# Patient Record
Sex: Male | Born: 1955
Health system: Southern US, Community
[De-identification: ages and names within clinical notes are randomized; demographics above are authoritative.]

## PROBLEM LIST (undated history)

## (undated) DIAGNOSIS — M1990 Unspecified osteoarthritis, unspecified site: Secondary | ICD-10-CM

## (undated) DIAGNOSIS — A4901 Methicillin susceptible Staphylococcus aureus infection, unspecified site: Secondary | ICD-10-CM

## (undated) DIAGNOSIS — M4800 Spinal stenosis, site unspecified: Secondary | ICD-10-CM

## (undated) DIAGNOSIS — I499 Cardiac arrhythmia, unspecified: Secondary | ICD-10-CM

## (undated) DIAGNOSIS — G35D Multiple sclerosis, unspecified: Secondary | ICD-10-CM

## (undated) DIAGNOSIS — E0590 Thyrotoxicosis, unspecified without thyrotoxic crisis or storm: Secondary | ICD-10-CM

## (undated) DIAGNOSIS — L0291 Cutaneous abscess, unspecified: Secondary | ICD-10-CM

## (undated) DIAGNOSIS — M5126 Other intervertebral disc displacement, lumbar region: Secondary | ICD-10-CM

## (undated) DIAGNOSIS — N529 Male erectile dysfunction, unspecified: Secondary | ICD-10-CM

## (undated) DIAGNOSIS — G35 Multiple sclerosis: Secondary | ICD-10-CM

## (undated) HISTORY — DX: Male erectile dysfunction, unspecified: N52.9

## (undated) HISTORY — DX: Multiple sclerosis, unspecified: G35.D

## (undated) HISTORY — PX: TONSILLECTOMY: SUR1361

## (undated) HISTORY — DX: Cutaneous abscess, unspecified: L02.91

## (undated) HISTORY — DX: Multiple sclerosis: G35

## (undated) HISTORY — DX: Methicillin susceptible Staphylococcus aureus infection, unspecified site: A49.01

---
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
## 2001-02-06 ENCOUNTER — Encounter: Payer: Self-pay | Admitting: Emergency Medicine

## 2001-02-06 ENCOUNTER — Emergency Department (HOSPITAL_COMMUNITY): Admission: EM | Admit: 2001-02-06 | Discharge: 2001-02-06 | Payer: Self-pay | Admitting: Emergency Medicine

## 2001-08-07 ENCOUNTER — Encounter: Payer: Self-pay | Admitting: Family Medicine

## 2001-08-07 ENCOUNTER — Encounter: Admission: RE | Admit: 2001-08-07 | Discharge: 2001-08-07 | Payer: Self-pay | Admitting: Family Medicine

## 2010-07-14 ENCOUNTER — Emergency Department (HOSPITAL_COMMUNITY)
Admission: EM | Admit: 2010-07-14 | Discharge: 2010-07-14 | Payer: Self-pay | Source: Home / Self Care | Admitting: Emergency Medicine

## 2011-05-16 ENCOUNTER — Encounter (INDEPENDENT_AMBULATORY_CARE_PROVIDER_SITE_OTHER): Payer: Self-pay | Admitting: General Surgery

## 2011-05-22 ENCOUNTER — Ambulatory Visit (INDEPENDENT_AMBULATORY_CARE_PROVIDER_SITE_OTHER): Payer: BC Managed Care – PPO | Admitting: General Surgery

## 2011-05-22 ENCOUNTER — Encounter (INDEPENDENT_AMBULATORY_CARE_PROVIDER_SITE_OTHER): Payer: Self-pay | Admitting: General Surgery

## 2011-05-22 VITALS — BP 148/90 | HR 92 | Temp 98.2°F | Resp 18 | Ht 74.0 in | Wt 246.6 lb

## 2011-05-22 DIAGNOSIS — L02219 Cutaneous abscess of trunk, unspecified: Secondary | ICD-10-CM

## 2011-05-22 DIAGNOSIS — L02214 Cutaneous abscess of groin: Secondary | ICD-10-CM

## 2011-05-22 NOTE — Patient Instructions (Signed)
May shower. Clean area with soap (Dove) and water, then pat dry, then perform dressing care  Keep skin separated with corner of gauze & then cover with gauze. Changed at least daily.   Call for Temp>101.5, worsening redness or pain

## 2011-05-22 NOTE — Progress Notes (Signed)
Chief complaint: I'm here to have my abscess checked  History of present illness: 55 year old Caucasian male with multiple sclerosis comes in to have his right groin abscess rechecked. The patient states that he developed swelling, redness, and pain in his right groin last Monday evening. He went to his primary care physician's office last Tuesday and was told he had an ingrown hair which caused an abscess. He underwent incision and drainage in the office that day. He was placed on 2 weeks of doxycycline.  He states that it is still draining, mainly bloody fluid. He denies any fevers or chills in the past few days. He states that the pain in the area has improved. He states that the redness has gone down. Past Medical History  Diagnosis Date  . Multiple sclerosis   . Low testosterone   . Abscess     groin  . Erectile dysfunction   . Staph aureus infection    Past Surgical History  Procedure Date  . Finger surgery 1964  . Mass excision 1960  . Tonsillectomy    Allergies  Allergen Reactions  . Ancef (Cefazolin Sodium)    Current outpatient prescriptions:baclofen (LIORESAL) 10 MG tablet, Take 10 mg by mouth 3 (three) times daily.  , Disp: , Rfl: ;  doxycycline (VIBRA-TABS) 100 MG tablet, BID times 48H., Disp: , Rfl: ;  glatiramer (COPAXONE) 20 MG/ML injection, Inject 20 mg into the skin daily.  , Disp: , Rfl: ;  naproxen sodium (ANAPROX) 220 MG tablet, Take 440 mg by mouth 2 (two) times daily with a meal.  , Disp: , Rfl:  NUVIGIL 150 MG tablet, daily., Disp: , Rfl:   History  Substance Use Topics  . Smoking status: Never Smoker   . Smokeless tobacco: Not on file  . Alcohol Use: Yes   Family History  Problem Relation Age of Onset  . Diabetes Father    ROS: 8 point ROS performed and All systems negative except for what is mentioned in HPI  PE: BP 148/90  Pulse 92  Temp(Src) 98.2 F (36.8 C) (Temporal)  Resp 18  Ht 6\' 2"  (1.88 m)  Wt 246 lb 9.6 oz (111.857 kg)  BMI 31.66  kg/m2  Gen.-well-developed, well-nourished obese Caucasian male in no apparent distress Pulmonary-lungs are clear Cardiac-regular, rate and rhythm Abdomen-soft, nontender, nondistended Skin-right groin reveals a 1-1/2 cm incision. I cannot express any drainage. There is a fair amount of induration extending laterally approximately 5 cm x 3 cm from the incision. There is some trace cellulitis. There is no fluctuance. I probed the cavity with a Q-tip. It tracked for about 4 cm laterally. There does not appear to be any undrained fluid collection.  Assessment and plan: 55 year old Caucasian male with a history of multiple sclerosis status post incision and drainage of a right groin abscess.  It appears adequately drain for now. However there is still a fair amount of induration. I told the patient to finish his antibiotics which is about one week to go.  We discussed wound care instructions. I told the patient to wick the wound with a corner of a gauze to keep the skin separated.  He was given call for instructions.  I'll see him in 2 weeks.  Mary Sella. Andrey Campanile, MD, FACS General, Bariatric, & Minimally Invasive Surgery [facility], [place]

## 2011-06-08 ENCOUNTER — Ambulatory Visit (INDEPENDENT_AMBULATORY_CARE_PROVIDER_SITE_OTHER): Payer: BC Managed Care – PPO | Admitting: General Surgery

## 2011-06-08 ENCOUNTER — Encounter (INDEPENDENT_AMBULATORY_CARE_PROVIDER_SITE_OTHER): Payer: Self-pay | Admitting: General Surgery

## 2011-06-08 VITALS — BP 128/90 | HR 64 | Temp 97.4°F | Resp 20 | Ht 74.0 in | Wt 242.5 lb

## 2011-06-08 DIAGNOSIS — Z5189 Encounter for other specified aftercare: Secondary | ICD-10-CM

## 2011-06-08 DIAGNOSIS — G35D Multiple sclerosis, unspecified: Secondary | ICD-10-CM

## 2011-06-08 DIAGNOSIS — G35 Multiple sclerosis: Secondary | ICD-10-CM | POA: Insufficient documentation

## 2011-06-08 NOTE — Progress Notes (Signed)
Chief complaint: I'm here to have my abscess re-checked  History of present illness: 55 year old Caucasian male with multiple sclerosis comes in to have his right groin abscess rechecked. I last saw him on Nov 29. He has finished his antibiotics. He denies any drainage or redness or fevers or chills.  It is a little tender with certain movments. .   Past Medical History  Diagnosis Date  . Multiple sclerosis   . Low testosterone   . Abscess     groin  . Erectile dysfunction   . Staph aureus infection    Past Surgical History  Procedure Date  . Finger surgery 1964  . Mass excision 1960  . Tonsillectomy    Allergies  Allergen Reactions  . Ancef (Cefazolin Sodium)    Current outpatient prescriptions:baclofen (LIORESAL) 10 MG tablet, Take 10 mg by mouth 3 (three) times daily.  , Disp: , Rfl: ;  glatiramer (COPAXONE) 20 MG/ML injection, Inject 20 mg into the skin daily.  , Disp: , Rfl: ;  naproxen sodium (ANAPROX) 220 MG tablet, Take 440 mg by mouth 2 (two) times daily with a meal.  , Disp: , Rfl: ;  NUVIGIL 150 MG tablet, daily., Disp: , Rfl:   History  Substance Use Topics  . Smoking status: Never Smoker   . Smokeless tobacco: Never Used  . Alcohol Use: Yes   Family History  Problem Relation Age of Onset  . Diabetes Father    ROS: 8 point ROS performed and All systems negative except for what is mentioned in HPI  PE: BP 128/90  Pulse 64  Temp(Src) 97.4 F (36.3 C) (Temporal)  Resp 20  Ht 6\' 2"  (1.88 m)  Wt 242 lb 8 oz (109.997 kg)  BMI 31.14 kg/m2  Gen.-well-developed, well-nourished obese Caucasian male in no apparent distress Pulmonary-lungs are clear Cardiac-regular, rate and rhythm Abdomen-soft, nontender, nondistended Skin-right groin reveals a  Healed 1-1/2 cm incision. I cannot express any drainage. There is a little amount of induration extending laterally approximately 1cm from the incision. no cellulitis. There is no fluctuance.  Assessment and  plan: 55 year old Caucasian male with a history of multiple sclerosis status post incision and drainage of a right groin abscess.  It appears to have resolved.   F/u PRN  Mary Sella. Andrey Campanile, MD, FACS General, Bariatric, & Minimally Invasive Surgery [facility], [place]

## 2015-09-16 DIAGNOSIS — R208 Other disturbances of skin sensation: Secondary | ICD-10-CM | POA: Insufficient documentation

## 2015-09-16 DIAGNOSIS — R2681 Unsteadiness on feet: Secondary | ICD-10-CM | POA: Insufficient documentation

## 2015-09-16 DIAGNOSIS — R252 Cramp and spasm: Secondary | ICD-10-CM | POA: Insufficient documentation

## 2015-10-06 DIAGNOSIS — M50222 Other cervical disc displacement at C5-C6 level: Secondary | ICD-10-CM | POA: Insufficient documentation

## 2015-10-25 DIAGNOSIS — M545 Low back pain, unspecified: Secondary | ICD-10-CM | POA: Insufficient documentation

## 2015-11-09 DIAGNOSIS — M5127 Other intervertebral disc displacement, lumbosacral region: Secondary | ICD-10-CM | POA: Insufficient documentation

## 2015-12-07 ENCOUNTER — Telehealth: Payer: Self-pay | Admitting: Gastroenterology

## 2015-12-07 NOTE — Telephone Encounter (Signed)
Please call patient for colonoscopy screening. He is being referred by Dr [name] at [facility]. They are in the process of faxing over patients records. Their phone number, if needed, is [phone]

## 2015-12-09 NOTE — Telephone Encounter (Signed)
LVM for pt to return my call.

## 2015-12-16 NOTE — Telephone Encounter (Signed)
Left vm again for pt to return my call. Mailed letter.  

## 2016-05-22 ENCOUNTER — Telehealth: Payer: Self-pay | Admitting: Gastroenterology

## 2016-05-22 NOTE — Telephone Encounter (Signed)
[phone] colonoscopy

## 2016-06-04 NOTE — Telephone Encounter (Signed)
Tried contacting pt to schedule colonoscopy on 06/01/16. No vm available to leave message.

## 2016-06-22 ENCOUNTER — Other Ambulatory Visit: Payer: Self-pay

## 2016-06-22 ENCOUNTER — Telehealth: Payer: Self-pay

## 2016-06-22 NOTE — Telephone Encounter (Signed)
Gastroenterology Pre-Procedure Review  Request Date:  Requesting Physician: Dr.   PATIENT REVIEW QUESTIONS: The patient responded to the following health history questions as indicated:    1. Are you having any GI issues? no 2. Do you have a personal history of Polyps? no 3. Do you have a family history of Colon Cancer or Polyps? no 4. Diabetes Mellitus? no 5. Joint replacements in the past 12 months?no 6. Major health problems in the past 3 months?no 7. Any artificial heart valves, MVP, or defibrillator?no    MEDICATIONS & ALLERGIES:    Patient reports the following regarding taking any anticoagulation/antiplatelet therapy:   Plavix, Coumadin, Eliquis, Xarelto, Lovenox, Pradaxa, Brilinta, or Effient? no Aspirin? no  Patient confirms/reports the following medications:  Current Outpatient Prescriptions  Medication Sig Dispense Refill  . baclofen (LIORESAL) 10 MG tablet Take 10 mg by mouth 3 (three) times daily.      Marland Kitchen glatiramer (COPAXONE) 20 MG/ML injection Inject 20 mg into the skin daily.      . naproxen sodium (ANAPROX) 220 MG tablet Take 440 mg by mouth 2 (two) times daily with a meal.      . NUVIGIL 150 MG tablet daily.     No current facility-administered medications for this visit.     Patient confirms/reports the following allergies:  Allergies  Allergen Reactions  . Ancef [Cefazolin Sodium]     No orders of the defined types were placed in this encounter.   AUTHORIZATION INFORMATION Primary Insurance: 1D#: Group #:  Secondary Insurance: 1D#: Group #:  SCHEDULE INFORMATION: Date: 07/04/16 Time: Location: [place]

## 2016-06-22 NOTE — Telephone Encounter (Signed)
Pt scheduled for a screening colonoscopy at [facility] on 07/04/16 with Wohl. Please precert.

## 2016-06-26 ENCOUNTER — Telehealth: Payer: Self-pay

## 2016-06-26 NOTE — Telephone Encounter (Signed)
Your Notification/Prior Authorization was received on 06/26/2016 and will be processed but a Notification/Prior Authorization Number could not be assigned at this time. Please do not resubmit this notification/prior authorization. Please print this page for your records.

## 2016-06-27 ENCOUNTER — Encounter: Payer: Self-pay | Admitting: *Deleted

## 2016-07-02 ENCOUNTER — Telehealth: Payer: Self-pay | Admitting: Gastroenterology

## 2016-07-02 NOTE — Telephone Encounter (Signed)
Has colonoscopy Wed but hasn't receive any instruction or RX. Please call patient

## 2016-07-02 NOTE — Telephone Encounter (Signed)
The notification/prior authorization case information was transmitted on 07/02/2016 at 10:30 AM CST. The notification/prior authorization reference number is [id number]. Please print this page for your records.  Your Notification/Prior Authorization submission has been Approved and no further action is required for this request. Please note that it may take a few days for the procedure coverage status to be updated and viewable via the Notification/Prior Authorization Status transaction on this website.

## 2016-07-03 NOTE — Discharge Instructions (Signed)

## 2016-07-04 ENCOUNTER — Ambulatory Visit
Admission: RE | Admit: 2016-07-04 | Discharge: 2016-07-04 | Disposition: A | Discharge status: Home / Self Care | Payer: Medicare Other | Source: Ambulatory Visit | Attending: Gastroenterology | Admitting: Gastroenterology

## 2016-07-04 ENCOUNTER — Encounter
Admission: RE | Disposition: A | Discharge status: Home / Self Care | Payer: Self-pay | Source: Ambulatory Visit | Attending: Gastroenterology

## 2016-07-04 ENCOUNTER — Ambulatory Visit: Payer: Medicare Other | Admitting: Anesthesiology

## 2016-07-04 DIAGNOSIS — Z1211 Encounter for screening for malignant neoplasm of colon: Secondary | ICD-10-CM | POA: Diagnosis not present

## 2016-07-04 DIAGNOSIS — D122 Benign neoplasm of ascending colon: Secondary | ICD-10-CM

## 2016-07-04 DIAGNOSIS — K635 Polyp of colon: Secondary | ICD-10-CM

## 2016-07-04 DIAGNOSIS — Z79899 Other long term (current) drug therapy: Secondary | ICD-10-CM | POA: Diagnosis not present

## 2016-07-04 DIAGNOSIS — D125 Benign neoplasm of sigmoid colon: Secondary | ICD-10-CM | POA: Insufficient documentation

## 2016-07-04 DIAGNOSIS — Z87891 Personal history of nicotine dependence: Secondary | ICD-10-CM | POA: Insufficient documentation

## 2016-07-04 DIAGNOSIS — G35 Multiple sclerosis: Secondary | ICD-10-CM | POA: Insufficient documentation

## 2016-07-04 DIAGNOSIS — K640 First degree hemorrhoids: Secondary | ICD-10-CM | POA: Diagnosis not present

## 2016-07-04 DIAGNOSIS — M1990 Unspecified osteoarthritis, unspecified site: Secondary | ICD-10-CM | POA: Insufficient documentation

## 2016-07-04 DIAGNOSIS — K621 Rectal polyp: Secondary | ICD-10-CM

## 2016-07-04 HISTORY — DX: Unspecified osteoarthritis, unspecified site: M19.90

## 2016-07-04 HISTORY — PX: POLYPECTOMY: SHX5525

## 2016-07-04 HISTORY — DX: Other intervertebral disc displacement, lumbar region: M51.26

## 2016-07-04 HISTORY — PX: COLONOSCOPY WITH PROPOFOL: SHX5780

## 2016-07-04 SURGERY — COLONOSCOPY WITH PROPOFOL
Anesthesia: Monitor Anesthesia Care | Wound class: Contaminated

## 2016-07-04 MED ORDER — SIMETHICONE 40 MG/0.6ML PO SUSP
ORAL | Status: DC | PRN
  Administered 2016-07-04: 08:00:00

## 2016-07-04 MED ORDER — LACTATED RINGERS IV SOLN
INTRAVENOUS | Status: DC
  Administered 2016-07-04: 08:00:00 via INTRAVENOUS

## 2016-07-04 MED ORDER — LIDOCAINE HCL (CARDIAC) 20 MG/ML IV SOLN
INTRAVENOUS | Status: DC | PRN
  Administered 2016-07-04: 50 mg via INTRAVENOUS

## 2016-07-04 MED ORDER — PROPOFOL 10 MG/ML IV BOLUS
INTRAVENOUS | Status: DC | PRN
  Administered 2016-07-04 (×5): 50 mg via INTRAVENOUS
  Administered 2016-07-04: 100 mg via INTRAVENOUS
  Administered 2016-07-04 (×2): 50 mg via INTRAVENOUS

## 2016-07-04 SURGICAL SUPPLY — 23 items
CANISTER SUCT 1200ML W/VALVE (MISCELLANEOUS) ×3 IMPLANT
CLIP HMST 235XBRD CATH ROT (MISCELLANEOUS) IMPLANT
CLIP RESOLUTION 360 11X235 (MISCELLANEOUS)
FCP ESCP3.2XJMB 240X2.8X (MISCELLANEOUS)
FORCEPS BIOP RAD 4 LRG CAP 4 (CUTTING FORCEPS) IMPLANT
FORCEPS BIOP RJ4 240 W/NDL (MISCELLANEOUS)
FORCEPS ESCP3.2XJMB 240X2.8X (MISCELLANEOUS) IMPLANT
GOWN CVR UNV OPN BCK APRN NK (MISCELLANEOUS) ×4 IMPLANT
GOWN ISOL THUMB LOOP REG UNIV (MISCELLANEOUS) ×2
INJECTOR VARIJECT VIN23 (MISCELLANEOUS) IMPLANT
KIT DEFENDO VALVE AND CONN (KITS) IMPLANT
KIT ENDO PROCEDURE OLY (KITS) ×3 IMPLANT
MARKER SPOT ENDO TATTOO 5ML (MISCELLANEOUS) IMPLANT
PAD GROUND ADULT SPLIT (MISCELLANEOUS) IMPLANT
PROBE APC STR FIRE (PROBE) IMPLANT
RETRIEVER NET ROTH 2.5X230 LF (MISCELLANEOUS) IMPLANT
SNARE SHORT THROW 13M SML OVAL (MISCELLANEOUS) ×3 IMPLANT
SNARE SHORT THROW 30M LRG OVAL (MISCELLANEOUS) IMPLANT
SNARE SNG USE RND 15MM (INSTRUMENTS) IMPLANT
SPOT EX ENDOSCOPIC TATTOO (MISCELLANEOUS)
TRAP ETRAP POLY (MISCELLANEOUS) ×3 IMPLANT
VARIJECT INJECTOR VIN23 (MISCELLANEOUS)
WATER STERILE IRR 250ML POUR (IV SOLUTION) ×3 IMPLANT

## 2016-07-04 NOTE — Op Note (Signed)
[facility] Gastroenterology Patient Name: Dustin Gray Procedure Date: 07/04/2016 7:46 AM MRN: [id number] Account #: [id number] Date of Birth: 07-Apr-1956 Admit Type: Outpatient Age: 61 Room: [facility] OR ROOM 01 Gender: Male Note Status: Finalized Procedure:            Colonoscopy Indications:          Screening for colorectal malignant neoplasm Providers:            [name] MD, MD Referring MD:         [name], MD (Referring MD) Medicines:            Propofol per Anesthesia Complications:        No immediate complications. Procedure:            Pre-Anesthesia Assessment:                       - Prior to the procedure, a History and Physical was                        performed, and patient medications and allergies were                        reviewed. The patient's tolerance of previous                        anesthesia was also reviewed. The risks and benefits of                        the procedure and the sedation options and risks were                        discussed with the patient. All questions were                        answered, and informed consent was obtained. Prior                        Anticoagulants: The patient has taken no previous                        anticoagulant or antiplatelet agents. ASA Grade                        Assessment: II - A patient with mild systemic disease.                        After reviewing the risks and benefits, the patient was                        deemed in satisfactory condition to undergo the                        procedure.                       After obtaining informed consent, the colonoscope was                        passed under direct vision. Throughout the procedure,  the patient's blood pressure, pulse, and oxygen                        saturations were monitored continuously. The was                        introduced through the anus and advanced to the the                cecum, identified by appendiceal orifice and ileocecal                        valve. The colonoscopy was performed without                        difficulty. The patient tolerated the procedure well.                        The quality of the bowel preparation was excellent. Findings:      The perianal and digital rectal examinations were normal.      Four sessile polyps were found in the sigmoid colon. The polyps were 4       to 7 mm in size. These polyps were removed with a cold snare. Resection       and retrieval were complete.      A 4 mm polyp was found in the ascending colon. The polyp was sessile.       The polyp was removed with a cold snare. Resection and retrieval were       complete.      A 8 mm polyp was found in the rectum. The polyp was pedunculated. The       polyp was removed with a cold snare. Resection and retrieval were       complete.      Non-bleeding internal hemorrhoids were found during retroflexion. The       hemorrhoids were Grade I (internal hemorrhoids that do not prolapse). Impression:           - Four 4 to 7 mm polyps in the sigmoid colon, removed                        with a cold snare. Resected and retrieved.                       - One 4 mm polyp in the ascending colon, removed with a                        cold snare. Resected and retrieved.                       - One 8 mm polyp in the rectum, removed with a cold                        snare. Resected and retrieved.                       - Non-bleeding internal hemorrhoids. Recommendation:       - Discharge patient to home.                       - Resume previous diet.                       -  Continue present medications.                       - Await pathology results.                       - Repeat colonoscopy in 5 years if polyp adenoma and 10                        years if hyperplastic Procedure Code(s):    --- Professional ---                       [phone], Colonoscopy, flexible;  with removal of tumor(s),                        polyp(s), or other lesion(s) by snare technique Diagnosis Code(s):    --- Professional ---                       Z12.11, Encounter for screening for malignant neoplasm                        of colon                       D12.5, Benign neoplasm of sigmoid colon                       D12.2, Benign neoplasm of ascending colon                       K62.1, Rectal polyp CPT copyright 2016 American Medical Association. All rights reserved. The codes documented in this report are preliminary and upon coder review may  be revised to meet current compliance requirements. [name] MD, MD 07/04/2016 8:23:30 AM This report has been signed electronically. Number of Addenda: 0 Note Initiated On: 07/04/2016 7:46 AM Scope Withdrawal Time: 0 hours 10 minutes 15 seconds  Total Procedure Duration: 0 hours 16 minutes 29 seconds       [facility]

## 2016-07-04 NOTE — Transfer of Care (Signed)
Immediate Anesthesia Transfer of Care Note  Patient: Dustin Gray  Procedure(s) Performed: Procedure(s): COLONOSCOPY WITH PROPOFOL (N/A) POLYPECTOMY  Patient Location: PACU  Anesthesia Type: MAC  Level of Consciousness: awake, alert  and patient cooperative  Airway and Oxygen Therapy: Patient Spontanous Breathing and Patient connected to supplemental oxygen  Post-op Assessment: Post-op Vital signs reviewed, Patient's Cardiovascular Status Stable, Respiratory Function Stable, Patent Airway and No signs of Nausea or vomiting  Post-op Vital Signs: Reviewed and stable  Complications: No apparent anesthesia complications

## 2016-07-04 NOTE — Anesthesia Postprocedure Evaluation (Signed)
Anesthesia Post Note  Patient: Dustin Gray  Procedure(s) Performed: Procedure(s) (LRB): COLONOSCOPY WITH PROPOFOL (N/A) POLYPECTOMY  Patient location during evaluation: PACU Anesthesia Type: MAC Level of consciousness: awake and alert Pain management: pain level controlled Vital Signs Assessment: post-procedure vital signs reviewed and stable Respiratory status: spontaneous breathing, nonlabored ventilation, respiratory function stable and patient connected to nasal cannula oxygen Cardiovascular status: stable and blood pressure returned to baseline Anesthetic complications: no    Marshell Levan

## 2016-07-04 NOTE — Anesthesia Preprocedure Evaluation (Signed)
Anesthesia Evaluation  Patient identified by MRN, date of birth, ID band Patient awake    Airway Mallampati: II  TM Distance: >3 FB Neck ROM: Full    Dental   Pulmonary former smoker,    Pulmonary exam normal        Cardiovascular Normal cardiovascular exam     Neuro/Psych Well controlled MS    GI/Hepatic   Endo/Other    Renal/GU      Musculoskeletal   Abdominal   Peds  Hematology   Anesthesia Other Findings   Reproductive/Obstetrics                             Anesthesia Physical Anesthesia Plan  ASA: II  Anesthesia Plan: MAC   Post-op Pain Management:    Induction: Intravenous  Airway Management Planned:   Additional Equipment:   Intra-op Plan:   Post-operative Plan:   Informed Consent: I have reviewed the patients History and Physical, chart, labs and discussed the procedure including the risks, benefits and alternatives for the proposed anesthesia with the patient or authorized representative who has indicated his/her understanding and acceptance.     Plan Discussed with: CRNA  Anesthesia Plan Comments:         Anesthesia Quick Evaluation

## 2016-07-04 NOTE — H&P (Signed)
  [name], MD [address] [place], [place] [address] Phone: [phone] Fax : [phone]  Primary Care Physician:  Simona Huh, MD Primary Gastroenterologist:  Dr. Allen Norris  Pre-Procedure History & Physical: HPI:  Dustin Gray is a 61 y.o. male is here for a screening colonoscopy.   Past Medical History:  Diagnosis Date  . Abscess    groin  . Arthritis    lower spine  . Erectile dysfunction   . Low testosterone   . Lumbar herniated disc    L5  . Multiple sclerosis ([place])   . Staph aureus infection     Past Surgical History:  Procedure Laterality Date  . [place]  . MASS EXCISION  1960  . TONSILLECTOMY      Prior to Admission medications   Medication Sig Start Date End Date Taking? Authorizing Provider  baclofen (LIORESAL) 10 MG tablet Take 10 mg by mouth 3 (three) times daily.     Yes Historical Provider, MD  Dimethyl Fumarate (TECFIDERA) 240 MG CPDR Take by mouth 2 (two) times daily.   Yes Historical Provider, MD  gabapentin (NEURONTIN) 300 MG capsule Take 300 mg by mouth 3 (three) times daily. 300 mg Am and midday.  600 mg PM   Yes Historical Provider, MD  Multiple Vitamin (MULTIVITAMIN) tablet Take 1 tablet by mouth daily.   Yes Historical Provider, MD  naproxen sodium (ANAPROX) 220 MG tablet Take 440 mg by mouth 2 (two) times daily with a meal.     Yes Historical Provider, MD    Allergies as of 06/22/2016 - Review Complete 06/08/2011  Allergen Reaction Noted  . Ancef [cefazolin sodium]  05/16/2011    Family History  Problem Relation Age of Onset  . Diabetes Father     Social History   Social History  . Marital status: Legally Separated    Spouse name: N/A  . Number of children: N/A  . Years of education: N/A   Occupational History  . Not on file.   Social History Main Topics  . Smoking status: Former Research scientist (life sciences)  . Smokeless tobacco: Never Used     Comment: smoked as teenager  . Alcohol use Yes     Comment: 2  drinks/month  . Drug use: No  . Sexual activity: Not on file   Other Topics Concern  . Not on file   Social History Narrative  . No narrative on file    Review of Systems: See HPI, otherwise negative ROS  Physical Exam: BP (!) 154/96   Pulse (!) 111   Temp 98 F (36.7 C) (Tympanic)   Resp 18   Ht 6\' 2"  (1.88 m)   Wt 279 lb (126.6 kg)   SpO2 97%   BMI 35.82 kg/m  General:   Alert,  pleasant and cooperative in NAD Head:  Normocephalic and atraumatic. Neck:  Supple; no masses or thyromegaly. Lungs:  Clear throughout to auscultation.    Heart:  Regular rate and rhythm. Abdomen:  Soft, nontender and nondistended. Normal bowel sounds, without guarding, and without rebound.   Neurologic:  Alert and  oriented x4;  grossly normal neurologically.  Impression/Plan: Dustin Gray is now here to undergo a screening colonoscopy.  Risks, benefits, and alternatives regarding colonoscopy have been reviewed with the patient.  Questions have been answered.  All parties agreeable.

## 2016-07-04 NOTE — Anesthesia Procedure Notes (Signed)
Procedure Name: MAC Performed by: Yaden Seith Pre-anesthesia Checklist: Patient identified, Emergency Drugs available, Suction available, Timeout performed and Patient being monitored Patient Re-evaluated:Patient Re-evaluated prior to inductionOxygen Delivery Method: Nasal cannula Placement Confirmation: positive ETCO2     

## 2016-07-05 ENCOUNTER — Encounter: Payer: Self-pay | Admitting: Gastroenterology

## 2016-07-06 ENCOUNTER — Encounter: Payer: Self-pay | Admitting: Gastroenterology

## 2016-07-08 ENCOUNTER — Encounter: Payer: Self-pay | Admitting: Gastroenterology

## 2016-09-12 ENCOUNTER — Other Ambulatory Visit: Payer: Self-pay

## 2016-12-05 DIAGNOSIS — Z79899 Other long term (current) drug therapy: Secondary | ICD-10-CM | POA: Insufficient documentation

## 2017-12-12 DIAGNOSIS — H938X1 Other specified disorders of right ear: Secondary | ICD-10-CM | POA: Insufficient documentation

## 2017-12-12 DIAGNOSIS — H93291 Other abnormal auditory perceptions, right ear: Secondary | ICD-10-CM | POA: Insufficient documentation

## 2017-12-27 ENCOUNTER — Other Ambulatory Visit: Payer: Self-pay | Admitting: Student

## 2017-12-27 DIAGNOSIS — G8929 Other chronic pain: Secondary | ICD-10-CM

## 2017-12-27 DIAGNOSIS — M5442 Lumbago with sciatica, left side: Principal | ICD-10-CM

## 2018-01-09 ENCOUNTER — Other Ambulatory Visit: Payer: Self-pay | Admitting: Student

## 2018-01-09 ENCOUNTER — Ambulatory Visit
Admission: RE | Admit: 2018-01-09 | Discharge: 2018-01-09 | Disposition: A | Discharge status: Home / Self Care | Payer: Medicare Other | Source: Ambulatory Visit | Attending: Student | Admitting: Student

## 2018-01-09 DIAGNOSIS — M5136 Other intervertebral disc degeneration, lumbar region: Secondary | ICD-10-CM | POA: Diagnosis not present

## 2018-01-09 DIAGNOSIS — M5440 Lumbago with sciatica, unspecified side: Secondary | ICD-10-CM

## 2018-01-09 DIAGNOSIS — M4807 Spinal stenosis, lumbosacral region: Secondary | ICD-10-CM | POA: Insufficient documentation

## 2018-01-09 DIAGNOSIS — M48061 Spinal stenosis, lumbar region without neurogenic claudication: Secondary | ICD-10-CM | POA: Diagnosis not present

## 2018-01-09 DIAGNOSIS — M5126 Other intervertebral disc displacement, lumbar region: Secondary | ICD-10-CM | POA: Diagnosis not present

## 2018-01-09 DIAGNOSIS — M5442 Lumbago with sciatica, left side: Secondary | ICD-10-CM | POA: Insufficient documentation

## 2018-01-09 DIAGNOSIS — G8929 Other chronic pain: Secondary | ICD-10-CM | POA: Insufficient documentation

## 2018-01-09 DIAGNOSIS — Z8669 Personal history of other diseases of the nervous system and sense organs: Principal | ICD-10-CM

## 2018-01-09 DIAGNOSIS — G35 Multiple sclerosis: Secondary | ICD-10-CM

## 2018-01-09 DIAGNOSIS — G959 Disease of spinal cord, unspecified: Secondary | ICD-10-CM

## 2018-01-23 ENCOUNTER — Ambulatory Visit
Admission: RE | Admit: 2018-01-23 | Discharge: 2018-01-23 | Disposition: A | Discharge status: Home / Self Care | Payer: Medicare Other | Source: Ambulatory Visit | Attending: Student | Admitting: Student

## 2018-01-23 DIAGNOSIS — G959 Disease of spinal cord, unspecified: Secondary | ICD-10-CM | POA: Insufficient documentation

## 2018-01-23 DIAGNOSIS — M4802 Spinal stenosis, cervical region: Secondary | ICD-10-CM | POA: Insufficient documentation

## 2018-01-23 DIAGNOSIS — G8929 Other chronic pain: Secondary | ICD-10-CM | POA: Diagnosis present

## 2018-01-23 DIAGNOSIS — M545 Low back pain: Secondary | ICD-10-CM | POA: Diagnosis present

## 2018-01-23 DIAGNOSIS — Z8669 Personal history of other diseases of the nervous system and sense organs: Secondary | ICD-10-CM | POA: Insufficient documentation

## 2018-01-23 DIAGNOSIS — G35 Multiple sclerosis: Secondary | ICD-10-CM

## 2018-01-23 DIAGNOSIS — M5440 Lumbago with sciatica, unspecified side: Secondary | ICD-10-CM

## 2018-01-23 MED ORDER — GADOBENATE DIMEGLUMINE 529 MG/ML IV SOLN
20.0000 mL | Freq: Once | INTRAVENOUS | Status: AC | PRN
  Administered 2018-01-23: 10 mL via INTRAVENOUS

## 2018-07-11 ENCOUNTER — Other Ambulatory Visit: Payer: Self-pay | Admitting: Neurology

## 2018-07-11 DIAGNOSIS — G35 Multiple sclerosis: Secondary | ICD-10-CM

## 2018-07-25 ENCOUNTER — Ambulatory Visit
Admission: RE | Admit: 2018-07-25 | Discharge: 2018-07-25 | Disposition: A | Discharge status: Home / Self Care | Payer: Medicare Other | Source: Ambulatory Visit | Attending: Neurology | Admitting: Neurology

## 2018-07-25 DIAGNOSIS — G35 Multiple sclerosis: Secondary | ICD-10-CM | POA: Diagnosis present

## 2018-12-25 DIAGNOSIS — Z6836 Body mass index (BMI) 36.0-36.9, adult: Secondary | ICD-10-CM | POA: Insufficient documentation

## 2018-12-25 DIAGNOSIS — M1712 Unilateral primary osteoarthritis, left knee: Secondary | ICD-10-CM | POA: Insufficient documentation

## 2018-12-25 DIAGNOSIS — M48061 Spinal stenosis, lumbar region without neurogenic claudication: Secondary | ICD-10-CM | POA: Insufficient documentation

## 2019-07-27 IMAGING — MR MR HEAD W/O CM
11 series · 45 of 48 positions shown · non-contrast
Comparison: 08/20/2017

CLINICAL DATA: Followup multiple sclerosis

EXAM:
MRI HEAD WITHOUT CONTRAST
TECHNIQUE: Multiplanar, multiecho pulse sequences of the brain and surrounding
structures were obtained without intravenous contrast.

[Series 3: DWI · axial · 3.0mm · 1.20mm/px · z∈[-70,+102]mm · 4 of 59 slices shown (1 of 4)]
[im 1/59]
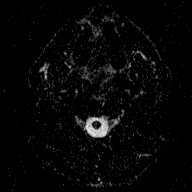
[im 20/59]
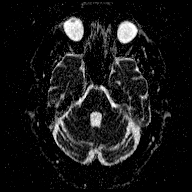
[im 39/59]
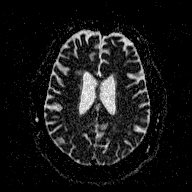
[im 59/59]
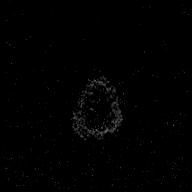

[Series 5: DWI · coronal · 3.0mm · 1.15mm/px · 4 of 53 slices shown (2 of 4)]
[im 1/53]
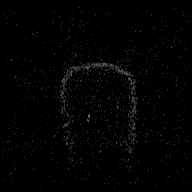
[im 18/53]
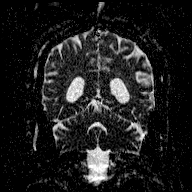
[im 35/53]
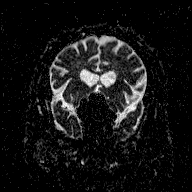
[im 53/53]
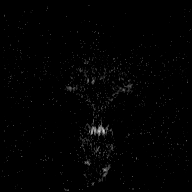

[Series 6: T1 · sagittal · 5.0mm · 0.45mm/px · 2 of 25 slices shown (1 of 2)]
[im 1/25]
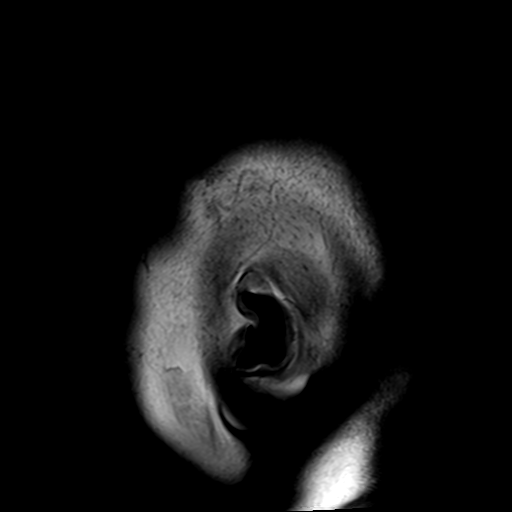
[im 25/25]
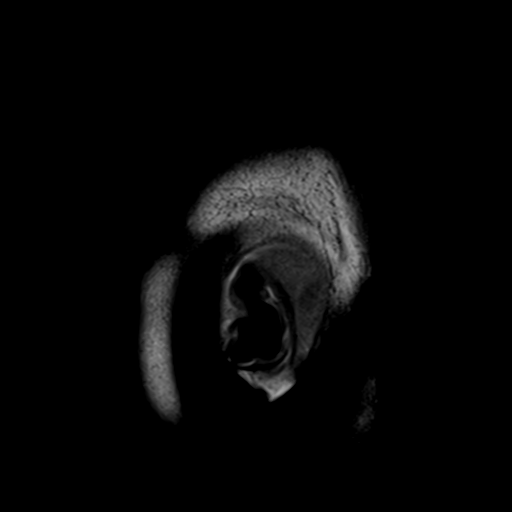

[Series 7: T2 · axial · 5.0mm · 0.72mm/px · z∈[-71,+108]mm · 2 of 27 slices shown (1 of 3)]
[im 1/27]
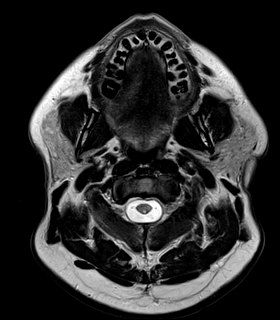
[im 27/27]
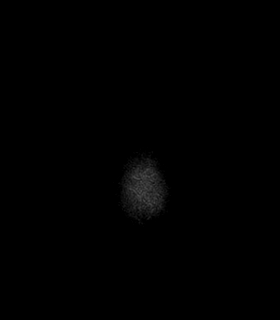

[Series 8: FLAIR · axial · 3.0mm · 0.45mm/px · z∈[-67,+104]mm · 5 of 59 slices shown (1 of 2)]
[im 1/59]
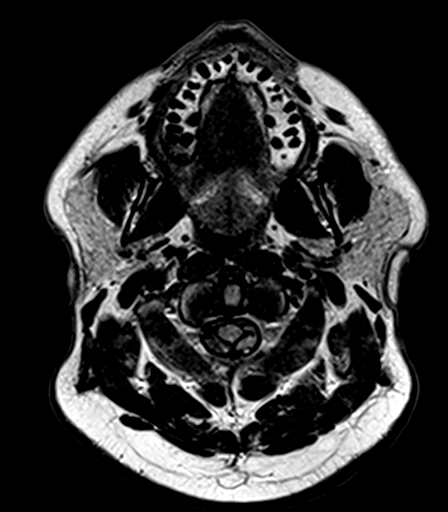
[im 15/59]
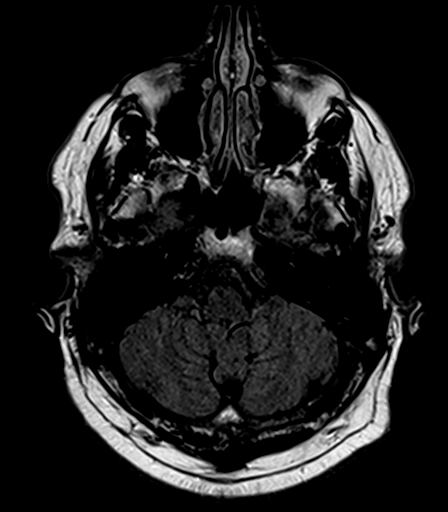
[im 30/59]
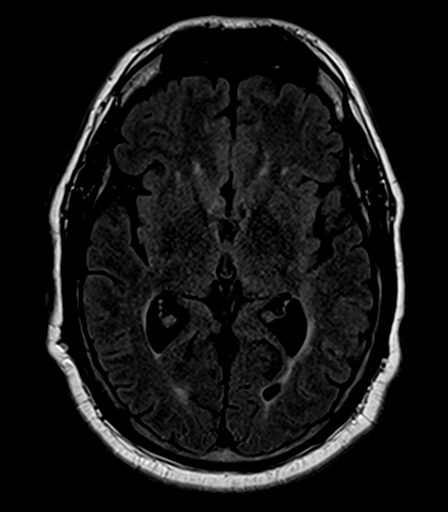
[im 44/59]
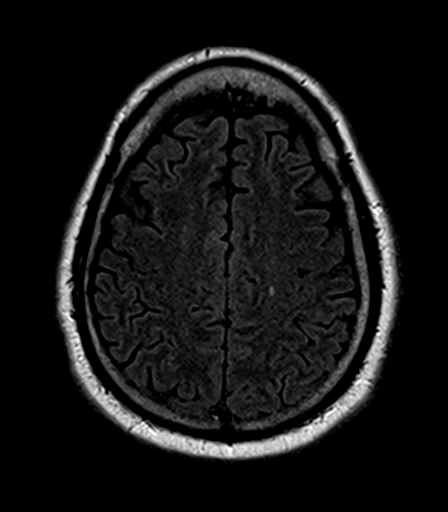
[im 59/59]
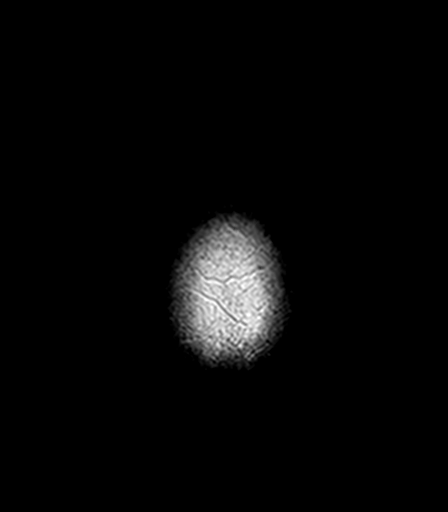

[Series 9: T2 · axial · 5.0mm · 0.72mm/px · z∈[-71,+108]mm · 2 of 27 slices shown (2 of 3)]
[im 1/27]
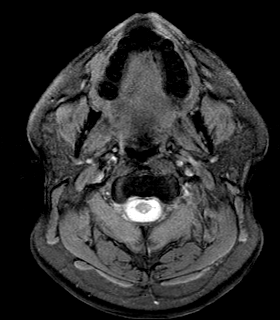
[im 27/27]
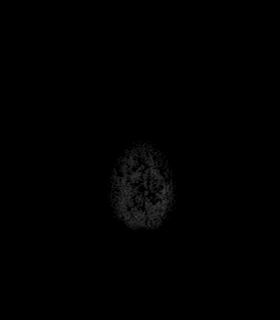

[Series 10: T1 · axial · 1.0mm · 1.00mm/px · z∈[-66,+107]mm · 11 of 176 slices shown (2 of 2)]
[im 1/176]
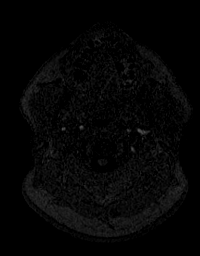
[im 14/176]
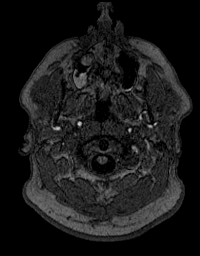
[im 27/176]
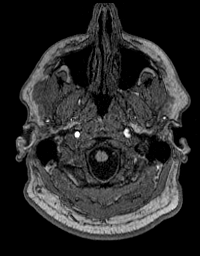
[im 41/176]
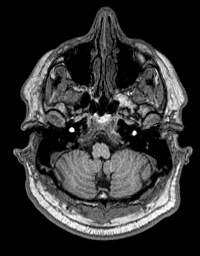
[im 54/176]
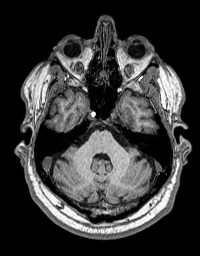
[im 68/176]
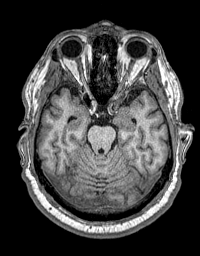
[im 81/176]
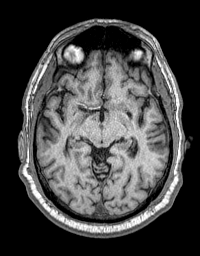
[im 95/176]
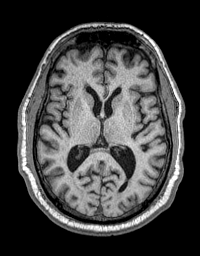
[im 122/176]
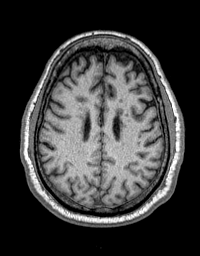
[im 149/176]
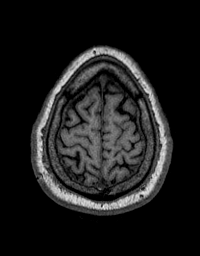
[im 176/176]
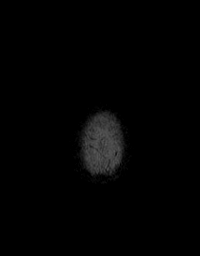

[Series 11: T2 · coronal · 5.0mm · 0.43mm/px · 3 of 33 slices shown (3 of 3)]
[im 1/33]
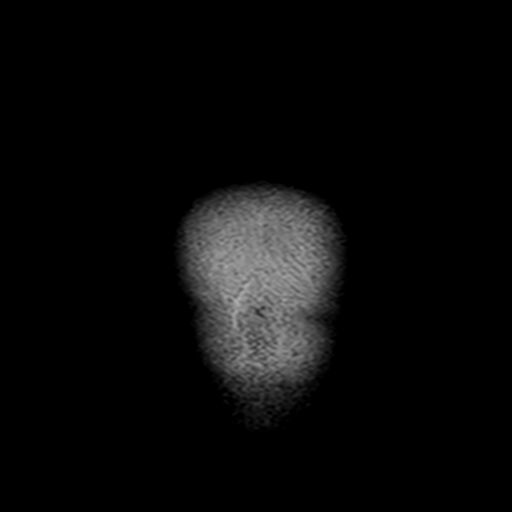
[im 17/33]
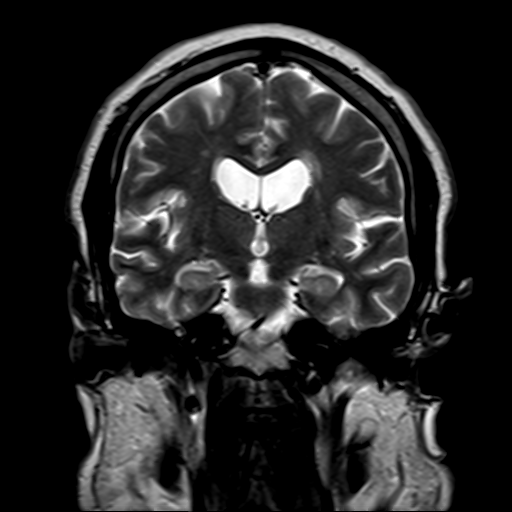
[im 33/33]
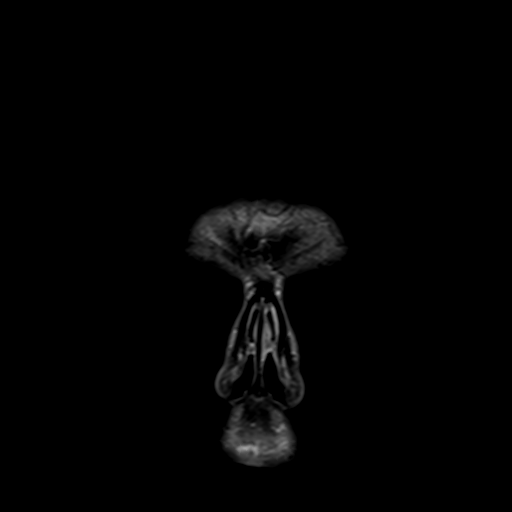

[Series 12: FLAIR · sagittal · 4.0mm · 0.45mm/px · 3 of 32 slices shown (2 of 2)]
[im 1/32]
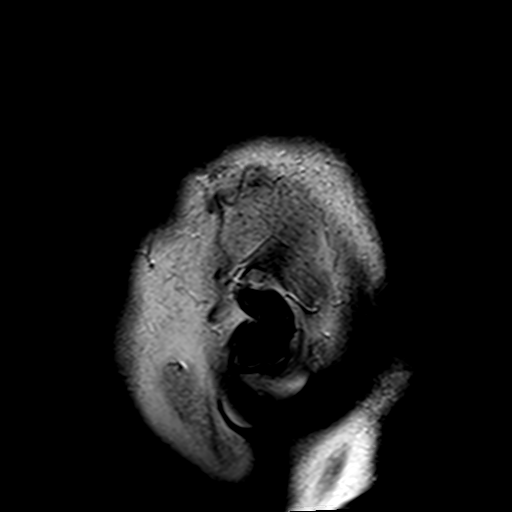
[im 16/32]
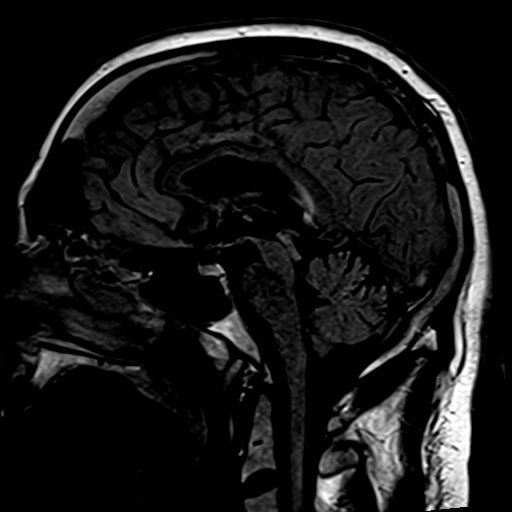
[im 32/32]
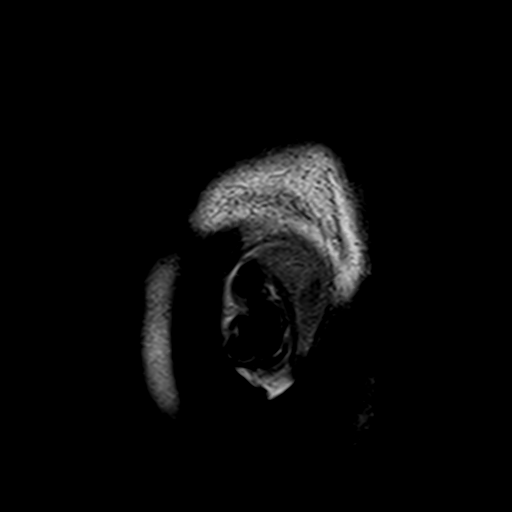

[Series 100: DWI · axial · 3.0mm · 1.20mm/px · z∈[-70,+102]mm · 5 of 59 slices shown (3 of 4)]
[im 1/59]
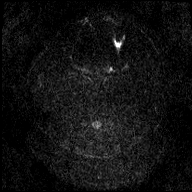
[im 15/59]
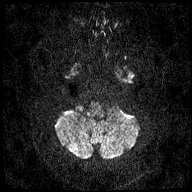
[im 30/59]
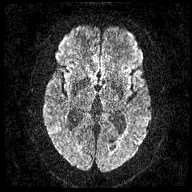
[im 44/59]
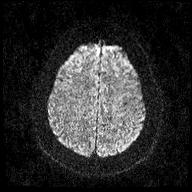
[im 59/59]
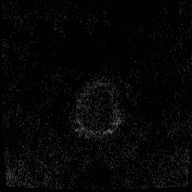

[Series 101: DWI · coronal · 3.0mm · 1.15mm/px · 4 of 53 slices shown (4 of 4)]
[im 1/53]
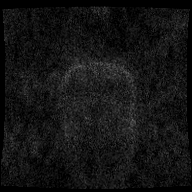
[im 18/53]
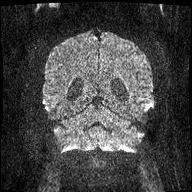
[im 35/53]
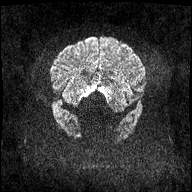
[im 53/53]
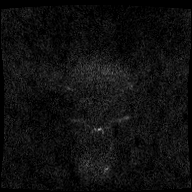

[45 of 48 positions shown; findings below may reference images not displayed]

FINDINGS: Brain: Diffusion imaging does not show any restricted diffusion. No
lesion is seen affecting the brainstem or cerebellum. Cerebral
hemispheres show numerous foci of abnormal T2 and FLAIR signal
within the deep and subcortical white matter of both hemispheres,
the vast majority of the lesions are unchanged since the previous
exam. There is a single new focus of abnormal T2 and FLAIR
subcortical white matter signal in the right posterior frontal lobe,
FLAIR axial image 36 and sagittal image 9, measuring maximal
dimension 8 mm. No evidence of mass, hemorrhage, hydrocephalus or
extra-axial collection.

Vascular: Major vessels at the base of the brain show flow.

Skull and upper cervical spine: Negative

Sinuses/Orbits: Clear/normal

Other: None
IMPRESSION: The only change since the study July 2017 is a single new
focus of T2 and FLAIR signal within the subcortical white matter of
the right posterior frontal lobe as above. Other multifocal
bilateral white matter foci consistent with MS involvement have not
changed.

## 2019-09-30 ENCOUNTER — Inpatient Hospital Stay
Admission: EM | Admit: 2019-09-30 | Discharge: 2019-10-03 | DRG: 308 | Disposition: A | Discharge status: Home / Self Care | Payer: Medicare PPO | Attending: Internal Medicine | Admitting: Internal Medicine

## 2019-09-30 ENCOUNTER — Other Ambulatory Visit: Payer: Self-pay

## 2019-09-30 ENCOUNTER — Emergency Department: Payer: Medicare PPO

## 2019-09-30 ENCOUNTER — Observation Stay: Payer: Medicare PPO

## 2019-09-30 ENCOUNTER — Encounter: Payer: Self-pay | Admitting: Emergency Medicine

## 2019-09-30 DIAGNOSIS — M5127 Other intervertebral disc displacement, lumbosacral region: Secondary | ICD-10-CM | POA: Diagnosis present

## 2019-09-30 DIAGNOSIS — R140 Abdominal distension (gaseous): Secondary | ICD-10-CM

## 2019-09-30 DIAGNOSIS — L03116 Cellulitis of left lower limb: Secondary | ICD-10-CM | POA: Diagnosis not present

## 2019-09-30 DIAGNOSIS — Z79899 Other long term (current) drug therapy: Secondary | ICD-10-CM

## 2019-09-30 DIAGNOSIS — Z20822 Contact with and (suspected) exposure to covid-19: Secondary | ICD-10-CM | POA: Diagnosis present

## 2019-09-30 DIAGNOSIS — Z87891 Personal history of nicotine dependence: Secondary | ICD-10-CM

## 2019-09-30 DIAGNOSIS — Z833 Family history of diabetes mellitus: Secondary | ICD-10-CM

## 2019-09-30 DIAGNOSIS — I4819 Other persistent atrial fibrillation: Secondary | ICD-10-CM | POA: Diagnosis not present

## 2019-09-30 DIAGNOSIS — I5031 Acute diastolic (congestive) heart failure: Secondary | ICD-10-CM | POA: Diagnosis present

## 2019-09-30 DIAGNOSIS — I272 Pulmonary hypertension, unspecified: Secondary | ICD-10-CM | POA: Diagnosis present

## 2019-09-30 DIAGNOSIS — G35 Multiple sclerosis: Secondary | ICD-10-CM | POA: Diagnosis present

## 2019-09-30 DIAGNOSIS — I4891 Unspecified atrial fibrillation: Secondary | ICD-10-CM | POA: Diagnosis not present

## 2019-09-30 DIAGNOSIS — E0500 Thyrotoxicosis with diffuse goiter without thyrotoxic crisis or storm: Secondary | ICD-10-CM

## 2019-09-30 DIAGNOSIS — E0590 Thyrotoxicosis, unspecified without thyrotoxic crisis or storm: Secondary | ICD-10-CM

## 2019-09-30 DIAGNOSIS — R600 Localized edema: Secondary | ICD-10-CM

## 2019-09-30 DIAGNOSIS — Z881 Allergy status to other antibiotic agents status: Secondary | ICD-10-CM

## 2019-09-30 LAB — CBC
HCT: 43.9 % (ref 39.0–52.0)
HCT: 44.5 % (ref 39.0–52.0)
Hemoglobin: 14.3 g/dL (ref 13.0–17.0)
Hemoglobin: 14.5 g/dL (ref 13.0–17.0)
MCH: 29.5 pg (ref 26.0–34.0)
MCH: 29.3 pg (ref 26.0–34.0)
MCHC: 32.6 g/dL (ref 30.0–36.0)
MCHC: 32.6 g/dL (ref 30.0–36.0)
MCV: 90.5 fL (ref 80.0–100.0)
MCV: 89.9 fL (ref 80.0–100.0)
Platelets: 213 K/uL (ref 150–400)
Platelets: 194 10*3/uL (ref 150–400)
RBC: 4.85 MIL/uL (ref 4.22–5.81)
RBC: 4.95 MIL/uL (ref 4.22–5.81)
RDW: 12.1 % (ref 11.5–15.5)
RDW: 12.2 % (ref 11.5–15.5)
WBC: 5.6 K/uL (ref 4.0–10.5)
WBC: 5.3 10*3/uL (ref 4.0–10.5)
nRBC: 0 % (ref 0.0–0.2)
nRBC: 0 % (ref 0.0–0.2)

## 2019-09-30 LAB — CREATININE, SERUM
Creatinine, Ser: 0.84 mg/dL (ref 0.61–1.24)
GFR calc Af Amer: >60 mL/min (ref >60)
GFR calc non Af Amer: >60 mL/min (ref >60)

## 2019-09-30 LAB — BASIC METABOLIC PANEL WITH GFR
Anion gap: 6 (ref 5–15)
BUN: 8 mg/dL (ref 8–23)
CO2: 26 mmol/L (ref 22–32)
Calcium: 9.3 mg/dL (ref 8.9–10.3)
Chloride: 109 mmol/L (ref 98–111)
Creatinine, Ser: 0.93 mg/dL (ref 0.61–1.24)
GFR calc Af Amer: >60 mL/min
GFR calc non Af Amer: >60 mL/min
Glucose, Bld: 128 mg/dL — ABNORMAL HIGH (ref 70–99)
Potassium: 4.5 mmol/L (ref 3.5–5.1)
Sodium: 141 mmol/L (ref 135–145)

## 2019-09-30 LAB — BRAIN NATRIURETIC PEPTIDE: B Natriuretic Peptide: 200 pg/mL — ABNORMAL HIGH (ref 0.0–100.0)

## 2019-09-30 LAB — URINALYSIS, ROUTINE W REFLEX MICROSCOPIC
Bilirubin Urine: NEGATIVE
Glucose, UA: NEGATIVE mg/dL
Hgb urine dipstick: NEGATIVE
Ketones, ur: 5 mg/dL — AB
Leukocytes,Ua: NEGATIVE
Nitrite: NEGATIVE
Protein, ur: NEGATIVE mg/dL
Specific Gravity, Urine: 1.018 (ref 1.005–1.030)
pH: 6 (ref 5.0–8.0)

## 2019-09-30 LAB — HIV ANTIBODY (ROUTINE TESTING W REFLEX): HIV Screen 4th Generation wRfx: NONREACTIVE

## 2019-09-30 LAB — TSH: TSH: <0.01 u[IU]/mL — ABNORMAL LOW (ref 0.350–4.500)

## 2019-09-30 LAB — TROPONIN I (HIGH SENSITIVITY)
Troponin I (High Sensitivity): 7 ng/L
Troponin I (High Sensitivity): 8 ng/L

## 2019-09-30 MED ORDER — SODIUM CHLORIDE 0.9% FLUSH
3.0000 mL | Freq: Two times a day (BID) | INTRAVENOUS | Status: DC
  Administered 2019-10-01 – 2019-10-03 (×5): 3 mL via INTRAVENOUS

## 2019-09-30 MED ORDER — BACLOFEN 10 MG PO TABS
10.0000 mg | ORAL_TABLET | Freq: Three times a day (TID) | ORAL | Status: DC
  Administered 2019-09-30 – 2019-10-03 (×8): 10 mg via ORAL
  Filled 2019-09-30 (×11): qty 1

## 2019-09-30 MED ORDER — ONDANSETRON HCL 4 MG/2ML IJ SOLN
4.0000 mg | Freq: Four times a day (QID) | INTRAMUSCULAR | Status: DC | PRN
  Administered 2019-10-03: 4 mg via INTRAVENOUS
  Filled 2019-09-30: qty 2

## 2019-09-30 MED ORDER — ACETAMINOPHEN 325 MG PO TABS: 650.0000 mg | ORAL_TABLET | Freq: Four times a day (QID) | ORAL | Status: DC | PRN

## 2019-09-30 MED ORDER — DILTIAZEM HCL 25 MG/5ML IV SOLN
10.0000 mg | Freq: Once | INTRAVENOUS | Status: AC
  Administered 2019-09-30: 10 mg via INTRAVENOUS
  Filled 2019-09-30: qty 5

## 2019-09-30 MED ORDER — CLINDAMYCIN HCL 150 MG PO CAPS
300.0000 mg | ORAL_CAPSULE | Freq: Four times a day (QID) | ORAL | Status: DC
  Administered 2019-09-30 – 2019-10-03 (×12): 300 mg via ORAL
  Filled 2019-09-30 (×14): qty 2

## 2019-09-30 MED ORDER — POLYETHYLENE GLYCOL 3350 17 G PO PACK: 17.0000 g | PACK | Freq: Every day | ORAL | Status: DC | PRN

## 2019-09-30 MED ORDER — DILTIAZEM HCL 60 MG PO TABS
30.0000 mg | ORAL_TABLET | Freq: Once | ORAL | Status: AC
  Administered 2019-09-30: 30 mg via ORAL
  Filled 2019-09-30: qty 1

## 2019-09-30 MED ORDER — GABAPENTIN 300 MG PO CAPS
600.0000 mg | ORAL_CAPSULE | Freq: Every day | ORAL | Status: DC
  Administered 2019-09-30 – 2019-10-02 (×3): 600 mg via ORAL
  Filled 2019-09-30 (×3): qty 2

## 2019-09-30 MED ORDER — DIMETHYL FUMARATE 240 MG PO CPDR
DELAYED_RELEASE_CAPSULE | Freq: Two times a day (BID) | ORAL | Status: DC
  Administered 2019-09-30: 240 mg via ORAL

## 2019-09-30 MED ORDER — FUROSEMIDE 10 MG/ML IJ SOLN
40.0000 mg | Freq: Two times a day (BID) | INTRAMUSCULAR | Status: DC
  Administered 2019-10-01: 40 mg via INTRAVENOUS
  Filled 2019-09-30: qty 4

## 2019-09-30 MED ORDER — ONDANSETRON HCL 4 MG PO TABS: 4.0000 mg | ORAL_TABLET | Freq: Four times a day (QID) | ORAL | Status: DC | PRN

## 2019-09-30 MED ORDER — ACETAMINOPHEN 650 MG RE SUPP: 650.0000 mg | Freq: Four times a day (QID) | RECTAL | Status: DC | PRN

## 2019-09-30 MED ORDER — GABAPENTIN 300 MG PO CAPS: 300.0000 mg | ORAL_CAPSULE | Freq: Three times a day (TID) | ORAL | Status: DC

## 2019-09-30 MED ORDER — DILTIAZEM HCL 25 MG/5ML IV SOLN
5.0000 mg | Freq: Once | INTRAVENOUS | Status: AC
  Administered 2019-09-30: 5 mg via INTRAVENOUS
  Filled 2019-09-30: qty 5

## 2019-09-30 MED ORDER — FUROSEMIDE 10 MG/ML IJ SOLN: 40.0000 mg | Freq: Every day | INTRAMUSCULAR | Status: DC

## 2019-09-30 MED ORDER — DILTIAZEM HCL-DEXTROSE 125-5 MG/125ML-% IV SOLN (PREMIX)
5.0000 mg/h | INTRAVENOUS | Status: DC
  Administered 2019-09-30: 5 mg/h via INTRAVENOUS
  Administered 2019-10-01: 15 mg/h via INTRAVENOUS
  Filled 2019-09-30 (×2): qty 125

## 2019-09-30 MED ORDER — GABAPENTIN 300 MG PO CAPS
300.0000 mg | ORAL_CAPSULE | Freq: Every morning | ORAL | Status: DC
  Administered 2019-10-01 – 2019-10-03 (×3): 300 mg via ORAL
  Filled 2019-09-30 (×3): qty 1

## 2019-09-30 MED ORDER — ENOXAPARIN SODIUM 40 MG/0.4ML ~~LOC~~ SOLN
40.0000 mg | SUBCUTANEOUS | Status: DC
  Administered 2019-10-01 (×2): 40 mg via SUBCUTANEOUS
  Filled 2019-09-30 (×2): qty 0.4

## 2019-09-30 NOTE — Consult Note (Signed)
Cardiology Consultation:   Patient ID: MCGREGOR MOA MRN: [id number]; DOB: 28-Apr-1956  Admit date: 09/30/2019 Date of Consult: 09/30/2019  Primary Care Provider: [name], MD Primary Cardiologist:[facility]- [place] rounding Primary Electrophysiologist:  None    Patient Profile:   Dustin Gray is a 64 y.o. male with a hx of multiple sclerosis, spinal stenosis, lymphedema who is being seen today for the evaluation of atrial fibrillation at the request of Dr. Jacqualine Code.  History of Present Illness:   Dustin Gray is a 64 year old male with history of multiple sclerosis, spinal stenosis, lymphedema who presents to the hospital due to shortness of breath.  Patient has noticed shortness of breath over the past 2 weeks.  Symptoms have roughly stayed the same.  He denies chest pain.  He went to a walk-in clinic due to worsening lower extremity edema and shortness of breath.  His heart rate was noted to be elevated and patient brought to the emergency room.  He denies any history of heart disease.  Endorses palpitations which started yesterday.  In the ED, EKG showed atrial fibrillation with rapid ventricular response heart rate 178 bpm.  Troponins were within normal limits.  Patient was given IV diltiazem, and started on diltiazem drip.   Past Medical History:  Diagnosis Date  . Abscess    groin  . Arthritis    lower spine  . Erectile dysfunction   . Low testosterone   . Lumbar herniated disc    L5  . Multiple sclerosis ([name]   . Staph aureus infection     Past Surgical History:  Procedure Laterality Date  . COLONOSCOPY WITH PROPOFOL N/A 07/04/2016   Procedure: COLONOSCOPY WITH PROPOFOL;  Surgeon: [name], MD;  Location: [place];  Service: Endoscopy;  Laterality: N/A;  . [place]  . MASS EXCISION  1960  . POLYPECTOMY  07/04/2016   Procedure: POLYPECTOMY;  Surgeon: [name], MD;  Location: [place];  Service: Endoscopy;;  . TONSILLECTOMY        Home Medications:  Prior to Admission medications   Medication Sig Start Date End Date Taking? Authorizing Provider  baclofen (LIORESAL) 10 MG tablet Take 10 mg by mouth 3 (three) times daily.      [provider]  Dimethyl Fumarate (TECFIDERA) 240 MG CPDR Take by mouth 2 (two) times daily.    [provider]  gabapentin (NEURONTIN) 300 MG capsule Take 300 mg by mouth 3 (three) times daily. 300 mg Am and midday.  600 mg PM    [provider]  Multiple Vitamin (MULTIVITAMIN) tablet Take 1 tablet by mouth daily.    [provider]  naproxen sodium (ANAPROX) 220 MG tablet Take 440 mg by mouth 2 (two) times daily with a meal.      [provider]    Inpatient Medications: Scheduled Meds: . baclofen  10 mg Oral TID  . clindamycin  300 mg Oral Q6H  . Dimethyl Fumarate   Oral BID  . enoxaparin (LOVENOX) injection  40 mg Subcutaneous Q24H  . furosemide  40 mg Intravenous BID  . furosemide  40 mg Intravenous Daily  . gabapentin  300 mg Oral TID  . sodium chloride flush  3 mL Intravenous Q12H   Continuous Infusions: . diltiazem (CARDIZEM) infusion     PRN Meds: acetaminophen **OR** acetaminophen, ondansetron **OR** ondansetron (ZOFRAN) IV, polyethylene glycol  Allergies:    Allergies  Allergen Reactions  . Cephalexin Hives  . Ancef [Cefazolin Sodium]  Rash  . Cefadroxil Rash  . Cefazolin Rash    Social History:   Social History   Socioeconomic History  . Marital status: Legally Separated    Spouse name: Not on file  . Number of children: Not on file  . Years of education: Not on file  . Highest education level: Not on file  Occupational History  . Not on file  Tobacco Use  . Smoking status: Former Research scientist (life sciences)  . Smokeless tobacco: Never Used  . Tobacco comment: smoked as teenager  Substance and Sexual Activity  . Alcohol use: Yes    Comment: 2 drinks/month  . Drug use: No  . Sexual activity: Not on file  Other Topics Concern   . Not on file  Social History Narrative  . Not on file   Social Determinants of Health   Financial Resource Strain:   . Difficulty of Paying Living Expenses:   Food Insecurity:   . Worried About Charity fundraiser in the Last Year:   . Arboriculturist in the Last Year:   Transportation Needs:   . Film/video editor (Medical):   Marland Kitchen Lack of Transportation (Non-Medical):   Physical Activity:   . Days of Exercise per Week:   . Minutes of Exercise per Session:   Stress:   . Feeling of Stress :   Social Connections:   . Frequency of Communication with Friends and Family:   . Frequency of Social Gatherings with Friends and Family:   . Attends Religious Services:   . Active Member of Clubs or Organizations:   . Attends Archivist Meetings:   Marland Kitchen Marital Status:   Intimate Partner Violence:   . Fear of Current or Ex-Partner:   . Emotionally Abused:   Marland Kitchen Physically Abused:   . Sexually Abused:     Family History:    Family History  Problem Relation Age of Onset  . Diabetes Father      ROS:  Please see the history of present illness.   All other ROS reviewed and negative.     Physical Exam/Data:   Vitals:   09/30/19 1508 09/30/19 1530 09/30/19 1600 09/30/19 1630  BP:  120/89 126/68 (!) 147/77  Pulse:  (!) 188  (!) 151  Resp:  (!) 23 19 (!) 22  Temp: 98.1 F (36.7 C)     TempSrc: Oral     SpO2:  95% 95% 96%  Weight:      Height:       No intake or output data in the 24 hours ending 09/30/19 1752 Last 3 Weights 09/30/2019 07/04/2016 06/27/2016  Weight (lbs) 270 lb 279 lb 285 lb  Weight (kg) 122.471 kg 126.554 kg 129.275 kg     Body mass index is 34.67 kg/m.  General:  Well nourished, well developed, in no acute distress HEENT: normal Lymph: no adenopathy Neck: no JVD Endocrine:  No thryomegaly Vascular: No carotid bruits; FA pulses 2+ bilaterally without bruits  Cardiac: Irregular irregular, tachycardic no murmur  Lungs:  clear to auscultation  bilaterally, no wheezing, rhonchi or rales  Abd: soft, nontender, no hepatomegaly  Ext: 2+ edema Musculoskeletal:  No deformities, BUE and BLE strength normal and equal Skin: warm and dry  Neuro:  CNs 2-12 intact, no focal abnormalities noted Psych:  Normal affect   EKG:  The EKG was personally reviewed and demonstrates: Atrial fibrillation rapid ventricular response Telemetry:  Telemetry was personally reviewed and demonstrates: Atrial fibrillation, rapid ventricular response  Relevant CV Studies: Echocardiogram ordered  Laboratory Data:  High Sensitivity Troponin:   Recent Labs  Lab 09/30/19 1504  TROPONINIHS 7     Chemistry Recent Labs  Lab 09/30/19 1504  NA 141  K 4.5  CL 109  CO2 26  GLUCOSE 128*  BUN 8  CREATININE 0.93  CALCIUM 9.3  GFRNONAA >60  GFRAA >60  ANIONGAP 6    No results for input(s): PROT, ALBUMIN, AST, ALT, ALKPHOS, BILITOT in the last 168 hours. Hematology Recent Labs  Lab 09/30/19 1504  WBC 5.6  RBC 4.85  HGB 14.3  HCT 43.9  MCV 90.5  MCH 29.5  MCHC 32.6  RDW 12.1  PLT 213   BNP Recent Labs  Lab 09/30/19 1504  BNP 200.0*    DDimer No results for input(s): DDIMER in the last 168 hours.   Radiology/Studies:  DG Chest Portable 1 View  Result Date: 09/30/2019 CLINICAL DATA:  Shortness of breath EXAM: PORTABLE CHEST 1 VIEW COMPARISON:  09/30/2019 FINDINGS: Minimal left base atelectasis. Right lung clear. Heart is normal size. No effusions or acute bony abnormality. IMPRESSION: Left base atelectasis.  No active disease. Electronically Signed   By: Rolm Baptise M.D.   On: 09/30/2019 15:54   {   Assessment and Plan:   1.  New onset atrial fibrillation with rapid ventricular response -CHA2DS2-VASc of 0 -Get echocardiogram -Agree with diltiazem drip for now -Plan to transition to oral diltiazem if echocardiogram shows normal ejection fraction -No indication for anticoagulation at this point since patient's CHA2DS2-VASc score is  0 -If echo is normal and heart rate controlled, may consider TEE guided cardioversion if patient still in A. fib and symptomatic with shortness of breath.  At that point patient will need anticoagulation.  2.  Lower extremity edema -Echocardiogram as above -IV Lasix for diuresing  3.  History of multiple sclerosis -Continue PTA medications    Signed, [name], MD  09/30/2019 5:52 PM

## 2019-09-30 NOTE — ED Triage Notes (Addendum)
Pt here for [facility].  Started a couple weeks ago. Also has BLE swelling worse than normal.  New onset afib RVR in triage noted; pt denies hx of same.  Not on blood thinners. Is having chest pain

## 2019-09-30 NOTE — ED Provider Notes (Signed)
[facility] Emergency Department Provider Note  ____________________________________________   First MD Initiated Contact with Patient 09/30/19 1512     (approximate)  I have reviewed the triage vital signs and the nursing notes.  HISTORY  Chief Complaint Shortness of Breath and Leg Swelling  HPI Dustin Gray is a 64 y.o. male here for evaluation of shortness of breath and weakness  Patient reports for about 2 weeks now his nose has been a little short of breath he has been feeling tired more fatigued than normal.  He went to urgent care to get evaluated for this today and they noticed his heart rate was quite high.  He is no swelling in both legs.  Slight cough for a couple weeks.  No Covid exposure.  His first vaccination couple weeks ago  No chest pain.  Some shortness of breath with walking and sometimes in the evening.  Denies history of heart problems or A. fib   Past Medical History:  Diagnosis Date  . Abscess    groin  . Arthritis    lower spine  . Erectile dysfunction   . Low testosterone   . Lumbar herniated disc    L5  . Multiple sclerosis ([place])   . Staph aureus infection     Patient Active Problem List   Diagnosis Date Noted  . Special screening for malignant neoplasms, colon   . Polyp of sigmoid colon   . Benign neoplasm of ascending colon   . Rectal polyp   . Herniated nucleus pulposus, L5-S1 11/09/2015  . Low back pain 10/25/2015  . Herniated nucleus pulposus, C5-6 right 10/06/2015  . Dysesthesia 09/16/2015  . Spasticity 09/16/2015  . Unsteady gait 09/16/2015  . Multiple sclerosis ([place]) 06/08/2011    Past Surgical History:  Procedure Laterality Date  . COLONOSCOPY WITH PROPOFOL N/A 07/04/2016   Procedure: COLONOSCOPY WITH PROPOFOL;  Surgeon: [name], MD;  Location: [place];  Service: Endoscopy;  Laterality: N/A;  . [place]  . MASS EXCISION  1960  . POLYPECTOMY  07/04/2016   Procedure:  POLYPECTOMY;  Surgeon: [name], MD;  Location: [place];  Service: Endoscopy;;  . TONSILLECTOMY      Prior to Admission medications   Medication Sig Start Date End Date Taking? Authorizing Provider  baclofen (LIORESAL) 10 MG tablet Take 10 mg by mouth 3 (three) times daily.      [provider]  Dimethyl Fumarate (TECFIDERA) 240 MG CPDR Take by mouth 2 (two) times daily.    [provider]  gabapentin (NEURONTIN) 300 MG capsule Take 300 mg by mouth 3 (three) times daily. 300 mg Am and midday.  600 mg PM    [provider]  Multiple Vitamin (MULTIVITAMIN) tablet Take 1 tablet by mouth daily.    [provider]  naproxen sodium (ANAPROX) 220 MG tablet Take 440 mg by mouth 2 (two) times daily with a meal.      [provider]    Allergies Cephalexin, Ancef [cefazolin sodium], Cefadroxil, and Cefazolin  Family History  Problem Relation Age of Onset  . Diabetes Father     Social History Social History   Tobacco Use  . Smoking status: Former Research scientist (life sciences)  . Smokeless tobacco: Never Used  . Tobacco comment: smoked as teenager  Substance Use Topics  . Alcohol use: Yes    Comment: 2 drinks/month  . Drug use: No    Review of Systems Constitutional: No fever/chills Eyes:  No visual changes. ENT: No sore throat. Cardiovascular: Denies chest pain. Respiratory: See HPI Gastrointestinal: No abdominal pain.   Genitourinary: Negative for dysuria. Musculoskeletal: Negative for back pain.  Swelling both legs. Skin: Negative for rash. Neurological: Negative for headaches, areas of focal weakness or numbness.    ____________________________________________   PHYSICAL EXAM:  VITAL SIGNS: ED Triage Vitals  Enc Vitals Group     BP 09/30/19 1457 131/79     Pulse Rate 09/30/19 1456 (!) 190     Resp 09/30/19 1457 (!) 26     Temp 09/30/19 1508 98.1 F (36.7 C)     Temp Source 09/30/19 1508 Oral     SpO2 09/30/19 1457 95 %      Weight 09/30/19 1452 270 lb (122.5 kg)     Height 09/30/19 1452 6\' 2"  (1.88 m)     Head Circumference --      Peak Flow --      Pain Score 09/30/19 1452 3     Pain Loc --      Pain Edu? --      Excl. in [place]? --     Constitutional: Alert and oriented. Well appearing and in no acute distress. Eyes: Conjunctivae are normal. Head: Atraumatic. Nose: No congestion/rhinnorhea. Mouth/Throat: Mucous membranes are moist. Neck: No stridor.  Cardiovascular: Tachycardic and irregular.  Grossly normal heart sounds.  Good peripheral circulation. Respiratory: Normal respiratory effort.  No retractions. Lungs CTAB. Gastrointestinal: Soft and nontender. No distention. Musculoskeletal: No lower extremity tenderness 2+ pitting edema lower extremities bilateral. Neurologic:  Normal speech and language. No gross focal neurologic deficits are appreciated.  Skin:  Skin is warm, dry and intact. No rash noted. Psychiatric: Mood and affect are normal. Speech and behavior are normal.  ____________________________________________   LABS (all labs ordered are listed, but only abnormal results are displayed)  Labs Reviewed  BASIC METABOLIC PANEL - Abnormal; Notable for the following components:      Result Value   Glucose, Bld 128 (*)    All other components within normal limits  BRAIN NATRIURETIC PEPTIDE - Abnormal; Notable for the following components:   B Natriuretic Peptide 200.0 (*)    All other components within normal limits  SARS CORONAVIRUS 2 (TAT 6-24 HRS)  CBC  TROPONIN I (HIGH SENSITIVITY)   ____________________________________________  EKG  Reviewed inter by me at 1500 Heart rate 180 QRS 60 QTc 400 Atrial fibrillation with rapid ventricular response ____________________________________________  RADIOLOGY  DG Chest Portable 1 View  Result Date: 09/30/2019 CLINICAL DATA:  Shortness of breath EXAM: PORTABLE CHEST 1 VIEW COMPARISON:  09/30/2019 FINDINGS: Minimal left base atelectasis.  Right lung clear. Heart is normal size. No effusions or acute bony abnormality. IMPRESSION: Left base atelectasis.  No active disease. Electronically Signed   By: Rolm Baptise M.D.   On: 09/30/2019 15:54    Imaging reviewed, no active disease.  Atelectasis left base ____________________________________________   PROCEDURES  Procedure(s) performed: 3-lead  .1-3 Lead EKG Interpretation Performed by: Delman Kitten, MD Authorized by: Delman Kitten, MD     Interpretation: abnormal     ECG rate:  140   ECG rate assessment: tachycardic     Rhythm: atrial fibrillation     Ectopy: none     Conduction: abnormal      Critical Care performed: Yes, see critical care note(s)  CRITICAL CARE Performed by: Delman Kitten   Total critical care time: 25 minutes  Critical care time was exclusive of separately billable procedures and  treating other patients.  Critical care was necessary to treat or prevent imminent or life-threatening deterioration.  Critical care was time spent personally by me on the following activities: development of treatment plan with patient and/or surrogate as well as nursing, discussions with consultants, evaluation of patient's response to treatment, examination of patient, obtaining history from patient or surrogate, ordering and performing treatments and interventions, ordering and review of laboratory studies, ordering and review of radiographic studies, pulse oximetry and re-evaluation of patient's condition.  Patient presents with extreme elevation of heart rate, new onset A. fib.  Required IV antiarrhythmic.  Also shows evidence of possible volume overload or failure.  ____________________________________________   INITIAL IMPRESSION / ASSESSMENT AND PLAN / ED COURSE  Pertinent labs & imaging results that were available during my care of the patient were reviewed by me and considered in my medical decision making (see chart for details).   Patient presents for new  onset A. fib.  Reports lower extremity edema bilateral as well.  No chest pain.  Recently in normal health.  No acute distress except for he is feeling fatigued noticing some shortness of breath over the last 2 weeks  No clear inciting factor.  Does not appear to have any acute risk factors for DVT or PE.  No known history of coronary disease.    ----------------------------------------- 4:01 PM on 09/30/2019 ----------------------------------------- Consult affirmed by Dr. Mylo Red  Patient's heart rate has improved about 130.  Blood pressure has improved to about [id number] systolic.  Will give oral diltiazem at this time.  Continue to await further work-up occluding lab testing.  Patient in no acute distress.     Clinical Course as of Sep 30 1643  Wed Sep 30, 2019  1641 Patient resting comfortably, his heart rate currently variable from about 120s to 140s still A. fib RVR.  Receiving oral diltiazem dose now.  He reports he is quite comfortably does not appear in any distress.  Heart rate is improving from presentation, continue to follow closely.  Admitting for further work-up care and medication management, cardiology consulting   [MQ]    Clinical Course User Index [MQ] Delman Kitten, MD   ----------------------------------------- 4:45 PM on 09/30/2019 -----------------------------------------  Patient resting, understanding agreeable with plan.  Have paged cardiology to obtain additional treatment recommendations, consider if they would like to trial diuretics.  I think an echo would also be very helpful in further management as we move forward as well as discussions regarding anticoagulation etc., hospitalist will see and evaluate ____________________________________________   FINAL CLINICAL IMPRESSION(S) / ED DIAGNOSES  Final diagnoses:  Atrial fibrillation with rapid ventricular response ([place])        Note:  This document was prepared using Dragon voice recognition software and  may include unintentional dictation errors       Delman Kitten, MD 09/30/19 1645

## 2019-09-30 NOTE — H&P (Addendum)
History and Physical    Dustin Gray [id number] DOB: [birth year] DOA: 09/30/2019  PCP: [name], MD   Patient coming from: Home  I have personally briefly reviewed patient's old medical records in [place]  Chief Complaint: Shortness of breath and lower extremity edema.  HPI: Dustin Gray is a 64 y.o. male with medical history significant of multiple sclerosis, and back pain sent to ED from urgent care where he presented with complaints of 2 weeks of worsening dyspnea, generalized malaise and lower extremity edema.  Found to have new onset A. fib with RVR.  No chest pain.  No orthopnea or PND.  Patient denies any prior cardiac history.  No recent upper respiratory symptoms.  Patient with some seasonal allergies and postnasal drip due to that which is chronic.  No cough or congestion.  No fever or chills.  No nausea or vomiting.  No recent change in his weight or bowel habits.  Patient with increased urinary frequency, stating that he is trying to keep himself more hydrated due to some constipation.  Constipation resolved with increase intake of water.  Denies any dysuria or hematuria.  No recent exacerbation of his MS symptoms.  Follow-up with neurology. Denies any sick contacts or Covid exposure.  Received his first dose of vaccine couple of weeks ago.  Worsening lower extremity edema, left worse than right.  Noticed a small cut on the back of lower leg.  Could not remember any injury.  Left with more edema and erythema.  Denies any pain but complaining of calf tightness.  No recent travel.  ED Course: Patient was hemodynamically stable.  EKG with new onset A. fib with RVR and heart rate in 190s.  He was given diltiazem bolus with good response.  Labs were unremarkable.  COVID-19 test pending.  X-ray with left base atelectasis.  Admitted for new onset A. fib with RVR.  Review of Systems: As per HPI otherwise 10 point review of systems negative.   Past Medical History:    Diagnosis Date  . Abscess    groin  . Arthritis    lower spine  . Erectile dysfunction   . Low testosterone   . Lumbar herniated disc    L5  . Multiple sclerosis ([place])   . Staph aureus infection     Past Surgical History:  Procedure Laterality Date  . COLONOSCOPY WITH PROPOFOL N/A 07/04/2016   Procedure: COLONOSCOPY WITH PROPOFOL;  Surgeon: [name], MD;  Location: [facility];  Service: Endoscopy;  Laterality: N/A;  . [place]  . MASS EXCISION  1960  . POLYPECTOMY  07/04/2016   Procedure: POLYPECTOMY;  Surgeon: [name], MD;  Location: [facility];  Service: Endoscopy;;  . TONSILLECTOMY       reports that he has quit smoking. He has never used smokeless tobacco. He reports current alcohol use. He reports that he does not use drugs.  Allergies  Allergen Reactions  . Cephalexin Hives  . Ancef [Cefazolin Sodium] Rash  . Cefadroxil Rash  . Cefazolin Rash    Family History  Problem Relation Age of Onset  . Diabetes Father     Prior to Admission medications   Medication Sig Start Date End Date Taking? Authorizing Provider  baclofen (LIORESAL) 10 MG tablet Take 10 mg by mouth 3 (three) times daily.      [provider]  Dimethyl Fumarate (TECFIDERA) 240 MG CPDR Take by mouth 2 (two) times daily.  [provider]  gabapentin (NEURONTIN) 300 MG capsule Take 300 mg by mouth 3 (three) times daily. 300 mg Am and midday.  600 mg PM    [provider]  Multiple Vitamin (MULTIVITAMIN) tablet Take 1 tablet by mouth daily.    [provider]  naproxen sodium (ANAPROX) 220 MG tablet Take 440 mg by mouth 2 (two) times daily with a meal.      [provider]    Physical Exam: Vitals:   09/30/19 1508 09/30/19 1530 09/30/19 1600 09/30/19 1630  BP:  120/89 126/68 (!) 147/77  Pulse:  (!) 188  (!) 151  Resp:  (!) 23 19 (!) 22  Temp: 98.1 F (36.7 C)     TempSrc: Oral     SpO2:  95% 95% 96%  Weight:       Height:        General: Vital signs reviewed.  Patient is well-developed and well-nourished, in no acute distress and cooperative with exam.  Head: Normocephalic and atraumatic. Eyes: EOMI, conjunctivae normal, no scleral icterus.  ENMT: Mucous membranes are moist. Neck: Supple, trachea midline, normal ROM, no JVD, masses, thyromegaly, or carotid bruit present.  Cardiovascular: Irregularly irregular with tachycardia, no murmurs, gallops, or rubs. Pulmonary/Chest: Few basal crackles. Abdominal: Soft, non-tender, non-distended, BS +, no masses, organomegaly, or guarding present.  Musculoskeletal: No joint deformities, erythema, or stiffness, ROM full and nontender. Extremities: 2+ lower extremity edema left worse than right with mild hyperthermia and erythema with some blistering and a small laceration on distal posterior leg with some serous drainage,  pulses symmetric and intact bilaterally. No cyanosis or clubbing. Neurological: A&O x3, Strength is normal and symmetric bilaterally, cranial nerve II-XII are grossly intact, no focal motor deficit, sensory intact to light touch bilaterally.  Psychiatric: Normal mood and affect. speech and behavior is normal. Cognition and memory are normal.   Labs on Admission: I have personally reviewed following labs and imaging studies  CBC: Recent Labs  Lab 09/30/19 1504  WBC 5.6  HGB 14.3  HCT 43.9  MCV 90.5  PLT [id number]   Basic Metabolic Panel: Recent Labs  Lab 09/30/19 1504  NA 141  K 4.5  CL 109  CO2 26  GLUCOSE 128*  BUN 8  CREATININE 0.93  CALCIUM 9.3   GFR: Estimated Creatinine Clearance: 113 mL/min (by C-G formula based on SCr of 0.93 mg/dL). Liver Function Tests: No results for input(s): AST, ALT, ALKPHOS, BILITOT, PROT, ALBUMIN in the last 168 hours. No results for input(s): LIPASE, AMYLASE in the last 168 hours. No results for input(s): AMMONIA in the last 168 hours. Coagulation Profile: No results for input(s): INR, PROTIME  in the last 168 hours. Cardiac Enzymes: No results for input(s): CKTOTAL, CKMB, CKMBINDEX, TROPONINI in the last 168 hours. BNP (last 3 results) No results for input(s): PROBNP in the last 8760 hours. HbA1C: No results for input(s): HGBA1C in the last 72 hours. CBG: No results for input(s): GLUCAP in the last 168 hours. Lipid Profile: No results for input(s): CHOL, HDL, LDLCALC, TRIG, CHOLHDL, LDLDIRECT in the last 72 hours. Thyroid Function Tests: No results for input(s): TSH, T4TOTAL, FREET4, T3FREE, THYROIDAB in the last 72 hours. Anemia Panel: No results for input(s): VITAMINB12, FOLATE, FERRITIN, TIBC, IRON, RETICCTPCT in the last 72 hours. Urine analysis: No results found for: COLORURINE, APPEARANCEUR, [place], [place], GLUCOSEU, HGBUR, BILIRUBINUR, KETONESUR, PROTEINUR, UROBILINOGEN, NITRITE, LEUKOCYTESUR  Radiological Exams on Admission: DG Chest Portable 1 View  Result Date: 09/30/2019 CLINICAL DATA:  Shortness of breath EXAM: PORTABLE CHEST 1 VIEW COMPARISON:  09/30/2019 FINDINGS: Minimal left base atelectasis. Right lung clear. Heart is normal size. No effusions or acute bony abnormality. IMPRESSION: Left base atelectasis.  No active disease. Electronically Signed   By: Rolm Baptise M.D.   On: 09/30/2019 15:54    EKG: Independently reviewed.  A. fib with RVR  Assessment/Plan Active Problems:   Atrial fibrillation with RVR (HCC)   New onset A. fib with RVR.  No significant inciting event.  No prior cardiac history.  No chest pain.  Troponin remain negative. -Start him on Cardizem infusion as heart rate was mostly in 180s when seen. -Cardiology was consulted from ED-appreciate their recommendations. -Check TSH  Dyspnea with lower extremity edema.  Appears volume up.  No prior history of cardiomyopathy.  Chest x-ray without any pulmonary edema.  Saturating well on room air.  BNP elevated at 200.  New onset heart failure?? -Cardiology is on board. -Obtain  echocardiogram. -Start him on Lasix 40 mg daily/ -Monitor intake and output. -Daily weight.  Left lower extremity cellulitis.  Hemodynamically stable, afebrile and no leukocytosis.  Calf appears very tight.  Left lower extremity with more edema associated with some erythema and hyperthermia and a small laceration. -Start him on clindamycin p.o. per mild cellulitis protocol.  Patient has cephalosporin allergies. -Obtain lower extremity venous Doppler to rule out DVT.  History of MS. Stable, no acute exacerbation.  Follow-up with neurology. -Continue home dose of Tecfidera, baclofen and Neurontin.   DVT prophylaxis: Lovenox Code Status: Full code confirmed with patient Family Communication: No family at bedside.  Discussed plan with patient. Disposition Plan: Pending improvement and work-up. Consults called: Cardiology Admission status: Observation   Lorella Nimrod MD [facility]  If 7PM-7AM, please contact night-coverage [email or web address]  09/30/2019, 5:47 PM   This record has been created using Systems analyst. Errors have been sought and corrected,but may not always be located. Such creation errors do not reflect on the standard of care.

## 2019-10-01 ENCOUNTER — Inpatient Hospital Stay: Payer: Medicare PPO

## 2019-10-01 ENCOUNTER — Observation Stay (HOSPITAL_COMMUNITY)
Admit: 2019-10-01 | Discharge: 2019-10-01 | Disposition: A | Discharge status: Home / Self Care | Payer: Medicare PPO | Attending: Cardiovascular Disease | Admitting: Cardiovascular Disease

## 2019-10-01 DIAGNOSIS — R0602 Shortness of breath: Secondary | ICD-10-CM

## 2019-10-01 DIAGNOSIS — Z87891 Personal history of nicotine dependence: Secondary | ICD-10-CM | POA: Diagnosis not present

## 2019-10-01 DIAGNOSIS — M5127 Other intervertebral disc displacement, lumbosacral region: Secondary | ICD-10-CM | POA: Diagnosis present

## 2019-10-01 DIAGNOSIS — R600 Localized edema: Secondary | ICD-10-CM | POA: Diagnosis not present

## 2019-10-01 DIAGNOSIS — Z881 Allergy status to other antibiotic agents status: Secondary | ICD-10-CM | POA: Diagnosis not present

## 2019-10-01 DIAGNOSIS — I4891 Unspecified atrial fibrillation: Secondary | ICD-10-CM | POA: Diagnosis not present

## 2019-10-01 DIAGNOSIS — Z79899 Other long term (current) drug therapy: Secondary | ICD-10-CM | POA: Diagnosis not present

## 2019-10-01 DIAGNOSIS — I5031 Acute diastolic (congestive) heart failure: Secondary | ICD-10-CM | POA: Diagnosis present

## 2019-10-01 DIAGNOSIS — E0500 Thyrotoxicosis with diffuse goiter without thyrotoxic crisis or storm: Secondary | ICD-10-CM | POA: Diagnosis present

## 2019-10-01 DIAGNOSIS — J811 Chronic pulmonary edema: Secondary | ICD-10-CM | POA: Diagnosis not present

## 2019-10-01 DIAGNOSIS — G35 Multiple sclerosis: Secondary | ICD-10-CM | POA: Diagnosis present

## 2019-10-01 DIAGNOSIS — I272 Pulmonary hypertension, unspecified: Secondary | ICD-10-CM | POA: Diagnosis present

## 2019-10-01 DIAGNOSIS — L03116 Cellulitis of left lower limb: Secondary | ICD-10-CM | POA: Diagnosis present

## 2019-10-01 DIAGNOSIS — E0590 Thyrotoxicosis, unspecified without thyrotoxic crisis or storm: Secondary | ICD-10-CM | POA: Insufficient documentation

## 2019-10-01 DIAGNOSIS — I4819 Other persistent atrial fibrillation: Secondary | ICD-10-CM | POA: Diagnosis present

## 2019-10-01 DIAGNOSIS — Z20822 Contact with and (suspected) exposure to covid-19: Secondary | ICD-10-CM | POA: Diagnosis present

## 2019-10-01 DIAGNOSIS — R140 Abdominal distension (gaseous): Secondary | ICD-10-CM | POA: Diagnosis not present

## 2019-10-01 DIAGNOSIS — Z833 Family history of diabetes mellitus: Secondary | ICD-10-CM | POA: Diagnosis not present

## 2019-10-01 LAB — HEPATIC FUNCTION PANEL
ALT: 31 U/L (ref 0–44)
AST: 24 U/L (ref 15–41)
Albumin: 3.7 g/dL (ref 3.5–5.0)
Alkaline Phosphatase: 51 U/L (ref 38–126)
Bilirubin, Direct: 0.2 mg/dL (ref 0.0–0.2)
Indirect Bilirubin: 1 mg/dL — ABNORMAL HIGH (ref 0.3–0.9)
Total Bilirubin: 1.2 mg/dL (ref 0.3–1.2)
Total Protein: 6 g/dL — ABNORMAL LOW (ref 6.5–8.1)

## 2019-10-01 LAB — BASIC METABOLIC PANEL
Anion gap: 9 (ref 5–15)
BUN: 9 mg/dL (ref 8–23)
CO2: 27 mmol/L (ref 22–32)
Calcium: 9 mg/dL (ref 8.9–10.3)
Chloride: 106 mmol/L (ref 98–111)
Creatinine, Ser: 0.84 mg/dL (ref 0.61–1.24)
GFR calc Af Amer: >60 mL/min (ref >60)
GFR calc non Af Amer: >60 mL/min (ref >60)
Glucose, Bld: 126 mg/dL — ABNORMAL HIGH (ref 70–99)
Potassium: 3.7 mmol/L (ref 3.5–5.1)
Sodium: 142 mmol/L (ref 135–145)

## 2019-10-01 LAB — MAGNESIUM: Magnesium: 2 mg/dL (ref 1.7–2.4)

## 2019-10-01 LAB — LIPID PANEL
Cholesterol: 108 mg/dL (ref 0–200)
HDL: 35 mg/dL — ABNORMAL LOW (ref >40)
LDL Cholesterol: 57 mg/dL (ref 0–99)
Total CHOL/HDL Ratio: 3.1 RATIO
Triglycerides: 81 mg/dL (ref <150)
VLDL: 16 mg/dL (ref 0–40)

## 2019-10-01 LAB — CBC
HCT: 42 % (ref 39.0–52.0)
Hemoglobin: 13.9 g/dL (ref 13.0–17.0)
MCH: 29.6 pg (ref 26.0–34.0)
MCHC: 33.1 g/dL (ref 30.0–36.0)
MCV: 89.6 fL (ref 80.0–100.0)
Platelets: 212 10*3/uL (ref 150–400)
RBC: 4.69 MIL/uL (ref 4.22–5.81)
RDW: 12.4 % (ref 11.5–15.5)
WBC: 5.6 10*3/uL (ref 4.0–10.5)
nRBC: 0 % (ref 0.0–0.2)

## 2019-10-01 LAB — T4, FREE: Free T4: 3.29 ng/dL — ABNORMAL HIGH (ref 0.61–1.12)

## 2019-10-01 LAB — ECHOCARDIOGRAM COMPLETE
Height: 74 in
Weight: 4320 oz

## 2019-10-01 LAB — SARS CORONAVIRUS 2 (TAT 6-24 HRS): SARS Coronavirus 2: NEGATIVE

## 2019-10-01 MED ORDER — POTASSIUM CHLORIDE CRYS ER 20 MEQ PO TBCR
40.0000 meq | EXTENDED_RELEASE_TABLET | Freq: Two times a day (BID) | ORAL | Status: DC
  Administered 2019-10-01 – 2019-10-03 (×5): 40 meq via ORAL
  Filled 2019-10-01 (×5): qty 2

## 2019-10-01 MED ORDER — DILTIAZEM HCL 25 MG/5ML IV SOLN: INTRAVENOUS | Status: DC

## 2019-10-01 MED ORDER — DILTIAZEM HCL 25 MG/5ML IV SOLN: 20.0000 mg | Freq: Once | INTRAVENOUS | Status: DC

## 2019-10-01 MED ORDER — PROPRANOLOL HCL 20 MG PO TABS
20.0000 mg | ORAL_TABLET | Freq: Four times a day (QID) | ORAL | Status: DC
  Administered 2019-10-01 – 2019-10-02 (×4): 20 mg via ORAL
  Filled 2019-10-01 (×4): qty 1

## 2019-10-01 MED ORDER — DILTIAZEM HCL-DEXTROSE 125-5 MG/125ML-% IV SOLN (PREMIX): 5.0000 mg/h | INTRAVENOUS | Status: DC

## 2019-10-01 MED ORDER — METHIMAZOLE 10 MG PO TABS
20.0000 mg | ORAL_TABLET | Freq: Every day | ORAL | Status: DC
  Administered 2019-10-01 – 2019-10-02 (×2): 20 mg via ORAL
  Filled 2019-10-01 (×2): qty 2

## 2019-10-01 MED ORDER — DILTIAZEM HCL-DEXTROSE 125-5 MG/125ML-% IV SOLN (PREMIX)
5.0000 mg/h | INTRAVENOUS | Status: DC
  Administered 2019-10-01 – 2019-10-02 (×2): 20 mg/h via INTRAVENOUS
  Filled 2019-10-01 (×3): qty 125

## 2019-10-01 MED ORDER — DILTIAZEM HCL 25 MG/5ML IV SOLN
INTRAVENOUS | Status: DC
  Filled 2019-10-01 (×5): qty 125

## 2019-10-01 MED ORDER — FUROSEMIDE 10 MG/ML IJ SOLN
40.0000 mg | Freq: Two times a day (BID) | INTRAMUSCULAR | Status: DC
  Administered 2019-10-01: 40 mg via INTRAVENOUS
  Filled 2019-10-01: qty 4

## 2019-10-01 MED ORDER — DILTIAZEM HCL 25 MG/5ML IV SOLN
20.0000 mg | Freq: Once | INTRAVENOUS | Status: AC
  Administered 2019-10-01: 20 mg via INTRAVENOUS
  Filled 2019-10-01: qty 5

## 2019-10-01 MED ORDER — FUROSEMIDE 10 MG/ML IJ SOLN
40.0000 mg | Freq: Three times a day (TID) | INTRAMUSCULAR | Status: DC
  Administered 2019-10-01 – 2019-10-03 (×6): 40 mg via INTRAVENOUS
  Filled 2019-10-01 (×6): qty 4

## 2019-10-01 NOTE — Progress Notes (Signed)
*  PRELIMINARY RESULTS* Echocardiogram 2D Echocardiogram has been performed.  Dustin Gray 10/01/2019, 12:15 PM

## 2019-10-01 NOTE — Progress Notes (Signed)
Progress Note  Patient Name: Dustin Gray Date of Encounter: 10/01/2019  Primary Cardiologist: new to [facility] - consult by [place]  Subjective   He remains in Afib with RVR with ventricular rates in the 120s to 140s bpm. Waiting for new diltiazem bag from pharmacy. Longstanding history of lower extremity swelling and erythema that has been worse for the past couple of weeks. He also notes increase in abdominal distension. Documented UOP of 1.3 L for the admission to date on IV Lasix 40 mg bid. HS-Tn negative.   Inpatient Medications    Scheduled Meds: . baclofen  10 mg Oral TID  . clindamycin  300 mg Oral Q6H  . enoxaparin (LOVENOX) injection  40 mg Subcutaneous Q24H  . furosemide  40 mg Intravenous BID  . gabapentin  300 mg Oral q morning - 10a   And  . gabapentin  600 mg Oral QHS  . sodium chloride flush  3 mL Intravenous Q12H   Continuous Infusions: . diltiazem (CARDIZEM) infusion 17.5 mg/hr (10/01/19 1020)   PRN Meds: acetaminophen **OR** acetaminophen, ondansetron **OR** ondansetron (ZOFRAN) IV, polyethylene glycol   Vital Signs    Vitals:   10/01/19 0830 10/01/19 0900 10/01/19 0930 10/01/19 0945  BP: (!) 142/65 123/74 (!) 147/64   Pulse: (!) 134 (!) 141  (!) 121  Resp: 18   20  Temp:      TempSrc:      SpO2:      Weight:      Height:        Intake/Output Summary (Last 24 hours) at 10/01/2019 1038 Last data filed at 10/01/2019 1020 Gross per 24 hour  Intake 209.16 ml  Output 1100 ml  Net -890.84 ml   Filed Weights   09/30/19 1452  Weight: 122.5 kg    Telemetry    Afib with RVR with ventricular rates in the 120s to 140s bpm - Personally Reviewed  ECG    No new tracings - Personally Reviewed  Physical Exam   GEN: No acute distress.   Neck: No JVD. Cardiac: Tachycardic and irregularly irregular, no murmurs, rubs, or gallops.  Respiratory: Clear to auscultation bilaterally.  GI: Soft, nontender, non-distended.   MS: 3+ bilateral lower  extremity edema with associated erythema; No deformity. Neuro:  Alert and oriented x 3; Nonfocal.  Psych: Normal affect.  Labs    Chemistry Recent Labs  Lab 09/30/19 1504 09/30/19 1822 10/01/19 0358  NA 141  --  142  K 4.5  --  3.7  CL 109  --  106  CO2 26  --  27  GLUCOSE 128*  --  126*  BUN 8  --  9  CREATININE 0.93 0.84 0.84  CALCIUM 9.3  --  9.0  GFRNONAA >60 >60 >60  GFRAA >60 >60 >60  ANIONGAP 6  --  9     Hematology Recent Labs  Lab 09/30/19 1504 09/30/19 1822 10/01/19 0358  WBC 5.6 5.3 5.6  RBC 4.85 4.95 4.69  HGB 14.3 14.5 13.9  HCT 43.9 44.5 42.0  MCV 90.5 89.9 89.6  MCH 29.5 29.3 29.6  MCHC 32.6 32.6 33.1  RDW 12.1 12.2 12.4  PLT 213 194 212    Cardiac EnzymesNo results for input(s): TROPONINI in the last 168 hours. No results for input(s): TROPIPOC in the last 168 hours.   BNP Recent Labs  Lab 09/30/19 1504  BNP 200.0*     DDimer No results for input(s): DDIMER in the last 168  hours.   Radiology    US Venous Img Lower Bilateral (DVT)  Result Date: 10/01/2019 IMPRESSION: No evidence of deep venous thrombosis in either lower extremity. Electronically Signed   By: Constance Holster M.D.   On: 10/01/2019 00:30   DG Chest Portable 1 View  Result Date: 09/30/2019 IMPRESSION: Left base atelectasis.  No active disease. Electronically Signed   By: Rolm Baptise M.D.   On: 09/30/2019 15:54    Cardiac Studies   2D echo pending  Patient Profile     64 y.o. male with history of multiple sclerosis, lymphedema, and spinal stenosis who we are seeing for Afib with RVR in the setting of newly diagnosed hyperthyroidism.  Assessment & Plan    1. Afib with RVR: -His ventricular rates remain tachycardic in the 120s to 140s bpm -Continue diltiazem gtt (currently waiting for new bag) -This is overall a difficult situation as his ventricular rates will be difficult to control until his hyperthyroidism is treated -TSH < 0.010 with free T4 3.29 -Will need  adequate heart rate control prior to discharge  -CHADS2VASc 0 -Will likely need to start anticoagulation in preparation for rhythm control down the road following adequate treatment of his hyperthyroidism  -No plans for DCCV at this time, as he will not hold sinus rhythm until his thyroid is treated  -Add propranolol 20 mg q hours -Echo pending  2. Hyperthyroidism: -Driving his Afib with RVR, as this is treated his rates will improve  -Not currently on therapy as thyroid scan is pending -Endocrinology has been consulted per IM -Management per primary   3. Lower extremity swelling: -Longstanding issue, has been evaluated by vascular surgery at [facility] with recommendation for compression stockings -He is volume up and likely in the setting of his Afib with RVR -Worse over the past couple of weeks -Likely exacerbated by the above -Echo pending -Titrate IV Lasix to 40 mg q 8 hours with KCl repletion   For questions or updates, please contact [place] Please consult [email or web address] for contact info under Cardiology/STEMI.    Signed, Christell Faith, PA-C [place] Pager: [phone] 10/01/2019, 10:38 AM

## 2019-10-01 NOTE — ED Notes (Signed)
Ultrasound at bedside at this time.

## 2019-10-01 NOTE — Progress Notes (Signed)
PROGRESS NOTE    Dustin Gray  [id number] DOB: 31-Mar-1956 DOA: 09/30/2019 PCP: [name], MD   Brief Narrative:  Dustin Gray is a 64 y.o. male with medical history significant of multiple sclerosis, and back pain sent to ED from urgent care where he presented with complaints of 2 weeks of worsening dyspnea, generalized malaise and lower extremity edema.  Found to have new onset A. fib with RVR.  No chest pain.  No orthopnea or PND.  Patient denies any prior cardiac history.  No recent upper respiratory symptoms.  COVID-19 negative.  Given diltiazem bolus and started on infusion.  Found to have undetectable TSH.  Subjective: No new complaints today.  Continue to feel some palpitations and heart rate remained up.  We discussed about hyperthyroidism and further work-up and management.  Assessment & Plan:   Active Problems:   Atrial fibrillation with rapid ventricular response (HCC)   Atrial fibrillation with RVR (HCC)  New onset A. fib with RVR.   Most likely secondary to hyperthyroidism.  Heart rate is difficult to control, he was requiring boluses of diltiazem overnight. CHA2DS2-VASc score of 0.  No need for anticoagulation at this time. Cardiology is on board-appreciate their recommendations. -Continue with diltiazem drip at a higher infusion rate. -Add propranolol. -Echocardiogram done-pending results. -Continue to monitor.  Hyperthyroidism.  Has a new diagnosis of hyperthyroidism with TSH below 0.010 and free T4 of 3.29.  Most likely the cause of his A. fib with RVR and cardiac symptoms. -Check total and free T3, -Check thyroid antibodies. -Thyroid ultrasound. -Talked with Dr. Gabriel Carina at [facility]-she advised not to wait for radioactive iodine uptake scan and start him on methimazole 20 mg daily and he will follow up with her as an outpatient within few days after discharge.  Dyspnea with lower extremity edema.  Appears volume up.  No prior history of  cardiomyopathy.  Chest x-ray without any pulmonary edema.  Saturating well on room air.  BNP elevated at 200.  New onset heart failure?? -Cardiology is on board. -Obtain echocardiogram-ending results. -Increase Lasix to 40 mg every 8 hourly. -Monitor intake and output. -Daily weight.  Left lower extremity cellulitis.  Hemodynamically stable, afebrile and no leukocytosis.  Calf appears very tight.  Left lower extremity with more edema associated with some erythema and hyperthermia and a small laceration. -Continue clindamycin p.o. per mild cellulitis protocol.  Patient has cephalosporin allergies. - lower extremity venous Doppler was negative for DVT.  History of MS. Stable, no acute exacerbation.  Follow-up with neurology. -Continue home dose of Tecfidera, baclofen and Neurontin.  Objective: Vitals:   10/01/19 1228 10/01/19 1300 10/01/19 1331 10/01/19 1349  BP: 129/71 123/77 126/76 126/76  Pulse: (!) 115 (!) 120  (!) 123  Resp: 18 18 19    Temp:      TempSrc:      SpO2: 92%     Weight:      Height:        Intake/Output Summary (Last 24 hours) at 10/01/2019 1430 Last data filed at 10/01/2019 1030 Gross per 24 hour  Intake 569.16 ml  Output 1900 ml  Net -1330.84 ml   Filed Weights   09/30/19 1452  Weight: 122.5 kg    Examination:  General exam: Appears calm and comfortable.  Mild diffuse thyroid enlargement. Respiratory system: Clear to auscultation. Respiratory effort normal. Cardiovascular system: Irregularly irregular with tachycardia,. No JVD, murmurs, rubs, gallops or clicks. Gastrointestinal system: Soft, nontender, nondistended, bowel sounds positive. Central nervous  system: Alert and oriented. No focal neurological deficits.Symmetric 5 x 5 power. Extremities: 2+ LE edema, left worse than right with some erythema and hyperthermia, no cyanosis, pulses intact and symmetrical. Skin: No rashes, lesions or ulcers Psychiatry: Judgement and insight appear normal. Mood &  affect appropriate.    DVT prophylaxis: Lovenox Code Status: Full Family Communication: Updated the patient. Disposition Plan: Pending improvement and heart rate control.  Patient with new diagnosis of hyperthyroidism, starting methimazole today, pending rest of the labs.  Consultants:   Cardiology  Curbside endocrinology  Procedures:  Antimicrobials:   Data Reviewed: I have personally reviewed following labs and imaging studies  CBC: Recent Labs  Lab 09/30/19 1504 09/30/19 1822 10/01/19 0358  WBC 5.6 5.3 5.6  HGB 14.3 14.5 13.9  HCT 43.9 44.5 42.0  MCV 90.5 89.9 89.6  PLT 213 194 [id number]   Basic Metabolic Panel: Recent Labs  Lab 09/30/19 1504 09/30/19 1822 10/01/19 0358  NA 141  --  142  K 4.5  --  3.7  CL 109  --  106  CO2 26  --  27  GLUCOSE 128*  --  126*  BUN 8  --  9  CREATININE 0.93 0.84 0.84  CALCIUM 9.3  --  9.0  MG  --   --  2.0   GFR: Estimated Creatinine Clearance: 125.2 mL/min (by C-G formula based on SCr of 0.84 mg/dL). Liver Function Tests: No results for input(s): AST, ALT, ALKPHOS, BILITOT, PROT, ALBUMIN in the last 168 hours. No results for input(s): LIPASE, AMYLASE in the last 168 hours. No results for input(s): AMMONIA in the last 168 hours. Coagulation Profile: No results for input(s): INR, PROTIME in the last 168 hours. Cardiac Enzymes: No results for input(s): CKTOTAL, CKMB, CKMBINDEX, TROPONINI in the last 168 hours. BNP (last 3 results) No results for input(s): PROBNP in the last 8760 hours. HbA1C: No results for input(s): HGBA1C in the last 72 hours. CBG: No results for input(s): GLUCAP in the last 168 hours. Lipid Profile: Recent Labs    10/01/19 0358  CHOL 108  HDL 35*  LDLCALC 57  TRIG 81  CHOLHDL 3.1   Thyroid Function Tests: Recent Labs    09/30/19 1822 10/01/19 0927  TSH <0.010*  --   FREET4  --  3.29*   Anemia Panel: No results for input(s): VITAMINB12, FOLATE, FERRITIN, TIBC, IRON, RETICCTPCT in the last  72 hours. Sepsis Labs: No results for input(s): PROCALCITON, LATICACIDVEN in the last 168 hours.  Recent Results (from the past 240 hour(s))  SARS CORONAVIRUS 2 (TAT 6-24 HRS) Nasopharyngeal Nasopharyngeal Swab     Status: None   Collection Time: 09/30/19  4:46 PM   Specimen: Nasopharyngeal Swab  Result Value Ref Range Status   SARS Coronavirus 2 NEGATIVE NEGATIVE Final    Comment: (NOTE) SARS-CoV-2 target nucleic acids are NOT DETECTED. The SARS-CoV-2 RNA is generally detectable in upper and lower respiratory specimens during the acute phase of infection. Negative results do not preclude SARS-CoV-2 infection, do not rule out co-infections with other pathogens, and should not be used as the sole basis for treatment or other patient management decisions. Negative results must be combined with clinical observations, patient history, and epidemiological information. The expected result is Negative. Fact Sheet for Patients: SugarRoll.be Fact Sheet for Healthcare Providers: https://www.woods-mathews.com/ This test is not yet approved or cleared by the [place] FDA and  has been authorized for detection and/or diagnosis of SARS-CoV-2 by FDA under an Emergency Use Authorization (  EUA). This EUA will remain  in effect (meaning this test can be used) for the duration of the COVID-19 declaration under Section 56 4(b)(1) of the Act, 21 U.S.C. section 360bbb-3(b)(1), unless the authorization is terminated or revoked sooner. Performed at [facility], [place] [address], [place], [place] [address]      Radiology Studies: US Venous Img Lower Bilateral (DVT)  Result Date: 10/01/2019 CLINICAL DATA:  Lower extremity edema EXAM: BILATERAL LOWER EXTREMITY VENOUS DOPPLER ULTRASOUND TECHNIQUE: Gray-scale sonography with graded compression, as well as color Doppler and duplex ultrasound were performed to evaluate the lower extremity deep venous systems  from the level of the common femoral vein and including the common femoral, femoral, profunda femoral, popliteal and calf veins including the posterior tibial, peroneal and gastrocnemius veins when visible. The superficial great saphenous vein was also interrogated. Spectral Doppler was utilized to evaluate flow at rest and with distal augmentation maneuvers in the common femoral, femoral and popliteal veins. COMPARISON:  None. FINDINGS: RIGHT LOWER EXTREMITY Common Femoral Vein: No evidence of thrombus. Normal compressibility, respiratory phasicity and response to augmentation. Saphenofemoral Junction: No evidence of thrombus. Normal compressibility and flow on color Doppler imaging. Profunda Femoral Vein: No evidence of thrombus. Normal compressibility and flow on color Doppler imaging. Femoral Vein: No evidence of thrombus. Normal compressibility, respiratory phasicity and response to augmentation. Popliteal Vein: No evidence of thrombus. Normal compressibility, respiratory phasicity and response to augmentation. Calf Veins: No evidence of thrombus. Normal compressibility and flow on color Doppler imaging. Superficial Great Saphenous Vein: No evidence of thrombus. Normal compressibility. Other Findings:  None. LEFT LOWER EXTREMITY Common Femoral Vein: No evidence of thrombus. Normal compressibility, respiratory phasicity and response to augmentation. Saphenofemoral Junction: No evidence of thrombus. Normal compressibility and flow on color Doppler imaging. Profunda Femoral Vein: No evidence of thrombus. Normal compressibility and flow on color Doppler imaging. Femoral Vein: No evidence of thrombus. Normal compressibility, respiratory phasicity and response to augmentation. Popliteal Vein: No evidence of thrombus. Normal compressibility, respiratory phasicity and response to augmentation. Calf Veins: No evidence of thrombus. Normal compressibility and flow on color Doppler imaging. Superficial Great Saphenous  Vein: No evidence of thrombus. Normal compressibility. Other Findings:  None. IMPRESSION: No evidence of deep venous thrombosis in either lower extremity. Electronically Signed   By: Constance Holster M.D.   On: 10/01/2019 00:30   DG Chest Portable 1 View  Result Date: 09/30/2019 CLINICAL DATA:  Shortness of breath EXAM: PORTABLE CHEST 1 VIEW COMPARISON:  09/30/2019 FINDINGS: Minimal left base atelectasis. Right lung clear. Heart is normal size. No effusions or acute bony abnormality. IMPRESSION: Left base atelectasis.  No active disease. Electronically Signed   By: Rolm Baptise M.D.   On: 09/30/2019 15:54    Scheduled Meds: . baclofen  10 mg Oral TID  . clindamycin  300 mg Oral Q6H  . enoxaparin (LOVENOX) injection  40 mg Subcutaneous Q24H  . furosemide  40 mg Intravenous Q8H  . gabapentin  300 mg Oral q morning - 10a   And  . gabapentin  600 mg Oral QHS  . potassium chloride  40 mEq Oral BID  . propranolol  20 mg Oral Q6H  . sodium chloride flush  3 mL Intravenous Q12H   Continuous Infusions: . dextrose 5 % 125 mL with diltiazem (CARDIZEM) 125 mg infusion 17.5 mL/hr at 10/01/19 1314     LOS: 0 days   Time spent: 45 minutes.  Lorella Nimrod, MD [facility]  If 7PM-7AM, please  contact night-coverage [email or web address]  10/01/2019, 2:30 PM   This record has been created using Systems analyst. Errors have been sought and corrected,but may not always be located. Such creation errors do not reflect on the standard of care.

## 2019-10-01 NOTE — Progress Notes (Signed)
OVERNIGHT Patient rebolus with cardizem for a fib with RVR rate 150's, also rescheduled diuresis therapy to start now.

## 2019-10-01 NOTE — ED Notes (Signed)
Dr Reesa Chew contacted this RN and asked RN to increase dose of Cardizem. RN notified MD that pt was receiving 15mg /hr of the Cardizem and this is currently the maximum dose ordered. RN asked MD to place new or modify current orders for RN to increase dose. MD verbalized that she would place new orders for the patient at this time.

## 2019-10-02 DIAGNOSIS — I5031 Acute diastolic (congestive) heart failure: Secondary | ICD-10-CM

## 2019-10-02 DIAGNOSIS — J811 Chronic pulmonary edema: Secondary | ICD-10-CM

## 2019-10-02 LAB — THYROID ANTIBODIES
Thyroglobulin Antibody: 2.8 IU/mL — ABNORMAL HIGH (ref 0.0–0.9)
Thyroperoxidase Ab SerPl-aCnc: 342 IU/mL — ABNORMAL HIGH (ref 0–34)

## 2019-10-02 LAB — HEMOGLOBIN A1C
Hgb A1c MFr Bld: 5.3 % (ref 4.8–5.6)
Mean Plasma Glucose: 105 mg/dL

## 2019-10-02 LAB — CBC
HCT: 42.1 % (ref 39.0–52.0)
Hemoglobin: 14 g/dL (ref 13.0–17.0)
MCH: 29.4 pg (ref 26.0–34.0)
MCHC: 33.3 g/dL (ref 30.0–36.0)
MCV: 88.3 fL (ref 80.0–100.0)
Platelets: 241 10*3/uL (ref 150–400)
RBC: 4.77 MIL/uL (ref 4.22–5.81)
RDW: 12.5 % (ref 11.5–15.5)
WBC: 10.3 10*3/uL (ref 4.0–10.5)
nRBC: 0 % (ref 0.0–0.2)

## 2019-10-02 LAB — COMPREHENSIVE METABOLIC PANEL
ALT: 26 U/L (ref 0–44)
AST: 20 U/L (ref 15–41)
Albumin: 3.8 g/dL (ref 3.5–5.0)
Alkaline Phosphatase: 55 U/L (ref 38–126)
Anion gap: 9 (ref 5–15)
BUN: 13 mg/dL (ref 8–23)
CO2: 26 mmol/L (ref 22–32)
Calcium: 9.3 mg/dL (ref 8.9–10.3)
Chloride: 100 mmol/L (ref 98–111)
Creatinine, Ser: 0.84 mg/dL (ref 0.61–1.24)
GFR calc Af Amer: >60 mL/min (ref >60)
GFR calc non Af Amer: >60 mL/min (ref >60)
Glucose, Bld: 127 mg/dL — ABNORMAL HIGH (ref 70–99)
Potassium: 4.3 mmol/L (ref 3.5–5.1)
Sodium: 135 mmol/L (ref 135–145)
Total Bilirubin: 1.7 mg/dL — ABNORMAL HIGH (ref 0.3–1.2)
Total Protein: 6.4 g/dL — ABNORMAL LOW (ref 6.5–8.1)

## 2019-10-02 LAB — T4: T4, Total: 13.9 ug/dL — ABNORMAL HIGH (ref 4.5–12.0)

## 2019-10-02 LAB — MRSA PCR SCREENING: MRSA by PCR: NEGATIVE

## 2019-10-02 LAB — GLUCOSE, CAPILLARY: Glucose-Capillary: 147 mg/dL — ABNORMAL HIGH (ref 70–99)

## 2019-10-02 LAB — T3, FREE: T3, Free: 13.4 pg/mL — ABNORMAL HIGH (ref 2.0–4.4)

## 2019-10-02 LAB — T3: T3, Total: 327 ng/dL — ABNORMAL HIGH (ref 71–180)

## 2019-10-02 LAB — THYROID STIMULATING IMMUNOGLOBULIN: Thyroid Stimulating Immunoglob: 5.22 IU/L — ABNORMAL HIGH (ref 0.00–0.55)

## 2019-10-02 MED ORDER — DILTIAZEM HCL ER COATED BEADS 180 MG PO CP24
360.0000 mg | ORAL_CAPSULE | Freq: Every day | ORAL | Status: DC
  Administered 2019-10-02 – 2019-10-03 (×2): 360 mg via ORAL
  Filled 2019-10-02 (×3): qty 2

## 2019-10-02 MED ORDER — PROPRANOLOL HCL ER 80 MG PO CP24
80.0000 mg | ORAL_CAPSULE | Freq: Two times a day (BID) | ORAL | Status: DC
  Administered 2019-10-02 – 2019-10-03 (×3): 80 mg via ORAL
  Filled 2019-10-02 (×5): qty 1

## 2019-10-02 MED ORDER — CHLORHEXIDINE GLUCONATE CLOTH 2 % EX PADS
6.0000 | MEDICATED_PAD | Freq: Every day | CUTANEOUS | Status: DC
  Administered 2019-10-02: 6 via TOPICAL

## 2019-10-02 MED ORDER — APIXABAN 5 MG PO TABS
5.0000 mg | ORAL_TABLET | Freq: Two times a day (BID) | ORAL | Status: DC
  Administered 2019-10-02 – 2019-10-03 (×3): 5 mg via ORAL
  Filled 2019-10-02 (×3): qty 1

## 2019-10-02 MED ORDER — METHIMAZOLE 10 MG PO TABS
30.0000 mg | ORAL_TABLET | Freq: Every day | ORAL | Status: DC
  Administered 2019-10-03: 30 mg via ORAL
  Filled 2019-10-02: qty 3

## 2019-10-02 MED ORDER — PROPRANOLOL HCL 20 MG PO TABS: 80.0000 mg | ORAL_TABLET | Freq: Two times a day (BID) | ORAL | Status: DC

## 2019-10-02 NOTE — Progress Notes (Signed)
Progress Note  Patient Name: Dustin Gray Date of Encounter: 10/02/2019  Primary Cardiologist: new to [facility] - consult by [facility]  Subjective   He remains in A. fib with RVR with improved ventricular rates into the low 100s bpm predominantly.  Currently on Cardizem drip and short acting propranolol.  Documented urine output 1.9 L for the admission.  Potassium remains at goal.  Renal function normal.  Started on methimazole.  Inpatient Medications    Scheduled Meds: . baclofen  10 mg Oral TID  . clindamycin  300 mg Oral Q6H  . enoxaparin (LOVENOX) injection  40 mg Subcutaneous Q24H  . furosemide  40 mg Intravenous Q8H  . gabapentin  300 mg Oral q morning - 10a   And  . gabapentin  600 mg Oral QHS  . methimazole  20 mg Oral Daily  . potassium chloride  40 mEq Oral BID  . propranolol  20 mg Oral Q6H  . sodium chloride flush  3 mL Intravenous Q12H   Continuous Infusions: . diltiazem (CARDIZEM) infusion 20 mg/hr (10/02/19 0752)   PRN Meds: acetaminophen **OR** acetaminophen, ondansetron **OR** ondansetron (ZOFRAN) IV, polyethylene glycol   Vital Signs    Vitals:   10/02/19 0900 10/02/19 0930 10/02/19 1000 10/02/19 1030  BP: 122/62 120/69 129/67 130/69  Pulse:      Resp: (!) 21 20 (!) 22 (!) 25  Temp:      TempSrc:      SpO2:      Weight:      Height:        Intake/Output Summary (Last 24 hours) at 10/02/2019 1129 Last data filed at 10/02/2019 1032 Gross per 24 hour  Intake 125 ml  Output 700 ml  Net -575 ml   Filed Weights   09/30/19 1452 10/02/19 0749  Weight: 122.5 kg 123.9 kg    Telemetry    A. fib with RVR with ventricular rates in the low 100s to 1 teens bpm with rare rates into the 120s bpm- Personally Reviewed  ECG    No new tracings- Personally Reviewed  Physical Exam   GEN: No acute distress.   Neck: No JVD. Cardiac:  Tachycardic, irregularly irregular, no murmurs, rubs, or gallops.  Respiratory: Clear to auscultation bilaterally.  GI:  Soft, nontender, non-distended.   MS:  Improved and softer lower extremity pitting edema; No deformity. Neuro:  Alert and oriented x 3; Nonfocal.  Psych: Normal affect.  Labs    Chemistry Recent Labs  Lab 09/30/19 1504 09/30/19 1504 09/30/19 1822 10/01/19 0358 10/01/19 0927 10/02/19 0614  NA 141  --   --  142  --  135  K 4.5  --   --  3.7  --  4.3  CL 109  --   --  106  --  100  CO2 26  --   --  27  --  26  GLUCOSE 128*  --   --  126*  --  127*  BUN 8  --   --  9  --  13  CREATININE 0.93   < > 0.84 0.84  --  0.84  CALCIUM 9.3  --   --  9.0  --  9.3  PROT  --   --   --   --  6.0* 6.4*  ALBUMIN  --   --   --   --  3.7 3.8  AST  --   --   --   --  24  20  ALT  --   --   --   --  31 26  ALKPHOS  --   --   --   --  51 55  BILITOT  --   --   --   --  1.2 1.7*  GFRNONAA >60   < > >60 >60  --  >60  GFRAA >60   < > >60 >60  --  >60  ANIONGAP 6  --   --  9  --  9   < > = values in this interval not displayed.     Hematology Recent Labs  Lab 09/30/19 1822 10/01/19 0358 10/02/19 0614  WBC 5.3 5.6 10.3  RBC 4.95 4.69 4.77  HGB 14.5 13.9 14.0  HCT 44.5 42.0 42.1  MCV 89.9 89.6 88.3  MCH 29.3 29.6 29.4  MCHC 32.6 33.1 33.3  RDW 12.2 12.4 12.5  PLT 194 212 241    Cardiac EnzymesNo results for input(s): TROPONINI in the last 168 hours. No results for input(s): TROPIPOC in the last 168 hours.   BNP Recent Labs  Lab 09/30/19 1504  BNP 200.0*     DDimer No results for input(s): DDIMER in the last 168 hours.   Radiology    US Venous Img Lower Bilateral (DVT)  Result Date: 10/01/2019 IMPRESSION: No evidence of deep venous thrombosis in either lower extremity. Electronically Signed   By: Constance Holster M.D.   On: 10/01/2019 00:30   DG Chest Portable 1 View  Result Date: 09/30/2019 IMPRESSION: Left base atelectasis.  No active disease. Electronically Signed   By: Rolm Baptise M.D.   On: 09/30/2019 15:54   US THYROID  Result Date: 10/01/2019 CLINICAL DATA:   Hyperthyroidism EXAM: THYROID ULTRASOUND TECHNIQUE: Ultrasound examination of the thyroid gland and adjacent soft tissues was performed. COMPARISON:  None. FINDINGS: Parenchymal Echotexture: Moderately heterogenous Isthmus: 2 mm Right lobe: 5.4 x 1.6 x 2.1 cm Left lobe: 5.6 x 1.8 x 1.8 cm _________________________________________________________ Estimated total number of nodules >/= 1 cm: 3 Number of spongiform nodules >/=  2 cm not described below (TR1): 0 Number of mixed cystic and solid nodules >/= 1.5 cm not described below ([place]): 0 _________________________________________________________ Nodule # 1: Location: Right; Inferior Maximum size: 1.0 cm; Other 2 dimensions: 0.9 x 0 point cm Composition: solid/almost completely solid (2) Echogenicity: hyperechoic (1) Shape: not taller-than-wide (0) Margins: ill-defined (0) Echogenic foci: macrocalcifications (1) ACR TI-RADS total points: 4. ACR TI-RADS risk category: TR4 (4-6 points). ACR TI-RADS recommendations: *Given size (>/= 1 - 1.4 cm) and appearance, a follow-up ultrasound in 1 year should be considered based on TI-RADS criteria. _________________________________________________________ Nodule # 2: Location: Left; Mid Maximum size: 1.0 cm; Other 2 dimensions: 0.6 x 0.5 cm Composition: solid/almost completely solid (2) Echogenicity: hypoechoic (2) Shape: not taller-than-wide (0) Margins: ill-defined (0) Echogenic foci: none (0) ACR TI-RADS total points: 4. ACR TI-RADS risk category: TR4 (4-6 points). ACR TI-RADS recommendations: *Given size (>/= 1 - 1.4 cm) and appearance, a follow-up ultrasound in 1 year should be considered based on TI-RADS criteria. _________________________________________________________ Nodule # 3: Location: Left; Inferior Maximum size: 1.2 cm; Other 2 dimensions: 1.1 x 0.8 cm Composition: solid/almost completely solid (2) Echogenicity: isoechoic (1) Shape: not taller-than-wide (0) Margins: smooth (0) Echogenic foci: none (0) ACR TI-RADS  total points: 3. ACR TI-RADS risk category: TR3 (3 points). ACR TI-RADS recommendations: Given size (<1.4 cm) and appearance, this nodule does NOT meet TI-RADS criteria for biopsy or dedicated follow-up. _________________________________________________________  Moderate thyroid heterogeneity without hypervascularity. No regional adenopathy. IMPRESSION: Bilateral TR 4 nodules (nodules 1 and 2) meet criteria follow-up in 1 year. 1.2 cm left inferior TR 3 nodule does not meet criteria for biopsy or follow-up. Nonspecific thyroid heterogeneity The above is in keeping with the ACR TI-RADS recommendations - J Am Coll Radiol 2017;14:587-595. Electronically Signed   By: Jerilynn Mages.  Shick M.D.   On: 10/01/2019 16:23    Cardiac Studies   2D echo 10/01/2019: 1. Left ventricular ejection fraction, by estimation, is 55 to 60%. The  left ventricle has normal function. The left ventricle has no regional  wall motion abnormalities. Left ventricular diastolic parameters are  indeterminate.  2. Right ventricular systolic function is normal. The right ventricular  size is normal. There is mildly elevated pulmonary artery systolic  pressure. The estimated right ventricular systolic pressure is [id number] mmHg.  3. Rhythm is atrial fibrillation, rate >120 bpm   Patient Profile     64 y.o. male with history of multiple sclerosis, lymphedema, and spinal stenosis who we are seeing for Afib with RVR in the setting of newly diagnosed hyperthyroidism.  Assessment & Plan    1. Afib with RVR: -His ventricular rates remain tachycardic though overall are improving with rates predominantly in the low 100s bpm -Transition from Cardizem drip at 20 mg/h to long-acting Cardizem CD 360 mg daily -Consolidate short acting propranolol to long-acting 80 mg twice daily -This is overall a difficult situation as his ventricular rates will be difficult to control until his hyperthyroidism is treated -TSH < 0.010 with free T4 3.29 -Will need  adequate heart rate control prior to discharge  -CHADS2VASc 0 -Continue Eliquis 5 mg twice daily though he will not require long-term anticoagulation -No plans for DCCV at this time, as he will not hold sinus rhythm until his thyroid is treated  -Echo as above  2. Hyperthyroidism: -Driving his Afib with RVR, as this is treated his rates will improve  -Started on methimazole -Thyroid ultrasound as above -Radioactive uptake scan pending -Endocrinology has been consulted per IM -Management per primary   3. Lower extremity swelling: -Longstanding issue, has been evaluated by vascular surgery at [facility] with recommendation for compression stockings -He is volume up and likely in the setting of his Afib with RVR -Worse over the past couple of weeks -Likely exacerbated by the above -Will need to monitor lower extremity swelling in the setting of calcium channel blocker usage -Echo as above -Continue IV Lasix to 40 mg q 8 hours with KCl repletion   For questions or updates, please contact [place] Please consult [email or web address] for contact info under Cardiology/STEMI.    Signed, Christell Faith, PA-C [facility] Pager: [phone] 10/02/2019, 11:29 AM

## 2019-10-02 NOTE — Progress Notes (Signed)
Slept soundly since admission. VSS. Daughter in room.

## 2019-10-02 NOTE — ED Notes (Signed)
ED TO INPATIENT HANDOFF REPORT  ED Nurse Name and Phone #: [phone]  S Name/Age/Gender Dustin Gray 64 y.o. male Room/Bed: ED04A/ED04A  Code Status   Code Status: Full Code  Home/SNF/Other Home Patient oriented to: self, place, time and situation Is this baseline? Yes   Triage Complete: Triage complete  Chief Complaint Atrial fibrillation with RVR ([place]) [I48.91]  Triage Note Pt here for [facility].  Started a couple weeks ago. Also has BLE swelling worse than normal.  New onset afib RVR in triage noted; pt denies hx of same.  Not on blood thinners. Is having chest pain    Allergies Allergies  Allergen Reactions  . Cephalexin Hives  . Ancef [Cefazolin Sodium] Rash  . Cefadroxil Rash  . Cefazolin Rash    Level of Care/Admitting Diagnosis ED Disposition    ED Disposition Condition [facility] Area: [place] [[id number]]  Level of Care: Stepdown [14]  Covid Evaluation: Confirmed COVID Negative  Diagnosis: Atrial fibrillation with RVR [facility]) [id number]  Admitting Physician: Lorella Nimrod [id number]  Attending Physician: Lorella Nimrod [id number]  Estimated length of stay: past midnight tomorrow  Certification:: I certify this patient will need inpatient services for at least 2 midnights       B Medical/Surgery History Past Medical History:  Diagnosis Date  . Abscess    groin  . Arthritis    lower spine  . Erectile dysfunction   . Low testosterone   . Lumbar herniated disc    L5  . Multiple sclerosis ([place])   . Staph aureus infection    Past Surgical History:  Procedure Laterality Date  . COLONOSCOPY WITH PROPOFOL N/A 07/04/2016   Procedure: COLONOSCOPY WITH PROPOFOL;  Surgeon: [name], MD;  Location: [place];  Service: Endoscopy;  Laterality: N/A;  . [place]  . MASS EXCISION  1960  . POLYPECTOMY  07/04/2016   Procedure: POLYPECTOMY;  Surgeon: [name], MD;  Location: [place];   Service: Endoscopy;;  . TONSILLECTOMY       A IV Location/Drains/Wounds Patient Lines/Drains/Airways Status   Active Line/Drains/Airways    Name:   Placement date:   Placement time:   Site:   Days:   Peripheral IV 09/30/19 Left Forearm   09/30/19    1504    Forearm   2   Peripheral IV 09/30/19 Left Wrist   09/30/19    1824    Wrist   2   External Urinary Catheter   10/01/19    0111    --   1   Incision (Closed) 07/04/16 Rectum Other (Comment)   07/04/16    0808     1185          Intake/Output Last 24 hours  Intake/Output Summary (Last 24 hours) at 10/02/2019 1307 Last data filed at 10/02/2019 1032 Gross per 24 hour  Intake 125 ml  Output 700 ml  Net -575 ml    Labs/Imaging Results for orders placed or performed during the hospital encounter of 09/30/19 (from the past 48 hour(s))  Basic metabolic panel     Status: Abnormal   Collection Time: 09/30/19  3:04 PM  Result Value Ref Range   Sodium 141 135 - 145 mmol/L   Potassium 4.5 3.5 - 5.1 mmol/L   Chloride 109 98 - 111 mmol/L   CO2 26 22 - 32 mmol/L   Glucose, Bld 128 (H) 70 - 99 mg/dL    Comment:  Glucose reference range applies only to samples taken after fasting for at least 8 hours.   BUN 8 8 - 23 mg/dL   Creatinine, Ser 0.93 0.61 - 1.24 mg/dL   Calcium 9.3 8.9 - 10.3 mg/dL   GFR calc non Af Amer >60 >60 mL/min   GFR calc Af Amer >60 >60 mL/min   Anion gap 6 5 - 15    Comment: Performed at [facility], [address] [place], [place] [address]  CBC     Status: None   Collection Time: 09/30/19  3:04 PM  Result Value Ref Range   WBC 5.6 4.0 - 10.5 K/uL   RBC 4.85 4.22 - 5.81 MIL/uL   Hemoglobin 14.3 13.0 - 17.0 g/dL   HCT 43.9 39.0 - 52.0 %   MCV 90.5 80.0 - 100.0 fL   MCH 29.5 26.0 - 34.0 pg   MCHC 32.6 30.0 - 36.0 g/dL   RDW 12.1 11.5 - 15.5 %   Platelets 213 150 - 400 K/uL   nRBC 0.0 0.0 - 0.2 %    Comment: Performed at [facility], [address] [place], [place] [address]  Brain  natriuretic peptide     Status: Abnormal   Collection Time: 09/30/19  3:04 PM  Result Value Ref Range   B Natriuretic Peptide 200.0 (H) 0.0 - 100.0 pg/mL    Comment: Performed at [facility], [place], [place] [address]  Troponin I (High Sensitivity)     Status: None   Collection Time: 09/30/19  3:04 PM  Result Value Ref Range   Troponin I (High Sensitivity) 7 <18 ng/L    Comment: (NOTE) Elevated high sensitivity troponin I (hsTnI) values and significant  changes across serial measurements may suggest ACS but many other  chronic and acute conditions are known to elevate hsTnI results.  Refer to the "Links" section for chest pain algorithms and additional  guidance. Performed at [facility], [place], [place] [address]   SARS CORONAVIRUS 2 (TAT 6-24 HRS) Nasopharyngeal Nasopharyngeal Swab     Status: None   Collection Time: 09/30/19  4:46 PM   Specimen: Nasopharyngeal Swab  Result Value Ref Range   SARS Coronavirus 2 NEGATIVE NEGATIVE    Comment: (NOTE) SARS-CoV-2 target nucleic acids are NOT DETECTED. The SARS-CoV-2 RNA is generally detectable in upper and lower respiratory specimens during the acute phase of infection. Negative results do not preclude SARS-CoV-2 infection, do not rule out co-infections with other pathogens, and should not be used as the sole basis for treatment or other patient management decisions. Negative results must be combined with clinical observations, patient history, and epidemiological information. The expected result is Negative. Fact Sheet for Patients: SugarRoll.be Fact Sheet for Healthcare Providers: https://www.woods-mathews.com/ This test is not yet approved or cleared by the [place] FDA and  has been authorized for detection and/or diagnosis of SARS-CoV-2 by FDA under an Emergency Use Authorization (EUA). This EUA will remain  in effect (meaning  this test can be used) for the duration of the COVID-19 declaration under Section 56 4(b)(1) of the Act, 21 U.S.C. section 360bbb-3(b)(1), unless the authorization is terminated or revoked sooner. Performed at [facility], [place] [address] [place], [place] [address]   Troponin I (High Sensitivity)     Status: None   Collection Time: 09/30/19  6:22 PM  Result Value Ref Range   Troponin I (High Sensitivity) 8 <18 ng/L  Comment: (NOTE) Elevated high sensitivity troponin I (hsTnI) values and significant  changes across serial measurements may suggest ACS but many other  chronic and acute conditions are known to elevate hsTnI results.  Refer to the "Links" section for chest pain algorithms and additional  guidance. Performed at [facility], [address] [place], [place] [address]   HIV Antibody (routine testing w rflx)     Status: None   Collection Time: 09/30/19  6:22 PM  Result Value Ref Range   HIV Screen 4th Generation wRfx NON REACTIVE NON REACTIVE    Comment: Performed at [facility] [address], [place] [address]  CBC     Status: None   Collection Time: 09/30/19  6:22 PM  Result Value Ref Range   WBC 5.3 4.0 - 10.5 K/uL   RBC 4.95 4.22 - 5.81 MIL/uL   Hemoglobin 14.5 13.0 - 17.0 g/dL   HCT 44.5 39.0 - 52.0 %   MCV 89.9 80.0 - 100.0 fL   MCH 29.3 26.0 - 34.0 pg   MCHC 32.6 30.0 - 36.0 g/dL   RDW 12.2 11.5 - 15.5 %   Platelets 194 150 - 400 K/uL   nRBC 0.0 0.0 - 0.2 %    Comment: Performed at [facility], [address] [place], [place] [address]  Creatinine, serum     Status: None   Collection Time: 09/30/19  6:22 PM  Result Value Ref Range   Creatinine, Ser 0.84 0.61 - 1.24 mg/dL   GFR calc non Af Amer >60 >60 mL/min   GFR calc Af Amer >60 >60 mL/min    Comment: Performed at [facility], [address] [place], [place] [address]  TSH     Status: Abnormal   Collection Time: 09/30/19  6:22 PM  Result  Value Ref Range   TSH <0.010 (L) 0.350 - 4.500 uIU/mL    Comment: Performed by a 3rd Generation assay with a functional sensitivity of <=0.01 uIU/mL. Performed at [facility], [address] [place], [place] [address]   Hemoglobin A1c     Status: None   Collection Time: 09/30/19  6:22 PM  Result Value Ref Range   Hgb A1c MFr Bld 5.3 4.8 - 5.6 %    Comment: (NOTE)         Prediabetes: 5.7 - 6.4         Diabetes: >6.4         Glycemic control for adults with diabetes: <7.0    Mean Plasma Glucose 105 mg/dL    Comment: (NOTE) Performed At: [facility] [place], [place] [id number] Rush Farmer MD [id number]   Urinalysis, Routine w reflex microscopic     Status: Abnormal   Collection Time: 09/30/19  8:47 PM  Result Value Ref Range   Color, Urine YELLOW (A) YELLOW   APPearance CLEAR (A) CLEAR   Specific Gravity, Urine 1.018 1.005 - 1.030   pH 6.0 5.0 - 8.0   Glucose, UA NEGATIVE NEGATIVE mg/dL   Hgb urine dipstick NEGATIVE NEGATIVE   Bilirubin Urine NEGATIVE NEGATIVE   Ketones, ur 5 (A) NEGATIVE mg/dL   Protein, ur NEGATIVE NEGATIVE mg/dL   Nitrite NEGATIVE NEGATIVE   Leukocytes,Ua NEGATIVE NEGATIVE    Comment: Performed at [facility], [address], [place], [place] [id number]  Basic metabolic panel     Status: Abnormal   Collection Time: 10/01/19  3:58 AM  Result Value Ref Range  Sodium 142 135 - 145 mmol/L   Potassium 3.7 3.5 - 5.1 mmol/L   Chloride 106 98 - 111 mmol/L   CO2 27 22 - 32 mmol/L   Glucose, Bld 126 (H) 70 - 99 mg/dL    Comment: Glucose reference range applies only to samples taken after fasting for at least 8 hours.   BUN 9 8 - 23 mg/dL   Creatinine, Ser 0.84 0.61 - 1.24 mg/dL   Calcium 9.0 8.9 - 10.3 mg/dL   GFR calc non Af Amer >60 >60 mL/min   GFR calc Af Amer >60 >60 mL/min   Anion gap 9 5 - 15    Comment: Performed at [facility], [address] [place], [place] [address]  CBC     Status:  None   Collection Time: 10/01/19  3:58 AM  Result Value Ref Range   WBC 5.6 4.0 - 10.5 K/uL   RBC 4.69 4.22 - 5.81 MIL/uL   Hemoglobin 13.9 13.0 - 17.0 g/dL   HCT 42.0 39.0 - 52.0 %   MCV 89.6 80.0 - 100.0 fL   MCH 29.6 26.0 - 34.0 pg   MCHC 33.1 30.0 - 36.0 g/dL   RDW 12.4 11.5 - 15.5 %   Platelets 212 150 - 400 K/uL   nRBC 0.0 0.0 - 0.2 %    Comment: Performed at [facility], [address] [place], [place] [address]  Lipid panel     Status: Abnormal   Collection Time: 10/01/19  3:58 AM  Result Value Ref Range   Cholesterol 108 0 - 200 mg/dL   Triglycerides 81 <150 mg/dL   HDL 35 (L) >40 mg/dL   Total CHOL/HDL Ratio 3.1 RATIO   VLDL 16 0 - 40 mg/dL   LDL Cholesterol 57 0 - 99 mg/dL    Comment:        Total Cholesterol/HDL:CHD Risk Coronary Heart Disease Risk Table                     Men   Women  1/2 Average Risk   3.4   3.3  Average Risk       5.0   4.4  2 X Average Risk   9.6   7.1  3 X Average Risk  23.4   11.0        Use the calculated Patient Ratio above and the CHD Risk Table to determine the patient's CHD Risk.        ATP III CLASSIFICATION (LDL):  <100     mg/dL   Optimal  100-129  mg/dL   Near or Above                    Optimal  130-159  mg/dL   Borderline  160-189  mg/dL   High  >190     mg/dL   Very High Performed at [facility], [address] [place], [place] [address]   Magnesium     Status: None   Collection Time: 10/01/19  3:58 AM  Result Value Ref Range   Magnesium 2.0 1.7 - 2.4 mg/dL    Comment: Performed at [facility], [address] [place],  [address]  T3, Free     Status: Abnormal   Collection Time: 10/01/19  9:27 AM  Result Value Ref Range   T3, Free 13.4 (H) 2.0 - 4.4 pg/mL    Comment: (NOTE) Performed At: [facility] [address]  [address] [place], [place] [id number] Rush Farmer MD [id number]   T4     Status: Abnormal   Collection Time: 10/01/19  9:27 AM  Result Value Ref Range    T4, Total 13.9 (H) 4.5 - 12.0 ug/dL    Comment: (NOTE) Performed At: [facility] [place], [place] [id number] Rush Farmer MD [id number]   T4, free     Status: Abnormal   Collection Time: 10/01/19  9:27 AM  Result Value Ref Range   Free T4 3.29 (H) 0.61 - 1.12 ng/dL    Comment: (NOTE) Biotin ingestion may interfere with free T4 tests. If the results are inconsistent with the TSH level, previous test results, or the clinical presentation, then consider biotin interference. If needed, order repeat testing after stopping biotin. Performed at [facility], [address] [place], [place] [address]   T3     Status: Abnormal   Collection Time: 10/01/19  9:27 AM  Result Value Ref Range   T3, Total 327 (H) 71 - 180 ng/dL    Comment: (NOTE) Performed At: [facility] [place], [place] [id number] Rush Farmer MD [id number]   Thyroid antibodies     Status: Abnormal   Collection Time: 10/01/19  9:27 AM  Result Value Ref Range   Thyroperoxidase Ab SerPl-aCnc 342 (H) 0 - 34 IU/mL   Thyroglobulin Antibody 2.8 (H) 0.0 - 0.9 IU/mL    Comment: (NOTE) Thyroglobulin Antibody measured by USAA Methodology Performed At: [facility] [place], [place] [id number] Rush Farmer MD [id number]   Thyroid stimulating immunoglobulin     Status: Abnormal   Collection Time: 10/01/19  9:27 AM  Result Value Ref Range   Thyroid Stimulating Immunoglob 5.22 (H) 0.00 - 0.55 IU/L    Comment: (NOTE) Performed At: [facility] [place], [place] [id number] Rush Farmer MD [id number]   Hepatic function panel     Status: Abnormal   Collection Time: 10/01/19  9:27 AM  Result Value Ref Range   Total Protein 6.0 (L) 6.5 - 8.1 g/dL   Albumin 3.7 3.5 - 5.0 g/dL   AST 24 15 - 41 U/L   ALT 31 0 - 44 U/L   Alkaline Phosphatase 51 38 - 126 U/L   Total Bilirubin 1.2 0.3 - 1.2 mg/dL    Bilirubin, Direct 0.2 0.0 - 0.2 mg/dL   Indirect Bilirubin 1.0 (H) 0.3 - 0.9 mg/dL    Comment: Performed at [facility], [address] [place], [place] [address]  Comprehensive metabolic panel     Status: Abnormal   Collection Time: 10/02/19  6:14 AM  Result Value Ref Range   Sodium 135 135 - 145 mmol/L   Potassium 4.3 3.5 - 5.1 mmol/L   Chloride 100 98 - 111 mmol/L   CO2 26 22 - 32 mmol/L   Glucose, Bld 127 (H) 70 - 99 mg/dL    Comment: Glucose reference range applies only to samples taken after fasting for at least 8 hours.   BUN 13 8 - 23 mg/dL   Creatinine, Ser 0.84 0.61 - 1.24 mg/dL   Calcium 9.3 8.9 - 10.3 mg/dL   Total Protein 6.4 (L) 6.5 - 8.1 g/dL   Albumin 3.8 3.5 - 5.0 g/dL   AST 20 15 - 41 U/L   ALT 26 0 - 44 U/L   Alkaline Phosphatase 55 38 - 126 U/L   Total Bilirubin 1.7 (H) 0.3 - 1.2 mg/dL  GFR calc non Af Amer >60 >60 mL/min   GFR calc Af Amer >60 >60 mL/min   Anion gap 9 5 - 15    Comment: Performed at [facility], [address] [place], [place] [address]  CBC     Status: None   Collection Time: 10/02/19  6:14 AM  Result Value Ref Range   WBC 10.3 4.0 - 10.5 K/uL   RBC 4.77 4.22 - 5.81 MIL/uL   Hemoglobin 14.0 13.0 - 17.0 g/dL   HCT 42.1 39.0 - 52.0 %   MCV 88.3 80.0 - 100.0 fL   MCH 29.4 26.0 - 34.0 pg   MCHC 33.3 30.0 - 36.0 g/dL   RDW 12.5 11.5 - 15.5 %   Platelets 241 150 - 400 K/uL   nRBC 0.0 0.0 - 0.2 %    Comment: Performed at [facility], [address] [place],  [address]   US Venous Img Lower Bilateral (DVT)  Result Date: 10/01/2019 CLINICAL DATA:  Lower extremity edema EXAM: BILATERAL LOWER EXTREMITY VENOUS DOPPLER ULTRASOUND TECHNIQUE: Gray-scale sonography with graded compression, as well as color Doppler and duplex ultrasound were performed to evaluate the lower extremity deep venous systems from the level of the common femoral vein and including the common femoral, femoral, profunda femoral,  popliteal and calf veins including the posterior tibial, peroneal and gastrocnemius veins when visible. The superficial great saphenous vein was also interrogated. Spectral Doppler was utilized to evaluate flow at rest and with distal augmentation maneuvers in the common femoral, femoral and popliteal veins. COMPARISON:  None. FINDINGS: RIGHT LOWER EXTREMITY Common Femoral Vein: No evidence of thrombus. Normal compressibility, respiratory phasicity and response to augmentation. Saphenofemoral Junction: No evidence of thrombus. Normal compressibility and flow on color Doppler imaging. Profunda Femoral Vein: No evidence of thrombus. Normal compressibility and flow on color Doppler imaging. Femoral Vein: No evidence of thrombus. Normal compressibility, respiratory phasicity and response to augmentation. Popliteal Vein: No evidence of thrombus. Normal compressibility, respiratory phasicity and response to augmentation. Calf Veins: No evidence of thrombus. Normal compressibility and flow on color Doppler imaging. Superficial Great Saphenous Vein: No evidence of thrombus. Normal compressibility. Other Findings:  None. LEFT LOWER EXTREMITY Common Femoral Vein: No evidence of thrombus. Normal compressibility, respiratory phasicity and response to augmentation. Saphenofemoral Junction: No evidence of thrombus. Normal compressibility and flow on color Doppler imaging. Profunda Femoral Vein: No evidence of thrombus. Normal compressibility and flow on color Doppler imaging. Femoral Vein: No evidence of thrombus. Normal compressibility, respiratory phasicity and response to augmentation. Popliteal Vein: No evidence of thrombus. Normal compressibility, respiratory phasicity and response to augmentation. Calf Veins: No evidence of thrombus. Normal compressibility and flow on color Doppler imaging. Superficial Great Saphenous Vein: No evidence of thrombus. Normal compressibility. Other Findings:  None. IMPRESSION: No evidence of  deep venous thrombosis in either lower extremity. Electronically Signed   By: Constance Holster M.D.   On: 10/01/2019 00:30   DG Chest Portable 1 View  Result Date: 09/30/2019 CLINICAL DATA:  Shortness of breath EXAM: PORTABLE CHEST 1 VIEW COMPARISON:  09/30/2019 FINDINGS: Minimal left base atelectasis. Right lung clear. Heart is normal size. No effusions or acute bony abnormality. IMPRESSION: Left base atelectasis.  No active disease. Electronically Signed   By: Rolm Baptise M.D.   On: 09/30/2019 15:54   ECHOCARDIOGRAM COMPLETE  Result Date: 10/01/2019    ECHOCARDIOGRAM REPORT   Patient Name:   Dustin [facility] Date of Exam: 10/01/2019 Medical Rec #:  [id number]  Height:       74.0 in Accession #:    [id number]      Weight:       270.0 lb Date of Birth:  July 16, 1955      BSA:          2.469 m Patient Age:    14 years        BP:           147/64 mmHg Patient Gender: M               HR:           114 bpm. Exam Location:  [facility] Procedure: 2D Echo, Color Doppler and Cardiac Doppler Indications:     R06.00 Dyspnea  History:         Patient has no prior history of Echocardiogram examinations.                  Arrythmias:Atrial Fibrillation; Signs/Symptoms:Palpitations.  Sonographer:     Charmayne Sheer RDCS (AE) Referring Phys:  [place] Diagnosing Phys: Ida Rogue MD  Sonographer Comments: Suboptimal apical window. IMPRESSIONS  1. Left ventricular ejection fraction, by estimation, is 55 to 60%. The left ventricle has normal function. The left ventricle has no regional wall motion abnormalities. Left ventricular diastolic parameters are indeterminate.  2. Right ventricular systolic function is normal. The right ventricular size is normal. There is mildly elevated pulmonary artery systolic pressure. The estimated right ventricular systolic pressure is Q000111Q mmHg.  3. Rhythm is atrial fibrillation, rate >120 bpm FINDINGS  Left Ventricle: Left ventricular ejection fraction, by estimation, is 55 to 60%. The  left ventricle has normal function. The left ventricle has no regional wall motion abnormalities. The left ventricular internal cavity size was normal in size. There is  no left ventricular hypertrophy. Left ventricular diastolic parameters are indeterminate. Right Ventricle: The right ventricular size is normal. No increase in right ventricular wall thickness. Right ventricular systolic function is normal. There is mildly elevated pulmonary artery systolic pressure. The tricuspid regurgitant velocity is 2.48  m/s, and with an assumed right atrial pressure of 10 mmHg, the estimated right ventricular systolic pressure is Q000111Q mmHg. Left Atrium: Left atrial size was normal in size. Right Atrium: Right atrial size was normal in size. Pericardium: There is no evidence of pericardial effusion. Mitral Valve: The mitral valve is normal in structure. Normal mobility of the mitral valve leaflets. No evidence of mitral valve regurgitation. No evidence of mitral valve stenosis. MV peak gradient, 9.7 mmHg. The mean mitral valve gradient is 3.0 mmHg. Tricuspid Valve: The tricuspid valve is normal in structure. Tricuspid valve regurgitation is mild . No evidence of tricuspid stenosis. Aortic Valve: The aortic valve is normal in structure. Aortic valve regurgitation is not visualized. No aortic stenosis is present. Aortic valve mean gradient measures 8.0 mmHg. Aortic valve peak gradient measures 14.3 mmHg. Aortic valve area, by VTI measures 1.82 cm. Pulmonic Valve: The pulmonic valve was normal in structure. Pulmonic valve regurgitation is not visualized. No evidence of pulmonic stenosis. Aorta: The aortic root is normal in size and structure. There is mild dilatation of the aortic root measuring 40 mm. Venous: The inferior vena cava is normal in size with greater than 50% respiratory variability, suggesting right atrial pressure of 3 mmHg. IAS/Shunts: No atrial level shunt detected by color flow Doppler.  LEFT VENTRICLE PLAX 2D  LVIDd:         4.24 cm  Diastology LVIDs:  3.25 cm  LV e' lateral:   8.05 cm/s LV PW:         1.25 cm  LV E/e' lateral: 16.9 LV IVS:        1.06 cm  LV e' medial:    8.70 cm/s LVOT diam:     2.20 cm  LV E/e' medial:  15.7 LV SV:         41 LV SV Index:   16 LVOT Area:     3.80 cm  LEFT ATRIUM             Index LA diam:        2.90 cm 1.17 cm/m LA Vol (A2C):   42.8 ml 17.33 ml/m LA Vol (A4C):   42.3 ml 17.13 ml/m LA Biplane Vol: 44.0 ml 17.82 ml/m  AORTIC VALVE                    PULMONIC VALVE AV Area (Vmax):    1.61 cm     PV Vmax:       1.47 m/s AV Area (Vmean):   1.63 cm     PV Vmean:      94.900 cm/s AV Area (VTI):     1.82 cm     PV VTI:        0.236 m AV Vmax:           189.00 cm/s  PV Peak grad:  8.6 mmHg AV Vmean:          130.000 cm/s PV Mean grad:  4.0 mmHg AV VTI:            0.224 m AV Peak Grad:      14.3 mmHg AV Mean Grad:      8.0 mmHg LVOT Vmax:         80.20 cm/s LVOT Vmean:        55.600 cm/s LVOT VTI:          0.107 m LVOT/AV VTI ratio: 0.48  AORTA Ao Root diam: 4.00 cm MITRAL VALVE                TRICUSPID VALVE MV Area (PHT): 3.84 cm     TR Peak grad:   24.6 mmHg MV Peak grad:  9.7 mmHg     TR Vmax:        248.00 cm/s MV Mean grad:  3.0 mmHg MV Vmax:       1.56 m/s     SHUNTS MV Vmean:      82.7 cm/s    Systemic VTI:  0.11 m MV Decel Time: 197 msec     Systemic Diam: 2.20 cm MV E velocity: 136.33 cm/s Ida Rogue MD Electronically signed by Ida Rogue MD Signature Date/Time: 10/01/2019/7:35:35 PM    Final (Updated)    US THYROID  Result Date: 10/01/2019 CLINICAL DATA:  Hyperthyroidism EXAM: THYROID ULTRASOUND TECHNIQUE: Ultrasound examination of the thyroid gland and adjacent soft tissues was performed. COMPARISON:  None. FINDINGS: Parenchymal Echotexture: Moderately heterogenous Isthmus: 2 mm Right lobe: 5.4 x 1.6 x 2.1 cm Left lobe: 5.6 x 1.8 x 1.8 cm _________________________________________________________ Estimated total number of nodules >/= 1 cm: 3 Number of  spongiform nodules >/=  2 cm not described below (TR1): 0 Number of mixed cystic and solid nodules >/= 1.5 cm not described below (Gail): 0 _________________________________________________________ Nodule # 1: Location: Right; Inferior Maximum size: 1.0 cm; Other 2 dimensions: 0.9 x 0 point cm Composition: solid/almost completely solid (  2) Echogenicity: hyperechoic (1) Shape: not taller-than-wide (0) Margins: ill-defined (0) Echogenic foci: macrocalcifications (1) ACR TI-RADS total points: 4. ACR TI-RADS risk category: TR4 (4-6 points). ACR TI-RADS recommendations: *Given size (>/= 1 - 1.4 cm) and appearance, a follow-up ultrasound in 1 year should be considered based on TI-RADS criteria. _________________________________________________________ Nodule # 2: Location: Left; Mid Maximum size: 1.0 cm; Other 2 dimensions: 0.6 x 0.5 cm Composition: solid/almost completely solid (2) Echogenicity: hypoechoic (2) Shape: not taller-than-wide (0) Margins: ill-defined (0) Echogenic foci: none (0) ACR TI-RADS total points: 4. ACR TI-RADS risk category: TR4 (4-6 points). ACR TI-RADS recommendations: *Given size (>/= 1 - 1.4 cm) and appearance, a follow-up ultrasound in 1 year should be considered based on TI-RADS criteria. _________________________________________________________ Nodule # 3: Location: Left; Inferior Maximum size: 1.2 cm; Other 2 dimensions: 1.1 x 0.8 cm Composition: solid/almost completely solid (2) Echogenicity: isoechoic (1) Shape: not taller-than-wide (0) Margins: smooth (0) Echogenic foci: none (0) ACR TI-RADS total points: 3. ACR TI-RADS risk category: TR3 (3 points). ACR TI-RADS recommendations: Given size (<1.4 cm) and appearance, this nodule does NOT meet TI-RADS criteria for biopsy or dedicated follow-up. _________________________________________________________ Moderate thyroid heterogeneity without hypervascularity. No regional adenopathy. IMPRESSION: Bilateral TR 4 nodules (nodules 1 and 2) meet  criteria follow-up in 1 year. 1.2 cm left inferior TR 3 nodule does not meet criteria for biopsy or follow-up. Nonspecific thyroid heterogeneity The above is in keeping with the ACR TI-RADS recommendations - J Am Coll Radiol 2017;14:587-595. Electronically Signed   By: Jerilynn Mages.  Shick M.D.   On: 10/01/2019 16:23    Pending Labs Unresulted Labs (From admission, onward)    Start     Ordered   10/07/19 0500  Creatinine, serum  (enoxaparin (LOVENOX)    CrCl >/= 30 ml/min)  Weekly,   STAT    Comments: while on enoxaparin therapy    09/30/19 1735          Vitals/Pain Today's Vitals   10/02/19 1030 10/02/19 1100 10/02/19 1130 10/02/19 1200  BP: 130/69 123/72 130/63 122/79  Pulse:   (!) 102 (!) 112  Resp: (!) 25 (!) 22 (!) 24 19  Temp:      TempSrc:      SpO2:      Weight:      Height:      PainSc:        Isolation Precautions No active isolations  Medications Medications  clindamycin (CLEOCIN) capsule 300 mg (300 mg Oral Given 10/02/19 0756)  enoxaparin (LOVENOX) injection 40 mg (40 mg Subcutaneous Given 10/01/19 2225)  sodium chloride flush (NS) 0.9 % injection 3 mL (3 mLs Intravenous Given 10/02/19 1135)  acetaminophen (TYLENOL) tablet 650 mg (has no administration in time range)    Or  acetaminophen (TYLENOL) suppository 650 mg (has no administration in time range)  polyethylene glycol (MIRALAX / GLYCOLAX) packet 17 g (has no administration in time range)  ondansetron (ZOFRAN) tablet 4 mg (has no administration in time range)    Or  ondansetron (ZOFRAN) injection 4 mg (has no administration in time range)  baclofen (LIORESAL) tablet 10 mg (10 mg Oral Given 10/02/19 1133)  gabapentin (NEURONTIN) capsule 300 mg (300 mg Oral Given 10/02/19 1133)    And  gabapentin (NEURONTIN) capsule 600 mg (600 mg Oral Given 10/01/19 2223)  furosemide (LASIX) injection 40 mg (40 mg Intravenous Given 10/02/19 0752)  potassium chloride SA (KLOR-CON) CR tablet 40 mEq (40 mEq Oral Given 10/02/19 1132)  methimazole  (TAPAZOLE) tablet 20 mg (  20 mg Oral Given 10/02/19 1134)  diltiazem (CARDIZEM CD) 24 hr capsule 360 mg (has no administration in time range)  propranolol (INDERAL) tablet 80 mg (has no administration in time range)  diltiazem (CARDIZEM) injection 10 mg (10 mg Intravenous Given 09/30/19 1536)  diltiazem (CARDIZEM) tablet 30 mg (30 mg Oral Given 09/30/19 1644)  diltiazem (CARDIZEM) injection 5 mg (5 mg Intravenous Given 09/30/19 1648)  diltiazem (CARDIZEM) injection 20 mg (20 mg Intravenous Given 10/01/19 0645)    Mobility walks with device Low fall risk   Focused Assessments    R Recommendations: See Admitting Provider Note  Report given to:

## 2019-10-02 NOTE — Progress Notes (Signed)
PROGRESS NOTE    Dustin Gray  [id number] DOB: August 29, 1955 DOA: 09/30/2019 PCP: [name], MD   Brief Narrative:  Dustin Gray is a 64 y.o. male with medical history significant of multiple sclerosis, and back pain sent to ED from urgent care where he presented with complaints of 2 weeks of worsening dyspnea, generalized malaise and lower extremity edema.  Found to have new onset A. fib with RVR.  No chest pain.  No orthopnea or PND.  Patient denies any prior cardiac history.  No recent upper respiratory symptoms.  COVID-19 negative.  Given diltiazem bolus and started on infusion.  Found to have undetectable TSH.  Subjective: No new complaints today.  We discussed about the diagnosis of Graves' disease and he is need to follow-up with endocrinology.  Apparently patient's daughter has thyroidectomy done due to cancer and elder daughter is also having some underlying thyroid issues but he was not sure about the diagnosis.  Assessment & Plan:   Active Problems:   Atrial fibrillation with rapid ventricular response (HCC)   Atrial fibrillation with RVR (HCC)  New onset A. fib with RVR.   Most likely secondary to hyperthyroidism.  Patient remained between 110-120 on diltiazem infusion at the rate of 20 along with propranolol every 6 hourly.  CHA2DS2-VASc score of 0.  No need for anticoagulation at this time. Cardiology is on board-appreciate their recommendations. -Recommending to switch him to p.o. Cardizem along with long-acting propranolol from tomorrow, also started him on Eliquis.  They might attempt cardioversion in 4 weeks once heart rate is controlled and his thyroid levels normalizes. -Echocardiogram-normal -Continue to monitor.  Hyperthyroidism.  Has a new diagnosis of hyperthyroidism most likely Graves' disease with TSH below 0.010 and elevated all total and free T3 and T4.  Thyroid antibodies are positive for thyroid immunoglobulin, peroxidase and thyroglobulin, most  likely the cause of his A. fib with RVR and cardiac symptoms. -Thyroid ultrasound-with 4 nodules, 2 of them qualify for surveillance. -He was started on methimazole yesterday after talking with endocrinology, Dr. Gabriel Carina at [facility]. -Increase methimazole to 30 mg daily. -Patient needs to follow-up with endocrinology as an outpatient.  Dyspnea with lower extremity edema.  Appears volume up.  No prior history of cardiomyopathy.  Chest x-ray without any pulmonary edema.  Saturating well on room air.  BNP elevated at 200.  New onset heart failure?? -Cardiology is on board. -Obtain echocardiogram-Normal -Continue Lasix to 40 mg every 8 hourly. -Monitor intake and output. -Daily weight.  Left lower extremity cellulitis.  Hemodynamically stable, afebrile and no leukocytosis.  Calf appears very tight.  Left lower extremity with more edema associated with some erythema and hyperthermia and a small laceration. -Continue clindamycin p.o. per mild cellulitis protocol.  Patient has cephalosporin allergies. - lower extremity venous Doppler was negative for DVT.  History of MS. Stable, no acute exacerbation.  Follow-up with neurology. -Continue home dose of Tecfidera, baclofen and Neurontin.  Objective: Vitals:   10/02/19 1200 10/02/19 1334 10/02/19 1402 10/02/19 1500  BP: 122/79 (!) 143/80 134/80 116/64  Pulse: (!) 112 99 (!) 110 (!) 37  Resp: 19  (!) 25 (!) 26  Temp:   98 F (36.7 C)   TempSrc:      SpO2:   96% 90%  Weight:   122.6 kg   Height:   6\' 1"  (1.854 m)     Intake/Output Summary (Last 24 hours) at 10/02/2019 1531 Last data filed at 10/02/2019 1032 Gross per 24 hour  Intake 125 ml  Output 700 ml  Net -575 ml   Filed Weights   09/30/19 1452 10/02/19 0749 10/02/19 1402  Weight: 122.5 kg 123.9 kg 122.6 kg    Examination:  General exam: Appears calm and comfortable.  Mild diffuse thyroid enlargement. Respiratory system: Clear to auscultation. Respiratory effort  normal. Cardiovascular system: Irregularly irregular with tachycardia,. No JVD, murmurs, rubs, gallops or clicks. Gastrointestinal system: Soft, nontender, nondistended, bowel sounds positive. Central nervous system: Alert and oriented. No focal neurological deficits.Symmetric 5 x 5 power. Extremities: 2+ LE edema, left worse than right with some erythema and no hyper thermia today, erythema seems improving, no cyanosis, pulses intact and symmetrical. Psychiatry: Judgement and insight appear normal. Mood & affect appropriate.    DVT prophylaxis: Lovenox Code Status: Full Family Communication: Updated the patient. Disposition Plan: Pending improvement and heart rate control.  Patient with new diagnosis of hyperthyroidism, starting methimazole today.  Consultants:   Cardiology  Curbside endocrinology  Procedures:  Antimicrobials:  Clindamycin  Data Reviewed: I have personally reviewed following labs and imaging studies  CBC: Recent Labs  Lab 09/30/19 1504 09/30/19 1822 10/01/19 0358 10/02/19 0614  WBC 5.6 5.3 5.6 10.3  HGB 14.3 14.5 13.9 14.0  HCT 43.9 44.5 42.0 42.1  MCV 90.5 89.9 89.6 88.3  PLT 213 194 212 [id number]   Basic Metabolic Panel: Recent Labs  Lab 09/30/19 1504 09/30/19 1822 10/01/19 0358 10/02/19 0614  NA 141  --  142 135  K 4.5  --  3.7 4.3  CL 109  --  106 100  CO2 26  --  27 26  GLUCOSE 128*  --  126* 127*  BUN 8  --  9 13  CREATININE 0.93 0.84 0.84 0.84  CALCIUM 9.3  --  9.0 9.3  MG  --   --  2.0  --    GFR: Estimated Creatinine Clearance: 123.5 mL/min (by C-G formula based on SCr of 0.84 mg/dL). Liver Function Tests: Recent Labs  Lab 10/01/19 0927 10/02/19 0614  AST 24 20  ALT 31 26  ALKPHOS 51 55  BILITOT 1.2 1.7*  PROT 6.0* 6.4*  ALBUMIN 3.7 3.8   No results for input(s): LIPASE, AMYLASE in the last 168 hours. No results for input(s): AMMONIA in the last 168 hours. Coagulation Profile: No results for input(s): INR, PROTIME in the  last 168 hours. Cardiac Enzymes: No results for input(s): CKTOTAL, CKMB, CKMBINDEX, TROPONINI in the last 168 hours. BNP (last 3 results) No results for input(s): PROBNP in the last 8760 hours. HbA1C: Recent Labs    09/30/19 1822  HGBA1C 5.3   CBG: Recent Labs  Lab 10/02/19 1358  GLUCAP 147*   Lipid Profile: Recent Labs    10/01/19 0358  CHOL 108  HDL 35*  LDLCALC 57  TRIG 81  CHOLHDL 3.1   Thyroid Function Tests: Recent Labs    09/30/19 1822 10/01/19 0927  TSH <0.010*  --   T4TOTAL  --  13.9*  FREET4  --  3.29*  T3FREE  --  13.4*   Anemia Panel: No results for input(s): VITAMINB12, FOLATE, FERRITIN, TIBC, IRON, RETICCTPCT in the last 72 hours. Sepsis Labs: No results for input(s): PROCALCITON, LATICACIDVEN in the last 168 hours.  Recent Results (from the past 240 hour(s))  SARS CORONAVIRUS 2 (TAT 6-24 HRS) Nasopharyngeal Nasopharyngeal Swab     Status: None   Collection Time: 09/30/19  4:46 PM   Specimen: Nasopharyngeal Swab  Result Value Ref Range Status  SARS Coronavirus 2 NEGATIVE NEGATIVE Final    Comment: (NOTE) SARS-CoV-2 target nucleic acids are NOT DETECTED. The SARS-CoV-2 RNA is generally detectable in upper and lower respiratory specimens during the acute phase of infection. Negative results do not preclude SARS-CoV-2 infection, do not rule out co-infections with other pathogens, and should not be used as the sole basis for treatment or other patient management decisions. Negative results must be combined with clinical observations, patient history, and epidemiological information. The expected result is Negative. Fact Sheet for Patients: SugarRoll.be Fact Sheet for Healthcare Providers: https://www.woods-mathews.com/ This test is not yet approved or cleared by the [place] FDA and  has been authorized for detection and/or diagnosis of SARS-CoV-2 by FDA under an Emergency Use Authorization (EUA).  This EUA will remain  in effect (meaning this test can be used) for the duration of the COVID-19 declaration under Section 56 4(b)(1) of the Act, 21 U.S.C. section 360bbb-3(b)(1), unless the authorization is terminated or revoked sooner. Performed at [facility], [place] [address], [place], [place] [address]      Radiology Studies: US Venous Img Lower Bilateral (DVT)  Result Date: 10/01/2019 CLINICAL DATA:  Lower extremity edema EXAM: BILATERAL LOWER EXTREMITY VENOUS DOPPLER ULTRASOUND TECHNIQUE: Gray-scale sonography with graded compression, as well as color Doppler and duplex ultrasound were performed to evaluate the lower extremity deep venous systems from the level of the common femoral vein and including the common femoral, femoral, profunda femoral, popliteal and calf veins including the posterior tibial, peroneal and gastrocnemius veins when visible. The superficial great saphenous vein was also interrogated. Spectral Doppler was utilized to evaluate flow at rest and with distal augmentation maneuvers in the common femoral, femoral and popliteal veins. COMPARISON:  None. FINDINGS: RIGHT LOWER EXTREMITY Common Femoral Vein: No evidence of thrombus. Normal compressibility, respiratory phasicity and response to augmentation. Saphenofemoral Junction: No evidence of thrombus. Normal compressibility and flow on color Doppler imaging. Profunda Femoral Vein: No evidence of thrombus. Normal compressibility and flow on color Doppler imaging. Femoral Vein: No evidence of thrombus. Normal compressibility, respiratory phasicity and response to augmentation. Popliteal Vein: No evidence of thrombus. Normal compressibility, respiratory phasicity and response to augmentation. Calf Veins: No evidence of thrombus. Normal compressibility and flow on color Doppler imaging. Superficial Great Saphenous Vein: No evidence of thrombus. Normal compressibility. Other Findings:  None. LEFT LOWER EXTREMITY Common Femoral  Vein: No evidence of thrombus. Normal compressibility, respiratory phasicity and response to augmentation. Saphenofemoral Junction: No evidence of thrombus. Normal compressibility and flow on color Doppler imaging. Profunda Femoral Vein: No evidence of thrombus. Normal compressibility and flow on color Doppler imaging. Femoral Vein: No evidence of thrombus. Normal compressibility, respiratory phasicity and response to augmentation. Popliteal Vein: No evidence of thrombus. Normal compressibility, respiratory phasicity and response to augmentation. Calf Veins: No evidence of thrombus. Normal compressibility and flow on color Doppler imaging. Superficial Great Saphenous Vein: No evidence of thrombus. Normal compressibility. Other Findings:  None. IMPRESSION: No evidence of deep venous thrombosis in either lower extremity. Electronically Signed   By: Constance Holster M.D.   On: 10/01/2019 00:30   DG Chest Portable 1 View  Result Date: 09/30/2019 CLINICAL DATA:  Shortness of breath EXAM: PORTABLE CHEST 1 VIEW COMPARISON:  09/30/2019 FINDINGS: Minimal left base atelectasis. Right lung clear. Heart is normal size. No effusions or acute bony abnormality. IMPRESSION: Left base atelectasis.  No active disease. Electronically Signed   By: Rolm Baptise M.D.   On: 09/30/2019 15:54   ECHOCARDIOGRAM COMPLETE  Result Date: 10/01/2019    ECHOCARDIOGRAM REPORT   Patient Name:   Dustin Gray [facility] Date of Exam: 10/01/2019 Medical Rec #:  [id number]       Height:       74.0 in Accession #:    [id number]      Weight:       270.0 lb Date of Birth:  03-17-1956      BSA:          2.469 m Patient Age:    17 years        BP:           147/64 mmHg Patient Gender: M               HR:           114 bpm. Exam Location:  [facility] Procedure: 2D Echo, Color Doppler and Cardiac Doppler Indications:     R06.00 Dyspnea  History:         Patient has no prior history of Echocardiogram examinations.                  Arrythmias:Atrial Fibrillation;  Signs/Symptoms:Palpitations.  Sonographer:     Charmayne Sheer RDCS (AE) Referring Phys:  [place] Diagnosing Phys: Ida Rogue MD  Sonographer Comments: Suboptimal apical window. IMPRESSIONS  1. Left ventricular ejection fraction, by estimation, is 55 to 60%. The left ventricle has normal function. The left ventricle has no regional wall motion abnormalities. Left ventricular diastolic parameters are indeterminate.  2. Right ventricular systolic function is normal. The right ventricular size is normal. There is mildly elevated pulmonary artery systolic pressure. The estimated right ventricular systolic pressure is [id number] mmHg.  3. Rhythm is atrial fibrillation, rate >120 bpm FINDINGS  Left Ventricle: Left ventricular ejection fraction, by estimation, is 55 to 60%. The left ventricle has normal function. The left ventricle has no regional wall motion abnormalities. The left ventricular internal cavity size was normal in size. There is  no left ventricular hypertrophy. Left ventricular diastolic parameters are indeterminate. Right Ventricle: The right ventricular size is normal. No increase in right ventricular wall thickness. Right ventricular systolic function is normal. There is mildly elevated pulmonary artery systolic pressure. The tricuspid regurgitant velocity is 2.48  m/s, and with an assumed right atrial pressure of 10 mmHg, the estimated right ventricular systolic pressure is [id number] mmHg. Left Atrium: Left atrial size was normal in size. Right Atrium: Right atrial size was normal in size. Pericardium: There is no evidence of pericardial effusion. Mitral Valve: The mitral valve is normal in structure. Normal mobility of the mitral valve leaflets. No evidence of mitral valve regurgitation. No evidence of mitral valve stenosis. MV peak gradient, 9.7 mmHg. The mean mitral valve gradient is 3.0 mmHg. Tricuspid Valve: The tricuspid valve is normal in structure. Tricuspid valve regurgitation is mild . No  evidence of tricuspid stenosis. Aortic Valve: The aortic valve is normal in structure. Aortic valve regurgitation is not visualized. No aortic stenosis is present. Aortic valve mean gradient measures 8.0 mmHg. Aortic valve peak gradient measures 14.3 mmHg. Aortic valve area, by VTI measures 1.82 cm. Pulmonic Valve: The pulmonic valve was normal in structure. Pulmonic valve regurgitation is not visualized. No evidence of pulmonic stenosis. Aorta: The aortic root is normal in size and structure. There is mild dilatation of the aortic root measuring 40 mm. Venous: The inferior vena cava is normal in size with greater than 50% respiratory variability, suggesting right atrial pressure of 3  mmHg. IAS/Shunts: No atrial level shunt detected by color flow Doppler.  LEFT VENTRICLE PLAX 2D LVIDd:         4.24 cm  Diastology LVIDs:         3.25 cm  LV e' lateral:   8.05 cm/s LV PW:         1.25 cm  LV E/e' lateral: 16.9 LV IVS:        1.06 cm  LV e' medial:    8.70 cm/s LVOT diam:     2.20 cm  LV E/e' medial:  15.7 LV SV:         41 LV SV Index:   16 LVOT Area:     3.80 cm  LEFT ATRIUM             Index LA diam:        2.90 cm 1.17 cm/m LA Vol (A2C):   42.8 ml 17.33 ml/m LA Vol (A4C):   42.3 ml 17.13 ml/m LA Biplane Vol: 44.0 ml 17.82 ml/m  AORTIC VALVE                    PULMONIC VALVE AV Area (Vmax):    1.61 cm     PV Vmax:       1.47 m/s AV Area (Vmean):   1.63 cm     PV Vmean:      94.900 cm/s AV Area (VTI):     1.82 cm     PV VTI:        0.236 m AV Vmax:           189.00 cm/s  PV Peak grad:  8.6 mmHg AV Vmean:          130.000 cm/s PV Mean grad:  4.0 mmHg AV VTI:            0.224 m AV Peak Grad:      14.3 mmHg AV Mean Grad:      8.0 mmHg LVOT Vmax:         80.20 cm/s LVOT Vmean:        55.600 cm/s LVOT VTI:          0.107 m LVOT/AV VTI ratio: 0.48  AORTA Ao Root diam: 4.00 cm MITRAL VALVE                TRICUSPID VALVE MV Area (PHT): 3.84 cm     TR Peak grad:   24.6 mmHg MV Peak grad:  9.7 mmHg     TR Vmax:         248.00 cm/s MV Mean grad:  3.0 mmHg MV Vmax:       1.56 m/s     SHUNTS MV Vmean:      82.7 cm/s    Systemic VTI:  0.11 m MV Decel Time: 197 msec     Systemic Diam: 2.20 cm MV E velocity: 136.33 cm/s Ida Rogue MD Electronically signed by Ida Rogue MD Signature Date/Time: 10/01/2019/7:35:35 PM    Final (Updated)    US THYROID  Result Date: 10/01/2019 CLINICAL DATA:  Hyperthyroidism EXAM: THYROID ULTRASOUND TECHNIQUE: Ultrasound examination of the thyroid gland and adjacent soft tissues was performed. COMPARISON:  None. FINDINGS: Parenchymal Echotexture: Moderately heterogenous Isthmus: 2 mm Right lobe: 5.4 x 1.6 x 2.1 cm Left lobe: 5.6 x 1.8 x 1.8 cm _________________________________________________________ Estimated total number of nodules >/= 1 cm: 3 Number of spongiform nodules >/=  2 cm not described below (TR1): 0  Number of mixed cystic and solid nodules >/= 1.5 cm not described below ([place]): 0 _________________________________________________________ Nodule # 1: Location: Right; Inferior Maximum size: 1.0 cm; Other 2 dimensions: 0.9 x 0 point cm Composition: solid/almost completely solid (2) Echogenicity: hyperechoic (1) Shape: not taller-than-wide (0) Margins: ill-defined (0) Echogenic foci: macrocalcifications (1) ACR TI-RADS total points: 4. ACR TI-RADS risk category: TR4 (4-6 points). ACR TI-RADS recommendations: *Given size (>/= 1 - 1.4 cm) and appearance, a follow-up ultrasound in 1 year should be considered based on TI-RADS criteria. _________________________________________________________ Nodule # 2: Location: Left; Mid Maximum size: 1.0 cm; Other 2 dimensions: 0.6 x 0.5 cm Composition: solid/almost completely solid (2) Echogenicity: hypoechoic (2) Shape: not taller-than-wide (0) Margins: ill-defined (0) Echogenic foci: none (0) ACR TI-RADS total points: 4. ACR TI-RADS risk category: TR4 (4-6 points). ACR TI-RADS recommendations: *Given size (>/= 1 - 1.4 cm) and appearance, a follow-up  ultrasound in 1 year should be considered based on TI-RADS criteria. _________________________________________________________ Nodule # 3: Location: Left; Inferior Maximum size: 1.2 cm; Other 2 dimensions: 1.1 x 0.8 cm Composition: solid/almost completely solid (2) Echogenicity: isoechoic (1) Shape: not taller-than-wide (0) Margins: smooth (0) Echogenic foci: none (0) ACR TI-RADS total points: 3. ACR TI-RADS risk category: TR3 (3 points). ACR TI-RADS recommendations: Given size (<1.4 cm) and appearance, this nodule does NOT meet TI-RADS criteria for biopsy or dedicated follow-up. _________________________________________________________ Moderate thyroid heterogeneity without hypervascularity. No regional adenopathy. IMPRESSION: Bilateral TR 4 nodules (nodules 1 and 2) meet criteria follow-up in 1 year. 1.2 cm left inferior TR 3 nodule does not meet criteria for biopsy or follow-up. Nonspecific thyroid heterogeneity The above is in keeping with the ACR TI-RADS recommendations - J Am Coll Radiol 2017;14:587-595. Electronically Signed   By: Jerilynn Mages.  Shick M.D.   On: 10/01/2019 16:23    Scheduled Meds: . apixaban  5 mg Oral BID  . baclofen  10 mg Oral TID  . Chlorhexidine Gluconate Cloth  6 each Topical Daily  . clindamycin  300 mg Oral Q6H  . diltiazem  360 mg Oral Daily  . furosemide  40 mg Intravenous Q8H  . gabapentin  300 mg Oral q morning - 10a   And  . gabapentin  600 mg Oral QHS  . methimazole  20 mg Oral Daily  . potassium chloride  40 mEq Oral BID  . propranolol ER  80 mg Oral BID  . sodium chloride flush  3 mL Intravenous Q12H   Continuous Infusions:    LOS: 1 day   Time spent: 40 minutes.  Lorella Nimrod, MD [facility]  If 7PM-7AM, please contact night-coverage [email or web address]  10/02/2019, 3:31 PM   This record has been created using Systems analyst. Errors have been sought and corrected,but may not always be located. Such creation errors do not reflect on the  standard of care.

## 2019-10-03 DIAGNOSIS — R140 Abdominal distension (gaseous): Secondary | ICD-10-CM

## 2019-10-03 DIAGNOSIS — E0500 Thyrotoxicosis with diffuse goiter without thyrotoxic crisis or storm: Secondary | ICD-10-CM

## 2019-10-03 LAB — BASIC METABOLIC PANEL
Anion gap: 8 (ref 5–15)
BUN: 17 mg/dL (ref 8–23)
CO2: 27 mmol/L (ref 22–32)
Calcium: 8.9 mg/dL (ref 8.9–10.3)
Chloride: 100 mmol/L (ref 98–111)
Creatinine, Ser: 1.01 mg/dL (ref 0.61–1.24)
GFR calc Af Amer: >60 mL/min (ref >60)
GFR calc non Af Amer: >60 mL/min (ref >60)
Glucose, Bld: 109 mg/dL — ABNORMAL HIGH (ref 70–99)
Potassium: 4.1 mmol/L (ref 3.5–5.1)
Sodium: 135 mmol/L (ref 135–145)

## 2019-10-03 MED ORDER — DILTIAZEM HCL ER COATED BEADS 360 MG PO CP24: 360.0000 mg | ORAL_CAPSULE | Freq: Every day | ORAL | 0 refills | Status: DC

## 2019-10-03 MED ORDER — SIMETHICONE 80 MG PO CHEW
80.0000 mg | CHEWABLE_TABLET | Freq: Four times a day (QID) | ORAL | Status: DC | PRN
  Administered 2019-10-03: 80 mg via ORAL
  Filled 2019-10-03 (×2): qty 1

## 2019-10-03 MED ORDER — CLINDAMYCIN HCL 300 MG PO CAPS: 300.0000 mg | ORAL_CAPSULE | Freq: Three times a day (TID) | ORAL | 0 refills | Status: AC

## 2019-10-03 MED ORDER — METHIMAZOLE 10 MG PO TABS: 30.0000 mg | ORAL_TABLET | Freq: Every day | ORAL | 0 refills | Status: DC

## 2019-10-03 MED ORDER — APIXABAN 5 MG PO TABS: 5.0000 mg | ORAL_TABLET | Freq: Two times a day (BID) | ORAL | 0 refills | Status: DC

## 2019-10-03 MED ORDER — FUROSEMIDE 40 MG PO TABS: 40.0000 mg | ORAL_TABLET | Freq: Two times a day (BID) | ORAL | 11 refills | Status: DC

## 2019-10-03 MED ORDER — PROPRANOLOL HCL ER 80 MG PO CP24: 80.0000 mg | ORAL_CAPSULE | Freq: Two times a day (BID) | ORAL | 0 refills | Status: DC

## 2019-10-03 MED ORDER — POTASSIUM CHLORIDE ER 10 MEQ PO TBCR: 10.0000 meq | EXTENDED_RELEASE_TABLET | Freq: Every day | ORAL | 1 refills | Status: DC

## 2019-10-03 NOTE — Progress Notes (Signed)
Progress Note  Patient Name: Dustin Gray Date of Encounter: 10/03/2019  Primary Cardiologist: New, [facility] rounding  Subjective   No acute events overnight. Has some abdominal discomfort, relieved with burping.  Inpatient Medications    Scheduled Meds: . apixaban  5 mg Oral BID  . baclofen  10 mg Oral TID  . Chlorhexidine Gluconate Cloth  6 each Topical Daily  . clindamycin  300 mg Oral Q6H  . diltiazem  360 mg Oral Daily  . furosemide  40 mg Intravenous Q8H  . gabapentin  300 mg Oral q morning - 10a   And  . gabapentin  600 mg Oral QHS  . methimazole  30 mg Oral Daily  . potassium chloride  40 mEq Oral BID  . propranolol ER  80 mg Oral BID  . sodium chloride flush  3 mL Intravenous Q12H   Continuous Infusions:  PRN Meds: acetaminophen **OR** acetaminophen, ondansetron **OR** ondansetron (ZOFRAN) IV, polyethylene glycol, simethicone   Vital Signs    Vitals:   10/03/19 0820 10/03/19 0900 10/03/19 1000 10/03/19 1100  BP:    106/77  Pulse: 94 (!) 111 (!) 115 94  Resp: (!) 33 (!) 24 (!) 23 15  Temp: 98.5 F (36.9 C)     TempSrc: Oral     SpO2:  97% 95% 90%  Weight:      Height:        Intake/Output Summary (Last 24 hours) at 10/03/2019 1225 Last data filed at 10/03/2019 0924 Gross per 24 hour  Intake 240 ml  Output 2325 ml  Net -2085 ml   Last 3 Weights 10/02/2019 10/02/2019 09/30/2019  Weight (lbs) 270 lb 4.5 oz 273 lb 2.4 oz 270 lb  Weight (kg) 122.6 kg 123.9 kg 122.471 kg      Telemetry    afib - Personally Reviewed  ECG    No new tracing   Physical Exam   GEN: No acute distress.   Neck: No JVD Cardiac:  Irregular irregular, no murmurs, rubs, or gallops.  Respiratory: Clear to auscultation bilaterally. GI: Soft, nontender, non-distended  MS: trace edema; No deformity. Neuro:  Nonfocal  Psych: Normal affect   Labs    High Sensitivity Troponin:   Recent Labs  Lab 09/30/19 1504 09/30/19 1822  TROPONINIHS 7 8       Chemistry Recent Labs  Lab 10/01/19 0358 10/01/19 0927 10/02/19 0614 10/03/19 0353  NA 142  --  135 135  K 3.7  --  4.3 4.1  CL 106  --  100 100  CO2 27  --  26 27  GLUCOSE 126*  --  127* 109*  BUN 9  --  13 17  CREATININE 0.84  --  0.84 1.01  CALCIUM 9.0  --  9.3 8.9  PROT  --  6.0* 6.4*  --   ALBUMIN  --  3.7 3.8  --   AST  --  24 20  --   ALT  --  31 26  --   ALKPHOS  --  51 55  --   BILITOT  --  1.2 1.7*  --   GFRNONAA >60  --  >60 >60  GFRAA >60  --  >60 >60  ANIONGAP 9  --  9 8     Hematology Recent Labs  Lab 09/30/19 1822 10/01/19 0358 10/02/19 0614  WBC 5.3 5.6 10.3  RBC 4.95 4.69 4.77  HGB 14.5 13.9 14.0  HCT 44.5 42.0 42.1  MCV 89.9  89.6 88.3  MCH 29.3 29.6 29.4  MCHC 32.6 33.1 33.3  RDW 12.2 12.4 12.5  PLT 194 212 241    BNP Recent Labs  Lab 09/30/19 1504  BNP 200.0*     DDimer No results for input(s): DDIMER in the last 168 hours.   Radiology    US THYROID  Result Date: 10/01/2019 CLINICAL DATA:  Hyperthyroidism EXAM: THYROID ULTRASOUND TECHNIQUE: Ultrasound examination of the thyroid gland and adjacent soft tissues was performed. COMPARISON:  None. FINDINGS: Parenchymal Echotexture: Moderately heterogenous Isthmus: 2 mm Right lobe: 5.4 x 1.6 x 2.1 cm Left lobe: 5.6 x 1.8 x 1.8 cm _________________________________________________________ Estimated total number of nodules >/= 1 cm: 3 Number of spongiform nodules >/=  2 cm not described below (TR1): 0 Number of mixed cystic and solid nodules >/= 1.5 cm not described below ([place]): 0 _________________________________________________________ Nodule # 1: Location: Right; Inferior Maximum size: 1.0 cm; Other 2 dimensions: 0.9 x 0 point cm Composition: solid/almost completely solid (2) Echogenicity: hyperechoic (1) Shape: not taller-than-wide (0) Margins: ill-defined (0) Echogenic foci: macrocalcifications (1) ACR TI-RADS total points: 4. ACR TI-RADS risk category: TR4 (4-6 points). ACR TI-RADS  recommendations: *Given size (>/= 1 - 1.4 cm) and appearance, a follow-up ultrasound in 1 year should be considered based on TI-RADS criteria. _________________________________________________________ Nodule # 2: Location: Left; Mid Maximum size: 1.0 cm; Other 2 dimensions: 0.6 x 0.5 cm Composition: solid/almost completely solid (2) Echogenicity: hypoechoic (2) Shape: not taller-than-wide (0) Margins: ill-defined (0) Echogenic foci: none (0) ACR TI-RADS total points: 4. ACR TI-RADS risk category: TR4 (4-6 points). ACR TI-RADS recommendations: *Given size (>/= 1 - 1.4 cm) and appearance, a follow-up ultrasound in 1 year should be considered based on TI-RADS criteria. _________________________________________________________ Nodule # 3: Location: Left; Inferior Maximum size: 1.2 cm; Other 2 dimensions: 1.1 x 0.8 cm Composition: solid/almost completely solid (2) Echogenicity: isoechoic (1) Shape: not taller-than-wide (0) Margins: smooth (0) Echogenic foci: none (0) ACR TI-RADS total points: 3. ACR TI-RADS risk category: TR3 (3 points). ACR TI-RADS recommendations: Given size (<1.4 cm) and appearance, this nodule does NOT meet TI-RADS criteria for biopsy or dedicated follow-up. _________________________________________________________ Moderate thyroid heterogeneity without hypervascularity. No regional adenopathy. IMPRESSION: Bilateral TR 4 nodules (nodules 1 and 2) meet criteria follow-up in 1 year. 1.2 cm left inferior TR 3 nodule does not meet criteria for biopsy or follow-up. Nonspecific thyroid heterogeneity The above is in keeping with the ACR TI-RADS recommendations - J Am Coll Radiol 2017;14:587-595. Electronically Signed   By: Jerilynn Mages.  Shick M.D.   On: 10/01/2019 16:23    Cardiac Studies   Echo 09/2019 1. Left ventricular ejection fraction, by estimation, is 55 to 60%. The  left ventricle has normal function. The left ventricle has no regional  wall motion abnormalities. Left ventricular diastolic parameters  are  indeterminate.  2. Right ventricular systolic function is normal. The right ventricular  size is normal. There is mildly elevated pulmonary artery systolic  pressure. The estimated right ventricular systolic pressure is [id number] mmHg.  3. Rhythm is atrial fibrillation, rate >120 bpm   Patient Profile     64 y.o. male with history of multiple sclerosis, lymphedema presenting to the hospital due to worsening shortness of breath.  Found to have A. fib with RVR.  Later on diagnosed with hyperthyroidism.  Patient being seen for A. fib.  Assessment & Plan    1. Persistent afib -Currently in A. fib, heart rate controlled -Continue oral diltiazem 360 mg daily, propanolol ER 80  twice daily -Eliquis 5 mg twice daily -Patient can be discharged on current doses.  We will plan for outpatient follow-up and possible cardioversion after 4 weeks of uninterrupted anticoagulation. -Hopefully patient's symptoms will improve with treatment of hyperthyroidism  2.  Abdominal discomfort, burping -Simethicone ordered -She can take OTC simethicone as needed for abdominal distention/gas.  3.  History of MS -Restart patient's PTA meds  4.  Hyperthyroidism -Shikhman as per primary care.  On methimazole      Signed, [name], MD  10/03/2019, 12:25 PM

## 2019-10-03 NOTE — Discharge Summary (Signed)
Physician Discharge Summary  Dustin Gray [id number] DOB: 01/28/1956 DOA: 09/30/2019  PCP: [name], MD  Admit date: 09/30/2019 Discharge date: 10/03/2019  Admitted From: Home Disposition: Home  Recommendations for Outpatient Follow-up:  1. Follow up with PCP in 1-2 weeks 2. Follow-up with cardiology 3. Follow-up with endocrinology in 1 week 4. Please obtain BMP/CBC in one week 5. Please follow up on the following pending results:None  Home Health:No Equipment/Devices:None Discharge Condition: Stable CODE STATUS: Full Diet recommendation: Heart Healthy   Brief/Interim Summary: Dustin P [name] a 64 y.o.malewith medical history significant ofmultiple sclerosis,and back pain sent to ED from urgent care where he presented with complaints of 2 weeks of worsening dyspnea, generalized malaise and lower extremity edema. Found to have new onset A. fib with RVR. No chest pain.No orthopnea or PND. Patient denies any prior cardiac history. No recent upper respiratory symptoms.  COVID-19 negative.  Given diltiazem bolus and started on infusion.  Found to have undetectable TSH.  All of his thyroid labs include free and total T3 and T4 were elevated.  Thyroid antibodies were positive including thyroid immunoglobulin.  After discussing with endocrinology, diagnosis of Graves' disease was made and he was started on methimazole.  Thyroid ultrasound with 4 nodules, 2 of them needs further surveillance.  Patient was instructed to follow-up with endocrinology within 1 week for further management of his thyroid illness.  He was also seen by cardiologist due to difficult to control heart rate, initially managed with higher doses of Cardizem infusion, later he was converted to p.o. Cardizem 360 mg daily along with propranolol 80 mg twice daily.  His heart rate was in 90s on discharge and he will follow-up with cardiology as an outpatient.  He was also started on Eliquis.  He needs 4 weeks of  anticoagulation and if still remains in A. fib after controlling of his thyroid, he will need cardioversion which will be arranged by his cardiologist.  Patient came with 2+ lower extremity edema, left worse than right with erythema and hyperthermia, concerning for left lower extremity cellulitis.  Venous Doppler of lower extremities was negative for DVT and patient was given clindamycin for 7 days due to his allergies.  His symptoms resolved on the day of discharge and he will complete 3 more days of clindamycin at home.  He will continue rest of his home meds for MS.  Discharge Diagnoses:  Active Problems:   Atrial fibrillation with rapid ventricular response (HCC)   Atrial fibrillation with RVR (HCC)   Graves disease   Abdominal bloating  Discharge Instructions  Discharge Instructions    Ambulatory referral to Endocrinology   Complete by: As directed    For Dr. Gabriel Carina at [facility] endocrinology.   Diet - low sodium heart healthy   Complete by: As directed    Discharge instructions   Complete by: As directed    It was pleasure taking care of you. Please follow-up with your cardiologist for further management of your abnormal heart rhythm. Please follow-up with endocrinologist-you can call them on Monday to make sure that you will be seen next week. Continue taking your medications as directed   Increase activity slowly   Complete by: As directed      Allergies as of 10/03/2019      Reactions   Cephalexin Hives   Ancef [cefazolin Sodium] Rash   Cefadroxil Rash   Cefazolin Rash      Medication List    TAKE these medications   apixaban  5 MG Tabs tablet Commonly known as: ELIQUIS Take 1 tablet (5 mg total) by mouth 2 (two) times daily.   baclofen 10 MG tablet Commonly known as: LIORESAL Take 10 mg by mouth 3 (three) times daily.   clindamycin 300 MG capsule Commonly known as: CLEOCIN Take 1 capsule (300 mg total) by mouth 3 (three) times daily for 3 days.    diltiazem 360 MG 24 hr capsule Commonly known as: CARDIZEM CD Take 1 capsule (360 mg total) by mouth daily. Start taking on: October 04, 2019   furosemide 40 MG tablet Commonly known as: Lasix Take 1 tablet (40 mg total) by mouth 2 (two) times daily.   gabapentin 300 MG capsule Commonly known as: NEURONTIN Take 300 mg by mouth 3 (three) times daily. 300 mg Am and midday.  600 mg PM   meloxicam 15 MG tablet Commonly known as: MOBIC Take 15 mg by mouth daily.   methimazole 10 MG tablet Commonly known as: TAPAZOLE Take 3 tablets (30 mg total) by mouth daily. Start taking on: October 04, 2019   potassium chloride 10 MEQ tablet Commonly known as: KLOR-CON Take 1 tablet (10 mEq total) by mouth daily.   propranolol ER 80 MG 24 hr capsule Commonly known as: INDERAL LA Take 1 capsule (80 mg total) by mouth 2 (two) times daily.   Tecfidera 240 MG Cpdr Generic drug: Dimethyl Fumarate Take by mouth 2 (two) times daily.      Follow-up Information    [name], MD. Schedule an appointment as soon as possible for a visit.   Specialty: Family Medicine Contact information: [address], [place] [address] [phone]        Judi Cong, MD. Schedule an appointment as soon as possible for a visit.   Specialty: Endocrinology Contact information: [facility] [place] [address] [phone]          Allergies  Allergen Reactions  . Cephalexin Hives  . Ancef [Cefazolin Sodium] Rash  . Cefadroxil Rash  . Cefazolin Rash    Consultations:  Cardiology  Curbside endocrinology  Procedures/Studies: US Venous Img Lower Bilateral (DVT)  Result Date: 10/01/2019 CLINICAL DATA:  Lower extremity edema EXAM: BILATERAL LOWER EXTREMITY VENOUS DOPPLER ULTRASOUND TECHNIQUE: Gray-scale sonography with graded compression, as well as color Doppler and duplex ultrasound were performed to evaluate the lower extremity deep venous  systems from the level of the common femoral vein and including the common femoral, femoral, profunda femoral, popliteal and calf veins including the posterior tibial, peroneal and gastrocnemius veins when visible. The superficial great saphenous vein was also interrogated. Spectral Doppler was utilized to evaluate flow at rest and with distal augmentation maneuvers in the common femoral, femoral and popliteal veins. COMPARISON:  None. FINDINGS: RIGHT LOWER EXTREMITY Common Femoral Vein: No evidence of thrombus. Normal compressibility, respiratory phasicity and response to augmentation. Saphenofemoral Junction: No evidence of thrombus. Normal compressibility and flow on color Doppler imaging. Profunda Femoral Vein: No evidence of thrombus. Normal compressibility and flow on color Doppler imaging. Femoral Vein: No evidence of thrombus. Normal compressibility, respiratory phasicity and response to augmentation. Popliteal Vein: No evidence of thrombus. Normal compressibility, respiratory phasicity and response to augmentation. Calf Veins: No evidence of thrombus. Normal compressibility and flow on color Doppler imaging. Superficial Great Saphenous Vein: No evidence of thrombus. Normal compressibility. Other Findings:  None. LEFT LOWER EXTREMITY Common Femoral Vein: No evidence of thrombus. Normal compressibility, respiratory phasicity and response to augmentation.  Saphenofemoral Junction: No evidence of thrombus. Normal compressibility and flow on color Doppler imaging. Profunda Femoral Vein: No evidence of thrombus. Normal compressibility and flow on color Doppler imaging. Femoral Vein: No evidence of thrombus. Normal compressibility, respiratory phasicity and response to augmentation. Popliteal Vein: No evidence of thrombus. Normal compressibility, respiratory phasicity and response to augmentation. Calf Veins: No evidence of thrombus. Normal compressibility and flow on color Doppler imaging. Superficial Great  Saphenous Vein: No evidence of thrombus. Normal compressibility. Other Findings:  None. IMPRESSION: No evidence of deep venous thrombosis in either lower extremity. Electronically Signed   By: Constance Holster M.D.   On: 10/01/2019 00:30   DG Chest Portable 1 View  Result Date: 09/30/2019 CLINICAL DATA:  Shortness of breath EXAM: PORTABLE CHEST 1 VIEW COMPARISON:  09/30/2019 FINDINGS: Minimal left base atelectasis. Right lung clear. Heart is normal size. No effusions or acute bony abnormality. IMPRESSION: Left base atelectasis.  No active disease. Electronically Signed   By: Rolm Baptise M.D.   On: 09/30/2019 15:54   ECHOCARDIOGRAM COMPLETE  Result Date: 10/01/2019    ECHOCARDIOGRAM REPORT   Patient Name:   LOMAX Gray [facility] Date of Exam: 10/01/2019 Medical Rec #:  [id number]       Height:       74.0 in Accession #:    [id number]      Weight:       270.0 lb Date of Birth:  1955-12-17      BSA:          2.469 m Patient Age:    24 years        BP:           147/64 mmHg Patient Gender: M               HR:           114 bpm. Exam Location:  [facility] Procedure: 2D Echo, Color Doppler and Cardiac Doppler Indications:     R06.00 Dyspnea  History:         Patient has no prior history of Echocardiogram examinations.                  Arrythmias:Atrial Fibrillation; Signs/Symptoms:Palpitations.  Sonographer:     Charmayne Sheer RDCS (AE) Referring Phys:  [place] Diagnosing Phys: Ida Rogue MD  Sonographer Comments: Suboptimal apical window. IMPRESSIONS  1. Left ventricular ejection fraction, by estimation, is 55 to 60%. The left ventricle has normal function. The left ventricle has no regional wall motion abnormalities. Left ventricular diastolic parameters are indeterminate.  2. Right ventricular systolic function is normal. The right ventricular size is normal. There is mildly elevated pulmonary artery systolic pressure. The estimated right ventricular systolic pressure is Q000111Q mmHg.  3. Rhythm is atrial  fibrillation, rate >120 bpm FINDINGS  Left Ventricle: Left ventricular ejection fraction, by estimation, is 55 to 60%. The left ventricle has normal function. The left ventricle has no regional wall motion abnormalities. The left ventricular internal cavity size was normal in size. There is  no left ventricular hypertrophy. Left ventricular diastolic parameters are indeterminate. Right Ventricle: The right ventricular size is normal. No increase in right ventricular wall thickness. Right ventricular systolic function is normal. There is mildly elevated pulmonary artery systolic pressure. The tricuspid regurgitant velocity is 2.48  m/s, and with an assumed right atrial pressure of 10 mmHg, the estimated right ventricular systolic pressure is Q000111Q mmHg. Left Atrium: Left atrial size was normal in size. Right Atrium:  Right atrial size was normal in size. Pericardium: There is no evidence of pericardial effusion. Mitral Valve: The mitral valve is normal in structure. Normal mobility of the mitral valve leaflets. No evidence of mitral valve regurgitation. No evidence of mitral valve stenosis. MV peak gradient, 9.7 mmHg. The mean mitral valve gradient is 3.0 mmHg. Tricuspid Valve: The tricuspid valve is normal in structure. Tricuspid valve regurgitation is mild . No evidence of tricuspid stenosis. Aortic Valve: The aortic valve is normal in structure. Aortic valve regurgitation is not visualized. No aortic stenosis is present. Aortic valve mean gradient measures 8.0 mmHg. Aortic valve peak gradient measures 14.3 mmHg. Aortic valve area, by VTI measures 1.82 cm. Pulmonic Valve: The pulmonic valve was normal in structure. Pulmonic valve regurgitation is not visualized. No evidence of pulmonic stenosis. Aorta: The aortic root is normal in size and structure. There is mild dilatation of the aortic root measuring 40 mm. Venous: The inferior vena cava is normal in size with greater than 50% respiratory variability, suggesting  right atrial pressure of 3 mmHg. IAS/Shunts: No atrial level shunt detected by color flow Doppler.  LEFT VENTRICLE PLAX 2D LVIDd:         4.24 cm  Diastology LVIDs:         3.25 cm  LV e' lateral:   8.05 cm/s LV PW:         1.25 cm  LV E/e' lateral: 16.9 LV IVS:        1.06 cm  LV e' medial:    8.70 cm/s LVOT diam:     2.20 cm  LV E/e' medial:  15.7 LV SV:         41 LV SV Index:   16 LVOT Area:     3.80 cm  LEFT ATRIUM             Index LA diam:        2.90 cm 1.17 cm/m LA Vol (A2C):   42.8 ml 17.33 ml/m LA Vol (A4C):   42.3 ml 17.13 ml/m LA Biplane Vol: 44.0 ml 17.82 ml/m  AORTIC VALVE                    PULMONIC VALVE AV Area (Vmax):    1.61 cm     PV Vmax:       1.47 m/s AV Area (Vmean):   1.63 cm     PV Vmean:      94.900 cm/s AV Area (VTI):     1.82 cm     PV VTI:        0.236 m AV Vmax:           189.00 cm/s  PV Peak grad:  8.6 mmHg AV Vmean:          130.000 cm/s PV Mean grad:  4.0 mmHg AV VTI:            0.224 m AV Peak Grad:      14.3 mmHg AV Mean Grad:      8.0 mmHg LVOT Vmax:         80.20 cm/s LVOT Vmean:        55.600 cm/s LVOT VTI:          0.107 m LVOT/AV VTI ratio: 0.48  AORTA Ao Root diam: 4.00 cm MITRAL VALVE                TRICUSPID VALVE MV Area (PHT): 3.84 cm     TR  Peak grad:   24.6 mmHg MV Peak grad:  9.7 mmHg     TR Vmax:        248.00 cm/s MV Mean grad:  3.0 mmHg MV Vmax:       1.56 m/s     SHUNTS MV Vmean:      82.7 cm/s    Systemic VTI:  0.11 m MV Decel Time: 197 msec     Systemic Diam: 2.20 cm MV E velocity: 136.33 cm/s Ida Rogue MD Electronically signed by Ida Rogue MD Signature Date/Time: 10/01/2019/7:35:35 PM    Final (Updated)    US THYROID  Result Date: 10/01/2019 CLINICAL DATA:  Hyperthyroidism EXAM: THYROID ULTRASOUND TECHNIQUE: Ultrasound examination of the thyroid gland and adjacent soft tissues was performed. COMPARISON:  None. FINDINGS: Parenchymal Echotexture: Moderately heterogenous Isthmus: 2 mm Right lobe: 5.4 x 1.6 x 2.1 cm Left lobe: 5.6 x 1.8 x 1.8  cm _________________________________________________________ Estimated total number of nodules >/= 1 cm: 3 Number of spongiform nodules >/=  2 cm not described below (TR1): 0 Number of mixed cystic and solid nodules >/= 1.5 cm not described below ([place]): 0 _________________________________________________________ Nodule # 1: Location: Right; Inferior Maximum size: 1.0 cm; Other 2 dimensions: 0.9 x 0 point cm Composition: solid/almost completely solid (2) Echogenicity: hyperechoic (1) Shape: not taller-than-wide (0) Margins: ill-defined (0) Echogenic foci: macrocalcifications (1) ACR TI-RADS total points: 4. ACR TI-RADS risk category: TR4 (4-6 points). ACR TI-RADS recommendations: *Given size (>/= 1 - 1.4 cm) and appearance, a follow-up ultrasound in 1 year should be considered based on TI-RADS criteria. _________________________________________________________ Nodule # 2: Location: Left; Mid Maximum size: 1.0 cm; Other 2 dimensions: 0.6 x 0.5 cm Composition: solid/almost completely solid (2) Echogenicity: hypoechoic (2) Shape: not taller-than-wide (0) Margins: ill-defined (0) Echogenic foci: none (0) ACR TI-RADS total points: 4. ACR TI-RADS risk category: TR4 (4-6 points). ACR TI-RADS recommendations: *Given size (>/= 1 - 1.4 cm) and appearance, a follow-up ultrasound in 1 year should be considered based on TI-RADS criteria. _________________________________________________________ Nodule # 3: Location: Left; Inferior Maximum size: 1.2 cm; Other 2 dimensions: 1.1 x 0.8 cm Composition: solid/almost completely solid (2) Echogenicity: isoechoic (1) Shape: not taller-than-wide (0) Margins: smooth (0) Echogenic foci: none (0) ACR TI-RADS total points: 3. ACR TI-RADS risk category: TR3 (3 points). ACR TI-RADS recommendations: Given size (<1.4 cm) and appearance, this nodule does NOT meet TI-RADS criteria for biopsy or dedicated follow-up. _________________________________________________________ Moderate thyroid  heterogeneity without hypervascularity. No regional adenopathy. IMPRESSION: Bilateral TR 4 nodules (nodules 1 and 2) meet criteria follow-up in 1 year. 1.2 cm left inferior TR 3 nodule does not meet criteria for biopsy or follow-up. Nonspecific thyroid heterogeneity The above is in keeping with the ACR TI-RADS recommendations - J Am Coll Radiol 2017;14:587-595. Electronically Signed   By: Jerilynn Mages.  Shick M.D.   On: 10/01/2019 16:23     Subjective: Patient is feeling better when seen today.  No new complaints.  Discharge Exam: Vitals:   10/03/19 1000 10/03/19 1100  BP:  106/77  Pulse: (!) 115 94  Resp: (!) 23 15  Temp:    SpO2: 95% 90%   Vitals:   10/03/19 0820 10/03/19 0900 10/03/19 1000 10/03/19 1100  BP:    106/77  Pulse: 94 (!) 111 (!) 115 94  Resp: (!) 33 (!) 24 (!) 23 15  Temp: 98.5 F (36.9 C)     TempSrc: Oral     SpO2:  97% 95% 90%  Weight:  Height:        General: Pt is alert, awake, not in acute distress Cardiovascular: Irregularly irregular, no rubs, no gallops Respiratory: CTA bilaterally, no wheezing, no rhonchi Abdominal: Soft, NT, ND, bowel sounds + Extremities: 1+ edema, no cyanosis   The results of significant diagnostics from this hospitalization (including imaging, microbiology, ancillary and laboratory) are listed below for reference.    Microbiology: Recent Results (from the past 240 hour(s))  SARS CORONAVIRUS 2 (TAT 6-24 HRS) Nasopharyngeal Nasopharyngeal Swab     Status: None   Collection Time: 09/30/19  4:46 PM   Specimen: Nasopharyngeal Swab  Result Value Ref Range Status   SARS Coronavirus 2 NEGATIVE NEGATIVE Final    Comment: (NOTE) SARS-CoV-2 target nucleic acids are NOT DETECTED. The SARS-CoV-2 RNA is generally detectable in upper and lower respiratory specimens during the acute phase of infection. Negative results do not preclude SARS-CoV-2 infection, do not rule out co-infections with other pathogens, and should not be used as the sole  basis for treatment or other patient management decisions. Negative results must be combined with clinical observations, patient history, and epidemiological information. The expected result is Negative. Fact Sheet for Patients: SugarRoll.be Fact Sheet for Healthcare Providers: https://www.woods-mathews.com/ This test is not yet approved or cleared by the [place] FDA and  has been authorized for detection and/or diagnosis of SARS-CoV-2 by FDA under an Emergency Use Authorization (EUA). This EUA will remain  in effect (meaning this test can be used) for the duration of the COVID-19 declaration under Section 56 4(b)(1) of the Act, 21 U.S.C. section 360bbb-3(b)(1), unless the authorization is terminated or revoked sooner. Performed at [facility], [place] [address], [place], [place] [address]   MRSA PCR Screening     Status: None   Collection Time: 10/02/19  2:11 PM   Specimen: Nasal Mucosa; Nasopharyngeal  Result Value Ref Range Status   MRSA by PCR NEGATIVE NEGATIVE Final    Comment:        The GeneXpert MRSA Assay (FDA approved for NASAL specimens only), is one component of a comprehensive MRSA colonization surveillance program. It is not intended to diagnose MRSA infection nor to guide or monitor treatment for MRSA infections. Performed at [facility], [address] [place], [place] [address]      Labs: BNP (last 3 results) Recent Labs    09/30/19 1504  BNP [id number]*   Basic Metabolic Panel: Recent Labs  Lab 09/30/19 1504 09/30/19 1822 10/01/19 0358 10/02/19 0614 10/03/19 0353  NA 141  --  142 135 135  K 4.5  --  3.7 4.3 4.1  CL 109  --  106 100 100  CO2 26  --  27 26 27   GLUCOSE 128*  --  126* 127* 109*  BUN 8  --  9 13 17   CREATININE 0.93 0.84 0.84 0.84 1.01  CALCIUM 9.3  --  9.0 9.3 8.9  MG  --   --  2.0  --   --    Liver Function Tests: Recent Labs  Lab 10/01/19 0927 10/02/19 0614  AST  24 20  ALT 31 26  ALKPHOS 51 55  BILITOT 1.2 1.7*  PROT 6.0* 6.4*  ALBUMIN 3.7 3.8   No results for input(s): LIPASE, AMYLASE in the last 168 hours. No results for input(s): AMMONIA in the last 168 hours. CBC: Recent Labs  Lab 09/30/19 1504 09/30/19 1822 10/01/19 0358 10/02/19 0614  WBC 5.6 5.3 5.6 10.3  HGB 14.3 14.5  13.9 14.0  HCT 43.9 44.5 42.0 42.1  MCV 90.5 89.9 89.6 88.3  PLT 213 194 212 241   Cardiac Enzymes: No results for input(s): CKTOTAL, CKMB, CKMBINDEX, TROPONINI in the last 168 hours. BNP: Invalid input(s): POCBNP CBG: Recent Labs  Lab 10/02/19 1358  GLUCAP 147*   D-Dimer No results for input(s): DDIMER in the last 72 hours. Hgb A1c Recent Labs    09/30/19 1822  HGBA1C 5.3   Lipid Profile Recent Labs    10/01/19 0358  CHOL 108  HDL 35*  LDLCALC 57  TRIG 81  CHOLHDL 3.1   Thyroid function studies Recent Labs    09/30/19 1822 10/01/19 0927  TSH <0.010*  --   T4TOTAL  --  13.9*  T3FREE  --  13.4*   Anemia work up No results for input(s): VITAMINB12, FOLATE, FERRITIN, TIBC, IRON, RETICCTPCT in the last 72 hours. Urinalysis    Component Value Date/Time   COLORURINE YELLOW (A) 09/30/2019 2047   APPEARANCEUR CLEAR (A) 09/30/2019 2047   LABSPEC 1.018 09/30/2019 2047   PHURINE 6.0 09/30/2019 2047   GLUCOSEU NEGATIVE 09/30/2019 2047   HGBUR NEGATIVE 09/30/2019 2047   BILIRUBINUR NEGATIVE 09/30/2019 2047   KETONESUR 5 (A) 09/30/2019 2047   PROTEINUR NEGATIVE 09/30/2019 2047   NITRITE NEGATIVE 09/30/2019 2047   LEUKOCYTESUR NEGATIVE 09/30/2019 2047   Sepsis Labs Invalid input(s): PROCALCITONIN,  WBC,  LACTICIDVEN Microbiology Recent Results (from the past 240 hour(s))  SARS CORONAVIRUS 2 (TAT 6-24 HRS) Nasopharyngeal Nasopharyngeal Swab     Status: None   Collection Time: 09/30/19  4:46 PM   Specimen: Nasopharyngeal Swab  Result Value Ref Range Status   SARS Coronavirus 2 NEGATIVE NEGATIVE Final    Comment: (NOTE) SARS-CoV-2  target nucleic acids are NOT DETECTED. The SARS-CoV-2 RNA is generally detectable in upper and lower respiratory specimens during the acute phase of infection. Negative results do not preclude SARS-CoV-2 infection, do not rule out co-infections with other pathogens, and should not be used as the sole basis for treatment or other patient management decisions. Negative results must be combined with clinical observations, patient history, and epidemiological information. The expected result is Negative. Fact Sheet for Patients: SugarRoll.be Fact Sheet for Healthcare Providers: https://www.woods-mathews.com/ This test is not yet approved or cleared by the [place] FDA and  has been authorized for detection and/or diagnosis of SARS-CoV-2 by FDA under an Emergency Use Authorization (EUA). This EUA will remain  in effect (meaning this test can be used) for the duration of the COVID-19 declaration under Section 56 4(b)(1) of the Act, 21 U.S.C. section 360bbb-3(b)(1), unless the authorization is terminated or revoked sooner. Performed at [facility], [place] [address] [place], [place] [address]   MRSA PCR Screening     Status: None   Collection Time: 10/02/19  2:11 PM   Specimen: Nasal Mucosa; Nasopharyngeal  Result Value Ref Range Status   MRSA by PCR NEGATIVE NEGATIVE Final    Comment:        The GeneXpert MRSA Assay (FDA approved for NASAL specimens only), is one component of a comprehensive MRSA colonization surveillance program. It is not intended to diagnose MRSA infection nor to guide or monitor treatment for MRSA infections. Performed at [facility], [address] [place], [place] [address]     Time coordinating discharge: Over 30 minutes  SIGNED:  Lorella Nimrod, MD  [facility] 10/03/2019, 12:35 PM  If 7PM-7AM, please contact night-coverage [email or web address]  This record has been  created using Actor. Errors have been sought and corrected,but may not always be located. Such creation errors do not reflect on the standard of care.

## 2019-10-03 NOTE — Progress Notes (Signed)
Patient discharged home.

## 2019-10-05 ENCOUNTER — Other Ambulatory Visit: Payer: Medicare PPO

## 2019-10-06 ENCOUNTER — Other Ambulatory Visit: Payer: Medicare PPO

## 2019-10-09 ENCOUNTER — Ambulatory Visit (INDEPENDENT_AMBULATORY_CARE_PROVIDER_SITE_OTHER): Payer: Medicare PPO | Admitting: Cardiology

## 2019-10-09 ENCOUNTER — Ambulatory Visit: Payer: Medicare PPO | Attending: Internal Medicine

## 2019-10-09 ENCOUNTER — Encounter: Payer: Self-pay | Admitting: Cardiology

## 2019-10-09 ENCOUNTER — Other Ambulatory Visit: Payer: Self-pay

## 2019-10-09 VITALS — BP 120/70 | HR 129 | Ht 74.0 in | Wt 268.4 lb

## 2019-10-09 DIAGNOSIS — E0590 Thyrotoxicosis, unspecified without thyrotoxic crisis or storm: Secondary | ICD-10-CM

## 2019-10-09 DIAGNOSIS — Z23 Encounter for immunization: Secondary | ICD-10-CM

## 2019-10-09 DIAGNOSIS — I4819 Other persistent atrial fibrillation: Secondary | ICD-10-CM

## 2019-10-09 MED ORDER — DIGOXIN 125 MCG PO TABS: 0.1250 mg | ORAL_TABLET | Freq: Every day | ORAL | 6 refills | Status: DC

## 2019-10-09 NOTE — Progress Notes (Signed)
   Covid-19 Vaccination Clinic  Name:  Dustin Gray    MRN: [id number] DOB: 1955-12-31  10/09/2019  [name] was observed post Covid-19 immunization for 15 minutes without incident. He was provided with Vaccine Information Sheet and instruction to access the V-Safe system.   [name] was instructed to call 911 with any severe reactions post vaccine: Marland Kitchen Difficulty breathing  . Swelling of face and throat  . A fast heartbeat  . A bad rash all over body  . Dizziness and weakness   Immunizations Administered    Name Date Dose VIS Date Route   Pfizer COVID-19 Vaccine 10/09/2019  5:04 PM 0.3 mL 06/05/2019 Intramuscular   Manufacturer: [employer]   Lot: [id number]   [place]: [id number]

## 2019-10-09 NOTE — Progress Notes (Signed)
Cardiology Office Note:    Date:  10/09/2019   ID:  Dustin Gray, DOB Jul 17, 1955, MRN [id number]  PCP:  [name], MD  Cardiologist:  [name], MD  Electrophysiologist:  None   Referring MD: [name], MD   Chief Complaint  Patient presents with  . [facility] f/u afib c/o sob and edema ft/legs. Meds reviewed verbally with pt.    History of Present Illness:    Dustin Gray is a 64 y.o. male with a hx of multiple sclerosis, lymphedema, hypothyroidism atrial fibrillation who presents for follow-up.  Patient recently seen in the hospital for atrial fibrillation with rapid ventricular response.  He was managed with diltiazem and Eliquis.  He was subsequently diagnosed with hyperthyroidism and propanolol ER added to his medical regimen.  Echocardiogram on 09/2019 showed normal ejection fraction with EF 55 to 60%, left atrial size was also normal.  He was started on anticoagulation due to plan for cardioversion after uninterrupted anticoagulation if he stays in A. fib.  Past Medical History:  Diagnosis Date  . Abscess    groin  . Arthritis    lower spine  . Erectile dysfunction   . Low testosterone   . Lumbar herniated disc    L5  . Multiple sclerosis ([place])   . Staph aureus infection     Past Surgical History:  Procedure Laterality Date  . COLONOSCOPY WITH PROPOFOL N/A 07/04/2016   Procedure: COLONOSCOPY WITH PROPOFOL;  Surgeon: [name], MD;  Location: [facility];  Service: Endoscopy;  Laterality: N/A;  . [place]  . MASS EXCISION  1960  . POLYPECTOMY  07/04/2016   Procedure: POLYPECTOMY;  Surgeon: [name], MD;  Location: [facility];  Service: Endoscopy;;  . TONSILLECTOMY      Current Medications: Current Meds  Medication Sig  . apixaban (ELIQUIS) 5 MG TABS tablet Take 1 tablet (5 mg total) by mouth 2 (two) times daily.  . baclofen (LIORESAL) 10 MG tablet Take 10 mg by mouth 3 (three) times daily.    Marland Kitchen  diltiazem (CARDIZEM CD) 360 MG 24 hr capsule Take 1 capsule (360 mg total) by mouth daily.  . Dimethyl Fumarate (TECFIDERA) 240 MG CPDR Take by mouth 2 (two) times daily.  . furosemide (LASIX) 40 MG tablet Take 1 tablet (40 mg total) by mouth 2 (two) times daily.  Marland Kitchen gabapentin (NEURONTIN) 300 MG capsule Take 300 mg by mouth 3 (three) times daily. 300 mg Am and midday.  600 mg PM  . meloxicam (MOBIC) 15 MG tablet Take 15 mg by mouth daily as needed.   . methimazole (TAPAZOLE) 10 MG tablet Take 3 tablets (30 mg total) by mouth daily.  . potassium chloride (KLOR-CON) 10 MEQ tablet Take 1 tablet (10 mEq total) by mouth daily.  . propranolol ER (INDERAL LA) 80 MG 24 hr capsule Take 1 capsule (80 mg total) by mouth 2 (two) times daily.     Allergies:   Cephalexin, Ancef [cefazolin sodium], Cefadroxil, and Cefazolin   Social History   Socioeconomic History  . Marital status: Legally Separated    Spouse name: Not on file  . Number of children: Not on file  . Years of education: Not on file  . Highest education level: Not on file  Occupational History  . Not on file  Tobacco Use  . Smoking status: Former Research scientist (life sciences)  . Smokeless tobacco: Never Used  . Tobacco comment: smoked as teenager  Substance  and Sexual Activity  . Alcohol use: Yes    Comment: 2 drinks/month  . Drug use: No  . Sexual activity: Not on file  Other Topics Concern  . Not on file  Social History Narrative  . Not on file   Social Determinants of Health   Financial Resource Strain:   . Difficulty of Paying Living Expenses:   Food Insecurity:   . Worried About Charity fundraiser in the Last Year:   . Arboriculturist in the Last Year:   Transportation Needs:   . Film/video editor (Medical):   Marland Kitchen Lack of Transportation (Non-Medical):   Physical Activity:   . Days of Exercise per Week:   . Minutes of Exercise per Session:   Stress:   . Feeling of Stress :   Social Connections:   . Frequency of Communication with  Friends and Family:   . Frequency of Social Gatherings with Friends and Family:   . Attends Religious Services:   . Active Member of Clubs or Organizations:   . Attends Archivist Meetings:   Marland Kitchen Marital Status:      Family History: The patient's family history includes Diabetes in his father.  ROS:   Please see the history of present illness.     All other systems reviewed and are negative.  EKGs/Labs/Other Studies Reviewed:    The following studies were reviewed today:   EKG:  EKG is  ordered today.  The ekg ordered today demonstrates clear fibrillation, RVR, heart rate 129  Recent Labs: 09/30/2019: B Natriuretic Peptide 200.0; TSH <0.010 10/01/2019: Magnesium 2.0 10/02/2019: ALT 26; Hemoglobin 14.0; Platelets 241 10/03/2019: BUN 17; Creatinine, Ser 1.01; Potassium 4.1; Sodium 135  Recent Lipid Panel    Component Value Date/Time   CHOL 108 10/01/2019 0358   TRIG 81 10/01/2019 0358   HDL 35 (L) 10/01/2019 0358   CHOLHDL 3.1 10/01/2019 0358   VLDL 16 10/01/2019 0358   LDLCALC 57 10/01/2019 0358    Physical Exam:    VS:  BP 120/70 (BP Location: Right Arm, Patient Position: Sitting, Cuff Size: Normal)   Pulse (!) 129   Ht 6\' 2"  (1.88 m)   Wt 268 lb 6 oz (121.7 kg)   SpO2 96%   BMI 34.46 kg/m     Wt Readings from Last 3 Encounters:  10/09/19 268 lb 6 oz (121.7 kg)  10/02/19 270 lb 4.5 oz (122.6 kg)  07/04/16 279 lb (126.6 kg)     GEN:  Well nourished, well developed in no acute distress HEENT: Normal NECK: No JVD; No carotid bruits LYMPHATICS: No lymphadenopathy CARDIAC: Irregular irregular, no murmurs, rubs, gallops RESPIRATORY:  Clear to auscultation without rales, wheezing or rhonchi  ABDOMEN: Soft, non-tender, non-distended MUSCULOSKELETAL:  trace edema; No deformity  SKIN: Warm and dry NEUROLOGIC:  Alert and oriented x 3 PSYCHIATRIC:  Normal affect   ASSESSMENT:    1. Persistent atrial fibrillation ([place])   2. Hyperthyroidism    PLAN:    In  order of problems listed above:  1. Patient with history of persistent atrial fibrillation.  Heart rate still elevated on diltiazem 360, Inderal 80 mg twice daily.  We will add digoxin 0.125 mg daily.  Continue Eliquis 5 mg twice daily.  Patient has been on anticoagulation uninterrupted for 1 week now.  We will schedule a cardioversion in 3 weeks from now.  Hypothyroidism is contributing to A. fib with tachycardia.  Hopefully, rates improved as his thyroid function  becomes better. 2. History of hypothyroidism.  On methimazole.  Endocrinology following.  Appreciate recommendations.  This note was generated in part or whole with voice recognition software. Voice recognition is usually quite accurate but there are transcription errors that can and very often do occur. I apologize for any typographical errors that were not detected and corrected.  Medication Adjustments/Labs and Tests Ordered: Current medicines are reviewed at length with the patient today.  Concerns regarding medicines are outlined above.  Orders Placed This Encounter  Procedures  . EKG 12-Lead   Meds ordered this encounter  Medications  . digoxin (LANOXIN) 0.125 MG tablet    Sig: Take 1 tablet (0.125 mg total) by mouth daily.    Dispense:  30 tablet    Refill:  6    Patient Instructions  Medication Instructions:  - Your physician has recommended you make the following change in your medication:   1) Start digoxin 0.125 mg- take 1 tablet by mouth once daily   *If you need a refill on your cardiac medications before your next appointment, please call your pharmacy*   Lab Work:  Pre procedure COVID swab:  Monday 10/26/19 (8am - 1pm ) - [facility], drive up testing only  If you have labs (blood work) drawn today and your tests are completely normal, you will receive your results only by: Marland Kitchen MyChart Message (if you have MyChart) OR . A paper copy in the mail If you have any lab test that is abnormal or  we need to change your treatment, we will call you to review the results.   Testing/Procedures: - Your physician has recommended that you have a Cardioversion (DCCV). Electrical Cardioversion uses a jolt of electricity to your heart either through paddles or wired patches attached to your chest. This is a controlled, usually prescheduled, procedure. Defibrillation is done under light anesthesia in the hospital, and you usually go home the day of the procedure. This is done to get your heart back into a normal rhythm. You are not awake for the procedure.   You are scheduled for a Cardioversion on Wednesday 10/28/19 with Dr. [name]  Please arrive at the [place] [facility] at 6:30 am on the day of your procedure.  DIET INSTRUCTIONS:  Nothing to eat or drink after midnight the night prior to your procedure         1) Labs: as above  2) Medications:  You may take all of your regular medications the morning of your procedure unless listed below:   - HOLD lasix (furosemide) the morning of your procedure  3) Must have a responsible person to drive you home.  4) Bring a current list of your medications and current insurance cards.    If you have any questions after you get home, please call the office at [phone]   Follow-Up: At [facility], you and your health needs are our priority.  As part of our continuing mission to provide you with exceptional heart care, we have created designated Provider Care Teams.  These Care Teams include your primary Cardiologist (physician) and Advanced Practice Providers (APPs -  Physician Assistants and Nurse Practitioners) who all work together to provide you with the care you need, when you need it.  We recommend signing up for the patient portal called "MyChart".  Sign up information is provided on this After Visit Summary.  MyChart is used to connect with patients for Virtual Visits (Telemedicine).  Patients are able  to view lab/test results,  encounter notes, upcoming appointments, etc.  Non-urgent messages can be sent to your provider as well.   To learn more about what you can do with MyChart, go to [email or web address].    Your next appointment:   2 month(s)  The format for your next appointment:   In Person  Provider:   [name], MD   Other Instructions N/a   Electrical Cardioversion Electrical cardioversion is the delivery of a jolt of electricity to restore a normal rhythm to the heart. A rhythm that is too fast or is not regular keeps the heart from pumping well. In this procedure, sticky patches or metal paddles are placed on the chest to deliver electricity to the heart from a device. This procedure may be done in an emergency if:  There is low or no blood pressure as a result of the heart rhythm.  Normal rhythm must be restored as fast as possible to protect the brain and heart from further damage.  It may save a life. This may also be a scheduled procedure for irregular or fast heart rhythms that are not immediately life-threatening. Tell a health care provider about:  Any allergies you have.  All medicines you are taking, including vitamins, herbs, eye drops, creams, and over-the-counter medicines.  Any problems you or family members have had with anesthetic medicines.  Any blood disorders you have.  Any surgeries you have had.  Any medical conditions you have.  Whether you are pregnant or may be pregnant. What are the risks? Generally, this is a safe procedure. However, problems may occur, including:  Allergic reactions to medicines.  A blood clot that breaks free and travels to other parts of your body.  The possible return of an abnormal heart rhythm within hours or days after the procedure.  Your heart stopping (cardiac arrest). This is rare. What happens before the procedure? Medicines  Your health care provider may have you start taking: ? Blood-thinning medicines  (anticoagulants) so your blood does not clot as easily. ? Medicines to help stabilize your heart rate and rhythm.  Ask your health care provider about: ? Changing or stopping your regular medicines. This is especially important if you are taking diabetes medicines or blood thinners. ? Taking medicines such as aspirin and ibuprofen. These medicines can thin your blood. Do not take these medicines unless your health care provider tells you to take them. ? Taking over-the-counter medicines, vitamins, herbs, and supplements. General instructions  Follow instructions from your health care provider about eating or drinking restrictions.  Plan to have someone take you home from the hospital or clinic.  If you will be going home right after the procedure, plan to have someone with you for 24 hours.  Ask your health care provider what steps will be taken to help prevent infection. These may include washing your skin with a germ-killing soap. What happens during the procedure?   An IV will be inserted into one of your veins.  Sticky patches (electrodes) or metal paddles may be placed on your chest.  You will be given a medicine to help you relax (sedative).  An electrical shock will be delivered. The procedure may vary among health care providers and hospitals. What can I expect after the procedure?  Your blood pressure, heart rate, breathing rate, and blood oxygen level will be monitored until you leave the hospital or clinic.  Your heart rhythm will be watched to make sure it does not change.  You may have some redness on the skin where the shocks were given. Follow these instructions at home:  Do not drive for 24 hours if you were given a sedative during your procedure.  Take over-the-counter and prescription medicines only as told by your health care provider.  Ask your health care provider how to check your pulse. Check it often.  Rest for 48 hours after the procedure or as told  by your health care provider.  Avoid or limit your caffeine use as told by your health care provider.  Keep all follow-up visits as told by your health care provider. This is important. Contact a health care provider if:  You feel like your heart is beating too quickly or your pulse is not regular.  You have a serious muscle cramp that does not go away. Get help right away if:  You have discomfort in your chest.  You are dizzy or you feel faint.  You have trouble breathing or you are short of breath.  Your speech is slurred.  You have trouble moving an arm or leg on one side of your body.  Your fingers or toes turn cold or blue. Summary  Electrical cardioversion is the delivery of a jolt of electricity to restore a normal rhythm to the heart.  This procedure may be done right away in an emergency or may be a scheduled procedure if the condition is not an emergency.  Generally, this is a safe procedure.  After the procedure, check your pulse often as told by your health care provider. This information is not intended to replace advice given to you by your health care provider. Make sure you discuss any questions you have with your health care provider. Document Revised: 01/12/2019 Document Reviewed: 01/12/2019 Elsevier Patient Education  2020 Wallace, [name], MD  10/09/2019 12:02 PM    [place]

## 2019-10-09 NOTE — Patient Instructions (Addendum)
Medication Instructions:  - Your physician has recommended you make the following change in your medication:   1) Start digoxin 0.125 mg- take 1 tablet by mouth once daily   *If you need a refill on your cardiac medications before your next appointment, please call your pharmacy*   Lab Work:  Pre procedure COVID swab:  Monday 10/26/19 (8am - 1pm ) - [facility] entrance at [facility], drive up testing only  If you have labs (blood work) drawn today and your tests are completely normal, you will receive your results only by: Marland Kitchen MyChart Message (if you have MyChart) OR . A paper copy in the mail If you have any lab test that is abnormal or we need to change your treatment, we will call you to review the results.   Testing/Procedures: - Your physician has recommended that you have a Cardioversion (DCCV). Electrical Cardioversion uses a jolt of electricity to your heart either through paddles or wired patches attached to your chest. This is a controlled, usually prescheduled, procedure. Defibrillation is done under light anesthesia in the hospital, and you usually go home the day of the procedure. This is done to get your heart back into a normal rhythm. You are not awake for the procedure.   You are scheduled for a Cardioversion on Wednesday 10/28/19 with Dr. [name]  Please arrive at the [place] [facility] at 6:30 am on the day of your procedure.  DIET INSTRUCTIONS:  Nothing to eat or drink after midnight the night prior to your procedure         1) Labs: as above  2) Medications:  You may take all of your regular medications the morning of your procedure unless listed below:   - HOLD lasix (furosemide) the morning of your procedure  3) Must have a responsible person to drive you home.  4) Bring a current list of your medications and current insurance cards.    If you have any questions after you get home, please call the office at [phone]   Follow-Up: At [facility], you  and your health needs are our priority.  As part of our continuing mission to provide you with exceptional heart care, we have created designated Provider Care Teams.  These Care Teams include your primary Cardiologist (physician) and Advanced Practice Providers (APPs -  Physician Assistants and Nurse Practitioners) who all work together to provide you with the care you need, when you need it.  We recommend signing up for the patient portal called "MyChart".  Sign up information is provided on this After Visit Summary.  MyChart is used to connect with patients for Virtual Visits (Telemedicine).  Patients are able to view lab/test results, encounter notes, upcoming appointments, etc.  Non-urgent messages can be sent to your provider as well.   To learn more about what you can do with MyChart, go to [email or web address].    Your next appointment:   2 month(s)  The format for your next appointment:   In Person  Provider:   [name], MD   Other Instructions N/a   Electrical Cardioversion Electrical cardioversion is the delivery of a jolt of electricity to restore a normal rhythm to the heart. A rhythm that is too fast or is not regular keeps the heart from pumping well. In this procedure, sticky patches or metal paddles are placed on the chest to deliver electricity to the heart from a device. This procedure may be done in an emergency if:  There  is low or no blood pressure as a result of the heart rhythm.  Normal rhythm must be restored as fast as possible to protect the brain and heart from further damage.  It may save a life. This may also be a scheduled procedure for irregular or fast heart rhythms that are not immediately life-threatening. Tell a health care provider about:  Any allergies you have.  All medicines you are taking, including vitamins, herbs, eye drops, creams, and over-the-counter medicines.  Any problems you or family members have had with anesthetic  medicines.  Any blood disorders you have.  Any surgeries you have had.  Any medical conditions you have.  Whether you are pregnant or may be pregnant. What are the risks? Generally, this is a safe procedure. However, problems may occur, including:  Allergic reactions to medicines.  A blood clot that breaks free and travels to other parts of your body.  The possible return of an abnormal heart rhythm within hours or days after the procedure.  Your heart stopping (cardiac arrest). This is rare. What happens before the procedure? Medicines  Your health care provider may have you start taking: ? Blood-thinning medicines (anticoagulants) so your blood does not clot as easily. ? Medicines to help stabilize your heart rate and rhythm.  Ask your health care provider about: ? Changing or stopping your regular medicines. This is especially important if you are taking diabetes medicines or blood thinners. ? Taking medicines such as aspirin and ibuprofen. These medicines can thin your blood. Do not take these medicines unless your health care provider tells you to take them. ? Taking over-the-counter medicines, vitamins, herbs, and supplements. General instructions  Follow instructions from your health care provider about eating or drinking restrictions.  Plan to have someone take you home from the hospital or clinic.  If you will be going home right after the procedure, plan to have someone with you for 24 hours.  Ask your health care provider what steps will be taken to help prevent infection. These may include washing your skin with a germ-killing soap. What happens during the procedure?   An IV will be inserted into one of your veins.  Sticky patches (electrodes) or metal paddles may be placed on your chest.  You will be given a medicine to help you relax (sedative).  An electrical shock will be delivered. The procedure may vary among health care providers and  hospitals. What can I expect after the procedure?  Your blood pressure, heart rate, breathing rate, and blood oxygen level will be monitored until you leave the hospital or clinic.  Your heart rhythm will be watched to make sure it does not change.  You may have some redness on the skin where the shocks were given. Follow these instructions at home:  Do not drive for 24 hours if you were given a sedative during your procedure.  Take over-the-counter and prescription medicines only as told by your health care provider.  Ask your health care provider how to check your pulse. Check it often.  Rest for 48 hours after the procedure or as told by your health care provider.  Avoid or limit your caffeine use as told by your health care provider.  Keep all follow-up visits as told by your health care provider. This is important. Contact a health care provider if:  You feel like your heart is beating too quickly or your pulse is not regular.  You have a serious muscle cramp that does not  go away. Get help right away if:  You have discomfort in your chest.  You are dizzy or you feel faint.  You have trouble breathing or you are short of breath.  Your speech is slurred.  You have trouble moving an arm or leg on one side of your body.  Your fingers or toes turn cold or blue. Summary  Electrical cardioversion is the delivery of a jolt of electricity to restore a normal rhythm to the heart.  This procedure may be done right away in an emergency or may be a scheduled procedure if the condition is not an emergency.  Generally, this is a safe procedure.  After the procedure, check your pulse often as told by your health care provider. This information is not intended to replace advice given to you by your health care provider. Make sure you discuss any questions you have with your health care provider. Document Revised: 01/12/2019 Document Reviewed: 01/12/2019 Elsevier Patient  Education  [place].

## 2019-10-13 ENCOUNTER — Telehealth: Payer: Self-pay | Admitting: Cardiology

## 2019-10-13 NOTE — Telephone Encounter (Signed)
[facility] calling for permission to use Pneumatic Compression Device for lymphedema prn  Or preferred for 1-2 hrs q d   Notes to be faxed for review please advise.

## 2019-10-14 NOTE — Telephone Encounter (Signed)
Notes received and placed on Dr. Thereasa Solo desk for review. Message fwd to him as well

## 2019-10-15 ENCOUNTER — Encounter: Payer: Self-pay | Admitting: *Deleted

## 2019-10-15 NOTE — Telephone Encounter (Signed)
Letter generated in Epic and faxed to [phone] via [facility] fax.

## 2019-10-15 NOTE — Telephone Encounter (Signed)
I see this has been done and wanted to confirm if you received this. Thanks

## 2019-10-16 NOTE — Addendum Note (Signed)
Addended by: [name] on: 10/16/2019 04:07 PM   Modules accepted: Orders, SmartSet

## 2019-10-26 ENCOUNTER — Other Ambulatory Visit: Payer: Self-pay

## 2019-10-26 ENCOUNTER — Other Ambulatory Visit
Admission: RE | Admit: 2019-10-26 | Discharge: 2019-10-26 | Disposition: A | Discharge status: Home / Self Care | Payer: Medicare PPO | Source: Ambulatory Visit | Attending: Cardiology | Admitting: Cardiology

## 2019-10-26 DIAGNOSIS — Z20822 Contact with and (suspected) exposure to covid-19: Secondary | ICD-10-CM | POA: Diagnosis not present

## 2019-10-26 DIAGNOSIS — Z01812 Encounter for preprocedural laboratory examination: Secondary | ICD-10-CM | POA: Insufficient documentation

## 2019-10-26 LAB — SARS CORONAVIRUS 2 (TAT 6-24 HRS): SARS Coronavirus 2: NEGATIVE

## 2019-10-28 ENCOUNTER — Other Ambulatory Visit: Payer: Self-pay

## 2019-10-28 ENCOUNTER — Ambulatory Visit: Payer: Medicare PPO | Admitting: Anesthesiology

## 2019-10-28 ENCOUNTER — Ambulatory Visit
Admission: RE | Admit: 2019-10-28 | Discharge: 2019-10-28 | Disposition: A | Discharge status: Home / Self Care | Payer: Medicare PPO | Attending: Cardiology | Admitting: Cardiology

## 2019-10-28 ENCOUNTER — Encounter
Admission: RE | Disposition: A | Discharge status: Home / Self Care | Payer: Self-pay | Source: Home / Self Care | Attending: Cardiology

## 2019-10-28 ENCOUNTER — Other Ambulatory Visit: Payer: Self-pay | Admitting: Cardiology

## 2019-10-28 DIAGNOSIS — Z833 Family history of diabetes mellitus: Secondary | ICD-10-CM | POA: Insufficient documentation

## 2019-10-28 DIAGNOSIS — Z7901 Long term (current) use of anticoagulants: Secondary | ICD-10-CM | POA: Insufficient documentation

## 2019-10-28 DIAGNOSIS — Z791 Long term (current) use of non-steroidal anti-inflammatories (NSAID): Secondary | ICD-10-CM | POA: Insufficient documentation

## 2019-10-28 DIAGNOSIS — Z87891 Personal history of nicotine dependence: Secondary | ICD-10-CM | POA: Diagnosis not present

## 2019-10-28 DIAGNOSIS — Z86010 Personal history of colonic polyps: Secondary | ICD-10-CM | POA: Insufficient documentation

## 2019-10-28 DIAGNOSIS — M5126 Other intervertebral disc displacement, lumbar region: Secondary | ICD-10-CM | POA: Insufficient documentation

## 2019-10-28 DIAGNOSIS — Z881 Allergy status to other antibiotic agents status: Secondary | ICD-10-CM | POA: Diagnosis not present

## 2019-10-28 DIAGNOSIS — I4819 Other persistent atrial fibrillation: Secondary | ICD-10-CM | POA: Diagnosis not present

## 2019-10-28 DIAGNOSIS — Z888 Allergy status to other drugs, medicaments and biological substances status: Secondary | ICD-10-CM | POA: Diagnosis not present

## 2019-10-28 DIAGNOSIS — G35 Multiple sclerosis: Secondary | ICD-10-CM | POA: Insufficient documentation

## 2019-10-28 DIAGNOSIS — E039 Hypothyroidism, unspecified: Secondary | ICD-10-CM | POA: Diagnosis not present

## 2019-10-28 DIAGNOSIS — Z79899 Other long term (current) drug therapy: Secondary | ICD-10-CM | POA: Insufficient documentation

## 2019-10-28 DIAGNOSIS — I10 Essential (primary) hypertension: Secondary | ICD-10-CM | POA: Insufficient documentation

## 2019-10-28 DIAGNOSIS — E0590 Thyrotoxicosis, unspecified without thyrotoxic crisis or storm: Secondary | ICD-10-CM | POA: Diagnosis not present

## 2019-10-28 DIAGNOSIS — Z6834 Body mass index (BMI) 34.0-34.9, adult: Secondary | ICD-10-CM | POA: Diagnosis not present

## 2019-10-28 DIAGNOSIS — E669 Obesity, unspecified: Secondary | ICD-10-CM | POA: Insufficient documentation

## 2019-10-28 DIAGNOSIS — M1990 Unspecified osteoarthritis, unspecified site: Secondary | ICD-10-CM | POA: Insufficient documentation

## 2019-10-28 SURGERY — CARDIOVERSION
Anesthesia: General

## 2019-10-28 MED ORDER — PROPRANOLOL HCL ER 80 MG PO CP24: 80.0000 mg | ORAL_CAPSULE | Freq: Two times a day (BID) | ORAL | 3 refills | Status: DC

## 2019-10-28 MED ORDER — POTASSIUM CHLORIDE ER 10 MEQ PO TBCR: 10.0000 meq | EXTENDED_RELEASE_TABLET | Freq: Every day | ORAL | 3 refills | Status: DC

## 2019-10-28 MED ORDER — PROPOFOL 10 MG/ML IV BOLUS
INTRAVENOUS | Status: DC | PRN
  Administered 2019-10-28: 20 mg via INTRAVENOUS
  Administered 2019-10-28: 80 mg via INTRAVENOUS

## 2019-10-28 MED ORDER — DILTIAZEM HCL ER COATED BEADS 360 MG PO CP24: 360.0000 mg | ORAL_CAPSULE | Freq: Every day | ORAL | 3 refills | Status: DC

## 2019-10-28 MED ORDER — APIXABAN 5 MG PO TABS: 5.0000 mg | ORAL_TABLET | Freq: Two times a day (BID) | ORAL | 1 refills | Status: DC

## 2019-10-28 MED ORDER — SODIUM CHLORIDE 0.9 % IV SOLN: INTRAVENOUS | Status: DC

## 2019-10-28 NOTE — Telephone Encounter (Signed)
Pt's age 64, wt 120.7 kg, SCr 1.01, CrCl 127.8, last ov w/ BA 10/09/19. Eliquis refill sent in as requested.

## 2019-10-28 NOTE — H&P (Signed)
INTERVAL HISTORY: The patient reports today for DC cardioversion due to atrial fib. He has taken uninterupted eliquis for at least 4 weeks.    Today's Blood pressure 111/64   EKG personally reviewed by myself on todays visit Shows atrial fib . HR 99-102 bpm.    Cardiology Office Note:    Date:  10/28/2019   ID:  Dustin Gray, DOB Dec 24, 1955, MRN [id number]  PCP:  [name], MD  Cardiologist:  [name], MD  Electrophysiologist:  None   Referring MD: No ref. provider found   No chief complaint on file.   History of Present Illness:    Dustin Gray is a 64 y.o. male with a hx of multiple sclerosis, lymphedema, hypothyroidism atrial fibrillation who presents for follow-up.  Patient recently seen in the hospital for atrial fibrillation with rapid ventricular response.  He was managed with diltiazem and Eliquis.  He was subsequently diagnosed with hyperthyroidism and propanolol ER added to his medical regimen.  Echocardiogram on 09/2019 showed normal ejection fraction with EF 55 to 60%, left atrial size was also normal.  He was started on anticoagulation due to plan for cardioversion after uninterrupted anticoagulation if he stays in A. fib.  Past Medical History:  Diagnosis Date  . Abscess    groin  . Arthritis    lower spine  . Erectile dysfunction   . Low testosterone   . Lumbar herniated disc    L5  . Multiple sclerosis ([place])   . Staph aureus infection     Past Surgical History:  Procedure Laterality Date  . COLONOSCOPY WITH PROPOFOL N/A 07/04/2016   Procedure: COLONOSCOPY WITH PROPOFOL;  Surgeon: [name], MD;  Location: [place];  Service: Endoscopy;  Laterality: N/A;  . [place]  . MASS EXCISION  1960  . POLYPECTOMY  07/04/2016   Procedure: POLYPECTOMY;  Surgeon: [name], MD;  Location: [facility];  Service: Endoscopy;;  . TONSILLECTOMY      Current Medications: Current Meds  Medication Sig  . apixaban  (ELIQUIS) 5 MG TABS tablet Take 1 tablet (5 mg total) by mouth 2 (two) times daily.  . baclofen (LIORESAL) 10 MG tablet Take 10 mg by mouth 3 (three) times daily.    . digoxin (LANOXIN) 0.125 MG tablet Take 1 tablet (0.125 mg total) by mouth daily. (Patient taking differently: Take 0.125 mg by mouth every evening. )  . diltiazem (CARDIZEM CD) 360 MG 24 hr capsule Take 1 capsule (360 mg total) by mouth daily.  . Dimethyl Fumarate (TECFIDERA) 240 MG CPDR Take 240 mg by mouth 2 (two) times daily.   . furosemide (LASIX) 40 MG tablet Take 1 tablet (40 mg total) by mouth 2 (two) times daily.  Marland Kitchen gabapentin (NEURONTIN) 300 MG capsule Take 300-600 mg by mouth See admin instructions. Take 1 capsule (300 mg) by mouth in the morning, take 1 capsule (300 mg) by mouth at midday, & take 2 capsules (600 mg) by mouth at bedtime.  . meloxicam (MOBIC) 15 MG tablet Take 15 mg by mouth daily as needed for pain.   . methimazole (TAPAZOLE) 10 MG tablet Take 3 tablets (30 mg total) by mouth daily.  . potassium chloride (KLOR-CON) 10 MEQ tablet Take 1 tablet (10 mEq total) by mouth daily.  . propranolol ER (INDERAL LA) 80 MG 24 hr capsule Take 1 capsule (80 mg total) by mouth 2 (two) times daily.     Allergies:   Cephalexin, Ancef [cefazolin sodium],  Cefadroxil, and Cefazolin   Social History   Socioeconomic History  . Marital status: Legally Separated    Spouse name: Not on file  . Number of children: Not on file  . Years of education: Not on file  . Highest education level: Not on file  Occupational History  . Not on file  Tobacco Use  . Smoking status: Former Research scientist (life sciences)  . Smokeless tobacco: Never Used  . Tobacco comment: smoked as teenager  Substance and Sexual Activity  . Alcohol use: Yes    Comment: 2 drinks/month  . Drug use: No  . Sexual activity: Not on file  Other Topics Concern  . Not on file  Social History Narrative  . Not on file   Social Determinants of Health   Financial Resource Strain:    . Difficulty of Paying Living Expenses:   Food Insecurity:   . Worried About Charity fundraiser in the Last Year:   . Arboriculturist in the Last Year:   Transportation Needs:   . Film/video editor (Medical):   Marland Kitchen Lack of Transportation (Non-Medical):   Physical Activity:   . Days of Exercise per Week:   . Minutes of Exercise per Session:   Stress:   . Feeling of Stress :   Social Connections:   . Frequency of Communication with Friends and Family:   . Frequency of Social Gatherings with Friends and Family:   . Attends Religious Services:   . Active Member of Clubs or Organizations:   . Attends Archivist Meetings:   Marland Kitchen Marital Status:      Family History: The patient's family history includes Diabetes in his father.  ROS:   Please see the history of present illness.     All other systems reviewed and are negative.  EKGs/Labs/Other Studies Reviewed:    The following studies were reviewed today:   EKG:  EKG is  ordered today.  The ekg ordered today demonstrates clear fibrillation, RVR, heart rate 129  Recent Labs: 09/30/2019: B Natriuretic Peptide 200.0; TSH <0.010 10/01/2019: Magnesium 2.0 10/02/2019: ALT 26; Hemoglobin 14.0; Platelets 241 10/03/2019: BUN 17; Creatinine, Ser 1.01; Potassium 4.1; Sodium 135  Recent Lipid Panel    Component Value Date/Time   CHOL 108 10/01/2019 0358   TRIG 81 10/01/2019 0358   HDL 35 (L) 10/01/2019 0358   CHOLHDL 3.1 10/01/2019 0358   VLDL 16 10/01/2019 0358   LDLCALC 57 10/01/2019 0358    Physical Exam:    VS:  BP 133/71   Pulse 93   Temp 98.2 F (36.8 C) (Oral)   Resp 20   Ht 6\' 2"  (1.88 m)   Wt 120.7 kg   SpO2 95%   BMI 34.15 kg/m     Wt Readings from Last 3 Encounters:  10/28/19 120.7 kg  10/09/19 121.7 kg  10/02/19 122.6 kg     GEN:  Well nourished, well developed in no acute distress HEENT: Normal NECK: No JVD; No carotid bruits LYMPHATICS: No lymphadenopathy CARDIAC: Irregular irregular, no  murmurs, rubs, gallops RESPIRATORY:  Clear to auscultation without rales, wheezing or rhonchi  ABDOMEN: Soft, non-tender, non-distended MUSCULOSKELETAL:  trace edema; No deformity  SKIN: Warm and dry NEUROLOGIC:  Alert and oriented x 3 PSYCHIATRIC:  Normal affect   ASSESSMENT:    No diagnosis found. PLAN:    In order of problems listed above:  1. Patient with history of persistent atrial fibrillation.  Heart rate still elevated on diltiazem 360,  Inderal 80 mg twice daily.  We will add digoxin 0.125 mg daily.  Continue Eliquis 5 mg twice daily.  Patient has been on anticoagulation uninterrupted for 1 week now.  We will schedule a cardioversion in 3 weeks from now.  Hypothyroidism is contributing to A. fib with tachycardia.  Hopefully, rates improved as his thyroid function becomes better. 2. History of hypothyroidism.  On methimazole.  Endocrinology following.  Appreciate recommendations.  This note was generated in part or whole with voice recognition software. Voice recognition is usually quite accurate but there are transcription errors that can and very often do occur. I apologize for any typographical errors that were not detected and corrected.  Medication Adjustments/Labs and Tests Ordered: Current medicines are reviewed at length with the patient today.  Concerns regarding medicines are outlined above.  Orders Placed This Encounter  Procedures  . Protime-INR  . Informed Consent Details: Physician/Practitioner Attestation; Transcribe to consent form and obtain patient signature  . Advance diet as tolerated  . If diabetic or glucose greater than 140 notify MD to place Glycemic Control Order Set  . Vital signs every 4 hours x 4, then every 8 hours and PRN  . Vital signs post cardioversion  . Document by rhythm strip atrial fibrillation or atrial flutter. If patient is in NSR, place order for and obtain EKG, call MD  . If patient is a diabetic, place order for and obtain CBG  . If  patient is on Coumadin, place order for and obtain PT-INR and document INR is between 2-3  . If patient is on dabigatran (PRADAXA) or apixaban [facility]), verify that patient currently has taken 5 consecutive doses  . If patient is on rivaroxaban [facility]), verify that patient currently has taken 3 consecutive doses  . If patient is taking diuretic or labs are older than 2 days, place order for and obtain BMET  . Patient to wear a single hospital gown - Ask patient to remove dentures, if any  . Provide equipment / supplies at bedside  . Remove and safely store all jewelry.  Tape rings in place that cannot be removed.  . Shave left side of chest and back if necessary for pad placement  . Verify informed consent  . EKG 12-Lead  . EKG 12-Lead pre-cardioversion  . EKG 12-Lead  . EKG 12-Lead  . Insert peripheral IV   Meds ordered this encounter  Medications  . 0.9 %  sodium chloride infusion    There are no outpatient Patient Instructions on file for this admission.   Signed, [name], MD  10/28/2019 7:44 AM    [facility]

## 2019-10-28 NOTE — Transfer of Care (Signed)
Immediate Anesthesia Transfer of Care Note  Patient: Dustin Gray  Procedure(s) Performed: CARDIOVERSION (N/A )  Patient Location: PACU  Anesthesia Type:General  Level of Consciousness: sedated  Airway & Oxygen Therapy: Patient Spontanous Breathing and Patient connected to nasal cannula oxygen  Post-op Assessment: Report given to RN and Post -op Vital signs reviewed and stable  Post vital signs: Reviewed and stable  Last Vitals:  Vitals Value Taken Time  BP 119/64 10/28/19 0745  Temp    Pulse 96 10/28/19 0748  Resp 19 10/28/19 0748  SpO2 91 % 10/28/19 0748    Last Pain:  Vitals:   10/28/19 0704  TempSrc: Oral         Complications: No apparent anesthesia complications

## 2019-10-28 NOTE — Anesthesia Postprocedure Evaluation (Signed)
Anesthesia Post Note  Patient: Dustin Gray  Procedure(s) Performed: CARDIOVERSION (N/A )  Patient location during evaluation: Other (specials recovery) Anesthesia Type: General Level of consciousness: awake and alert and oriented Pain management: pain level controlled Vital Signs Assessment: post-procedure vital signs reviewed and stable Respiratory status: spontaneous breathing, nonlabored ventilation and respiratory function stable Cardiovascular status: blood pressure returned to baseline and stable Postop Assessment: no signs of nausea or vomiting Anesthetic complications: no     Last Vitals:  Vitals:   10/28/19 0756 10/28/19 0800  BP: 111/65 125/68  Pulse: 97 (!) 106  Resp: (!) 23 (!) 25  Temp:    SpO2: 97% 98%    Last Pain:  Vitals:   10/28/19 0800  TempSrc:   PainSc: 0-No pain                 Josephene Marrone

## 2019-10-28 NOTE — Discharge Instructions (Signed)
Moderate Conscious Sedation, Adult, Care After These instructions provide you with information about caring for yourself after your procedure. Your health care provider may also give you more specific instructions. Your treatment has been planned according to current medical practices, but problems sometimes occur. Call your health care provider if you have any problems or questions after your procedure. What can I expect after the procedure? After your procedure, it is common:  To feel sleepy for several hours.  To feel clumsy and have poor balance for several hours.  To have poor judgment for several hours.  To vomit if you eat too soon. Follow these instructions at home: For at least 24 hours after the procedure:   Do not: ? Participate in activities where you could fall or become injured. ? Drive. ? Use heavy machinery. ? Drink alcohol. ? Take sleeping pills or medicines that cause drowsiness. ? Make important decisions or sign legal documents. ? Take care of children on your own.  Rest. Eating and drinking  Follow the diet recommended by your health care provider.  If you vomit: ? Drink water, juice, or soup when you can drink without vomiting. ? Make sure you have little or no nausea before eating solid foods. General instructions  Have a responsible adult stay with you until you are awake and alert.  Take over-the-counter and prescription medicines only as told by your health care provider.  If you smoke, do not smoke without supervision.  Keep all follow-up visits as told by your health care provider. This is important. Contact a health care provider if:  You keep feeling nauseous or you keep vomiting.  You feel light-headed.  You develop a rash.  You have a fever. Get help right away if:  You have trouble breathing. This information is not intended to replace advice given to you by your health care provider. Make sure you discuss any questions you have  with your health care provider. Document Revised: 05/24/2017 Document Reviewed: 10/01/2015 Elsevier Patient Education  2020 Elsevier Inc. Electrical Cardioversion Electrical cardioversion is the delivery of a jolt of electricity to restore a normal rhythm to the heart. A rhythm that is too fast or is not regular keeps the heart from pumping well. In this procedure, sticky patches or metal paddles are placed on the chest to deliver electricity to the heart from a device. This procedure may be done in an emergency if:  There is low or no blood pressure as a result of the heart rhythm.  Normal rhythm must be restored as fast as possible to protect the brain and heart from further damage.  It may save a life. This may also be a scheduled procedure for irregular or fast heart rhythms that are not immediately life-threatening. Tell a health care provider about:  Any allergies you have.  All medicines you are taking, including vitamins, herbs, eye drops, creams, and over-the-counter medicines.  Any problems you or family members have had with anesthetic medicines.  Any blood disorders you have.  Any surgeries you have had.  Any medical conditions you have.  Whether you are pregnant or may be pregnant. What are the risks? Generally, this is a safe procedure. However, problems may occur, including:  Allergic reactions to medicines.  A blood clot that breaks free and travels to other parts of your body.  The possible return of an abnormal heart rhythm within hours or days after the procedure.  Your heart stopping (cardiac arrest). This is rare. What happens   before the procedure? Medicines  Your health care provider may have you start taking: ? Blood-thinning medicines (anticoagulants) so your blood does not clot as easily. ? Medicines to help stabilize your heart rate and rhythm.  Ask your health care provider about: ? Changing or stopping your regular medicines. This is  especially important if you are taking diabetes medicines or blood thinners. ? Taking medicines such as aspirin and ibuprofen. These medicines can thin your blood. Do not take these medicines unless your health care provider tells you to take them. ? Taking over-the-counter medicines, vitamins, herbs, and supplements. General instructions  Follow instructions from your health care provider about eating or drinking restrictions.  Plan to have someone take you home from the hospital or clinic.  If you will be going home right after the procedure, plan to have someone with you for 24 hours.  Ask your health care provider what steps will be taken to help prevent infection. These may include washing your skin with a germ-killing soap. What happens during the procedure?   An IV will be inserted into one of your veins.  Sticky patches (electrodes) or metal paddles may be placed on your chest.  You will be given a medicine to help you relax (sedative).  An electrical shock will be delivered. The procedure may vary among health care providers and hospitals. What can I expect after the procedure?  Your blood pressure, heart rate, breathing rate, and blood oxygen level will be monitored until you leave the hospital or clinic.  Your heart rhythm will be watched to make sure it does not change.  You may have some redness on the skin where the shocks were given. Follow these instructions at home:  Do not drive for 24 hours if you were given a sedative during your procedure.  Take over-the-counter and prescription medicines only as told by your health care provider.  Ask your health care provider how to check your pulse. Check it often.  Rest for 48 hours after the procedure or as told by your health care provider.  Avoid or limit your caffeine use as told by your health care provider.  Keep all follow-up visits as told by your health care provider. This is important. Contact a health  care provider if:  You feel like your heart is beating too quickly or your pulse is not regular.  You have a serious muscle cramp that does not go away. Get help right away if:  You have discomfort in your chest.  You are dizzy or you feel faint.  You have trouble breathing or you are short of breath.  Your speech is slurred.  You have trouble moving an arm or leg on one side of your body.  Your fingers or toes turn cold or blue. Summary  Electrical cardioversion is the delivery of a jolt of electricity to restore a normal rhythm to the heart.  This procedure may be done right away in an emergency or may be a scheduled procedure if the condition is not an emergency.  Generally, this is a safe procedure.  After the procedure, check your pulse often as told by your health care provider. This information is not intended to replace advice given to you by your health care provider. Make sure you discuss any questions you have with your health care provider. Document Revised: 01/12/2019 Document Reviewed: 01/12/2019 Elsevier Patient Education  2020 Elsevier Inc.  

## 2019-10-28 NOTE — Procedures (Signed)
Cardioversion procedure note For atrial fibrillation.  Procedure Details:  Consent: Risks of procedure as well as the alternatives and risks of each were explained to the (patient/caregiver).  Consent for procedure obtained.  Time Out: Verified patient identification, verified procedure, site/side was marked, verified correct patient position, special equipment/implants available, medications/allergies/relevent history reviewed, required imaging and test results available.  Performed  Patient placed on cardiac monitor, pulse oximetry, supplemental oxygen as necessary.   Sedation given: propofol IV, anesthesia team  Pacer pads placed anterior and posterior chest.   Cardioverted 3 time(s) at 200J Synchronized biphasic but unsuccessful  Patient remains in afib, HR 99-103   Evaluation: Findings: Post procedure EKG shows: atrial fibrillation Complications: None Patient did tolerate procedure well.  Time Spent Directly with the Patient:  45 minutes   [name], M.D.

## 2019-10-28 NOTE — Anesthesia Preprocedure Evaluation (Signed)
Anesthesia Evaluation  Patient identified by MRN, date of birth, ID band Patient awake    Reviewed: Allergy & Precautions, NPO status , Patient's Chart, lab work & pertinent test results  History of Anesthesia Complications Negative for: history of anesthetic complications  Airway Mallampati: II  TM Distance: >3 FB Neck ROM: Full    Dental no notable dental hx.    Pulmonary neg sleep apnea, neg COPD, former smoker,    breath sounds clear to auscultation- rhonchi (-) wheezing      Cardiovascular Exercise Tolerance: Good hypertension, (-) CAD, (-) Past MI, (-) Cardiac Stents and (-) CABG + dysrhythmias Atrial Fibrillation  Rhythm:Regular Rate:Normal - Systolic murmurs and - Diastolic murmurs    Neuro/Psych neg Seizures negative neurological ROS  negative psych ROS   GI/Hepatic negative GI ROS, Neg liver ROS,   Endo/Other  neg diabetes  Renal/GU negative Renal ROS     Musculoskeletal  (+) Arthritis ,   Abdominal (+) + obese,   Peds  Hematology negative hematology ROS (+)   Anesthesia Other Findings Past Medical History: No date: Abscess     Comment:  groin No date: Arthritis     Comment:  lower spine No date: Erectile dysfunction No date: Low testosterone No date: Lumbar herniated disc     Comment:  L5 No date: Multiple sclerosis (HCC) No date: Staph aureus infection   Reproductive/Obstetrics                             Anesthesia Physical Anesthesia Plan  ASA: III  Anesthesia Plan: General   Post-op Pain Management:    Induction: Intravenous  PONV Risk Score and Plan: 1 and Propofol infusion  Airway Management Planned: Natural Airway  Additional Equipment:   Intra-op Plan:   Post-operative Plan:   Informed Consent: I have reviewed the patients History and Physical, chart, labs and discussed the procedure including the risks, benefits and alternatives for the proposed  anesthesia with the patient or authorized representative who has indicated his/her understanding and acceptance.     Dental advisory given  Plan Discussed with: CRNA and Anesthesiologist  Anesthesia Plan Comments:         Anesthesia Quick Evaluation

## 2019-10-28 NOTE — Telephone Encounter (Signed)
*  STAT* If patient is at the pharmacy, call can be transferred to refill team.   1. Which medications need to be refilled? (please list name of each medication and dose if known)  Diltiazem 360 MG 1 tablet daily Potassium chloride 10 MEQ 1 tablet daily Propranolol 80 MG 1 capsule 2 times daily Eliquis 5 MG 1 tablet 2 times daily   2. Which pharmacy/location (including street and city if local pharmacy) is medication to be sent to? [facility] on [address].   3. Do they need a 30 day or 90 day supply? 90 day

## 2019-10-29 ENCOUNTER — Telehealth: Payer: Self-pay | Admitting: Licensed Clinical Social Worker

## 2019-10-29 NOTE — Telephone Encounter (Signed)
CSW consulted to help get pt a BP cuff so he can monitor at home.  CSW called pt an confirmed he is agreeable to one being shipped out- confirmed home address- CSW placed order- anticipated delivery in next 1-3 days  Jorge Ny, [facility] Desk#: [phone] Cell#: [phone]

## 2019-10-30 ENCOUNTER — Other Ambulatory Visit: Payer: Self-pay | Admitting: Orthopedic Surgery

## 2019-10-30 DIAGNOSIS — M25551 Pain in right hip: Secondary | ICD-10-CM

## 2019-10-30 DIAGNOSIS — M25559 Pain in unspecified hip: Secondary | ICD-10-CM

## 2019-10-30 DIAGNOSIS — M24851 Other specific joint derangements of right hip, not elsewhere classified: Secondary | ICD-10-CM

## 2019-10-30 DIAGNOSIS — G8929 Other chronic pain: Secondary | ICD-10-CM

## 2019-11-11 ENCOUNTER — Telehealth: Payer: Self-pay | Admitting: Student

## 2019-11-11 NOTE — Telephone Encounter (Signed)
   Received page from Answering Service about concerns for heart rate. Patient has history of persistent atrial fibrillation. Recently had unsuccessful DCCV. Rates have been difficult to control. He is on Cardizem 360mg  daily, Propranolol 80mg  twice daily, and Digoxin 0.125mg  daily. Heart rates this evening in mid 50's to 60's. BP in the 120's/60's. He is feeling completely fine. Wife said he looked a little flushed today but patient denies any cardiac symptoms. He wanted to make sure it was OK for him to take his Propranolol and Digoxin tonight. Rates are currently well controlled so would like to not decrease medications and risk rates becoming elevated again. Patient also having several PVCs on last EKG so I am wondering if heart rate is reading falsely low due to premature beats.  Recommend taking evening medications. However, advised patient to check his heart this evening about 2 hours after taking medications and let us know if heart rate <50 bpm or if he is feeling symptomatic. Patient voiced understanding and agreed.  Darreld Mclean, PA-C 11/11/2019 6:27 PM

## 2019-12-08 ENCOUNTER — Telehealth: Payer: Self-pay | Admitting: Cardiology

## 2019-12-08 NOTE — Telephone Encounter (Signed)
This encounter was created in error - please disregard.

## 2019-12-08 NOTE — Telephone Encounter (Signed)
   [facility] Pre-operative Risk Assessment    HEARTCARE STAFF: - Please ensure there is not already an duplicate clearance open for this procedure. - Under Visit Info/Reason for Call, type in Other and utilize the format Clearance MM/DD/YY or Clearance TBD. Do not use dashes or single digits. - If request is for dental extraction, please clarify the # of teeth to be extracted.  Request for surgical clearance:  1. What type of surgery is being performed? EXTRACTIONS, PERIODONTAL CLEANING AND/OR FILLINGS  2. When is this surgery scheduled? TBD  3. What type of clearance is required (medical clearance vs. Pharmacy clearance to hold med vs. Both)? NOT LISTED  4. Are there any medications that need to be held prior to surgery and how long? NOT LISTED  5. Practice name and name of physician performing surgery? [facility]  6. What is the office phone number? [phone]   7.   What is the office fax number? [phone]  8.   Anesthesia type (None, local, MAC, general) ? NOT LISTED   Dustin Gray 12/08/2019, 3:00 PM  _________________________________________________________________   (provider comments below)

## 2019-12-09 NOTE — Telephone Encounter (Signed)
Attempted to reach office, No answer Lvm

## 2019-12-10 ENCOUNTER — Other Ambulatory Visit: Payer: Self-pay

## 2019-12-10 ENCOUNTER — Ambulatory Visit: Payer: Medicare PPO | Admitting: Cardiology

## 2019-12-10 ENCOUNTER — Encounter: Payer: Self-pay | Admitting: Cardiology

## 2019-12-10 VITALS — BP 120/70 | HR 69 | Ht 74.0 in | Wt 261.0 lb

## 2019-12-10 DIAGNOSIS — E0590 Thyrotoxicosis, unspecified without thyrotoxic crisis or storm: Secondary | ICD-10-CM | POA: Diagnosis not present

## 2019-12-10 DIAGNOSIS — I480 Paroxysmal atrial fibrillation: Secondary | ICD-10-CM

## 2019-12-10 MED ORDER — PROPRANOLOL HCL ER 80 MG PO CP24: 80.0000 mg | ORAL_CAPSULE | Freq: Every day | ORAL | 6 refills | Status: DC

## 2019-12-10 NOTE — Telephone Encounter (Signed)
   Primary Cardiologist: [name], MD  Chart reviewed as part of pre-operative protocol coverage. Simple dental extractions are considered low risk procedures per guidelines and generally do not require any specific cardiac clearance. It is also generally accepted that for simple extractions and dental cleanings, there is no need to interrupt blood thinner therapy.   SBE prophylaxis is not required for the patient.  I will route this recommendation to the requesting party via Epic fax function and remove from pre-op pool.  Please call with questions.  Deberah Pelton, NP 12/10/2019, 11:37 AM

## 2019-12-10 NOTE — Telephone Encounter (Signed)
I s/w Melissa with dental office. Melissa states pt is not having any procedures at this time. Pt has an appt for a routine ov with DDS. Melissa states they sent request in case in the future. I advised Melissa to please send another clearance request with specifics once pt has seen DDS. Advised depending on when procedures may be done may require for the pt to be seen by their cardiologist per our Pre Op Protocol. Melissa thanked me for the call. I will update the pre op team. I will remove from the pre op call back pool.

## 2019-12-10 NOTE — Progress Notes (Signed)
Cardiology Office Note:    Date:  12/10/2019   ID:  Dustin Gray, DOB Jun 13, 1956, MRN [id number]  PCP:  [name], MD  Cardiologist:  [name], MD  Electrophysiologist:  None   Referring MD: [name], MD   Chief Complaint  Patient presents with  . other    Follow up Cardioversion. Meds reviewed by the pt. verbally. Pt. c/o LE edema.     History of Present Illness:    Dustin Gray is a 64 y.o. male with a hx of multiple sclerosis, lymphedema, hypothyroidism/Graves' disease, persistent atrial fibrillation who presents for follow-up.  Patient was last evaluated in the hospital for possible cardioversion due to persistent atrial fibrillation.  Cardioversion was unsuccessful.  Rate control medications include diltiazem, propranolol and digoxin.  Methimazole was started due to hyperthyroidism, patient eventually diagnosed with Graves' disease.  He is following up with endocrinology for medication titration.  Patient states feeling well, even though cardioversion did not work, he started feeling a little better after.  Historical notes Patient originally seen in the hospital for atrial fibrillation with rapid ventricular response.  He was managed with diltiazem and Eliquis.  He was subsequently diagnosed with hyperthyroidism and propanolol ER added to his medical regimen.  Echocardiogram on 09/2019 showed normal ejection fraction with EF 55 to 60%, left atrial size was also normal.  He was started on anticoagulation due to plan for cardioversion after uninterrupted anticoagulation if he stays in A. fib.  Past Medical History:  Diagnosis Date  . Abscess    groin  . Arthritis    lower spine  . Erectile dysfunction   . Low testosterone   . Lumbar herniated disc    L5  . Multiple sclerosis ([place])   . Staph aureus infection     Past Surgical History:  Procedure Laterality Date  . COLONOSCOPY WITH PROPOFOL N/A 07/04/2016   Procedure: COLONOSCOPY WITH PROPOFOL;   Surgeon: [name], MD;  Location: [facility];  Service: Endoscopy;  Laterality: N/A;  . [place]  . MASS EXCISION  1960  . POLYPECTOMY  07/04/2016   Procedure: POLYPECTOMY;  Surgeon: [name], MD;  [facility];  Service: Endoscopy;;  . TONSILLECTOMY      Current Medications: Current Meds  Medication Sig  . apixaban (ELIQUIS) 5 MG TABS tablet Take 1 tablet (5 mg total) by mouth 2 (two) times daily.  . baclofen (LIORESAL) 10 MG tablet Take 10 mg by mouth 3 (three) times daily.    Marland Kitchen diltiazem (CARDIZEM CD) 360 MG 24 hr capsule Take 1 capsule (360 mg total) by mouth daily.  . Dimethyl Fumarate (TECFIDERA) 240 MG CPDR Take 240 mg by mouth 2 (two) times daily.   . furosemide (LASIX) 40 MG tablet Take 1 tablet (40 mg total) by mouth 2 (two) times daily.  Marland Kitchen gabapentin (NEURONTIN) 300 MG capsule Take 300-600 mg by mouth See admin instructions. Take 1 capsule (300 mg) by mouth in the morning, take 1 capsule (300 mg) by mouth at midday, & take 2 capsules (600 mg) by mouth at bedtime.  . meloxicam (MOBIC) 15 MG tablet Take 15 mg by mouth daily as needed for pain.   . methimazole (TAPAZOLE) 10 MG tablet Take 3 tablets (30 mg total) by mouth daily. (Patient taking differently: Take 10 mg by mouth daily. )  . potassium chloride (KLOR-CON) 10 MEQ tablet Take 1 tablet (10 mEq total) by mouth daily.  . propranolol ER (INDERAL LA)  80 MG 24 hr capsule Take 1 capsule (80 mg total) by mouth daily.  . [DISCONTINUED] digoxin (LANOXIN) 0.125 MG tablet Take 1 tablet (0.125 mg total) by mouth daily. (Patient taking differently: Take 0.125 mg by mouth every evening. )  . [DISCONTINUED] propranolol ER (INDERAL LA) 80 MG 24 hr capsule Take 1 capsule (80 mg total) by mouth 2 (two) times daily.     Allergies:   Cephalexin, Ancef [cefazolin sodium], Cefadroxil, and Cefazolin   Social History   Socioeconomic History  . Marital status: Legally Separated    Spouse name: Not on  file  . Number of children: Not on file  . Years of education: Not on file  . Highest education level: Not on file  Occupational History  . Not on file  Tobacco Use  . Smoking status: Former Research scientist (life sciences)  . Smokeless tobacco: Never Used  . Tobacco comment: smoked as teenager  Substance and Sexual Activity  . Alcohol use: Yes    Comment: 2 drinks/month  . Drug use: No  . Sexual activity: Not on file  Other Topics Concern  . Not on file  Social History Narrative  . Not on file   Social Determinants of Health   Financial Resource Strain:   . Difficulty of Paying Living Expenses:   Food Insecurity:   . Worried About Charity fundraiser in the Last Year:   . Arboriculturist in the Last Year:   Transportation Needs:   . Film/video editor (Medical):   Marland Kitchen Lack of Transportation (Non-Medical):   Physical Activity:   . Days of Exercise per Week:   . Minutes of Exercise per Session:   Stress:   . Feeling of Stress :   Social Connections:   . Frequency of Communication with Friends and Family:   . Frequency of Social Gatherings with Friends and Family:   . Attends Religious Services:   . Active Member of Clubs or Organizations:   . Attends Archivist Meetings:   Marland Kitchen Marital Status:      Family History: The patient's family history includes Diabetes in his father.  ROS:   Please see the history of present illness.     All other systems reviewed and are negative.  EKGs/Labs/Other Studies Reviewed:    The following studies were reviewed today:   EKG:  EKG is  ordered today.  The ekg ordered today demonstrates normal sinus rhythm, normal ECG.  Recent Labs: 09/30/2019: B Natriuretic Peptide 200.0; TSH <0.010 10/01/2019: Magnesium 2.0 10/02/2019: ALT 26; Hemoglobin 14.0; Platelets 241 10/03/2019: BUN 17; Creatinine, Ser 1.01; Potassium 4.1; Sodium 135  Recent Lipid Panel    Component Value Date/Time   CHOL 108 10/01/2019 0358   TRIG 81 10/01/2019 0358   HDL 35 (L)  10/01/2019 0358   CHOLHDL 3.1 10/01/2019 0358   VLDL 16 10/01/2019 0358   LDLCALC 57 10/01/2019 0358    Physical Exam:    VS:  BP 120/70 (BP Location: Left Arm, Patient Position: Sitting, Cuff Size: Normal)   Pulse 69   Ht 6\' 2"  (1.88 m)   Wt 261 lb (118.4 kg)   SpO2 98%   BMI 33.51 kg/m     Wt Readings from Last 3 Encounters:  12/10/19 261 lb (118.4 kg)  10/28/19 266 lb (120.7 kg)  10/09/19 268 lb 6 oz (121.7 kg)     GEN:  Well nourished, well developed in no acute distress HEENT: Normal NECK: No JVD; No carotid  bruits LYMPHATICS: No lymphadenopathy CARDIAC: Irregular irregular, no murmurs, rubs, gallops RESPIRATORY:  Clear to auscultation without rales, wheezing or rhonchi  ABDOMEN: Soft, non-tender, non-distended MUSCULOSKELETAL:  trace edema; No deformity  SKIN: Warm and dry NEUROLOGIC:  Alert and oriented x 3 PSYCHIATRIC:  Normal affect   ASSESSMENT:    1. Paroxysmal A-fib ([place])   2. Hyperthyroidism    PLAN:    In order of problems listed above:  1. Patient with history of paroxysmal atrial fibrillation.  Previously failed cardioversion.  A. fib likely driven by hyperthyroidism.  EKG today shows normal sinus rhythm, heart rate 69.  Stop digoxin, decrease propanolol ER to 80 mg daily.  Continue Cardizem, continue Eliquis 5 mg twice daily. 2. History of hypothyroidism.  On methimazole.  Endocrinology following and actively making changes with medications.  Management per endocrinology greatly appreciated as this most likely helped patient's rhythm control.  Follow-up in 3 months  This note was generated in part or whole with voice recognition software. Voice recognition is usually quite accurate but there are transcription errors that can and very often do occur. I apologize for any typographical errors that were not detected and corrected.  Medication Adjustments/Labs and Tests Ordered: Current medicines are reviewed at length with the patient today.  Concerns  regarding medicines are outlined above.  Orders Placed This Encounter  Procedures  . EKG 12-Lead   Meds ordered this encounter  Medications  . propranolol ER (INDERAL LA) 80 MG 24 hr capsule    Sig: Take 1 capsule (80 mg total) by mouth daily.    Dispense:  30 capsule    Refill:  6    Patient Instructions  Medication Instructions:   Your physician has recommended you make the following change in your medication:   1.  STOP taking your Digoxin. 2.  DECREASE your Propranolol, Take 1 capsule (80 mg total) by mouth daily.  *If you need a refill on your cardiac medications before your next appointment, please call your pharmacy*   Lab Work: None Ordered. If you have labs (blood work) drawn today and your tests are completely normal, you will receive your results only by: Marland Kitchen MyChart Message (if you have MyChart) OR . A paper copy in the mail If you have any lab test that is abnormal or we need to change your treatment, we will call you to review the results.   Testing/Procedures: None Ordered.   Follow-Up: At [facility], you and your health needs are our priority.  As part of our continuing mission to provide you with exceptional heart care, we have created designated Provider Care Teams.  These Care Teams include your primary Cardiologist (physician) and Advanced Practice Providers (APPs -  Physician Assistants and Nurse Practitioners) who all work together to provide you with the care you need, when you need it.  We recommend signing up for the patient portal called "MyChart".  Sign up information is provided on this After Visit Summary.  MyChart is used to connect with patients for Virtual Visits (Telemedicine).  Patients are able to view lab/test results, encounter notes, upcoming appointments, etc.  Non-urgent messages can be sent to your provider as well.   To learn more about what you can do with MyChart, go to [email or web address].    Your next appointment:   3  month(s)  The format for your next appointment:   In Person  Provider:   [name], MD   Other Instructions N/A     Signed, Aaron Edelman  Ag[place], MD  12/10/2019 12:47 PM    [facility]

## 2019-12-10 NOTE — Patient Instructions (Signed)
Medication Instructions:   Your physician has recommended you make the following change in your medication:   1.  STOP taking your Digoxin. 2.  DECREASE your Propranolol, Take 1 capsule (80 mg total) by mouth daily.  *If you need a refill on your cardiac medications before your next appointment, please call your pharmacy*   Lab Work: None Ordered. If you have labs (blood work) drawn today and your tests are completely normal, you will receive your results only by: Marland Kitchen MyChart Message (if you have MyChart) OR . A paper copy in the mail If you have any lab test that is abnormal or we need to change your treatment, we will call you to review the results.   Testing/Procedures: None Ordered.   Follow-Up: At [facility], you and your health needs are our priority.  As part of our continuing mission to provide you with exceptional heart care, we have created designated Provider Care Teams.  These Care Teams include your primary Cardiologist (physician) and Advanced Practice Providers (APPs -  Physician Assistants and Nurse Practitioners) who all work together to provide you with the care you need, when you need it.  We recommend signing up for the patient portal called "MyChart".  Sign up information is provided on this After Visit Summary.  MyChart is used to connect with patients for Virtual Visits (Telemedicine).  Patients are able to view lab/test results, encounter notes, upcoming appointments, etc.  Non-urgent messages can be sent to your provider as well.   To learn more about what you can do with MyChart, go to [email or web address].    Your next appointment:   3 month(s)  The format for your next appointment:   In Person  Provider:   [name], MD   Other Instructions N/A

## 2019-12-17 DIAGNOSIS — M62838 Other muscle spasm: Secondary | ICD-10-CM | POA: Diagnosis not present

## 2019-12-17 DIAGNOSIS — G35 Multiple sclerosis: Secondary | ICD-10-CM | POA: Diagnosis not present

## 2019-12-17 DIAGNOSIS — Z79899 Other long term (current) drug therapy: Secondary | ICD-10-CM | POA: Diagnosis not present

## 2019-12-17 DIAGNOSIS — R2681 Unsteadiness on feet: Secondary | ICD-10-CM | POA: Diagnosis not present

## 2020-01-06 DIAGNOSIS — E0500 Thyrotoxicosis with diffuse goiter without thyrotoxic crisis or storm: Secondary | ICD-10-CM | POA: Diagnosis not present

## 2020-01-13 DIAGNOSIS — Z0000 Encounter for general adult medical examination without abnormal findings: Secondary | ICD-10-CM | POA: Diagnosis not present

## 2020-01-13 DIAGNOSIS — E042 Nontoxic multinodular goiter: Secondary | ICD-10-CM | POA: Diagnosis not present

## 2020-01-13 DIAGNOSIS — E0500 Thyrotoxicosis with diffuse goiter without thyrotoxic crisis or storm: Secondary | ICD-10-CM | POA: Diagnosis not present

## 2020-01-13 DIAGNOSIS — Z1389 Encounter for screening for other disorder: Secondary | ICD-10-CM | POA: Diagnosis not present

## 2020-01-13 DIAGNOSIS — G35 Multiple sclerosis: Secondary | ICD-10-CM | POA: Diagnosis not present

## 2020-01-13 DIAGNOSIS — Z125 Encounter for screening for malignant neoplasm of prostate: Secondary | ICD-10-CM | POA: Diagnosis not present

## 2020-01-13 DIAGNOSIS — D6869 Other thrombophilia: Secondary | ICD-10-CM | POA: Diagnosis not present

## 2020-01-13 DIAGNOSIS — E781 Pure hyperglyceridemia: Secondary | ICD-10-CM | POA: Diagnosis not present

## 2020-01-13 DIAGNOSIS — I4891 Unspecified atrial fibrillation: Secondary | ICD-10-CM | POA: Diagnosis not present

## 2020-02-15 ENCOUNTER — Telehealth: Payer: Self-pay | Admitting: Cardiology

## 2020-02-15 DIAGNOSIS — I4891 Unspecified atrial fibrillation: Secondary | ICD-10-CM

## 2020-02-15 MED ORDER — PROPRANOLOL HCL ER 80 MG PO CP24: 80.0000 mg | ORAL_CAPSULE | Freq: Two times a day (BID) | ORAL | 3 refills | Status: DC

## 2020-02-15 NOTE — Telephone Encounter (Signed)
Spoke with patient and he stated that his SOB started on the onset of the weekend. On Sat 8/21 his Heart rate was in the 160's so he called our office and spoke with a male on call provider. He was instructed to take an extra propanolol, which he did on Sat and Sun. His Heart rate was 161 today, and he is still SOB while sitting in his chair.  I then spoke with Dr. [name], and after reviewing patients chart he ordered a Zio monitor for 1 week which I will have sent to the patient. He also ordered patient to increase his propranolol ER (INDERAL LA) 80 MG 24 hr capsule to BID. He advised that if the patients heart rate remains elevated or if his SOB persists or worsens, he go to the ER for evaluation.   I called patient back and relayed Dr. Eddie North advice as noted above. I also encouraged to stay hydrated and recommended buying a pulse oximeter if able, to monitor is O2 Sats.   Patient verbalized understanding and agreed with plan.

## 2020-02-15 NOTE — Telephone Encounter (Addendum)
STAT if HR is under 50 or over 120 (normal HR is 60-100 beats per minute)  1) What is your heart rate?  161   2) Do you have a log of your heart rate readings (document readings)?  No    bp 147/96   3) Do you have any other symptoms? Sob    Positive covid test 8/17      Please advise

## 2020-02-17 ENCOUNTER — Ambulatory Visit (INDEPENDENT_AMBULATORY_CARE_PROVIDER_SITE_OTHER): Payer: Medicare PPO

## 2020-02-17 DIAGNOSIS — I4891 Unspecified atrial fibrillation: Secondary | ICD-10-CM

## 2020-02-19 ENCOUNTER — Telehealth: Payer: Self-pay | Admitting: Cardiology

## 2020-02-19 NOTE — Telephone Encounter (Signed)
Patient cannot remember how long he is supposed to wear his ZIO monitor. Please advise.

## 2020-02-19 NOTE — Telephone Encounter (Signed)
Returned patients call and informed him that we would like him to wear his Zio monitor for one week. He applied it on Wednesday night and has had some complications, due to sweating, with the monitor coming off. He has used some paper tape to secure it. He will mail into [employer] next Thursday after wearing for one week.

## 2020-03-15 DIAGNOSIS — R000 Tachycardia, unspecified: Secondary | ICD-10-CM | POA: Diagnosis not present

## 2020-03-17 ENCOUNTER — Other Ambulatory Visit: Payer: Self-pay

## 2020-03-18 ENCOUNTER — Other Ambulatory Visit: Payer: Self-pay

## 2020-03-18 ENCOUNTER — Encounter: Payer: Self-pay | Admitting: Cardiology

## 2020-03-18 ENCOUNTER — Ambulatory Visit: Payer: Medicare PPO | Admitting: Cardiology

## 2020-03-18 VITALS — BP 90/62 | HR 81 | Ht 74.0 in | Wt 242.4 lb

## 2020-03-18 DIAGNOSIS — I480 Paroxysmal atrial fibrillation: Secondary | ICD-10-CM | POA: Diagnosis not present

## 2020-03-18 DIAGNOSIS — E0590 Thyrotoxicosis, unspecified without thyrotoxic crisis or storm: Secondary | ICD-10-CM

## 2020-03-18 MED ORDER — DILTIAZEM HCL ER COATED BEADS 240 MG PO CP24: 240.0000 mg | ORAL_CAPSULE | Freq: Every day | ORAL | 5 refills | Status: DC

## 2020-03-18 NOTE — Progress Notes (Signed)
Cardiology Office Note:    Date:  03/18/2020   ID:  Dustin Gray, DOB April 07, 1956, MRN [id number]  PCP:  [name], MD  Cardiologist:  [name], MD  Electrophysiologist:  None   Referring MD: [name], MD   Chief Complaint  Patient presents with  . other    3 month follow up. Meds reviewed by the pt. verbally. Pt. c/o LE edema.     History of Present Illness:    Dustin Gray is a 64 y.o. male with a hx of paroxysmal atrial fibrillation, multiple sclerosis, lymphedema, hypothyroidism/Graves' disease, who presents for follow-up.    Patient being seen for paroxysmal atrial fibrillation.  He was diagnosed with COVID-19 after describing symptoms of fevers, fatigue, loss of taste.  He received both Covid vaccine earlier in the year.  He noticed tachycardia while he had Covid symptoms.  Cardiac monitor which he wore for 6 to 7 days was placed as patient noticed symptoms of palpitations and increased heart rates.  He called the office and his Inderal was increased to twice daily.  He currently states feeling better with regards to symptoms of Covid, denies palpitations or any concerns at this time.  He is titrating his methimazole as per endocrinology recommendations.   Prior notes Patient originally seen in the hospital for atrial fibrillation with rapid ventricular response.  He was managed with diltiazem and Eliquis.  He was subsequently diagnosed with hyperthyroidism and propanolol ER added to his medical regimen.  Echocardiogram on 09/2019 showed normal ejection fraction with EF 55 to 60%, left atrial size was also normal.  He was started on anticoagulation due to plan for cardioversion.  Cardioversion was attempted but unsuccessful.  His heart rates have been improved since hyperthyroidism have been managed.  Past Medical History:  Diagnosis Date  . Abscess    groin  . Arthritis    lower spine  . Erectile dysfunction   . Low testosterone   . Lumbar  herniated disc    L5  . Multiple sclerosis ([place])   . Staph aureus infection     Past Surgical History:  Procedure Laterality Date  . COLONOSCOPY WITH PROPOFOL N/A 07/04/2016   Procedure: COLONOSCOPY WITH PROPOFOL;  Surgeon: [name], MD;  Location: [place];  Service: Endoscopy;  Laterality: N/A;  . [place]  . MASS EXCISION  1960  . POLYPECTOMY  07/04/2016   Procedure: POLYPECTOMY;  Surgeon: [name], MD;  Location: [facility];  Service: Endoscopy;;  . TONSILLECTOMY      Current Medications: Current Meds  Medication Sig  . apixaban (ELIQUIS) 5 MG TABS tablet Take 1 tablet (5 mg total) by mouth 2 (two) times daily.  . baclofen (LIORESAL) 10 MG tablet Take 10 mg by mouth 3 (three) times daily.    Marland Kitchen diltiazem (CARDIZEM CD) 240 MG 24 hr capsule Take 1 capsule (240 mg total) by mouth daily.  . Dimethyl Fumarate (TECFIDERA) 240 MG CPDR Take 240 mg by mouth 2 (two) times daily.   . furosemide (LASIX) 40 MG tablet Take 1 tablet (40 mg total) by mouth 2 (two) times daily.  Marland Kitchen gabapentin (NEURONTIN) 300 MG capsule Take 300-600 mg by mouth See admin instructions. Take 1 capsule (300 mg) by mouth in the morning, take 1 capsule (300 mg) by mouth at midday, & take 2 capsules (600 mg) by mouth at bedtime.  . meloxicam (MOBIC) 15 MG tablet Take 15 mg by mouth daily as needed for  pain.   . methimazole (TAPAZOLE) 10 MG tablet Take 3 tablets (30 mg total) by mouth daily. (Patient taking differently: Take 10 mg by mouth daily. )  . methimazole (TAPAZOLE) 5 MG tablet Take 2.5 mg by mouth daily.   . potassium chloride (KLOR-CON) 10 MEQ tablet Take 1 tablet (10 mEq total) by mouth daily.  . propranolol ER (INDERAL LA) 80 MG 24 hr capsule Take 1 capsule (80 mg total) by mouth 2 (two) times daily.  . [DISCONTINUED] diltiazem (CARDIZEM CD) 360 MG 24 hr capsule Take 1 capsule (360 mg total) by mouth daily.     Allergies:   Cephalexin, Ancef [cefazolin sodium], Cefadroxil,  and Cefazolin   Social History   Socioeconomic History  . Marital status: Legally Separated    Spouse name: Not on file  . Number of children: Not on file  . Years of education: Not on file  . Highest education level: Not on file  Occupational History  . Not on file  Tobacco Use  . Smoking status: Former Research scientist (life sciences)  . Smokeless tobacco: Never Used  . Tobacco comment: smoked as teenager  Substance and Sexual Activity  . Alcohol use: Yes    Comment: 2 drinks/month  . Drug use: No  . Sexual activity: Not on file  Other Topics Concern  . Not on file  Social History Narrative  . Not on file   Social Determinants of Health   Financial Resource Strain:   . Difficulty of Paying Living Expenses: Not on file  Food Insecurity:   . Worried About Charity fundraiser in the Last Year: Not on file  . Ran Out of Food in the Last Year: Not on file  Transportation Needs:   . Lack of Transportation (Medical): Not on file  . Lack of Transportation (Non-Medical): Not on file  Physical Activity:   . Days of Exercise per Week: Not on file  . Minutes of Exercise per Session: Not on file  Stress:   . Feeling of Stress : Not on file  Social Connections:   . Frequency of Communication with Friends and Family: Not on file  . Frequency of Social Gatherings with Friends and Family: Not on file  . Attends Religious Services: Not on file  . Active Member of Clubs or Organizations: Not on file  . Attends Archivist Meetings: Not on file  . Marital Status: Not on file     Family History: The patient's family history includes Diabetes in his father.  ROS:   Please see the history of present illness.     All other systems reviewed and are negative.  EKGs/Labs/Other Studies Reviewed:    The following studies were reviewed today:   EKG:  EKG is  ordered today.  The ekg ordered today demonstrates normal sinus rhythm, heart rate 81  Recent Labs: 09/30/2019: B Natriuretic Peptide 200.0;  TSH <0.010 10/01/2019: Magnesium 2.0 10/02/2019: ALT 26; Hemoglobin 14.0; Platelets 241 10/03/2019: BUN 17; Creatinine, Ser 1.01; Potassium 4.1; Sodium 135  Recent Lipid Panel    Component Value Date/Time   CHOL 108 10/01/2019 0358   TRIG 81 10/01/2019 0358   HDL 35 (L) 10/01/2019 0358   CHOLHDL 3.1 10/01/2019 0358   VLDL 16 10/01/2019 0358   LDLCALC 57 10/01/2019 0358    Physical Exam:    VS:  BP 90/62 (BP Location: Right Arm, Patient Position: Sitting, Cuff Size: Large)   Pulse 81   Ht 6\' 2"  (1.88 m)  Wt 242 lb 6 oz (109.9 kg)   SpO2 98%   BMI 31.12 kg/m     Wt Readings from Last 3 Encounters:  03/18/20 242 lb 6 oz (109.9 kg)  12/10/19 261 lb (118.4 kg)  10/28/19 266 lb (120.7 kg)     GEN:  Well nourished, well developed in no acute distress HEENT: Normal NECK: No JVD; No carotid bruits LYMPHATICS: No lymphadenopathy CARDIAC: Irregular irregular, no murmurs, rubs, gallops RESPIRATORY:  Clear to auscultation without rales, wheezing or rhonchi  ABDOMEN: Soft, non-tender, non-distended MUSCULOSKELETAL:  trace edema; No deformity  SKIN: Warm and dry NEUROLOGIC:  Alert and oriented x 3 PSYCHIATRIC:  Normal affect   ASSESSMENT:    1. Paroxysmal A-fib ([place])   2. Hyperthyroidism    PLAN:    In order of problems listed above:  1. Patient with history of paroxysmal atrial fibrillation.  Previously failed cardioversion.  Cardiac monitor worn from 02/17/2020 - 02/25/20 showed persistent atrial fibrillation.  A. fib likely driven by Covid infection versus hyperthyroidism.  EKG today shows normal sinus rhythm, heart rate 81.  Blood pressure is low normal with systolic of 90 today.  Decrease Cardizem to 240 mg daily.  Continue Inderal LA  80 mg twice daily.  Continue Eliquis 2. History of hypothyroidism.  On methimazole.  Titrations and management as per endocrinology.  Follow-up in 3 months  Total encounter time 40 minutes  Greater than 50% was spent in counseling and  coordination of care with the patient   This note was generated in part or whole with voice recognition software. Voice recognition is usually quite accurate but there are transcription errors that can and very often do occur. I apologize for any typographical errors that were not detected and corrected.  Medication Adjustments/Labs and Tests Ordered: Current medicines are reviewed at length with the patient today.  Concerns regarding medicines are outlined above.  Orders Placed This Encounter  Procedures  . EKG 12-Lead   Meds ordered this encounter  Medications  . diltiazem (CARDIZEM CD) 240 MG 24 hr capsule    Sig: Take 1 capsule (240 mg total) by mouth daily.    Dispense:  30 capsule    Refill:  5    Patient Instructions  Medication Instructions:   Your physician has recommended you make the following change in your medication:   DECREASE diltiazem (CARDIZEM CD) to 240 MG 24 hr capsule: Take 1 capsule (240 mg total) by mouth daily.  *If you need a refill on your cardiac medications before your next appointment, please call your pharmacy*   Lab Work: None Ordered If you have labs (blood work) drawn today and your tests are completely normal, you will receive your results only by: Marland Kitchen MyChart Message (if you have MyChart) OR . A paper copy in the mail If you have any lab test that is abnormal or we need to change your treatment, we will call you to review the results.   Testing/Procedures: None Ordered   Follow-Up: At [facility], you and your health needs are our priority.  As part of our continuing mission to provide you with exceptional heart care, we have created designated Provider Care Teams.  These Care Teams include your primary Cardiologist (physician) and Advanced Practice Providers (APPs -  Physician Assistants and Nurse Practitioners) who all work together to provide you with the care you need, when you need it.  We recommend signing up for the patient portal  called "MyChart".  Sign up information  is provided on this After Visit Summary.  MyChart is used to connect with patients for Virtual Visits (Telemedicine).  Patients are able to view lab/test results, encounter notes, upcoming appointments, etc.  Non-urgent messages can be sent to your provider as well.   To learn more about what you can do with MyChart, go to [email or web address].    Your next appointment:   3 month(s)  The format for your next appointment:   In Person  Provider:   [name], MD   Other Instructions      Signed, [name], MD  03/18/2020 12:37 PM    [place]

## 2020-03-18 NOTE — Patient Instructions (Signed)
Medication Instructions:   Your physician has recommended you make the following change in your medication:   DECREASE diltiazem (CARDIZEM CD) to 240 MG 24 hr capsule: Take 1 capsule (240 mg total) by mouth daily.  *If you need a refill on your cardiac medications before your next appointment, please call your pharmacy*   Lab Work: None Ordered If you have labs (blood work) drawn today and your tests are completely normal, you will receive your results only by:  [place] (if you have MyChart) OR  A paper copy in the mail If you have any lab test that is abnormal or we need to change your treatment, we will call you to review the results.   Testing/Procedures: None Ordered   Follow-Up: At [facility], you and your health needs are our priority.  As part of our continuing mission to provide you with exceptional heart care, we have created designated Provider Care Teams.  These Care Teams include your primary Cardiologist (physician) and Advanced Practice Providers (APPs -  Physician Assistants and Nurse Practitioners) who all work together to provide you with the care you need, when you need it.  We recommend signing up for the patient portal called "MyChart".  Sign up information is provided on this After Visit Summary.  MyChart is used to connect with patients for Virtual Visits (Telemedicine).  Patients are able to view lab/test results, encounter notes, upcoming appointments, etc.  Non-urgent messages can be sent to your provider as well.   To learn more about what you can do with MyChart, go to [email or web address].    Your next appointment:   3 month(s)  The format for your next appointment:   In Person  Provider:   [name], MD   Other Instructions

## 2020-04-15 DIAGNOSIS — E042 Nontoxic multinodular goiter: Secondary | ICD-10-CM | POA: Diagnosis not present

## 2020-04-15 DIAGNOSIS — E0500 Thyrotoxicosis with diffuse goiter without thyrotoxic crisis or storm: Secondary | ICD-10-CM | POA: Diagnosis not present

## 2020-04-22 DIAGNOSIS — E0500 Thyrotoxicosis with diffuse goiter without thyrotoxic crisis or storm: Secondary | ICD-10-CM | POA: Diagnosis not present

## 2020-04-22 DIAGNOSIS — E042 Nontoxic multinodular goiter: Secondary | ICD-10-CM | POA: Diagnosis not present

## 2020-05-04 ENCOUNTER — Other Ambulatory Visit: Payer: Self-pay | Admitting: Cardiology

## 2020-05-04 MED ORDER — APIXABAN 5 MG PO TABS: 5.0000 mg | ORAL_TABLET | Freq: Two times a day (BID) | ORAL | 1 refills | Status: DC

## 2020-05-04 NOTE — Telephone Encounter (Signed)
Please review for refill, Thanks !  

## 2020-05-04 NOTE — Telephone Encounter (Signed)
Pt's age 64, wt 109.9 kg, SCr 1.01, CrCl 116.37, last ov w/ BA 03/18/20.

## 2020-05-04 NOTE — Telephone Encounter (Signed)
°*  STAT* If patient is at the pharmacy, call can be transferred to refill team.   1. Which medications need to be refilled? (please list name of each medication and dose if known) Eliquis 5 mg bid  2. Which pharmacy/location (including street and city if local pharmacy) is medication to be sent to? [facility] on [address]  3. Do they need a 30 day or 90 day supply? [place]

## 2020-06-16 DIAGNOSIS — Z79899 Other long term (current) drug therapy: Secondary | ICD-10-CM | POA: Diagnosis not present

## 2020-06-16 DIAGNOSIS — M62838 Other muscle spasm: Secondary | ICD-10-CM | POA: Diagnosis not present

## 2020-06-16 DIAGNOSIS — G35 Multiple sclerosis: Secondary | ICD-10-CM | POA: Diagnosis not present

## 2020-06-20 ENCOUNTER — Encounter: Payer: Self-pay | Admitting: Cardiology

## 2020-06-20 ENCOUNTER — Ambulatory Visit: Payer: Medicare PPO | Admitting: Cardiology

## 2020-06-20 ENCOUNTER — Other Ambulatory Visit: Payer: Self-pay

## 2020-06-20 VITALS — BP 110/68 | HR 84 | Ht 74.0 in | Wt 238.2 lb

## 2020-06-20 DIAGNOSIS — I480 Paroxysmal atrial fibrillation: Secondary | ICD-10-CM

## 2020-06-20 MED ORDER — FUROSEMIDE 40 MG PO TABS: 40.0000 mg | ORAL_TABLET | ORAL | 11 refills | Status: DC | PRN

## 2020-06-20 MED ORDER — POTASSIUM CHLORIDE ER 10 MEQ PO TBCR: 10.0000 meq | EXTENDED_RELEASE_TABLET | ORAL | 3 refills | Status: DC | PRN

## 2020-06-20 NOTE — Patient Instructions (Signed)
Medication Instructions:  Your physician has recommended you make the following change in your medication:   Change your Lasix and potassium to take only as needed.  *If you need a refill on your cardiac medications before your next appointment, please call your pharmacy*   Lab Work: None Ordered If you have labs (blood work) drawn today and your tests are completely normal, you will receive your results only by: Marland Kitchen MyChart Message (if you have MyChart) OR . A paper copy in the mail If you have any lab test that is abnormal or we need to change your treatment, we will call you to review the results.   Testing/Procedures: None Ordered   Follow-Up: At [facility], you and your health needs are our priority.  As part of our continuing mission to provide you with exceptional heart care, we have created designated Provider Care Teams.  These Care Teams include your primary Cardiologist (physician) and Advanced Practice Providers (APPs -  Physician Assistants and Nurse Practitioners) who all work together to provide you with the care you need, when you need it.  We recommend signing up for the patient portal called "MyChart".  Sign up information is provided on this After Visit Summary.  MyChart is used to connect with patients for Virtual Visits (Telemedicine).  Patients are able to view lab/test results, encounter notes, upcoming appointments, etc.  Non-urgent messages can be sent to your provider as well.   To learn more about what you can do with MyChart, go to [email or web address].    Your next appointment:   6 month(s)  The format for your next appointment:   In Person  Provider:   Debbe Odea, MD   Other Instructions

## 2020-06-20 NOTE — Progress Notes (Signed)
Cardiology Office Note:    Date:  06/20/2020   ID:  Dustin Gray, DOB [birth year], MRN [id number]  PCP:  Dustin Arabian, MD  Cardiologist:  Dustin Sable, MD  Electrophysiologist:  None   Referring MD: Dustin Arabian, MD   Chief Complaint  Patient presents with  . Other    3 month f/u no complaints today. Meds reviewed verbally with pt.    History of Present Illness:    Dustin Gray is a 64 y.o. male with a hx of paroxysmal atrial fibrillation, multiple sclerosis, lymphedema, hypothyroidism/Graves' disease, who presents for follow-up.    Patient being seen for paroxysmal atrial fibrillation.  Last seen for medication titration due to low blood pressures.  Cardizem was decreased to 240 mg daily.  Inderal was continued.  He states his blood pressure is better, denies palpitations, heart rate is being controlled.  Has no issues at this time.  Compliant with all his current medications.   Prior notes Patient originally seen in the hospital for atrial fibrillation with rapid ventricular response.  He was managed with diltiazem and Eliquis.  He was subsequently diagnosed with hyperthyroidism and propanolol ER added to his medical regimen.  Echocardiogram on 09/2019 showed normal ejection fraction with EF 55 to 60%, left atrial size was also normal.  He was started on anticoagulation due to plan for cardioversion.  Cardioversion was attempted but unsuccessful.  His heart rates have been improved since hyperthyroidism have been managed.  Past Medical History:  Diagnosis Date  . Abscess    groin  . Arthritis    lower spine  . Erectile dysfunction   . Low testosterone   . Lumbar herniated disc    L5  . Multiple sclerosis ([place])   . Staph aureus infection     Past Surgical History:  Procedure Laterality Date  . COLONOSCOPY WITH PROPOFOL N/A 07/04/2016   Procedure: COLONOSCOPY WITH PROPOFOL;  Surgeon: [name], MD;  Location: [place];  Service: Endoscopy;   Laterality: N/A;  . [place]  . MASS EXCISION  1960  . POLYPECTOMY  07/04/2016   Procedure: POLYPECTOMY;  Surgeon: [name], MD;  Location: [facility];  Service: Endoscopy;;  . TONSILLECTOMY      Current Medications: Current Meds  Medication Sig  . apixaban (ELIQUIS) 5 MG TABS tablet Take 1 tablet (5 mg total) by mouth 2 (two) times daily.  . baclofen (LIORESAL) 10 MG tablet Take 10 mg by mouth 3 (three) times daily.  Marland Kitchen diltiazem (CARDIZEM CD) 240 MG 24 hr capsule Take 1 capsule (240 mg total) by mouth daily.  . Dimethyl Fumarate 240 MG CPDR Take 240 mg by mouth 2 (two) times daily.   Marland Kitchen gabapentin (NEURONTIN) 300 MG capsule Take 300-600 mg by mouth See admin instructions. Take 1 capsule (300 mg) by mouth in the morning, take 1 capsule (300 mg) by mouth at midday, & take 2 capsules (600 mg) by mouth at bedtime.  . meloxicam (MOBIC) 15 MG tablet Take 15 mg by mouth daily as needed for pain.   . methimazole (TAPAZOLE) 5 MG tablet Take 2.5 mg by mouth daily.   . propranolol ER (INDERAL LA) 80 MG 24 hr capsule Take 1 capsule (80 mg total) by mouth 2 (two) times daily.  . [DISCONTINUED] furosemide (LASIX) 40 MG tablet Take 1 tablet (40 mg total) by mouth 2 (two) times daily.  . [DISCONTINUED] potassium chloride (KLOR-CON) 10 MEQ tablet Take 1 tablet (10 mEq total)  by mouth daily.     Allergies:   Cephalexin, Ancef [cefazolin sodium], Cefadroxil, and Cefazolin   Social History   Socioeconomic History  . Marital status: Legally Separated    Spouse name: Not on file  . Number of children: Not on file  . Years of education: Not on file  . Highest education level: Not on file  Occupational History  . Not on file  Tobacco Use  . Smoking status: Former Games developer  . Smokeless tobacco: Never Used  . Tobacco comment: smoked as teenager  Substance and Sexual Activity  . Alcohol use: Yes    Comment: 2 drinks/month  . Drug use: No  . Sexual activity: Not on file  Other  Topics Concern  . Not on file  Social History Narrative  . Not on file   Social Determinants of Health   Financial Resource Strain: Not on file  Food Insecurity: Not on file  Transportation Needs: Not on file  Physical Activity: Not on file  Stress: Not on file  Social Connections: Not on file     Family History: The patient's family history includes Diabetes in his father.  ROS:   Please see the history of present illness.     All other systems reviewed and are negative.  EKGs/Labs/Other Studies Reviewed:    The following studies were reviewed today:   EKG:  EKG is  ordered today.  The ekg ordered today demonstrates normal sinus rhythm, heart rate 84  Recent Labs: 09/30/2019: B Natriuretic Peptide 200.0; TSH <0.010 10/01/2019: Magnesium 2.0 10/02/2019: ALT 26; Hemoglobin 14.0; Platelets 241 10/03/2019: BUN 17; Creatinine, Ser 1.01; Potassium 4.1; Sodium 135  Recent Lipid Panel    Component Value Date/Time   CHOL 108 10/01/2019 0358   TRIG 81 10/01/2019 0358   HDL 35 (L) 10/01/2019 0358   CHOLHDL 3.1 10/01/2019 0358   VLDL 16 10/01/2019 0358   LDLCALC 57 10/01/2019 0358    Physical Exam:    VS:  BP 110/68 (BP Location: Left Arm, Patient Position: Sitting, Cuff Size: Normal)   Pulse 84   Ht 6\' 2"  (1.88 m)   Wt 238 lb 4 oz (108.1 kg)   SpO2 98%   BMI 30.59 kg/m     Wt Readings from Last 3 Encounters:  06/20/20 238 lb 4 oz (108.1 kg)  03/18/20 242 lb 6 oz (109.9 kg)  12/10/19 261 lb (118.4 kg)     GEN:  Well nourished, well developed in no acute distress HEENT: Normal NECK: No JVD; No carotid bruits LYMPHATICS: No lymphadenopathy CARDIAC: Irregular irregular, no murmurs, rubs, gallops RESPIRATORY:  Clear to auscultation without rales, wheezing or rhonchi  ABDOMEN: Soft, non-tender, non-distended MUSCULOSKELETAL:  trace edema; No deformity  SKIN: Warm and dry NEUROLOGIC:  Alert and oriented x 3 PSYCHIATRIC:  Normal affect   ASSESSMENT:    1. Paroxysmal  A-fib ([facility])    PLAN:    In order of problems listed above:  1. Patient with history of paroxysmal atrial fibrillation.  Likely driven by hyperthyroidism.  CHA2DS2-VASc score 0.  Heart rate controlled on current doses of Cardizem and Inderal LA.  Currently in sinus rhythm.  Continue Cardizem to 240 mg daily, Inderal LA  80 mg twice daily.  Continue Eliquis for now.  Plan to taper down Cardizem if thyroid function is adequately controlled at follow-up visit.  Once euthyroid he is demonstrated, sings patient has CHA2DS2-VASc of 0, some studies have shown low risk of ischemic stroke is very low.  May consider stopping long-term anticoagulation with Eliquis if euthyroid state is demonstrated and no recurrence of A. fib shown on monitor. 2. hyperthyroidism.  On methimazole. management as per endocrinology.  Follow-up in 6 months  Total encounter time 40 minutes  Greater than 50% was spent in counseling and coordination of care with the patient   This note was generated in part or whole with voice recognition software. Voice recognition is usually quite accurate but there are transcription errors that can and very often do occur. I apologize for any typographical errors that were not detected and corrected.  Medication Adjustments/Labs and Tests Ordered: Current medicines are reviewed at length with the patient today.  Concerns regarding medicines are outlined above.  Orders Placed This Encounter  Procedures  . EKG 12-Lead   Meds ordered this encounter  Medications  . furosemide (LASIX) 40 MG tablet    Sig: Take 1 tablet (40 mg total) by mouth as needed. With your potassium.    Dispense:  60 tablet    Refill:  11  . potassium chloride (KLOR-CON) 10 MEQ tablet    Sig: Take 1 tablet (10 mEq total) by mouth as needed. Take with your Lasix.    Dispense:  90 tablet    Refill:  3    Patient Instructions  Medication Instructions:  Your physician has recommended you make the following change in  your medication:   Change your Lasix and potassium to take only as needed.  *If you need a refill on your cardiac medications before your next appointment, please call your pharmacy*   Lab Work: None Ordered If you have labs (blood work) drawn today and your tests are completely normal, you will receive your results only by: Marland Kitchen MyChart Message (if you have MyChart) OR . A paper copy in the mail If you have any lab test that is abnormal or we need to change your treatment, we will call you to review the results.   Testing/Procedures: None Ordered   Follow-Up: At [facility], you and your health needs are our priority.  As part of our continuing mission to provide you with exceptional heart care, we have created designated Provider Care Teams.  These Care Teams include your primary Cardiologist (physician) and Advanced Practice Providers (APPs -  Physician Assistants and Nurse Practitioners) who all work together to provide you with the care you need, when you need it.  We recommend signing up for the patient portal called "MyChart".  Sign up information is provided on this After Visit Summary.  MyChart is used to connect with patients for Virtual Visits (Telemedicine).  Patients are able to view lab/test results, encounter notes, upcoming appointments, etc.  Non-urgent messages can be sent to your provider as well.   To learn more about what you can do with MyChart, go to [email or web address].    Your next appointment:   6 month(s)  The format for your next appointment:   In Person  Provider:   [name], MD   Other Instructions      Signed, Dustin Sable, MD  06/20/2020 12:59 PM    [place]

## 2020-08-16 DIAGNOSIS — E042 Nontoxic multinodular goiter: Secondary | ICD-10-CM | POA: Diagnosis not present

## 2020-08-16 DIAGNOSIS — E0500 Thyrotoxicosis with diffuse goiter without thyrotoxic crisis or storm: Secondary | ICD-10-CM | POA: Diagnosis not present

## 2020-08-23 DIAGNOSIS — I4891 Unspecified atrial fibrillation: Secondary | ICD-10-CM | POA: Diagnosis not present

## 2020-08-23 DIAGNOSIS — E042 Nontoxic multinodular goiter: Secondary | ICD-10-CM | POA: Diagnosis not present

## 2020-08-23 DIAGNOSIS — E0500 Thyrotoxicosis with diffuse goiter without thyrotoxic crisis or storm: Secondary | ICD-10-CM | POA: Diagnosis not present

## 2020-10-03 ENCOUNTER — Telehealth: Payer: Self-pay | Admitting: Cardiology

## 2020-10-03 MED ORDER — DILTIAZEM HCL ER COATED BEADS 240 MG PO CP24: 240.0000 mg | ORAL_CAPSULE | Freq: Every day | ORAL | 5 refills | Status: DC

## 2020-10-03 NOTE — Telephone Encounter (Signed)
diltiazem (CARDIZEM CD) 240 MG 24 hr capsule [[id number]]   Order Details Dose: 240 mg Route: Oral Frequency: Daily  Dispense Quantity: 30 capsule Refills: 5       Sig: Take 1 capsule (240 mg total) by mouth daily.      Start Date: 10/03/20 End Date: --  Written Date: 10/03/20 Expiration Date: 10/03/21  Original Order:  diltiazem (CARDIZEM CD) 240 MG 24 hr capsule [[id number]]   Providers  Ordering and Authorizing Provider:   [name], MD  [address] [place], [place] [address]  Phone:  [phone]  Fax:  [phone]  DEA #:  [id number]  NPI:  [id number]     Ordering User:  Parlee Amescua, [place] [address] [place]  [place], [place] [address]  Phone:  [phone] Fax:  [phone]  DEA #:  --  DAW Reason: --

## 2020-10-25 DIAGNOSIS — E0500 Thyrotoxicosis with diffuse goiter without thyrotoxic crisis or storm: Secondary | ICD-10-CM | POA: Diagnosis not present

## 2020-10-31 ENCOUNTER — Other Ambulatory Visit: Payer: Self-pay

## 2020-10-31 MED ORDER — APIXABAN 5 MG PO TABS: 5.0000 mg | ORAL_TABLET | Freq: Two times a day (BID) | ORAL | 1 refills | Status: DC

## 2020-10-31 NOTE — Telephone Encounter (Signed)
Pt's age 66, wt 108.1 kg, SCr 1.04, CrCl 109.72, last ov w/ BA 05/31/20.

## 2020-11-01 DIAGNOSIS — E042 Nontoxic multinodular goiter: Secondary | ICD-10-CM | POA: Diagnosis not present

## 2020-11-01 DIAGNOSIS — E0500 Thyrotoxicosis with diffuse goiter without thyrotoxic crisis or storm: Secondary | ICD-10-CM | POA: Diagnosis not present

## 2020-11-01 DIAGNOSIS — I4891 Unspecified atrial fibrillation: Secondary | ICD-10-CM | POA: Diagnosis not present

## 2020-12-08 ENCOUNTER — Other Ambulatory Visit: Payer: Self-pay

## 2020-12-08 MED ORDER — PROPRANOLOL HCL ER 80 MG PO CP24: 80.0000 mg | ORAL_CAPSULE | Freq: Two times a day (BID) | ORAL | 0 refills | Status: DC

## 2020-12-19 ENCOUNTER — Encounter: Payer: Self-pay | Admitting: Cardiology

## 2020-12-19 ENCOUNTER — Other Ambulatory Visit: Payer: Self-pay

## 2020-12-19 ENCOUNTER — Ambulatory Visit (INDEPENDENT_AMBULATORY_CARE_PROVIDER_SITE_OTHER): Payer: Medicare PPO | Admitting: Cardiology

## 2020-12-19 VITALS — BP 110/72 | HR 138 | Ht 74.0 in | Wt 236.0 lb

## 2020-12-19 DIAGNOSIS — I480 Paroxysmal atrial fibrillation: Secondary | ICD-10-CM

## 2020-12-19 DIAGNOSIS — G35 Multiple sclerosis: Secondary | ICD-10-CM | POA: Diagnosis not present

## 2020-12-19 DIAGNOSIS — E0590 Thyrotoxicosis, unspecified without thyrotoxic crisis or storm: Secondary | ICD-10-CM | POA: Diagnosis not present

## 2020-12-19 DIAGNOSIS — M62838 Other muscle spasm: Secondary | ICD-10-CM | POA: Diagnosis not present

## 2020-12-19 DIAGNOSIS — Z79899 Other long term (current) drug therapy: Secondary | ICD-10-CM | POA: Diagnosis not present

## 2020-12-19 DIAGNOSIS — R2681 Unsteadiness on feet: Secondary | ICD-10-CM | POA: Diagnosis not present

## 2020-12-19 MED ORDER — DILTIAZEM HCL ER 120 MG PO CP12: 120.0000 mg | ORAL_CAPSULE | Freq: Two times a day (BID) | ORAL | 5 refills | Status: DC

## 2020-12-19 NOTE — Progress Notes (Signed)
Cardiology Office Note:    Date:  12/19/2020   ID:  Dustin Gray, DOB [birth year], MRN [id number]  PCP:  Dustin Arabian, MD  Cardiologist:  Dustin Sable, MD  Electrophysiologist:  None   Referring MD: Dustin Arabian, MD   Chief Complaint  Patient presents with   Other    6 month follow up. Patient c.o some SOB. Meds reviewed verbally with patient.     History of Present Illness:    Dustin Gray is a 65 y.o. male with a hx of paroxysmal atrial fibrillation, multiple sclerosis, lymphedema, hypothyroidism/Graves' disease, who presents for follow-up.    Patient being seen for paroxysmal atrial fibrillation.  Complains of shortness of breath and palpitations.  Endocrinology meds/methimazole for thyroid function was recently adjusted.  He states knowing he is currently in A. fib.  Thyroid radiation being considered.  Compliant with medications as prescribed.   Prior notes Patient originally seen in the hospital for atrial fibrillation with rapid ventricular response.  He was managed with diltiazem and Eliquis.  He was subsequently diagnosed with hyperthyroidism and propanolol ER added to his medical regimen.  Echocardiogram on 09/2019 showed normal ejection fraction with EF 55 to 60%, left atrial size was also normal.  He was started on anticoagulation due to plan for cardioversion.  Cardioversion was attempted but unsuccessful.  His heart rates have been improved since hyperthyroidism have been managed.  Past Medical History:  Diagnosis Date   Abscess    groin   Arthritis    lower spine   Erectile dysfunction    Low testosterone    Lumbar herniated disc    L5   Multiple sclerosis (HCC)    Staph aureus infection     Past Surgical History:  Procedure Laterality Date   COLONOSCOPY WITH PROPOFOL N/A 07/04/2016   Procedure: COLONOSCOPY WITH PROPOFOL;  Surgeon: Dustin Lame, MD;  Location: [facility];  Service: Endoscopy;  Laterality: N/A;   [place]   POLYPECTOMY  07/04/2016   Procedure: POLYPECTOMY;  Surgeon: Dustin Lame, MD;  Location: [facility];  Service: Endoscopy;;   TONSILLECTOMY      Current Medications: Current Meds  Medication Sig   apixaban (ELIQUIS) 5 MG TABS tablet Take 1 tablet (5 mg total) by mouth 2 (two) times daily.   baclofen (LIORESAL) 10 MG tablet Take 10 mg by mouth 3 (three) times daily.   diltiazem (CARDIZEM SR) 120 MG 12 hr capsule Take 1 capsule (120 mg total) by mouth 2 (two) times daily.   Dimethyl Fumarate 240 MG CPDR Take 240 mg by mouth 2 (two) times daily.    furosemide (LASIX) 40 MG tablet Take 1 tablet (40 mg total) by mouth as needed. With your potassium.   gabapentin (NEURONTIN) 300 MG capsule Take 300-600 mg by mouth See admin instructions. Take 1 capsule (300 mg) by mouth in the morning, take 1 capsule (300 mg) by mouth at midday, & take 2 capsules (600 mg) by mouth at bedtime.   meloxicam (MOBIC) 15 MG tablet Take 15 mg by mouth daily as needed for pain.    methimazole (TAPAZOLE) 5 MG tablet Take 2.5 mg by mouth daily.    potassium chloride (KLOR-CON) 10 MEQ tablet Take 1 tablet (10 mEq total) by mouth as needed. Take with your Lasix.   propranolol ER (INDERAL LA) 80 MG 24 hr capsule Take 1 capsule (80 mg total) by mouth 2 (two) times daily.   [  DISCONTINUED] diltiazem (CARDIZEM CD) 240 MG 24 hr capsule Take 1 capsule (240 mg total) by mouth daily.     Allergies:   Cephalexin, Ancef [cefazolin sodium], Cefadroxil, and Cefazolin   Social History   Socioeconomic History   Marital status: Legally Separated    Spouse name: Not on file   Number of children: Not on file   Years of education: Not on file   Highest education level: Not on file  Occupational History   Not on file  Tobacco Use   Smoking status: Former    Pack years: 0.00   Smokeless tobacco: Never   Tobacco comments:    smoked as teenager  Substance and Sexual Activity   Alcohol use: Yes     Comment: 2 drinks/month   Drug use: No   Sexual activity: Not on file  Other Topics Concern   Not on file  Social History Narrative   Not on file   Social Determinants of Health   Financial Resource Strain: Not on file  Food Insecurity: Not on file  Transportation Needs: Not on file  Physical Activity: Not on file  Stress: Not on file  Social Connections: Not on file     Family History: The patient's family history includes Diabetes in his father.  ROS:   Please see the history of present illness.     All other systems reviewed and are negative.  EKGs/Labs/Other Studies Reviewed:    The following studies were reviewed today:   EKG:  EKG is  ordered today.  The ekg ordered today demonstrates atrial fibrillation, RVR.  28  Recent Labs: No results found for requested labs within last 8760 hours.  Recent Lipid Panel    Component Value Date/Time   CHOL 108 10/01/2019 0358   TRIG 81 10/01/2019 0358   HDL 35 (L) 10/01/2019 0358   CHOLHDL 3.1 10/01/2019 0358   VLDL 16 10/01/2019 0358   LDLCALC 57 10/01/2019 0358    Physical Exam:    VS:  BP 110/72 (BP Location: Right Arm, Patient Position: Sitting, Cuff Size: Normal)   Pulse (!) 138   Ht 6\' 2"  (1.88 m)   Wt 236 lb (107 kg)   SpO2 96%   BMI 30.30 kg/m     Wt Readings from Last 3 Encounters:  12/19/20 236 lb (107 kg)  06/20/20 238 lb 4 oz (108.1 kg)  03/18/20 242 lb 6 oz (109.9 kg)     GEN:  Well nourished, well developed in no acute distress HEENT: Normal NECK: No JVD; No carotid bruits LYMPHATICS: No lymphadenopathy CARDIAC: Irregular irregular, tachycardic, no murmurs, rubs, gallops RESPIRATORY:  Clear to auscultation without rales, wheezing or rhonchi  ABDOMEN: Soft, non-tender, non-distended MUSCULOSKELETAL:  trace edema; No deformity  SKIN: Warm and dry NEUROLOGIC:  Alert and oriented x 3 PSYCHIATRIC:  Normal affect   ASSESSMENT:    1. Paroxysmal A-fib ([place])   2. Hyperthyroidism     PLAN:     In order of problems listed above:  Patient with history of paroxysmal atrial fibrillation.  Tachycardic today, EKG shows A. fib heart rates 130s, atrial fibrillation likely driven by hyperthyroidism.  CHA2DS2-VASc score 0.  Start Cardizem SR 180 milligrams twice daily continue Eliquis .  Plan to taper down Cardizem if thyroid function is adequately controlled at follow-up visit.  Once euthyroid he is demonstrated, sings patient has CHA2DS2-VASc of 0, some studies have shown low risk of ischemic stroke is very low.  May consider stopping long-term anticoagulation  with Eliquis if euthyroid state is demonstrated and no recurrence of A. fib shown on monitor. hyperthyroidism.  On methimazole. management as per endocrinology.  Follow-up in 6 weeks  Total encounter time 40 minutes  Greater than 50% was spent in counseling and coordination of care with the patient   This note was generated in part or whole with voice recognition software. Voice recognition is usually quite accurate but there are transcription errors that can and very often do occur. I apologize for any typographical errors that were not detected and corrected.  Medication Adjustments/Labs and Tests Ordered: Current medicines are reviewed at length with the patient today.  Concerns regarding medicines are outlined above.  Orders Placed This Encounter  Procedures   EKG 12-Lead    Meds ordered this encounter  Medications   diltiazem (CARDIZEM SR) 120 MG 12 hr capsule    Sig: Take 1 capsule (120 mg total) by mouth 2 (two) times daily.    Dispense:  60 capsule    Refill:  5     Patient Instructions  Medication Instructions:   Your physician has recommended you make the following change in your medication:   START taking Diltiazem (Cardizem SR) 120 MG twice a day.  STOP taking diltiazem (CARDIZEM CD) 240 MG  *If you need a refill on your cardiac medications before your next appointment, please call your  pharmacy*   Lab Work: None ordered If you have labs (blood work) drawn today and your tests are completely normal, you will receive your results only by: [place] (if you have MyChart) OR A paper copy in the mail If you have any lab test that is abnormal or we need to change your treatment, we will call you to review the results.   Testing/Procedures: None ordered   Follow-Up: At [facility], you and your health needs are our priority.  As part of our continuing mission to provide you with exceptional heart care, we have created designated Provider Care Teams.  These Care Teams include your primary Cardiologist (physician) and Advanced Practice Providers (APPs -  Physician Assistants and Nurse Practitioners) who all work together to provide you with the care you need, when you need it.  We recommend signing up for the patient portal called "MyChart".  Sign up information is provided on this After Visit Summary.  MyChart is used to connect with patients for Virtual Visits (Telemedicine).  Patients are able to view lab/test results, encounter notes, upcoming appointments, etc.  Non-urgent messages can be sent to your provider as well.   To learn more about what you can do with MyChart, go to [email or web address].    Your next appointment:   6 week(s)  The format for your next appointment:   In Person  Provider:   [name], MD  ONLY   Other Instructions     Signed, Dustin Sable, MD  12/19/2020 11:48 AM    [place]

## 2020-12-19 NOTE — Patient Instructions (Addendum)
Medication Instructions:   Your physician has recommended you make the following change in your medication:   START taking Diltiazem (Cardizem SR) 120 MG twice a day.  STOP taking diltiazem (CARDIZEM CD) 240 MG  *If you need a refill on your cardiac medications before your next appointment, please call your pharmacy*   Lab Work: None ordered If you have labs (blood work) drawn today and your tests are completely normal, you will receive your results only by: [place] (if you have MyChart) OR A paper copy in the mail If you have any lab test that is abnormal or we need to change your treatment, we will call you to review the results.   Testing/Procedures: None ordered   Follow-Up: At [facility], you and your health needs are our priority.  As part of our continuing mission to provide you with exceptional heart care, we have created designated Provider Care Teams.  These Care Teams include your primary Cardiologist (physician) and Advanced Practice Providers (APPs -  Physician Assistants and Nurse Practitioners) who all work together to provide you with the care you need, when you need it.  We recommend signing up for the patient portal called "MyChart".  Sign up information is provided on this After Visit Summary.  MyChart is used to connect with patients for Virtual Visits (Telemedicine).  Patients are able to view lab/test results, encounter notes, upcoming appointments, etc.  Non-urgent messages can be sent to your provider as well.   To learn more about what you can do with MyChart, go to [email or web address].    Your next appointment:   6 week(s)  The format for your next appointment:   In Person  Provider:   [name], MD  ONLY   Other Instructions

## 2021-01-03 DIAGNOSIS — E0500 Thyrotoxicosis with diffuse goiter without thyrotoxic crisis or storm: Secondary | ICD-10-CM | POA: Diagnosis not present

## 2021-01-10 DIAGNOSIS — E042 Nontoxic multinodular goiter: Secondary | ICD-10-CM | POA: Diagnosis not present

## 2021-01-10 DIAGNOSIS — I4891 Unspecified atrial fibrillation: Secondary | ICD-10-CM | POA: Diagnosis not present

## 2021-01-10 DIAGNOSIS — E0500 Thyrotoxicosis with diffuse goiter without thyrotoxic crisis or storm: Secondary | ICD-10-CM | POA: Diagnosis not present

## 2021-01-23 DIAGNOSIS — Z1389 Encounter for screening for other disorder: Secondary | ICD-10-CM | POA: Diagnosis not present

## 2021-01-23 DIAGNOSIS — G35 Multiple sclerosis: Secondary | ICD-10-CM | POA: Diagnosis not present

## 2021-01-23 DIAGNOSIS — I4891 Unspecified atrial fibrillation: Secondary | ICD-10-CM | POA: Diagnosis not present

## 2021-01-23 DIAGNOSIS — E0500 Thyrotoxicosis with diffuse goiter without thyrotoxic crisis or storm: Secondary | ICD-10-CM | POA: Diagnosis not present

## 2021-01-23 DIAGNOSIS — Z0000 Encounter for general adult medical examination without abnormal findings: Secondary | ICD-10-CM | POA: Diagnosis not present

## 2021-01-23 DIAGNOSIS — D6869 Other thrombophilia: Secondary | ICD-10-CM | POA: Diagnosis not present

## 2021-01-23 DIAGNOSIS — E781 Pure hyperglyceridemia: Secondary | ICD-10-CM | POA: Diagnosis not present

## 2021-01-27 ENCOUNTER — Other Ambulatory Visit: Payer: Self-pay

## 2021-01-27 ENCOUNTER — Ambulatory Visit: Payer: Medicare PPO | Admitting: Cardiology

## 2021-01-27 ENCOUNTER — Encounter: Payer: Self-pay | Admitting: Cardiology

## 2021-01-27 VITALS — BP 112/62 | HR 69 | Ht 74.0 in | Wt 244.0 lb

## 2021-01-27 DIAGNOSIS — E0590 Thyrotoxicosis, unspecified without thyrotoxic crisis or storm: Secondary | ICD-10-CM | POA: Diagnosis not present

## 2021-01-27 DIAGNOSIS — I480 Paroxysmal atrial fibrillation: Secondary | ICD-10-CM

## 2021-01-27 NOTE — Progress Notes (Signed)
Cardiology Office Note:    Date:  01/27/2021   ID:  Dustin Gray, DOB [birth year], MRN [id number]  PCP:  [name], MD  Cardiologist:  [name], MD  Electrophysiologist:  None   Referring MD: [name], MD   Chief Complaint  Patient presents with   Other    6 week follow up. Meds reviewed verbally with patient.     History of Present Illness:    Dustin Gray is a 65 y.o. male with a hx of paroxysmal atrial fibrillation, multiple sclerosis, lymphedema, hypothyroidism/Graves' disease, who presents for follow-up.    Patient being seen for paroxysmal atrial fibrillation.  Noted to be tachycardic and in atrial fibrillation during last visit.  Started on Cardizem SR twice daily, Inderal was continued.  Thyroid function medications/methimazole is being adjusted by endocrinology.  Heart rates are better controlled.  He feels well, denies palpitations.   Prior notes Patient originally seen in the hospital for atrial fibrillation with rapid ventricular response.  He was managed with diltiazem and Eliquis.  He was subsequently diagnosed with hyperthyroidism and propanolol ER added to his medical regimen.   Echocardiogram on 09/2019 showed normal ejection fraction with EF 55 to 60%, left atrial size was also normal.   He was started on anticoagulation due to plan for cardioversion.  Cardioversion was attempted but unsuccessful.  His heart rates have improved when hyperthyroidism is controlled.  Past Medical History:  Diagnosis Date   Abscess    groin   Arthritis    lower spine   Erectile dysfunction    Low testosterone    Lumbar herniated disc    L5   Multiple sclerosis (HCC)    Staph aureus infection     Past Surgical History:  Procedure Laterality Date   COLONOSCOPY WITH PROPOFOL N/A 07/04/2016   Procedure: COLONOSCOPY WITH PROPOFOL;  Surgeon: [name], MD;  Location: [facility];  Service: Endoscopy;  Laterality: N/A;   [place]   POLYPECTOMY  07/04/2016   Procedure: POLYPECTOMY;  Surgeon: [name], MD;  Location: [facility];  Service: Endoscopy;;   TONSILLECTOMY      Current Medications: Current Meds  Medication Sig   apixaban (ELIQUIS) 5 MG TABS tablet Take 1 tablet (5 mg total) by mouth 2 (two) times daily.   baclofen (LIORESAL) 10 MG tablet Take 10 mg by mouth 3 (three) times daily.   diltiazem (CARDIZEM SR) 120 MG 12 hr capsule Take 1 capsule (120 mg total) by mouth 2 (two) times daily.   Dimethyl Fumarate 240 MG CPDR Take 240 mg by mouth 2 (two) times daily.    furosemide (LASIX) 40 MG tablet Take 1 tablet (40 mg total) by mouth as needed. With your potassium.   gabapentin (NEURONTIN) 300 MG capsule Take 300-600 mg by mouth See admin instructions. Take 1 capsule (300 mg) by mouth in the morning, take 1 capsule (300 mg) by mouth at midday, & take 2 capsules (600 mg) by mouth at bedtime.   meloxicam (MOBIC) 15 MG tablet Take 15 mg by mouth daily as needed for pain.    methimazole (TAPAZOLE) 5 MG tablet Take 5 mg by mouth daily.   methimazole (TAPAZOLE) 5 MG tablet Take 2.5 MG by mouth rotating with a 5 MG every other day -- Starting Aug 11th.   potassium chloride (KLOR-CON) 10 MEQ tablet Take 1 tablet (10 mEq total) by mouth as needed. Take with your Lasix.  propranolol ER (INDERAL LA) 80 MG 24 hr capsule Take 1 capsule (80 mg total) by mouth 2 (two) times daily.     Allergies:   Cephalexin, Ancef [cefazolin sodium], Cefadroxil, and Cefazolin   Social History   Socioeconomic History   Marital status: Legally Separated    Spouse name: Not on file   Number of children: Not on file   Years of education: Not on file   Highest education level: Not on file  Occupational History   Not on file  Tobacco Use   Smoking status: Former   Smokeless tobacco: Never   Tobacco comments:    smoked as teenager  Substance and Sexual Activity   Alcohol use: Yes    Comment: 2  drinks/month   Drug use: No   Sexual activity: Not on file  Other Topics Concern   Not on file  Social History Narrative   Not on file   Social Determinants of Health   Financial Resource Strain: Not on file  Food Insecurity: Not on file  Transportation Needs: Not on file  Physical Activity: Not on file  Stress: Not on file  Social Connections: Not on file     Family History: The patient's family history includes Diabetes in his father.  ROS:   Please see the history of present illness.     All other systems reviewed and are negative.  EKGs/Labs/Other Studies Reviewed:    The following studies were reviewed today:   EKG:  EKG is  ordered today.  The ekg ordered today demonstrates normal sinus rhythm, normal ECG, heart rate 69.  Recent Labs: No results found for requested labs within last 8760 hours.  Recent Lipid Panel    Component Value Date/Time   CHOL 108 10/01/2019 0358   TRIG 81 10/01/2019 0358   HDL 35 (L) 10/01/2019 0358   CHOLHDL 3.1 10/01/2019 0358   VLDL 16 10/01/2019 0358   LDLCALC 57 10/01/2019 0358    Physical Exam:    VS:  BP 112/62 (BP Location: Left Arm, Patient Position: Sitting, Cuff Size: Large)   Pulse 69   Ht '6\' 2"'$  (1.88 m)   Wt 244 lb (110.7 kg)   SpO2 97%   BMI 31.33 kg/m     Wt Readings from Last 3 Encounters:  01/27/21 244 lb (110.7 kg)  12/19/20 236 lb (107 kg)  06/20/20 238 lb 4 oz (108.1 kg)     GEN:  Well nourished, well developed in no acute distress HEENT: Normal NECK: No JVD; No carotid bruits LYMPHATICS: No lymphadenopathy CARDIAC: Regular rate and rhythm no murmurs, rubs, gallops RESPIRATORY:  Clear to auscultation without rales, wheezing or rhonchi  ABDOMEN: Soft, non-tender, non-distended MUSCULOSKELETAL:  no edema; No deformity  SKIN: Warm and dry NEUROLOGIC:  Alert and oriented x 3 PSYCHIATRIC:  Normal affect   ASSESSMENT:    1. Paroxysmal A-fib ([place])   2. Hyperthyroidism      PLAN:    In order of  problems listed above:  Patient with history of paroxysmal atrial fibrillation.  Heart rate controlled today, EKG shows normal sinus rhythm.  A. fib usually controlled when thyroid function is adequately controlled.  Continue Cardizem SR 120 twice daily, continue Inderal, continue Eliquis 5 mg twice daily.   CHA2DS2-VASc score 0.  A. fib driven by hyperthyroidism.  .  Plan to taper down Cardizem if thyroid function is adequately controlled .  Once euthyroid state is demonstrated,  May consider stopping long-term anticoagulation with Eliquis if  no recurrence of A. fib shown on monitor. hyperthyroidism.  On methimazole. management as per endocrinology.  Follow-up in 6 months  Total encounter time 30 minutes  Greater than 50% was spent in counseling and coordination of care with the patient  Medication Adjustments/Labs and Tests Ordered: Current medicines are reviewed at length with the patient today.  Concerns regarding medicines are outlined above.  No orders of the defined types were placed in this encounter.   No orders of the defined types were placed in this encounter.    There are no Patient Instructions on file for this visit.   Signed, [name], MD  01/27/2021 11:15 AM    [place]

## 2021-01-27 NOTE — Patient Instructions (Signed)

## 2021-01-31 ENCOUNTER — Other Ambulatory Visit: Payer: Self-pay

## 2021-01-31 ENCOUNTER — Ambulatory Visit: Payer: Medicare PPO | Attending: Neurology

## 2021-01-31 DIAGNOSIS — R2689 Other abnormalities of gait and mobility: Secondary | ICD-10-CM | POA: Insufficient documentation

## 2021-01-31 DIAGNOSIS — M6281 Muscle weakness (generalized): Secondary | ICD-10-CM

## 2021-01-31 DIAGNOSIS — R278 Other lack of coordination: Secondary | ICD-10-CM | POA: Insufficient documentation

## 2021-01-31 DIAGNOSIS — R2681 Unsteadiness on feet: Secondary | ICD-10-CM | POA: Insufficient documentation

## 2021-01-31 NOTE — Therapy (Signed)
[place] MAIN [facility] SERVICES [address] [place], [place], [address] Phone: [phone]   Fax:  [phone]  Physical Therapy Evaluation  Patient Details  Name: Dustin Gray MRN: [id number] Date of Birth: Mar 23, 1956 No data recorded  Encounter Date: 01/31/2021   PT End of Session - 01/31/21 1318     Visit Number 1    Number of Visits 24    Date for PT Re-Evaluation 04/25/21    Authorization Type 1/10 eval 8/9    PT Start Time 1058    PT Stop Time 1159    PT Time Calculation (min) 61 min    Equipment Utilized During Treatment Gait belt    Activity Tolerance Patient tolerated treatment well    Behavior During Therapy WFL for tasks assessed/performed             Past Medical History:  Diagnosis Date   Abscess    groin   Arthritis    lower spine   Erectile dysfunction    Low testosterone    Lumbar herniated disc    L5   Multiple sclerosis ([place])    Staph aureus infection     Past Surgical History:  Procedure Laterality Date   COLONOSCOPY WITH PROPOFOL N/A 07/04/2016   Procedure: COLONOSCOPY WITH PROPOFOL;  Surgeon: [name], MD;  Location: [place];  Service: Endoscopy;  Laterality: N/A;   [place]   POLYPECTOMY  07/04/2016   Procedure: POLYPECTOMY;  Surgeon: [name], MD;  Location: [place];  Service: Endoscopy;;   TONSILLECTOMY      There were no vitals filed for this visit.    Subjective Assessment - 01/31/21 1112     Subjective Patient is a pleasant 65 year old male who presents to physical therapy for instability, gait training, and fatigue in combination with his diagnosis of MS.    Pertinent History Patient presents to physical therapy for unsteadiness, walking instability, fatigue. His PMH includes paroxysmal a fib, MS, lymphedema, hypothyroidism, Graves Disease, arthritis, lumbar herniated disc (L5), depression and neuropathy. Patient first  developed symptoms of MS in 2005 and was diagnosed in 2006. Patient has done PT in the past, but hasn't been seen since 2020 at [facility]. Has not been doing his exercises in the past year. Walk outside to garage to ride lawnmower, walks in house. 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 a couple hours    How long can you stand comfortably? no more than 10 minutes before pain, have to steady self immediately    How long can you walk comfortably? has to use rollator. can walk in a store with a cart    Patient Stated Goals want to get stronger and feel mor comfortable walking around.    Currently in Pain? No/denies               wants to be able to get up from the ground if he falls.    PAIN: Is going to get a laminectomy for his back.   POSTURE: Patient has slight trunk flexion in standing with lateral weight shift onto RLE.   PROM/AROM: AROM BUE: WFL AROM BLE: LLE limited by strength. RLE WFL    STRENGTH:  Graded on a 0-5 scale Muscle Group Left Right  Shoulder flex 4/5  4+/5  Shoulder Abd 4-/5 4/5  Shoulder Ext 4/5 4/5  Elbow 4/5 4+/5  Wrist/hand 4/5 4+/5  Hip Flex 2/5 5/5  Hip Abd 3-/5 4+/5  Hip Add 3-/5 4+/5  Hip Ext 3+/5 4-/5  Hip IR/ER 25 4/5  Knee Flex 2/5 4+/5  Knee Ext 4-/5 4+/5  Ankle DF 2+/5 4/5  Ankle PF 2+/5 4/5     SENSATION:  BUE :  BLE :   NEUROLOGICAL SCREEN: (2+ unless otherwise noted.) N=normal  Ab=abnormal   Level Dermatome R L  C3 Anterior Neck  N N  C4 Top of Shoulder N N  C5 Lateral Upper Arm  N N  C6 Lateral Arm/ Thumb  Ab Ab  C7 Middle Finger  Ab Ab  C8 4th & 5th Finger Ab Ab  T1 Medial Arm N N  L2 Medial thigh/groin N N  L3 Lower thigh/med.knee N N  L4 Medial leg/lat thigh N N  L5 Lat. leg & dorsal foot N N  S1 post/lat foot/thigh/leg N N  S2 Post./med. thigh & leg N N     SOMATOSENSORY:  Any N & T in extremities or weakness: reports :         Sensation           Intact      Diminished         Absent  Light touch LEs Bilateral fingers and feet                              COORDINATION: Finger to Nose: intact with slow speed.           Heel Shin Slide Test:unable to perform with LLE, unable to lift leg     FUNCTIONAL MOBILITY: STS without hands: weight shift onto RLE   BALANCE: Static Sitting Balance  Normal Able to maintain balance against maximal resistance   Good Able to maintain balance against moderate resistance   Good-/Fair+ Accepts minimal resistance x  Fair Able to sit unsupported without balance loss and without UE support   Poor+ Able to maintain with Minimal assistance from individual or chair   Poor Unable to maintain balance-requires mod/max support from individual or chair    Static Standing Balance  Normal Able to maintain standing balance against maximal resistance   Good Able to maintain standing balance against moderate resistance   Good-/Fair+ Able to maintain standing balance against minimal resistance   Fair Able to stand unsupported without UE support and without LOB for 1-2 min x  Fair- Requires Min A and UE support to maintain standing without loss of balance   Poor+ Requires mod A and UE support to maintain standing without loss of balance   Poor Requires max A and UE support to maintain standing balance without loss    Dynamic Sitting Balance  Normal Able to sit unsupported and weight shift across midline maximally   Good Able to sit unsupported and weight shift across midline moderately   Good-/Fair+ Able to sit unsupported and weight shift across midline minimally x  Fair Minimal weight shifting ipsilateral/front, difficulty crossing midline   Fair- Reach to ipsilateral side and unable to weight shift   Poor + Able to sit unsupported with min A and reach to ipsilateral side, unable to weight shift   Poor Able to  sit unsupported with mod A and reach ipsilateral/front-can't cross midline    Standing Dynamic Balance  Normal Stand independently  unsupported, able to weight shift and cross midline maximally   Good Stand independently unsupported, able to weight shift and cross midline moderately   Good-/Fair+ Stand independently unsupported, able to weight shift across midline minimally   Fair Stand independently unsupported, weight shift, and reach ipsilaterally, loss of balance when crossing midline x  Poor+ Able to stand with Min A and reach ipsilaterally, unable to weight shift   Poor Able to stand with Mod A and minimally reach ipsilaterally, unable to cross midline.      GAIT: L knee hyperextension with weight bearing, shift onto R side with excessive pushing through R arm . L foot drag with fatigue with bilateral foot eversion.   OUTCOME MEASURES: TEST Outcome Interpretation  5 times sit<>stand 13.13 sec no hands  >60 yo, >15 sec indicates increased risk for falls  10 meter walk test    15.32              m/s <1.0 m/s indicates increased risk for falls; limited community ambulator      6 minute walk test        505 ft  with rollator      1000 feet is community Water quality scientist Do next session  <36/56 (100% risk for falls), 37-45 (80% risk for falls); 46-51 (>50% risk for falls); 52-55 (lower risk <25% of falls)  FOTO 64, risk adjusted 34 %            Next session will benefit from BERG and HEP program.   Objective measurements completed on examination: See above findings.            PT Education - 01/31/21 1419     Education Details goals POC    Person(s) Educated Patient    Methods Explanation    Comprehension Verbalized understanding              PT Short Term Goals - 01/31/21 1419       PT SHORT TERM GOAL #1   Title Patient will be independent in home exercise program to improve strength/mobility for better functional independence with ADLs.     Baseline 8/9: HEP to be given next session    Time 4    Period Weeks    Status New    Target Date 02/28/21               PT Long Term Goals - 01/31/21 1420       PT LONG TERM GOAL #1   Title Patient will increase FOTO score to equal to or greater than 70%    to demonstrate statistically significant improvement in mobility and quality of life.    Baseline 8/9: 64%; risk adjusted 34%    Time 12    Period Weeks    Status New    Target Date 04/25/21      PT LONG TERM GOAL #2   Title Patient will increase Berg Balance score by > 6 points to demonstrate decreased fall risk during functional activities.    Baseline 8/9: perform next session    Time 12    Period Weeks    Status New    Target Date 04/25/21      PT LONG TERM GOAL #3   Title Patient will increase 10 meter walk test to >1.60ms as to improve gait speed for better community ambulation and to reduce fall risk.    Baseline 8/9: 0.65 m/s with rollator    Time 12  Period Weeks    Status New    Target Date 04/25/21      PT LONG TERM GOAL #4   Title Patient will increase six minute walk test distance to >1000 for progression to community ambulator and improve gait ability    Baseline 8/9: 505 ft with rollator    Time 12    Period Weeks    Status New    Target Date 04/25/21                    Plan - 01/31/21 1319     Clinical Impression Statement Patient is a pleasant 65 year old male who presents to physical therapy for instability, gait training, and fatigue in combination with his diagnosis of MS. Patient demonstrates weakness of LUE and LLE compared to his right with additional limitations of ROM due to weakness of LLE. Patient ambulates with a rollator when outside his home and demonstrates decreased gait speed and foot drag with ambulation. His capacity for functional mobility is limited as can be seen in 6 minute walk test. Unfortunately there was not enough time to test BERG this session so that  will be performed next session in addition to supplying the patient his HEP program. Patient will benefit from skilled physical therapy to increase strength, capacity for functional mobility, and stability to improve quality of life and decrease risk for falls.    Personal Factors and Comorbidities Age;Comorbidity 3+;Fitness;Past/Current Experience;Time since onset of injury/illness/exacerbation    Comorbidities aroxysmal a fib, MS, lymphedema, hypothyroidism, Graves Disease, arthritis, lumbar herniated disc (L5), depression and neuropathy    Examination-Activity Limitations Bed Mobility;Bend;Caring for Others;Carry;Reach Overhead;Locomotion Level;Lift;Hygiene/Grooming;Dressing;Squat;Stairs;Stand;Toileting;Transfers    Examination-Participation Restrictions Cleaning;Community Activity;Laundry;Occupation;Shop;Volunteer;Yard Work    Merchant navy officer Evolving/Moderate complexity    Clinical Decision Making Moderate    Rehab Potential Fair    PT Frequency 2x / week    PT Duration 12 weeks    PT Treatment/Interventions ADLs/Self Care Home Management;Aquatic Therapy;Canalith Repostioning;Biofeedback;Cryotherapy;Ultrasound;Traction;Moist Heat;Electrical Stimulation;DME Instruction;Gait training;Therapeutic exercise;Therapeutic activities;Functional mobility training;Stair training;Balance training;Neuromuscular re-education;Patient/family education;Manual techniques;Orthotic Fit/Training;Compression bandaging;Passive range of motion;Vestibular;Taping;Splinting;Energy conservation;Dry needling;Visual/perceptual remediation/compensation    PT Next Visit Plan BERG, give HEP    PT Home Exercise Plan give next session    Consulted and Agree with Plan of Care Patient             Patient will benefit from skilled therapeutic intervention in order to improve the following deficits and impairments:  Abnormal gait, Cardiopulmonary status limiting activity, Decreased activity tolerance,  Decreased coordination, Decreased balance, Decreased endurance, Decreased mobility, Decreased strength, Decreased range of motion, Difficulty walking, Impaired flexibility, Impaired sensation, Postural dysfunction, Improper body mechanics  Visit Diagnosis: Other abnormalities of gait and mobility  Unsteadiness on feet  Muscle weakness (generalized)     Problem List Patient Active Problem List   Diagnosis Date Noted   Persistent atrial fibrillation (HCC)    Graves disease    Abdominal bloating    Atrial fibrillation with RVR ([place]) 10/01/2019   Hyperthyroidism    Atrial fibrillation with rapid ventricular response ([place]) 09/30/2019   Leg edema    Left leg cellulitis    Special screening for malignant neoplasms, colon    Polyp of sigmoid colon    Benign neoplasm of ascending colon    Rectal polyp    Herniated nucleus pulposus, L5-S1 11/09/2015   Low back pain 10/25/2015   Herniated nucleus pulposus, C5-6 right 10/06/2015   Dysesthesia 09/16/2015   Spasticity 09/16/2015   Unsteady  gait 09/16/2015   Multiple sclerosis ([place]) 06/08/2011    Janna Arch, PT, DPT  01/31/2021, 2:24 PM  [place] MAIN [facility] SERVICES [address] [place], [place], [address] Phone: [phone]   Fax:  [phone]  Name: Dustin Gray MRN: [id number] Date of Birth: Mar 29, 1956

## 2021-02-07 ENCOUNTER — Ambulatory Visit: Payer: Medicare PPO

## 2021-02-07 ENCOUNTER — Other Ambulatory Visit: Payer: Self-pay

## 2021-02-07 DIAGNOSIS — R2681 Unsteadiness on feet: Secondary | ICD-10-CM | POA: Diagnosis not present

## 2021-02-07 DIAGNOSIS — R278 Other lack of coordination: Secondary | ICD-10-CM | POA: Diagnosis not present

## 2021-02-07 DIAGNOSIS — M6281 Muscle weakness (generalized): Secondary | ICD-10-CM

## 2021-02-07 DIAGNOSIS — R2689 Other abnormalities of gait and mobility: Secondary | ICD-10-CM

## 2021-02-07 NOTE — Therapy (Signed)
[place] MAIN [facility] SERVICES [address] [place], [place], [address] Phone: [phone]   Fax:  [phone]  Physical Therapy Treatment  Patient Details  Name: Dustin Gray MRN: [id number] Date of Birth: [birth year] No data recorded  Encounter Date: 02/07/2021   PT End of Session - 02/07/21 1205     Visit Number 2    Number of Visits 24    Date for PT Re-Evaluation 04/25/21    Authorization Type 2/10 eval 8/9    PT Start Time 1114    PT Stop Time 1158    PT Time Calculation (min) 44 min    Equipment Utilized During Treatment Gait belt    Activity Tolerance Patient tolerated treatment well    Behavior During Therapy WFL for tasks assessed/performed             Past Medical History:  Diagnosis Date   Abscess    groin   Arthritis    lower spine   Erectile dysfunction    Low testosterone    Lumbar herniated disc    L5   Multiple sclerosis ([place])    Staph aureus infection     Past Surgical History:  Procedure Laterality Date   COLONOSCOPY WITH PROPOFOL N/A 07/04/2016   Procedure: COLONOSCOPY WITH PROPOFOL;  Surgeon: [name], MD;  Location: [place];  Service: Endoscopy;  Laterality: N/A;   [place]   POLYPECTOMY  07/04/2016   Procedure: POLYPECTOMY;  Surgeon: [name], MD;  Location: [place];  Service: Endoscopy;;   TONSILLECTOMY      There were no vitals filed for this visit.   Subjective Assessment - 02/07/21 1204     Subjective Patient reports he has been working on cars due to having an issue with both of his cars. No falls since last session.    Pertinent History Patient presents to physical therapy for unsteadiness, walking instability, fatigue. His PMH includes paroxysmal a fib, MS, lymphedema, hypothyroidism, Graves Disease, arthritis, lumbar herniated disc (L5), depression and neuropathy. Patient first developed symptoms of MS in 2005 and was  diagnosed in 2006. Patient has done PT in the past, but hasn't been seen since 2020 at [facility]. Has not been doing his exercises in the past year. Walk outside to garage to ride lawnmower, walks in house. 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 a couple hours    How long can you stand comfortably? no more than 10 minutes before pain, have to steady self immediately    How long can you walk comfortably? has to use rollator. can walk in a store with a cart    Patient Stated Goals want to get stronger and feel mor comfortable walking around.    Currently in Pain? No/denies                [facility] PT Assessment - 02/07/21 0001       Balance   Balance Assessed Yes      Standardized Balance Assessment   Standardized Balance Assessment Berg Balance Test      Berg Balance Test   Sit to Stand Able to stand  independently using hands    Standing Unsupported Able to stand 2 minutes with supervision    Sitting with Back Unsupported but Feet Supported on  Floor or Stool Able to sit safely and securely 2 minutes    Stand to Sit Controls descent by using hands    Transfers Able to transfer safely, definite need of hands    Standing Unsupported with Eyes Closed Able to stand 3 seconds    Standing Unsupported with Feet Together Able to place feet together independently but unable to hold for 30 seconds    From Standing, Reach Forward with Outstretched Arm Can reach forward >5 cm safely (2")    From Standing Position, Pick up Object from Floor Able to pick up shoe, needs supervision    From Standing Position, Turn to Look Behind Over each Shoulder Looks behind one side only/other side shows less weight shift    Turn 360 Degrees Needs assistance while turning    Standing Unsupported, Alternately Place Feet on Step/Stool Needs assistance to keep from falling or  unable to try    Standing Unsupported, One Foot in Front Needs help to step but can hold 15 seconds    Standing on One Leg Unable to try or needs assist to prevent fall    Total Score 29              Do BERG: 29/ 56  Give HEP: Access Code: [email or web address] URL: [email or web address] Date: 02/07/2021 Prepared by: Janna Arch  Exercises  Seated Long Arc Quad - 1 x daily - 7 x weekly - 2 sets - 10 reps - 5 hold Standing March with Counter Support - 1 x daily - 7 x weekly - 2 sets - 10 reps - 5 hold Standing Hip Extension with Counter Support - 1 x daily - 7 x weekly - 2 sets - 10 reps - 5 hold Seated Ankle Circles - 1 x daily - 7 x weekly - 2 sets - 10 reps - 5 hold     Treatment:   Standing with CGA next to support surface:  Airex pad: static stand 30 seconds  noticeable trembling of ankles/LE's with fatigue and challenge to maintain stability Airex pad: horizontal head turns 30 seconds scanning room 10x ; cueing for arc of motion  Airex pad: vertical head turns 30 seconds, cueing for arc of motion, noticeable sway with upward gaze increasing demand on ankle righting reaction musculature Squat with BUE support and chair behind 10x cues for posterior weight shift for optimal muscle recruitment Standing heel raise 10x with BUE support on bars  Seated: 6" step toe taps 10x each LE, very challenging LLE RTB hamstring curl 15x each LE  RTB adductor  10x each LE    Pt educated throughout session about proper posture and technique with exercises. Improved exercise technique, movement at target joints, use of target muscles after min to mod verbal, visual, tactile cues.  Patient educated on and performed HEP demonstrating understanding and importance of not allowing LLE to hyperextend during standing interventions. He performed the BERG test scoring a 29/56 indicating a higher fall risk and need for rollator for mobility. Patient tolerated rest of session well with LLE  fatiguing quicker than RLE. Patient will benefit from skilled physical therapy to increase strength, capacity for functional mobility, and stability to improve quality of life and decrease risk for falls.           PT Education - 02/07/21 1205     Education Details exercise technique, body mechanics, HEP, BERG    Person(s) Educated Patient    Methods Explanation;Demonstration;Tactile cues;Verbal cues;Handout    Comprehension Verbalized understanding;Returned demonstration;Verbal  cues required;Tactile cues required;Need further instruction              PT Short Term Goals - 01/31/21 1419       PT SHORT TERM GOAL #1   Title Patient will be independent in home exercise program to improve strength/mobility for better functional independence with ADLs.    Baseline 8/9: HEP to be given next session    Time 4    Period Weeks    Status New    Target Date 02/28/21               PT Long Term Goals - 01/31/21 1420       PT LONG TERM GOAL #1   Title Patient will increase FOTO score to equal to or greater than 70%    to demonstrate statistically significant improvement in mobility and quality of life.    Baseline 8/9: 64%; risk adjusted 34%    Time 12    Period Weeks    Status New    Target Date 04/25/21      PT LONG TERM GOAL #2   Title Patient will increase Berg Balance score by > 6 points to demonstrate decreased fall risk during functional activities.    Baseline 8/9: perform next session    Time 12    Period Weeks    Status New    Target Date 04/25/21      PT LONG TERM GOAL #3   Title Patient will increase 10 meter walk test to >1.3ms as to improve gait speed for better community ambulation and to reduce fall risk.    Baseline 8/9: 0.65 m/s with rollator    Time 12    Period Weeks    Status New    Target Date 04/25/21      PT LONG TERM GOAL #4   Title Patient will increase six minute walk test distance to >1000 for progression to community ambulator and  improve gait ability    Baseline 8/9: 505 ft with rollator    Time 12    Period Weeks    Status New    Target Date 04/25/21                   Plan - 02/07/21 1206     Clinical Impression Statement Patient educated on and performed HEP demonstrating understanding and importance of not allowing LLE to hyperextend during standing interventions. He performed the BERG test scoring a 29/56 indicating a higher fall risk and need for rollator for mobility. Patient tolerated rest of session well with LLE fatiguing quicker than RLE. Patient will benefit from skilled physical therapy to increase strength, capacity for functional mobility, and stability to improve quality of life and decrease risk for falls.    Personal Factors and Comorbidities Age;Comorbidity 3+;Fitness;Past/Current Experience;Time since onset of injury/illness/exacerbation    Comorbidities aroxysmal a fib, MS, lymphedema, hypothyroidism, Graves Disease, arthritis, lumbar herniated disc (L5), depression and neuropathy    Examination-Activity Limitations Bed Mobility;Bend;Caring for Others;Carry;Reach Overhead;Locomotion Level;Lift;Hygiene/Grooming;Dressing;Squat;Stairs;Stand;Toileting;Transfers    Examination-Participation Restrictions Cleaning;Community Activity;Laundry;Occupation;Shop;Volunteer;Yard Work    SMerchant navy officerEvolving/Moderate complexity    Rehab Potential Fair    PT Frequency 2x / week    PT Duration 12 weeks    PT Treatment/Interventions ADLs/Self Care Home Management;Aquatic Therapy;Canalith Repostioning;Biofeedback;Cryotherapy;Ultrasound;Traction;Moist Heat;Electrical Stimulation;DME Instruction;Gait training;Therapeutic exercise;Therapeutic activities;Functional mobility training;Stair training;Balance training;Neuromuscular re-education;Patient/family education;Manual techniques;Orthotic Fit/Training;Compression bandaging;Passive range of motion;Vestibular;Taping;Splinting;Energy  conservation;Dry needling;Visual/perceptual remediation/compensation    PT Next Visit Plan strengthening, balance, reducing L  knee hyperextension    PT Home Exercise Plan give next session    Consulted and Agree with Plan of Care Patient             Patient will benefit from skilled therapeutic intervention in order to improve the following deficits and impairments:  Abnormal gait, Cardiopulmonary status limiting activity, Decreased activity tolerance, Decreased coordination, Decreased balance, Decreased endurance, Decreased mobility, Decreased strength, Decreased range of motion, Difficulty walking, Impaired flexibility, Impaired sensation, Postural dysfunction, Improper body mechanics  Visit Diagnosis: Other abnormalities of gait and mobility  Unsteadiness on feet  Muscle weakness (generalized)     Problem List Patient Active Problem List   Diagnosis Date Noted   Persistent atrial fibrillation (HCC)    Graves disease    Abdominal bloating    Atrial fibrillation with RVR ([place]) 10/01/2019   Hyperthyroidism    Atrial fibrillation with rapid ventricular response ([place]) 09/30/2019   Leg edema    Left leg cellulitis    Special screening for malignant neoplasms, colon    Polyp of sigmoid colon    Benign neoplasm of ascending colon    Rectal polyp    Herniated nucleus pulposus, L5-S1 11/09/2015   Low back pain 10/25/2015   Herniated nucleus pulposus, C5-6 right 10/06/2015   Dysesthesia 09/16/2015   Spasticity 09/16/2015   Unsteady gait 09/16/2015   Multiple sclerosis ([place]) 06/08/2011    Janna Arch, PT, DPT  02/07/2021, 12:07 PM  [place] MAIN [facility] SERVICES [address] [place], [place], [address] Phone: [phone]   Fax:  [phone]  Name: Dustin Gray MRN: [id number] Date of Birth: 02-Mar-1956

## 2021-02-09 ENCOUNTER — Ambulatory Visit: Payer: Medicare PPO

## 2021-02-13 DIAGNOSIS — H269 Unspecified cataract: Secondary | ICD-10-CM | POA: Diagnosis not present

## 2021-02-13 DIAGNOSIS — F439 Reaction to severe stress, unspecified: Secondary | ICD-10-CM | POA: Diagnosis not present

## 2021-02-13 DIAGNOSIS — I10 Essential (primary) hypertension: Secondary | ICD-10-CM | POA: Diagnosis not present

## 2021-02-13 DIAGNOSIS — E0590 Thyrotoxicosis, unspecified without thyrotoxic crisis or storm: Secondary | ICD-10-CM | POA: Diagnosis not present

## 2021-02-13 DIAGNOSIS — G8929 Other chronic pain: Secondary | ICD-10-CM | POA: Diagnosis not present

## 2021-02-13 DIAGNOSIS — I4891 Unspecified atrial fibrillation: Secondary | ICD-10-CM | POA: Diagnosis not present

## 2021-02-13 DIAGNOSIS — G629 Polyneuropathy, unspecified: Secondary | ICD-10-CM | POA: Diagnosis not present

## 2021-02-13 DIAGNOSIS — E669 Obesity, unspecified: Secondary | ICD-10-CM | POA: Diagnosis not present

## 2021-02-13 DIAGNOSIS — G35 Multiple sclerosis: Secondary | ICD-10-CM | POA: Diagnosis not present

## 2021-02-14 ENCOUNTER — Other Ambulatory Visit: Payer: Self-pay

## 2021-02-14 ENCOUNTER — Ambulatory Visit: Payer: Medicare PPO

## 2021-02-14 DIAGNOSIS — R2689 Other abnormalities of gait and mobility: Secondary | ICD-10-CM

## 2021-02-14 DIAGNOSIS — R278 Other lack of coordination: Secondary | ICD-10-CM | POA: Diagnosis not present

## 2021-02-14 DIAGNOSIS — R2681 Unsteadiness on feet: Secondary | ICD-10-CM | POA: Diagnosis not present

## 2021-02-14 DIAGNOSIS — M6281 Muscle weakness (generalized): Secondary | ICD-10-CM | POA: Diagnosis not present

## 2021-02-14 NOTE — Therapy (Signed)
[place] MAIN [facility] SERVICES [address] [place], [place], [address] Phone: [phone]   Fax:  [phone]  Physical Therapy Treatment  Patient Details  Name: Dustin Gray MRN: [id number] Date of Birth: 1955-11-20 No data recorded  Encounter Date: 02/14/2021   PT End of Session - 02/14/21 1051     Visit Number 3    Number of Visits 24    Date for PT Re-Evaluation 04/25/21    Authorization Type [employer]    Authorization Time Period 01/31/21-04/25/21    PT Start Time 1045    PT Stop Time 1112    PT Time Calculation (min) 27 min    Equipment Utilized During Treatment Gait belt    Activity Tolerance Patient tolerated treatment well    Behavior During Therapy WFL for tasks assessed/performed             Past Medical History:  Diagnosis Date   Abscess    groin   Arthritis    lower spine   Erectile dysfunction    Low testosterone    Lumbar herniated disc    L5   Multiple sclerosis ([place])    Staph aureus infection     Past Surgical History:  Procedure Laterality Date   COLONOSCOPY WITH PROPOFOL N/A 07/04/2016   Procedure: COLONOSCOPY WITH PROPOFOL;  Surgeon: [name], MD;  Location: [place];  Service: Endoscopy;  Laterality: N/A;   [place]   POLYPECTOMY  07/04/2016   Procedure: POLYPECTOMY;  Surgeon: [name], MD;  Location: [place];  Service: Endoscopy;;   TONSILLECTOMY      There were no vitals filed for this visit.   Subjective Assessment - 02/14/21 1050     Subjective Pt doing well today, no new complaints. Pt has done his HEP all but 1 day since prior session. No falls since last session.    Pertinent History Patient presents to physical therapy for unsteadiness, walking instability, fatigue. His PMH includes paroxysmal a fib, MS, lymphedema, hypothyroidism, Graves Disease, arthritis, lumbar herniated disc (L5), depression and neuropathy. Patient  first developed symptoms of MS in 2005 and was diagnosed in 2006. Patient has done PT in the past, but hasn't been seen since 2020 at [facility]. Has not been doing his exercises in the past year. Walk outside to garage to ride lawnmower, walks in house. 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 No/denies            INTERVENTION THIS DATE: FLOOR TRANSFERS ASSESSMENT AND EDUCATION ON WIDE PLINTH  -STS from transport chair c 4WW, minGuardA to chair -STS from chair 4WW, minGuard Assist, AMB to wide plinth (76f)  -Sitting to Prone (supervision) -Prone to Quaruped (5x at modI, moderate exertion)  -Quadruped to Left half kneeling quadruped 2x, then 4x with Left knee on foam to facilitate Rt foot swing clearance (All with modA of RLE to achieve 1 large amplitude movement) *rest break -STS from plinth c chairback support x5  -Standing RLE to 12" step x5, 16" step x5 (minA to prevent Left knee buckling) -STS from standard height, RLE on 4" step, chair back for BUE support    *demonstration of alternative quadruped to downward dog floor transfers, questioning if passive hamstrings tension would facilitate quads weakness in this method for future attempt   PT Education - 02/14/21 1458  Education Details techniques for floor transfers    Person(s) Educated Patient    Methods Explanation;Demonstration;Tactile cues    Comprehension Verbalized understanding;Returned demonstration;Need further instruction              PT Short Term Goals - 01/31/21 1419       PT SHORT TERM GOAL #1   Title Patient will be independent in home exercise program to improve strength/mobility for better functional independence with ADLs.    Baseline 8/9: HEP to be given next session    Time 4    Period Weeks    Status New    Target Date 02/28/21               PT Long Term Goals - 01/31/21 1420       PT LONG TERM GOAL #1   Title  Patient will increase FOTO score to equal to or greater than 70%    to demonstrate statistically significant improvement in mobility and quality of life.    Baseline 8/9: 64%; risk adjusted 34%    Time 12    Period Weeks    Status New    Target Date 04/25/21      PT LONG TERM GOAL #2   Title Patient will increase Berg Balance score by > 6 points to demonstrate decreased fall risk during functional activities.    Baseline 8/9: perform next session    Time 12    Period Weeks    Status New    Target Date 04/25/21      PT LONG TERM GOAL #3   Title Patient will increase 10 meter walk test to >1.98ms as to improve gait speed for better community ambulation and to reduce fall risk.    Baseline 8/9: 0.65 m/s with rollator    Time 12    Period Weeks    Status New    Target Date 04/25/21      PT LONG TERM GOAL #4   Title Patient will increase six minute walk test distance to >1000 for progression to community ambulator and improve gait ability    Baseline 8/9: 505 ft with rollator    Time 12    Period Weeks    Status New    Target Date 04/25/21                   Plan - 02/14/21 1053     Clinical Impression Statement Performed assessment of floor transfer early phases today to assess pt ability to come to standing as desired. Pt does well with prone to quadruped, but RLE swing into half kneeing set-up is very difficult, requires min-modA for optimal movement patterns. Used this as a strengthening target this date. Pt tolerates session well in general, clearly is exerted, but recovers within a reasonable timeframe. Will still need to maximize strengthening before pt is easily able to perform at home.    Personal Factors and Comorbidities Age;Comorbidity 3+;Fitness;Past/Current Experience;Time since onset of injury/illness/exacerbation    Comorbidities aroxysmal a fib, MS, lymphedema, hypothyroidism, Graves Disease, arthritis, lumbar herniated disc (L5), depression and neuropathy     Examination-Activity Limitations Bed Mobility;Bend;Caring for Others;Carry;Reach Overhead;Locomotion Level;Lift;Hygiene/Grooming;Dressing;Squat;Stairs;Stand;Toileting;Transfers    Examination-Participation Restrictions Cleaning;Community Activity;Laundry;Occupation;Shop;Volunteer;Yard Work    SMerchant navy officerEvolving/Moderate complexity    Clinical Decision Making Moderate    Rehab Potential Fair    PT Frequency 2x / week    PT Duration 12 weeks    PT Treatment/Interventions ADLs/Self Care Home Management;Aquatic Therapy;Canalith Repostioning;Biofeedback;Cryotherapy;Ultrasound;Traction;Moist Heat;Electrical  Stimulation;DME Instruction;Gait training;Therapeutic exercise;Therapeutic activities;Functional mobility training;Stair training;Balance training;Neuromuscular re-education;Patient/family education;Manual techniques;Orthotic Fit/Training;Compression bandaging;Passive range of motion;Vestibular;Taping;Splinting;Energy conservation;Dry needling;Visual/perceptual remediation/compensation    PT Next Visit Plan strengthening, balance, reducing L knee hyperextension; continue with floor transfers training and related strengthening    PT Home Exercise Plan No updates on 02/14/21    Consulted and Agree with Plan of Care Patient             Patient will benefit from skilled therapeutic intervention in order to improve the following deficits and impairments:  Abnormal gait, Cardiopulmonary status limiting activity, Decreased activity tolerance, Decreased coordination, Decreased balance, Decreased endurance, Decreased mobility, Decreased strength, Decreased range of motion, Difficulty walking, Impaired flexibility, Impaired sensation, Postural dysfunction, Improper body mechanics  Visit Diagnosis: Other abnormalities of gait and mobility  Unsteadiness on feet  Muscle weakness (generalized)     Problem List Patient Active Problem List   Diagnosis Date Noted   Persistent  atrial fibrillation (HCC)    Graves disease    Abdominal bloating    Atrial fibrillation with RVR ([place]) 10/01/2019   Hyperthyroidism    Atrial fibrillation with rapid ventricular response ([place]) 09/30/2019   Leg edema    Left leg cellulitis    Special screening for malignant neoplasms, colon    Polyp of sigmoid colon    Benign neoplasm of ascending colon    Rectal polyp    Herniated nucleus pulposus, L5-S1 11/09/2015   Low back pain 10/25/2015   Herniated nucleus pulposus, C5-6 right 10/06/2015   Dysesthesia 09/16/2015   Spasticity 09/16/2015   Unsteady gait 09/16/2015   Multiple sclerosis ([place]) 06/08/2011   3:07 PM, 02/14/21 Etta Grandchild, PT, DPT Physical Therapist - [facility] 02/14/2021, 3:00 PM  [place] [address] [place], [place], [address] Phone: [phone]   Fax:  [phone]  Name: GARNER VANHAITSMA MRN: [id number] Date of Birth: [birth year]

## 2021-02-16 ENCOUNTER — Ambulatory Visit: Payer: Medicare PPO

## 2021-02-21 ENCOUNTER — Ambulatory Visit: Payer: Medicare PPO

## 2021-02-21 ENCOUNTER — Other Ambulatory Visit: Payer: Self-pay

## 2021-02-21 DIAGNOSIS — R2689 Other abnormalities of gait and mobility: Secondary | ICD-10-CM

## 2021-02-21 DIAGNOSIS — R2681 Unsteadiness on feet: Secondary | ICD-10-CM | POA: Diagnosis not present

## 2021-02-21 DIAGNOSIS — R278 Other lack of coordination: Secondary | ICD-10-CM | POA: Diagnosis not present

## 2021-02-21 DIAGNOSIS — M6281 Muscle weakness (generalized): Secondary | ICD-10-CM

## 2021-02-21 NOTE — Therapy (Signed)
[place] MAIN [facility] SERVICES [address] [place], [place], [address] Phone: [phone]   Fax:  [phone]  Physical Therapy Treatment  Patient Details  Name: Dustin Gray MRN: [id number] Date of Birth: 04-06-1956 No data recorded  Encounter Date: 02/21/2021   PT End of Session - 02/21/21 1148     Visit Number 4    Number of Visits 24    Date for PT Re-Evaluation 04/25/21    Authorization Type [employer]    Authorization Time Period 01/31/21-04/25/21    PT Start Time 1051    PT Stop Time 1135    PT Time Calculation (min) 44 min    Equipment Utilized During Treatment Gait belt    Activity Tolerance Patient tolerated treatment well    Behavior During Therapy WFL for tasks assessed/performed             Past Medical History:  Diagnosis Date   Abscess    groin   Arthritis    lower spine   Erectile dysfunction    Low testosterone    Lumbar herniated disc    L5   Multiple sclerosis ([place])    Staph aureus infection     Past Surgical History:  Procedure Laterality Date   COLONOSCOPY WITH PROPOFOL N/A 07/04/2016   Procedure: COLONOSCOPY WITH PROPOFOL;  Surgeon: [name], MD;  Location: [place];  Service: Endoscopy;  Laterality: N/A;   [place]   POLYPECTOMY  07/04/2016   Procedure: POLYPECTOMY;  Surgeon: [name], MD;  Location: [place];  Service: Endoscopy;;   TONSILLECTOMY      There were no vitals filed for this visit.   Subjective Assessment - 02/21/21 1053     Subjective Pt reports he feels a "dullness" in his knees. He does not feel confident his knees will not buckle. He reports back pain currently. Pt reports walking 4-5 min at a time since last appointment.    Pertinent History Patient presents to physical therapy for unsteadiness, walking instability, fatigue. His PMH includes paroxysmal a fib, MS, lymphedema, hypothyroidism, Graves Disease,  arthritis, lumbar herniated disc (L5), depression and neuropathy. Patient first developed symptoms of MS in 2005 and was diagnosed in 2006. Patient has done PT in the past, but hasn't been seen since 2020 at [facility]. Has not been doing his exercises in the past year. Walk outside to garage to ride lawnmower, walks in house. 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 Yes    Pain Location Back              INTERVENTIONS  Standing in // bars: CGA provided throughout 6" step toe taps 12x each LE -- added 4# AW on BLEs 2x10, continues to be very challenging LLE Seated LAQ with 4# on RLE, no weight on LLE - 3x10 RLE, LLE 2x10, 1x6; pt rates medium RLE and difficult LLE PT instructs pt in breathing technique (to prevent pt holding breath with exercise). Partial ROM on LLE  Forward/backward amb. In // bars 8x with BUE support, to promote LE endurance and increased glute activation with retro-stepping.   Standing with CGA next to support surface:   Airex pad: static stand 60-90 second bouts; continued trembling of ankles/LE's; tends to lose balance to L side, requires intermittent UE support to regain balance/prevent fall  Airex pad: horizontal head turns  10x each direction ; intermittent UE support  Airex pad: vertical head turns 10x, improvement from previous session, no UE support.   Airex pad with EC WBOS 60 sec, intermittent UE support    Pt educated throughout session about proper posture and technique with exercises. Improved exercise technique, movement at target joints, use of target muscles after min to mod verbal, visual, tactile cues.     PT Education - 02/21/21 1148     Education Details exercise technique, body mechanics    Person(s) Educated Patient    Methods Explanation;Tactile cues;Demonstration;Verbal cues    Comprehension Returned demonstration;Verbalized understanding              PT Short  Term Goals - 01/31/21 1419       PT SHORT TERM GOAL #1   Title Patient will be independent in home exercise program to improve strength/mobility for better functional independence with ADLs.    Baseline 8/9: HEP to be given next session    Time 4    Period Weeks    Status New    Target Date 02/28/21               PT Long Term Goals - 01/31/21 1420       PT LONG TERM GOAL #1   Title Patient will increase FOTO score to equal to or greater than 70%    to demonstrate statistically significant improvement in mobility and quality of life.    Baseline 8/9: 64%; risk adjusted 34%    Time 12    Period Weeks    Status New    Target Date 04/25/21      PT LONG TERM GOAL #2   Title Patient will increase Berg Balance score by > 6 points to demonstrate decreased fall risk during functional activities.    Baseline 8/9: perform next session    Time 12    Period Weeks    Status New    Target Date 04/25/21      PT LONG TERM GOAL #3   Title Patient will increase 10 meter walk test to >1.40ms as to improve gait speed for better community ambulation and to reduce fall risk.    Baseline 8/9: 0.65 m/s with rollator    Time 12    Period Weeks    Status New    Target Date 04/25/21      PT LONG TERM GOAL #4   Title Patient will increase six minute walk test distance to >1000 for progression to community ambulator and improve gait ability    Baseline 8/9: 505 ft with rollator    Time 12    Period Weeks    Status New    Target Date 04/25/21                   Plan - 02/21/21 1918     Clinical Impression Statement Instructed pt in breathing technique with exercise this session, as pt with tendency to hold breath. Instructed pt to exhale with concentric component of exercise, inhale at rest/with eccentric quick phase. Pt most challenged with LAQ and step-ups with LLE this session, indicating difficulty with exercises and quick onset of fatigue of LLE. The pt will benefit from  further skilled PT to improve strength, endurance, gait, balance and mobility.    Personal Factors and Comorbidities Age;Comorbidity 3+;Fitness;Past/Current Experience;Time since onset of injury/illness/exacerbation    Comorbidities aroxysmal a fib, MS, lymphedema, hypothyroidism, Graves Disease, arthritis, lumbar herniated disc (L5), depression and  neuropathy    Examination-Activity Limitations Bed Mobility;Bend;Caring for Others;Carry;Reach Overhead;Locomotion Level;Lift;Hygiene/Grooming;Dressing;Squat;Stairs;Stand;Toileting;Transfers    Examination-Participation Restrictions Cleaning;Community Activity;Laundry;Occupation;Shop;Volunteer;Yard Work    Merchant navy officer Evolving/Moderate complexity    Rehab Potential Fair    PT Frequency 2x / week    PT Duration 12 weeks    PT Treatment/Interventions ADLs/Self Care Home Management;Aquatic Therapy;Canalith Repostioning;Biofeedback;Cryotherapy;Ultrasound;Traction;Moist Heat;Electrical Stimulation;DME Instruction;Gait training;Therapeutic exercise;Therapeutic activities;Functional mobility training;Stair training;Balance training;Neuromuscular re-education;Patient/family education;Manual techniques;Orthotic Fit/Training;Compression bandaging;Passive range of motion;Vestibular;Taping;Splinting;Energy conservation;Dry needling;Visual/perceptual remediation/compensation    PT Next Visit Plan strengthening, balance, reducing L knee hyperextension; continue with floor transfers training and related strengthening    PT Home Exercise Plan No updates    Consulted and Agree with Plan of Care Patient             Patient will benefit from skilled therapeutic intervention in order to improve the following deficits and impairments:  Abnormal gait, Cardiopulmonary status limiting activity, Decreased activity tolerance, Decreased coordination, Decreased balance, Decreased endurance, Decreased mobility, Decreased strength, Decreased range of motion,  Difficulty walking, Impaired flexibility, Impaired sensation, Postural dysfunction, Improper body mechanics  Visit Diagnosis: Other abnormalities of gait and mobility  Muscle weakness (generalized)  Unsteadiness on feet  Other lack of coordination     Problem List Patient Active Problem List   Diagnosis Date Noted   Persistent atrial fibrillation (HCC)    Graves disease    Abdominal bloating    Atrial fibrillation with RVR ([place]) 10/01/2019   Hyperthyroidism    Atrial fibrillation with rapid ventricular response ([place]) 09/30/2019   Leg edema    Left leg cellulitis    Special screening for malignant neoplasms, colon    Polyp of sigmoid colon    Benign neoplasm of ascending colon    Rectal polyp    Herniated nucleus pulposus, L5-S1 11/09/2015   Low back pain 10/25/2015   Herniated nucleus pulposus, C5-6 right 10/06/2015   Dysesthesia 09/16/2015   Spasticity 09/16/2015   Unsteady gait 09/16/2015   Multiple sclerosis ([place]) 06/08/2011   Ricard Dillon PT, DPT 02/21/2021, 7:22 PM  [facility] [address] [place], [place], [address] Phone: [phone]   Fax:  [phone]  Name: Dustin Gray MRN: [id number] Date of Birth: 09-07-1955

## 2021-02-23 ENCOUNTER — Other Ambulatory Visit: Payer: Self-pay

## 2021-02-23 ENCOUNTER — Ambulatory Visit: Payer: Medicare PPO | Attending: Neurology

## 2021-02-23 DIAGNOSIS — R278 Other lack of coordination: Secondary | ICD-10-CM | POA: Insufficient documentation

## 2021-02-23 DIAGNOSIS — R2681 Unsteadiness on feet: Secondary | ICD-10-CM | POA: Diagnosis not present

## 2021-02-23 DIAGNOSIS — R2689 Other abnormalities of gait and mobility: Secondary | ICD-10-CM | POA: Diagnosis not present

## 2021-02-23 DIAGNOSIS — M6281 Muscle weakness (generalized): Secondary | ICD-10-CM | POA: Diagnosis not present

## 2021-02-23 NOTE — Therapy (Signed)
[place] MAIN [facility] SERVICES [address] [place], [place], [address] Phone: [phone]   Fax:  [phone]  Physical Therapy Treatment  Patient Details  Name: MARSHAUN MARRESE MRN: [id number] Date of Birth: [birth year] No data recorded  Encounter Date: 02/23/2021   PT End of Session - 02/23/21 1437     Visit Number 5    Number of Visits 24    Date for PT Re-Evaluation 04/25/21    Authorization Type [employer]    Authorization Time Period 01/31/21-04/25/21    PT Start Time 1351    PT Stop Time 1430    PT Time Calculation (min) 39 min    Equipment Utilized During Treatment Gait belt    Activity Tolerance Patient tolerated treatment well    Behavior During Therapy WFL for tasks assessed/performed             Past Medical History:  Diagnosis Date   Abscess    groin   Arthritis    lower spine   Erectile dysfunction    Low testosterone    Lumbar herniated disc    L5   Multiple sclerosis ([place])    Staph aureus infection     Past Surgical History:  Procedure Laterality Date   COLONOSCOPY WITH PROPOFOL N/A 07/04/2016   Procedure: COLONOSCOPY WITH PROPOFOL;  Surgeon: [name], MD;  Location: [place];  Service: Endoscopy;  Laterality: N/A;   [place]   POLYPECTOMY  07/04/2016   Procedure: POLYPECTOMY;  Surgeon: [name], MD;  Location: [place];  Service: Endoscopy;;   TONSILLECTOMY      There were no vitals filed for this visit.   Subjective Assessment - 02/23/21 1435     Subjective Patient reports he gets occasional discomfort in shoulder when walking with shopping cart. Was able to walk short distance into hospital from car without an AD because his rollator doesn't fit in his car.    Pertinent History Patient presents to physical therapy for unsteadiness, walking instability, fatigue. His PMH includes paroxysmal a fib, MS, lymphedema, hypothyroidism, Graves  Disease, arthritis, lumbar herniated disc (L5), depression and neuropathy. Patient first developed symptoms of MS in 2005 and was diagnosed in 2006. Patient has done PT in the past, but hasn't been seen since 2020 at [facility]. Has not been doing his exercises in the past year. Walk outside to garage to ride lawnmower, walks in house. 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 a couple hours    How long can you stand comfortably? no more than 10 minutes before pain, have to steady self immediately    How long can you walk comfortably? has to use rollator. can walk in a store with a cart    Patient Stated Goals want to get stronger and feel mor comfortable walking around.    Currently in Pain? Yes    Pain Score 1     Pain Location Back    Pain Descriptors / Indicators Aching    Pain Type Chronic pain    Pain Onset More than a month ago    Pain Frequency Intermittent                    Neuro Re-ed: cues for safety awareness with close CGA   Airex  pad sandwhich 6 in step 10x each direction; challenging to the L side.   Airex pad weighted ball: (3000 gr) chest press 10x, straight arm raise 10x   TherEx: cues for body mechanics and sequencing  TKE 14x LLE with BUE support  Walking forward lunges modified 6x length of // bars.  STS with weighted ball (3000 gr) press  toy soldiers 2x length of // bars  GTB lateral steps 4x length of // bars; very challenging for LLE Scapular retraction with stimulus provided between scapula 3x3 second holds  Donning sock and shoe with min/mod A for sock bilaterally.    Pt educated throughout session about proper posture and technique with exercises. Improved exercise technique, movement at target joints, use of target muscles after min to mod verbal, visual, tactile cues.     Patient is highly motivated  throughout physical therapy session. His LLE continues to be weaker than his right requiring additional cueing and techniques for reduction of knee hyperextension. Patient cued for breathing frequently throughout session. The pt will benefit from further skilled PT to improve strength, endurance, gait, balance and mobility         PT Education - 02/23/21 1437     Education Details exercise technique, body mechanics    Person(s) Educated Patient    Methods Explanation;Demonstration;Tactile cues;Verbal cues    Comprehension Verbalized understanding;Returned demonstration;Verbal cues required;Tactile cues required              PT Short Term Goals - 01/31/21 1419       PT SHORT TERM GOAL #1   Title Patient will be independent in home exercise program to improve strength/mobility for better functional independence with ADLs.    Baseline 8/9: HEP to be given next session    Time 4    Period Weeks    Status New    Target Date 02/28/21               PT Long Term Goals - 01/31/21 1420       PT LONG TERM GOAL #1   Title Patient will increase FOTO score to equal to or greater than 70%    to demonstrate statistically significant improvement in mobility and quality of life.    Baseline 8/9: 64%; risk adjusted 34%    Time 12    Period Weeks    Status New    Target Date 04/25/21      PT LONG TERM GOAL #2   Title Patient will increase Berg Balance score by > 6 points to demonstrate decreased fall risk during functional activities.    Baseline 8/9: perform next session    Time 12    Period Weeks    Status New    Target Date 04/25/21      PT LONG TERM GOAL #3   Title Patient will increase 10 meter walk test to >1.41ms as to improve gait speed for better community ambulation and to reduce fall risk.    Baseline 8/9: 0.65 m/s with rollator    Time 12    Period Weeks    Status New    Target Date 04/25/21      PT LONG TERM GOAL #4   Title Patient will increase six minute  walk test distance to >1000 for progression to community ambulator and improve gait ability    Baseline 8/9: 505 ft with rollator    Time 12    Period Weeks    Status New    Target  Date 04/25/21                   Plan - 02/23/21 1438     Clinical Impression Statement Patient is highly motivated throughout physical therapy session. His LLE continues to be weaker than his right requiring additional cueing and techniques for reduction of knee hyperextension. Patient cued for breathing frequently throughout session. The pt will benefit from further skilled PT to improve strength, endurance, gait, balance and mobility    Personal Factors and Comorbidities Age;Comorbidity 3+;Fitness;Past/Current Experience;Time since onset of injury/illness/exacerbation    Comorbidities aroxysmal a fib, MS, lymphedema, hypothyroidism, Graves Disease, arthritis, lumbar herniated disc (L5), depression and neuropathy    Examination-Activity Limitations Bed Mobility;Bend;Caring for Others;Carry;Reach Overhead;Locomotion Level;Lift;Hygiene/Grooming;Dressing;Squat;Stairs;Stand;Toileting;Transfers    Examination-Participation Restrictions Cleaning;Community Activity;Laundry;Occupation;Shop;Volunteer;Yard Work    [employer]chant [employer] officer Evolving/Moderate complexity    Rehab Potential Fair    PT Frequency 2x / week    PT Duration 12 weeks    PT Treatment/Interventions ADLs/Self Care Home Management;Aquatic Therapy;Canalith Repostioning;Biofeedback;Cryotherapy;Ultrasound;Traction;Moist Heat;Electrical Stimulation;DME Instruction;Gait training;Therapeutic exercise;Therapeutic activities;Functional mobility training;Stair training;Balance training;Neuromuscular re-education;Patient/family education;Manual techniques;Orthotic Fit/Training;Compression bandaging;Passive range of motion;Vestibular;Taping;Splinting;Energy conservation;Dry needling;Visual/perceptual remediation/compensation    PT Next Visit Plan  strengthening, balance, reducing L knee hyperextension; continue with floor transfers training and related strengthening    PT Home Exercise Plan No updates    Consulted and Agree with Plan of Care Patient             Patient will benefit from skilled therapeutic intervention in order to improve the following deficits and impairments:  Abnormal gait, Cardiopulmonary status limiting activity, Decreased activity tolerance, Decreased coordination, Decreased balance, Decreased endurance, Decreased mobility, Decreased strength, Decreased range of motion, Difficulty walking, Impaired flexibility, Impaired sensation, Postural dysfunction, Improper body mechanics  Visit Diagnosis: Other abnormalities of gait and mobility  Muscle weakness (generalized)  Unsteadiness on feet     Problem List Patient Active Problem List   Diagnosis Date Noted   Persistent atrial fibrillation (H[facility])    Graves disease    Abdominal bloating    Atrial fibrillation with RVR ([place]) 10/01/2019   Hyperthyroidism    Atrial fibrillation with rapid ventricular response ([place]) 09/30/2019   Leg edema    Left leg cellulitis    Special screening for malignant neoplasms, colon    Polyp of sigmoid colon    Benign neoplasm of ascending colon    Rectal polyp    Herniated nucleus pulposus, L5-S1 11/09/2015   Low back pain 10/25/2015   Herniated nucleus pulposus, C5-6 right 10/06/2015   Dysesthesia 09/16/2015   Spasticity 09/16/2015   Unsteady gait 09/16/2015   Multiple sclerosis ([place]) 06/08/2011    Janna Arch, PT, DPT  02/23/2021, 2:40 PM  [place] [facility] MAIN [facility] SERVICES [address] [place], [place], [address] Phone: [phone]   Fax:  [phone]  Name: DMIR RIEF MRN: [id number] Date of Birth: 11/08/1955

## 2021-02-28 ENCOUNTER — Ambulatory Visit: Payer: Medicare PPO

## 2021-02-28 ENCOUNTER — Other Ambulatory Visit: Payer: Self-pay

## 2021-02-28 DIAGNOSIS — R2689 Other abnormalities of gait and mobility: Secondary | ICD-10-CM

## 2021-02-28 DIAGNOSIS — R2681 Unsteadiness on feet: Secondary | ICD-10-CM | POA: Diagnosis not present

## 2021-02-28 DIAGNOSIS — R278 Other lack of coordination: Secondary | ICD-10-CM | POA: Diagnosis not present

## 2021-02-28 DIAGNOSIS — M6281 Muscle weakness (generalized): Secondary | ICD-10-CM | POA: Diagnosis not present

## 2021-02-28 NOTE — Therapy (Signed)
[place] MAIN [facility] SERVICES [address] [place], [place], [address] Phone: [phone]   Fax:  [phone]  Physical Therapy Treatment  Patient Details  Name: Dustin Gray MRN: [id number] Date of Birth: 21-Aug-1955 No data recorded  Encounter Date: 02/28/2021   PT End of Session - 02/28/21 1442     Visit Number 6    Number of Visits 24    Date for PT Re-Evaluation 04/25/21    Authorization Type [employer]    Authorization Time Period 01/31/21-04/25/21    PT Start Time 1345    PT Stop Time 1430    PT Time Calculation (min) 45 min    Equipment Utilized During Treatment Gait belt    Activity Tolerance Patient tolerated treatment well    Behavior During Therapy WFL for tasks assessed/performed             Past Medical History:  Diagnosis Date   Abscess    groin   Arthritis    lower spine   Erectile dysfunction    Low testosterone    Lumbar herniated disc    L5   Multiple sclerosis ([place])    Staph aureus infection     Past Surgical History:  Procedure Laterality Date   COLONOSCOPY WITH PROPOFOL N/A 07/04/2016   Procedure: COLONOSCOPY WITH PROPOFOL;  Surgeon: [name], MD;  Location: [place];  Service: Endoscopy;  Laterality: N/A;   [place]   POLYPECTOMY  07/04/2016   Procedure: POLYPECTOMY;  Surgeon: [name], MD;  Location: [place];  Service: Endoscopy;;   TONSILLECTOMY      There were no vitals filed for this visit.   Subjective Assessment - 02/28/21 1441     Subjective Patient reports no falls or LOB since last session. Can't find a place to perform the TKE exercise at home with.    Pertinent History Patient presents to physical therapy for unsteadiness, walking instability, fatigue. His PMH includes paroxysmal a fib, MS, lymphedema, hypothyroidism, Graves Disease, arthritis, lumbar herniated disc (L5), depression and neuropathy. Patient first  developed symptoms of MS in 2005 and was diagnosed in 2006. Patient has done PT in the past, but hasn't been seen since 2020 at [facility]. Has not been doing his exercises in the past year. Walk outside to garage to ride lawnmower, walks in house. 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 a couple hours    How long can you stand comfortably? no more than 10 minutes before pain, have to steady self immediately    How long can you walk comfortably? has to use rollator. can walk in a store with a cart    Patient Stated Goals want to get stronger and feel mor comfortable walking around.    Currently in Pain? No/denies                 Neuro Re-ed: cues for safety awareness with close CGA   orange hurdle lateral step over 10x each direction, cues for reducing knee hyperextension Orange hurdle forward step over and back BUE support, 10x each LE  cue for reduction of L knee hyperextension    Airex pad weighted ball: (3000 gr) chest press 10x, straight arm raise 10x   Airex pad: 6" step modified tandem stance 2x 30 seconds  holds, challenging for LLE   Hedgehog taps, SUE support for LLE stabilization no UE support for RLE stabilization 8x each LE   Three way hedgehog taps : forward, lateral, backwards, UE support 10x each LE    TherEx: cues for body mechanics and sequencing  TKE 15x LLE with BUE support  High knees 2x length of // bars   Pt educated throughout session about proper posture and technique with exercises. Improved exercise technique, movement at target joints, use of target muscles after min to mod verbal, visual, tactile cues.     Patient is very motivated throughout physical therapy session. He is challenged with LLE stabilization frequently hyperextending upon weight acceptance of limb. Hip flexion of LLE is limited with fatigue with  compensatory strategies noted with prolonged recruitment. The pt will benefit from further skilled PT to improve strength, endurance, gait, balance and mobility                    PT Education - 02/28/21 1441     Education Details exercise technique, body mechanics    Person(s) Educated Patient    Methods Explanation;Demonstration;Verbal cues;Tactile cues    Comprehension Verbalized understanding;Returned demonstration;Verbal cues required;Tactile cues required              PT Short Term Goals - 01/31/21 1419       PT SHORT TERM GOAL #1   Title Patient will be independent in home exercise program to improve strength/mobility for better functional independence with ADLs.    Baseline 8/9: HEP to be given next session    Time 4    Period Weeks    Status New    Target Date 02/28/21               PT Long Term Goals - 01/31/21 1420       PT LONG TERM GOAL #1   Title Patient will increase FOTO score to equal to or greater than 70%    to demonstrate statistically significant improvement in mobility and quality of life.    Baseline 8/9: 64%; risk adjusted 34%    Time 12    Period Weeks    Status New    Target Date 04/25/21      PT LONG TERM GOAL #2   Title Patient will increase Berg Balance score by > 6 points to demonstrate decreased fall risk during functional activities.    Baseline 8/9: perform next session    Time 12    Period Weeks    Status New    Target Date 04/25/21      PT LONG TERM GOAL #3   Title Patient will increase 10 meter walk test to >1.25ms as to improve gait speed for better community ambulation and to reduce fall risk.    Baseline 8/9: 0.65 m/s with rollator    Time 12    Period Weeks    Status New    Target Date 04/25/21      PT LONG TERM GOAL #4   Title Patient will increase six minute walk test distance to >1000 for progression to community ambulator and improve gait ability    Baseline 8/9: 505 ft with rollator    Time 12     Period Weeks    Status New    Target Date 04/25/21                   Plan - 02/28/21 1443     Clinical Impression  Statement Patient is very motivated throughout physical therapy session. He is challenged with LLE stabilization frequently hyperextending upon weight acceptance of limb. Hip flexion of LLE is limited with fatigue with compensatory strategies noted with prolonged recruitment. The pt will benefit from further skilled PT to improve strength, endurance, gait, balance and mobility    Personal Factors and Comorbidities Age;Comorbidity 3+;Fitness;Past/Current Experience;Time since onset of injury/illness/exacerbation    Comorbidities aroxysmal a fib, MS, lymphedema, hypothyroidism, Graves Disease, arthritis, lumbar herniated disc (L5), depression and neuropathy    Examination-Activity Limitations Bed Mobility;Bend;Caring for Others;Carry;Reach Overhead;Locomotion Level;Lift;Hygiene/Grooming;Dressing;Squat;Stairs;Stand;Toileting;Transfers    Examination-Participation Restrictions Cleaning;Community Activity;Laundry;Occupation;Shop;Volunteer;Yard Work    [employer]chant [employer] officer Evolving/Moderate complexity    Rehab Potential Fair    PT Frequency 2x / week    PT Duration 12 weeks    PT Treatment/Interventions ADLs/Self Care Home Management;Aquatic Therapy;Canalith Repostioning;Biofeedback;Cryotherapy;Ultrasound;Traction;Moist Heat;Electrical Stimulation;DME Instruction;Gait training;Therapeutic exercise;Therapeutic activities;Functional mobility training;Stair training;Balance training;Neuromuscular re-education;Patient/family education;Manual techniques;Orthotic Fit/Training;Compression bandaging;Passive range of motion;Vestibular;Taping;Splinting;Energy conservation;Dry needling;Visual/perceptual remediation/compensation    PT Next Visit Plan strengthening, balance, reducing L knee hyperextension; continue with floor transfers training and related strengthening    PT  Home Exercise Plan No updates    Consulted and Agree with Plan of Care Patient             Patient will benefit from skilled therapeutic intervention in order to improve the following deficits and impairments:  Abnormal gait, Cardiopulmonary status limiting activity, Decreased activity tolerance, Decreased coordination, Decreased balance, Decreased endurance, Decreased mobility, Decreased strength, Decreased range of motion, Difficulty walking, Impaired flexibility, Impaired sensation, Postural dysfunction, Improper body mechanics  Visit Diagnosis: Other abnormalities of gait and mobility  Muscle weakness (generalized)  Unsteadiness on feet     Problem List Patient Active Problem List   Diagnosis Date Noted   Persistent atrial fibrillation (H[facility])    Graves disease    Abdominal bloating    Atrial fibrillation with RVR ([place]) 10/01/2019   Hyperthyroidism    Atrial fibrillation with rapid ventricular response ([place]) 09/30/2019   Leg edema    Left leg cellulitis    Special screening for malignant neoplasms, colon    Polyp of sigmoid colon    Benign neoplasm of ascending colon    Rectal polyp    Herniated nucleus pulposus, L5-S1 11/09/2015   Low back pain 10/25/2015   Herniated nucleus pulposus, C5-6 right 10/06/2015   Dysesthesia 09/16/2015   Spasticity 09/16/2015   Unsteady gait 09/16/2015   Multiple sclerosis ([place]) 06/08/2011    Janna Arch, PT, DPT  02/28/2021, 2:45 PM  [place] MAIN [facility] SERVICES [address] [place], [place], [address] Phone: [phone]   Fax:  [phone]  Name: JAMOND CAVEN MRN: [id number] Date of Birth: [birth year]

## 2021-03-06 ENCOUNTER — Other Ambulatory Visit: Payer: Self-pay | Admitting: *Deleted

## 2021-03-06 MED ORDER — PROPRANOLOL HCL ER 80 MG PO CP24: 80.0000 mg | ORAL_CAPSULE | Freq: Two times a day (BID) | ORAL | 2 refills | Status: DC

## 2021-03-07 ENCOUNTER — Ambulatory Visit: Payer: Medicare PPO

## 2021-03-07 ENCOUNTER — Other Ambulatory Visit: Payer: Self-pay

## 2021-03-07 DIAGNOSIS — M6281 Muscle weakness (generalized): Secondary | ICD-10-CM

## 2021-03-07 DIAGNOSIS — R2681 Unsteadiness on feet: Secondary | ICD-10-CM | POA: Diagnosis not present

## 2021-03-07 DIAGNOSIS — R278 Other lack of coordination: Secondary | ICD-10-CM | POA: Diagnosis not present

## 2021-03-07 DIAGNOSIS — R2689 Other abnormalities of gait and mobility: Secondary | ICD-10-CM

## 2021-03-07 NOTE — Therapy (Signed)
[place] MAIN [facility] SERVICES [address] [place], [place], [address] Phone: [phone]   Fax:  [phone]  Physical Therapy Treatment  Patient Details  Name: Dustin Gray MRN: [id number] Date of Birth: 16-Jan-1956 No data recorded  Encounter Date: 03/07/2021   PT End of Session - 03/07/21 1634     Visit Number 7    Number of Visits 24    Date for PT Re-Evaluation 04/25/21    Authorization Type [employer]    Authorization Time Period 01/31/21-04/25/21    PT Start Time 1516    PT Stop Time 1600    PT Time Calculation (min) 44 min    Equipment Utilized During Treatment Gait belt    Activity Tolerance Patient tolerated treatment well    Behavior During Therapy WFL for tasks assessed/performed             Past Medical History:  Diagnosis Date   Abscess    groin   Arthritis    lower spine   Erectile dysfunction    Low testosterone    Lumbar herniated disc    L5   Multiple sclerosis ([place])    Staph aureus infection     Past Surgical History:  Procedure Laterality Date   COLONOSCOPY WITH PROPOFOL N/A 07/04/2016   Procedure: COLONOSCOPY WITH PROPOFOL;  Surgeon: [name], MD;  Location: [place];  Service: Endoscopy;  Laterality: N/A;   [place]   POLYPECTOMY  07/04/2016   Procedure: POLYPECTOMY;  Surgeon: [name], MD;  Location: [place];  Service: Endoscopy;;   TONSILLECTOMY      There were no vitals filed for this visit.   Subjective Assessment - 03/07/21 1632     Subjective Patient reports no falls or LOB since last session.  Will be going to the beach this weekend.    Pertinent History Patient presents to physical therapy for unsteadiness, walking instability, fatigue. His PMH includes paroxysmal a fib, MS, lymphedema, hypothyroidism, Graves Disease, arthritis, lumbar herniated disc (L5), depression and neuropathy. Patient first developed symptoms of MS  in 2005 and was diagnosed in 2006. Patient has done PT in the past, but hasn't been seen since 2020 at [facility]. Has not been doing his exercises in the past year. Walk outside to garage to ride lawnmower, walks in house. 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 a couple hours    How long can you stand comfortably? no more than 10 minutes before pain, have to steady self immediately    How long can you walk comfortably? has to use rollator. can walk in a store with a cart    Patient Stated Goals want to get stronger and feel mor comfortable walking around.    Currently in Pain? No/denies                   Neuro Re-ed: cues for safety awareness with close CGA   Airex pad: 6" step modified tandem stance 2x 30 seconds holds, challenging for LLE    Three way hedgehog taps : forward, lateral, backwards, UE support 12x each LE;l cues for reducing hyperextension of LLE    Airex pad 6" step sandwhich lateral step up/down 10x each side, UE support  TherEx: cues for body mechanics and sequencing  TKE  15x LLE with BUE support  High knees 2x length of // bars lateral squat walks 4x length of // bars cues for depth of squat 6" step: forward step up/down 14x each LE; BUE support cues for reduction of hyperextension.   Pt educated throughout session about proper posture and technique with exercises. Improved exercise technique, movement at target joints, use of target muscles after min to mod verbal, visual, tactile cues.     Patient is highly motivated throughout physical therapy session. Hyperextension of LLE is significantly decreased this session indicating improved LE strength and focused intervention. Patient educated on pelvic floor therapy availability for urinary frequency. The pt will benefit from further skilled PT to improve strength,  endurance, gait, balance and mobility                  PT Education - 03/07/21 1634     Education Details exercise technique, body mechanics    Person(s) Educated Patient    Methods Explanation;Demonstration;Verbal cues;Tactile cues    Comprehension Verbalized understanding;Returned demonstration;Verbal cues required;Tactile cues required              PT Short Term Goals - 01/31/21 1419       PT SHORT TERM GOAL #1   Title Patient will be independent in home exercise program to improve strength/mobility for better functional independence with ADLs.    Baseline 8/9: HEP to be given next session    Time 4    Period Weeks    Status New    Target Date 02/28/21               PT Long Term Goals - 01/31/21 1420       PT LONG TERM GOAL #1   Title Patient will increase FOTO score to equal to or greater than 70%    to demonstrate statistically significant improvement in mobility and quality of life.    Baseline 8/9: 64%; risk adjusted 34%    Time 12    Period Weeks    Status New    Target Date 04/25/21      PT LONG TERM GOAL #2   Title Patient will increase Berg Balance score by > 6 points to demonstrate decreased fall risk during functional activities.    Baseline 8/9: perform next session    Time 12    Period Weeks    Status New    Target Date 04/25/21      PT LONG TERM GOAL #3   Title Patient will increase 10 meter walk test to >1.67ms as to improve gait speed for better community ambulation and to reduce fall risk.    Baseline 8/9: 0.65 m/s with rollator    Time 12    Period Weeks    Status New    Target Date 04/25/21      PT LONG TERM GOAL #4   Title Patient will increase six minute walk test distance to >1000 for progression to community ambulator and improve gait ability    Baseline 8/9: 505 ft with rollator    Time 12    Period Weeks    Status New    Target Date 04/25/21                   Plan - 03/07/21 1636     Clinical  Impression Statement Patient is highly motivated throughout physical therapy session. Hyperextension of LLE is significantly decreased this session indicating improved LE strength and focused intervention. Patient educated  on pelvic floor therapy availability for urinary frequency. The pt will benefit from further skilled PT to improve strength, endurance, gait, balance and mobility    Personal Factors and Comorbidities Age;Comorbidity 3+;Fitness;Past/Current Experience;Time since onset of injury/illness/exacerbation    Comorbidities aroxysmal a fib, MS, lymphedema, hypothyroidism, Graves Disease, arthritis, lumbar herniated disc (L5), depression and neuropathy    Examination-Activity Limitations Bed Mobility;Bend;Caring for Others;Carry;Reach Overhead;Locomotion Level;Lift;Hygiene/Grooming;Dressing;Squat;Stairs;Stand;Toileting;Transfers    Examination-Participation Restrictions Cleaning;Community Activity;Laundry;Occupation;Shop;Volunteer;Yard Work    [employer]chant [employer] officer Evolving/Moderate complexity    Rehab Potential Fair    PT Frequency 2x / week    PT Duration 12 weeks    PT Treatment/Interventions ADLs/Self Care Home Management;Aquatic Therapy;Canalith Repostioning;Biofeedback;Cryotherapy;Ultrasound;Traction;Moist Heat;Electrical Stimulation;DME Instruction;Gait training;Therapeutic exercise;Therapeutic activities;Functional mobility training;Stair training;Balance training;Neuromuscular re-education;Patient/family education;Manual techniques;Orthotic Fit/Training;Compression bandaging;Passive range of motion;Vestibular;Taping;Splinting;Energy conservation;Dry needling;Visual/perceptual remediation/compensation    PT Next Visit Plan strengthening, balance, reducing L knee hyperextension; continue with floor transfers training and related strengthening    PT Home Exercise Plan No updates    Consulted and Agree with Plan of Care Patient             Patient will benefit from  skilled therapeutic intervention in order to improve the following deficits and impairments:  Abnormal gait, Cardiopulmonary status limiting activity, Decreased activity tolerance, Decreased coordination, Decreased balance, Decreased endurance, Decreased mobility, Decreased strength, Decreased range of motion, Difficulty walking, Impaired flexibility, Impaired sensation, Postural dysfunction, Improper body mechanics  Visit Diagnosis: Other abnormalities of gait and mobility  Muscle weakness (generalized)  Unsteadiness on feet     Problem List Patient Active Problem List   Diagnosis Date Noted   Persistent atrial fibrillation (H[facility])    Graves disease    Abdominal bloating    Atrial fibrillation with RVR ([place]) 10/01/2019   Hyperthyroidism    Atrial fibrillation with rapid ventricular response ([place]) 09/30/2019   Leg edema    Left leg cellulitis    Special screening for malignant neoplasms, colon    Polyp of sigmoid colon    Benign neoplasm of ascending colon    Rectal polyp    Herniated nucleus pulposus, L5-S1 11/09/2015   Low back pain 10/25/2015   Herniated nucleus pulposus, C5-6 right 10/06/2015   Dysesthesia 09/16/2015   Spasticity 09/16/2015   Unsteady gait 09/16/2015   Multiple sclerosis ([place]) 06/08/2011    Janna Arch, PT, DPT  03/07/2021, 4:38 PM  [place] [facility] MAIN [facility] SERVICES [address] [place], [place], [address] Phone: [phone]   Fax:  [phone]  Name: SEBASTIN DABDOUB MRN: [id number] Date of Birth: 12-17-1955

## 2021-03-09 ENCOUNTER — Other Ambulatory Visit: Payer: Self-pay

## 2021-03-09 ENCOUNTER — Ambulatory Visit: Payer: Medicare PPO

## 2021-03-09 DIAGNOSIS — M6281 Muscle weakness (generalized): Secondary | ICD-10-CM | POA: Diagnosis not present

## 2021-03-09 DIAGNOSIS — R2681 Unsteadiness on feet: Secondary | ICD-10-CM

## 2021-03-09 DIAGNOSIS — R2689 Other abnormalities of gait and mobility: Secondary | ICD-10-CM

## 2021-03-09 DIAGNOSIS — R278 Other lack of coordination: Secondary | ICD-10-CM | POA: Diagnosis not present

## 2021-03-09 NOTE — Therapy (Signed)
[place] MAIN [facility] SERVICES [address] [place], [place], [address] Phone: [phone]   Fax:  [phone]  Physical Therapy Treatment  Patient Details  Name: Dustin Gray MRN: [id number] Date of Birth: Nov 23, 1955 No data recorded  Encounter Date: 03/09/2021   PT End of Session - 03/09/21 1609     Visit Number 8    Number of Visits 24    Date for PT Re-Evaluation 04/25/21    Authorization Type [employer]    Authorization Time Period 01/31/21-04/25/21    PT Start Time 1520    PT Stop Time 1600    PT Time Calculation (min) 40 min    Equipment Utilized During Treatment Gait belt    Activity Tolerance Patient tolerated treatment well    Behavior During Therapy WFL for tasks assessed/performed             Past Medical History:  Diagnosis Date   Abscess    groin   Arthritis    lower spine   Erectile dysfunction    Low testosterone    Lumbar herniated disc    L5   Multiple sclerosis ([place])    Staph aureus infection     Past Surgical History:  Procedure Laterality Date   COLONOSCOPY WITH PROPOFOL N/A 07/04/2016   Procedure: COLONOSCOPY WITH PROPOFOL;  Surgeon: [name], MD;  Location: [place];  Service: Endoscopy;  Laterality: N/A;   [place]   POLYPECTOMY  07/04/2016   Procedure: POLYPECTOMY;  Surgeon: [name], MD;  Location: [place];  Service: Endoscopy;;   TONSILLECTOMY      There were no vitals filed for this visit.   Subjective Assessment - 03/09/21 1522     Subjective Pt reports no changes. Pt reports he will have to go up 13 steps to get in his mother's house for weekend trip.    Pertinent History Patient presents to physical therapy for unsteadiness, walking instability, fatigue. His PMH includes paroxysmal a fib, MS, lymphedema, hypothyroidism, Graves Disease, arthritis, lumbar herniated disc (L5), depression and neuropathy. Patient first  developed symptoms of MS in 2005 and was diagnosed in 2006. Patient has done PT in the past, but hasn't been seen since 2020 at [facility]. Has not been doing his exercises in the past year. Walk outside to garage to ride lawnmower, walks in house. 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 a couple hours    How long can you stand comfortably? no more than 10 minutes before pain, have to steady self immediately    How long can you walk comfortably? has to use rollator. can walk in a store with a cart    Patient Stated Goals want to get stronger and feel mor comfortable walking around.    Currently in Pain? No/denies              Interventions   Neuro Re-ed: cues for safety awareness with close CGA    Airex pad: 6" step modified tandem stance 2x 30 seconds holds each lower extremity, continues to be just more challenging for LLE  -progressed to performing with head turns (horizontal, vertical) 10x for each in each direction; use of intermittent UE support    Three way hedgehog taps : forward and lateral, progression from bilateral upper extremity  support to unilateral upper extremity support; cues for reducing hyperextension of LLE ; pt rates medium ---progressed to cones heel taps (one in front and two to the sides); rates difficult with LLE; knocks over cones, challenging   Airex pad 6" step sandwhich lateral step up/down 12x each side, bilateral UE support  TKE lunge to promote improved motor control of left lower extremity, emphasis on avoiding knee hyperextension.  Patient 2 sets of 10 repetitions.  Fatigues on right lower extremity.    Pt educated throughout session about proper posture and technique with exercises. Improved exercise technique, movement at target joints, use of target muscles after min to mod verbal, visual, tactile cues.      Note: Portions of this document were prepared using Dragon voice recognition software and although reviewed may contain unintentional dictation errors in syntax, grammar, or spelling.           PT Education - 03/09/21 1609     Education Details exercise technique, body mechanics    Person(s) Educated Patient    Methods Explanation;Demonstration;Verbal cues    Comprehension Verbalized understanding;Returned demonstration;Need further instruction              PT Short Term Goals - 01/31/21 1419       PT SHORT TERM GOAL #1   Title Patient will be independent in home exercise program to improve strength/mobility for better functional independence with ADLs.    Baseline 8/9: HEP to be given next session    Time 4    Period Weeks    Status New    Target Date 02/28/21               PT Long Term Goals - 01/31/21 1420       PT LONG TERM GOAL #1   Title Patient will increase FOTO score to equal to or greater than 70%    to demonstrate statistically significant improvement in mobility and quality of life.    Baseline 8/9: 64%; risk adjusted 34%    Time 12    Period Weeks    Status New    Target Date 04/25/21      PT LONG TERM GOAL #2   Title Patient will increase Berg Balance score by > 6 points to demonstrate decreased fall risk during functional activities.    Baseline 8/9: perform next session    Time 12    Period Weeks    Status New    Target Date 04/25/21      PT LONG TERM GOAL #3   Title Patient will increase 10 meter walk test to >1.47ms as to improve gait speed for better community ambulation and to reduce fall risk.    Baseline 8/9: 0.65 m/s with rollator    Time 12    Period Weeks    Status New    Target Date 04/25/21      PT LONG TERM GOAL #4   Title Patient will increase six minute walk test distance to >1000 for progression to community ambulator and improve gait ability    Baseline 8/9: 505 ft with rollator    Time 12    Period Weeks     Status New    Target Date 04/25/21                   Plan - 03/09/21 1610     Clinical Impression Statement Patient exhibits good motor control of left knee hyperextension with all interventions.  However, when pt  just ambulating in parallel bars to sit for rest breaks he does show 3-4 instances of left knee hyperextension..  Patient was also able to progress to use of head turns with balance exercises today.  Patient will benefit from further skilled PT to improve strength, gait, balance, and endurance to improve ease and safety with all functional mobility.    Personal Factors and Comorbidities Age;Comorbidity 3+;Fitness;Past/Current Experience;Time since onset of injury/illness/exacerbation    Comorbidities aroxysmal a fib, MS, lymphedema, hypothyroidism, Graves Disease, arthritis, lumbar herniated disc (L5), depression and neuropathy    Examination-Activity Limitations Bed Mobility;Bend;Caring for Others;Carry;Reach Overhead;Locomotion Level;Lift;Hygiene/Grooming;Dressing;Squat;Stairs;Stand;Toileting;Transfers    Examination-Participation Restrictions Cleaning;Community Activity;Laundry;Occupation;Shop;Volunteer;Yard Work    [employer]chant [employer] officer Evolving/Moderate complexity    Rehab Potential Fair    PT Frequency 2x / week    PT Duration 12 weeks    PT Treatment/Interventions ADLs/Self Care Home Management;Aquatic Therapy;Canalith Repostioning;Biofeedback;Cryotherapy;Ultrasound;Traction;Moist Heat;Electrical Stimulation;DME Instruction;Gait training;Therapeutic exercise;Therapeutic activities;Functional mobility training;Stair training;Balance training;Neuromuscular re-education;Patient/family education;Manual techniques;Orthotic Fit/Training;Compression bandaging;Passive range of motion;Vestibular;Taping;Splinting;Energy conservation;Dry needling;Visual/perceptual remediation/compensation    PT Next Visit Plan strengthening, balance, reducing L knee hyperextension;  continue with floor transfers training and related strengthening    PT Home Exercise Plan No updates    Consulted and Agree with Plan of Care Patient             Patient will benefit from skilled therapeutic intervention in order to improve the following deficits and impairments:  Abnormal gait, Cardiopulmonary status limiting activity, Decreased activity tolerance, Decreased coordination, Decreased balance, Decreased endurance, Decreased mobility, Decreased strength, Decreased range of motion, Difficulty walking, Impaired flexibility, Impaired sensation, Postural dysfunction, Improper body mechanics  Visit Diagnosis: Other abnormalities of gait and mobility  Muscle weakness (generalized)  Other lack of coordination  Unsteadiness on feet     Problem List Patient Active Problem List   Diagnosis Date Noted   Persistent atrial fibrillation (HCC)    Graves disease    Abdominal bloating    Atrial fibrillation with RVR ([place]) 10/01/2019   Hyperthyroidism    Atrial fibrillation with rapid ventricular response ([place]) 09/30/2019   Leg edema    Left leg cellulitis    Special screening for malignant neoplasms, colon    Polyp of sigmoid colon    Benign neoplasm of ascending colon    Rectal polyp    Herniated nucleus pulposus, L5-S1 11/09/2015   Low back pain 10/25/2015   Herniated nucleus pulposus, C5-6 right 10/06/2015   Dysesthesia 09/16/2015   Spasticity 09/16/2015   Unsteady gait 09/16/2015   Multiple sclerosis ([place]) 06/08/2011    Zollie Pee, PT 03/09/2021, 4:19 PM  [place] [address] [place], [place], [address] Phone: [phone]   Fax:  [phone]  Name: Dustin Gray MRN: [id number] Date of Birth: 04-24-1956

## 2021-03-14 ENCOUNTER — Ambulatory Visit: Payer: Medicare PPO

## 2021-03-14 ENCOUNTER — Other Ambulatory Visit: Payer: Self-pay

## 2021-03-14 DIAGNOSIS — R2689 Other abnormalities of gait and mobility: Secondary | ICD-10-CM | POA: Diagnosis not present

## 2021-03-14 DIAGNOSIS — M6281 Muscle weakness (generalized): Secondary | ICD-10-CM | POA: Diagnosis not present

## 2021-03-14 DIAGNOSIS — R278 Other lack of coordination: Secondary | ICD-10-CM | POA: Diagnosis not present

## 2021-03-14 DIAGNOSIS — R2681 Unsteadiness on feet: Secondary | ICD-10-CM

## 2021-03-14 NOTE — Therapy (Signed)
[place] MAIN [facility] SERVICES [address] [place], [place], [address] Phone: [phone]   Fax:  [phone]  Physical Therapy Treatment  Patient Details  Name: ARVO EALY MRN: [id number] Date of Birth: 26-Feb-1956 No data recorded  Encounter Date: 03/14/2021   PT End of Session - 03/14/21 1116     Visit Number 9    Number of Visits 24    Date for PT Re-Evaluation 04/25/21    Authorization Type [employer]    Authorization Time Period 01/31/21-04/25/21    PT Start Time 1101    PT Stop Time 1145    PT Time Calculation (min) 44 min    Equipment Utilized During Treatment Gait belt    Activity Tolerance Patient tolerated treatment well    Behavior During Therapy WFL for tasks assessed/performed             Past Medical History:  Diagnosis Date   Abscess    groin   Arthritis    lower spine   Erectile dysfunction    Low testosterone    Lumbar herniated disc    L5   Multiple sclerosis ([place])    Staph aureus infection     Past Surgical History:  Procedure Laterality Date   COLONOSCOPY WITH PROPOFOL N/A 07/04/2016   Procedure: COLONOSCOPY WITH PROPOFOL;  Surgeon: [name], MD;  Location: [place];  Service: Endoscopy;  Laterality: N/A;   [place]   POLYPECTOMY  07/04/2016   Procedure: POLYPECTOMY;  Surgeon: [name], MD;  Location: [place];  Service: Endoscopy;;   TONSILLECTOMY      There were no vitals filed for this visit.   Subjective Assessment - 03/14/21 1115     Subjective Patient reporting he had a good time at the beach. Was able to do the 13 steps to get into mother's house.    Pertinent History Patient presents to physical therapy for unsteadiness, walking instability, fatigue. His PMH includes paroxysmal a fib, MS, lymphedema, hypothyroidism, Graves Disease, arthritis, lumbar herniated disc (L5), depression and neuropathy. Patient first developed  symptoms of MS in 2005 and was diagnosed in 2006. Patient has done PT in the past, but hasn't been seen since 2020 at [facility]. Has not been doing his exercises in the past year. Walk outside to garage to ride lawnmower, walks in house. 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 a couple hours    How long can you stand comfortably? no more than 10 minutes before pain, have to steady self immediately    How long can you walk comfortably? has to use rollator. can walk in a store with a cart    Patient Stated Goals want to get stronger and feel mor comfortable walking around.    Currently in Pain? No/denies                    Neuro Re-ed: cues for safety awareness with close CGA    Airex pad: 6" step modified tandem stance 2x 30 seconds holds, challenging for LLE    Airex balance beam:  -lateral stepping 4x length of // bars no LOB; one near LOB -tandem walking with UE support 4x length of // bars    Orange hurdle step over and back 15x each LE, med/hard  for LLE ; BUE support  Orange hurdle, lateral step over and back 15x each side; BUE support  TherEx: cues for body mechanics and sequencing  TKE 15x LLE with BUE support  4lb ankle weights: -high knee march 4x length of // bars ; decreased amplitude with fatigue with repetition. -lateral stepping 4x length of // bars cues for foot clearance -heel raise 15x  -hip extension 15x each LE   Pt educated throughout session about proper posture and technique with exercises. Improved exercise technique, movement at target joints, use of target muscles after min to mod verbal, visual, tactile cues.   Patient highly motivated throughout physical therapy session. Decreased episodes of L knee hyperextension with weightbearing noted throughout session. He is fatigued with resisted interventions  requiring occasional seated rest breaks. The pt will benefit from further skilled PT to improve strength, endurance, gait, balance and mobility                 PT Education - 03/14/21 1116     Education Details exercise technique, body mechanics    Person(s) Educated Patient    Methods Explanation;Demonstration;Tactile cues;Verbal cues    Comprehension Verbalized understanding;Returned demonstration;Verbal cues required;Tactile cues required              PT Short Term Goals - 01/31/21 1419       PT SHORT TERM GOAL #1   Title Patient will be independent in home exercise program to improve strength/mobility for better functional independence with ADLs.    Baseline 8/9: HEP to be given next session    Time 4    Period Weeks    Status New    Target Date 02/28/21               PT Long Term Goals - 01/31/21 1420       PT LONG TERM GOAL #1   Title Patient will increase FOTO score to equal to or greater than 70%    to demonstrate statistically significant improvement in mobility and quality of life.    Baseline 8/9: 64%; risk adjusted 34%    Time 12    Period Weeks    Status New    Target Date 04/25/21      PT LONG TERM GOAL #2   Title Patient will increase Berg Balance score by > 6 points to demonstrate decreased fall risk during functional activities.    Baseline 8/9: perform next session    Time 12    Period Weeks    Status New    Target Date 04/25/21      PT LONG TERM GOAL #3   Title Patient will increase 10 meter walk test to >1.77m/s as to improve gait speed for better community ambulation and to reduce fall risk.    Baseline 8/9: 0.65 m/s with rollator    Time 12    Period Weeks    Status New    Target Date 04/25/21      PT LONG TERM GOAL #4   Title Patient will increase six minute walk test distance to >1000 for progression to community ambulator and improve gait ability    Baseline 8/9: 505 ft with rollator    Time 12    Period Weeks     Status New    Target Date 04/25/21                   Plan - 03/14/21 1418     Clinical Impression Statement Patient  highly motivated throughout physical therapy session. Decreased episodes of L knee hyperextension with weightbearing noted throughout session. He is fatigued with resisted interventions requiring occasional seated rest breaks. The pt will benefit from further skilled PT to improve strength, endurance, gait, balance and mobility    Personal Factors and Comorbidities Age;Comorbidity 3+;Fitness;Past/Current Experience;Time since onset of injury/illness/exacerbation    Comorbidities aroxysmal a fib, MS, lymphedema, hypothyroidism, Graves Disease, arthritis, lumbar herniated disc (L5), depression and neuropathy    Examination-Activity Limitations Bed Mobility;Bend;Caring for Others;Carry;Reach Overhead;Locomotion Level;Lift;Hygiene/Grooming;Dressing;Squat;Stairs;Stand;Toileting;Transfers    Examination-Participation Restrictions Cleaning;Community Activity;Laundry;Occupation;Shop;Volunteer;Yard Work    Merchant [employer] officer Evolving/Moderate complexity    Rehab Potential Fair    PT Frequency 2x / week    PT Duration 12 weeks    PT Treatment/Interventions ADLs/Self Care Home Management;Aquatic Therapy;Canalith Repostioning;Biofeedback;Cryotherapy;Ultrasound;Traction;Moist Heat;Electrical Stimulation;DME Instruction;Gait training;Therapeutic exercise;Therapeutic activities;Functional mobility training;Stair training;Balance training;Neuromuscular re-education;Patient/family education;Manual techniques;Orthotic Fit/Training;Compression bandaging;Passive range of motion;Vestibular;Taping;Splinting;Energy conservation;Dry needling;Visual/perceptual remediation/compensation    PT Next Visit Plan strengthening, balance, reducing L knee hyperextension; continue with floor transfers training and related strengthening    PT Home Exercise Plan No updates    Consulted and Agree  with Plan of Care Patient             Patient will benefit from skilled therapeutic intervention in order to improve the following deficits and impairments:  Abnormal gait, Cardiopulmonary status limiting activity, Decreased activity tolerance, Decreased coordination, Decreased balance, Decreased endurance, Decreased mobility, Decreased strength, Decreased range of motion, Difficulty walking, Impaired flexibility, Impaired sensation, Postural dysfunction, Improper body mechanics  Visit Diagnosis: Other abnormalities of gait and mobility  Muscle weakness (generalized)  Unsteadiness on feet     Problem List Patient Active Problem List   Diagnosis Date Noted   Persistent atrial fibrillation (HCC)    Graves disease    Abdominal bloating    Atrial fibrillation with RVR ([place]) 10/01/2019   Hyperthyroidism    Atrial fibrillation with rapid ventricular response ([place]) 09/30/2019   Leg edema    Left leg cellulitis    Special screening for malignant neoplasms, colon    Polyp of sigmoid colon    Benign neoplasm of ascending colon    Rectal polyp    Herniated nucleus pulposus, L5-S1 11/09/2015   Low back pain 10/25/2015   Herniated nucleus pulposus, C5-6 right 10/06/2015   Dysesthesia 09/16/2015   Spasticity 09/16/2015   Unsteady gait 09/16/2015   Multiple sclerosis ([place]) 06/08/2011    Janna Arch, PT, DPT  03/14/2021, 2:19 PM  [facility] [address] [place], [place], [address] Phone: [phone]   Fax:  [phone]  Name: DEMAREON COLDWELL MRN: [id number] Date of Birth: September 15, 1955

## 2021-03-15 DIAGNOSIS — E0500 Thyrotoxicosis with diffuse goiter without thyrotoxic crisis or storm: Secondary | ICD-10-CM | POA: Diagnosis not present

## 2021-03-21 ENCOUNTER — Other Ambulatory Visit: Payer: Self-pay

## 2021-03-21 ENCOUNTER — Ambulatory Visit: Payer: Medicare PPO

## 2021-03-21 DIAGNOSIS — R2681 Unsteadiness on feet: Secondary | ICD-10-CM

## 2021-03-21 DIAGNOSIS — R278 Other lack of coordination: Secondary | ICD-10-CM | POA: Diagnosis not present

## 2021-03-21 DIAGNOSIS — M6281 Muscle weakness (generalized): Secondary | ICD-10-CM

## 2021-03-21 DIAGNOSIS — R2689 Other abnormalities of gait and mobility: Secondary | ICD-10-CM | POA: Diagnosis not present

## 2021-03-21 NOTE — Therapy (Signed)
[place] MAIN [facility] SERVICES [address] [place], [place], [address] Phone: [phone]   Fax:  [phone]  Physical Therapy Treatment Physical Therapy Progress Note   Dates of reporting period  01/31/21   to   03/21/21   Patient Details  Name: Dustin Gray MRN: [id number] Date of Birth: 04/30/1956 No data recorded  Encounter Date: 03/21/2021   PT End of Session - 03/21/21 1657     Visit Number 10    Number of Visits 24    Date for PT Re-Evaluation 04/25/21    Authorization Type [employer]    Authorization Time Period 01/31/21-04/25/21    PT Start Time 1103    PT Stop Time 1148    PT Time Calculation (min) 45 min    Equipment Utilized During Treatment Gait belt    Activity Tolerance Patient tolerated treatment well;No increased pain    Behavior During Therapy WFL for tasks assessed/performed             Past Medical History:  Diagnosis Date   Abscess    groin   Arthritis    lower spine   Erectile dysfunction    Low testosterone    Lumbar herniated disc    L5   Multiple sclerosis (H[facility])    Staph aureus infection     Past Surgical History:  Procedure Laterality Date   COLONOSCOPY WITH PROPOFOL N/A 07/04/2016   Procedure: COLONOSCOPY WITH PROPOFOL;  Surgeon: [name], MD;  Location: [place];  Service: Endoscopy;  Laterality: N/A;   [place]   POLYPECTOMY  07/04/2016   Procedure: POLYPECTOMY;  Surgeon: [name], MD;  Location: [place];  Service: Endoscopy;;   TONSILLECTOMY      There were no vitals filed for this visit.       [facility] PT Assessment - 03/21/21 0001       Observation/Other Assessments   Focus on Therapeutic Outcomes (FOTO)  45      Ambulation/Gait   Ambulation Distance (Feet) 770 Feet    Assistive device 4-wheeled walker    Gait Pattern --   stable throughout, decreaed Left foot clearance, Rt swayback to facilitate Left swing;      Berg Balance Test   Sit to Stand Able to stand  independently using hands    Standing Unsupported Able to stand safely 2 minutes    Sitting with Back Unsupported but Feet Supported on Floor or Stool Able to sit safely and securely 2 minutes    Stand to Sit Sits safely with minimal use of hands    Transfers Able to transfer safely, minor use of hands    Standing Unsupported with Eyes Closed Able to stand 10 seconds safely   Author cuts pt off at [employer] Unsupported with Feet Together Able to place feet together independently and stand 1 minute safely    From Standing, Reach Forward with Outstretched Arm Can reach confidently >25 cm (10")    From Standing Position, Pick up Object from Floor Able to pick up shoe safely and easily    From Standing Position, Turn to Look Behind Over each Shoulder Looks behind from both sides and weight shifts well    Turn 360 Degrees Able to turn 360 degrees safely but slowly    Standing Unsupported, Alternately Place Feet on Step/Stool Able to complete >2 steps/needs minimal assist    Standing Unsupported, One Foot  in [employer] balance while stepping or standing    Standing on One Leg Unable to try or needs assist to prevent fall    Total Score 42                     PT Short Term Goals - 01/31/21 1419       PT SHORT TERM GOAL #1   Title Patient will be independent in home exercise program to improve strength/mobility for better functional independence with ADLs.    Baseline 8/9: HEP to be given next session    Time 4    Period Weeks    Status New    Target Date 02/28/21               PT Long Term Goals - 03/21/21 1212       PT LONG TERM GOAL #1   Title Patient will increase FOTO score to equal to or greater than 70%    to demonstrate statistically significant improvement in mobility and quality of life.    Baseline 8/9: 64%; risk adjusted 34%; 03/21/21 FOTO: 45    Time 12    Period Weeks    Status On-going     Target Date 04/25/21      PT LONG TERM GOAL #2   Title Patient will increase Berg Balance score by > 6 points to demonstrate decreased fall risk during functional activities.    Baseline 8/9: BBT 29/56; 03/21/21: 42/56    Time 12    Period Weeks    Status Achieved    Target Date 04/25/21      PT LONG TERM GOAL #3   Title Patient will increase 10 meter walk test to >1.78m/s as to improve gait speed for better community ambulation and to reduce fall risk.    Baseline 8/9: 0.65 m/s with rollator;    Time 12    Period Weeks    Status On-going    Target Date 04/25/21      PT LONG TERM GOAL #4   Title Patient will increase six minute walk test distance to >1000 for progression to community ambulator and improve gait ability    Baseline 8/9: 505 ft with rollator; 03/21/21: 769ft c rollator    Time 12    Period Weeks    Status On-going    Target Date 04/25/21                   Plan - 03/21/21 1658     Clinical Impression Statement Pt here for 10th visit- performed objective tests/measures and FOTO survery to ascertain progress towards goals of treatment. Long term goals projected out a few more weeks, however trajectory is appropriate for achievement of goals, as pt is seen to have improved 6MWT from 500 to 753ft, BBT from 29 to 42. FOTO survery does not indicate an improvement in perception of ease in basic mobility, however at session start pt does rpeort noting improvements in his function at home despite obvious continued weakness. Pt remains cooperative, optimistic, no significant fatigue noted, but weakness does limit single limb loading in a variety of scenarios. Will continue to follow, resume with POC next session.    Personal Factors and Comorbidities Age;Comorbidity 3+;Fitness;Past/Current Experience;Time since onset of injury/illness/exacerbation    Comorbidities aroxysmal a fib, MS, lymphedema, hypothyroidism, Graves Disease, arthritis, lumbar herniated disc (L5),  depression and neuropathy    Examination-Activity Limitations Bed Mobility;Bend;Caring for Others;Carry;Reach Overhead;Locomotion Level;Lift;Hygiene/Grooming;Dressing;Squat;Stairs;Stand;Toileting;Transfers    Examination-Participation  Restrictions Cleaning;Community Activity;Laundry;Occupation;Shop;Volunteer;Yard Work    Merchant navy officer Evolving/Moderate complexity    Clinical Decision Making Moderate    Rehab Potential Fair    PT Frequency 2x / week    PT Duration 12 weeks    PT Treatment/Interventions ADLs/Self Care Home Management;Aquatic Therapy;Canalith Repostioning;Biofeedback;Cryotherapy;Ultrasound;Traction;Moist Heat;Electrical Stimulation;DME Instruction;Gait training;Therapeutic exercise;Therapeutic activities;Functional mobility training;Stair training;Balance training;Neuromuscular re-education;Patient/family education;Manual techniques;Orthotic Fit/Training;Compression bandaging;Passive range of motion;Vestibular;Taping;Splinting;Energy conservation;Dry needling;Visual/perceptual remediation/compensation    PT Next Visit Plan strengthening, balance, reducing L knee hyperextension; continue with floor transfers training and related strengthening    PT Home Exercise Plan No updates    Consulted and Agree with Plan of Care Patient             Patient will benefit from skilled therapeutic intervention in order to improve the following deficits and impairments:  Abnormal gait, Cardiopulmonary status limiting activity, Decreased activity tolerance, Decreased coordination, Decreased balance, Decreased endurance, Decreased mobility, Decreased strength, Decreased range of motion, Difficulty walking, Impaired flexibility, Impaired sensation, Postural dysfunction, Improper body mechanics  Visit Diagnosis: Other abnormalities of gait and mobility  Muscle weakness (generalized)  Unsteadiness on feet  Other lack of coordination     Problem List Patient Active  Problem List   Diagnosis Date Noted   Persistent atrial fibrillation (HCC)    Graves disease    Abdominal bloating    Atrial fibrillation with RVR ([place]) 10/01/2019   Hyperthyroidism    Atrial fibrillation with rapid ventricular response ([place]) 09/30/2019   Leg edema    Left leg cellulitis    Special screening for malignant neoplasms, colon    Polyp of sigmoid colon    Benign neoplasm of ascending colon    Rectal polyp    Herniated nucleus pulposus, L5-S1 11/09/2015   Low back pain 10/25/2015   Herniated nucleus pulposus, C5-6 right 10/06/2015   Dysesthesia 09/16/2015   Spasticity 09/16/2015   Unsteady gait 09/16/2015   Multiple sclerosis ([place]) 06/08/2011   5:06 PM, 03/21/21 Etta Grandchild, PT, DPT Physical Therapist - [place] [phone]     Morgan, PT 03/21/2021, 5:06 PM  [place] MAIN [facility] SERVICES [address] [place], [place], [address] Phone: [phone]   Fax:  [phone]  Name: DEUCE PATERNOSTER MRN: [id number] Date of Birth: Oct 30, 1955

## 2021-03-22 DIAGNOSIS — E042 Nontoxic multinodular goiter: Secondary | ICD-10-CM | POA: Diagnosis not present

## 2021-03-22 DIAGNOSIS — E0500 Thyrotoxicosis with diffuse goiter without thyrotoxic crisis or storm: Secondary | ICD-10-CM | POA: Diagnosis not present

## 2021-03-27 ENCOUNTER — Other Ambulatory Visit: Payer: Self-pay

## 2021-03-27 ENCOUNTER — Ambulatory Visit: Payer: Medicare PPO | Attending: Neurology

## 2021-03-27 DIAGNOSIS — R278 Other lack of coordination: Secondary | ICD-10-CM | POA: Insufficient documentation

## 2021-03-27 DIAGNOSIS — R2681 Unsteadiness on feet: Secondary | ICD-10-CM | POA: Diagnosis not present

## 2021-03-27 DIAGNOSIS — M6281 Muscle weakness (generalized): Secondary | ICD-10-CM

## 2021-03-27 DIAGNOSIS — R2689 Other abnormalities of gait and mobility: Secondary | ICD-10-CM | POA: Diagnosis not present

## 2021-03-27 NOTE — Therapy (Signed)
[place] MAIN [facility] SERVICES [address] [place], [place], [address] Phone: [phone]   Fax:  [phone]  Physical Therapy Treatment  Patient Details  Name: Dustin Gray MRN: [id number] Date of Birth: 1955-11-26 No data recorded  Encounter Date: 03/27/2021   PT End of Session - 03/27/21 1439     Visit Number 11    Number of Visits 24    Date for PT Re-Evaluation 04/25/21    Authorization Type [employer]    Authorization Time Period 01/31/21-04/25/21    PT Start Time 1430    PT Stop Time 1514    PT Time Calculation (min) 44 min    Equipment Utilized During Treatment Gait belt    Activity Tolerance Patient tolerated treatment well;No increased pain    Behavior During Therapy WFL for tasks assessed/performed             Past Medical History:  Diagnosis Date   Abscess    groin   Arthritis    lower spine   Erectile dysfunction    Low testosterone    Lumbar herniated disc    L5   Multiple sclerosis (HCC)    Staph aureus infection     Past Surgical History:  Procedure Laterality Date   COLONOSCOPY WITH PROPOFOL N/A 07/04/2016   Procedure: COLONOSCOPY WITH PROPOFOL;  Surgeon: [name], MD;  Location: [place];  Service: Endoscopy;  Laterality: N/A;   [place]   POLYPECTOMY  07/04/2016   Procedure: POLYPECTOMY;  Surgeon: [name], MD;  Location: [place];  Service: Endoscopy;;   TONSILLECTOMY      There were no vitals filed for this visit.   Subjective Assessment - 03/27/21 1446     Subjective Patient reports no falls or LOB since last session. Did not have a busy weekend since last session.    Pertinent History Patient presents to physical therapy for unsteadiness, walking instability, fatigue. His PMH includes paroxysmal a fib, MS, lymphedema, hypothyroidism, Graves Disease, arthritis, lumbar herniated disc (L5), depression and neuropathy. Patient first  developed symptoms of MS in 2005 and was diagnosed in 2006. Patient has done PT in the past, but hasn't been seen since 2020 at [facility]. Has not been doing his exercises in the past year. Walk outside to garage to ride lawnmower, walks in house. 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 a couple hours    How long can you stand comfortably? no more than 10 minutes before pain, have to steady self immediately    How long can you walk comfortably? has to use rollator. can walk in a store with a cart    Patient Stated Goals want to get stronger and feel mor comfortable walking around.    Currently in Pain? No/denies                    Neuro Re-ed: cues for safety awareness with close CGA    Airex pad: 6" step modified tandem stance 2x 30 seconds holds, challenging for LLE    Airex balance beam:  -lateral stepping 6x length of // bars no LOB; one near LOB -tandem walking with UE support 6x length of // bars       TherEx: cues for body mechanics and sequencing  TKE  15x LLE with BUE support  10x STS cue for reduced episodes of L knee hyperextension   4lb ankle weights: -high knee march 6x length of // bars ; decreased amplitude with fatigue with repetition. -lateral stepping 6x length of // bars cues for foot clearance -hip extension 15x each LE   Pt educated throughout session about proper posture and technique with exercises. Improved exercise technique, movement at target joints, use of target muscles after min to mod verbal, visual, tactile cues.   Patient has decreased episodes of L knee hyperextension throughout session. Unstable surfaces continue to be an area of progression with limited ankle righting reactions with fatigue without UE support as can be seen with airex balance beam lateral stepping. Patient remains highly motivated  throughout physical therapy session. Patient will benefit from further skilled PT to improve strength, gait, balance, and endurance to improve ease and safety with all functional mobility.                 PT Education - 03/27/21 1439     Education Details exercise technique, body mechanics    Person(s) Educated Patient    Methods Explanation;Demonstration;Tactile cues;Verbal cues    Comprehension Verbalized understanding;Returned demonstration;Verbal cues required;Tactile cues required              PT Short Term Goals - 01/31/21 1419       PT SHORT TERM GOAL #1   Title Patient will be independent in home exercise program to improve strength/mobility for better functional independence with ADLs.    Baseline 8/9: HEP to be given next session    Time 4    Period Weeks    Status New    Target Date 02/28/21               PT Long Term Goals - 03/21/21 1212       PT LONG TERM GOAL #1   Title Patient will increase FOTO score to equal to or greater than 70%    to demonstrate statistically significant improvement in mobility and quality of life.    Baseline 8/9: 64%; risk adjusted 34%; 03/21/21 FOTO: 45    Time 12    Period Weeks    Status On-going    Target Date 04/25/21      PT LONG TERM GOAL #2   Title Patient will increase Berg Balance score by > 6 points to demonstrate decreased fall risk during functional activities.    Baseline 8/9: BBT 29/56; 03/21/21: 42/56    Time 12    Period Weeks    Status Achieved    Target Date 04/25/21      PT LONG TERM GOAL #3   Title Patient will increase 10 meter walk test to >1.57m/s as to improve gait speed for better community ambulation and to reduce fall risk.    Baseline 8/9: 0.65 m/s with rollator;    Time 12    Period Weeks    Status On-going    Target Date 04/25/21      PT LONG TERM GOAL #4   Title Patient will increase six minute walk test distance to >1000 for progression to community ambulator and improve  gait ability    Baseline 8/9: 505 ft with rollator; 03/21/21: 761ft c rollator    Time 12    Period Weeks    Status On-going    Target Date 04/25/21  Plan - 03/27/21 1505     Clinical Impression Statement Patient has decreased episodes of L knee hyperextension throughout session. Unstable surfaces continue to be an area of progression with limited ankle righting reactions with fatigue without UE support as can be seen with airex balance beam lateral stepping. Patient remains highly motivated throughout physical therapy session. Patient will benefit from further skilled PT to improve strength, gait, balance, and endurance to improve ease and safety with all functional mobility.    Personal Factors and Comorbidities Age;Comorbidity 3+;Fitness;Past/Current Experience;Time since onset of injury/illness/exacerbation    Comorbidities aroxysmal a fib, MS, lymphedema, hypothyroidism, Graves Disease, arthritis, lumbar herniated disc (L5), depression and neuropathy    Examination-Activity Limitations Bed Mobility;Bend;Caring for Others;Carry;Reach Overhead;Locomotion Level;Lift;Hygiene/Grooming;Dressing;Squat;Stairs;Stand;Toileting;Transfers    Examination-Participation Restrictions Cleaning;Community Activity;Laundry;Occupation;Shop;Volunteer;Yard Work    Merchant [employer] officer Evolving/Moderate complexity    Rehab Potential Fair    PT Frequency 2x / week    PT Duration 12 weeks    PT Treatment/Interventions ADLs/Self Care Home Management;Aquatic Therapy;Canalith Repostioning;Biofeedback;Cryotherapy;Ultrasound;Traction;Moist Heat;Electrical Stimulation;DME Instruction;Gait training;Therapeutic exercise;Therapeutic activities;Functional mobility training;Stair training;Balance training;Neuromuscular re-education;Patient/family education;Manual techniques;Orthotic Fit/Training;Compression bandaging;Passive range of motion;Vestibular;Taping;Splinting;Energy  conservation;Dry needling;Visual/perceptual remediation/compensation    PT Next Visit Plan strengthening, balance, reducing L knee hyperextension; continue with floor transfers training and related strengthening    PT Home Exercise Plan No updates    Consulted and Agree with Plan of Care Patient             Patient will benefit from skilled therapeutic intervention in order to improve the following deficits and impairments:  Abnormal gait, Cardiopulmonary status limiting activity, Decreased activity tolerance, Decreased coordination, Decreased balance, Decreased endurance, Decreased mobility, Decreased strength, Decreased range of motion, Difficulty walking, Impaired flexibility, Impaired sensation, Postural dysfunction, Improper body mechanics  Visit Diagnosis: Other abnormalities of gait and mobility  Muscle weakness (generalized)  Unsteadiness on feet     Problem List Patient Active Problem List   Diagnosis Date Noted   Persistent atrial fibrillation (HCC)    Graves disease    Abdominal bloating    Atrial fibrillation with RVR ([place]) 10/01/2019   Hyperthyroidism    Atrial fibrillation with rapid ventricular response ([place]) 09/30/2019   Leg edema    Left leg cellulitis    Special screening for malignant neoplasms, colon    Polyp of sigmoid colon    Benign neoplasm of ascending colon    Rectal polyp    Herniated nucleus pulposus, L5-S1 11/09/2015   Low back pain 10/25/2015   Herniated nucleus pulposus, C5-6 right 10/06/2015   Dysesthesia 09/16/2015   Spasticity 09/16/2015   Unsteady gait 09/16/2015   Multiple sclerosis ([place]) 06/08/2011    Janna Arch, PT, DPT  03/27/2021, 3:16 PM  [place] [facility] MAIN [facility] SERVICES [address] [place], [place], [address] Phone: [phone]   Fax:  [phone]  Name: Dustin Gray MRN: [id number] Date of Birth: Mar 09, 1956

## 2021-03-29 ENCOUNTER — Ambulatory Visit: Payer: Medicare PPO

## 2021-03-29 ENCOUNTER — Other Ambulatory Visit: Payer: Self-pay

## 2021-03-29 DIAGNOSIS — M6281 Muscle weakness (generalized): Secondary | ICD-10-CM | POA: Diagnosis not present

## 2021-03-29 DIAGNOSIS — R2681 Unsteadiness on feet: Secondary | ICD-10-CM

## 2021-03-29 DIAGNOSIS — R2689 Other abnormalities of gait and mobility: Secondary | ICD-10-CM

## 2021-03-29 DIAGNOSIS — R278 Other lack of coordination: Secondary | ICD-10-CM | POA: Diagnosis not present

## 2021-03-29 NOTE — Therapy (Signed)
[place] MAIN [facility] SERVICES [address] [place], [place], [address] Phone: [phone]   Fax:  [phone]  Physical Therapy Treatment  Patient Details  Name: Dustin Gray MRN: [id number] Date of Birth: [birth year] No data recorded  Encounter Date: 03/29/2021   PT End of Session - 03/29/21 1444     Visit Number 12    Number of Visits 24    Date for PT Re-Evaluation 04/25/21    Authorization Type [employer]    Authorization Time Period 01/31/21-04/25/21    PT Start Time 1435    PT Stop Time 1514    PT Time Calculation (min) 39 min    Equipment Utilized During Treatment Gait belt    Activity Tolerance Patient tolerated treatment well;No increased pain    Behavior During Therapy WFL for tasks assessed/performed             Past Medical History:  Diagnosis Date   Abscess    groin   Arthritis    lower spine   Erectile dysfunction    Low testosterone    Lumbar herniated disc    L5   Multiple sclerosis (HCC)    Staph aureus infection     Past Surgical History:  Procedure Laterality Date   COLONOSCOPY WITH PROPOFOL N/A 07/04/2016   Procedure: COLONOSCOPY WITH PROPOFOL;  Surgeon: [name], MD;  Location: [place];  Service: Endoscopy;  Laterality: N/A;   [place]   POLYPECTOMY  07/04/2016   Procedure: POLYPECTOMY;  Surgeon: [name], MD;  Location: [place];  Service: Endoscopy;;   TONSILLECTOMY      There were no vitals filed for this visit.   Subjective Assessment - 03/29/21 1442     Subjective Patient had to walk downstairs to PT gym from [place] parking due to not having a chair provided. No falls or LOB since last session.    Pertinent History Patient presents to physical therapy for unsteadiness, walking instability, fatigue. His PMH includes paroxysmal a fib, MS, lymphedema, hypothyroidism, Graves Disease, arthritis, lumbar herniated disc (L5),  depression and neuropathy. Patient first developed symptoms of MS in 2005 and was diagnosed in 2006. Patient has done PT in the past, but hasn't been seen since 2020 at [facility]. Has not been doing his exercises in the past year. Walk outside to garage to ride lawnmower, walks in house. 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 a couple hours    How long can you stand comfortably? no more than 10 minutes before pain, have to steady self immediately    How long can you walk comfortably? has to use rollator. can walk in a store with a cart    Patient Stated Goals want to get stronger and feel mor comfortable walking around.    Currently in Pain? No/denies                  Neuro Re-ed: cues for safety awareness with close CGA      Standing balance reaching inside/outside BOS batting balloon with PT guarding and SPT for perturbations, stabilization, and reaction timing. X 4 minutes   Modified single limb stance with opposite foot on soccer ball 30 seconds x 2 trials each LE, occasional UE support for re-stabilization.    orange hurdle: lateral  step over with dual task of naming fruits in alphabetical order, changed to food due to difficulty. Challenging for LLE with repetition x ~ 5 minutes ; patient reports as medium difficulty   Modified forward lunges onto bosu ball with round side up: BUE support 10x each LE    TherEx: cues for body mechanics and sequencing  10x STS cue for reduced episodes of L knee hyperextension    4" step with RTB resistance applied posteriorly step up/down 10x each LE; BUE support x 2 trials ; seated rest break between sets    Pt educated throughout session about proper posture and technique with exercises. Improved exercise technique, movement at target joints, use of target muscles after min to mod verbal, visual,  tactile cues.    Patient has increased knee hyperextension of LLE with prolonged standing and weight bearing. He is able to correct for it but is challenged with dual task. He remains highly motivated despite fatigue in standing. Patient will benefit from further skilled PT to improve strength, gait, balance, and endurance to improve ease and safety with all functional mobility.                     PT Education - 03/29/21 1443     Education Details exercise technique, dual task.    Person(s) Educated Patient    Methods Explanation;Demonstration;Tactile cues;Verbal cues    Comprehension Verbalized understanding;Returned demonstration;Tactile cues required;Verbal cues required              PT Short Term Goals - 01/31/21 1419       PT SHORT TERM GOAL #1   Title Patient will be independent in home exercise program to improve strength/mobility for better functional independence with ADLs.    Baseline 8/9: HEP to be given next session    Time 4    Period Weeks    Status New    Target Date 02/28/21               PT Long Term Goals - 03/21/21 1212       PT LONG TERM GOAL #1   Title Patient will increase FOTO score to equal to or greater than 70%    to demonstrate statistically significant improvement in mobility and quality of life.    Baseline 8/9: 64%; risk adjusted 34%; 03/21/21 FOTO: 45    Time 12    Period Weeks    Status On-going    Target Date 04/25/21      PT LONG TERM GOAL #2   Title Patient will increase Berg Balance score by > 6 points to demonstrate decreased fall risk during functional activities.    Baseline 8/9: BBT 29/56; 03/21/21: 42/56    Time 12    Period Weeks    Status Achieved    Target Date 04/25/21      PT LONG TERM GOAL #3   Title Patient will increase 10 meter walk test to >1.14m/s as to improve gait speed for better community ambulation and to reduce fall risk.    Baseline 8/9: 0.65 m/s with rollator;    Time 12    Period  Weeks    Status On-going    Target Date 04/25/21      PT LONG TERM GOAL #4   Title Patient will increase six minute walk test distance to >1000 for progression to community ambulator and improve gait ability    Baseline 8/9: 505 ft with rollator; 03/21/21: 722ft c rollator  Time 12    Period Weeks    Status On-going    Target Date 04/25/21                   Plan - 03/29/21 1446     Clinical Impression Statement Patient has increased knee hyperextension of LLE with prolonged standing and weight bearing. He is able to correct for it but is challenged with dual task. He remains highly motivated despite fatigue in standing. Patient will benefit from further skilled PT to improve strength, gait, balance, and endurance to improve ease and safety with all functional mobility.    Personal Factors and Comorbidities Age;Comorbidity 3+;Fitness;Past/Current Experience;Time since onset of injury/illness/exacerbation    Comorbidities aroxysmal a fib, MS, lymphedema, hypothyroidism, Graves Disease, arthritis, lumbar herniated disc (L5), depression and neuropathy    Examination-Activity Limitations Bed Mobility;Bend;Caring for Others;Carry;Reach Overhead;Locomotion Level;Lift;Hygiene/Grooming;Dressing;Squat;Stairs;Stand;Toileting;Transfers    Examination-Participation Restrictions Cleaning;Community Activity;Laundry;Occupation;Shop;Volunteer;Yard Work    Merchant navy officer Evolving/Moderate complexity    Rehab Potential Fair    PT Frequency 2x / week    PT Duration 12 weeks    PT Treatment/Interventions ADLs/Self Care Home Management;Aquatic Therapy;Canalith Repostioning;Biofeedback;Cryotherapy;Ultrasound;Traction;Moist Heat;Electrical Stimulation;DME Instruction;Gait training;Therapeutic exercise;Therapeutic activities;Functional mobility training;Stair training;Balance training;Neuromuscular re-education;Patient/family education;Manual techniques;Orthotic Fit/Training;Compression  bandaging;Passive range of motion;Vestibular;Taping;Splinting;Energy conservation;Dry needling;Visual/perceptual remediation/compensation    PT Next Visit Plan strengthening, balance, reducing L knee hyperextension; continue with floor transfers training and related strengthening    PT Home Exercise Plan No updates    Consulted and Agree with Plan of Care Patient             Patient will benefit from skilled therapeutic intervention in order to improve the following deficits and impairments:  Abnormal gait, Cardiopulmonary status limiting activity, Decreased activity tolerance, Decreased coordination, Decreased balance, Decreased endurance, Decreased mobility, Decreased strength, Decreased range of motion, Difficulty walking, Impaired flexibility, Impaired sensation, Postural dysfunction, Improper body mechanics  Visit Diagnosis: Other abnormalities of gait and mobility  Muscle weakness (generalized)  Unsteadiness on feet     Problem List Patient Active Problem List   Diagnosis Date Noted   Persistent atrial fibrillation (HCC)    Graves disease    Abdominal bloating    Atrial fibrillation with RVR ([place]) 10/01/2019   Hyperthyroidism    Atrial fibrillation with rapid ventricular response ([place]) 09/30/2019   Leg edema    Left leg cellulitis    Special screening for malignant neoplasms, colon    Polyp of sigmoid colon    Benign neoplasm of ascending colon    Rectal polyp    Herniated nucleus pulposus, L5-S1 11/09/2015   Low back pain 10/25/2015   Herniated nucleus pulposus, C5-6 right 10/06/2015   Dysesthesia 09/16/2015   Spasticity 09/16/2015   Unsteady gait 09/16/2015   Multiple sclerosis ([place]) 06/08/2011   Janna Arch, PT, DPT  03/29/2021, 3:15 PM  [place] [facility] MAIN [facility] SERVICES [address] [place], [place], [address] Phone: [phone]   Fax:  [phone]  Name: Dustin Gray MRN: [id number] Date of Birth:  April 20, 1956

## 2021-04-03 ENCOUNTER — Other Ambulatory Visit: Payer: Self-pay

## 2021-04-03 ENCOUNTER — Ambulatory Visit: Payer: Medicare PPO | Admitting: Physical Therapy

## 2021-04-03 DIAGNOSIS — R2681 Unsteadiness on feet: Secondary | ICD-10-CM

## 2021-04-03 DIAGNOSIS — R2689 Other abnormalities of gait and mobility: Secondary | ICD-10-CM

## 2021-04-03 DIAGNOSIS — M6281 Muscle weakness (generalized): Secondary | ICD-10-CM | POA: Diagnosis not present

## 2021-04-03 DIAGNOSIS — R278 Other lack of coordination: Secondary | ICD-10-CM

## 2021-04-03 NOTE — Therapy (Signed)
[place] MAIN [facility] SERVICES [address] [place], [place], [address] Phone: [phone]   Fax:  [phone]  Physical Therapy Treatment  Patient Details  Name: Dustin Gray MRN: [id number] Date of Birth: 16-Nov-1955 No data recorded  Encounter Date: 04/03/2021   PT End of Session - 04/03/21 1743     Visit Number 13    Number of Visits 24    Date for PT Re-Evaluation 04/25/21    Authorization Type [employer]    Authorization Time Period 01/31/21-04/25/21    PT Start Time 1433    PT Stop Time 1516    PT Time Calculation (min) 43 min    Equipment Utilized During Treatment Gait belt    Activity Tolerance Patient tolerated treatment well;No increased pain;Patient limited by fatigue    Behavior During Therapy [facility] for tasks assessed/performed             Past Medical History:  Diagnosis Date   Abscess    groin   Arthritis    lower spine   Erectile dysfunction    Low testosterone    Lumbar herniated disc    L5   Multiple sclerosis (HCC)    Staph aureus infection     Past Surgical History:  Procedure Laterality Date   COLONOSCOPY WITH PROPOFOL N/A 07/04/2016   Procedure: COLONOSCOPY WITH PROPOFOL;  Surgeon: [name], MD;  Location: [place];  Service: Endoscopy;  Laterality: N/A;   [place]   POLYPECTOMY  07/04/2016   Procedure: POLYPECTOMY;  Surgeon: [name], MD;  Location: [place];  Service: Endoscopy;;   TONSILLECTOMY      There were no vitals filed for this visit.   Subjective Assessment - 04/03/21 1436     Subjective One fall this morning - wearing Crocs and fell outside on the concrete due to LOB. He was able to stand up using UE assistance of trash can. No pain from the fall except superficial skin scrape. Reports compliance with HEP, no questions at this time.    Pertinent History Patient presents to physical therapy for unsteadiness, walking  instability, fatigue. His PMH includes paroxysmal a fib, MS, lymphedema, hypothyroidism, Graves Disease, arthritis, lumbar herniated disc (L5), depression and neuropathy. Patient first developed symptoms of MS in 2005 and was diagnosed in 2006. Patient has done PT in the past, but hasn't been seen since 2020 at [facility]. Has not been doing his exercises in the past year. Walk outside to garage to ride lawnmower, walks in house. 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 a couple hours    How long can you stand comfortably? no more than 10 minutes before pain, have to steady self immediately    How long can you walk comfortably? has to use rollator. can walk in a store with a cart    Patient Stated Goals want to get stronger and feel mor comfortable walking around.    Currently in Pain? No/denies                  Neuro Re-ed: cues for safety awareness with close CGA    Modified single limb stance with opposite foot on soccer ball 60 seconds x 2 trials each LE, occasional UE support for re-stabilization. VC for glute med activation with decreased  need for UE support. Challenge to decreased pressure through soccer ball.    Orange hurdle: lateral step over x10 each LE. Followed by dual task of naming animals in alphabetical order ~4 minutes. Challenging for LLE - to clear hurdle and during SLS on LLE, particularly with fatigue.      TherEx: cues for body mechanics and sequencing  10x STS - minimal LLE buckling noted, pt aware during exercise    6" lateral step-up with VC to "power up" through LLE. SUE support.  2x12 reps, LLE only. VC to minimize momentum pushing through RLE in order to focus on LLE quad and hip strength. Good left foot clearance onto step (hip flexion and abd) with poor LLE eccentric control.    Hip abduction - seated using GTB.  Performed 1 leg at a time to prevent RLE overpowering LLE. 2x15 each LE.    Pt educated throughout session about proper posture and technique with exercises. Improved exercise technique, movement at target joints, use of target muscles after min to mod verbal, visual, tactile cues.     Patient demo excellent motivation throughout today's session. He responds to [facility] and feedback thoughtfully and attempts to implement. Strengthening and stability exercises were continued with additional challenges in SLS and prolonged LLE weight-bearing. Due to weakness and fatigue, pt does continue to demo increased knee hyperextension of LLE. STS exercise has improved with minimal L knee hyperextension upon standing. He remains highly motivated despite fatigue in standing. Patient will benefit from further skilled PT to improve strength, gait, balance, and endurance to improve ease and safety with all functional mobility.             PT Short Term Goals - 01/31/21 1419       PT SHORT TERM GOAL #1   Title Patient will be independent in home exercise program to improve strength/mobility for better functional independence with ADLs.    Baseline 8/9: HEP to be given next session    Time 4    Period Weeks    Status New    Target Date 02/28/21               PT Long Term Goals - 03/21/21 1212       PT LONG TERM GOAL #1   Title Patient will increase FOTO score to equal to or greater than 70%    to demonstrate statistically significant improvement in mobility and quality of life.    Baseline 8/9: 64%; risk adjusted 34%; 03/21/21 FOTO: 45    Time 12    Period Weeks    Status On-going    Target Date 04/25/21      PT LONG TERM GOAL #2   Title Patient will increase Berg Balance score by > 6 points to demonstrate decreased fall risk during functional activities.    Baseline 8/9: BBT 29/56; 03/21/21: 42/56    Time 12    Period Weeks    Status Achieved    Target Date 04/25/21      PT LONG TERM  GOAL #3   Title Patient will increase 10 meter walk test to >1.35m/s as to improve gait speed for better community ambulation and to reduce fall risk.    Baseline 8/9: 0.65 m/s with rollator;    Time 12    Period Weeks    Status On-going    Target Date 04/25/21      PT LONG TERM GOAL #4   Title Patient will increase six minute walk test  distance to >1000 for progression to community ambulator and improve gait ability    Baseline 8/9: 505 ft with rollator; 03/21/21: 78ft c rollator    Time 12    Period Weeks    Status On-going    Target Date 04/25/21                   Plan - 04/03/21 1743     Clinical Impression Statement Patient demo excellent motivation throughout today's session. He responds to [facility] and feedback thoughtfully and attempts to implement. Strengthening and stability exercises were continued with additional challenges in SLS and prolonged LLE weight-bearing. Due to weakness and fatigue, pt does continue to demo increased knee hyperextension of LLE. STS exercise has improved with minimal L knee hyperextension upon standing. He remains highly motivated despite fatigue in standing. Patient will benefit from further skilled PT to improve strength, gait, balance, and endurance to improve ease and safety with all functional mobility.    Personal Factors and Comorbidities Age;Comorbidity 3+;Fitness;Past/Current Experience;Time since onset of injury/illness/exacerbation    Comorbidities aroxysmal a fib, MS, lymphedema, hypothyroidism, Graves Disease, arthritis, lumbar herniated disc (L5), depression and neuropathy    Examination-Activity Limitations Bed Mobility;Bend;Caring for Others;Carry;Reach Overhead;Locomotion Level;Lift;Hygiene/Grooming;Dressing;Squat;Stairs;Stand;Toileting;Transfers    Examination-Participation Restrictions Cleaning;Community Activity;Laundry;Occupation;Shop;Volunteer;Yard Work    Merchant [employer] officer Evolving/Moderate complexity    Rehab  Potential Fair    PT Frequency 2x / week    PT Duration 12 weeks    PT Treatment/Interventions ADLs/Self Care Home Management;Aquatic Therapy;Canalith Repostioning;Biofeedback;Cryotherapy;Ultrasound;Traction;Moist Heat;Electrical Stimulation;DME Instruction;Gait training;Therapeutic exercise;Therapeutic activities;Functional mobility training;Stair training;Balance training;Neuromuscular re-education;Patient/family education;Manual techniques;Orthotic Fit/Training;Compression bandaging;Passive range of motion;Vestibular;Taping;Splinting;Energy conservation;Dry needling;Visual/perceptual remediation/compensation    PT Next Visit Plan strengthening, balance, reducing L knee hyperextension; continue with floor transfers training and related strengthening    PT Home Exercise Plan No updates    Consulted and Agree with Plan of Care Patient             Patient will benefit from skilled therapeutic intervention in order to improve the following deficits and impairments:  Abnormal gait, Cardiopulmonary status limiting activity, Decreased activity tolerance, Decreased coordination, Decreased balance, Decreased endurance, Decreased mobility, Decreased strength, Decreased range of motion, Difficulty walking, Impaired flexibility, Impaired sensation, Postural dysfunction, Improper body mechanics  Visit Diagnosis: Other abnormalities of gait and mobility  Muscle weakness (generalized)  Unsteadiness on feet  Other lack of coordination     Problem List Patient Active Problem List   Diagnosis Date Noted   Persistent atrial fibrillation (HCC)    Graves disease    Abdominal bloating    Atrial fibrillation with RVR ([place]) 10/01/2019   Hyperthyroidism    Atrial fibrillation with rapid ventricular response ([place]) 09/30/2019   Leg edema    Left leg cellulitis    Special screening for malignant neoplasms, colon    Polyp of sigmoid colon    Benign neoplasm of ascending colon    Rectal polyp     Herniated nucleus pulposus, L5-S1 11/09/2015   Low back pain 10/25/2015   Herniated nucleus pulposus, C5-6 right 10/06/2015   Dysesthesia 09/16/2015   Spasticity 09/16/2015   Unsteady gait 09/16/2015   Multiple sclerosis ([place]) 06/08/2011    Patrina Levering PT, DPT  Ramonita Lab, PT 04/03/2021, 5:47 PM  [place] MAIN [facility] SERVICES [address] [place], [place], [address] Phone: [phone]   Fax:  [phone]  Name: Dustin Gray MRN: [id number] Date of Birth: [birth year]

## 2021-04-05 ENCOUNTER — Other Ambulatory Visit: Payer: Self-pay

## 2021-04-05 ENCOUNTER — Ambulatory Visit: Payer: Medicare PPO

## 2021-04-05 DIAGNOSIS — M6281 Muscle weakness (generalized): Secondary | ICD-10-CM

## 2021-04-05 DIAGNOSIS — R2689 Other abnormalities of gait and mobility: Secondary | ICD-10-CM | POA: Diagnosis not present

## 2021-04-05 DIAGNOSIS — R278 Other lack of coordination: Secondary | ICD-10-CM | POA: Diagnosis not present

## 2021-04-05 DIAGNOSIS — R2681 Unsteadiness on feet: Secondary | ICD-10-CM | POA: Diagnosis not present

## 2021-04-05 NOTE — Therapy (Signed)
[place] MAIN [facility] SERVICES [address] [place] [address] [place], [place], [address] Phone: [phone]   Fax:  [phone]  Physical Therapy Treatment  Patient Details  Name: Dustin Gray MRN: [id number] Date of Birth: 07-Aug-1955 No data recorded  Encounter Date: 04/05/2021   PT End of Session - 04/05/21 1527     Visit Number 14    Number of Visits 24    Date for PT Re-Evaluation 04/25/21    Authorization Type [employer]    Authorization Time Period 01/31/21-04/25/21    PT Start Time 1431    PT Stop Time 1515    PT Time Calculation (min) 44 min    Equipment Utilized During Treatment Gait belt    Activity Tolerance Patient tolerated treatment well;No increased pain;Patient limited by fatigue    Behavior During Therapy [facility] for tasks assessed/performed             Past Medical History:  Diagnosis Date   Abscess    groin   Arthritis    lower spine   Erectile dysfunction    Low testosterone    Lumbar herniated disc    L5   Multiple sclerosis (HCC)    Staph aureus infection     Past Surgical History:  Procedure Laterality Date   COLONOSCOPY WITH PROPOFOL N/A 07/04/2016   Procedure: COLONOSCOPY WITH PROPOFOL;  Surgeon: [name], MD;  Location: [place];  Service: Endoscopy;  Laterality: N/A;   [place]   POLYPECTOMY  07/04/2016   Procedure: POLYPECTOMY;  Surgeon: [name], MD;  Location: [place];  Service: Endoscopy;;   TONSILLECTOMY      There were no vitals filed for this visit.   Subjective Assessment - 04/05/21 1432     Subjective Patient reports feeling okay, no LOB or falls since last session.    Pertinent History Patient presents to physical therapy for unsteadiness, walking instability, fatigue. His PMH includes paroxysmal a fib, MS, lymphedema, hypothyroidism, Graves Disease, arthritis, lumbar herniated disc (L5), depression and neuropathy. Patient first  developed symptoms of MS in 2005 and was diagnosed in 2006. Patient has done PT in the past, but hasn't been seen since 2020 at [facility]. Has not been doing his exercises in the past year. Walk outside to garage to ride lawnmower, walks in house. 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 a couple hours    How long can you stand comfortably? no more than 10 minutes before pain, have to steady self immediately    How long can you walk comfortably? has to use rollator. can walk in a store with a cart    Patient Stated Goals want to get stronger and feel mor comfortable walking around.    Currently in Pain? No/denies    Pain Onset More than a month ago             Ther Ex: -STS x10, 3-4 count for concentric/eccentric control. Patient rates as medium difficulty. - standing hip abduction and extension w/ 3lb ankle weights x 10 each direction. Repeated without ankle weights. Significant trendelenburg noted during stance on LLE with and without weights. - 4" step taps w/ RTB anchored from posterior mid calf x 12 each side.  - STS x5, 3-4 count for concentric/eccentric control at end of session  Neuro Re-Ed: -modified tandem stance on airex with weighted ball (500gm) 5 forward chest press, 5 overhead straight arm lat raise. Repeated switching forward foot. Significantly more challenging on second set with RLE forward. Multiple lateral LOB noted with patient correcting with SUE at parallel bars. Patient rated as medium-hard. - obstacle course in parallel bars; step on airex, over hurdle, on airex with step through gait pattern. Forwards and backwards x2 switching leading leg. Repeated 2 times. Patient with good corrections throughout for foot clearance and placement. -SLS balance with hedgehog tapping  by color called out by SPT with varying time between colors  to challenge reaction times.Marland Kitchen 4 colors set up (2 in front, 2 at sides). 2x 2 minute trials. Difficulty with LE coordination noted throughout. Patient rated as medium/hard due to difficulty "coordinating legs".    Pt educated throughout session about proper posture and technique with exercises. Improved exercise technique, movement at target joints, use of target muscles after min to mod verbal, visual, tactile cues.           PT Education - 04/05/21 1526     Education Details exercise technique, dual tasking, muscles involved in hip drop, tactile cuing    Person(s) Educated Patient    Methods Explanation;Demonstration;Tactile cues;Verbal cues    Comprehension Verbalized understanding;Returned demonstration;Tactile cues required;Verbal cues required              PT Short Term Goals - 01/31/21 1419       PT SHORT TERM GOAL #1   Title Patient will be independent in home exercise program to improve strength/mobility for better functional independence with ADLs.    Baseline 8/9: HEP to be given next session    Time 4    Period Weeks    Status New    Target Date 02/28/21               PT Long Term Goals - 03/21/21 1212       PT LONG TERM GOAL #1   Title Patient will increase FOTO score to equal to or greater than 70%    to demonstrate statistically significant improvement in mobility and quality of life.    Baseline 8/9: 64%; risk adjusted 34%; 03/21/21 FOTO: 45    Time 12    Period Weeks    Status On-going    Target Date 04/25/21      PT LONG TERM GOAL #2   Title Patient will increase Berg Balance score by > 6 points to demonstrate decreased fall risk during functional activities.    Baseline 8/9: BBT 29/56; 03/21/21: 42/56    Time 12    Period Weeks    Status Achieved    Target Date 04/25/21      PT LONG TERM GOAL #3   Title Patient will increase 10 meter walk test to >1.68m/s as to improve gait speed for better community ambulation and to reduce fall risk.     Baseline 8/9: 0.65 m/s with rollator;    Time 12    Period Weeks    Status On-going    Target Date 04/25/21      PT LONG TERM GOAL #4   Title Patient will increase six minute walk test distance to >1000 for progression to community ambulator and improve gait ability    Baseline 8/9: 505 ft with rollator; 03/21/21: 766ft c rollator    Time 12    Period Weeks    Status On-going    Target Date 04/25/21  Plan - 04/05/21 1527     Clinical Impression Statement Patient with good effort and motivation throughout session. Continues to tolerate progressions of LE strengthening and balance well with increased control during STS and obstacle course manuevering. Patient with continued difficulty in SL stance on LLE with notable trendelenburg and difficulty with LE coordination during dual tasking. Patient will continue to benefit from skilled PT to further improve strength, gait, balance, coordination, and safety with ADLs.    Personal Factors and Comorbidities Age;Comorbidity 3+;Fitness;Past/Current Experience;Time since onset of injury/illness/exacerbation    Comorbidities aroxysmal a fib, MS, lymphedema, hypothyroidism, Graves Disease, arthritis, lumbar herniated disc (L5), depression and neuropathy    Examination-Activity Limitations Bed Mobility;Bend;Caring for Others;Carry;Reach Overhead;Locomotion Level;Lift;Hygiene/Grooming;Dressing;Squat;Stairs;Stand;Toileting;Transfers    Examination-Participation Restrictions Cleaning;Community Activity;Laundry;Occupation;Shop;Volunteer;Yard Work    Merchant [employer] officer Evolving/Moderate complexity    Rehab Potential Fair    PT Frequency 2x / week    PT Duration 12 weeks    PT Treatment/Interventions ADLs/Self Care Home Management;Aquatic Therapy;Canalith Repostioning;Biofeedback;Cryotherapy;Ultrasound;Traction;Moist Heat;Electrical Stimulation;DME Instruction;Gait training;Therapeutic exercise;Therapeutic  activities;Functional mobility training;Stair training;Balance training;Neuromuscular re-education;Patient/family education;Manual techniques;Orthotic Fit/Training;Compression bandaging;Passive range of motion;Vestibular;Taping;Splinting;Energy conservation;Dry needling;Visual/perceptual remediation/compensation    PT Next Visit Plan strengthening, balance, reducing L knee hyperextension; continue with floor transfers training and related strengthening; Glute strengthening and hip drop during LLE stance, LE coordination.    PT Home Exercise Plan No updates    Consulted and Agree with Plan of Care Patient             Patient will benefit from skilled therapeutic intervention in order to improve the following deficits and impairments:  Abnormal gait, Cardiopulmonary status limiting activity, Decreased activity tolerance, Decreased coordination, Decreased balance, Decreased endurance, Decreased mobility, Decreased strength, Decreased range of motion, Difficulty walking, Impaired flexibility, Impaired sensation, Postural dysfunction, Improper body mechanics  Visit Diagnosis: Muscle weakness (generalized)  Unsteadiness on feet  Other abnormalities of gait and mobility     Problem List Patient Active Problem List   Diagnosis Date Noted   Persistent atrial fibrillation (HCC)    Graves disease    Abdominal bloating    Atrial fibrillation with RVR ([place]) 10/01/2019   Hyperthyroidism    Atrial fibrillation with rapid ventricular response ([place]) 09/30/2019   Leg edema    Left leg cellulitis    Special screening for malignant neoplasms, colon    Polyp of sigmoid colon    Benign neoplasm of ascending colon    Rectal polyp    Herniated nucleus pulposus, L5-S1 11/09/2015   Low back pain 10/25/2015   Herniated nucleus pulposus, C5-6 right 10/06/2015   Dysesthesia 09/16/2015   Spasticity 09/16/2015   Unsteady gait 09/16/2015   Multiple sclerosis ([place]) 06/08/2011   Arsenio Katz,  SPT  This entire session was performed under direct supervision and direction of a licensed therapist/therapist assistant . I have personally read, edited and approve of the note as written.  Janna Arch, PT, DPT  04/05/2021, 5:10 PM  [place] MAIN [facility] SERVICES [address] [place], [place], [address] Phone: [phone]   Fax:  [phone]  Name: ANDRELL BERGESON MRN: [id number] Date of Birth: 26-Feb-1956

## 2021-04-10 ENCOUNTER — Other Ambulatory Visit: Payer: Self-pay

## 2021-04-10 ENCOUNTER — Ambulatory Visit: Payer: Medicare PPO

## 2021-04-10 DIAGNOSIS — R278 Other lack of coordination: Secondary | ICD-10-CM

## 2021-04-10 DIAGNOSIS — M6281 Muscle weakness (generalized): Secondary | ICD-10-CM | POA: Diagnosis not present

## 2021-04-10 DIAGNOSIS — R2689 Other abnormalities of gait and mobility: Secondary | ICD-10-CM

## 2021-04-10 DIAGNOSIS — R2681 Unsteadiness on feet: Secondary | ICD-10-CM | POA: Diagnosis not present

## 2021-04-10 NOTE — Therapy (Signed)
[place] MAIN [facility] SERVICES [address] [place], [place], [address] Phone: [phone]   Fax:  [phone]  Physical Therapy Treatment  Patient Details  Name: Dustin Gray MRN: [id number] Date of Birth: [birth year] No data recorded  Encounter Date: 04/10/2021   PT End of Session - 04/10/21 1533     Visit Number 15    Number of Visits 24    Date for PT Re-Evaluation 04/25/21    Authorization Type [employer]    Authorization Time Period 01/31/21-04/25/21    PT Start Time 1431    PT Stop Time 1515    PT Time Calculation (min) 44 min    Equipment Utilized During Treatment Gait belt    Activity Tolerance Patient tolerated treatment well;No increased pain;Patient limited by fatigue    Behavior During Therapy [facility] for tasks assessed/performed             Past Medical History:  Diagnosis Date   Abscess    groin   Arthritis    lower spine   Erectile dysfunction    Low testosterone    Lumbar herniated disc    L5   Multiple sclerosis (HCC)    Staph aureus infection     Past Surgical History:  Procedure Laterality Date   COLONOSCOPY WITH PROPOFOL N/A 07/04/2016   Procedure: COLONOSCOPY WITH PROPOFOL;  Surgeon: [name], MD;  Location: [place];  Service: Endoscopy;  Laterality: N/A;   [place]   POLYPECTOMY  07/04/2016   Procedure: POLYPECTOMY;  Surgeon: [name], MD;  Location: [place];  Service: Endoscopy;;   TONSILLECTOMY      There were no vitals filed for this visit.   Subjective Assessment - 04/10/21 1430     Subjective Patient reports feeling okay, no LOB or falls since last session. Didn't sleep well so feels more fatigued than normal. Denies pain.    Pertinent History Patient presents to physical therapy for unsteadiness, walking instability, fatigue. His PMH includes paroxysmal a fib, MS, lymphedema, hypothyroidism, Graves Disease, arthritis, lumbar  herniated disc (L5), depression and neuropathy. Patient first developed symptoms of MS in 2005 and was diagnosed in 2006. Patient has done PT in the past, but hasn't been seen since 2020 at [facility]. Has not been doing his exercises in the past year. Walk outside to garage to ride lawnmower, walks in house. 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 a couple hours    How long can you stand comfortably? no more than 10 minutes before pain, have to steady self immediately    How long can you walk comfortably? has to use rollator. can walk in a store with a cart    Patient Stated Goals want to get stronger and feel mor comfortable walking around.    Currently in Pain? No/denies    Pain Score 0-No pain    Pain Onset More than a month ago            Ther Ex: -STS 1x10, w/ weighted ball, rated as medium difficulty & 1x5 w/o weight working on slow, controlled movement at end of session.  - 6" step ups x10 each side, focusing on keeping soft left knee. Hyperextension noted occasionally and cuing provided to squeeze glutes.  -Ambulate 145 feet with Bariatric Rollator;  decreased stance time on left LE, decreased heel rocker bilaterally, moderate-significant trendelenburg noted.   Neuro Re-Ed: -TKE with GTB 2 x15, second set with no UE support.  -Hip Hikes from green airex pad/step x12 each side; patient rated more challenging when standing on the right LE. Hip hike standing on LLE compensated with knee flexion and extension.  -SLS with hedgehog taps lateral and forward. GTB around stance leg anchored in front of patient to promote active knee extension during LLE stance. 2x10 each side.  -modified tandem stance on airex with weighted ball (500gm) 5 forward chest press, 5 overhead straight arm lat raise. Repeated switching forward foot. Patient unable to  perform with weighted ball when LLE is in rear; activity modified to static tandem stance on airex with verbal cuing for glute activation.    Pt educated throughout session about proper posture and technique with exercises. Improved exercise technique, movement at target joints, use of target muscles after min to mod verbal, visual, tactile cues.    Patient with good effort and enthusiasm throughout session today. Patient demonstrated improved control of Left knee hyperextension during familiar functional and balance tasks as well as improved functional strength with sit to stands. Patient continues to demonstrate significant glute weakness, especially during single leg stance activities and will continue to benefit from skilled PT to address these deficits, balance, gait deviations, and independence with ADLs.       PT Education - 04/10/21 1532     Education Details Exercise technique & education on safety awareness when fatigued.    Person(s) Educated Patient    Methods Explanation;Tactile cues;Verbal cues    Comprehension Verbalized understanding;Returned demonstration;Verbal cues required              PT Short Term Goals - 01/31/21 1419       PT SHORT TERM GOAL #1   Title Patient will be independent in home exercise program to improve strength/mobility for better functional independence with ADLs.    Baseline 8/9: HEP to be given next session    Time 4    Period Weeks    Status New    Target Date 02/28/21               PT Long Term Goals - 03/21/21 1212       PT LONG TERM GOAL #1   Title Patient will increase FOTO score to equal to or greater than 70%    to demonstrate statistically significant improvement in mobility and quality of life.    Baseline 8/9: 64%; risk adjusted 34%; 03/21/21 FOTO: 45    Time 12    Period Weeks    Status On-going    Target Date 04/25/21      PT LONG TERM GOAL #2   Title Patient will increase Berg Balance score by > 6 points to  demonstrate decreased fall risk during functional activities.    Baseline 8/9: BBT 29/56; 03/21/21: 42/56    Time 12    Period Weeks    Status Achieved    Target Date 04/25/21      PT LONG TERM GOAL #3   Title Patient will increase 10 meter walk test to >1.24m/s as to improve gait speed for better community ambulation and to reduce fall risk.    Baseline 8/9: 0.65 m/s with rollator;    Time 12    Period Weeks    Status On-going    Target Date 04/25/21      PT LONG TERM  GOAL #4   Title Patient will increase six minute walk test distance to >1000 for progression to community ambulator and improve gait ability    Baseline 8/9: 505 ft with rollator; 03/21/21: 750ft c rollator    Time 12    Period Weeks    Status On-going    Target Date 04/25/21                   Plan - 04/10/21 1534     Clinical Impression Statement Patient with good effort and enthusiasm throughout session today. Patient demonstrated improved control of Left knee hyperextension during familiar functional and balance tasks as well as improved functional strength with sit to stands. Patient continues to demonstrate significant glute weakness, especially during single leg stance activities and will continue to benefit from skilled PT to address these deficits, balance, gait deviations, and independence with ADLs.    Personal Factors and Comorbidities Age;Comorbidity 3+;Fitness;Past/Current Experience;Time since onset of injury/illness/exacerbation    Comorbidities aroxysmal a fib, MS, lymphedema, hypothyroidism, Graves Disease, arthritis, lumbar herniated disc (L5), depression and neuropathy    Examination-Activity Limitations Bed Mobility;Bend;Caring for Others;Carry;Reach Overhead;Locomotion Level;Lift;Hygiene/Grooming;Dressing;Squat;Stairs;Stand;Toileting;Transfers    Examination-Participation Restrictions Cleaning;Community Activity;Laundry;Occupation;Shop;Volunteer;Yard Work    Merchant [employer] officer  Evolving/Moderate complexity    Rehab Potential Fair    PT Frequency 2x / week    PT Duration 12 weeks    PT Treatment/Interventions ADLs/Self Care Home Management;Aquatic Therapy;Canalith Repostioning;Biofeedback;Cryotherapy;Ultrasound;Traction;Moist Heat;Electrical Stimulation;DME Instruction;Gait training;Therapeutic exercise;Therapeutic activities;Functional mobility training;Stair training;Balance training;Neuromuscular re-education;Patient/family education;Manual techniques;Orthotic Fit/Training;Compression bandaging;Passive range of motion;Vestibular;Taping;Splinting;Energy conservation;Dry needling;Visual/perceptual remediation/compensation    PT Next Visit Plan Continue SL stance training and LE coordination, expand gait training, address glute strengthening/activation at [facility] table.    PT Home Exercise Plan No updates    Consulted and Agree with Plan of Care Patient             Patient will benefit from skilled therapeutic intervention in order to improve the following deficits and impairments:  Abnormal gait, Cardiopulmonary status limiting activity, Decreased activity tolerance, Decreased coordination, Decreased balance, Decreased endurance, Decreased mobility, Decreased strength, Decreased range of motion, Difficulty walking, Impaired flexibility, Impaired sensation, Postural dysfunction, Improper body mechanics  Visit Diagnosis: Muscle weakness (generalized)  Other abnormalities of gait and mobility  Other lack of coordination     Problem List Patient Active Problem List   Diagnosis Date Noted   Persistent atrial fibrillation (HCC)    Graves disease    Abdominal bloating    Atrial fibrillation with RVR ([place]) 10/01/2019   Hyperthyroidism    Atrial fibrillation with rapid ventricular response ([place]) 09/30/2019   Leg edema    Left leg cellulitis    Special screening for malignant neoplasms, colon    Polyp of sigmoid colon    Benign neoplasm of ascending colon     Rectal polyp    Herniated nucleus pulposus, L5-S1 11/09/2015   Low back pain 10/25/2015   Herniated nucleus pulposus, C5-6 right 10/06/2015   Dysesthesia 09/16/2015   Spasticity 09/16/2015   Unsteady gait 09/16/2015   Multiple sclerosis ([place]) 06/08/2011   Arsenio Katz, SPT  This entire session was performed under direct supervision and direction of a licensed therapist/therapist assistant . I have personally read, edited and approve of the note as written.  Janna Arch, PT, DPT  04/10/2021, 3:38 PM  [place] MAIN [facility] SERVICES [address] [place], [place], [address] Phone: [phone]   Fax:  [phone]  Name: DAMETRIUS SANJUAN MRN: [id number]  Date of Birth: Jun 17, 1956

## 2021-04-12 ENCOUNTER — Ambulatory Visit: Payer: Medicare PPO

## 2021-04-12 ENCOUNTER — Other Ambulatory Visit: Payer: Self-pay

## 2021-04-12 DIAGNOSIS — M6281 Muscle weakness (generalized): Secondary | ICD-10-CM

## 2021-04-12 DIAGNOSIS — R2681 Unsteadiness on feet: Secondary | ICD-10-CM

## 2021-04-12 DIAGNOSIS — R2689 Other abnormalities of gait and mobility: Secondary | ICD-10-CM | POA: Diagnosis not present

## 2021-04-12 DIAGNOSIS — R278 Other lack of coordination: Secondary | ICD-10-CM | POA: Diagnosis not present

## 2021-04-12 NOTE — Therapy (Signed)
[place] MAIN [facility] SERVICES [address] [place], [place], [address] Phone: [phone]   Fax:  [phone]  Physical Therapy Treatment  Patient Details  Name: Dustin Gray MRN: [id number] Date of Birth: [birth year] No data recorded  Encounter Date: 04/12/2021   PT End of Session - 04/12/21 1537     Visit Number 16    Number of Visits 24    Date for PT Re-Evaluation 04/25/21    Authorization Type [employer]    Authorization Time Period 01/31/21-04/25/21    PT Start Time 1431    PT Stop Time 1515    PT Time Calculation (min) 44 min    Equipment Utilized During Treatment Gait belt    Activity Tolerance Patient tolerated treatment well;No increased pain;Patient limited by fatigue    Behavior During Therapy [facility] for tasks assessed/performed             Past Medical History:  Diagnosis Date   Abscess    groin   Arthritis    lower spine   Erectile dysfunction    Low testosterone    Lumbar herniated disc    L5   Multiple sclerosis (HCC)    Staph aureus infection     Past Surgical History:  Procedure Laterality Date   COLONOSCOPY WITH PROPOFOL N/A 07/04/2016   Procedure: COLONOSCOPY WITH PROPOFOL;  Surgeon: [name], MD;  Location: [place];  Service: Endoscopy;  Laterality: N/A;   [place]   POLYPECTOMY  07/04/2016   Procedure: POLYPECTOMY;  Surgeon: [name], MD;  Location: [place];  Service: Endoscopy;;   TONSILLECTOMY      There were no vitals filed for this visit.   Subjective Assessment - 04/12/21 1432     Subjective Patient reports not feeling as fatigued this morning, but is feeling a little stressed. Denies pain out of the ordinary.    Pertinent History Patient presents to physical therapy for unsteadiness, walking instability, fatigue. His PMH includes paroxysmal a fib, MS, lymphedema, hypothyroidism, Graves Disease, arthritis, lumbar herniated  disc (L5), depression and neuropathy. Patient first developed symptoms of MS in 2005 and was diagnosed in 2006. Patient has done PT in the past, but hasn't been seen since 2020 at [facility]. Has not been doing his exercises in the past year. Walk outside to garage to ride lawnmower, walks in house. 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 a couple hours    How long can you stand comfortably? no more than 10 minutes before pain, have to steady self immediately    How long can you walk comfortably? has to use rollator. can walk in a store with a cart    Patient Stated Goals want to get stronger and feel mor comfortable walking around.    Currently in Pain? No/denies    Pain Score 0-No pain    Pain Onset More than a month ago           Ther Ex:  Seated: - LAQs with weights or ball between ankles 2x10 working on good upright posture.  Supine: Active hip abduction 1x10 no weights, added 2.5# ankle weights and repeated until fatigue 1x30 on RLE, 1x15 on LLE. Patient rated as easy on RLE, medium on LLE. Bridges > 3 way bridges (ball squeeze for  adduction, banded for abduction) Adduction patient rates as easy 1x15 with 3 sec hold at top. Abduction with GTB, patient rates as easy. 1x15 1x15 with 3 pulses at top, slow and controlled movement Patient reported feeling fatigue in low back, so cuing was added to contract abdominals, drive through heels, and squeeze the glutes at the top. Repeated 1x15 with this cuing. Modified deadbugs isometric push and hold 5 seconds into silver bosu ball 1x12. Modified dead bug with isometric push and alternating arm extension 1x10.  Sidelying: Clamshells, reverse clamshells, very challenging on left side, easy on right side with BTB. 1x12 in each position. Unable to reverse clamshell even without resistance on left  side. Active assisted hip extension on left side, 2x10 with cuing for keeping hips stacked. SPT provided assistance at hips and ankle for limb support. Patient rated as medium-hard.      Patient presents to PT reporting feeling mild fatigue and stress but improved from last session. Patient gave great effort during Mat exercises today and is highly motivated. Active hip extension and abduction on LLE was notably challenging and required moderate tactile and verbal cuing and assistance for moving through full range by SPT. Patient will benefit from further therapeutic exercise progression to address glute med and glute max imbalance of LLE weaker than RLE as well as progressing core stability. Patient will continue to benefit from skilled PT to address gait dysfunction, weakness and balance deficits to improve functional mobility and independence.  Pt educated throughout session about proper posture and technique with exercises. Improved exercise technique, movement at target joints, use of target muscles after min to mod verbal, visual, tactile cues.       PT Short Term Goals - 01/31/21 1419       PT SHORT TERM GOAL #1   Title Patient will be independent in home exercise program to improve strength/mobility for better functional independence with ADLs.    Baseline 8/9: HEP to be given next session    Time 4    Period Weeks    Status New    Target Date 02/28/21               PT Long Term Goals - 03/21/21 1212       PT LONG TERM GOAL #1   Title Patient will increase FOTO score to equal to or greater than 70%    to demonstrate statistically significant improvement in mobility and quality of life.    Baseline 8/9: 64%; risk adjusted 34%; 03/21/21 FOTO: 45    Time 12    Period Weeks    Status On-going    Target Date 04/25/21      PT LONG TERM GOAL #2   Title Patient will increase Berg Balance score by > 6 points to demonstrate decreased fall risk during functional activities.     Baseline 8/9: BBT 29/56; 03/21/21: 42/56    Time 12    Period Weeks    Status Achieved    Target Date 04/25/21      PT LONG TERM GOAL #3   Title Patient will increase 10 meter walk test to >1.49m/s as to improve gait speed for better community ambulation and to reduce fall risk.    Baseline 8/9: 0.65 m/s with rollator;    Time 12    Period Weeks    Status On-going    Target Date 04/25/21      PT LONG TERM GOAL #4   Title Patient will increase six minute  walk test distance to >1000 for progression to community ambulator and improve gait ability    Baseline 8/9: 505 ft with rollator; 03/21/21: 721ft c rollator    Time 12    Period Weeks    Status On-going    Target Date 04/25/21                   Plan - 04/12/21 1537     Clinical Impression Statement Patient presents to PT reporting feeling mild fatigue and stress but improved from last session. Patient gave great effort during Mat exercises today and is highly motivated. Active hip extension and abduction on LLE was notably challenging and required moderate tactile and verbal cuing and assistance for moving through full range by SPT. Patient will benefit from further therapeutic exercise progression to address glute med and glute max imbalance of LLE weaker than RLE as well as progressing core stability. Patient will continue to benefit from skilled PT to address gait dysfunction, weakness and balance deficits to improve functional mobility and independence.    Personal Factors and Comorbidities Age;Comorbidity 3+;Fitness;Past/Current Experience;Time since onset of injury/illness/exacerbation    Comorbidities aroxysmal a fib, MS, lymphedema, hypothyroidism, Graves Disease, arthritis, lumbar herniated disc (L5), depression and neuropathy    Examination-Activity Limitations Bed Mobility;Bend;Caring for Others;Carry;Reach Overhead;Locomotion Level;Lift;Hygiene/Grooming;Dressing;Squat;Stairs;Stand;Toileting;Transfers     Examination-Participation Restrictions Cleaning;Community Activity;Laundry;Occupation;Shop;Volunteer;Yard Work    Merchant [employer] officer Evolving/Moderate complexity    Rehab Potential Fair    PT Frequency 2x / week    PT Duration 12 weeks    PT Treatment/Interventions ADLs/Self Care Home Management;Aquatic Therapy;Canalith Repostioning;Biofeedback;Cryotherapy;Ultrasound;Traction;Moist Heat;Electrical Stimulation;DME Instruction;Gait training;Therapeutic exercise;Therapeutic activities;Functional mobility training;Stair training;Balance training;Neuromuscular re-education;Patient/family education;Manual techniques;Orthotic Fit/Training;Compression bandaging;Passive range of motion;Vestibular;Taping;Splinting;Energy conservation;Dry needling;Visual/perceptual remediation/compensation    PT Next Visit Plan Continue SL stance training and LE coordination, expand gait training, address glute strengthening/activation at Mat table; hip extension, bridging on bosu ball/uneven surface, marching bridges, active hip extension and clamshells LLE, quadraped and prone hip strengthening.    PT Home Exercise Plan No updates    Consulted and Agree with Plan of Care Patient             Patient will benefit from skilled therapeutic intervention in order to improve the following deficits and impairments:  Abnormal gait, Cardiopulmonary status limiting activity, Decreased activity tolerance, Decreased coordination, Decreased balance, Decreased endurance, Decreased mobility, Decreased strength, Decreased range of motion, Difficulty walking, Impaired flexibility, Impaired sensation, Postural dysfunction, Improper body mechanics  Visit Diagnosis: Muscle weakness (generalized)  Unsteadiness on feet  Other abnormalities of gait and mobility     Problem List Patient Active Problem List   Diagnosis Date Noted   Persistent atrial fibrillation (HCC)    Graves disease    Abdominal bloating    Atrial  fibrillation with RVR ([place]) 10/01/2019   Hyperthyroidism    Atrial fibrillation with rapid ventricular response ([place]) 09/30/2019   Leg edema    Left leg cellulitis    Special screening for malignant neoplasms, colon    Polyp of sigmoid colon    Benign neoplasm of ascending colon    Rectal polyp    Herniated nucleus pulposus, L5-S1 11/09/2015   Low back pain 10/25/2015   Herniated nucleus pulposus, C5-6 right 10/06/2015   Dysesthesia 09/16/2015   Spasticity 09/16/2015   Unsteady gait 09/16/2015   Multiple sclerosis ([place]) 06/08/2011   Arsenio Katz, SPT   This entire session was performed under direct supervision and direction of a licensed therapist/therapist assistant . I have personally read, edited  and approve of the note as written.  Janna Arch, PT, DPT  04/12/2021, 3:54 PM  [place] MAIN [facility] SERVICES [address] [place], [place], [address] Phone: [phone]   Fax:  [phone]  Name: Dustin Gray MRN: [id number] Date of Birth: 05-16-1956

## 2021-04-17 ENCOUNTER — Other Ambulatory Visit: Payer: Self-pay

## 2021-04-17 ENCOUNTER — Ambulatory Visit: Payer: Medicare PPO

## 2021-04-17 DIAGNOSIS — R2681 Unsteadiness on feet: Secondary | ICD-10-CM | POA: Diagnosis not present

## 2021-04-17 DIAGNOSIS — R2689 Other abnormalities of gait and mobility: Secondary | ICD-10-CM

## 2021-04-17 DIAGNOSIS — M6281 Muscle weakness (generalized): Secondary | ICD-10-CM

## 2021-04-17 DIAGNOSIS — R278 Other lack of coordination: Secondary | ICD-10-CM | POA: Diagnosis not present

## 2021-04-17 NOTE — Therapy (Signed)
[place] MAIN [facility] SERVICES [address] [place], [place], [address] Phone: [phone]   Fax:  [phone]  Physical Therapy Treatment  Patient Details  Name: Dustin Gray MRN: [id number] Date of Birth: 05/19/1956 No data recorded  Encounter Date: 04/17/2021   PT End of Session - 04/17/21 1429     Visit Number 17    Number of Visits 24    Date for PT Re-Evaluation 04/25/21    Authorization Type [employer]    Authorization Time Period 01/31/21-04/25/21    PT Start Time 1430    PT Stop Time 1515    PT Time Calculation (min) 45 min    Equipment Utilized During Treatment Gait belt    Activity Tolerance Patient tolerated treatment well;No increased pain;Patient limited by fatigue    Behavior During Therapy [facility] for tasks assessed/performed             Past Medical History:  Diagnosis Date   Abscess    groin   Arthritis    lower spine   Erectile dysfunction    Low testosterone    Lumbar herniated disc    L5   Multiple sclerosis (HCC)    Staph aureus infection     Past Surgical History:  Procedure Laterality Date   COLONOSCOPY WITH PROPOFOL N/A 07/04/2016   Procedure: COLONOSCOPY WITH PROPOFOL;  Surgeon: [name], MD;  Location: [place];  Service: Endoscopy;  Laterality: N/A;   [place]   POLYPECTOMY  07/04/2016   Procedure: POLYPECTOMY;  Surgeon: [name], MD;  Location: [place];  Service: Endoscopy;;   TONSILLECTOMY      There were no vitals filed for this visit.     Subjective Assessment - 04/17/21 1429     Subjective Patient reports feeling well today, not as fatigued as last week. No falls or LOB since last appointment. Patient received lymphedema referral from provider and is looking forward to getting appointments scheduled for that.    Pertinent History Patient presents to physical therapy for unsteadiness, walking instability, fatigue. His  PMH includes paroxysmal a fib, MS, lymphedema, hypothyroidism, Graves Disease, arthritis, lumbar herniated disc (L5), depression and neuropathy. Patient first developed symptoms of MS in 2005 and was diagnosed in 2006. Patient has done PT in the past, but hasn't been seen since 2020 at [facility]. Has not been doing his exercises in the past year. Walk outside to garage to ride lawnmower, walks in house. 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 a couple hours    How long can you stand comfortably? no more than 10 minutes before pain, have to steady self immediately    How long can you walk comfortably? has to use rollator. can walk in a store with a cart    Patient Stated Goals want to get stronger and feel mor comfortable walking around.    Currently in Pain? No/denies    Pain Score 0-No pain    Pain Onset More than a month ago            Neuro Re-Ed: At parallel bars: Hip hikes on airex 2x 12 each LE Modified lunge for hip flexor stretch 2x30 sec each side Tandem stance on airex and green airex with multidirectional reaching 2x10 pushed forward, 10 pushes overhead x  each side.  Calf raises with light UE support at parallel bars 2x20.LLE knee hyperextension noted as fatigue increased. Side stepping over hurdles 8x lengths of bars STS with weighted ball 1x10, 1x15 no weight, slow concentric and eccentric control Step ups on 4in step with SUE support, holding weight (2lb) in other hand to engage core while maintaining upright. 1x10 each side.     Patient demonstrates improved body awareness during Therapeutic Exercises today. He tolerated progression of skilled PT well today with great effort even as fatigue increased. LLE weakness appeared mildly more pronounced today, with more episodes of knee hyperextension during functional movements, however he  noticed limitations and self corrected frequently. Patient will continue to benefit from skilled PT to improve functional strength, mobility, balance, and gait deviations to decrease falls risk and improve quality of life.    Pt educated throughout session about proper posture and technique with exercises. Improved exercise technique, movement at target joints, use of target muscles after min to mod verbal, visual, tactile cues.       PT Education - 04/17/21 1524     Education Details Exercise Technique    Person(s) Educated Patient    Methods Explanation;Demonstration;Tactile cues;Verbal cues    Comprehension Verbalized understanding;Returned demonstration;Verbal cues required;Tactile cues required              PT Short Term Goals - 01/31/21 1419       PT SHORT TERM GOAL #1   Title Patient will be independent in home exercise program to improve strength/mobility for better functional independence with ADLs.    Baseline 8/9: HEP to be given next session    Time 4    Period Weeks    Status New    Target Date 02/28/21               PT Long Term Goals - 03/21/21 1212       PT LONG TERM GOAL #1   Title Patient will increase FOTO score to equal to or greater than 70%    to demonstrate statistically significant improvement in mobility and quality of life.    Baseline 8/9: 64%; risk adjusted 34%; 03/21/21 FOTO: 45    Time 12    Period Weeks    Status On-going    Target Date 04/25/21      PT LONG TERM GOAL #2   Title Patient will increase Berg Balance score by > 6 points to demonstrate decreased fall risk during functional activities.    Baseline 8/9: BBT 29/56; 03/21/21: 42/56    Time 12    Period Weeks    Status Achieved    Target Date 04/25/21      PT LONG TERM GOAL #3   Title Patient will increase 10 meter walk test to >1.39m/s as to improve gait speed for better community ambulation and to reduce fall risk.    Baseline 8/9: 0.65 m/s with rollator;    Time 12     Period Weeks    Status On-going    Target Date 04/25/21      PT LONG TERM GOAL #4   Title Patient will increase six minute walk test distance to >1000 for progression to community ambulator and improve gait ability    Baseline 8/9: 505 ft with rollator; 03/21/21: 793ft c rollator    Time 12    Period Weeks    Status On-going    Target Date 04/25/21  Plan - 04/17/21 1530     Clinical Impression Statement ?Patient demonstrates improved body awareness during Therapeutic Exercises today. He tolerated progression of skilled PT well today with great effort even as fatigue increased. LLE weakness appeared mildly more pronounced today, with more episodes of knee hyperextension during functional movements, however he noticed limitations and self corrected frequently. Patient will continue to benefit from skilled PT to improve functional strength, mobility, balance, and gait deviations to decrease falls risk and improve quality of life.    Personal Factors and Comorbidities Age;Comorbidity 3+;Fitness;Past/Current Experience;Time since onset of injury/illness/exacerbation    Comorbidities aroxysmal a fib, MS, lymphedema, hypothyroidism, Graves Disease, arthritis, lumbar herniated disc (L5), depression and neuropathy    Examination-Activity Limitations Bed Mobility;Bend;Caring for Others;Carry;Reach Overhead;Locomotion Level;Lift;Hygiene/Grooming;Dressing;Squat;Stairs;Stand;Toileting;Transfers    Examination-Participation Restrictions Cleaning;Community Activity;Laundry;Occupation;Shop;Volunteer;Yard Work    [employer]chant [employer] officer Evolving/Moderate complexity    Rehab Potential Fair    PT Frequency 2x / week    PT Duration 12 weeks    PT Treatment/Interventions ADLs/Self Care Home Management;Aquatic Therapy;Canalith Repostioning;Biofeedback;Cryotherapy;Ultrasound;Traction;Moist Heat;Electrical Stimulation;DME Instruction;Gait training;Therapeutic  exercise;Therapeutic activities;Functional mobility training;Stair training;Balance training;Neuromuscular re-education;Patient/family education;Manual techniques;Orthotic Fit/Training;Compression bandaging;Passive range of motion;Vestibular;Taping;Splinting;Energy conservation;Dry needling;Visual/perceptual remediation/compensation    PT Next Visit Plan Continue SL stance training and LE coordination, expand gait training, address glute strengthening/activation at Mat table; hip extension, bridging on bosu ball/uneven surface, marching bridges, active hip extension and clamshells LLE, quadraped and prone hip strengthening.    PT Home Exercise Plan No updates    Consulted and Agree with Plan of Care Patient             Patient will benefit from skilled therapeutic intervention in order to improve the following deficits and impairments:  Abnormal gait, Cardiopulmonary status limiting activity, Decreased activity tolerance, Decreased coordination, Decreased balance, Decreased endurance, Decreased mobility, Decreased strength, Decreased range of motion, Difficulty walking, Impaired flexibility, Impaired sensation, Postural dysfunction, Improper body mechanics  Visit Diagnosis: Muscle weakness (generalized)  Other abnormalities of gait and mobility  Unsteadiness on feet     Problem List Patient Active Problem List   Diagnosis Date Noted   Persistent atrial fibrillation (HCC)    Graves disease    Abdominal bloating    Atrial fibrillation with RVR ([place]) 10/01/2019   Hyperthyroidism    Atrial fibrillation with rapid ventricular response ([place]) 09/30/2019   Leg edema    Left leg cellulitis    Special screening for malignant neoplasms, colon    Polyp of sigmoid colon    Benign neoplasm of ascending colon    Rectal polyp    Herniated nucleus pulposus, L5-S1 11/09/2015   Low back pain 10/25/2015   Herniated nucleus pulposus, C5-6 right 10/06/2015   Dysesthesia 09/16/2015   Spasticity  09/16/2015   Unsteady gait 09/16/2015   Multiple sclerosis ([place]) 06/08/2011   Arsenio Katz, SPT  This entire session was performed under direct supervision and direction of a licensed therapist/therapist assistant . I have personally read, edited and approve of the note as written.  Janna Arch, PT, DPT  04/17/2021, 3:40 PM  [place] MAIN [facility] SERVICES [address] [place], [place], [address] Phone: [phone]   Fax:  [phone]  Name: KALLUM JORGENSEN MRN: [id number] Date of Birth: 1956-01-17

## 2021-04-19 ENCOUNTER — Other Ambulatory Visit: Payer: Self-pay

## 2021-04-19 ENCOUNTER — Ambulatory Visit: Payer: Medicare PPO

## 2021-04-19 DIAGNOSIS — M6281 Muscle weakness (generalized): Secondary | ICD-10-CM | POA: Diagnosis not present

## 2021-04-19 DIAGNOSIS — R2681 Unsteadiness on feet: Secondary | ICD-10-CM | POA: Diagnosis not present

## 2021-04-19 DIAGNOSIS — R2689 Other abnormalities of gait and mobility: Secondary | ICD-10-CM

## 2021-04-19 DIAGNOSIS — R278 Other lack of coordination: Secondary | ICD-10-CM | POA: Diagnosis not present

## 2021-04-19 NOTE — Therapy (Signed)
[place] MAIN [facility] SERVICES [address] [place], [place], [address] Phone: [phone]   Fax:  [phone]  Physical Therapy Treatment  Patient Details  Name: Dustin Gray MRN: [id number] Date of Birth: 1955-07-13 No data recorded  Encounter Date: 04/19/2021   PT End of Session - 04/19/21 1423     Visit Number 18    Number of Visits 24    Date for PT Re-Evaluation 04/25/21    Authorization Type [employer]    Authorization Time Period 01/31/21-04/25/21    PT Start Time 1430    PT Stop Time 1515    PT Time Calculation (min) 45 min    Equipment Utilized During Treatment Gait belt    Activity Tolerance Patient tolerated treatment well;No increased pain;Patient limited by fatigue    Behavior During Therapy [facility] for tasks assessed/performed             Past Medical History:  Diagnosis Date   Abscess    groin   Arthritis    lower spine   Erectile dysfunction    Low testosterone    Lumbar herniated disc    L5   Multiple sclerosis (HCC)    Staph aureus infection     Past Surgical History:  Procedure Laterality Date   COLONOSCOPY WITH PROPOFOL N/A 07/04/2016   Procedure: COLONOSCOPY WITH PROPOFOL;  Surgeon: [name], MD;  Location: [place];  Service: Endoscopy;  Laterality: N/A;   [place]   POLYPECTOMY  07/04/2016   Procedure: POLYPECTOMY;  Surgeon: [name], MD;  Location: [place];  Service: Endoscopy;;   TONSILLECTOMY      There were no vitals filed for this visit.   Subjective Assessment - 04/19/21 1431     Subjective Patient reports feeling very fatigued today due to being at ED for wife all last night into this morning. Denies any pain or falls since last appointment. Pt also reported a recent increase in episodes of hypersensitivity/allodynia on LLE at night when turning over in bed; "the bedsheets brushing on my leg causes shooting pain".     Pertinent History Patient presents to physical therapy for unsteadiness, walking instability, fatigue. His PMH includes paroxysmal a fib, MS, lymphedema, hypothyroidism, Graves Disease, arthritis, lumbar herniated disc (L5), depression and neuropathy. Patient first developed symptoms of MS in 2005 and was diagnosed in 2006. Patient has done PT in the past, but hasn't been seen since 2020 at [facility]. Has not been doing his exercises in the past year. Walk outside to garage to ride lawnmower, walks in house. 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 a couple hours    How long can you stand comfortably? no more than 10 minutes before pain, have to steady self immediately    How long can you walk comfortably? has to use rollator. can walk in a store with a cart    Patient Stated Goals want to get stronger and feel mor comfortable walking around.    Currently in Pain? No/denies    Pain Score 0-No pain    Pain Onset More than a month ago           Ther Ex on Mat table:  Supine: Modified dead bug with SB isometric push  1x10 Modified dead bug with SB isometric  push alternating arm extension 1x12   Prone: Prone quad passive stretch  x 60sec each LE. Hamstring curls, 1x10 active assisted on LLE, 1x10 with 2.5# weight on RLE. Hip extension/Glute kickbacks with knee bent and ankle supported on SPT shoulder. 2x10 each side.   Quadraped: Modified Fire Hydrants (hip abduction) over SB (silver)for support. 2x10 bilaterally. Bird Dogs with Swiss ball (silver)support under chest, 2x8  Seated: -      LAQs 1x8 with SPT assist at heel on LLE. Cuing for maintaining upright posture.   Pt educated throughout session about proper posture and technique with exercises. Improved exercise technique, movement at target joints, use of target muscles after min to mod  verbal, visual, tactile cues.  Patient tolerated addition of Mat exercises for glute, hamstring, and core strengthening extremely well despite reporting fatigue at beginning of session. Patient demonstrated significant difficulty moving LLE independently against gravity into hip extension and abduction without SPT assistance, however ideal muscle activation was noted. Patient demonstrated difficulty with TherEx requiring core and back stabilization and will benefit from continued Mat based strengthening exercises. Patient will continue to benefit from skilled PT to address weakness, gait impairments, and balance to improve safety with ADLs and quality of life.                PT Short Term Goals - 01/31/21 1419       PT SHORT TERM GOAL #1   Title Patient will be independent in home exercise program to improve strength/mobility for better functional independence with ADLs.    Baseline 8/9: HEP to be given next session    Time 4    Period Weeks    Status New    Target Date 02/28/21               PT Long Term Goals - 03/21/21 1212       PT LONG TERM GOAL #1   Title Patient will increase FOTO score to equal to or greater than 70%    to demonstrate statistically significant improvement in mobility and quality of life.    Baseline 8/9: 64%; risk adjusted 34%; 03/21/21 FOTO: 45    Time 12    Period Weeks    Status On-going    Target Date 04/25/21      PT LONG TERM GOAL #2   Title Patient will increase Berg Balance score by > 6 points to demonstrate decreased fall risk during functional activities.    Baseline 8/9: BBT 29/56; 03/21/21: 42/56    Time 12    Period Weeks    Status Achieved    Target Date 04/25/21      PT LONG TERM GOAL #3   Title Patient will increase 10 meter walk test to >1.59m/s as to improve gait speed for better community ambulation and to reduce fall risk.    Baseline 8/9: 0.65 m/s with rollator;    Time 12    Period Weeks    Status On-going     Target Date 04/25/21      PT LONG TERM GOAL #4   Title Patient will increase six minute walk test distance to >1000 for progression to community ambulator and improve gait ability    Baseline 8/9: 505 ft with rollator; 03/21/21: 72ft c rollator    Time 12    Period Weeks    Status On-going    Target Date 04/25/21  Plan - 04/19/21 1424     Clinical Impression Statement Patient tolerated addition of mat exercises for glute, hamstring, and core strengthening extremely well despite reporting fatigue at beginning of session. Patient demonstrated significant difficulty moving LLE independently against gravity into hip extension and abduction without SPT assistance, however ideal muscle activation was noted. Patient demonstrated difficulty with TherEx requiring core and back stabilization and will benefit from continued Mat based strengthening exercises. Patient will continue to benefit from skilled PT to address weakness, gait impairments, and balance to improve safety with ADLs and quality of life.    Personal Factors and Comorbidities Age;Comorbidity 3+;Fitness;Past/Current Experience;Time since onset of injury/illness/exacerbation    Comorbidities aroxysmal a fib, MS, lymphedema, hypothyroidism, Graves Disease, arthritis, lumbar herniated disc (L5), depression and neuropathy    Examination-Activity Limitations Bed Mobility;Bend;Caring for Others;Carry;Reach Overhead;Locomotion Level;Lift;Hygiene/Grooming;Dressing;Squat;Stairs;Stand;Toileting;Transfers    Examination-Participation Restrictions Cleaning;Community Activity;Laundry;Occupation;Shop;Volunteer;Yard Work    Merchant [employer] officer Evolving/Moderate complexity    Rehab Potential Fair    PT Frequency 2x / week    PT Duration 12 weeks    PT Treatment/Interventions ADLs/Self Care Home Management;Aquatic Therapy;Canalith Repostioning;Biofeedback;Cryotherapy;Ultrasound;Traction;Moist Heat;Electrical  Stimulation;DME Instruction;Gait training;Therapeutic exercise;Therapeutic activities;Functional mobility training;Stair training;Balance training;Neuromuscular re-education;Patient/family education;Manual techniques;Orthotic Fit/Training;Compression bandaging;Passive range of motion;Vestibular;Taping;Splinting;Energy conservation;Dry needling;Visual/perceptual remediation/compensation    PT Next Visit Plan Update HEP. Continue SL stance training and LE coordination, expand gait training, address glute strengthening/activation at Mat table; hip extension, bridging on bosu ball/uneven surface, marching bridges, active hip extension and clamshells LLE, quadraped and prone hip strengthening.    PT Home Exercise Plan No updates    Consulted and Agree with Plan of Care Patient             Patient will benefit from skilled therapeutic intervention in order to improve the following deficits and impairments:  Abnormal gait, Cardiopulmonary status limiting activity, Decreased activity tolerance, Decreased coordination, Decreased balance, Decreased endurance, Decreased mobility, Decreased strength, Decreased range of motion, Difficulty walking, Impaired flexibility, Impaired sensation, Postural dysfunction, Improper body mechanics  Visit Diagnosis: Muscle weakness (generalized)  Other abnormalities of gait and mobility     Problem List Patient Active Problem List   Diagnosis Date Noted   Persistent atrial fibrillation (HCC)    Graves disease    Abdominal bloating    Atrial fibrillation with RVR ([place]) 10/01/2019   Hyperthyroidism    Atrial fibrillation with rapid ventricular response ([place]) 09/30/2019   Leg edema    Left leg cellulitis    Special screening for malignant neoplasms, colon    Polyp of sigmoid colon    Benign neoplasm of ascending colon    Rectal polyp    Herniated nucleus pulposus, L5-S1 11/09/2015   Low back pain 10/25/2015   Herniated nucleus pulposus, C5-6 right 10/06/2015    Dysesthesia 09/16/2015   Spasticity 09/16/2015   Unsteady gait 09/16/2015   Multiple sclerosis ([place]) 06/08/2011   Dustin Gray, SPT   This entire session was performed under direct supervision and direction of a licensed therapist/therapist assistant . I have personally read, edited and approve of the note as written.  Janna Arch, PT, DPT  04/19/2021, 3:41 PM  [place] MAIN [facility] SERVICES [address] [place], [place], [address] Phone: [phone]   Fax:  [phone]  Name: Dustin Gray MRN: [id number] Date of Birth: July 14, 1955

## 2021-04-24 ENCOUNTER — Other Ambulatory Visit: Payer: Self-pay

## 2021-04-24 ENCOUNTER — Ambulatory Visit: Payer: Medicare PPO

## 2021-04-24 DIAGNOSIS — R2689 Other abnormalities of gait and mobility: Secondary | ICD-10-CM

## 2021-04-24 DIAGNOSIS — M6281 Muscle weakness (generalized): Secondary | ICD-10-CM

## 2021-04-24 DIAGNOSIS — R278 Other lack of coordination: Secondary | ICD-10-CM | POA: Diagnosis not present

## 2021-04-24 DIAGNOSIS — R2681 Unsteadiness on feet: Secondary | ICD-10-CM | POA: Diagnosis not present

## 2021-04-24 NOTE — Therapy (Signed)
[place] MAIN [facility] SERVICES [address] [place], [place], [address] Phone: [phone]   Fax:  [phone]  Physical Therapy Treatment / Recert  Patient Details  Name: Dustin Gray MRN: [id number] Date of Birth: 12-05-1955 No data recorded  Encounter Date: 04/24/2021   PT End of Session - 04/24/21 1430     Visit Number 19    Number of Visits 43    Date for PT Re-Evaluation 07/17/21    Authorization Type [employer]    Authorization Time Period 01/31/21-04/25/21    PT Start Time 1432    PT Stop Time 1515    PT Time Calculation (min) 43 min    Equipment Utilized During Treatment Gait belt    Activity Tolerance Patient tolerated treatment well;No increased pain;Patient limited by fatigue    Behavior During Therapy [facility] for tasks assessed/performed             Past Medical History:  Diagnosis Date   Abscess    groin   Arthritis    lower spine   Erectile dysfunction    Low testosterone    Lumbar herniated disc    L5   Multiple sclerosis (HCC)    Staph aureus infection     Past Surgical History:  Procedure Laterality Date   COLONOSCOPY WITH PROPOFOL N/A 07/04/2016   Procedure: COLONOSCOPY WITH PROPOFOL;  Surgeon: [name], MD;  Location: [place];  Service: Endoscopy;  Laterality: N/A;   [place]   POLYPECTOMY  07/04/2016   Procedure: POLYPECTOMY;  Surgeon: [name], MD;  Location: [place];  Service: Endoscopy;;   TONSILLECTOMY      There were no vitals filed for this visit.   Subjective Assessment - 04/24/21 1529     Subjective Patient denies LOB or falls since last session and no current complaints of pain.    Pertinent History Patient presents to physical therapy for unsteadiness, walking instability, fatigue. His PMH includes paroxysmal a fib, MS, lymphedema, hypothyroidism, Graves Disease, arthritis, lumbar herniated disc (L5), depression and  neuropathy. Patient first developed symptoms of MS in 2005 and was diagnosed in 2006. Patient has done PT in the past, but hasn't been seen since 2020 at [facility]. Has not been doing his exercises in the past year. Walk outside to garage to ride lawnmower, walks in house. 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 a couple hours    How long can you stand comfortably? no more than 10 minutes before pain, have to steady self immediately    How long can you walk comfortably? has to use rollator. can walk in a store with a cart    Patient Stated Goals want to get stronger and feel mor comfortable walking around. Patient would like to continue strengthening hips and improve endurance and ability to pace self for safety while walking.    Currently in Pain? No/denies    Pain Score 0-No pain    Pain Onset More than a month ago              HEP was reviewed, ST and LT goals re-assessed, new goals added, and education provided on plan of care.  6MWT: 755 ft with bariatric Rollator and close CGA. Patient with significant hip drop and occasional toe drag noted which  increased as fatigue set in. Noticeable pace decrease occurred at around 99mn mark/650 feet. Patient noted at end that he didn't do "a good job pacing myself". Patient c/o of shoulder discomfort as he fatigues.  10MWT with bariatric Rollator: Trial 1: 0.68 m/s Trial 2: 0.68 m/s  MMT: Left glute med 3-/5; unable to hold in test position but could achieve full active range in supine. Right glute med 4/5  Patient demonstrated moderate difficulty rolling on mat table with LLE getting stuck under RLE and needing assistance from SPT or patient's UE to position it.  Single Limb Stance: LLE: 1.83 sec with close CGA and no UE support. Extremely challenging for patient. RLE: 17.8 sec with close CGA and no  UE support. Patient maintained balance, but significant ankle reactions and trunk sway noted throughout time.   Pt educated throughout session about proper posture and technique with exercises. Improved exercise technique, movement at target joints, use of target muscles after min to mod verbal, visual, tactile cues.           PT Education - 04/24/21 1531     Education Details Patient educated on recertification process, new goals were set.    Person(s) Educated Patient    Methods Explanation;Demonstration    Comprehension Returned demonstration;Verbalized understanding              PT Short Term Goals - 04/24/21 1432       PT SHORT TERM GOAL #1   Title Patient will be independent in home exercise program to improve strength/mobility for better functional independence with ADLs.    Baseline 8/9: HEP to be given next session, 10/31: patient reports compliance with HEP and would like progressions added next session.    Time 4    Period Weeks    Status Partially Met    Target Date 05/22/21               PT Long Term Goals - 04/24/21 1435       PT LONG TERM GOAL #1   Title Patient will increase FOTO score to equal to or greater than 70%    to demonstrate statistically significant improvement in mobility and quality of life.    Baseline 8/9: 64%; risk adjusted 34%; 03/21/21 FOTO: 45; 04/24/21 FOTO: 49%    Time 12    Period Weeks    Status Partially Met    Target Date 07/17/21      PT LONG TERM GOAL #2   Title Patient will increase Berg Balance score by > 6 points to demonstrate decreased fall risk during functional activities.    Baseline 8/9: BBT 29/56; 03/21/21: 42/56    Time 12    Period Weeks    Status Achieved      PT LONG TERM GOAL #3   Title Patient will increase 10 meter walk test to >1.031m as to improve gait speed for better community ambulation and to reduce fall risk.    Baseline 8/9: 0.65 m/s with rollator; 10/31: 0.68 m/s with rollator    Time  12    Period Weeks    Status Partially Met    Target Date 07/17/21      PT LONG TERM GOAL #4   Title Patient will increase six minute walk test distance to >1000 for progression to community ambulator and improve gait ability    Baseline 8/9: 505 ft with rollator; 03/21/21: [id number] rollator, 04/24/21: [id number]    Time 12  Period Weeks    Status On-going    Target Date 07/17/21      PT LONG TERM GOAL #5   Title Patient will increase glute medius strength on Left LE from 3-/5 to 4/5 to improve stability, gait mechanics, and functional strength.    Baseline 10/31: 3-/5    Time 12    Period Weeks    Status New    Target Date 07/17/21      Additional Long Term Goals   Additional Long Term Goals Yes      PT LONG TERM GOAL #6   Title Patient will increase Left single leg stance time to 15 seconds or greater to increase safety in shower and independence with ADLs.    Baseline 10/31; 1.83 sec without UE support    Time 12    Period Weeks    Status New    Target Date 07/17/21                   Plan - 04/24/21 1533     Clinical Impression Statement Patient demonstrated small progress in 2/3 functional goals. Patient is making slow but steady progress towards long term PT goals and re-certification for 24 visits was requested. Patient was educated on trial re-cert process and how progress must be seen by next re-cert date or he will be discharged; patient understood and agreed with plan. Progressions to HEP will be made next session. Patient will continue to benefit from skilled PT to improve strength, gait mechanics, balance, and safety with ADLs to increase independence and quality of life.    Personal Factors and Comorbidities Age;Comorbidity 3+;Fitness;Past/Current Experience;Time since onset of injury/illness/exacerbation    Comorbidities aroxysmal a fib, MS, lymphedema, hypothyroidism, Graves Disease, arthritis, lumbar herniated disc (L5), depression and neuropathy     Examination-Activity Limitations Bed Mobility;Bend;Caring for Others;Carry;Reach Overhead;Locomotion Level;Lift;Hygiene/Grooming;Dressing;Squat;Stairs;Stand;Toileting;Transfers    Examination-Participation Restrictions Cleaning;Community Activity;Laundry;Occupation;Shop;Volunteer;Yard Work    [employer]chant [employer] officer Evolving/Moderate complexity    Rehab Potential Fair    PT Frequency 2x / week    PT Duration 12 weeks    PT Treatment/Interventions ADLs/Self Care Home Management;Aquatic Therapy;Canalith Repostioning;Biofeedback;Cryotherapy;Ultrasound;Traction;Moist Heat;Electrical Stimulation;DME Instruction;Gait training;Therapeutic exercise;Therapeutic activities;Functional mobility training;Stair training;Balance training;Neuromuscular re-education;Patient/family education;Manual techniques;Orthotic Fit/Training;Compression bandaging;Passive range of motion;Vestibular;Taping;Splinting;Energy conservation;Dry needling;Visual/perceptual remediation/compensation    PT Next Visit Plan Update HEP. Continue SL stance training and LE coordination, expand gait training, address glute strengthening/activation at Mat table; hip extension, bridging on bosu ball/uneven surface, marching bridges, active hip extension and clamshells LLE, quadraped and prone hip strengthening.    PT Home Exercise Plan Updates will be made next session.    Consulted and Agree with Plan of Care Patient             Patient will benefit from skilled therapeutic intervention in order to improve the following deficits and impairments:  Abnormal gait, Cardiopulmonary status limiting activity, Decreased activity tolerance, Decreased coordination, Decreased balance, Decreased endurance, Decreased mobility, Decreased strength, Decreased range of motion, Difficulty walking, Impaired flexibility, Impaired sensation, Postural dysfunction, Improper body mechanics  Visit Diagnosis: Muscle weakness (generalized)  Other  abnormalities of gait and mobility     Problem List Patient Active Problem List   Diagnosis Date Noted   Persistent atrial fibrillation (HCC)    Graves disease    Abdominal bloating    Atrial fibrillation with RVR ([place]) 10/01/2019   Hyperthyroidism    Atrial fibrillation with rapid ventricular response ([place]) 09/30/2019   Leg edema    Left leg cellulitis  Special screening for malignant neoplasms, colon    Polyp of sigmoid colon    Benign neoplasm of ascending colon    Rectal polyp    Herniated nucleus pulposus, L5-S1 11/09/2015   Low back pain 10/25/2015   Herniated nucleus pulposus, C5-6 right 10/06/2015   Dysesthesia 09/16/2015   Spasticity 09/16/2015   Unsteady gait 09/16/2015   Multiple sclerosis ([place]) 06/08/2011   Arsenio Katz, SPT This entire session was performed under direct supervision and direction of a licensed therapist/therapist assistant . I have personally read, edited and approve of the note as written.  Janna Arch, PT, DPT  04/24/2021, 3:53 PM  [place] MAIN [facility] SERVICES [address] [place], [place], [address] Phone: [phone]   Fax:  [phone]  Name: OLUWAFEMI VILLELLA MRN: [id number] Date of Birth: [birth year]

## 2021-04-26 ENCOUNTER — Other Ambulatory Visit: Payer: Self-pay

## 2021-04-26 ENCOUNTER — Ambulatory Visit: Payer: Medicare PPO | Attending: Neurology

## 2021-04-26 DIAGNOSIS — R2689 Other abnormalities of gait and mobility: Secondary | ICD-10-CM | POA: Diagnosis not present

## 2021-04-26 DIAGNOSIS — M6281 Muscle weakness (generalized): Secondary | ICD-10-CM | POA: Diagnosis not present

## 2021-04-26 DIAGNOSIS — R278 Other lack of coordination: Secondary | ICD-10-CM | POA: Insufficient documentation

## 2021-04-26 DIAGNOSIS — R2681 Unsteadiness on feet: Secondary | ICD-10-CM | POA: Diagnosis not present

## 2021-04-26 NOTE — Therapy (Signed)
[place] MAIN [facility] SERVICES [address] [place], [place], [address] Phone: [phone]   Fax:  [phone]  Physical Therapy Treatment  Physical Therapy Progress Note   Dates of reporting period  03/21/21   to   04/26/21   Patient Details  Name: Dustin Gray MRN: [id number] Date of Birth: 11-27-1955 No data recorded  Encounter Date: 04/26/2021   PT End of Session - 04/26/21 1427     Visit Number 20    Number of Visits 43    Date for PT Re-Evaluation 07/17/21    Authorization Type [employer]    Authorization Time Period 01/31/21-04/25/21    PT Start Time 1430    PT Stop Time 1515    PT Time Calculation (min) 45 min    Equipment Utilized During Treatment Gait belt    Activity Tolerance Patient tolerated treatment well;No increased pain;Patient limited by fatigue    Behavior During Therapy [facility] for tasks assessed/performed             Past Medical History:  Diagnosis Date   Abscess    groin   Arthritis    lower spine   Erectile dysfunction    Low testosterone    Lumbar herniated disc    L5   Multiple sclerosis (HCC)    Staph aureus infection     Past Surgical History:  Procedure Laterality Date   COLONOSCOPY WITH PROPOFOL N/A 07/04/2016   Procedure: COLONOSCOPY WITH PROPOFOL;  Surgeon: [name], MD;  Location: [place];  Service: Endoscopy;  Laterality: N/A;   [place]   POLYPECTOMY  07/04/2016   Procedure: POLYPECTOMY;  Surgeon: [name], MD;  Location: [place];  Service: Endoscopy;;   TONSILLECTOMY      There were no vitals filed for this visit.   Subjective Assessment - 04/26/21 1429     Subjective Patient reports feeling fatigued today and did not sleep well last night. Denies current pain.    Pertinent History Patient presents to physical therapy for unsteadiness, walking instability, fatigue. His PMH includes paroxysmal a fib, MS,  lymphedema, hypothyroidism, Graves Disease, arthritis, lumbar herniated disc (L5), depression and neuropathy. Patient first developed symptoms of MS in 2005 and was diagnosed in 2006. Patient has done PT in the past, but hasn't been seen since 2020 at [facility]. Has not been doing his exercises in the past year. Walk outside to garage to ride lawnmower, walks in house. 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 a couple hours    How long can you stand comfortably? no more than 10 minutes before pain, have to steady self immediately    How long can you walk comfortably? has to use rollator. can walk in a store with a cart    Patient Stated Goals want to get stronger and feel mor comfortable walking around. Patient would like to continue strengthening hips and improve endurance and ability to pace self for safety while walking.    Currently in Pain? No/denies    Pain Score 0-No pain    Pain Onset More than a month ago             TherEx:  Interval gait training in hallway with bariatric RW and close CGA: -Interval Training:  Trial 1:  50 feet  fast pace. Seated rest break Trial 2: 80 feet fast pace. Seated rest break.  Trial 3:  80 feet fast pace. Seated rest break. Trial 4:  80 feet fast pace. Seated rest break.  Cuing for quick steps and changing gait speed when cued. Occasional LLE toe drag noted as patient fatigued. HR increase was limited; 70bpm at rest to 75bpm after trials.   Interval training on Nu step: 78mn easy level 3, RPM=60; HR 68bpm 45-60sec RPM >80; HR 75bpm 60sec RPM =60 Repeat work:rest cycle x3   Seated calf and hamstring stretch x30sec each side   Seated hip abduction 2x12 single leg at a time with RTB  Supine left hip abduction slides 2x10; cuing for sliding leg on table rather than initiating hip flexion.  Pt educated  throughout session about proper posture and technique with exercises. Improved exercise technique, movement at target joints, use of target muscles after min to mod verbal, visual, tactile cues.  Patient's condition has the potential to improve in response to therapy. Maximum improvement is yet to be obtained. The anticipated improvement is attainable and reasonable in a generally predictable time.    Patient with good effort in PT today despite reporting moderate fatigue and poor sleep the night prior. Interval training with RW was introduced this session to improve cardiovascular endurance and ability for patient to self pace appropriately. He tolerated intervals well, however gait speed was limited by LLE toe drag. Nustep intervals were then introduced and tolerated well; allowing for greater effort by patient without increased concerns for safety. Glute strengthening and passive calf stretching was added to patient HEP. Patient will continue to benefit from skilled PT to improve gait mechanics, mobility, safety with ambulation, independence with ADLs, and increase quality of life.                     PT Short Term Goals - 04/24/21 1432       PT SHORT TERM GOAL #1   Title Patient will be independent in home exercise program to improve strength/mobility for better functional independence with ADLs.    Baseline 8/9: HEP to be given next session, 10/31: patient reports compliance with HEP and would like progressions added next session.    Time 4    Period Weeks    Status Partially Met    Target Date 05/22/21               PT Long Term Goals - 04/24/21 1435       PT LONG TERM GOAL #1   Title Patient will increase FOTO score to equal to or greater than 70%    to demonstrate statistically significant improvement in mobility and quality of life.    Baseline 8/9: 64%; risk adjusted 34%; 03/21/21 FOTO: 45; 04/24/21 FOTO: 49%    Time 12    Period Weeks    Status Partially  Met    Target Date 07/17/21      PT LONG TERM GOAL #2   Title Patient will increase Berg Balance score by > 6 points to demonstrate decreased fall risk during functional activities.    Baseline 8/9: BBT 29/56; 03/21/21: 42/56    Time 12    Period Weeks    Status Achieved      PT LONG TERM GOAL #3   Title Patient will increase 10 meter walk test to >1.022m as to improve gait speed for better community ambulation and to reduce fall risk.    Baseline  8/9: 0.65 m/s with rollator; 10/31: 0.68 m/s with rollator    Time 12    Period Weeks    Status Partially Met    Target Date 07/17/21      PT LONG TERM GOAL #4   Title Patient will increase six minute walk test distance to >1000 for progression to community ambulator and improve gait ability    Baseline 8/9: 505 ft with rollator; 03/21/21: [id number] c rollator, 04/24/21: [id number] rollator    Time 12    Period Weeks    Status On-going    Target Date 07/17/21      PT LONG TERM GOAL #5   Title Patient will increase glute medius strength on Left LE from 3-/5 to 4/5 to improve stability, gait mechanics, and functional strength.    Baseline 10/31: 3-/5    Time 12    Period Weeks    Status New    Target Date 07/17/21      Additional Long Term Goals   Additional Long Term Goals Yes      PT LONG TERM GOAL #6   Title Patient will increase Left single leg stance time to 15 seconds or greater to increase safety in shower and independence with ADLs.    Baseline 10/31; 1.83 sec without UE support    Time 12    Period Weeks    Status New    Target Date 07/17/21                   Plan - 04/26/21 1742     Clinical Impression Statement Patient's condition has the potential to improve in response to therapy. Maximum improvement is yet to be obtained. The anticipated improvement is attainable and reasonable in a generally predictable time. Goals were assessed and updated at previous session. Patient with good effort in PT today despite  reporting moderate fatigue and poor sleep the night prior. Interval training with RW was introduced this session to improve cardiovascular endurance and ability for patient to self pace appropriately. He tolerated intervals well, however gait speed was limited by LLE toe drag. Nustep intervals were then introduced and tolerated well; allowing for greater effort by patient without increased concerns for safety. Glute strengthening and passive calf stretching was added to patient HEP. Patient will continue to benefit from skilled PT to improve gait mechanics, mobility, safety with ambulation, independence with ADLs, and increase quality of life.    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    Rehab Potential Fair    PT Frequency 2x / week    PT Duration 12 weeks    PT Treatment/Interventions ADLs/Self Care Home Management;Aquatic Therapy;Canalith Repostioning;Biofeedback;Cryotherapy;Ultrasound;Traction;Moist Heat;Electrical Stimulation;DME Instruction;Gait training;Therapeutic exercise;Therapeutic activities;Functional mobility training;Stair training;Balance training;Neuromuscular re-education;Patient/family education;Manual techniques;Orthotic Fit/Training;Compression bandaging;Passive range of motion;Vestibular;Taping;Splinting;Energy conservation;Dry needling;Visual/perceptual remediation/compensation    PT Next  Visit Plan Update HEP. Continue SL stance training and LE coordination, expand gait training, address glute strengthening/activation at Mat table; hip extension, bridging on bosu ball/uneven surface, marching bridges, active hip extension and clamshells LLE, quadraped and prone hip strengthening.    PT Home Exercise Plan Updates will be made next session.    Consulted and Agree with Plan of Care  Patient             Patient will benefit from skilled therapeutic intervention in order to improve the following deficits and impairments:  Abnormal gait, Cardiopulmonary status limiting activity, Decreased activity tolerance, Decreased coordination, Decreased balance, Decreased endurance, Decreased mobility, Decreased strength, Decreased range of motion, Difficulty walking, Impaired flexibility, Impaired sensation, Postural dysfunction, Improper body mechanics  Visit Diagnosis: Muscle weakness (generalized)  Other abnormalities of gait and mobility     Problem List Patient Active Problem List   Diagnosis Date Noted   Persistent atrial fibrillation (HCC)    Graves disease    Abdominal bloating    Atrial fibrillation with RVR ([place]) 10/01/2019   Hyperthyroidism    Atrial fibrillation with rapid ventricular response ([place]) 09/30/2019   Leg edema    Left leg cellulitis    Special screening for malignant neoplasms, colon    Polyp of sigmoid colon    Benign neoplasm of ascending colon    Rectal polyp    Herniated nucleus pulposus, L5-S1 11/09/2015   Low back pain 10/25/2015   Herniated nucleus pulposus, C5-6 right 10/06/2015   Dysesthesia 09/16/2015   Spasticity 09/16/2015   Unsteady gait 09/16/2015   Multiple sclerosis ([place]) 06/08/2011   Arsenio Katz, SPT  This entire session was performed under direct supervision and direction of a licensed therapist/therapist assistant . I have personally read, edited and approve of the note as written.  Janna Arch, PT, DPT  04/26/2021, 5:48  PM  [place] MAIN [facility] SERVICES [address] [place], [place], [address] Phone: [phone]   Fax:  [phone]  Name: MALACKI MCPHEARSON MRN: [id number] Date of Birth: 05-Aug-1955

## 2021-05-01 ENCOUNTER — Ambulatory Visit: Payer: Medicare PPO

## 2021-05-01 ENCOUNTER — Other Ambulatory Visit: Payer: Self-pay

## 2021-05-01 DIAGNOSIS — R278 Other lack of coordination: Secondary | ICD-10-CM | POA: Diagnosis not present

## 2021-05-01 DIAGNOSIS — M6281 Muscle weakness (generalized): Secondary | ICD-10-CM | POA: Diagnosis not present

## 2021-05-01 DIAGNOSIS — R2689 Other abnormalities of gait and mobility: Secondary | ICD-10-CM | POA: Diagnosis not present

## 2021-05-01 DIAGNOSIS — R2681 Unsteadiness on feet: Secondary | ICD-10-CM | POA: Diagnosis not present

## 2021-05-01 NOTE — Therapy (Signed)
[place] MAIN [facility] SERVICES [address] [place], [place], [address] Phone: [phone]   Fax:  [phone]  Physical Therapy Treatment  Patient Details  Name: Dustin Gray MRN: [id number] Date of Birth: [birth year] No data recorded  Encounter Date: 05/01/2021   PT End of Session - 05/01/21 1430     Visit Number 21    Number of Visits 43    Date for PT Re-Evaluation 07/17/21    Authorization Type [employer]    Authorization Time Period 01/31/21-04/25/21    PT Start Time 1431    PT Stop Time 1515    PT Time Calculation (min) 44 min    Equipment Utilized During Treatment Gait belt    Activity Tolerance Patient tolerated treatment well;No increased pain;Patient limited by fatigue    Behavior During Therapy [facility] for tasks assessed/performed             Past Medical History:  Diagnosis Date   Abscess    groin   Arthritis    lower spine   Erectile dysfunction    Low testosterone    Lumbar herniated disc    L5   Multiple sclerosis (HCC)    Staph aureus infection     Past Surgical History:  Procedure Laterality Date   COLONOSCOPY WITH PROPOFOL N/A 07/04/2016   Procedure: COLONOSCOPY WITH PROPOFOL;  Surgeon: [name], MD;  Location: [place];  Service: Endoscopy;  Laterality: N/A;   [place]   POLYPECTOMY  07/04/2016   Procedure: POLYPECTOMY;  Surgeon: [name], MD;  Location: [place];  Service: Endoscopy;;   TONSILLECTOMY      There were no vitals filed for this visit.   Subjective Assessment - 05/01/21 1430     Subjective Patient reports Low back is hurting a little today, 5/10. Reports he wants to go back to [place] to revisit possible laminectomy surgery for spinal stenosis.    Pertinent History Patient presents to physical therapy for unsteadiness, walking instability, fatigue. His PMH includes paroxysmal a fib, MS, lymphedema, hypothyroidism, Graves Disease,  arthritis, lumbar herniated disc (L5), depression and neuropathy. Patient first developed symptoms of MS in 2005 and was diagnosed in 2006. Patient has done PT in the past, but hasn't been seen since 2020 at [facility]. Has not been doing his exercises in the past year. Walk outside to garage to ride lawnmower, walks in house. 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 a couple hours    How long can you stand comfortably? no more than 10 minutes before pain, have to steady self immediately    How long can you walk comfortably? has to use rollator. can walk in a store with a cart    Patient Stated Goals want to get stronger and feel mor comfortable walking around. Patient would like to continue strengthening hips and improve endurance and ability to pace self for safety while walking.    Currently in Pain? Yes    Pain Score 5     Pain Location Back    Pain Orientation Medial    Pain Descriptors / Indicators Dull;Aching    Pain Type Chronic pain    Pain Onset More than a month ago             TherEx:  NuStep,  seat position 12, intervals for CV endurance; HR 82bpm prior, 88bpm post. BP 114/59. x39mn 45sec RPM=60, level 4 45sec RPM>80, level 5 Repeat x4  In Parallel Bars: Hip hikes from airex foam/2 in step; cuing for stance leg to push through heel into ground to promote glute activation. 2x12 each side. SPT provided knee blocking to LLE to prevent hyperextension. STS weighted blue ball 1x12 Standing low rows/shoulder extension GTB, on airex, 1x10 patient rated as easy. Progressed to modified tandem stance on airex, working on upright, core control, and strengthening posterior sling. 1x10 each leg forward. - lateral podium steps x8; airex, 4in step,firm surface.  Patient rated as most challenging.   Pt educated throughout session about proper  posture and technique with exercises. Improved exercise technique, movement at target joints, use of target muscles after min to mod verbal, visual, tactile cues.   Patient demonstrated improved hip stability and response to cuing for keeping pelvis stable during single limb stance exercises. Patient tolerated balance and glute strengthening exercises extremely well and will continue to benefit from both closed chain and open chain hip strengthening. Interval training on NuStep was also tolerated well and should continue to be progressed as appropriate. Patient will continue to benefit from skilled PT to improve balance, gait deviations, and strength to increase safety with independent ADLs and mobility.                    PT Education - 05/01/21 1430     Education Details exercise technique    Person(s) Educated Patient    Methods Explanation;Demonstration;Tactile cues;Verbal cues    Comprehension Verbal cues required;Returned demonstration;Verbalized understanding;Tactile cues required              PT Short Term Goals - 04/24/21 1432       PT SHORT TERM GOAL #1   Title Patient will be independent in home exercise program to improve strength/mobility for better functional independence with ADLs.    Baseline 8/9: HEP to be given next session, 10/31: patient reports compliance with HEP and would like progressions added next session.    Time 4    Period Weeks    Status Partially Met    Target Date 05/22/21               PT Long Term Goals - 04/24/21 1435       PT LONG TERM GOAL #1   Title Patient will increase FOTO score to equal to or greater than 70%    to demonstrate statistically significant improvement in mobility and quality of life.    Baseline 8/9: 64%; risk adjusted 34%; 03/21/21 FOTO: 45; 04/24/21 FOTO: 49%    Time 12    Period Weeks    Status Partially Met    Target Date 07/17/21      PT LONG TERM GOAL #2   Title Patient will increase Berg  Balance score by > 6 points to demonstrate decreased fall risk during functional activities.    Baseline 8/9: BBT 29/56; 03/21/21: 42/56    Time 12    Period Weeks    Status Achieved      PT LONG TERM GOAL #3   Title Patient will increase 10 meter walk test to >1.019m as to improve gait speed for better community ambulation and to reduce fall risk.    Baseline 8/9: 0.65 m/s with rollator; 10/31: 0.68 m/s with rollator    Time 12    Period Weeks    Status  Partially Met    Target Date 07/17/21      PT LONG TERM GOAL #4   Title Patient will increase six minute walk test distance to >1000 for progression to community ambulator and improve gait ability    Baseline 8/9: 505 ft with rollator; 03/21/21: 766f c rollator, 04/24/21: [id number] rollator    Time 12    Period Weeks    Status On-going    Target Date 07/17/21      PT LONG TERM GOAL #5   Title Patient will increase glute medius strength on Left LE from 3-/5 to 4/5 to improve stability, gait mechanics, and functional strength.    Baseline 10/31: 3-/5    Time 12    Period Weeks    Status New    Target Date 07/17/21      Additional Long Term Goals   Additional Long Term Goals Yes      PT LONG TERM GOAL #6   Title Patient will increase Left single leg stance time to 15 seconds or greater to increase safety in shower and independence with ADLs.    Baseline 10/31; 1.83 sec without UE support    Time 12    Period Weeks    Status New    Target Date 07/17/21                   Plan - 05/01/21 1526     Clinical Impression Statement Patient demonstrated improved hip stability and response to cuing for keeping pelvis stable during single limb stance exercises. Patient tolerated balance and glute strengthening exercises extremely well and will continue to benefit from both closed chain and open chain hip strengthening. Interval training on NuStep was also tolerated well and should continue to be progressed as appropriate. Patient  will continue to benefit from skilled PT to improve balance, gait deviations, and strength to increase safety with independent ADLs and mobility.    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    Rehab Potential Fair    PT Frequency 2x / week    PT Duration 12 weeks    PT Treatment/Interventions ADLs/Self Care Home Management;Aquatic Therapy;Canalith Repostioning;Biofeedback;Cryotherapy;Ultrasound;Traction;Moist Heat;Electrical Stimulation;DME Instruction;Gait training;Therapeutic exercise;Therapeutic activities;Functional mobility training;Stair training;Balance training;Neuromuscular re-education;Patient/family education;Manual techniques;Orthotic Fit/Training;Compression bandaging;Passive range of motion;Vestibular;Taping;Splinting;Energy conservation;Dry needling;Visual/perceptual remediation/compensation    PT Next Visit Plan Update HEP. Continue SL stance training and LE coordination, expand gait training, address glute strengthening/activation at Mat table; hip extension, bridging on bosu ball/uneven surface, marching bridges, active hip extension and clamshells LLE, quadraped and prone hip strengthening.    Consulted and Agree with Plan of Care Patient             Patient will benefit from skilled therapeutic intervention in order to improve the following deficits and impairments:  Abnormal gait,  Cardiopulmonary status limiting activity, Decreased activity tolerance, Decreased coordination, Decreased balance, Decreased endurance, Decreased mobility, Decreased strength, Decreased range of motion, Difficulty walking, Impaired flexibility, Impaired sensation, Postural dysfunction, Improper body mechanics  Visit Diagnosis: Muscle weakness (generalized)  Other abnormalities of gait and mobility     Problem List Patient Active Problem List   Diagnosis Date Noted   Persistent [place] fibrillation ([place]  Graves disease    Abdominal bloating    Atrial fibrillation with RVR ([place]) 10/01/2019   Hyperthyroidism    Atrial fibrillation with rapid ventricular response ([place]) 09/30/2019   Leg edema    Left leg cellulitis    Special screening for malignant neoplasms, colon    Polyp of sigmoid colon    Benign neoplasm of ascending colon    Rectal polyp    Herniated nucleus pulposus, L5-S1 11/09/2015   Low back pain 10/25/2015   Herniated nucleus pulposus, C5-6 right 10/06/2015   Dysesthesia 09/16/2015   Spasticity 09/16/2015   Unsteady gait 09/16/2015   Multiple sclerosis ([place]) 06/08/2011   Arsenio Katz, SPT  This entire session was performed under direct supervision and direction of a licensed therapist/therapist assistant . I have personally read, edited and approve of the note as written.  Janna Arch, PT, DPT  05/01/2021, 3:31 PM  [place] MAIN [facility] SERVICES [address] [place], [place], [address] Phone: [phone]   Fax:  [phone]  Name: WINFERD WEASE MRN: [id number] Date of Birth: 07/06/1955

## 2021-05-03 ENCOUNTER — Ambulatory Visit: Payer: Medicare PPO

## 2021-05-03 ENCOUNTER — Other Ambulatory Visit: Payer: Self-pay

## 2021-05-03 DIAGNOSIS — R278 Other lack of coordination: Secondary | ICD-10-CM | POA: Diagnosis not present

## 2021-05-03 DIAGNOSIS — R2689 Other abnormalities of gait and mobility: Secondary | ICD-10-CM | POA: Diagnosis not present

## 2021-05-03 DIAGNOSIS — M6281 Muscle weakness (generalized): Secondary | ICD-10-CM | POA: Diagnosis not present

## 2021-05-03 DIAGNOSIS — R2681 Unsteadiness on feet: Secondary | ICD-10-CM | POA: Diagnosis not present

## 2021-05-03 NOTE — Therapy (Signed)
[place] MAIN [facility] SERVICES [address] [place], [place], [address] Phone: [phone]   Fax:  [phone]  Physical Therapy Treatment  Patient Details  Name: Dustin Gray MRN: [id number] Date of Birth: 1956/03/17 No data recorded  Encounter Date: 05/03/2021   PT End of Session - 05/03/21 1523     Visit Number 22    Number of Visits 43    Date for PT Re-Evaluation 07/17/21    Authorization Type [employer]    Authorization Time Period 01/31/21-04/25/21    PT Start Time 1430    PT Stop Time 1515    PT Time Calculation (min) 45 min    Equipment Utilized During Treatment Gait belt    Activity Tolerance Patient tolerated treatment well;No increased pain;Patient limited by fatigue    Behavior During Therapy [facility] for tasks assessed/performed             Past Medical History:  Diagnosis Date   Abscess    groin   Arthritis    lower spine   Erectile dysfunction    Low testosterone    Lumbar herniated disc    L5   Multiple sclerosis (HCC)    Staph aureus infection     Past Surgical History:  Procedure Laterality Date   COLONOSCOPY WITH PROPOFOL N/A 07/04/2016   Procedure: COLONOSCOPY WITH PROPOFOL;  Surgeon: [name], MD;  Location: [place];  Service: Endoscopy;  Laterality: N/A;   [place]   POLYPECTOMY  07/04/2016   Procedure: POLYPECTOMY;  Surgeon: [name], MD;  Location: [place];  Service: Endoscopy;;   TONSILLECTOMY      There were no vitals filed for this visit.   Subjective Assessment - 05/03/21 1435     Subjective Denied current pain, no LOB or falls since last session.    Pertinent History Patient presents to physical therapy for unsteadiness, walking instability, fatigue. His PMH includes paroxysmal a fib, MS, lymphedema, hypothyroidism, Graves Disease, arthritis, lumbar herniated disc (L5), depression and neuropathy. Patient first developed  symptoms of MS in 2005 and was diagnosed in 2006. Patient has done PT in the past, but hasn't been seen since 2020 at [facility]. Has not been doing his exercises in the past year. Walk outside to garage to ride lawnmower, walks in house. 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 a couple hours    How long can you stand comfortably? no more than 10 minutes before pain, have to steady self immediately    How long can you walk comfortably? has to use rollator. can walk in a store with a cart    Patient Stated Goals want to get stronger and feel mor comfortable walking around. Patient would like to continue strengthening hips and improve endurance and ability to pace self for safety while walking.    Currently in Pain? No/denies    Pain Score 0-No pain    Pain Onset More than a month ago            TherEx:   On Mat table: Clamshells, active assisted on left LE 3x8 Bridges working on TA contraction and driving through heels 2x10. Bridges with GTB around knees for active abduction 1x10 with 3sec pause. Bridges with SL march; only able to pick up LLE for march.  4x4 marches. Bridges with feet on Airex 1x8; patient rated as easy. Quadruped serratus plus/modified pushup plus 2x15. Pt rated as medium-hard. Quadruped shoulder taps 2x16; cuing for TA contraction and maintaining stability. Pt rated as medium-hard. Modified Child's pose stretch x30sec.  Pt educated throughout session about proper posture and technique with exercises. Improved exercise technique, movement at target joints, use of target muscles after min to mod verbal, visual, tactile cues.    Patient presents to PT with good motivation and effort throughout. Shoulder stability and scapular strengthening were added in quadraped today; very challenging for patient but tolerated well and he will  continue to benefit from increasing postural awareness and strengthening. Education was provided on proper core activation to prevent LBP during TherEx. Patient will continue to benefit from skilled PT to address gait deviations, strength deficits, and improve independence and safety with mobility and ADLs.                          PT Education - 05/03/21 1522     Education Details Exercise technique, HEP progressions, and educated on added more walking to daily routine.    Person(s) Educated Patient    Methods Explanation;Demonstration;Tactile cues;Verbal cues    Comprehension Verbalized understanding;Returned demonstration;Verbal cues required;Tactile cues required              PT Short Term Goals - 04/24/21 1432       PT SHORT TERM GOAL #1   Title Patient will be independent in home exercise program to improve strength/mobility for better functional independence with ADLs.    Baseline 8/9: HEP to be given next session, 10/31: patient reports compliance with HEP and would like progressions added next session.    Time 4    Period Weeks    Status Partially Met    Target Date 05/22/21               PT Long Term Goals - 04/24/21 1435       PT LONG TERM GOAL #1   Title Patient will increase FOTO score to equal to or greater than 70%    to demonstrate statistically significant improvement in mobility and quality of life.    Baseline 8/9: 64%; risk adjusted 34%; 03/21/21 FOTO: 45; 04/24/21 FOTO: 49%    Time 12    Period Weeks    Status Partially Met    Target Date 07/17/21      PT LONG TERM GOAL #2   Title Patient will increase Berg Balance score by > 6 points to demonstrate decreased fall risk during functional activities.    Baseline 8/9: BBT 29/56; 03/21/21: 42/56    Time 12    Period Weeks    Status Achieved      PT LONG TERM GOAL #3   Title Patient will increase 10 meter walk test to >1.71ms as to improve gait speed for better community  ambulation and to reduce fall risk.    Baseline 8/9: 0.65 m/s with rollator; 10/31: 0.68 m/s with rollator    Time 12    Period Weeks    Status Partially Met    Target Date 07/17/21      PT LONG TERM GOAL #4   Title Patient will increase six minute walk test distance to >1000 for progression to community ambulator and improve gait ability    Baseline 8/9: 505 ft with rollator; 03/21/21: [id number] rollator, 04/24/21: [id number] rollator    Time  12    Period Weeks    Status On-going    Target Date 07/17/21      PT LONG TERM GOAL #5   Title Patient will increase glute medius strength on Left LE from 3-/5 to 4/5 to improve stability, gait mechanics, and functional strength.    Baseline 10/31: 3-/5    Time 12    Period Weeks    Status New    Target Date 07/17/21      Additional Long Term Goals   Additional Long Term Goals Yes      PT LONG TERM GOAL #6   Title Patient will increase Left single leg stance time to 15 seconds or greater to increase safety in shower and independence with ADLs.    Baseline 10/31; 1.83 sec without UE support    Time 12    Period Weeks    Status New    Target Date 07/17/21                   Plan - 05/03/21 1523     Clinical Impression Statement Patient presents to PT with good motivation and effort throughout. Shoulder stability and scapular strengthening were added in quadraped today; very challenging for patient but tolerated well and he will continue to benefit from increasing postural awareness and strengthening. Education was provided on proper core activation to prevent LBP during TherEx. Patient will continue to benefit from skilled PT to address gait deviations, strength deficits, and improve independence and safety with mobility and ADLs.    Personal Factors and Comorbidities Age;Comorbidity 3+;Fitness;Past/Current Experience;Time since onset of injury/illness/exacerbation    Comorbidities aroxysmal a fib, MS, lymphedema, hypothyroidism, Graves  Disease, arthritis, lumbar herniated disc (L5), depression and neuropathy    Examination-Activity Limitations Bed Mobility;Bend;Caring for Others;Carry;Reach Overhead;Locomotion Level;Lift;Hygiene/Grooming;Dressing;Squat;Stairs;Stand;Toileting;Transfers    Examination-Participation Restrictions Cleaning;Community Activity;Laundry;Occupation;Shop;Volunteer;Yard Work    Merchant navy officer Evolving/Moderate complexity    Rehab Potential Fair    PT Frequency 2x / week    PT Duration 12 weeks    PT Treatment/Interventions ADLs/Self Care Home Management;Aquatic Therapy;Canalith Repostioning;Biofeedback;Cryotherapy;Ultrasound;Traction;Moist Heat;Electrical Stimulation;DME Instruction;Gait training;Therapeutic exercise;Therapeutic activities;Functional mobility training;Stair training;Balance training;Neuromuscular re-education;Patient/family education;Manual techniques;Orthotic Fit/Training;Compression bandaging;Passive range of motion;Vestibular;Taping;Splinting;Energy conservation;Dry needling;Visual/perceptual remediation/compensation    PT Next Visit Plan Update HEP. Continue SL stance training and LE coordination, expand gait training, address glute strengthening/activation at Mat table; hip extension, bridging on bosu ball/uneven surface, marching bridges, active hip extension and clamshells LLE, quadraped and prone hip strengthening.    PT Home Exercise Plan Access Code: [email or web address] URL: [email or web address]  Added Bridges with BTB and encouraged more walking around the home during commerical breaks when watching TV.    Consulted and Agree with Plan of Care Patient             Patient will benefit from skilled therapeutic intervention in order to improve the following deficits and impairments:  Abnormal gait, Cardiopulmonary status limiting activity, Decreased activity tolerance, Decreased coordination, Decreased balance, Decreased endurance, Decreased mobility,  Decreased strength, Decreased range of motion, Difficulty walking, Impaired flexibility, Impaired sensation, Postural dysfunction, Improper body mechanics  Visit Diagnosis: Muscle weakness (generalized)  Other lack of coordination     Problem List Patient Active Problem List   Diagnosis Date Noted   Persistent atrial fibrillation (HCC)    Graves disease    Abdominal bloating    Atrial fibrillation with RVR ([place]) 10/01/2019   Hyperthyroidism    Atrial fibrillation with rapid ventricular response ([place]) 09/30/2019   Leg edema  Left leg cellulitis    Special screening for malignant neoplasms, colon    Polyp of sigmoid colon    Benign neoplasm of ascending colon    Rectal polyp    Herniated nucleus pulposus, L5-S1 11/09/2015   Low back pain 10/25/2015   Herniated nucleus pulposus, C5-6 right 10/06/2015   Dysesthesia 09/16/2015   Spasticity 09/16/2015   Unsteady gait 09/16/2015   Multiple sclerosis ([place]) 06/08/2011   Arsenio Katz, SPT  This entire session was performed under direct supervision and direction of a licensed therapist/therapist assistant . I have personally read, edited and approve of the note as written.  Janna Arch, PT, DPT  05/03/2021, 3:35 PM  [place] MAIN [facility] SERVICES [address] [place], [place], [address] Phone: [phone]   Fax:  [phone]  Name: Dustin Gray MRN: [id number] Date of Birth: [birth year]

## 2021-05-08 ENCOUNTER — Ambulatory Visit: Payer: Medicare PPO

## 2021-05-08 ENCOUNTER — Telehealth: Payer: Self-pay | Admitting: Cardiology

## 2021-05-08 MED ORDER — APIXABAN 5 MG PO TABS: 5.0000 mg | ORAL_TABLET | Freq: Two times a day (BID) | ORAL | 1 refills | Status: DC

## 2021-05-08 NOTE — Telephone Encounter (Signed)
Prescription refill request for Eliquis received. Indication: Afib  Last office visit:01/27/21 ([place])  Scr: 0.91 (01/23/21)  Age: 65 Weight: 110.7kg  Appropriate dose and refill sent to requested pharmacy.

## 2021-05-08 NOTE — Telephone Encounter (Signed)
*  STAT* If patient is at the pharmacy, call can be transferred to refill team.   1. Which medications need to be refilled? (please list name of each medication and dose if known) *  Eliquis 5 mg po BID   2. Which pharmacy/location (including street and city if local pharmacy) is medication to be sent to?  [facility] [address] [place]   3. Do they need a 30 day or 90 day supply? 90   Patient has been out since Friday

## 2021-05-10 ENCOUNTER — Ambulatory Visit: Payer: Medicare PPO

## 2021-05-10 ENCOUNTER — Other Ambulatory Visit: Payer: Self-pay

## 2021-05-10 DIAGNOSIS — R278 Other lack of coordination: Secondary | ICD-10-CM

## 2021-05-10 DIAGNOSIS — R2681 Unsteadiness on feet: Secondary | ICD-10-CM | POA: Diagnosis not present

## 2021-05-10 DIAGNOSIS — M6281 Muscle weakness (generalized): Secondary | ICD-10-CM | POA: Diagnosis not present

## 2021-05-10 DIAGNOSIS — R2689 Other abnormalities of gait and mobility: Secondary | ICD-10-CM | POA: Diagnosis not present

## 2021-05-10 NOTE — Therapy (Signed)
[place] MAIN [facility] SERVICES [address] [place], [place], [address] Phone: [phone]   Fax:  [phone]  Physical Therapy Treatment  Patient Details  Name: Dustin Gray MRN: [id number] Date of Birth: 05/09/1956 No data recorded  Encounter Date: 05/10/2021   PT End of Session - 05/10/21 1527     Visit Number 23    Number of Visits 43    Date for PT Re-Evaluation 07/17/21    Authorization Type [employer]    Authorization Time Period 01/31/21-04/25/21    PT Start Time 1435    PT Stop Time 1515    PT Time Calculation (min) 40 min    Equipment Utilized During Treatment Gait belt    Activity Tolerance Patient tolerated treatment well;No increased pain;Patient limited by fatigue    Behavior During Therapy [facility] for tasks assessed/performed             Past Medical History:  Diagnosis Date   Abscess    groin   Arthritis    lower spine   Erectile dysfunction    Low testosterone    Lumbar herniated disc    L5   Multiple sclerosis (HCC)    Staph aureus infection     Past Surgical History:  Procedure Laterality Date   COLONOSCOPY WITH PROPOFOL N/A 07/04/2016   Procedure: COLONOSCOPY WITH PROPOFOL;  Surgeon: [name], MD;  Location: [place];  Service: Endoscopy;  Laterality: N/A;   [place]   POLYPECTOMY  07/04/2016   Procedure: POLYPECTOMY;  Surgeon: [name], MD;  Location: [place];  Service: Endoscopy;;   TONSILLECTOMY      There were no vitals filed for this visit.   Subjective Assessment - 05/10/21 1441     Subjective Patient reports feeling better today compared to earlier in the week when he ran out of Eliquis Rx. Denies falls or LOB since last session.    Pertinent History Patient presents to physical therapy for unsteadiness, walking instability, fatigue. His PMH includes paroxysmal a fib, MS, lymphedema, hypothyroidism, Graves Disease, arthritis,  lumbar herniated disc (L5), depression and neuropathy. Patient first developed symptoms of MS in 2005 and was diagnosed in 2006. Patient has done PT in the past, but hasn't been seen since 2020 at [facility]. Has not been doing his exercises in the past year. Walk outside to garage to ride lawnmower, walks in house. 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 a couple hours    How long can you stand comfortably? no more than 10 minutes before pain, have to steady self immediately    How long can you walk comfortably? has to use rollator. can walk in a store with a cart    Patient Stated Goals want to get stronger and feel mor comfortable walking around. Patient would like to continue strengthening hips and improve endurance and ability to pace self for safety while walking.    Currently in Pain? No/denies    Pain Score 0-No pain    Pain Onset More than a month ago              BP seated RUE prior to session: 110/82, 94bpm HR at end of session: 108bpm   In parallel bars:  - Hip Hikes from green airex 2x15 each LE, blocking  provided to Left knee by SPT to prevent hyperextension. - Alternating standing high knees with BUE support, cuing for lessening UE support, 1x30 each LE - High knees into modified lunge working on weight shift and heel toe rocker 1x30 each LE - Modified tandem stance, back leg on airex, front leg on 6" step with forward press 1x60sec without weight then 1x19mn with weighted yellow ball each side.   - STS training with modified split stance to bias loading LLE; SUE support needed initially, but progressed to no UE support. Very challenging for patient. 1x10.  - Seated ankle pumps LLE x30   Pt educated throughout session about proper posture and technique with exercises. Improved exercise technique, movement at target joints, use  of target muscles after min to mod verbal, visual, tactile cues.   Patient presents to PT late due to difficulty with transport down to gym but gave good effort throughout . Single leg balance and closed train hip strengthening was progressed today and was tolerated well by pt. Largest deficits remaining include LLE AROM, strength, balance, and motor control. Pt will continue to benefit from skilled PT to improve these deficits in order to increase functional mobility, balance, safety with ADLs, and decrease falls risk.                    PT Education - 05/10/21 1527     Education Details Exercise technique    Person(s) Educated Patient    Methods Explanation;Demonstration;Tactile cues;Verbal cues    Comprehension Verbalized understanding;Tactile cues required;Returned demonstration;Verbal cues required              PT Short Term Goals - 04/24/21 1432       PT SHORT TERM GOAL #1   Title Patient will be independent in home exercise program to improve strength/mobility for better functional independence with ADLs.    Baseline 8/9: HEP to be given next session, 10/31: patient reports compliance with HEP and would like progressions added next session.    Time 4    Period Weeks    Status Partially Met    Target Date 05/22/21               PT Long Term Goals - 04/24/21 1435       PT LONG TERM GOAL #1   Title Patient will increase FOTO score to equal to or greater than 70%    to demonstrate statistically significant improvement in mobility and quality of life.    Baseline 8/9: 64%; risk adjusted 34%; 03/21/21 FOTO: 45; 04/24/21 FOTO: 49%    Time 12    Period Weeks    Status Partially Met    Target Date 07/17/21      PT LONG TERM GOAL #2   Title Patient will increase Berg Balance score by > 6 points to demonstrate decreased fall risk during functional activities.    Baseline 8/9: BBT 29/56; 03/21/21: 42/56    Time 12    Period Weeks    Status Achieved       PT LONG TERM GOAL #3   Title Patient will increase 10 meter walk test to >1.033m as to improve gait speed for better community ambulation and to reduce fall risk.    Baseline 8/9: 0.65 m/s with rollator; 10/31: 0.68 m/s with rollator    Time 12    Period Weeks    Status Partially Met    Target Date 07/17/21      PT LONG TERM GOAL #  4   Title Patient will increase six minute walk test distance to >1000 for progression to community ambulator and improve gait ability    Baseline 8/9: 505 ft with rollator; 03/21/21: 754ft c rollator, 04/24/21: 763ft c rollator    Time 12    Period Weeks    Status On-going    Target Date 07/17/21      PT LONG TERM GOAL #5   Title Patient will increase glute medius strength on Left LE from 3-/5 to 4/5 to improve stability, gait mechanics, and functional strength.    Baseline 10/31: 3-/5    Time 12    Period Weeks    Status New    Target Date 07/17/21      Additional Long Term Goals   Additional Long Term Goals Yes      PT LONG TERM GOAL #6   Title Patient will increase Left single leg stance time to 15 seconds or greater to increase safety in shower and independence with ADLs.    Baseline 10/31; 1.83 sec without UE support    Time 12    Period Weeks    Status New    Target Date 07/17/21                   Plan - 05/10/21 1528     Clinical Impression Statement Patient presents to PT late due to difficulty with transport down to gym but gave good effort throughout . Single leg balance and closed train hip strengthening was progressed today and was tolerated well by pt. Largest deficits remaining include LLE AROM, strength, balance, and motor control. Pt will continue to benefit from skilled PT to improve these deficits in order to increase functional mobility, balance, safety with ADLs, and decrease falls risk.    Personal Factors and Comorbidities Age;Comorbidity 3+;Fitness;Past/Current Experience;Time since onset of injury/illness/exacerbation     Comorbidities aroxysmal a fib, MS, lymphedema, hypothyroidism, Graves Disease, arthritis, lumbar herniated disc (L5), depression and neuropathy    Examination-Activity Limitations Bed Mobility;Bend;Caring for Others;Carry;Reach Overhead;Locomotion Level;Lift;Hygiene/Grooming;Dressing;Squat;Stairs;Stand;Toileting;Transfers    Examination-Participation Restrictions Cleaning;Community Activity;Laundry;Occupation;Shop;Volunteer;Yard Work    Merchant navy officer Evolving/Moderate complexity    Rehab Potential Fair    PT Frequency 2x / week    PT Duration 12 weeks    PT Treatment/Interventions ADLs/Self Care Home Management;Aquatic Therapy;Canalith Repostioning;Biofeedback;Cryotherapy;Ultrasound;Traction;Moist Heat;Electrical Stimulation;DME Instruction;Gait training;Therapeutic exercise;Therapeutic activities;Functional mobility training;Stair training;Balance training;Neuromuscular re-education;Patient/family education;Manual techniques;Orthotic Fit/Training;Compression bandaging;Passive range of motion;Vestibular;Taping;Splinting;Energy conservation;Dry needling;Visual/perceptual remediation/compensation    PT Next Visit Plan Update HEP. Continue SL stance training and LE coordination, expand gait training, address glute strengthening/activation at Mat table; hip extension, bridging on bosu ball/uneven surface, marching bridges, active hip extension and clamshells LLE, quadraped and prone hip strengthening.    PT Home Exercise Plan Access Code: [email or web address] URL: [email or web address]  Added Bridges with BTB and encouraged more walking around the home during commerical breaks when watching TV.    Consulted and Agree with Plan of Care Patient             Patient will benefit from skilled therapeutic intervention in order to improve the following deficits and impairments:  Abnormal gait, Cardiopulmonary status limiting activity, Decreased activity tolerance, Decreased  coordination, Decreased balance, Decreased endurance, Decreased mobility, Decreased strength, Decreased range of motion, Difficulty walking, Impaired flexibility, Impaired sensation, Postural dysfunction, Improper body mechanics  Visit Diagnosis: Muscle weakness (generalized)  Other lack of coordination  Unsteadiness on feet     Problem List Patient Active Problem List   Diagnosis Date  Noted   Persistent atrial fibrillation (HCC)    Graves disease    Abdominal bloating    Atrial fibrillation with RVR ([place]) 10/01/2019   Hyperthyroidism    Atrial fibrillation with rapid ventricular response ([place]) 09/30/2019   Leg edema    Left leg cellulitis    Special screening for malignant neoplasms, colon    Polyp of sigmoid colon    Benign neoplasm of ascending colon    Rectal polyp    Herniated nucleus pulposus, L5-S1 11/09/2015   Low back pain 10/25/2015   Herniated nucleus pulposus, C5-6 right 10/06/2015   Dysesthesia 09/16/2015   Spasticity 09/16/2015   Unsteady gait 09/16/2015   Multiple sclerosis ([place]) 06/08/2011   Arsenio Katz, SPT   This entire session was performed under direct supervision and direction of a licensed therapist/therapist assistant . I have personally read, edited and approve of the note as written.  Janna Arch, PT, DPT  05/10/2021, 3:31 PM  [place] MAIN [facility] SERVICES [address] [place], [place], [address] Phone: [phone]   Fax:  [phone]  Name: GAVEN EUGENE MRN: [id number] Date of Birth: [birth year]

## 2021-05-11 DIAGNOSIS — E0500 Thyrotoxicosis with diffuse goiter without thyrotoxic crisis or storm: Secondary | ICD-10-CM | POA: Diagnosis not present

## 2021-05-11 DIAGNOSIS — E042 Nontoxic multinodular goiter: Secondary | ICD-10-CM | POA: Diagnosis not present

## 2021-05-15 ENCOUNTER — Ambulatory Visit: Payer: Medicare PPO

## 2021-05-17 ENCOUNTER — Ambulatory Visit: Payer: Medicare PPO

## 2021-05-22 ENCOUNTER — Ambulatory Visit: Payer: Medicare PPO

## 2021-05-22 ENCOUNTER — Other Ambulatory Visit: Payer: Self-pay

## 2021-05-22 DIAGNOSIS — M6281 Muscle weakness (generalized): Secondary | ICD-10-CM | POA: Diagnosis not present

## 2021-05-22 DIAGNOSIS — R278 Other lack of coordination: Secondary | ICD-10-CM | POA: Diagnosis not present

## 2021-05-22 DIAGNOSIS — R2689 Other abnormalities of gait and mobility: Secondary | ICD-10-CM | POA: Diagnosis not present

## 2021-05-22 DIAGNOSIS — R2681 Unsteadiness on feet: Secondary | ICD-10-CM | POA: Diagnosis not present

## 2021-05-22 DIAGNOSIS — E0500 Thyrotoxicosis with diffuse goiter without thyrotoxic crisis or storm: Secondary | ICD-10-CM | POA: Diagnosis not present

## 2021-05-22 DIAGNOSIS — E042 Nontoxic multinodular goiter: Secondary | ICD-10-CM | POA: Diagnosis not present

## 2021-05-22 NOTE — Therapy (Signed)
[place] MAIN [facility] SERVICES [address] [place], [place], [address] Phone: [phone]   Fax:  [phone]  Physical Therapy Treatment  Patient Details  Name: Dustin Gray MRN: [id number] Date of Birth: 1955/10/14 No data recorded  Encounter Date: 05/22/2021   PT End of Session - 05/22/21 1528     Visit Number 24    Number of Visits 43    Date for PT Re-Evaluation 07/17/21    Authorization Type [employer]    Authorization Time Period 01/31/21-04/25/21    PT Start Time 1432    PT Stop Time 1516    PT Time Calculation (min) 44 min    Equipment Utilized During Treatment Gait belt    Activity Tolerance Patient tolerated treatment well;No increased pain;Patient limited by fatigue    Behavior During Therapy [facility] for tasks assessed/performed             Past Medical History:  Diagnosis Date   Abscess    groin   Arthritis    lower spine   Erectile dysfunction    Low testosterone    Lumbar herniated disc    L5   Multiple sclerosis (HCC)    Staph aureus infection     Past Surgical History:  Procedure Laterality Date   COLONOSCOPY WITH PROPOFOL N/A 07/04/2016   Procedure: COLONOSCOPY WITH PROPOFOL;  Surgeon: [name], MD;  Location: [place];  Service: Endoscopy;  Laterality: N/A;   [place]   POLYPECTOMY  07/04/2016   Procedure: POLYPECTOMY;  Surgeon: [name], MD;  Location: [place];  Service: Endoscopy;;   TONSILLECTOMY      There were no vitals filed for this visit.   Subjective Assessment - 05/22/21 1434     Subjective Patient reports fatigue today and has "been in A fib for a week". Has been having appointments with endocrinologist and they are playing "medication game" with dosage changing between 5-75m but as of appt earlier today she wants him to stay on 137mfor the next two months. He also reports recently ceasing his Lasik medication because he  has been driving a lot and it makes it hard for him to get to a restroom in time. Denies current pain. Has Lymphedema evaluation scheduled for later this week.    Pertinent History Patient presents to physical therapy for unsteadiness, walking instability, fatigue. His PMH includes paroxysmal a fib, MS, lymphedema, hypothyroidism, Graves Disease, arthritis, lumbar herniated disc (L5), depression and neuropathy. Patient first developed symptoms of MS in 2005 and was diagnosed in 2006. Patient has done PT in the past, but [facility] in the past year. Walk outside to garage to ride lawnmower, walks in house. 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 a couple hours    How long can you stand comfortably? no more than 10 minutes before pain, have to steady self immediately    How long can you walk comfortably? has to use rollator. can walk in a store with a cart    Patient Stated Goals want to get stronger and feel mor comfortable walking around. Patient would like to continue strengthening hips and improve endurance and ability to pace self for safety while walking.    Currently  in Pain? No/denies    Pain Score 0-No pain    Pain Onset More than a month ago            BP prior to session: Right arm, seated 92/60, pulse 88  TherEx  On Mat table:  Clamshells on left LE 2x12, Assistance from SPT to maintain neutral hips. Bridges 2x15 working on [employer], exhale on exertion, and driving through heels. Bridges with BTB around knees for active abduction 1x15 with 3sec pause. Quadruped serratus plus/modified pushup plus 2x15. Pt rated as medium. Quadraped chin tucks/cervical retraction 1x8 with 5sec holds.  Quadruped shoulder taps 2x16; cuing for TA contraction and maintaining level shoulders.  Pt rated as medium-hard. Modified Child's pose stretch x30sec.  STS training with staggered stance to bias loading LLE x10  Observation:   Left lower leg with increased erthyema, mild shiny appearance. Visualized one small wound on lateral aspect of mid lower leg, with scant<>minimal serous exudate. Patient reported that this has happened before when not consistently taking Furosemide.   Pt educated throughout session about proper posture and technique with exercises. Improved exercise technique, movement at target joints, use of target muscles after min to mod verbal, visual, tactile cues.   Patient presents to PT with increased complaints of fatigue and Afib from the past week but gave good effort throughout session. Of note, patient's left lower leg appeared with increased erythema and a single, small wound with minimal serous exudate. Patient was encouraged to monitor LLE for wounds and increased symptom change. TherEx was tolerated well by patient who demonstrated improved active ROM and strength against gravity in LLE hip abductors and extensors. Largest deficits remaining include balance, strength, and AROM. Will continue to benefit from skilled PT to increase muscle endurance, strength, and motor control in order to improve functional mobility and decrease falls risk.                        PT Education - 05/22/21 1528     Education Details Exercise technique, TA activation and breath pattern for exercise and optimal core recruitment.    Person(s) Educated Patient    Methods Demonstration;Tactile cues;Explanation;Verbal cues    Comprehension Verbal cues required;Returned demonstration;Verbalized understanding;Tactile cues required              PT Short Term Goals - 04/24/21 1432       PT SHORT TERM GOAL #1   Title Patient will be independent in home exercise program to improve strength/mobility for better functional independence with ADLs.    Baseline 8/9:  HEP to be given next session, 10/31: patient reports compliance with HEP and would like progressions added next session.    Time 4    Period Weeks    Status Partially Met    Target Date 05/22/21               PT Long Term Goals - 04/24/21 1435       PT LONG TERM GOAL #1   Title Patient will increase FOTO score to equal to or greater than 70%    to demonstrate statistically significant improvement in mobility and quality of life.    Baseline 8/9: 64%; risk adjusted 34%; 03/21/21 FOTO: 45; 04/24/21 FOTO: 49%    Time 12    Period Weeks    Status Partially Met    Target Date 07/17/21      PT LONG TERM GOAL #2   Title Patient will increase  Berg Balance score by > 6 points to demonstrate decreased fall risk during functional activities.    Baseline 8/9: BBT 29/56; 03/21/21: 42/56    Time 12    Period Weeks    Status Achieved      PT LONG TERM GOAL #3   Title Patient will increase 10 meter walk test to >1.68ms as to improve gait speed for better community ambulation and to reduce fall risk.    Baseline 8/9: 0.65 m/s with rollator; 10/31: 0.68 m/s with rollator    Time 12    Period Weeks    Status Partially Met    Target Date 07/17/21      PT LONG TERM GOAL #4   Title Patient will increase six minute walk test distance to >1000 for progression to community ambulator and improve gait ability    Baseline 8/9: 505 ft with rollator; 03/21/21: [id number] rollator, 04/24/21: [id number] rollator    Time 12    Period Weeks    Status On-going    Target Date 07/17/21      PT LONG TERM GOAL #5   Title Patient will increase glute medius strength on Left LE from 3-/5 to 4/5 to improve stability, gait mechanics, and functional strength.    Baseline 10/31: 3-/5    Time 12    Period Weeks    Status New    Target Date 07/17/21      Additional Long Term Goals   Additional Long Term Goals Yes      PT LONG TERM GOAL #6   Title Patient will increase Left single leg stance time to 15 seconds or  greater to increase safety in shower and independence with ADLs.    Baseline 10/31; 1.83 sec without UE support    Time 12    Period Weeks    Status New    Target Date 07/17/21                   Plan - 05/22/21 1529     Clinical Impression Statement Patient presents to PT with increased complaints of fatigue and Afib from the past week but gave good effort throughout session. Of note, patient's left lower leg appeared with increased erythema and a single, small wound with minimal serous exudate. Patient was encouraged to monitor LLE for wounds and increased symptom change. TherEx was tolerated well by patient who demonstrated improved active ROM and strength against gravity in LLE hip abductors and extensors. Largest deficits remaining include balance, strength, and AROM. Will continue to benefit from skilled PT to increase muscle endurance, strength, and motor control in order to improve functional mobility and decrease falls risk.    Personal Factors and Comorbidities Age;Comorbidity 3+;Fitness;Past/Current Experience;Time since onset of injury/illness/exacerbation    Comorbidities aroxysmal a fib, MS, lymphedema, hypothyroidism, Graves Disease, arthritis, lumbar herniated disc (L5), depression and neuropathy    Examination-Activity Limitations Bed Mobility;Bend;Caring for Others;Carry;Reach Overhead;Locomotion Level;Lift;Hygiene/Grooming;Dressing;Squat;Stairs;Stand;Toileting;Transfers    Examination-Participation Restrictions Cleaning;Community Activity;Laundry;Occupation;Shop;Volunteer;Yard Work    StaMerchant navy officerolving/Moderate complexity    Rehab Potential Fair    PT Frequency 2x / week    PT Duration 12 weeks    PT Treatment/Interventions ADLs/Self Care Home Management;Aquatic Therapy;Canalith Repostioning;Biofeedback;Cryotherapy;Ultrasound;Traction;Moist Heat;Electrical Stimulation;DME Instruction;Gait training;Therapeutic exercise;Therapeutic  activities;Functional mobility training;Stair training;Balance training;Neuromuscular re-education;Patient/family education;Manual techniques;Orthotic Fit/Training;Compression bandaging;Passive range of motion;Vestibular;Taping;Splinting;Energy conservation;Dry needling;Visual/perceptual remediation/compensation    PT Next Visit Plan Update HEP. Continue SL stance training and LE coordination, expand gait training, address glute strengthening/[facility];  hip extension, bridging on bosu ball/uneven surface, marching bridges, active hip extension and clamshells LLE, quadraped and prone hip strengthening.    PT Home Exercise Plan Access Code: [email or web address] URL: [email or web address]  Added Bridges with BTB and encouraged more walking around the home during commerical breaks when watching TV.    Consulted and Agree with Plan of Care Patient             Patient will benefit from skilled therapeutic intervention in order to improve the following deficits and impairments:  Abnormal gait, Cardiopulmonary status limiting activity, Decreased activity tolerance, Decreased coordination, Decreased balance, Decreased endurance, Decreased mobility, Decreased strength, Decreased range of motion, Difficulty walking, Impaired flexibility, Impaired sensation, Postural dysfunction, Improper body mechanics  Visit Diagnosis: Muscle weakness (generalized)  Unsteadiness on feet     Problem List Patient Active Problem List   Diagnosis Date Noted   Persistent atrial fibrillation (HCC)    Graves disease    Abdominal bloating    Atrial fibrillation with RVR ([place]) 10/01/2019   Hyperthyroidism    Atrial fibrillation with rapid ventricular response ([place]) 09/30/2019   Leg edema    Left leg cellulitis    Special screening for malignant neoplasms, colon    Polyp of sigmoid colon    Benign neoplasm of ascending colon    Rectal polyp    Herniated nucleus pulposus, L5-S1 11/09/2015   Low back  pain 10/25/2015   Herniated nucleus pulposus, C5-6 right 10/06/2015   Dysesthesia 09/16/2015   Spasticity 09/16/2015   Unsteady gait 09/16/2015   Multiple sclerosis ([place]) 06/08/2011   Arsenio Katz, SPT   This entire session was performed under direct supervision and direction of a licensed therapist/therapist assistant . I have personally read, edited and approve of the note as written.  Janna Arch, PT, DPT  05/22/2021, 3:52 PM  [place] MAIN [facility] SERVICES [address] [place], [place], [address] Phone: [phone]   Fax:  [phone]  Name: Dustin Gray MRN: [id number] Date of Birth: 1956-03-26

## 2021-05-24 ENCOUNTER — Ambulatory Visit: Payer: Medicare PPO

## 2021-05-24 ENCOUNTER — Other Ambulatory Visit: Payer: Self-pay

## 2021-05-24 DIAGNOSIS — R2689 Other abnormalities of gait and mobility: Secondary | ICD-10-CM | POA: Diagnosis not present

## 2021-05-24 DIAGNOSIS — R278 Other lack of coordination: Secondary | ICD-10-CM | POA: Diagnosis not present

## 2021-05-24 DIAGNOSIS — M6281 Muscle weakness (generalized): Secondary | ICD-10-CM | POA: Diagnosis not present

## 2021-05-24 DIAGNOSIS — R2681 Unsteadiness on feet: Secondary | ICD-10-CM | POA: Diagnosis not present

## 2021-05-24 NOTE — Therapy (Signed)
[place] MAIN [facility] SERVICES [address] [place], [place], [address] Phone: [phone]   Fax:  [phone]  Physical Therapy Treatment  Patient Details  Name: Dustin Gray MRN: [id number] Date of Birth: [birth year] No data recorded  Encounter Date: 05/24/2021   PT End of Session - 05/24/21 1718     Visit Number 25    Number of Visits 43    Date for PT Re-Evaluation 07/17/21    Authorization Type [employer]    Authorization Time Period 01/31/21-04/25/21    PT Start Time 1434    PT Stop Time 1515    PT Time Calculation (min) 41 min    Equipment Utilized During Treatment Gait belt    Activity Tolerance Patient tolerated treatment well;No increased pain;Patient limited by fatigue    Behavior During Therapy [facility] for tasks assessed/performed             Past Medical History:  Diagnosis Date   Abscess    groin   Arthritis    lower spine   Erectile dysfunction    Low testosterone    Lumbar herniated disc    L5   Multiple sclerosis (HCC)    Staph aureus infection     Past Surgical History:  Procedure Laterality Date   COLONOSCOPY WITH PROPOFOL N/A 07/04/2016   Procedure: COLONOSCOPY WITH PROPOFOL;  Surgeon: [name], MD;  Location: [place];  Service: Endoscopy;  Laterality: N/A;   [place]   POLYPECTOMY  07/04/2016   Procedure: POLYPECTOMY;  Surgeon: [name], MD;  Location: [place];  Service: Endoscopy;;   TONSILLECTOMY      There were no vitals filed for this visit.   Subjective Assessment - 05/24/21 1437     Subjective Patient reports improvement in Afib symptoms since last visit; "only a little flutter today". Denies any pain or falls since last visit.    Pertinent History Patient presents to physical therapy for unsteadiness, walking instability, fatigue. His PMH includes paroxysmal a fib, MS, lymphedema, hypothyroidism, Graves Disease, arthritis,  lumbar herniated disc (L5), depression and neuropathy. Patient first developed symptoms of MS in 2005 and was diagnosed in 2006. Patient has done PT in the past, but hasn't been seen since 2020 at [facility]. Has not been doing his exercises in the past year. Walk outside to garage to ride lawnmower, walks in house. 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 a couple hours    How long can you stand comfortably? no more than 10 minutes before pain, have to steady self immediately    How long can you walk comfortably? has to use rollator. can walk in a store with a cart    Patient Stated Goals want to get stronger and feel mor comfortable walking around. Patient would like to continue strengthening hips and improve endurance and ability to pace self for safety while walking.    Currently in Pain? No/denies    Pain Onset More than a month ago             Seated BP prior to session, LUE 102/69, HR 80bpm  Ther Ex: Nu Step  49mn level 3, 2 min level 4, 2 min level 5, 1 min level 4, 2 min level 3 cool down  HR 80bpm at start,  peaked at 130bpm at level 5, recovered to 101bpm after cool down. RPE 4/10  STS with staggered stance to bias loading LLE 2x10  Neuro Re-Ed: In // bars: Standing toe taps to 6" 2x10 no UE support each LE, 1x16 alternating LE, SUE needed for alternating and stance on LLE.  Modified tandem stance on airex x30sec each LE Modified tandem airex stance with forward press x12 each LE. Yellow weighted ball with LLE in front, unweighted with RLE in front.  Weight shifts on Airex promoting loading LLE 1x12 with 4sec holds. Tactile and verbal cuing from SPT to promote weight shift and maintaining ankle alignment.    BOSU mini lunge 2x30sec each leg, static stance.    Pt educated throughout session about proper posture and technique with  exercises. Improved exercise technique, movement at target joints, use of target muscles after min to mod verbal, visual, tactile cues.   Patient presents to PT with mild fatigue, still reporting mild symptoms of Afib but gave good effort throughout. Single limb stance and closed chain hip strengthening was challenged today with patient demonstrating improved hip stability in SLS, although fatigues quickly. Largest deficits remaining include LE weakness, SL balance, and gait deviations. Pt will continue to benefit from skilled PT to address these deficits in order to improve functional mobility, decrease falls risk, and improve safety and independence with ADLs and caregiver duties.                       PT Education - 05/24/21 1715     Education Details Exercise technique    Person(s) Educated Patient    Methods Explanation;Demonstration;Tactile cues;Verbal cues    Comprehension Verbalized understanding;Tactile cues required;Returned demonstration;Verbal cues required              PT Short Term Goals - 04/24/21 1432       PT SHORT TERM GOAL #1   Title Patient will be independent in home exercise program to improve strength/mobility for better functional independence with ADLs.    Baseline 8/9: HEP to be given next session, 10/31: patient reports compliance with HEP and would like progressions added next session.    Time 4    Period Weeks    Status Partially Met    Target Date 05/22/21               PT Long Term Goals - 04/24/21 1435       PT LONG TERM GOAL #1   Title Patient will increase FOTO score to equal to or greater than 70%    to demonstrate statistically significant improvement in mobility and quality of life.    Baseline 8/9: 64%; risk adjusted 34%; 03/21/21 FOTO: 45; 04/24/21 FOTO: 49%    Time 12    Period Weeks    Status Partially Met    Target Date 07/17/21      PT LONG TERM GOAL #2   Title Patient will increase Berg Balance score by > 6  points to demonstrate decreased fall risk during functional activities.    Baseline 8/9: BBT 29/56; 03/21/21: 42/56    Time 12    Period Weeks    Status Achieved      PT LONG TERM GOAL #3   Title Patient will increase 10 meter walk test to >1.28ms as to improve gait speed for better community ambulation and to reduce fall risk.    Baseline 8/9: 0.65 m/s with rollator; 10/31: 0.68 m/s with rollator  Time 12    Period Weeks    Status Partially Met    Target Date 07/17/21      PT LONG TERM GOAL #4   Title Patient will increase six minute walk test distance to >1000 for progression to community ambulator and improve gait ability    Baseline 8/9: 505 ft with rollator; 03/21/21: 79f c rollator, 04/24/21: [id number] rollator    Time 12    Period Weeks    Status On-going    Target Date 07/17/21      PT LONG TERM GOAL #5   Title Patient will increase glute medius strength on Left LE from 3-/5 to 4/5 to improve stability, gait mechanics, and functional strength.    Baseline 10/31: 3-/5    Time 12    Period Weeks    Status New    Target Date 07/17/21      Additional Long Term Goals   Additional Long Term Goals Yes      PT LONG TERM GOAL #6   Title Patient will increase Left single leg stance time to 15 seconds or greater to increase safety in shower and independence with ADLs.    Baseline 10/31; 1.83 sec without UE support    Time 12    Period Weeks    Status New    Target Date 07/17/21                   Plan - 05/24/21 1719     Clinical Impression Statement Patient presents to PT with mild fatigue, still reporting mild symptoms of Afib but gave good effort throughout. Single limb stance and closed chain hip strengthening was challenged today with patient demonstrating improved hip stability in SLS, although fatigues quickly. Largest deficits remaining include LE weakness, SL balance, and gait deviations. Pt will continue to benefit from skilled PT to address these deficits  in order to improve functional mobility, decrease falls risk, and improve safety and independence with ADLs and caregiver duties.    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    Rehab Potential Fair    PT Frequency 2x / week    PT Duration 12 weeks    PT Treatment/Interventions ADLs/Self Care Home Management;Aquatic Therapy;Canalith Repostioning;Biofeedback;Cryotherapy;Ultrasound;Traction;Moist Heat;Electrical Stimulation;DME Instruction;Gait training;Therapeutic exercise;Therapeutic activities;Functional mobility training;Stair training;Balance training;Neuromuscular re-education;Patient/family education;Manual techniques;Orthotic Fit/Training;Compression bandaging;Passive range of motion;Vestibular;Taping;Splinting;Energy conservation;Dry needling;Visual/perceptual remediation/compensation    PT Next Visit Plan Update HEP. Continue SL stance training and LE coordination, expand gait training, address glute strengthening/activation at Mat table; hip extension, bridging on bosu ball/uneven surface, marching bridges, active hip extension and clamshells LLE, quadraped and prone hip strengthening.    PT Home Exercise Plan Access Code: [email or web address]: [email or web address]  Added Bridges with BTB and encouraged more walking around the home during commerical breaks when watching TV.    Consulted and Agree  with Plan of Care Patient             Patient will benefit from skilled therapeutic intervention in order to improve the following deficits and impairments:  Abnormal gait, Cardiopulmonary status limiting activity, Decreased activity tolerance, Decreased coordination, Decreased balance, Decreased endurance, Decreased mobility, Decreased strength, Decreased range of motion, Difficulty walking, Impaired  flexibility, Impaired sensation, Postural dysfunction, Improper body mechanics  Visit Diagnosis: Muscle weakness (generalized)  Unsteadiness on feet  Other lack of coordination     Problem List Patient Active Problem List   Diagnosis Date Noted   Persistent atrial fibrillation (HCC)    Graves disease    Abdominal bloating    Atrial fibrillation with RVR ([place]) 10/01/2019   Hyperthyroidism    Atrial fibrillation with rapid ventricular response ([place]) 09/30/2019   Leg edema    Left leg cellulitis    Special screening for malignant neoplasms, colon    Polyp of sigmoid colon    Benign neoplasm of ascending colon    Rectal polyp    Herniated nucleus pulposus, L5-S1 11/09/2015   Low back pain 10/25/2015   Herniated nucleus pulposus, C5-6 right 10/06/2015   Dysesthesia 09/16/2015   Spasticity 09/16/2015   Unsteady gait 09/16/2015   Multiple sclerosis ([place]) 06/08/2011   Arsenio Katz, SPT  This entire session was performed under direct supervision and direction of a licensed therapist/therapist assistant . I have personally read, edited and approve of the note as written.  Janna Arch, PT, DPT  05/24/2021, 5:24 PM  [place] MAIN [facility] SERVICES [address] [place], [place], [address] Phone: [phone]   Fax:  [phone]  Name: Dustin Gray MRN: [id number] Date of Birth: December 03, 1955

## 2021-05-25 ENCOUNTER — Ambulatory Visit: Payer: Medicare PPO | Attending: Neurology | Admitting: Occupational Therapy

## 2021-05-25 ENCOUNTER — Encounter: Payer: Self-pay | Admitting: Occupational Therapy

## 2021-05-25 DIAGNOSIS — M6281 Muscle weakness (generalized): Secondary | ICD-10-CM | POA: Insufficient documentation

## 2021-05-25 DIAGNOSIS — R2681 Unsteadiness on feet: Secondary | ICD-10-CM | POA: Diagnosis not present

## 2021-05-25 DIAGNOSIS — I890 Lymphedema, not elsewhere classified: Secondary | ICD-10-CM | POA: Diagnosis not present

## 2021-05-25 DIAGNOSIS — R2689 Other abnormalities of gait and mobility: Secondary | ICD-10-CM | POA: Insufficient documentation

## 2021-05-26 ENCOUNTER — Encounter: Payer: Self-pay | Admitting: Occupational Therapy

## 2021-05-28 NOTE — Therapy (Signed)
[place] MAIN [facility] SERVICES [address] [place], [place], [address] Phone: [phone]   Fax:  [phone]  Occupational Therapy Evaluation  Patient Details  Name: Dustin Gray MRN: [id number] Date of Birth: [birth year] Referring Provider (OT): Staci Acosta, MD   Encounter Date: 05/25/2021   OT End of Session - 05/28/21 1810     Visit Number 1    Number of Visits 36    Date for OT Re-Evaluation 08/23/21    OT Start Time 6010    OT Stop Time 1219    OT Time Calculation (min) 74 min    Activity Tolerance Patient tolerated treatment well;No increased pain;Patient limited by pain    Behavior During Therapy [facility] for tasks assessed/performed             Past Medical History:  Diagnosis Date   Abscess    groin   Arthritis    lower spine   Erectile dysfunction    Low testosterone    Lumbar herniated disc    L5   Multiple sclerosis (HCC)    Staph aureus infection     Past Surgical History:  Procedure Laterality Date   COLONOSCOPY WITH PROPOFOL N/A 07/04/2016   Procedure: COLONOSCOPY WITH PROPOFOL;  Surgeon: [name], MD;  Location: [place];  Service: Endoscopy;  Laterality: N/A;   [place]   POLYPECTOMY  07/04/2016   Procedure: POLYPECTOMY;  Surgeon: [name], MD;  Location: [place];  Service: Endoscopy;;   TONSILLECTOMY      There were no vitals filed for this visit.   Subjective Assessment - 05/28/21 1752     Subjective  Dustin Gray is referred to Occupatio9nal Therapy by Jacelyn Grip, MD for evaluation and treatment of BLE lymphedema. Dustin Gray reports chronic  leg swelling has been going on for sometime. It reduces overnight in bed, byt no longer resolves. He reports that leg swelling started without known precipitating event, and he denies known family  history of leg swelling. he has not undergone lymphedema treatment in the pastHis goal  today is to explore lymphedema treatment in hopes of reducing swelling and limiting progression.    Pertinent History relevant to LE: HTN, MS, Herniated C-5-C6, L5-S1, chronic low back pain, persistent Afib,Hx LLE cellulitis, Graves disease, HYPERthyroidism, OA    Limitations difficulty walking, impaired functional mobility and transfers, impaired balance, muscle weakness, L>R,, altered sensation, chronic back pain , chronic leg swelling and associated pain, spasticity in legs    Repetition Increases Symptoms    Special Tests +Stemmer sign, L>R; Intake FOTO: 51/100    Patient Stated Goals Learn about lymphedema and explore treatment options    Currently in Pain? Yes    Pain Score --   not rated numerically   Pain Location Leg    Pain Orientation Right;Left    Pain Descriptors / Indicators Aching;Burning;Discomfort;Heaviness;Nagging;Pressure;Spasm;Tightness;Tiring;Other (Comment)   fullness   Pain Type Chronic pain    Pain Onset More than a month ago    Pain Frequency Intermittent    Aggravating Factors  standing, walking, dependent sitting    Pain Relieving Factors none    Effect of Pain on Daily Activities Lymphedema and associated pain limits functional ambulation and mobility, community mobility, basic and instrumental ADLs, work and productive activities, leisure pursuits, social particpation    Pain Location Back    Pain Type Chronic pain    Pain Onset More than  a month ago               [facility] OT Assessment - 05/28/21 1803       Assessment   Medical Diagnosis Mild, Stage II, BLE Lymphedema 2/2 MS related muscle weakness and dependent positioning    Referring Provider (OT) Staci Acosta, MD    Hand Dominance Right    Prior Therapy none      Precautions   Precautions Fall    Precaution Comments Lymphedema Precautions: CARDIAC      Restrictions   Weight Bearing Restrictions No      Balance Screen   Has the patient fallen in the past 6 months Yes    How many times? 2       [place] expects to be discharged to: Private residence    Living Arrangements Spouse/significant other    Available Help at Discharge Family    Type of [place] Access Other (Comment)   1 step on back   [place] One level    Bathroom Shower/Tub [place] Other (comment)   has Rollator but not used. No shower seat. no grab bars in bathroom or handholds at back single step entry   Lives With Spouse      Prior Function   Level of [place] Retired    Leisure mowing the yard, tv, phone games      IADL   Prior Level of Function Shopping needs assistance    Shopping Needs to be accompanied on any shopping trip    Prior Level of Function Light Housekeeping needs assistance    [employer] Performs light daily tasks but cannot maintain acceptable level of cleanliness    Prior Level of Function Meal Prep needs assistance    Meal Prep Able to complete simple warm meal prep;Able to complete simple cold meal and snack prep    Prior Level of Function Community Mobility needs assistance    Community Mobility Relies on family or friends for transportation      Mobility   Mobility Status History of falls      Vision - History   Baseline Vision Wears glasses all the time      Cognition   Overall Cognitive Status Within Functional Limits for tasks assessed      Observation/Other Assessments   Observations BLE redness below the knees, L>R, BLE skin dry and flaking, Both legs swollen, L ~ 15%> than R, non=pitting, signs of infection absent, + Stemmer, skin temp cool below knees    Focus on Therapeutic Outcomes (FOTO)  Intake 05/25/21: 51/100    Outcome Measures BLE comparative limb volumetrics TRA Rx visit 1      Posture/Postural Control   Posture/Postural Control Postural limitations      Sensation   Light Touch Impaired by gross assessment      ROM /  Strength   AROM / PROM / Strength AROM;Strength      AROM   Overall AROM  Within functional limits for tasks performed    AROM Assessment Site Knee;Ankle;Hip;Shoulder;Elbow;Forearm;Wrist;Finger                      OT Treatments/Exercises (OP) - 05/28/21 1809       Manual Therapy   Manual Therapy Edema management  OT Education - 05/28/21 1809     Education Details Provided Pt education regarding lymphatic structure and function, etiology, onset patterns and stages of progression. Taught interaction between blood circulatory system and lymphatic circulation.Discussed  impact of gravity and obesity on lymphatic function. Outlined Complete Decongestive Therapy (CDT)  as standard of care and provided in depth information regarding 4 primary components of both Intensive and Self Management Phases, including Manual Lymph Drainage (MLD), compression wrapping and garments, skin care, and therapeutic exercise.   [place] Plate discussion with parents re need for frequent attendance and high burden of care and impact of co morbidities. We discussed  Importance of daily, ongoing LE self-care essential to retaining clinical gains , limiting infection risk and limiting progression.    Person(s) Educated Patient    Methods Explanation;Demonstration;Handout    Comprehension Verbalized understanding;Returned demonstration                 OT Long Term Goals - 05/28/21 1820       OT LONG TERM GOAL #1   Title Given this patient's Intake score of 51/100 on the functional outcomes FOTO tool, patient will experience an increase in function of 5 points to improve basic and instrumental ADLs performance, including lymphedema self-care.    Baseline Max A    Time 12    Period Weeks    Status New      OT LONG TERM GOAL #2   Title Pt will be able to apply knee length, multi-layer, short stretch compression wraps to one limb at a time using gradient techniques with MAX  CG ASSISTANCE to decrease limb volume, to limit infection risk, and to limit lymphedema progression.    Baseline Dependent    Time 4    Period Days    Status New      OT LONG TERM GOAL #3   Title lymphedema self-care.  Pt will demonstrate understanding of lymphedema prevention strategies by identifying and discussing 5 precautions using printed reference (modified assistance) to reduce risk of progression and to limit infection risk.    Baseline Max A    Time 4    Period Days    Status New    Target Date --   4th OT Rx visit     OT LONG TERM GOAL #4   Title Pt will achieve at least a 10% limb volume reduction in bilateral legs to return limb to normal size and shape,  to limit lymphedema progression and to limit infection risk.    Baseline BLE comparative limb volumetrics TBA at 1st OT Rx visit. BY visual assessment both legs swollen by 10-15%, L>R    Time 12    Period Weeks    Status New    Target Date 08/23/21      OT LONG TERM GOAL #5   Title With MAX CG ASSISTANCE Pt will achieve and sustain a least 85% compliance with all 4 LE self-care home program components throughout Intensive Phase CDT, including modified simple self-MLD, daily skin inspection and care, lymphatic pumping the ex, 23/7 compression wraps to sustain clinical gains made in CDT and to limit lymphedema progression and further functional decline.    Baseline Dependent    Time 12    Period Weeks    Status New    Target Date 08/23/21                   Plan - 05/28/21 1810     Clinical Impression Statement Dustin "Pat"  Gray presents with mild, stage II, BLE lymphedema 2/2 decreased musle strength resulting from progressive MS. Exacerbating factors inclode suspected venous insufficiency and dependent positioning. Chronic leg swelling has worsened over time and no longer resolves with elevation. IChronic, progressive leg swelling and associated pain limits patients functional performance in all occupational  domains, including functional ambulation, mobility and transfers, work and productive activities, [employer], social participation and role performance. To date Dustin. Gray has not experienced leg wounds, but he has had at least one episode of cellulitis as a result of lymphedema. Dustin Gray will benefit from Intensive and Self=Management Phase Complete Decongestive Therapy(CDT)  to limit progression and control leg swelling and pain,  to reduce infection risk and improve tissue health, and to achieve maximum  indepdence with occupational performance. Without skilled Occupational Therapy for CDT physical and sensory symptoms will progress and further functional decline is expected.    OT Occupational Profile and History Comprehensive Assessment- Review of records and extensive additional review of physical, cognitive, psychosocial history related to current functional performance    Occupational performance deficits (Please refer to evaluation for details): ADL's;IADL's;Work;Leisure;Social Participation;Other   role performance   Body Structure / Function / Physical Skills ADL;Edema;Skin integrity;Flexibility;Pain;ROM;Decreased knowledge of use of DME;Scar mobility;IADL    Rehab Potential Good    Clinical Decision Making Several treatment options, min-mod task modification necessary    Comorbidities Affecting Occupational Performance: Presence of comorbidities impacting occupational performance    Modification or Assistance to Complete Evaluation  Min-Moderate modification of tasks or assist with assess necessary to complete eval    OT Frequency 2x / week    OT Duration 12 weeks   and PRN for follow along   OT Treatment/Interventions Self-care/ADL training;Therapeutic exercise;Manual Therapy;Coping strategies training;Therapeutic activities;Manual lymph drainage;DME and/or AE instruction;Compression bandaging;Other (comment);Patient/family education   skin care to limit infection risk   Plan  Complete Decongestive Therapy (CDT) One leg at a time to limit fall LLE first. Manual lymphatic drainage (MLD), skin care, ther ex, compression wraps, then    OT Home Exercise Plan Pt verbalized understanding that he requires assistance with all lymphedema home care components, especially compression wrapping, between visits for optimal prognosis. Without assistance prognosis is poor    Recommended Other Services In insurance benefits available, garmentsConsider advanced sequential pneumatic compression device (Flexitouch) to maximize independence w lymphedema self-care at home over time. Pt unable to perform simple self-mld    Consulted and Agree with Plan of Care Patient             Patient will benefit from skilled therapeutic intervention in order to improve the following deficits and impairments:   Body Structure / Function / Physical Skills: ADL, Edema, Skin integrity, Flexibility, Pain, ROM, Decreased knowledge of use of DME, Scar mobility, IADL       Visit Diagnosis: Lymphedema, not elsewhere classified - Plan: Ot plan of care cert/re-cert    Problem List Patient Active Problem List   Diagnosis Date Noted   Persistent atrial fibrillation (HCC)    Graves disease    Abdominal bloating    Atrial fibrillation with RVR ([place]) 10/01/2019   Hyperthyroidism    Atrial fibrillation with rapid ventricular response ([place]) 09/30/2019   Leg edema    Left leg cellulitis    Special screening for malignant neoplasms, colon    Polyp of sigmoid colon    Benign neoplasm of ascending colon    Rectal polyp    Herniated nucleus pulposus, L5-S1 11/09/2015   Low back  pain 10/25/2015   Herniated nucleus pulposus, C5-6 right 10/06/2015   Dysesthesia 09/16/2015   Spasticity 09/16/2015   Unsteady gait 09/16/2015   Multiple sclerosis ([place]) 06/08/2011   Dustin Spearman, MS, OTR/L, [facility] 05/28/21 6:27 PM  [place] MAIN [facility] SERVICES [address]  [place], [place], [address] Phone: [phone]   Fax:  [phone]  Name: SANTINO KINSELLA MRN: [id number] Date of Birth: 1955-07-18

## 2021-05-29 ENCOUNTER — Ambulatory Visit: Payer: Medicare PPO

## 2021-05-31 ENCOUNTER — Ambulatory Visit: Payer: Medicare PPO

## 2021-06-05 ENCOUNTER — Ambulatory Visit: Payer: Medicare PPO

## 2021-06-07 ENCOUNTER — Ambulatory Visit: Payer: Medicare PPO

## 2021-06-08 ENCOUNTER — Other Ambulatory Visit: Payer: Self-pay

## 2021-06-08 ENCOUNTER — Ambulatory Visit: Payer: Medicare PPO | Admitting: Occupational Therapy

## 2021-06-08 DIAGNOSIS — M6281 Muscle weakness (generalized): Secondary | ICD-10-CM | POA: Diagnosis not present

## 2021-06-08 DIAGNOSIS — I890 Lymphedema, not elsewhere classified: Secondary | ICD-10-CM | POA: Diagnosis not present

## 2021-06-08 DIAGNOSIS — R2681 Unsteadiness on feet: Secondary | ICD-10-CM | POA: Diagnosis not present

## 2021-06-08 DIAGNOSIS — R2689 Other abnormalities of gait and mobility: Secondary | ICD-10-CM | POA: Diagnosis not present

## 2021-06-08 NOTE — Patient Instructions (Signed)

## 2021-06-08 NOTE — Therapy (Signed)
[place] MAIN [facility] SERVICES [address] [place], [place], [address] Phone: [phone]   Fax:  [phone]  Occupational Therapy Treatment  Patient Details  Name: Dustin Gray MRN: [id number] Date of Birth: [birth year] Referring Provider (OT): Staci Acosta, MD   Encounter Date: 06/08/2021   OT End of Session - 06/08/21 1130     Visit Number 2    Number of Visits 36    Date for OT Re-Evaluation 08/23/21    OT Start Time 4967    OT Stop Time 1215    OT Time Calculation (min) 70 min    Activity Tolerance Patient tolerated treatment well;No increased pain;Patient limited by pain    Behavior During Therapy [facility] for tasks assessed/performed             Past Medical History:  Diagnosis Date   Abscess    groin   Arthritis    lower spine   Erectile dysfunction    Low testosterone    Lumbar herniated disc    L5   Multiple sclerosis (HCC)    Staph aureus infection     Past Surgical History:  Procedure Laterality Date   COLONOSCOPY WITH PROPOFOL N/A 07/04/2016   Procedure: COLONOSCOPY WITH PROPOFOL;  Surgeon: [name], MD;  Location: [place];  Service: Endoscopy;  Laterality: N/A;   [place]   POLYPECTOMY  07/04/2016   Procedure: POLYPECTOMY;  Surgeon: [name], MD;  Location: [place];  Service: Endoscopy;;   TONSILLECTOMY      There were no vitals filed for this visit.   Subjective Assessment - 06/08/21 1131     Subjective  Dustin Gray presents for OT visit 2/36 to address BLE lymphedema. Pt presents with scratches from dog and lymphorrhea on anterior L leg distal to knee. Leg is mildly reddened, but not warm. RLE is dry and flakey. Pt reports 3/10 heaviness and fullness in BLE today.    Pertinent History relevant to LE: HTN, MS, Herniated C-5-C6, L5-S1, chronic low back pain, persistent Afib,Hx LLE cellulitis, Graves disease, HYPERthyroidism, OA     Limitations difficulty walking, impaired functional mobility and transfers, impaired balance, muscle weakness, L>R,, altered sensation, chronic back pain , chronic leg swelling and associated pain, spasticity in legs    Repetition Increases Symptoms    Special Tests +Stemmer sign, L>R; Intake FOTO: 51/100    Patient Stated Goals Learn about lymphedema and explore treatment options    Pain Onset More than a month ago    Pain Onset More than a month ago                 LYMPHEDEMA/ONCOLOGY QUESTIONNAIRE - 06/08/21 1301       Lymphedema Assessments   Lymphedema Assessments Lower extremities      Right Lower Extremity Lymphedema   Other RLE (dominant)  limb volume from base of toes to tibial tuberosity (A-D) = 4713.6 ml.      Left Lower Extremity Lymphedema   Other LLE limb volume from base of toes to tibial tuberosity (A-D) = 4889.3 mlml.    Other Limb volume differential (LVD)= 3.73%, L>R                     OT Treatments/Exercises (OP) - 06/08/21 1301       Manual Therapy   Manual Therapy Edema management;Compression Bandaging    Edema Management Initial BLE comparative limb  volumetrics    Compression Bandaging 4 layer knee length wrap , excluding foot , from ankle to popliteal fossa using stockinett as bas layer, then single layer Rosidal foam under 1 each 8,10 and 12 cm wide short stretch bandages.                    OT Education - 06/08/21 1258     Education Details Continued Pt/ CG edu for lymphedema self care  and home program throughout session. Topics include multilayer, gradient compression wrapping, simple self-MLD, therapeutic lymphatic pumping exercises, skin/nail care, risk reduction factors and LE precautions, compression garments/recommendations and wear and care schedule and compression garment donning / doffing using assistive devices. All questions answered to the Pt's satisfaction, and Pt demonstrates understanding by report.     Person(s) Educated Patient    Methods Explanation;Demonstration;Handout    Comprehension Verbalized understanding;Returned demonstration;Need further instruction                 OT Long Term Goals - 05/28/21 1820       OT LONG TERM GOAL #1   Title Given this patients Intake score of 51/100 on the functional outcomes FOTO tool, patient will experience an increase in function of 5 points to improve basic and instrumental ADLs performance, including lymphedema self-care.    Baseline Dustin A    Time 12    Period Weeks    Status New      OT LONG TERM GOAL #2   Title Pt will be able to apply knee length, multi-layer, short stretch compression wraps to one limb at a time using gradient techniques with Dustin CG ASSISTANCE to decrease limb volume, to limit infection risk, and to limit lymphedema progression.    Baseline Dependent    Time 4    Period Days    Status New      OT LONG TERM GOAL #3   Title lymphedema self-care.  Pt will demonstrate understanding of lymphedema prevention strategies by identifying and discussing 5 precautions using printed reference (modified assistance) to reduce risk of progression and to limit infection risk.    Baseline Dustin A    Time 4    Period Days    Status New    Target Date --   4th OT Rx visit     OT LONG TERM GOAL #4   Title Pt will achieve at least a 10% limb volume reduction in bilateral legs to return limb to normal size and shape,  to limit lymphedema progression and to limit infection risk.    Baseline BLE comparative limb volumetrics TBA at 1st OT Rx visit. BY visual assessment both legs swollen by 10-15%, L>R    Time 12    Period Weeks    Status New    Target Date 08/23/21      OT LONG TERM GOAL #5   Title With Dustin CG ASSISTANCE Pt will achieve and sustain a least 85% compliance with all 4 LE self-care home program components throughout Intensive Phase CDT, including modified simple self-MLD, daily skin inspection and care, lymphatic  pumping the ex, 23/7 compression wraps to sustain clinical gains made in CDT and to limit lymphedema progression and further functional decline.    Baseline Dependent    Time 12    Period Weeks    Status New    Target Date 08/23/21                   Plan -  06/08/21 1245     Clinical Impression Statement Dustin Gray presents with dog scratches and lymphorrhea on anterior left leg just distal to knee. He also has abraision on distal L leg where smallere dog scratched him. Leg is reddened , but jnot warm to palpation. Pt agrees to carefully monitor skin condition and to bathe and dry skin daily and apply antibacterial first aid cream as he is higher risk for infection w lymphedema. Initial BLE comparative limb volumetrics reveal limb volume differential measuring 3.73%, L>R below the knee. Although this LVD is not atypical for normal limb volumes, this this indicated both legs are swollen symetrically. Pt did not bring lace up shoes, so we did not apply compression wraps to L foot to limit falls risk. We used gradient technique with wraps from ankle to popliteal fossa, and are relying on shoe for foot compression. Pt agrees to perform ankle pumps frequently and elevate legs as often as possible. He agrees to remove wraps    in 24 hours, or sooner if SOB occurs or if he becomes uncomfortable. Cont as per POC.    OT Occupational Profile and History Comprehensive Assessment- Review of records and extensive additional review of physical, cognitive, psychosocial history related to current functional performance    Occupational performance deficits (Please refer to evaluation for details): ADL's;IADL's;Work;Leisure;Social Participation;Other   role performance   Body Structure / Function / Physical Skills ADL;Edema;Skin integrity;Flexibility;Pain;ROM;Decreased knowledge of use of DME;Scar mobility;IADL    Rehab Potential Good    Clinical Decision Making Several treatment options, min-mod task  modification necessary    Comorbidities Affecting Occupational Performance: Presence of comorbidities impacting occupational performance    Modification or Assistance to Complete Evaluation  Min-Moderate modification of tasks or assist with assess necessary to complete eval    OT Frequency 2x / week    OT Duration 12 weeks   and PRN for follow along   OT Treatment/Interventions Self-care/ADL training;Therapeutic exercise;Manual Therapy;Coping strategies training;Therapeutic activities;Manual lymph drainage;DME and/or AE instruction;Compression bandaging;Other (comment);Patient/family education   skin care to limit infection risk   Plan Complete Decongestive Therapy (CDT) One leg at a time to limit fall LLE first. Manual lymphatic drainage (MLD), skin care, ther ex, compression wraps, then    OT Home Exercise Plan Pt verbalized understanding that he requires assistance with all lymphedema home care components, especially compression wrapping, between visits for optimal prognosis. Without assistance prognosis is poor    Recommended Other Services In insurance benefits available, garmentsConsider advanced sequential pneumatic compression device (Flexitouch) to maximize independence w lymphedema self-care at home over time. Pt unable to perform simple self-mld    Consulted and Agree with Plan of Care Patient             Patient will benefit from skilled therapeutic intervention in order to improve the following deficits and impairments:   Body Structure / Function / Physical Skills: ADL, Edema, Skin integrity, Flexibility, Pain, ROM, Decreased knowledge of use of DME, Scar mobility, IADL       Visit Diagnosis: Lymphedema, not elsewhere classified    Problem List Patient Active Problem List   Diagnosis Date Noted   Persistent atrial fibrillation (HCC)    Graves disease    Abdominal bloating    Atrial fibrillation with RVR ([place]) 10/01/2019   Hyperthyroidism    Atrial fibrillation with  rapid ventricular response ([place]) 09/30/2019   Leg edema    Left leg cellulitis    Special screening for malignant neoplasms, colon  Polyp of sigmoid colon    Benign neoplasm of ascending colon    Rectal polyp    Herniated nucleus pulposus, L5-S1 11/09/2015   Low back pain 10/25/2015   Herniated nucleus pulposus, C5-6 right 10/06/2015   Dysesthesia 09/16/2015   Spasticity 09/16/2015   Unsteady gait 09/16/2015   Multiple sclerosis ([place]) 06/08/2011   Andrey Spearman, MS, OTR/L, [facility] 06/08/21 1:04 PM    [place] MAIN [facility] SERVICES [address] [place], [place], [address] Phone: [phone]   Fax:  [phone]  Name: Dustin Gray MRN: [id number] Date of Birth: November 25, 1955

## 2021-06-12 ENCOUNTER — Other Ambulatory Visit: Payer: Self-pay

## 2021-06-12 ENCOUNTER — Ambulatory Visit: Payer: Medicare PPO

## 2021-06-12 DIAGNOSIS — R2681 Unsteadiness on feet: Secondary | ICD-10-CM | POA: Diagnosis not present

## 2021-06-12 DIAGNOSIS — R2689 Other abnormalities of gait and mobility: Secondary | ICD-10-CM

## 2021-06-12 DIAGNOSIS — I890 Lymphedema, not elsewhere classified: Secondary | ICD-10-CM | POA: Diagnosis not present

## 2021-06-12 DIAGNOSIS — M6281 Muscle weakness (generalized): Secondary | ICD-10-CM

## 2021-06-12 NOTE — Therapy (Signed)
[place] MAIN [facility] SERVICES [address] [place], [place], [address] Phone: [phone]   Fax:  [phone]  Physical Therapy Treatment  Patient Details  Name: Dustin Gray MRN: [id number] Date of Birth: [birth year] No data recorded  Encounter Date: 06/12/2021   PT End of Session - 06/12/21 1438     Visit Number 26    Number of Visits 43    Date for PT Re-Evaluation 07/17/21    Authorization Type [employer]    Authorization Time Period 01/31/21-04/25/21    PT Start Time 1430    PT Stop Time 1514    PT Time Calculation (min) 44 min    Equipment Utilized During Treatment Gait belt    Activity Tolerance Patient tolerated treatment well;No increased pain;Patient limited by fatigue    Behavior During Therapy [facility] for tasks assessed/performed             Past Medical History:  Diagnosis Date   Abscess    groin   Arthritis    lower spine   Erectile dysfunction    Low testosterone    Lumbar herniated disc    L5   Multiple sclerosis (HCC)    Staph aureus infection     Past Surgical History:  Procedure Laterality Date   COLONOSCOPY WITH PROPOFOL N/A 07/04/2016   Procedure: COLONOSCOPY WITH PROPOFOL;  Surgeon: [name], MD;  Location: [place];  Service: Endoscopy;  Laterality: N/A;   [place]   POLYPECTOMY  07/04/2016   Procedure: POLYPECTOMY;  Surgeon: [name], MD;  Location: [place];  Service: Endoscopy;;   TONSILLECTOMY      There were no vitals filed for this visit.   Subjective Assessment - 06/12/21 1437     Subjective Patient fell two weeks ago when he was ill walking to the bathroom when he sick. He lost all energy and had to call EMS to get back up. Is returning back to therapy after absence from 05/24/21.    Pertinent History Patient presents to physical therapy for unsteadiness, walking instability, fatigue. His PMH includes paroxysmal a fib, MS,  lymphedema, hypothyroidism, Graves Disease, arthritis, lumbar herniated disc (L5), depression and neuropathy. Patient first developed symptoms of MS in 2005 and was diagnosed in 2006. Patient has done PT in the past, but hasn't been seen since 2020 at [facility]. Has not been doing his exercises in the past year. Walk outside to garage to ride lawnmower, walks in house. 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 a couple hours    How long can you stand comfortably? no more than 10 minutes before pain, have to steady self immediately    How long can you walk comfortably? has to use rollator. can walk in a store with a cart    Patient Stated Goals want to get stronger and feel mor comfortable walking around. Patient would like to continue strengthening hips and improve endurance and ability to pace self for safety while walking.    Currently in Pain? No/denies            Patient fell two weeks ago when he was ill walking to the bathroom when he sick. He lost all energy and had to call EMS to get back up. Is returning back to therapy after absence  from 05/24/21.    BP at start of session: 111/64   Treatment:   Nustep Lvl 4 RPM; RPM> 60 4 minutes for cardiovascular/muscloskeletal challenge ; HR elevation to 121   In // bars:  6" step toe taps with BUE support 12x each LE 6" step lateral toe taps 12x each LE with BUE support  6" step up/down with BUE support 8x each LE 15x hip extension each LE with BUE support  Airex pad 6" step modified tandem stance 30 seconds each LE placement x2 each LE  Sit to stand 10x from standard height chair.  ambulate without UE support 10 ft x 2 trials    Pt educated throughout session about proper posture and technique with exercises. Improved exercise technique, movement at target joints, use of target muscles after min  to mod verbal, visual, tactile cues.   Patient returning to therapy after a near three week absence from illness. Patient has significant weakness compared to previous sessions due to illness. He remains highly motivated despite fatigue. Increased knee hyperextension noted in LLE with prolonged weightbearing. . Pt will continue to benefit from skilled PT to address these deficits in order to improve functional mobility, decrease falls risk, and improve safety and independence with ADLs and caregiver duties.              PT Education - 06/12/21 1438     Education Details exercise technique, body mechanics    Person(s) Educated Patient    Methods Explanation;Demonstration;Tactile cues;Verbal cues    Comprehension Verbalized understanding;Returned demonstration;Verbal cues required;Tactile cues required              PT Short Term Goals - 04/24/21 1432       PT SHORT TERM GOAL #1   Title Patient will be independent in home exercise program to improve strength/mobility for better functional independence with ADLs.    Baseline 8/9: HEP to be given next session, 10/31: patient reports compliance with HEP and would like progressions added next session.    Time 4    Period Weeks    Status Partially Met    Target Date 05/22/21               PT Long Term Goals - 04/24/21 1435       PT LONG TERM GOAL #1   Title Patient will increase FOTO score to equal to or greater than 70%    to demonstrate statistically significant improvement in mobility and quality of life.    Baseline 8/9: 64%; risk adjusted 34%; 03/21/21 FOTO: 45; 04/24/21 FOTO: 49%    Time 12    Period Weeks    Status Partially Met    Target Date 07/17/21      PT LONG TERM GOAL #2   Title Patient will increase Berg Balance score by > 6 points to demonstrate decreased fall risk during functional activities.    Baseline 8/9: BBT 29/56; 03/21/21: 42/56    Time 12    Period Weeks    Status Achieved      PT LONG  TERM GOAL #3   Title Patient will increase 10 meter walk test to >1.31ms as to improve gait speed for better community ambulation and to reduce fall risk.    Baseline 8/9: 0.65 m/s with rollator; 10/31: 0.68 m/s with rollator    Time 12    Period Weeks    Status Partially Met    Target Date 07/17/21      PT LONG  TERM GOAL #4   Title Patient will increase six minute walk test distance to >1000 for progression to community ambulator and improve gait ability    Baseline 8/9: 505 ft with rollator; 03/21/21: 736f c rollator, 04/24/21: 7520fc rollator    Time 12    Period Weeks    Status On-going    Target Date 07/17/21      PT LONG TERM GOAL #5   Title Patient will increase glute medius strength on Left LE from 3-/5 to 4/5 to improve stability, gait mechanics, and functional strength.    Baseline 10/31: 3-/5    Time 12    Period Weeks    Status New    Target Date 07/17/21      Additional Long Term Goals   Additional Long Term Goals Yes      PT LONG TERM GOAL #6   Title Patient will increase Left single leg stance time to 15 seconds or greater to increase safety in shower and independence with ADLs.    Baseline 10/31; 1.83 sec without UE support    Time 12    Period Weeks    Status New    Target Date 07/17/21                   Plan - 06/12/21 1451     Clinical Impression Statement Patient returning to therapy after a near three week absence from illness. Patient has significant weakness compared to previous sessions due to illness. He remains highly motivated despite fatigue. Increased knee hyperextension noted in LLE with prolonged weightbearing. . Pt will continue to benefit from skilled PT to address these deficits in order to improve functional mobility, decrease falls risk, and improve safety and independence with ADLs and caregiver duties.    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    Rehab Potential Fair    PT Frequency 2x / week    PT Duration 12 weeks    PT Treatment/Interventions ADLs/Self Care Home Management;Aquatic Therapy;Canalith Repostioning;Biofeedback;Cryotherapy;Ultrasound;Traction;Moist Heat;Electrical Stimulation;DME Instruction;Gait training;Therapeutic exercise;Therapeutic activities;Functional mobility training;Stair training;Balance training;Neuromuscular re-education;Patient/family education;Manual techniques;Orthotic Fit/Training;Compression bandaging;Passive range of motion;Vestibular;Taping;Splinting;Energy conservation;Dry needling;Visual/perceptual remediation/compensation    PT Next Visit Plan Update HEP. Continue SL stance training and LE coordination, expand gait training, address glute strengthening/activation at Mat table; hip extension, bridging on bosu ball/uneven surface, marching bridges, active hip extension and clamshells LLE, quadraped and prone hip strengthening.    PT Home Exercise Plan Access Code: [email or web address]: [email or web address]  Added Bridges with BTB and encouraged more walking around the home during commerical breaks when watching TV.    Consulted and Agree with Plan of Care Patient             Patient will benefit from skilled therapeutic intervention in order to improve the following deficits and impairments:  Abnormal gait, Cardiopulmonary status limiting activity, Decreased activity  tolerance, Decreased coordination, Decreased balance, Decreased endurance, Decreased mobility, Decreased strength, Decreased range of motion, Difficulty walking, Impaired flexibility, Impaired sensation, Postural dysfunction, Improper body mechanics  Visit Diagnosis: Muscle weakness (generalized)  Unsteadiness on feet  Other abnormalities of gait and mobility     Problem List Patient Active Problem List   Diagnosis  Date Noted   Persistent atrial fibrillation (HCC)    Graves disease    Abdominal bloating    Atrial fibrillation with RVR ([place]) 10/01/2019   Hyperthyroidism    Atrial fibrillation with rapid ventricular response ([place]) 09/30/2019   Leg edema    Left leg cellulitis    Special screening for malignant neoplasms, colon    Polyp of sigmoid colon    Benign neoplasm of ascending colon    Rectal polyp    Herniated nucleus pulposus, L5-S1 11/09/2015   Low back pain 10/25/2015   Herniated nucleus pulposus, C5-6 right 10/06/2015   Dysesthesia 09/16/2015   Spasticity 09/16/2015   Unsteady gait 09/16/2015   Multiple sclerosis ([place]) 06/08/2011    Janna Arch, PT, DPT  06/12/2021, 3:15 PM  [place] MAIN [facility] SERVICES [address] [place], [place], [address] Phone: [phone]   Fax:  [phone]  Name: Dustin Gray MRN: [id number] Date of Birth: 1956/01/06

## 2021-06-14 ENCOUNTER — Other Ambulatory Visit: Payer: Self-pay

## 2021-06-14 ENCOUNTER — Ambulatory Visit: Payer: Medicare PPO

## 2021-06-14 DIAGNOSIS — R2681 Unsteadiness on feet: Secondary | ICD-10-CM | POA: Diagnosis not present

## 2021-06-14 DIAGNOSIS — I890 Lymphedema, not elsewhere classified: Secondary | ICD-10-CM | POA: Diagnosis not present

## 2021-06-14 DIAGNOSIS — M6281 Muscle weakness (generalized): Secondary | ICD-10-CM

## 2021-06-14 DIAGNOSIS — R2689 Other abnormalities of gait and mobility: Secondary | ICD-10-CM | POA: Diagnosis not present

## 2021-06-14 NOTE — Therapy (Signed)
[place] MAIN [facility] SERVICES [address] [place], [place], [address] Phone: [phone]   Fax:  [phone]  Physical Therapy Treatment  Patient Details  Name: Dustin Gray MRN: [id number] Date of Birth: 01/18/1956 No data recorded  Encounter Date: 06/14/2021   PT End of Session - 06/14/21 1633     Visit Number 27    Number of Visits 43    Date for PT Re-Evaluation 07/17/21    Authorization Type [employer]    Authorization Time Period 01/31/21-04/25/21    PT Start Time 1431    PT Stop Time 1515    PT Time Calculation (min) 44 min    Equipment Utilized During Treatment Gait belt    Activity Tolerance Patient tolerated treatment well;No increased pain;Patient limited by fatigue    Behavior During Therapy [facility] for tasks assessed/performed             Past Medical History:  Diagnosis Date   Abscess    groin   Arthritis    lower spine   Erectile dysfunction    Low testosterone    Lumbar herniated disc    L5   Multiple sclerosis (HCC)    Staph aureus infection     Past Surgical History:  Procedure Laterality Date   COLONOSCOPY WITH PROPOFOL N/A 07/04/2016   Procedure: COLONOSCOPY WITH PROPOFOL;  Surgeon: [name], MD;  Location: [place];  Service: Endoscopy;  Laterality: N/A;   [place]   POLYPECTOMY  07/04/2016   Procedure: POLYPECTOMY;  Surgeon: [name], MD;  Location: [place];  Service: Endoscopy;;   TONSILLECTOMY      There were no vitals filed for this visit.   Subjective Assessment - 06/14/21 1432     Subjective Patient reports no falls since last visit. Still struggles at times with catching his breath and coughing but is "feeling better every day". Reports to PT wearing BLE compression sleeves. Will be driving to beach and back this weekend and will potentially be staying at beach house next week but does not have AD that can fit in his car.     Pertinent History Patient presents to physical therapy for unsteadiness, walking instability, fatigue. His PMH includes paroxysmal a fib, MS, lymphedema, hypothyroidism, Graves Disease, arthritis, lumbar herniated disc (L5), depression and neuropathy. Patient first developed symptoms of MS in 2005 and was diagnosed in 2006. Patient has done PT in the past, but hasn't been seen since 2020 at [facility]. Has not been doing his exercises in the past year. Walk outside to garage to ride lawnmower, walks in house. 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 a couple hours    How long can you stand comfortably? no more than 10 minutes before pain, have to steady self immediately    How long can you walk comfortably? has to use rollator. can walk in a store with a cart    Patient Stated Goals want to get stronger and feel mor comfortable walking around. Patient would like to continue strengthening hips and improve endurance and ability to pace self for safety while walking.           BP seated prior to session; L arm 104/68, HR 70bpm  Nu Step: Lvl 5 x5 minutes for cardiovascular endurance, cuing for  RPM >60   In // bars:   Hip hikes on green airex x15 each LE, BUE support and mod tactile and verbal cuing required for form and to prevent knee hyperextension.  Seated Hip abduction step outs with GTB 1x15 RLE Seated hip abduction with GTB LLE 1x15  Step ups at 6 in step with BUE support. X15 leading with each LE. Cuing for increased hip flexion when lifting LLE for toe clearance. Pt reports typically leading with LLE when ascending steps at beach house bc he is worried about tripping even though RLE is stronger. Patient was educated on the increased stability when leading with RLE and encouraged to lead with RLE at home, especially if he is feeling particularly  weak.  Active assisted seated marching on LLE 2x10, mod A by SPT needed for full range  Side stepping x4 lengths of // bars with BUE support, cuing for pressing into ground with stance leg to promote active hip abduction/ pelvic stability  Pt educated throughout session about proper posture and technique with exercises. Improved exercise technique, movement at target joints, use of target muscles after min to mod verbal, visual, tactile cues.  Patient with great effort throughout PT today despite still recovering from illness and feeling fatigued. Patient continues to demonstrate increased LE weakness and AROM L>R, gait deviations, and reduced tolerance for functional mobility. Patient reported they are unable to fit Rollator in car and will be traveling out of town for holidays; was advised by SPT to consider purchasing a second AD to keep at beach house to increase safety when staying there, especially given recent fall and illness. Patient was receptive to idea if able to trial AD that can fit in car. Will continue to benefit from skilled PT to improve strength, AROM, gait, and balance to increase functional capacity and safety with household and community ambulation.                  PT Education - 06/14/21 1632     Education Details Exercise technique, AD use when feeling fatigued or when out of town, Geophysicist/field seismologist) Educated Patient    Methods Explanation;Demonstration;Tactile cues;Verbal cues    Comprehension Verbalized understanding;Returned demonstration;Verbal cues required;Tactile cues required              PT Short Term Goals - 04/24/21 1432       PT SHORT TERM GOAL #1   Title Patient will be independent in home exercise program to improve strength/mobility for better functional independence with ADLs.    Baseline 8/9: HEP to be given next session, 10/31: patient reports compliance with HEP and would like progressions added next session.    Time 4     Period Weeks    Status Partially Met    Target Date 05/22/21               PT Long Term Goals - 04/24/21 1435       PT LONG TERM GOAL #1   Title Patient will increase FOTO score to equal to or greater than 70%    to demonstrate statistically significant improvement in mobility and quality of life.    Baseline 8/9: 64%; risk adjusted 34%; 03/21/21 FOTO: 45; 04/24/21 FOTO: 49%    Time 12    Period Weeks    Status Partially Met    Target Date 07/17/21      PT LONG TERM GOAL #2   Title Patient will increase Berg Balance score  by > 6 points to demonstrate decreased fall risk during functional activities.    Baseline 8/9: BBT 29/56; 03/21/21: 42/56    Time 12    Period Weeks    Status Achieved      PT LONG TERM GOAL #3   Title Patient will increase 10 meter walk test to >1.67m/s as to improve gait speed for better community ambulation and to reduce fall risk.    Baseline 8/9: 0.65 m/s with rollator; 10/31: 0.68 m/s with rollator    Time 12    Period Weeks    Status Partially Met    Target Date 07/17/21      PT LONG TERM GOAL #4   Title Patient will increase six minute walk test distance to >1000 for progression to community ambulator and improve gait ability    Baseline 8/9: 505 ft with rollator; 03/21/21: 734ft c rollator, 04/24/21: 771ft c rollator    Time 12    Period Weeks    Status On-going    Target Date 07/17/21      PT LONG TERM GOAL #5   Title Patient will increase glute medius strength on Left LE from 3-/5 to 4/5 to improve stability, gait mechanics, and functional strength.    Baseline 10/31: 3-/5    Time 12    Period Weeks    Status New    Target Date 07/17/21      Additional Long Term Goals   Additional Long Term Goals Yes      PT LONG TERM GOAL #6   Title Patient will increase Left single leg stance time to 15 seconds or greater to increase safety in shower and independence with ADLs.    Baseline 10/31; 1.83 sec without UE support    Time 12     Period Weeks    Status New    Target Date 07/17/21                   Plan - 06/14/21 1634     Clinical Impression Statement Patient with great effort throughout PT today despite still recovering from illness and feeling fatigued. Patient continues to demonstrate increased LE weakness and AROM L>R, gait deviations, and reduced tolerance for functional mobility. Patient reported they are unable to fit Rollator in car and will be traveling out of town for holidays; was advised by SPT to consider purchasing a second AD to keep at beach house to increase safety when staying there, especially given recent fall and illness. Patient was receptive to idea if able to trial AD that can fit in car. Will continue to benefit from skilled PT to improve strength, AROM, gait, and balance to increase functional capacity and safety with household and community ambulation.    Personal Factors and Comorbidities Age;Comorbidity 3+;Fitness;Past/Current Experience;Time since onset of injury/illness/exacerbation    Comorbidities aroxysmal a fib, MS, lymphedema, hypothyroidism, Graves Disease, arthritis, lumbar herniated disc (L5), depression and neuropathy    Examination-Activity Limitations Bed Mobility;Bend;Caring for Others;Carry;Reach Overhead;Locomotion Level;Lift;Hygiene/Grooming;Dressing;Squat;Stairs;Stand;Toileting;Transfers    Examination-Participation Restrictions Cleaning;Community Activity;Laundry;Occupation;Shop;Volunteer;Yard Work    Merchant navy officer Evolving/Moderate complexity    Rehab Potential Fair    PT Frequency 2x / week    PT Duration 12 weeks    PT Treatment/Interventions ADLs/Self Care Home Management;Aquatic Therapy;Canalith Repostioning;Biofeedback;Cryotherapy;Ultrasound;Traction;Moist Heat;Electrical Stimulation;DME Instruction;Gait training;Therapeutic exercise;Therapeutic activities;Functional mobility training;Stair training;Balance training;Neuromuscular  re-education;Patient/family education;Manual techniques;Orthotic Fit/Training;Compression bandaging;Passive range of motion;Vestibular;Taping;Splinting;Energy conservation;Dry needling;Visual/perceptual remediation/compensation    PT Next Visit Plan Trial AD for ambulation that can fit in  pt's car for upcoming trip to beach house    PT Home Exercise Plan Access Code: [email or web address] URL: [email or web address]  Added Bridges with BTB and encouraged more walking around the home during commerical breaks when watching TV.    Consulted and Agree with Plan of Care Patient             Patient will benefit from skilled therapeutic intervention in order to improve the following deficits and impairments:  Abnormal gait, Cardiopulmonary status limiting activity, Decreased activity tolerance, Decreased coordination, Decreased balance, Decreased endurance, Decreased mobility, Decreased strength, Decreased range of motion, Difficulty walking, Impaired flexibility, Impaired sensation, Postural dysfunction, Improper body mechanics  Visit Diagnosis: Unsteadiness on feet  Muscle weakness (generalized)  Other abnormalities of gait and mobility     Problem List Patient Active Problem List   Diagnosis Date Noted   Persistent atrial fibrillation (HCC)    Graves disease    Abdominal bloating    Atrial fibrillation with RVR ([place]) 10/01/2019   Hyperthyroidism    Atrial fibrillation with rapid ventricular response ([place]) 09/30/2019   Leg edema    Left leg cellulitis    Special screening for malignant neoplasms, colon    Polyp of sigmoid colon    Benign neoplasm of ascending colon    Rectal polyp    Herniated nucleus pulposus, L5-S1 11/09/2015   Low back pain 10/25/2015   Herniated nucleus pulposus, C5-6 right 10/06/2015   Dysesthesia 09/16/2015   Spasticity 09/16/2015   Unsteady gait 09/16/2015   Multiple sclerosis ([place]) 06/08/2011   Arsenio Katz, SPT  This entire session was  performed under direct supervision and direction of a licensed therapist/therapist assistant . I have personally read, edited and approve of the note as written.  Janna Arch, PT, DPT  06/14/2021, 4:36 PM  [place] MAIN [facility] SERVICES [address] [place], [place], [address] Phone: [phone]   Fax:  [phone]  Name: MYKELL RAWL MRN: [id number] Date of Birth: Aug 31, 1955

## 2021-06-21 ENCOUNTER — Ambulatory Visit: Payer: Medicare PPO

## 2021-06-21 ENCOUNTER — Other Ambulatory Visit: Payer: Self-pay

## 2021-06-21 MED ORDER — DILTIAZEM HCL ER 120 MG PO CP12: 120.0000 mg | ORAL_CAPSULE | Freq: Two times a day (BID) | ORAL | 5 refills | Status: DC

## 2021-06-28 ENCOUNTER — Ambulatory Visit: Payer: Medicare PPO | Attending: Neurology | Admitting: Occupational Therapy

## 2021-06-28 ENCOUNTER — Ambulatory Visit: Payer: Medicare PPO

## 2021-06-28 ENCOUNTER — Other Ambulatory Visit: Payer: Self-pay

## 2021-06-28 DIAGNOSIS — R2681 Unsteadiness on feet: Secondary | ICD-10-CM | POA: Insufficient documentation

## 2021-06-28 DIAGNOSIS — R278 Other lack of coordination: Secondary | ICD-10-CM | POA: Diagnosis not present

## 2021-06-28 DIAGNOSIS — M6281 Muscle weakness (generalized): Secondary | ICD-10-CM

## 2021-06-28 DIAGNOSIS — R2689 Other abnormalities of gait and mobility: Secondary | ICD-10-CM | POA: Insufficient documentation

## 2021-06-28 DIAGNOSIS — R262 Difficulty in walking, not elsewhere classified: Secondary | ICD-10-CM | POA: Diagnosis not present

## 2021-06-28 DIAGNOSIS — R269 Unspecified abnormalities of gait and mobility: Secondary | ICD-10-CM | POA: Diagnosis not present

## 2021-06-28 DIAGNOSIS — I890 Lymphedema, not elsewhere classified: Secondary | ICD-10-CM | POA: Insufficient documentation

## 2021-06-28 NOTE — Therapy (Signed)
[place] MAIN [facility] SERVICES [address] [place], [place], [address] Phone: [phone]   Fax:  [phone]  Physical Therapy Treatment  Patient Details  Name: Dustin Gray MRN: [id number] Date of Birth: 09-14-1955 No data recorded  Encounter Date: 06/28/2021   PT End of Session - 06/28/21 1413     Visit Number 28    Number of Visits 43    Date for PT Re-Evaluation 07/17/21    Authorization Type [employer]    Authorization Time Period 01/31/21-04/25/21    PT Start Time 1410    PT Stop Time 1449    PT Time Calculation (min) 39 min    Equipment Utilized During Treatment Gait belt    Activity Tolerance Patient tolerated treatment well;No increased pain;Patient limited by fatigue    Behavior During Therapy [facility] for tasks assessed/performed             Past Medical History:  Diagnosis Date   Abscess    groin   Arthritis    lower spine   Erectile dysfunction    Low testosterone    Lumbar herniated disc    L5   Multiple sclerosis (HCC)    Staph aureus infection     Past Surgical History:  Procedure Laterality Date   COLONOSCOPY WITH PROPOFOL N/A 07/04/2016   Procedure: COLONOSCOPY WITH PROPOFOL;  Surgeon: [name], MD;  Location: [place];  Service: Endoscopy;  Laterality: N/A;   [place]   POLYPECTOMY  07/04/2016   Procedure: POLYPECTOMY;  Surgeon: [name], MD;  Location: [place];  Service: Endoscopy;;   TONSILLECTOMY      There were no vitals filed for this visit.   Subjective Assessment - 06/28/21 1601     Subjective Patient had lymphedema prior to PT session making him slightly late for his PT appointment. Had a hard time with his mom's steps at her beach house but no falls.    Pertinent History Patient presents to physical therapy for unsteadiness, walking instability, fatigue. His PMH includes paroxysmal a fib, MS, lymphedema, hypothyroidism, Graves  Disease, arthritis, lumbar herniated disc (L5), depression and neuropathy. Patient first developed symptoms of MS in 2005 and was diagnosed in 2006. Patient has done PT in the past, but hasn't been seen since 2020 at [facility]. Has not been doing his exercises in the past year. Walk outside to garage to ride lawnmower, walks in house. 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 a couple hours    How long can you stand comfortably? no more than 10 minutes before pain, have to steady self immediately    How long can you walk comfortably? has to use rollator. can walk in a store with a cart    Patient Stated Goals want to get stronger and feel mor comfortable walking around. Patient would like to continue strengthening hips and improve endurance and ability to pace self for safety while walking.    Currently in Pain? No/denies               BP seated prior to session; R arm  109/80       In // bars:   Step over half foam roller 10x each LE; BUE support   Three way hip hike: flexion, abduction, extension with BUE  support, stabilization provided to LLE when weightbearing due to hyperextension. 10x each direction each LE  Modified forward lunges 4x length of // bars with BUE support    10x STS from standard height chair  Side stepping x4 lengths of // bars with BUE support, cuing for pressing into ground with stance leg to promote active hip abduction/ pelvic stability   Standing with CGA next to support surface:  Airex pad: static stand 30 seconds x 2 trials, noticeable trembling of ankles/LE's with fatigue and challenge to maintain stability Airex pad: horizontal head turns 30 seconds scanning room 10x ; cueing for arc of motion  Airex pad: vertical head turns 30 seconds, cueing for arc of motion, noticeable sway with upward gaze increasing demand  on ankle righting reaction musculature Airex pad: one foot on 6" step one foot on airex pad, hold position for 30 seconds, switch legs, 2x each LE; Airex pad 4" step lateral step up/down airex pad 10x each direction  Pt educated throughout session about proper posture and technique with exercises. Improved exercise technique, movement at target joints, use of target muscles after min to mod verbal, visual, tactile cues.      Patient is highly motivated throughout physical therapy session. His LLE is challenged with maintaining weightbearing without hyperextending this session indicating further need for future sessions to focus on this task. Unstable surfaces are improving with decreased episodes of instability. Will continue to benefit from skilled PT to improve strength, AROM, gait, and balance to increase functional capacity and safety with household and community ambulation.                   PT Education - 06/28/21 1358     Education Details exercise technique, body mechanics    Person(s) Educated Patient    Methods Explanation;Demonstration;Tactile cues;Verbal cues    Comprehension Verbalized understanding;Returned demonstration;Verbal cues required;Tactile cues required              PT Short Term Goals - 04/24/21 1432       PT SHORT TERM GOAL #1   Title Patient will be independent in home exercise program to improve strength/mobility for better functional independence with ADLs.    Baseline 8/9: HEP to be given next session, 10/31: patient reports compliance with HEP and would like progressions added next session.    Time 4    Period Weeks    Status Partially Met    Target Date 05/22/21               PT Long Term Goals - 04/24/21 1435       PT LONG TERM GOAL #1   Title Patient will increase FOTO score to equal to or greater than 70%    to demonstrate statistically significant improvement in mobility and quality of life.    Baseline 8/9: 64%; risk  adjusted 34%; 03/21/21 FOTO: 45; 04/24/21 FOTO: 49%    Time 12    Period Weeks    Status Partially Met    Target Date 07/17/21      PT LONG TERM GOAL #2   Title Patient will increase Berg Balance score by > 6 points to demonstrate decreased fall risk during functional activities.    Baseline 8/9: BBT 29/56; 03/21/21: 42/56    Time 12    Period Weeks    Status Achieved      PT LONG TERM GOAL #3   Title Patient will increase 10 meter walk test to >1.78ms as to  improve gait speed for better community ambulation and to reduce fall risk.    Baseline 8/9: 0.65 m/s with rollator; 10/31: 0.68 m/s with rollator    Time 12    Period Weeks    Status Partially Met    Target Date 07/17/21      PT LONG TERM GOAL #4   Title Patient will increase six minute walk test distance to >1000 for progression to community ambulator and improve gait ability    Baseline 8/9: 505 ft with rollator; 03/21/21: 731f c rollator, 04/24/21: 757fc rollator    Time 12    Period Weeks    Status On-going    Target Date 07/17/21      PT LONG TERM GOAL #5   Title Patient will increase glute medius strength on Left LE from 3-/5 to 4/5 to improve stability, gait mechanics, and functional strength.    Baseline 10/31: 3-/5    Time 12    Period Weeks    Status New    Target Date 07/17/21      Additional Long Term Goals   Additional Long Term Goals Yes      PT LONG TERM GOAL #6   Title Patient will increase Left single leg stance time to 15 seconds or greater to increase safety in shower and independence with ADLs.    Baseline 10/31; 1.83 sec without UE support    Time 12    Period Weeks    Status New    Target Date 07/17/21                   Plan - 06/28/21 1603     Clinical Impression Statement Patient is highly motivated throughout physical therapy session. His LLE is challenged with maintaining weightbearing without hyperextending this session indicating further need for future sessions to focus on  this task. Unstable surfaces are improving with decreased episodes of instability. Will continue to benefit from skilled PT to improve strength, AROM, gait, and balance to increase functional capacity and safety with household and community ambulation.    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    Rehab Potential Fair    PT Frequency 2x / week    PT Duration 12 weeks    PT Treatment/Interventions ADLs/Self Care Home Management;Aquatic Therapy;Canalith Repostioning;Biofeedback;Cryotherapy;Ultrasound;Traction;Moist Heat;Electrical Stimulation;DME Instruction;Gait training;Therapeutic exercise;Therapeutic activities;Functional mobility training;Stair training;Balance training;Neuromuscular re-education;Patient/family education;Manual techniques;Orthotic Fit/Training;Compression bandaging;Passive range of motion;Vestibular;Taping;Splinting;Energy conservation;Dry needling;Visual/perceptual remediation/compensation    PT Next Visit Plan Trial AD for ambulation that can fit in pt's car for upcoming trip to beach house    PT Home Exercise Plan Access Code: [email or web address]: [email or web address]  Added [place] with BTB and encouraged more walking around the home during commerical breaks when watching TV.    Consulted and Agree with Plan of Care Patient             Patient will benefit from skilled  therapeutic intervention in order to improve the following deficits and impairments:  Abnormal gait, Cardiopulmonary status limiting activity, Decreased activity tolerance, Decreased coordination, Decreased balance, Decreased endurance, Decreased mobility, Decreased strength, Decreased range of motion, Difficulty walking, Impaired flexibility, Impaired sensation, Postural dysfunction, Improper body mechanics  Visit Diagnosis:  Unsteadiness on feet  Muscle weakness (generalized)  Other abnormalities of gait and mobility     Problem List Patient Active Problem List   Diagnosis Date Noted   Persistent atrial fibrillation (HCC)    Graves disease    Abdominal bloating    Atrial fibrillation with RVR ([place]) 10/01/2019   Hyperthyroidism    Atrial fibrillation with rapid ventricular response ([place]) 09/30/2019   Leg edema    Left leg cellulitis    Special screening for malignant neoplasms, colon    Polyp of sigmoid colon    Benign neoplasm of ascending colon    Rectal polyp    Herniated nucleus pulposus, L5-S1 11/09/2015   Low back pain 10/25/2015   Herniated nucleus pulposus, C5-6 right 10/06/2015   Dysesthesia 09/16/2015   Spasticity 09/16/2015   Unsteady gait 09/16/2015   Multiple sclerosis ([place]) 06/08/2011   Janna Arch, PT, DPT  06/28/2021, 4:04 PM  [place] MAIN [facility] SERVICES [address] [place], [place], [address] Phone: [phone]   Fax:  [phone]  Name: Dustin Gray MRN: [id number] Date of Birth: [birth year]

## 2021-06-28 NOTE — Patient Instructions (Signed)

## 2021-06-28 NOTE — Therapy (Signed)
[place] MAIN [facility] SERVICES [address] [place], [place], [address] Phone: [phone]   Fax:  [phone]  Occupational Therapy Treatment  Patient Details  Name: Dustin Gray MRN: [id number] Date of Birth: [birth year] Referring Provider (OT): Staci Acosta, MD   Encounter Date: 06/28/2021   OT End of Session - 06/28/21 1321     Visit Number 3    Number of Visits 36    Date for OT Re-Evaluation 08/23/21    OT Start Time 0108    Activity Tolerance Patient tolerated treatment well;No increased pain    Behavior During Therapy WFL for tasks assessed/performed             Past Medical History:  Diagnosis Date   Abscess    groin   Arthritis    lower spine   Erectile dysfunction    Low testosterone    Lumbar herniated disc    L5   Multiple sclerosis (HCC)    Staph aureus infection     Past Surgical History:  Procedure Laterality Date   COLONOSCOPY WITH PROPOFOL N/A 07/04/2016   Procedure: COLONOSCOPY WITH PROPOFOL;  Surgeon: [name], MD;  Location: [place];  Service: Endoscopy;  Laterality: N/A;   [place]   POLYPECTOMY  07/04/2016   Procedure: POLYPECTOMY;  Surgeon: [name], MD;  Location: [place];  Service: Endoscopy;;   TONSILLECTOMY      There were no vitals filed for this visit.   Subjective Assessment - 06/28/21 1323     Subjective  Dustin Gray presents for OT visit 2/36 to address BLE lymphedema. Pt presents with scratches from dog and lymphorrhea on anterior L leg distal to knee. Leg is mildly reddened, but not warm. RLE is dry and flakey. Pt reports 3/10 heaviness and fullness in BLE today.    Pertinent History relevant to LE: HTN, MS, Herniated C-5-C6, L5-S1, chronic low back pain, persistent Afib,Hx LLE cellulitis, Graves disease, HYPERthyroidism, OA    Limitations difficulty walking, impaired functional mobility and transfers, impaired  balance, muscle weakness, L>R,, altered sensation, chronic back pain , chronic leg swelling and associated pain, spasticity in legs    Repetition Increases Symptoms    Special Tests +Stemmer sign, L>R; Intake FOTO: 51/100    Patient Stated Goals Learn about lymphedema and explore treatment options    Pain Onset More than a month ago    Pain Onset More than a month ago                          OT Treatments/Exercises (OP) - 06/28/21 1620       ADLs   ADL Education Given Yes      Manual Therapy   Manual Therapy Edema management;Compression Bandaging;Manual Lymphatic Drainage (MLD)    Manual Lymphatic Drainage (MLD) MLD to LLE/LLQ using short neck sequence, deep abdominal lymphatics, functional inguinal LN and proximal to distal J Strokes from thigh to f oot.    Compression Bandaging layer knee length wrap , excluding foot , from ankle to popliteal fossa using stockinett as bas layer, then single layer Rosidal foam under 1 each 8,10 and 12 cm wide short stretch bandages.                    OT Education - 06/28/21 1621     Education Details Continued Pt/ CG edu for lymphedema self  care  and home program throughout session. Topics include multilayer, gradient compression wrapping, simple self-MLD, therapeutic lymphatic pumping exercises, skin/nail care, risk reduction factors and LE precautions, compression garments/recommendations and wear and care schedule and compression garment donning / doffing using assistive devices. All questions answered to the Pt's satisfaction, and Pt demonstrates understanding by report.    Person(s) Educated Patient    Methods Explanation;Demonstration;Handout    Comprehension Verbalized understanding;Returned demonstration;Need further instruction                 OT Long Term Goals - 05/28/21 1820       OT LONG TERM GOAL #1   Title Given this patients Intake score of 51/100 on the functional outcomes FOTO tool, patient  will experience an increase in function of 5 points to improve basic and instrumental ADLs performance, including lymphedema self-care.    Baseline Max A    Time 12    Period Weeks    Status New      OT LONG TERM GOAL #2   Title Pt will be able to apply knee length, multi-layer, short stretch compression wraps to one limb at a time using gradient techniques with MAX CG ASSISTANCE to decrease limb volume, to limit infection risk, and to limit lymphedema progression.    Baseline Dependent    Time 4    Period Days    Status New      OT LONG TERM GOAL #3   Title lymphedema self-care.  Pt will demonstrate understanding of lymphedema prevention strategies by identifying and discussing 5 precautions using printed reference (modified assistance) to reduce risk of progression and to limit infection risk.    Baseline Max A    Time 4    Period Days    Status New    Target Date --   4th OT Rx visit     OT LONG TERM GOAL #4   Title Pt will achieve at least a 10% limb volume reduction in bilateral legs to return limb to normal size and shape,  to limit lymphedema progression and to limit infection risk.    Baseline BLE comparative limb volumetrics TBA at 1st OT Rx visit. BY visual assessment both legs swollen by 10-15%, L>R    Time 12    Period Weeks    Status New    Target Date 08/23/21      OT LONG TERM GOAL #5   Title With MAX CG ASSISTANCE Pt will achieve and sustain a least 85% compliance with all 4 LE self-care home program components throughout Intensive Phase CDT, including modified simple self-MLD, daily skin inspection and care, lymphatic pumping the ex, 23/7 compression wraps to sustain clinical gains made in CDT and to limit lymphedema progression and further functional decline.    Baseline Dependent    Time 12    Period Weeks    Status New    Target Date 08/23/21                   Plan - 06/28/21 1519     Clinical Impression Statement Fraser Din presents without short  stretch compression wraps issued at last visit. He tells me he wrapped his Lleg once over holiday break with good results. Commenced MLD to LLE/ZLLQ today completing proximal sequences and thigh. Reviewed lymphatic anatomy and function related to MLD   with Pt. Applied 2nd set of multi layer compression stockings to LLE as established last session. Cont as per POC.    OT  Occupational Profile and History Comprehensive Assessment- Review of records and extensive additional review of physical, cognitive, psychosocial history related to current functional performance    Occupational performance deficits (Please refer to evaluation for details): ADL's;IADL's;Work;Leisure;Social Participation;Other   role performance   Body Structure / Function / Physical Skills ADL;Edema;Skin integrity;Flexibility;Pain;ROM;Decreased knowledge of use of DME;Scar mobility;IADL    Rehab Potential Good    Clinical Decision Making Several treatment options, min-mod task modification necessary    Comorbidities Affecting Occupational Performance: Presence of comorbidities impacting occupational performance    Modification or Assistance to Complete Evaluation  Min-Moderate modification of tasks or assist with assess necessary to complete eval    OT Frequency 2x / week    OT Duration 12 weeks   and PRN for follow along   OT Treatment/Interventions Self-care/ADL training;Therapeutic exercise;Manual Therapy;Coping strategies training;Therapeutic activities;Manual lymph drainage;DME and/or AE instruction;Compression bandaging;Other (comment);Patient/family education   skin care to limit infection risk   Plan Complete Decongestive Therapy (CDT) One leg at a time to limit fall LLE first. Manual lymphatic drainage (MLD), skin care, ther ex, compression wraps, then    OT Home Exercise Plan Pt verbalized understanding that he requires assistance with all lymphedema home care components, especially compression wrapping, between visits for  optimal prognosis. Without assistance prognosis is poor    Recommended Other Services In insurance benefits available, garmentsConsider advanced sequential pneumatic compression device (Flexitouch) to maximize independence w lymphedema self-care at home over time. Pt unable to perform simple self-mld    Consulted and Agree with Plan of Care Patient             Patient will benefit from skilled therapeutic intervention in order to improve the following deficits and impairments:   Body Structure / Function / Physical Skills: ADL, Edema, Skin integrity, Flexibility, Pain, ROM, Decreased knowledge of use of DME, Scar mobility, IADL       Visit Diagnosis: Lymphedema, not elsewhere classified    Problem List Patient Active Problem List   Diagnosis Date Noted   Persistent atrial fibrillation (HCC)    Graves disease    Abdominal bloating    Atrial fibrillation with RVR ([place]) 10/01/2019   Hyperthyroidism    Atrial fibrillation with rapid ventricular response ([place]) 09/30/2019   Leg edema    Left leg cellulitis    Special screening for malignant neoplasms, colon    Polyp of sigmoid colon    Benign neoplasm of ascending colon    Rectal polyp    Herniated nucleus pulposus, L5-S1 11/09/2015   Low back pain 10/25/2015   Herniated nucleus pulposus, C5-6 right 10/06/2015   Dysesthesia 09/16/2015   Spasticity 09/16/2015   Unsteady gait 09/16/2015   Multiple sclerosis ([place]) 06/08/2011   Andrey Spearman, MS, OTR/L, CLT-LANA 06/28/21 4:23 PM   [place] MAIN [facility] SERVICES [address] [place], [place], [address] Phone: [phone]   Fax:  [phone]  Name: GAYLON BENTZ MRN: [id number] Date of Birth: [birth year]

## 2021-07-04 ENCOUNTER — Other Ambulatory Visit: Payer: Self-pay

## 2021-07-04 ENCOUNTER — Ambulatory Visit: Payer: Medicare PPO | Admitting: Physical Therapy

## 2021-07-04 ENCOUNTER — Ambulatory Visit: Payer: Medicare PPO | Admitting: Occupational Therapy

## 2021-07-04 ENCOUNTER — Encounter: Payer: Self-pay | Admitting: Physical Therapy

## 2021-07-04 DIAGNOSIS — R262 Difficulty in walking, not elsewhere classified: Secondary | ICD-10-CM | POA: Diagnosis not present

## 2021-07-04 DIAGNOSIS — I890 Lymphedema, not elsewhere classified: Secondary | ICD-10-CM | POA: Diagnosis not present

## 2021-07-04 DIAGNOSIS — R278 Other lack of coordination: Secondary | ICD-10-CM

## 2021-07-04 DIAGNOSIS — R2689 Other abnormalities of gait and mobility: Secondary | ICD-10-CM

## 2021-07-04 DIAGNOSIS — R269 Unspecified abnormalities of gait and mobility: Secondary | ICD-10-CM | POA: Diagnosis not present

## 2021-07-04 DIAGNOSIS — R2681 Unsteadiness on feet: Secondary | ICD-10-CM

## 2021-07-04 DIAGNOSIS — M6281 Muscle weakness (generalized): Secondary | ICD-10-CM

## 2021-07-04 NOTE — Therapy (Signed)
[place] MAIN [facility] SERVICES [address] [place], [place], [address] Phone: [phone]   Fax:  [phone]  Physical Therapy Treatment  Patient Details  Name: Dustin Gray MRN: [id number] Date of Birth: 06-08-1956 No data recorded  Encounter Date: 07/04/2021   PT End of Session - 07/04/21 1358     Visit Number 29    Number of Visits 43    Date for PT Re-Evaluation 07/17/21    Authorization Type [employer]    Authorization Time Period 01/31/21-04/25/21    PT Start Time 1405    PT Stop Time 1445    PT Time Calculation (min) 40 min    Equipment Utilized During Treatment Gait belt    Activity Tolerance Patient tolerated treatment well;No increased pain;Patient limited by fatigue    Behavior During Therapy [facility] for tasks assessed/performed             Past Medical History:  Diagnosis Date   Abscess    groin   Arthritis    lower spine   Erectile dysfunction    Low testosterone    Lumbar herniated disc    L5   Multiple sclerosis (HCC)    Staph aureus infection     Past Surgical History:  Procedure Laterality Date   COLONOSCOPY WITH PROPOFOL N/A 07/04/2016   Procedure: COLONOSCOPY WITH PROPOFOL;  Surgeon: [name], MD;  Location: [place];  Service: Endoscopy;  Laterality: N/A;   [place]   POLYPECTOMY  07/04/2016   Procedure: POLYPECTOMY;  Surgeon: [name], MD;  Location: [place];  Service: Endoscopy;;   TONSILLECTOMY      There were no vitals filed for this visit.   Subjective Assessment - 07/04/21 1622     Subjective Pt reprots doing well with no significant changes since last session. Pt reprots he has continued furniture surfing/using walls around his home when ambulating but uses rolator occassionally.    Pertinent History Patient presents to physical therapy for unsteadiness, walking instability, fatigue. His PMH includes paroxysmal a fib, MS,  lymphedema, hypothyroidism, Graves Disease, arthritis, lumbar herniated disc (L5), depression and neuropathy. Patient first developed symptoms of MS in 2005 and was diagnosed in 2006. Patient has done PT in the past, but hasn't been seen since 2020 at [facility]. Has not been doing his exercises in the past year. Walk outside to garage to ride lawnmower, walks in house. 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 a couple hours    How long can you stand comfortably? no more than 10 minutes before pain, have to steady self immediately    How long can you walk comfortably? has to use rollator. can walk in a store with a cart    Patient Stated Goals want to get stronger and feel mor comfortable walking around. Patient would like to continue strengthening hips and improve endurance and ability to pace self for safety while walking.    Currently in Pain? No/denies               In // bars:   Step over hurdle, 1/2 foam roller and 2 inch step x 3 times through.  -increased difficulty with L LE as trailing LE due to foot clearance requirements of task   On airex hip: flexion, abduction, extension  with RUE support, cues to prevent hyperextension and cues for proper form. 10x each direction    10x STS from standard height chair  Toe taps to hedgehogs on 6 in step  -cues for slow controlled movements   Side stepping x4 lengths of // bars with BUE support, cues for maintain slight knee extension/ slight squat position.    Standing with CGA next to support surface:    Airex pad: horizontal head turns 30 seconds scanning room 10x ; cueing for arc of motion   Airex pad: vertical head turns 30 seconds, cueing for arc of motion, noticeable sway with upward gaze increasing demand on ankle righting reaction musculature  Airex pad: one foot on 6" step one foot on  airex pad, hold position for 30 seconds, switch legs, 2x each LE;  Airex pad 4" step lateral step up/down airex pad 2x5  each direction   Pt educated throughout session about proper posture and technique with exercises. Improved exercise technique, movement at target joints, use of target muscles after min to mod verbal, visual, tactile cues.   Pt required occasional rest breaks due fatigue, PT was quick to ask when pt appeared to be fatiguing in order to prevent excessive fatigue.  Unless otherwise stated, SBA was provided and gait belt donned in order to ensure pt safety                       PT Education - 07/04/21 1623     Education Details Benefits of specific interventions    Person(s) Educated Patient    Methods Explanation    Comprehension Verbalized understanding              PT Short Term Goals - 04/24/21 1432       PT SHORT TERM GOAL #1   Title Patient will be independent in home exercise program to improve strength/mobility for better functional independence with ADLs.    Baseline 8/9: HEP to be given next session, 10/31: patient reports compliance with HEP and would like progressions added next session.    Time 4    Period Weeks    Status Partially Met    Target Date 05/22/21               PT Long Term Goals - 04/24/21 1435       PT LONG TERM GOAL #1   Title Patient will increase FOTO score to equal to or greater than 70%    to demonstrate statistically significant improvement in mobility and quality of life.    Baseline 8/9: 64%; risk adjusted 34%; 03/21/21 FOTO: 45; 04/24/21 FOTO: 49%    Time 12    Period Weeks    Status Partially Met    Target Date 07/17/21      PT LONG TERM GOAL #2   Title Patient will increase Berg Balance score by > 6 points to demonstrate decreased fall risk during functional activities.    Baseline 8/9: BBT 29/56; 03/21/21: 42/56    Time 12    Period Weeks    Status Achieved      PT LONG TERM GOAL #3    Title Patient will increase 10 meter walk test to >1.45ms as to improve gait speed for better community ambulation and to reduce fall risk.    Baseline 8/9: 0.65 m/s with rollator; 10/31: 0.68 m/s with rollator    Time 12    Period Weeks    Status Partially Met  Target Date 07/17/21      PT LONG TERM GOAL #4   Title Patient will increase six minute walk test distance to >1000 for progression to community ambulator and improve gait ability    Baseline 8/9: 505 ft with rollator; 03/21/21: [id number] c rollator, 04/24/21: [id number] rollator    Time 12    Period Weeks    Status On-going    Target Date 07/17/21      PT LONG TERM GOAL #5   Title Patient will increase glute medius strength on Left LE from 3-/5 to 4/5 to improve stability, gait mechanics, and functional strength.    Baseline 10/31: 3-/5    Time 12    Period Weeks    Status New    Target Date 07/17/21      Additional Long Term Goals   Additional Long Term Goals Yes      PT LONG TERM GOAL #6   Title Patient will increase Left single leg stance time to 15 seconds or greater to increase safety in shower and independence with ADLs.    Baseline 10/31; 1.83 sec without UE support    Time 12    Period Weeks    Status New    Target Date 07/17/21                   Plan - 07/04/21 1359     Clinical Impression Statement Patient demonstrates excellent motivation throughout physical therapy session.  Patient does require frequent rest breaks throughout session to avoid excessive fatigue and patient is quick to alert therapist when rest as needed.  Patient demonstrated improved ability to prevent hyperextension on the left lower extremity this session will continue to focus on this task in future sessions.  Patient also continues to improve with later transition from even to 1 even surfaces such as Airex pad however he does expend a lot of energy with this task and requires rest breaks and increased frequency when performing  dynamic lower extremity balance tasks.  Patient will continue to benefit from skilled physical therapy intervention in order to improve his strength, ambulatory capacity, balance, and increase his overall function and safety with everyday ambulation.    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    Rehab Potential Fair    PT Frequency 2x / week    PT Duration 12 weeks    PT Treatment/Interventions ADLs/Self Care Home Management;Aquatic Therapy;Canalith Repostioning;Biofeedback;Cryotherapy;Ultrasound;Traction;Moist Heat;Electrical Stimulation;DME Instruction;Gait training;Therapeutic exercise;Therapeutic activities;Functional mobility training;Stair training;Balance training;Neuromuscular re-education;Patient/family education;Manual techniques;Orthotic Fit/Training;Compression bandaging;Passive range of motion;Vestibular;Taping;Splinting;Energy conservation;Dry needling;Visual/perceptual remediation/compensation    PT Home Exercise Plan Access Code: [email or web address]: [email or web address]  Added [place] with BTB and encouraged more walking around the home during commerical breaks when watching TV.    Consulted and Agree with Plan of Care Patient             Patient will benefit from skilled therapeutic intervention in order to improve the following deficits and impairments:  Abnormal gait,  Cardiopulmonary status limiting activity, Decreased activity tolerance, Decreased coordination, Decreased balance, Decreased endurance, Decreased mobility, Decreased strength, Decreased range of motion, Difficulty walking, Impaired flexibility, Impaired sensation, Postural dysfunction, Improper body mechanics  Visit Diagnosis: Unsteadiness  on feet  Muscle weakness (generalized)  Other abnormalities of gait and mobility  Other lack of coordination     Problem List Patient Active Problem List   Diagnosis Date Noted   Persistent atrial fibrillation (HCC)    Graves disease    Abdominal bloating    Atrial fibrillation with RVR ([place]) 10/01/2019   Hyperthyroidism    Atrial fibrillation with rapid ventricular response ([place]) 09/30/2019   Leg edema    Left leg cellulitis    Special screening for malignant neoplasms, colon    Polyp of sigmoid colon    Benign neoplasm of ascending colon    Rectal polyp    Herniated nucleus pulposus, L5-S1 11/09/2015   Low back pain 10/25/2015   Herniated nucleus pulposus, C5-6 right 10/06/2015   Dysesthesia 09/16/2015   Spasticity 09/16/2015   Unsteady gait 09/16/2015   Multiple sclerosis ([place]) 06/08/2011    Particia Lather, PT 07/04/2021, 4:27 PM  [place] MAIN [facility] SERVICES [address] [place], [place], [address] Phone: [phone]   Fax:  [phone]  Name: Dustin Gray MRN: [id number] Date of Birth: 12/29/1955

## 2021-07-05 NOTE — Patient Instructions (Signed)

## 2021-07-05 NOTE — Therapy (Signed)
[place] MAIN [facility] SERVICES [address] [place], [place], [address] Phone: [phone]   Fax:  [phone]  Occupational Therapy Treatment  Patient Details  Name: Dustin Gray MRN: [id number] Date of Birth: [birth year] Referring Provider (OT): Staci Acosta, MD   Encounter Date: 07/04/2021   OT End of Session - 07/04/21 1316     Visit Number 4    Number of Visits 36    Date for OT Re-Evaluation 08/23/21    OT Start Time 0100    OT Stop Time 0205    OT Time Calculation (min) 65 min    Activity Tolerance Patient tolerated treatment well;No increased pain    Behavior During Therapy WFL for tasks assessed/performed             Past Medical History:  Diagnosis Date   Abscess    groin   Arthritis    lower spine   Erectile dysfunction    Low testosterone    Lumbar herniated disc    L5   Multiple sclerosis (HCC)    Staph aureus infection     Past Surgical History:  Procedure Laterality Date   COLONOSCOPY WITH PROPOFOL N/A 07/04/2016   Procedure: COLONOSCOPY WITH PROPOFOL;  Surgeon: [name], MD;  Location: [place];  Service: Endoscopy;  Laterality: N/A;   [place]   POLYPECTOMY  07/04/2016   Procedure: POLYPECTOMY;  Surgeon: [name], MD;  Location: [place];  Service: Endoscopy;;   TONSILLECTOMY      There were no vitals filed for this visit.   Subjective Assessment - 07/04/21 1317     Subjective  Dustin Gray presents for OT visit 2/36 to address BLE lymphedema. Pt presents with scratches from dog and lymphorrhea on anterior L leg distal to knee. Leg is mildly reddened, but not warm. RLE is dry and flakey. Pt reports 3/10 heaviness and fullness in BLE today.    Pertinent History relevant to LE: HTN, MS, Herniated C-5-C6, L5-S1, chronic low back pain, persistent Afib,Hx LLE cellulitis, Graves disease, HYPERthyroidism, OA    Limitations difficulty  walking, impaired functional mobility and transfers, impaired balance, muscle weakness, L>R,, altered sensation, chronic back pain , chronic leg swelling and associated pain, spasticity in legs    Repetition Increases Symptoms    Special Tests +Stemmer sign, L>R; Intake FOTO: 51/100    Patient Stated Goals Learn about lymphedema and explore treatment options    Pain Onset More than a month ago    Pain Onset More than a month ago                          OT Treatments/Exercises (OP) - 07/05/21 0820       ADLs   ADL Education Given Yes      Manual Therapy   Manual Therapy Edema management;Compression Bandaging    Compression Bandaging 4 layer knee length compression wrap , including foot , from base of toes to popliteal fossa using stockinett as base layer, then single layer Rosidal foam under 1 each 8,10 and 12 cm wide short stretch bandages.                    OT Education - 07/05/21 3810     Education Details Pt edu for multilayer , gradient compression wrap.    Person(s) Educated Patient    Methods Explanation;Demonstration;Handout  Comprehension Verbalized understanding;Returned demonstration;Need further instruction                 OT Long Term Goals - 05/28/21 1820       OT LONG TERM GOAL #1   Title Given this patients Intake score of 51/100 on the functional outcomes FOTO tool, patient will experience an increase in function of 5 points to improve basic and instrumental ADLs performance, including lymphedema self-care.    Baseline Max A    Time 12    Period Weeks    Status New      OT LONG TERM GOAL #2   Title Pt will be able to apply knee length, multi-layer, short stretch compression wraps to one limb at a time using gradient techniques with MAX CG ASSISTANCE to decrease limb volume, to limit infection risk, and to limit lymphedema progression.    Baseline Dependent    Time 4    Period Days    Status New      OT LONG TERM GOAL  #3   Title lymphedema self-care.  Pt will demonstrate understanding of lymphedema prevention strategies by identifying and discussing 5 precautions using printed reference (modified assistance) to reduce risk of progression and to limit infection risk.    Baseline Max A    Time 4    Period Days    Status New    Target Date --   4th OT Rx visit     OT LONG TERM GOAL #4   Title Pt will achieve at least a 10% limb volume reduction in bilateral legs to return limb to normal size and shape,  to limit lymphedema progression and to limit infection risk.    Baseline BLE comparative limb volumetrics TBA at 1st OT Rx visit. BY visual assessment both legs swollen by 10-15%, L>R    Time 12    Period Weeks    Status New    Target Date 08/23/21      OT LONG TERM GOAL #5   Title With MAX CG ASSISTANCE Pt will achieve and sustain a least 85% compliance with all 4 LE self-care home program components throughout Intensive Phase CDT, including modified simple self-MLD, daily skin inspection and care, lymphatic pumping the ex, 23/7 compression wraps to sustain clinical gains made in CDT and to limit lymphedema progression and further functional decline.    Baseline Dependent    Time 12    Period Weeks    Status New    Target Date 08/23/21                   Plan - 07/04/21 0814     Clinical Impression Statement Dustin Gray presents without wearing short stretch compression wraps but he brings a clean set to clinic for application. LLE dog scratches are healing, but Pt presents again today with lymphorrhea from these areas. Swelling is unchanged since initial evaluation. Dustin Gray is having difficulty wrapping at home and reports ~ 25% compliance with compression since last visit. His wife is unable to assist him at present. Consequently we devoted this visit  to teaching and practicing bandaging , including troubleshooting supportive positioning. By end of session Pt was able to apply 4 layer wrasp with mod  assist. We'll continue to work on this in clinic to improve response to Rx and to increase independence w/ LEE self care between sessions.    OT Occupational Profile and History Comprehensive Assessment- Review of records and extensive additional review of physical, cognitive,  psychosocial history related to current functional performance    Occupational performance deficits (Please refer to evaluation for details): ADL's;IADL's;Work;Leisure;Social Participation;Other   role performance   Body Structure / Function / Physical Skills ADL;Edema;Skin integrity;Flexibility;Pain;ROM;Decreased knowledge of use of DME;Scar mobility;IADL    Rehab Potential Good    Clinical Decision Making Several treatment options, min-mod task modification necessary    Comorbidities Affecting Occupational Performance: Presence of comorbidities impacting occupational performance    Modification or Assistance to Complete Evaluation  Min-Moderate modification of tasks or assist with assess necessary to complete eval    OT Frequency 2x / week    OT Duration 12 weeks   and PRN for follow along   OT Treatment/Interventions Self-care/ADL training;Therapeutic exercise;Manual Therapy;Coping strategies training;Therapeutic activities;Manual lymph drainage;DME and/or AE instruction;Compression bandaging;Other (comment);Patient/family education   skin care to limit infection risk   Plan Complete Decongestive Therapy (CDT) One leg at a time to limit fall LLE first. Manual lymphatic drainage (MLD), skin care, ther ex, compression wraps, then    OT Home Exercise Plan Pt verbalized understanding that he requires assistance with all lymphedema home care components, especially compression wrapping, between visits for optimal prognosis. Without assistance prognosis is poor    Recommended Other Services In insurance benefits available, garmentsConsider advanced sequential pneumatic compression device (Flexitouch) to maximize independence w  lymphedema self-care at home over time. Pt unable to perform simple self-mld    Consulted and Agree with Plan of Care Patient             Patient will benefit from skilled therapeutic intervention in order to improve the following deficits and impairments:   Body Structure / Function / Physical Skills: ADL, Edema, Skin integrity, Flexibility, Pain, ROM, Decreased knowledge of use of DME, Scar mobility, IADL       Visit Diagnosis: Lymphedema, not elsewhere classified    Problem List Patient Active Problem List   Diagnosis Date Noted   Persistent atrial fibrillation (HCC)    Graves disease    Abdominal bloating    Atrial fibrillation with RVR ([place]) 10/01/2019   Hyperthyroidism    Atrial fibrillation with rapid ventricular response ([place]) 09/30/2019   Leg edema    Left leg cellulitis    Special screening for malignant neoplasms, colon    Polyp of sigmoid colon    Benign neoplasm of ascending colon    Rectal polyp    Herniated nucleus pulposus, L5-S1 11/09/2015   Low back pain 10/25/2015   Herniated nucleus pulposus, C5-6 right 10/06/2015   Dysesthesia 09/16/2015   Spasticity 09/16/2015   Unsteady gait 09/16/2015   Multiple sclerosis ([place]) 06/08/2011   Andrey Spearman, MS, OTR/L, [facility] 07/05/21 8:23 AM   [place] MAIN [facility] SERVICES [place], [place], [address] Phone: [phone]   Fax:  [phone]  Name: Dustin Gray MRN: [id number] Date of Birth: Nov 13, 1955

## 2021-07-06 ENCOUNTER — Other Ambulatory Visit: Payer: Self-pay

## 2021-07-06 ENCOUNTER — Ambulatory Visit: Payer: Medicare PPO

## 2021-07-06 DIAGNOSIS — R278 Other lack of coordination: Secondary | ICD-10-CM | POA: Diagnosis not present

## 2021-07-06 DIAGNOSIS — R2681 Unsteadiness on feet: Secondary | ICD-10-CM

## 2021-07-06 DIAGNOSIS — R269 Unspecified abnormalities of gait and mobility: Secondary | ICD-10-CM

## 2021-07-06 DIAGNOSIS — M6281 Muscle weakness (generalized): Secondary | ICD-10-CM

## 2021-07-06 DIAGNOSIS — R2689 Other abnormalities of gait and mobility: Secondary | ICD-10-CM | POA: Diagnosis not present

## 2021-07-06 DIAGNOSIS — I890 Lymphedema, not elsewhere classified: Secondary | ICD-10-CM | POA: Diagnosis not present

## 2021-07-06 DIAGNOSIS — R262 Difficulty in walking, not elsewhere classified: Secondary | ICD-10-CM | POA: Diagnosis not present

## 2021-07-06 NOTE — Therapy (Signed)
[place] MAIN [facility] SERVICES [address] [place], [place], [address] Phone: [phone]   Fax:  [phone]  Physical Therapy Treatment/Physical Therapy Progress Note   Dates of reporting period  04/26/2021  to  07/06/2021  Patient Details  Name: Dustin Gray MRN: [id number] Date of Birth: July 07, 1955 No data recorded  Encounter Date: 07/06/2021   PT End of Session - 07/06/21 1447     Visit Number 30    Number of Visits 43    Date for PT Re-Evaluation 07/17/21    Authorization Type [employer]    Authorization Time Period 01/31/21-04/25/21    PT Start Time 1435    PT Stop Time 1516    PT Time Calculation (min) 41 min    Equipment Utilized During Treatment Gait belt    Activity Tolerance Patient tolerated treatment well;No increased pain;Patient limited by fatigue    Behavior During Therapy [facility] for tasks assessed/performed             Past Medical History:  Diagnosis Date   Abscess    groin   Arthritis    lower spine   Erectile dysfunction    Low testosterone    Lumbar herniated disc    L5   Multiple sclerosis (HCC)    Staph aureus infection     Past Surgical History:  Procedure Laterality Date   COLONOSCOPY WITH PROPOFOL N/A 07/04/2016   Procedure: COLONOSCOPY WITH PROPOFOL;  Surgeon: [name], MD;  Location: [place];  Service: Endoscopy;  Laterality: N/A;   [place]   POLYPECTOMY  07/04/2016   Procedure: POLYPECTOMY;  Surgeon: [name], MD;  Location: [place];  Service: Endoscopy;;   TONSILLECTOMY      There were no vitals filed for this visit.   Subjective Assessment - 07/06/21 1439     Subjective Patient reports no changes since last visit and excited to work out and see his progress today.    Pertinent History Patient presents to physical therapy for unsteadiness, walking instability, fatigue. His PMH includes paroxysmal a fib, MS, lymphedema,  hypothyroidism, Graves Disease, arthritis, lumbar herniated disc (L5), depression and neuropathy. Patient first developed symptoms of MS in 2005 and was diagnosed in 2006. Patient has done PT in the past, but hasn't been seen since 2020 at [facility]. Has not been doing his exercises in the past year. Walk outside to garage to ride lawnmower, walks in house. 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 a couple hours    How long can you stand comfortably? no more than 10 minutes before pain, have to steady self immediately    How long can you walk comfortably? has to use rollator. can walk in a store with a cart    Patient Stated Goals want to get stronger and feel mor comfortable walking around. Patient would like to continue strengthening hips and improve endurance and ability to pace self for safety while walking.    Currently in Pain? Yes    Pain Score 3     Pain Location Knee    Pain Orientation Right;Left;Anterior    Pain Descriptors / Indicators Aching;Dull    Pain Type Chronic pain    Pain Onset More than a month ago    Pain Frequency Intermittent  Aggravating Factors  Prolonged sitting/standing/walking    Pain Relieving Factors none    Multiple Pain Sites No             INTERVENTIONS:    Reassessed Long term goals:   6 min walk test- 900 feet with use of bariatric walker  10 MWT= 14 sec = 0.71 (improved from 0.68 m/s)  Glute med strength= 3-/5 (same as last assessment) - Issued the following to assist with achieving goal:  Clamshell- x 10 active and another 10 reps AAROM Supine- Hip ER- 2 sets of 10 reps. Patient able to perform actively without any assist. Bridging-X 10 reps with VC to hold and return to supine slowly for better muscle control and strength.   Education provided throughout session via VC/TC and demonstration to  facilitate movement at target joints and correct muscle activation for all testing and exercises performed.                       PT Education - 07/06/21 1440     Education Details Exercise technique    Person(s) Educated Patient    Methods Explanation;Demonstration;Verbal cues;Tactile cues    Comprehension Verbalized understanding;Returned demonstration;Verbal cues required;Tactile cues required;Need further instruction              PT Short Term Goals - 04/24/21 1432       PT SHORT TERM GOAL #1   Title Patient will be independent in home exercise program to improve strength/mobility for better functional independence with ADLs.    Baseline 8/9: HEP to be given next session, 10/31: patient reports compliance with HEP and would like progressions added next session.    Time 4    Period Weeks    Status Partially Met    Target Date 05/22/21               PT Long Term Goals - 07/06/21 1501       PT LONG TERM GOAL #1   Title Patient will increase FOTO score to equal to or greater than 70%    to demonstrate statistically significant improvement in mobility and quality of life.    Baseline 8/9: 64%; risk adjusted 34%; 03/21/21 FOTO: 45; 04/24/21 FOTO: 49%    Time 12    Period Weeks    Status Partially Met    Target Date 07/17/21      PT LONG TERM GOAL #2   Title Patient will increase Berg Balance score by > 6 points to demonstrate decreased fall risk during functional activities.    Baseline 8/9: BBT 29/56; 03/21/21: 42/56    Time 12    Period Weeks    Status Achieved      PT LONG TERM GOAL #3   Title Patient will increase 10 meter walk test to >1.74ms as to improve gait speed for better community ambulation and to reduce fall risk.    Baseline 8/9: 0.65 m/s with rollator; 10/31: 0.68 m/s with rollator. 07/06/2021= 0.71 m/s using rollator    Time 12    Period Weeks    Status On-going    Target Date 07/17/21      PT LONG TERM GOAL #4   Title Patient  will increase six minute walk test distance to >1000 for progression to community ambulator and improve gait ability    Baseline 8/9: 505 ft with rollator; 03/21/21: 7764fc rollator, 04/24/21: 7554f rollator; 07/06/2021=900 feet    Time 12    Period  Weeks    Status On-going    Target Date 07/17/21      PT LONG TERM GOAL #5   Title Patient will increase glute medius strength on Left LE from 3-/5 to 4/5 to improve stability, gait mechanics, and functional strength.    Baseline 10/31: 3-/5; 07/06/2021= 3-/5    Time 12    Period Weeks    Status On-going    Target Date 07/17/21      PT LONG TERM GOAL #6   Title Patient will increase Left single leg stance time to 15 seconds or greater to increase safety in shower and independence with ADLs.    Baseline 10/31; 1.83 sec without UE support    Time 12    Period Weeks    Status New    Target Date 07/17/21                   Plan - 07/06/21 1441     Clinical Impression Statement Patient presents with good motivation for all retesting and activities today. He continues to present with leg wrapped yet report of any increased pain. He was able to demonstrate progress including much improved overall gait velocity and 6 min walk test indicating a decreased risk of falling. Patient's condition has the potential to improve in response to therapy. Maximum improvement is yet to be obtained. The anticipated improvement is attainable and reasonable in a generally predictable time. Patient will continue to benefit from skilled physical therapy intervention in order to improve his strength, ambulatory capacity, balance, and increase his overall function and safety with everyday ambulation    Personal Factors and Comorbidities Age;Comorbidity 3+;Fitness;Past/Current Experience;Time since onset of injury/illness/exacerbation    Comorbidities aroxysmal a fib, MS, lymphedema, hypothyroidism, Graves Disease, arthritis, lumbar herniated disc (L5), depression  and neuropathy    Examination-Activity Limitations Bed Mobility;Bend;Caring for Others;Carry;Reach Overhead;Locomotion Level;Lift;Hygiene/Grooming;Dressing;Squat;Stairs;Stand;Toileting;Transfers    Examination-Participation Restrictions Cleaning;Community Activity;Laundry;Occupation;Shop;Volunteer;Yard Work    [employer]chant [employer] officer Evolving/Moderate complexity    Rehab Potential Fair    PT Frequency 2x / week    PT Duration 12 weeks    PT Treatment/Interventions ADLs/Self Care Home Management;Aquatic Therapy;Canalith Repostioning;Biofeedback;Cryotherapy;Ultrasound;Traction;Moist Heat;Electrical Stimulation;DME Instruction;Gait training;Therapeutic exercise;Therapeutic activities;Functional mobility training;Stair training;Balance training;Neuromuscular re-education;Patient/family education;Manual techniques;Orthotic Fit/Training;Compression bandaging;Passive range of motion;Vestibular;Taping;Splinting;Energy conservation;Dry needling;Visual/perceptual remediation/compensation    PT Home Exercise Plan Access Code: [email or web address] URL: [email or web address]  Added Bridges with BTB and encouraged more walking around the home during commerical breaks when watching TV.    Consulted and Agree with Plan of Care Patient             Patient will benefit from skilled therapeutic intervention in order to improve the following deficits and impairments:  Abnormal gait, Cardiopulmonary status limiting activity, Decreased activity tolerance, Decreased coordination, Decreased balance, Decreased endurance, Decreased mobility, Decreased strength, Decreased range of motion, Difficulty walking, Impaired flexibility, Impaired sensation, Postural dysfunction, Improper body mechanics  Visit Diagnosis: Abnormality of gait and mobility  Difficulty in walking, not elsewhere classified  Muscle weakness (generalized)  Unsteadiness on feet     Problem List Patient Active Problem List    Diagnosis Date Noted   Persistent atrial fibrillation (HCC)    Graves disease    Abdominal bloating    Atrial fibrillation with RVR ([place]) 10/01/2019   Hyperthyroidism    Atrial fibrillation with rapid ventricular response ([place]) 09/30/2019   Leg edema    Left leg cellulitis    Special screening for malignant neoplasms, colon    Polyp of sigmoid colon  Benign neoplasm of ascending colon    Rectal polyp    Herniated nucleus pulposus, L5-S1 11/09/2015   Low back pain 10/25/2015   Herniated nucleus pulposus, C5-6 right 10/06/2015   Dysesthesia 09/16/2015   Spasticity 09/16/2015   Unsteady gait 09/16/2015   Multiple sclerosis ([place]) 06/08/2011    Lewis Moccasin, PT 07/07/2021, 3:05 PM  [place] MAIN [facility] SERVICES [address] [place], [place], [address] Phone: [phone]   Fax:  [phone]  Name: Dustin Gray MRN: [id number] Date of Birth: 1956-03-26

## 2021-07-10 ENCOUNTER — Ambulatory Visit: Payer: Medicare PPO

## 2021-07-10 ENCOUNTER — Ambulatory Visit: Payer: Medicare PPO | Admitting: Occupational Therapy

## 2021-07-10 ENCOUNTER — Other Ambulatory Visit: Payer: Self-pay

## 2021-07-10 DIAGNOSIS — R262 Difficulty in walking, not elsewhere classified: Secondary | ICD-10-CM

## 2021-07-10 DIAGNOSIS — R2681 Unsteadiness on feet: Secondary | ICD-10-CM

## 2021-07-10 DIAGNOSIS — R269 Unspecified abnormalities of gait and mobility: Secondary | ICD-10-CM | POA: Diagnosis not present

## 2021-07-10 DIAGNOSIS — R278 Other lack of coordination: Secondary | ICD-10-CM | POA: Diagnosis not present

## 2021-07-10 DIAGNOSIS — M6281 Muscle weakness (generalized): Secondary | ICD-10-CM

## 2021-07-10 DIAGNOSIS — I890 Lymphedema, not elsewhere classified: Secondary | ICD-10-CM | POA: Diagnosis not present

## 2021-07-10 DIAGNOSIS — R2689 Other abnormalities of gait and mobility: Secondary | ICD-10-CM | POA: Diagnosis not present

## 2021-07-10 NOTE — Therapy (Signed)
[place] MAIN [facility] SERVICES [address] [place], [place], [address] Phone: [phone]   Fax:  [phone]  Physical Therapy Treatment  Patient Details  Name: Dustin Gray MRN: [id number] Date of Birth: Jul 01, 1955 No data recorded  Encounter Date: 07/10/2021   PT End of Session - 07/10/21 1614     Visit Number 31    Number of Visits 43    Date for PT Re-Evaluation 07/17/21    Authorization Type [employer]    Authorization Time Period 01/31/21-04/25/21    PT Start Time 1607    PT Stop Time 1645    PT Time Calculation (min) 38 min    Equipment Utilized During Treatment Gait belt    Activity Tolerance Patient tolerated treatment well;No increased pain;Patient limited by fatigue    Behavior During Therapy [facility] for tasks assessed/performed             Past Medical History:  Diagnosis Date   Abscess    groin   Arthritis    lower spine   Erectile dysfunction    Low testosterone    Lumbar herniated disc    L5   Multiple sclerosis (HCC)    Staph aureus infection     Past Surgical History:  Procedure Laterality Date   COLONOSCOPY WITH PROPOFOL N/A 07/04/2016   Procedure: COLONOSCOPY WITH PROPOFOL;  Surgeon: [name], MD;  Location: [place];  Service: Endoscopy;  Laterality: N/A;   [place]   POLYPECTOMY  07/04/2016   Procedure: POLYPECTOMY;  Surgeon: [name], MD;  Location: [place];  Service: Endoscopy;;   TONSILLECTOMY      There were no vitals filed for this visit.   Subjective Assessment - 07/10/21 1613     Subjective Patient reports he elevated his legs most of the weekend. No falls or LOB since last session.    Pertinent History Patient presents to physical therapy for unsteadiness, walking instability, fatigue. His PMH includes paroxysmal a fib, MS, lymphedema, hypothyroidism, Graves Disease, arthritis, lumbar herniated disc (L5), depression and  neuropathy. Patient first developed symptoms of MS in 2005 and was diagnosed in 2006. Patient has done PT in the past, but hasn't been seen since 2020 at [facility]. Has not been doing his exercises in the past year. Walk outside to garage to ride lawnmower, walks in house. 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 a couple hours    How long can you stand comfortably? no more than 10 minutes before pain, have to steady self immediately    How long can you walk comfortably? has to use rollator. can walk in a store with a cart    Patient Stated Goals want to get stronger and feel mor comfortable walking around. Patient would like to continue strengthening hips and improve endurance and ability to pace self for safety while walking.    Pain Score 2     Pain Location Knee    Pain Orientation Right;Left    Pain Descriptors / Indicators Aching    Pain Type Chronic pain    Pain Onset More than a month ago               Nustep Lvl 4 RPM> 60 seat position 12; 4 minutes for cardiovascular and musculoskeletal challenge.  In // bars:  airex balance beam: lateral stepping 6x length of // bars; cues to not let LLE hyperextend   Airex balance beam: tandem walking 4x length with UE support Airex balance beam weighted ball chest press 10x; overhead press 10x  Step over orange hurdle; very challenging LLE 10x each LE; BUE support   10x STS from standard height chair   Side stepping x4 lengths of // bars with BUE support, cuing for pressing into ground with stance leg to promote active hip abduction/ pelvic stability   Car transfer education and performance with CGA  Pt educated throughout session about proper posture and technique with exercises. Improved exercise technique, movement at target joints, use of target muscles after min to mod verbal, visual,  tactile cues.   Patient is highly motivated throughout physical therapy session. He tolerates strengthening and stability interventions well. He continues to require cueing for reduction of LLE hyperextension. Airex pad however he does expend a lot of energy with this task and requires rest breaks and increased frequency when performing dynamic lower extremity balance tasks. Patient will continue to benefit from skilled physical therapy intervention in order to improve his strength, ambulatory capacity, balance, and increase his overall function and safety with everyday ambulation.                     PT Education - 07/10/21 1613     Education Details exercise technique, body mechanics    Person(s) Educated Patient    Methods Explanation;Demonstration;Tactile cues;Verbal cues    Comprehension Verbalized understanding;Returned demonstration;Verbal cues required;Tactile cues required              PT Short Term Goals - 04/24/21 1432       PT SHORT TERM GOAL #1   Title Patient will be independent in home exercise program to improve strength/mobility for better functional independence with ADLs.    Baseline 8/9: HEP to be given next session, 10/31: patient reports compliance with HEP and would like progressions added next session.    Time 4    Period Weeks    Status Partially Met    Target Date 05/22/21               PT Long Term Goals - 07/06/21 1501       PT LONG TERM GOAL #1   Title Patient will increase FOTO score to equal to or greater than 70%    to demonstrate statistically significant improvement in mobility and quality of life.    Baseline 8/9: 64%; risk adjusted 34%; 03/21/21 FOTO: 45; 04/24/21 FOTO: 49%    Time 12    Period Weeks    Status Partially Met    Target Date 07/17/21      PT LONG TERM GOAL #2   Title Patient will increase Berg Balance score by > 6 points to demonstrate decreased fall risk during functional activities.    Baseline 8/9:  BBT 29/56; 03/21/21: 42/56    Time 12    Period Weeks    Status Achieved      PT LONG TERM GOAL #3   Title Patient will increase 10 meter walk test to >1.46ms as to improve gait speed for better community ambulation and to reduce fall risk.    Baseline 8/9: 0.65 m/s with rollator; 10/31: 0.68 m/s with rollator. 07/06/2021= 0.71 m/s using rollator    Time 12    Period Weeks    Status On-going    Target Date  07/17/21      PT LONG TERM GOAL #4   Title Patient will increase six minute walk test distance to >1000 for progression to community ambulator and improve gait ability    Baseline 8/9: 505 ft with rollator; 03/21/21: 783f c rollator, 04/24/21: 751fc rollator; 07/06/2021=900 feet    Time 12    Period Weeks    Status On-going    Target Date 07/17/21      PT LONG TERM GOAL #5   Title Patient will increase glute medius strength on Left LE from 3-/5 to 4/5 to improve stability, gait mechanics, and functional strength.    Baseline 10/31: 3-/5; 07/06/2021= 3-/5    Time 12    Period Weeks    Status On-going    Target Date 07/17/21      PT LONG TERM GOAL #6   Title Patient will increase Left single leg stance time to 15 seconds or greater to increase safety in shower and independence with ADLs.    Baseline 10/31; 1.83 sec without UE support    Time 12    Period Weeks    Status New    Target Date 07/17/21                   Plan - 07/10/21 1637     Clinical Impression Statement Patient is highly motivated throughout physical therapy session. He tolerates strengthening and stability interventions well. He continues to require cueing for reduction of LLE hyperextension. Airex pad however he does expend a lot of energy with this task and requires rest breaks and increased frequency when performing dynamic lower extremity balance tasks. Patient will continue to benefit from skilled physical therapy intervention in order to improve his strength, ambulatory capacity, balance, and  increase his overall function and safety with everyday ambulation.    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    Rehab Potential Fair    PT Frequency 2x / week    PT Duration 12 weeks    PT Treatment/Interventions ADLs/Self Care Home Management;Aquatic Therapy;Canalith Repostioning;Biofeedback;Cryotherapy;Ultrasound;Traction;Moist Heat;Electrical Stimulation;DME Instruction;Gait training;Therapeutic exercise;Therapeutic activities;Functional mobility training;Stair training;Balance training;Neuromuscular re-education;Patient/family education;Manual techniques;Orthotic Fit/Training;Compression bandaging;Passive range of motion;Vestibular;Taping;Splinting;Energy conservation;Dry needling;Visual/perceptual remediation/compensation    PT Home Exercise Plan Access Code: [email or web address]: [email or web address]  Added Bridges with BTB and encouraged more walking around the home during commerical breaks when watching TV.    Consulted and Agree with Plan of Care Patient             Patient will benefit from skilled therapeutic intervention in order to improve the following deficits and impairments:  Abnormal gait, Cardiopulmonary status limiting activity, Decreased activity tolerance, Decreased coordination, Decreased balance, Decreased endurance, Decreased mobility, Decreased strength, Decreased range of  motion, Difficulty walking, Impaired flexibility, Impaired sensation, Postural dysfunction, Improper body mechanics  Visit Diagnosis: Abnormality of gait and mobility  Difficulty in walking, not elsewhere classified  Muscle weakness (generalized)  Unsteadiness on feet     Problem List Patient Active Problem List   Diagnosis Date Noted   Persistent atrial fibrillation (HCC)    Graves disease    Abdominal bloating    [place]   Hyperthyroidism  Atrial fibrillation with rapid ventricular response ([place]) 09/30/2019   Leg edema    Left leg cellulitis    Special screening for malignant neoplasms, colon    Polyp of sigmoid colon    Benign neoplasm of ascending colon    Rectal polyp    Herniated nucleus pulposus, L5-S1 11/09/2015   Low back pain 10/25/2015   Herniated nucleus pulposus, C5-6 right 10/06/2015   Dysesthesia 09/16/2015   Spasticity 09/16/2015   Unsteady gait 09/16/2015   Multiple sclerosis ([place]) 06/08/2011    Janna Arch, PT, DPT  07/11/2021, 7:06 AM  [place] MAIN [facility] SERVICES [address] [place], [place], [address] Phone: [phone]   Fax:  [phone]  Name: ARTUR WINNINGHAM MRN: [id number] Date of Birth: 1956/02/03

## 2021-07-12 ENCOUNTER — Ambulatory Visit: Payer: Medicare PPO | Admitting: Occupational Therapy

## 2021-07-12 ENCOUNTER — Ambulatory Visit: Payer: Medicare PPO

## 2021-07-12 ENCOUNTER — Other Ambulatory Visit: Payer: Self-pay

## 2021-07-12 DIAGNOSIS — R269 Unspecified abnormalities of gait and mobility: Secondary | ICD-10-CM | POA: Diagnosis not present

## 2021-07-12 DIAGNOSIS — M6281 Muscle weakness (generalized): Secondary | ICD-10-CM

## 2021-07-12 DIAGNOSIS — I890 Lymphedema, not elsewhere classified: Secondary | ICD-10-CM

## 2021-07-12 DIAGNOSIS — R278 Other lack of coordination: Secondary | ICD-10-CM | POA: Diagnosis not present

## 2021-07-12 DIAGNOSIS — R2681 Unsteadiness on feet: Secondary | ICD-10-CM | POA: Diagnosis not present

## 2021-07-12 DIAGNOSIS — R262 Difficulty in walking, not elsewhere classified: Secondary | ICD-10-CM | POA: Diagnosis not present

## 2021-07-12 DIAGNOSIS — R2689 Other abnormalities of gait and mobility: Secondary | ICD-10-CM | POA: Diagnosis not present

## 2021-07-12 NOTE — Therapy (Signed)
[place] MAIN [facility] SERVICES [address] [place], [place], [address] Phone: [phone]   Fax:  [phone]  Physical Therapy Treatment  Patient Details  Name: Dustin Gray MRN: [id number] Date of Birth: 1955-09-04 No data recorded  Encounter Date: 07/12/2021   PT End of Session - 07/12/21 1308     Visit Number 32    Number of Visits 43    Date for PT Re-Evaluation 07/17/21    Authorization Type [employer]    Authorization Time Period 01/31/21-04/25/21    PT Start Time 1302    PT Stop Time 1344    PT Time Calculation (min) 42 min    Equipment Utilized During Treatment Gait belt    Activity Tolerance Patient tolerated treatment well;No increased pain;Patient limited by fatigue    Behavior During Therapy [facility] for tasks assessed/performed             Past Medical History:  Diagnosis Date   Abscess    groin   Arthritis    lower spine   Erectile dysfunction    Low testosterone    Lumbar herniated disc    L5   Multiple sclerosis (HCC)    Staph aureus infection     Past Surgical History:  Procedure Laterality Date   COLONOSCOPY WITH PROPOFOL N/A 07/04/2016   Procedure: COLONOSCOPY WITH PROPOFOL;  Surgeon: [name], MD;  Location: [place];  Service: Endoscopy;  Laterality: N/A;   [place]   POLYPECTOMY  07/04/2016   Procedure: POLYPECTOMY;  Surgeon: [name], MD;  Location: [place];  Service: Endoscopy;;   TONSILLECTOMY      There were no vitals filed for this visit.   Subjective Assessment - 07/12/21 1307     Subjective Patient reports no aches or pains out of the normal. No falls since last session, has lymphedema afterwards.    Pertinent History Patient presents to physical therapy for unsteadiness, walking instability, fatigue. His PMH includes paroxysmal a fib, MS, lymphedema, hypothyroidism, Graves Disease, arthritis, lumbar herniated disc (L5),  depression and neuropathy. Patient first developed symptoms of MS in 2005 and was diagnosed in 2006. Patient has done PT in the past, but hasn't been seen since 2020 at [facility]. Has not been doing his exercises in the past year. Walk outside to garage to ride lawnmower, walks in house. 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 a couple hours    How long can you stand comfortably? no more than 10 minutes before pain, have to steady self immediately    How long can you walk comfortably? has to use rollator. can walk in a store with a cart    Patient Stated Goals want to get stronger and feel mor comfortable walking around. Patient would like to continue strengthening hips and improve endurance and ability to pace self for safety while walking.    Currently in Pain? No/denies                  By support bar: Neuro re-ed airex pad: horizontal and vertical scans with reaches outside BOS x 6 minutes with word game for dual task Airex pad 6" step: modified tandem horizontal and vertical scans with reaches outside BOS x 6 minutes with word game for dual task  4 steps  backwards x 8 trials without UE support  SLS each LE 30 seconds with BUE support  TherEx  10x STS from standard height chair  6" step: alternating toe taps BUE support, cue to reduce Left knee hyperextension 15x each LE 6" step lateral toe tap 12x each LE  15x heel raises with BUE support Adduction ball squeeze 10x 3 second holds  Modified LAQ LLE for quad activation 6x each LE   Pt educated throughout session about proper posture and technique with exercises. Improved exercise technique, movement at target joints, use of target muscles after min to mod verbal, visual, tactile cues.    Patient tolerates increased weight acceptance onto affected LLE this session with decreased  episodic hyperextension. Patient challenged with prolonged muscle recruitment due to fatigue. Patient is improving with foot clearance on step for toe taps. Lateral stepping is harder than anterior movement. Patient will continue to benefit from skilled physical therapy intervention in order to improve his strength, ambulatory capacity, balance, and increase his overall function and safety with everyday ambulation.                  PT Education - 07/12/21 1308     Education Details exercise technique, body mechanics    Person(s) Educated Patient    Methods Explanation;Demonstration;Tactile cues;Verbal cues    Comprehension Verbalized understanding;Verbal cues required;Returned demonstration;Tactile cues required              PT Short Term Goals - 04/24/21 1432       PT SHORT TERM GOAL #1   Title Patient will be independent in home exercise program to improve strength/mobility for better functional independence with ADLs.    Baseline 8/9: HEP to be given next session, 10/31: patient reports compliance with HEP and would like progressions added next session.    Time 4    Period Weeks    Status Partially Met    Target Date 05/22/21               PT Long Term Goals - 07/06/21 1501       PT LONG TERM GOAL #1   Title Patient will increase FOTO score to equal to or greater than 70%    to demonstrate statistically significant improvement in mobility and quality of life.    Baseline 8/9: 64%; risk adjusted 34%; 03/21/21 FOTO: 45; 04/24/21 FOTO: 49%    Time 12    Period Weeks    Status Partially Met    Target Date 07/17/21      PT LONG TERM GOAL #2   Title Patient will increase Berg Balance score by > 6 points to demonstrate decreased fall risk during functional activities.    Baseline 8/9: BBT 29/56; 03/21/21: 42/56    Time 12    Period Weeks    Status Achieved      PT LONG TERM GOAL #3   Title Patient will increase 10 meter walk test to >1.3ms as to improve  gait speed for better community ambulation and to reduce fall risk.    Baseline 8/9: 0.65 m/s with rollator; 10/31: 0.68 m/s with rollator. 07/06/2021= 0.71 m/s using rollator    Time 12    Period Weeks    Status On-going    Target Date 07/17/21      PT LONG TERM GOAL #4   Title Patient will increase six minute walk test distance to >1000 for progression to community ambulator and improve gait ability    Baseline 8/9:  505 ft with rollator; 03/21/21: 717f c rollator, 04/24/21: 756fc rollator; 07/06/2021=900 feet    Time 12    Period Weeks    Status On-going    Target Date 07/17/21      PT LONG TERM GOAL #5   Title Patient will increase glute medius strength on Left LE from 3-/5 to 4/5 to improve stability, gait mechanics, and functional strength.    Baseline 10/31: 3-/5; 07/06/2021= 3-/5    Time 12    Period Weeks    Status On-going    Target Date 07/17/21      PT LONG TERM GOAL #6   Title Patient will increase Left single leg stance time to 15 seconds or greater to increase safety in shower and independence with ADLs.    Baseline 10/31; 1.83 sec without UE support    Time 12    Period Weeks    Status New    Target Date 07/17/21                   Plan - 07/12/21 1330     Clinical Impression Statement Patient tolerates increased weight acceptance onto affected LLE this session with decreased episodic hyperextension. Patient challenged with prolonged muscle recruitment due to fatigue. Patient is improving with foot clearance on step for toe taps. Lateral stepping is harder than anterior movement. Patient will continue to benefit from skilled physical therapy intervention in order to improve his strength, ambulatory capacity, balance, and increase his overall function and safety with everyday ambulation.    Personal Factors and Comorbidities Age;Comorbidity 3+;Fitness;Past/Current Experience;Time since onset of injury/illness/exacerbation    Comorbidities aroxysmal a fib, MS,  lymphedema, hypothyroidism, Graves Disease, arthritis, lumbar herniated disc (L5), depression and neuropathy    Examination-Activity Limitations Bed Mobility;Bend;Caring for Others;Carry;Reach Overhead;Locomotion Level;Lift;Hygiene/Grooming;Dressing;Squat;Stairs;Stand;Toileting;Transfers    Examination-Participation Restrictions Cleaning;Community Activity;Laundry;Occupation;Shop;Volunteer;Yard Work    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    Rehab Potential Fair    PT Frequency 2x / week    PT Duration 12 weeks    PT Treatment/Interventions ADLs/Self Care Home Management;Aquatic Therapy;Canalith Repostioning;Biofeedback;Cryotherapy;Ultrasound;Traction;Moist Heat;Electrical Stimulation;DME Instruction;Gait training;Therapeutic exercise;Therapeutic activities;Functional mobility training;Stair training;Balance training;Neuromuscular re-education;Patient/family education;Manual techniques;Orthotic Fit/Training;Compression bandaging;Passive range of motion;Vestibular;Taping;Splinting;Energy conservation;Dry needling;Visual/perceptual remediation/compensation    PT Home Exercise Plan Access Code: [email or web address]: [email or web address]  Added [place]ges with BTB and encouraged more walking around the home during commerical breaks when watching TV.    Consulted and Agree with Plan of Care Patient             Patient will benefit from skilled therapeutic intervention in order to improve the following deficits and impairments:  Abnormal gait, Cardiopulmonary status limiting activity, Decreased activity tolerance, Decreased coordination, Decreased balance, Decreased endurance, Decreased mobility, Decreased strength, Decreased range of motion, Difficulty walking, Impaired flexibility, Impaired sensation, Postural dysfunction, Improper body mechanics  Visit Diagnosis: Abnormality of gait and mobility  Difficulty in walking, not elsewhere classified  Muscle weakness  (generalized)  Unsteadiness on feet     Problem List Patient Active Problem List   Diagnosis Date Noted   Persistent atrial fibrillation (HCC)    Graves disease    Abdominal bloating    At[place] with [place]VR ([place]/01[place]2020   Hyperthyroidism    Atrial fibrillation with rapid ventricular response ([place]04/12/2019   Leg edema    Left leg cellulitis    Special screening for malignant neoplasms, colon    Polyp of sigmoid colon    Benign neoplasm of ascending colon    Rectal polyp  Herniated nucleus pulposus, L5-S1 11/09/2015   Low back pain 10/25/2015   Herniated nucleus pulposus, C5-6 right 10/06/2015   Dysesthesia 09/16/2015   Spasticity 09/16/2015   Unsteady gait 09/16/2015   Multiple sclerosis ([place]) 06/08/2011    Janna Arch, PT, DPT  07/12/2021, 1:46 PM  [place] MAIN [facility] SERVICES [address] [place], [place], [address] Phone: [phone]   Fax:  [phone]  Name: Dustin Gray MRN: [id number] Date of Birth: 1955/11/05

## 2021-07-13 NOTE — Patient Instructions (Signed)

## 2021-07-13 NOTE — Therapy (Signed)
[place] MAIN [facility] SERVICES [address] [place], [place], [address] Phone: [phone]   Fax:  [phone]  Occupational Therapy Treatment  Patient Details  Name: Dustin Gray MRN: [id number] Date of Birth: 1956-06-02 Referring Provider (OT): Staci Acosta, MD   Encounter Date: 07/12/2021   OT End of Session - 07/12/21 1414     Visit Number 5    Number of Visits 36    Date for OT Re-Evaluation 08/23/21    OT Start Time 0205    OT Stop Time 0305    OT Time Calculation (min) 60 min    Activity Tolerance Patient tolerated treatment well;No increased pain    Behavior During Therapy WFL for tasks assessed/performed             Past Medical History:  Diagnosis Date   Abscess    groin   Arthritis    lower spine   Erectile dysfunction    Low testosterone    Lumbar herniated disc    L5   Multiple sclerosis (HCC)    Staph aureus infection     Past Surgical History:  Procedure Laterality Date   COLONOSCOPY WITH PROPOFOL N/A 07/04/2016   Procedure: COLONOSCOPY WITH PROPOFOL;  Surgeon: [name], MD;  Location: [place];  Service: Endoscopy;  Laterality: N/A;   [place]   POLYPECTOMY  07/04/2016   Procedure: POLYPECTOMY;  Surgeon: [name], MD;  Location: [place];  Service: Endoscopy;;   TONSILLECTOMY      There were no vitals filed for this visit.   Subjective Assessment - 07/12/21 1415     Subjective  Dustin Gray presents for OT visit 5/36 to address BLE lymphedema.  Pt reports 3/10 heaviness and fullness in BLE today. Pt reports he wrapped himself all weekend. He tells me he will look for the one set of misplaced bandages.    Pertinent History relevant to LE: HTN, MS, Herniated C-5-C6, L5-S1, chronic low back pain, persistent Afib,Hx LLE cellulitis, Graves disease, HYPERthyroidism, OA    Limitations difficulty walking, impaired functional mobility and  transfers, impaired balance, muscle weakness, L>R,, altered sensation, chronic back pain , chronic leg swelling and associated pain, spasticity in legs    Repetition Increases Symptoms    Special Tests +Stemmer sign, L>R; Intake FOTO: 51/100    Patient Stated Goals Learn about lymphedema and explore treatment options    Pain Onset More than a month ago    Pain Onset More than a month ago                                  OT Education - 07/13/21 0901     Education Details Continued Pt/ CG edu for lymphedema self care  and home program throughout session. Topics include multilayer, gradient compression wrapping, simple self-MLD, therapeutic lymphatic pumping exercises, skin/nail care, risk reduction factors and LE precautions, compression garments/recommendations and wear and care schedule and compression garment donning / doffing using assistive devices. All questions answered to the Pt's satisfaction, and Pt demonstrates understanding by report.    Person(s) Educated Patient    Methods Explanation;Demonstration;Handout    Comprehension Verbalized understanding;Returned demonstration;Need further instruction                 OT Long Term Goals - 05/28/21 1820       OT LONG  TERM GOAL #1   Title Given this patients Intake score of 51/100 on the functional outcomes FOTO tool, patient will experience an increase in function of 5 points to improve basic and instrumental ADLs performance, including lymphedema self-care.    Baseline Max A    Time 12    Period Weeks    Status New      OT LONG TERM GOAL #2   Title Pt will be able to apply knee length, multi-layer, short stretch compression wraps to one limb at a time using gradient techniques with MAX CG ASSISTANCE to decrease limb volume, to limit infection risk, and to limit lymphedema progression.    Baseline Dependent    Time 4    Period Days    Status New      OT LONG TERM GOAL #3   Title lymphedema  self-care.  Pt will demonstrate understanding of lymphedema prevention strategies by identifying and discussing 5 precautions using printed reference (modified assistance) to reduce risk of progression and to limit infection risk.    Baseline Max A    Time 4    Period Days    Status New    Target Date --   4th OT Rx visit     OT LONG TERM GOAL #4   Title Pt will achieve at least a 10% limb volume reduction in bilateral legs to return limb to normal size and shape,  to limit lymphedema progression and to limit infection risk.    Baseline BLE comparative limb volumetrics TBA at 1st OT Rx visit. BY visual assessment both legs swollen by 10-15%, L>R    Time 12    Period Weeks    Status New    Target Date 08/23/21      OT LONG TERM GOAL #5   Title With MAX CG ASSISTANCE Pt will achieve and sustain a least 85% compliance with all 4 LE self-care home program components throughout Intensive Phase CDT, including modified simple self-MLD, daily skin inspection and care, lymphatic pumping the ex, 23/7 compression wraps to sustain clinical gains made in CDT and to limit lymphedema progression and further functional decline.    Baseline Dependent    Time 12    Period Weeks    Status New    Target Date 08/23/21                   Plan - 07/13/21 0859     Clinical Impression Statement Pt self-applied compression wraps to LLE over the weekend and did a nice job. LLE below the knee is moderately decongested compared with last visit and animal scratches on his legs are scabbed over and healing well. EWre continued LLE MLD, skin care and reapplied wraps at end of session. Cont as per [facility].    OT Occupational Profile and History Comprehensive Assessment- Review of records and extensive additional review of physical, cognitive, psychosocial history related to current functional performance    Occupational performance deficits (Please refer to evaluation for details): ADL's;IADL's;Work;Leisure;Social  Participation;Other   role performance   Body Structure / Function / Physical Skills ADL;Edema;Skin integrity;Flexibility;Pain;ROM;Decreased knowledge of use of DME;Scar mobility;IADL    Rehab Potential Good    Clinical Decision Making Several treatment options, min-mod task modification necessary    Comorbidities Affecting Occupational Performance: Presence of comorbidities impacting occupational performance    Modification or Assistance to Complete Evaluation  Min-Moderate modification of tasks or assist with assess necessary to complete eval    OT Frequency 2x /  week    OT Duration 12 weeks   and PRN for follow along   OT Treatment/Interventions Self-care/ADL training;Therapeutic exercise;Manual Therapy;Coping strategies training;Therapeutic activities;Manual lymph drainage;DME and/or AE instruction;Compression bandaging;Other (comment);Patient/family education   skin care to limit infection risk   Plan Complete Decongestive Therapy (CDT) One leg at a time to limit fall LLE first. Manual lymphatic drainage (MLD), skin care, ther ex, compression wraps, then    OT Home Exercise Plan Pt verbalized understanding that he requires assistance with all lymphedema home care components, especially compression wrapping, between visits for optimal prognosis. Without assistance prognosis is poor    Recommended Other Services In insurance benefits available, garmentsConsider advanced sequential pneumatic compression device (Flexitouch) to maximize independence w lymphedema self-care at home over time. Pt unable to perform simple self-mld    Consulted and Agree with Plan of Care Patient             Patient will benefit from skilled therapeutic intervention in order to improve the following deficits and impairments:   Body Structure / Function / Physical Skills: ADL, Edema, Skin integrity, Flexibility, Pain, ROM, Decreased knowledge of use of DME, Scar mobility, IADL       Visit Diagnosis: Lymphedema,  not elsewhere classified    Problem List Patient Active Problem List   Diagnosis Date Noted   Persistent atrial fibrillation (HCC)    Graves disease    Abdominal bloating    Atrial fibrillation with RVR ([place]) 10/01/2019   Hyperthyroidism    Atrial fibrillation with rapid ventricular response ([place]) 09/30/2019   Leg edema    Left leg cellulitis    Special screening for malignant neoplasms, colon    Polyp of sigmoid colon    Benign neoplasm of ascending colon    Rectal polyp    Herniated nucleus pulposus, L5-S1 11/09/2015   Low back pain 10/25/2015   Herniated nucleus pulposus, C5-6 right 10/06/2015   Dysesthesia 09/16/2015   Spasticity 09/16/2015   Unsteady gait 09/16/2015   Multiple sclerosis ([place]) 06/08/2011    Andrey Spearman, MS, OTR/L, CLT-LANA 07/13/21 9:02 AM   [place], [place], [address] Phone: [phone]   Fax:  [phone]  Name: Dustin Gray MRN: [id number] Date of Birth: 30-Nov-1955

## 2021-07-17 ENCOUNTER — Encounter: Payer: Medicare PPO | Admitting: Occupational Therapy

## 2021-07-17 ENCOUNTER — Ambulatory Visit: Payer: Medicare PPO | Admitting: Physical Therapy

## 2021-07-17 DIAGNOSIS — Z79899 Other long term (current) drug therapy: Secondary | ICD-10-CM | POA: Diagnosis not present

## 2021-07-17 DIAGNOSIS — M4807 Spinal stenosis, lumbosacral region: Secondary | ICD-10-CM | POA: Diagnosis not present

## 2021-07-17 DIAGNOSIS — G35 Multiple sclerosis: Secondary | ICD-10-CM | POA: Diagnosis not present

## 2021-07-19 ENCOUNTER — Other Ambulatory Visit: Payer: Self-pay

## 2021-07-19 ENCOUNTER — Ambulatory Visit: Payer: Medicare PPO | Admitting: Physical Therapy

## 2021-07-19 ENCOUNTER — Ambulatory Visit: Payer: Medicare PPO | Admitting: Occupational Therapy

## 2021-07-19 DIAGNOSIS — R269 Unspecified abnormalities of gait and mobility: Secondary | ICD-10-CM | POA: Diagnosis not present

## 2021-07-19 DIAGNOSIS — E0500 Thyrotoxicosis with diffuse goiter without thyrotoxic crisis or storm: Secondary | ICD-10-CM | POA: Diagnosis not present

## 2021-07-19 DIAGNOSIS — R2689 Other abnormalities of gait and mobility: Secondary | ICD-10-CM

## 2021-07-19 DIAGNOSIS — I890 Lymphedema, not elsewhere classified: Secondary | ICD-10-CM

## 2021-07-19 DIAGNOSIS — R262 Difficulty in walking, not elsewhere classified: Secondary | ICD-10-CM

## 2021-07-19 DIAGNOSIS — R2681 Unsteadiness on feet: Secondary | ICD-10-CM | POA: Diagnosis not present

## 2021-07-19 DIAGNOSIS — M6281 Muscle weakness (generalized): Secondary | ICD-10-CM | POA: Diagnosis not present

## 2021-07-19 DIAGNOSIS — R278 Other lack of coordination: Secondary | ICD-10-CM | POA: Diagnosis not present

## 2021-07-19 NOTE — Therapy (Signed)
[place] MAIN [facility] SERVICES [address] [place], [place], [address] Phone: [phone]   Fax:  [phone]  Physical Therapy Treatment/ PT RECERT note   Patient Details  Name: Dustin Gray MRN: [id number] Date of Birth: 06-16-1956 No data recorded  Encounter Date: 07/19/2021   PT End of Session - 07/19/21 1555     Visit Number 33    Number of Visits 43    Date for PT Re-Evaluation 10/11/21    Authorization Type [employer]    Authorization Time Period 01/31/21-04/25/21    PT Start Time 1504    PT Stop Time 1545    PT Time Calculation (min) 41 min    Equipment Utilized During Treatment Gait belt    Activity Tolerance Patient tolerated treatment well;No increased pain;Patient limited by fatigue    Behavior During Therapy [facility] for tasks assessed/performed             Past Medical History:  Diagnosis Date   Abscess    groin   Arthritis    lower spine   Erectile dysfunction    Low testosterone    Lumbar herniated disc    L5   Multiple sclerosis (HCC)    Staph aureus infection     Past Surgical History:  Procedure Laterality Date   COLONOSCOPY WITH PROPOFOL N/A 07/04/2016   Procedure: COLONOSCOPY WITH PROPOFOL;  Surgeon: [name], MD;  Location: [place];  Service: Endoscopy;  Laterality: N/A;   [place]   POLYPECTOMY  07/04/2016   Procedure: POLYPECTOMY;  Surgeon: [name], MD;  Location: [place];  Service: Endoscopy;;   TONSILLECTOMY      There were no vitals filed for this visit.   Subjective Assessment - 07/19/21 1507     Subjective Pt reports having neurology appointment earlier this week. Pt reports resident saw him and they are going to focus on getting him back in to see a MD at the spine center in order to perform a laminsctomy on L2/L3. Pt has order for new MRI in preperation ofr this surgery. MD reports some of his bowel issues are coming from  the L2/L3 area issue.    Pertinent History Patient presents to physical therapy for unsteadiness, walking instability, fatigue. His PMH includes paroxysmal a fib, MS, lymphedema, hypothyroidism, Graves Disease, arthritis, lumbar herniated disc (L5), depression and neuropathy. Patient first developed symptoms of MS in 2005 and was diagnosed in 2006. Patient has done PT in the past, but hasn't been seen since 2020 at [facility]. Has not been doing his exercises in the past year. Walk outside to garage to ride lawnmower, walks in house. 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 a couple hours    How long can you stand comfortably? no more than 10 minutes before pain, have to steady self immediately    How long can you walk comfortably? has to use rollator. can walk in a store with a cart    Patient Stated Goals want to get stronger and feel mor comfortable walking around. Patient would like to continue strengthening hips and improve endurance and ability to pace self for safety while walking.    Currently in Pain? No/denies             Exercise/Activity Sets/Reps/Time/ Resistance Assistance  Charge type Comments  STS with 2 inch step under R LE 3 x 5  CGA  There ACT To improve L LE strength and weightbearing  Significant difficulty noticed toward end of sets, adequate rest between sets provided for optimal results   Ball squeeze 15 x 5 sec hold  There ex  Seated   Dual task balance (word game on airex pad in semi-tandem stance with L LE posterior)  X 2 rounds  CGA  Neuro  Some sway and self correction noted throughout. Good balance display overall with initial ual task of airex in semi tandem stance.  Increased difficulty second round with L LE on theraband pad under L LE and airex under R LE, rest provided due to fatigue mid through round 2   Step taps (2x  airex pads)  X 10 ea LE CGA, B UE  Neuro  To improve balance with stepping up and to improve L LE neuromuscular control.  External cue of theraband pad for target with L LE stepping up.   Squats  X 10  B UE  Therex Cues for equal weightbearing bilaterally                                       Treatment Provided this session   Pt educated throughout session about proper posture and technique with exercises. Improved exercise technique, movement at target joints, use of target muscles after min to mod verbal, visual, tactile cues.  Note: Portions of this document were prepared using Dragon voice recognition software and although reviewed may contain unintentional dictation errors in syntax, grammar, or spelling.  Pt required occasional rest breaks due fatigue, PT was quick to ask when pt appeared to be fatiguing in order to prevent excessive fatigue.                            PT Education - 07/19/21 1555     Education Details exercise technique body mechanics    Person(s) Educated Patient    Methods Explanation;Demonstration    Comprehension Verbalized understanding;Returned demonstration              PT Short Term Goals - 07/19/21 1603       PT SHORT TERM GOAL #1   Title Patient will be independent in home exercise program to improve strength/mobility for better functional independence with ADLs.    Baseline 8/9: HEP to be given next session, 10/31: patient reports compliance with HEP and would like progressions added next session.    Time 4    Period Weeks    Status On-going    Target Date 08/16/21               PT Long Term Goals - 07/19/21 1603       PT LONG TERM GOAL #1   Title Patient will increase FOTO score to equal to or greater than 70%    to demonstrate statistically significant improvement in mobility and quality of life.    Baseline 8/9: 64%; risk adjusted 34%; 03/21/21 FOTO: 45; 04/24/21 FOTO: 49%    Time 12    Period Weeks     Status Partially Met    Target Date 10/11/21      PT LONG TERM GOAL #2   Title Patient will increase Berg Balance score by > 6 points to demonstrate decreased fall  risk during functional activities.    Baseline 8/9: BBT 29/56; 03/21/21: 42/56    Time 12    Period Weeks    Status Achieved      PT LONG TERM GOAL #3   Title Patient will increase 10 meter walk test to >1.30ms as to improve gait speed for better community ambulation and to reduce fall risk.    Baseline 8/9: 0.65 m/s with rollator; 10/31: 0.68 m/s with rollator. 07/06/2021= 0.71 m/s using rollator    Time 12    Period Weeks    Status On-going    Target Date 10/11/21      PT LONG TERM GOAL #4   Title Patient will increase six minute walk test distance to >1000 for progression to community ambulator and improve gait ability    Baseline 8/9: 505 ft with rollator; 03/21/21: [id number] rollator, 04/24/21: [id number] rollator; 07/06/2021=900 feet    Time 12    Period Weeks    Status On-going    Target Date 10/11/21      PT LONG TERM GOAL #5   Title Patient will increase glute medius strength on Left LE from 3-/5 to 4/5 to improve stability, gait mechanics, and functional strength.    Baseline 10/31: 3-/5; 07/06/2021= 3-/5    Time 12    Period Weeks    Status On-going    Target Date 10/11/21      PT LONG TERM GOAL #6   Title Patient will increase Left single leg stance time to 15 seconds or greater to increase safety in shower and independence with ADLs.    Baseline 10/31; 1.83 sec without UE support    Time 12    Period Weeks    Status New    Target Date 10/11/21                   Plan - 07/19/21 1601     Clinical Impression Statement Pt resents to PT this session with excellent motivation for completion of PT program. Pt presents for recertification note this date. Goals were assessed and updated 2 visits previously during documented progress note. He was able to demonstrate progress including much improved  overall gait velocity and 6 min walk test indicating a decreased risk of falling. Pt is still progressing toward his FOTO goal indicating he has room for imporvmeent in his subjective level of functioning. Patient's condition has the potential to improve in response to therapy. Maximum improvement is yet to be obtained. The anticipated improvement is attainable and reasonable in a generally predictable time. Patient will continue to benefit from skilled physical therapy intervention in order to improve his strength, ambulatory capacity, balance, and increase his overall function and safety with everyday ambulation    Personal Factors and Comorbidities Age;Comorbidity 3+;Fitness;Past/Current Experience;Time since onset of injury/illness/exacerbation    Comorbidities aroxysmal a fib, MS, lymphedema, hypothyroidism, Graves Disease, arthritis, lumbar herniated disc (L5), depression and neuropathy    Examination-Activity Limitations Bed Mobility;Bend;Caring for Others;Carry;Reach Overhead;Locomotion Level;Lift;Hygiene/Grooming;Dressing;Squat;Stairs;Stand;Toileting;Transfers    Examination-Participation Restrictions Cleaning;Community Activity;Laundry;Occupation;Shop;Volunteer;Yard Work    StaMerchant navy officerolving/Moderate complexity    Rehab Potential Fair    PT Frequency 2x / week    PT Duration 12 weeks    PT Treatment/Interventions ADLs/Self Care Home Management;Aquatic Therapy;Canalith Repostioning;Biofeedback;Cryotherapy;Ultrasound;Traction;Moist Heat;Electrical Stimulation;DME Instruction;Gait training;Therapeutic exercise;Therapeutic activities;Functional mobility training;Stair training;Balance training;Neuromuscular re-education;Patient/family education;Manual techniques;Orthotic Fit/Training;Compression bandaging;Passive range of motion;Vestibular;Taping;Splinting;Energy conservation;Dry needling;Visual/perceptual remediation/compensation    PT Home Exercise Plan Access Code:  [email or web address]: [email or web address]  Added [employer]  with BTB and encouraged more walking around the home during commerical breaks when watching TV.    Consulted and Agree with Plan of Care Patient             Patient will benefit from skilled therapeutic intervention in order to improve the following deficits and impairments:  Abnormal gait, Cardiopulmonary status limiting activity, Decreased activity tolerance, Decreased coordination, Decreased balance, Decreased endurance, Decreased mobility, Decreased strength, Decreased range of motion, Difficulty walking, Impaired flexibility, Impaired sensation, Postural dysfunction, Improper body mechanics  Visit Diagnosis: Abnormality of gait and mobility - Plan: PT plan of care cert/re-cert  Difficulty in walking, not elsewhere classified - Plan: PT plan of care cert/re-cert  Unsteadiness on feet - Plan: PT plan of care cert/re-cert  Other abnormalities of gait and mobility - Plan: PT plan of care cert/re-cert     Problem List Patient Active Problem List   Diagnosis Date Noted   Persistent atrial fibrillation (HCC)    Graves disease    Abdominal bloating    Atrial fibrillation with RVR ([place]) 10/01/2019   Hyperthyroidism    Atrial fibrillation with rapid ventricular response ([place]) 09/30/2019   Leg edema    Left leg cellulitis    Special screening for malignant neoplasms, colon    Polyp of sigmoid colon    Benign neoplasm of ascending colon    Rectal polyp    Herniated nucleus pulposus, L5-S1 11/09/2015   Low back pain 10/25/2015   Herniated nucleus pulposus, C5-6 right 10/06/2015   Dysesthesia 09/16/2015   Spasticity 09/16/2015   Unsteady gait 09/16/2015   Multiple sclerosis ([place]) 06/08/2011    Particia Lather, PT 07/19/2021, 4:57 PM  [place] [address] [place], [place], [address] Phone: [phone]   Fax:  [phone]  Name: Dustin Gray MRN: [id number] Date of Birth: [birth year]

## 2021-07-21 NOTE — Therapy (Signed)
[place] MAIN [facility] SERVICES [address] [place], [place], [address] Phone: [phone]   Fax:  [phone]  Occupational Therapy Treatment  Patient Details  Name: Dustin Gray MRN: [id number] Date of Birth: [birth year] Referring Provider (OT): Staci Acosta, MD   Encounter Date: 07/19/2021   OT End of Session - 07/21/21 1313     Visit Number 6    Number of Visits 36    Date for OT Re-Evaluation 08/23/21    OT Start Time 0200    OT Stop Time 0300    OT Time Calculation (min) 60 min    Activity Tolerance Patient tolerated treatment well;No increased pain    Behavior During Therapy WFL for tasks assessed/performed             Past Medical History:  Diagnosis Date   Abscess    groin   Arthritis    lower spine   Erectile dysfunction    Low testosterone    Lumbar herniated disc    L5   Multiple sclerosis (HCC)    Staph aureus infection     Past Surgical History:  Procedure Laterality Date   COLONOSCOPY WITH PROPOFOL N/A 07/04/2016   Procedure: COLONOSCOPY WITH PROPOFOL;  Surgeon: [name], MD;  Location: [place];  Service: Endoscopy;  Laterality: N/A;   [place]   POLYPECTOMY  07/04/2016   Procedure: POLYPECTOMY;  Surgeon: [name], MD;  Location: [place];  Service: Endoscopy;;   TONSILLECTOMY      There were no vitals filed for this visit.   Subjective Assessment - 07/21/21 1309     Subjective  Dustin Gray Gray for OT visit 6/36 to address BLE lymphedema.  Pt reports 0/10 LE related pain. Pt reports he still has not  located spare compression wraps.    Pertinent History relevant to LE: HTN, MS, Herniated C-5-C6, L5-S1, chronic low back pain, persistent Afib,Hx LLE cellulitis, Graves disease, HYPERthyroidism, OA    Limitations difficulty walking, impaired functional mobility and transfers, impaired balance, muscle weakness, L>R,, altered  sensation, chronic back pain , chronic leg swelling and associated pain, spasticity in legs    Repetition Increases Symptoms    Special Tests +Stemmer sign, L>R; Intake FOTO: 51/100    Patient Stated Goals Learn about lymphedema and explore treatment options    Pain Onset More than a month ago    Pain Onset More than a month ago                          OT Treatments/Exercises (OP) - 07/21/21 1310       ADLs   ADL Education Given Yes      Manual Therapy   Manual Therapy Edema management;Compression Bandaging    Manual Lymphatic Drainage (MLD) MLD to LLE/LLQ utilizing short neck sequence, diaphragmatic breathing, functional inguinal LN and proximal nto distal J strokes to each lower extremity segment. Fially repeat 3 retrograde sweeps to terminus and repeat short neck.    Compression Bandaging 4 layer knee length compression wrap , including foot , from base of toes to popliteal fossa using stockinett as base layer, then single layer Rosidal foam under 1 each 8,10 and 12 cm wide short stretch bandages.                    OT Education - 07/21/21 1312  Education Details Continued Pt/ CG edu for lymphedema self care  and home program throughout session. Topics include multilayer, gradient compression wrapping, simple self-MLD, therapeutic lymphatic pumping exercises, skin/nail care, risk reduction factors and LE precautions, compression garments/recommendations and wear and care schedule and compression garment donning / doffing using assistive devices. All questions answered to the Pt's satisfaction, and Pt demonstrates understanding by report.    Person(s) Educated Patient    Methods Explanation;Demonstration;Handout    Comprehension Verbalized understanding;Returned demonstration;Need further instruction                 OT Long Term Goals - 05/28/21 1820       OT LONG TERM GOAL #1   Title Given this patients Intake score of 51/100 on the  functional outcomes FOTO tool, patient will experience an increase in function of 5 points to improve basic and instrumental ADLs performance, including lymphedema self-care.    Baseline Max A    Time 12    Period Weeks    Status New      OT LONG TERM GOAL #2   Title Pt will be able to apply knee length, multi-layer, short stretch compression wraps to one limb at a time using gradient techniques with MAX CG ASSISTANCE to decrease limb volume, to limit infection risk, and to limit lymphedema progression.    Baseline Dependent    Time 4    Period Days    Status New      OT LONG TERM GOAL #3   Title lymphedema self-care.  Pt will demonstrate understanding of lymphedema prevention strategies by identifying and discussing 5 precautions using printed reference (modified assistance) to reduce risk of progression and to limit infection risk.    Baseline Max A    Time 4    Period Days    Status New    Target Date --   4th OT Rx visit     OT LONG TERM GOAL #4   Title Pt will achieve at least a 10% limb volume reduction in bilateral legs to return limb to normal size and shape,  to limit lymphedema progression and to limit infection risk.    Baseline BLE comparative limb volumetrics TBA at 1st OT Rx visit. BY visual assessment both legs swollen by 10-15%, L>R    Time 12    Period Weeks    Status New    Target Date 08/23/21      OT LONG TERM GOAL #5   Title With MAX CG ASSISTANCE Pt will achieve and sustain a least 85% compliance with all 4 LE self-care home program components throughout Intensive Phase CDT, including modified simple self-MLD, Gray skin inspection and care, lymphatic pumping the ex, 23/7 compression wraps to sustain clinical gains made in CDT and to limit lymphedema progression and further functional decline.    Baseline Dependent    Time 12    Period Weeks    Status New    Target Date 08/23/21                   Plan - 07/21/21 1308     Clinical Impression  Statement Dustin Gray. Dustin Gray since last visit by report with good tolerance. LLE limb volume is dramtically reduced today and pitting is nearly resoolved. Commenced MLD to LLE/ZLLQ today completing proximal sequences and thigh. Reviewed lymphatic anatomy and function related to MLD   with Pt. Applied 2nd set  of multi layer compression stockings to LLE as established last session. Cont as per POC.    OT Occupational Profile and History Comprehensive Assessment- Review of records and extensive additional review of physical, cognitive, psychosocial history related to current functional performance    Occupational performance deficits (Please refer to evaluation for details): ADL's;IADL's;Work;Leisure;Social Participation;Other   role performance   Body Structure / Function / Physical Skills ADL;Edema;Skin integrity;Flexibility;Pain;ROM;Decreased knowledge of use of DME;Scar mobility;IADL    Rehab Potential Good    Clinical Decision Making Several treatment options, min-mod task modification necessary    Comorbidities Affecting Occupational Performance: Presence of comorbidities impacting occupational performance    Modification or Assistance to Complete Evaluation  Min-Moderate modification of tasks or assist with assess necessary to complete eval    OT Frequency 2x / week    OT Duration 12 weeks   and PRN for follow along   OT Treatment/Interventions Self-care/ADL training;Therapeutic exercise;Manual Therapy;Coping strategies training;Therapeutic activities;Manual lymph drainage;DME and/or AE instruction;Compression bandaging;Other (comment);Patient/family education   skin care to limit infection risk   Plan Complete Decongestive Therapy (CDT) One leg at a time to limit fall LLE first. Manual lymphatic drainage (MLD), skin care, ther ex, compression wraps, then    OT Home Exercise Plan Pt verbalized understanding that he requires  assistance with all lymphedema home care components, especially compression wrapping, between visits for optimal prognosis. Without assistance prognosis is poor    Recommended Other Services In insurance benefits available, garmentsConsider advanced sequential pneumatic compression device (Flexitouch) to maximize independence w lymphedema self-care at home over time. Pt unable to perform simple self-mld    Consulted and Agree with Plan of Care Patient             Patient will benefit from skilled therapeutic intervention in order to improve the following deficits and impairments:   Body Structure / Function / Physical Skills: ADL, Edema, Skin integrity, Flexibility, Pain, ROM, Decreased knowledge of use of DME, Scar mobility, IADL       Visit Diagnosis: Lymphedema, not elsewhere classified    Problem List Patient Active Problem List   Diagnosis Date Noted   Persistent atrial fibrillation (HCC)    Graves disease    Abdominal bloating    Atrial fibrillation with RVR ([place]) 10/01/2019   Hyperthyroidism    Atrial fibrillation with rapid ventricular response ([place]) 09/30/2019   Leg edema    Left leg cellulitis    Special screening for malignant neoplasms, colon    Polyp of sigmoid colon    Benign neoplasm of ascending colon    Rectal polyp    Herniated nucleus pulposus, L5-S1 11/09/2015   Low back pain 10/25/2015   Herniated nucleus pulposus, C5-6 right 10/06/2015   Dysesthesia 09/16/2015   Spasticity 09/16/2015   Unsteady gait 09/16/2015   Multiple sclerosis ([place]) 06/08/2011    Andrey Spearman, MS, OTR/L, CLT-LANA 07/21/21 1:16 PM   [place] MAIN [facility] SERVICES [place], [place], [address] Phone: [phone]   Fax:  [phone]  Name: CLEATIS FANDRICH MRN: [id number] Date of Birth: 10-Oct-1955

## 2021-07-21 NOTE — Therapy (Signed)
[place] MAIN [facility] SERVICES [place], [place], [address] Phone: [phone]   Fax:  [phone]  Occupational Therapy Treatment  Patient Details  Name: Dustin Gray MRN: [id number] Date of Birth: March 19, 1956 Referring Provider (OT): Staci Acosta, MD   Encounter Date: 07/19/2021    Past Medical History:  Diagnosis Date   Abscess    groin   Arthritis    lower spine   Erectile dysfunction    Low testosterone    Lumbar herniated disc    L5   Multiple sclerosis ([place])    Staph aureus infection     Past Surgical History:  Procedure Laterality Date   COLONOSCOPY WITH PROPOFOL N/A 07/04/2016   Procedure: COLONOSCOPY WITH PROPOFOL;  Surgeon: [name], MD;  Location: House;  Service: Endoscopy;  Laterality: N/A;   [place]   POLYPECTOMY  07/04/2016   Procedure: POLYPECTOMY;  Surgeon: [name], MD;  Location: [place];  Service: Endoscopy;;   TONSILLECTOMY      There were no vitals filed for this visit.                              OT Long Term Goals - 05/28/21 1820       OT LONG TERM GOAL #1   Title Given this patients Intake score of 51/100 on the functional outcomes FOTO tool, patient will experience an increase in function of 5 points to improve basic and instrumental ADLs performance, including lymphedema self-care.    Baseline Max A    Time 12    Period Weeks    Status New      OT LONG TERM GOAL #2   Title Pt will be able to apply knee length, multi-layer, short stretch compression wraps to one limb at a time using gradient techniques with MAX CG ASSISTANCE to decrease limb volume, to limit infection risk, and to limit lymphedema progression.    Baseline Dependent    Time 4    Period Days    Status New      OT LONG TERM GOAL #3   Title lymphedema self-care.  Pt will demonstrate understanding of lymphedema  prevention strategies by identifying and discussing 5 precautions using printed reference (modified assistance) to reduce risk of progression and to limit infection risk.    Baseline Max A    Time 4    Period Days    Status New    Target Date --   4th OT Rx visit     OT LONG TERM GOAL #4   Title Pt will achieve at least a 10% limb volume reduction in bilateral legs to return limb to normal size and shape,  to limit lymphedema progression and to limit infection risk.    Baseline BLE comparative limb volumetrics TBA at 1st OT Rx visit. BY visual assessment both legs swollen by 10-15%, L>R    Time 12    Period Weeks    Status New    Target Date 08/23/21      OT LONG TERM GOAL #5   Title With MAX CG ASSISTANCE Pt will achieve and sustain a least 85% compliance with all 4 LE self-care home program components throughout Intensive Phase CDT, including modified simple self-MLD, daily skin inspection and care, lymphatic pumping the ex, 23/7 compression wraps to sustain clinical gains made in CDT and  to limit lymphedema progression and further functional decline.    Baseline Dependent    Time 12    Period Weeks    Status New    Target Date 08/23/21                    Patient will benefit from skilled therapeutic intervention in order to improve the following deficits and impairments:           Visit Diagnosis: Lymphedema, not elsewhere classified    Problem List Patient Active Problem List   Diagnosis Date Noted   Persistent atrial fibrillation ([place])    Graves disease    Abdominal bloating    Atrial fibrillation with RVR ([place]) 10/01/2019   Hyperthyroidism    Atrial fibrillation with rapid ventricular response ([place]) 09/30/2019   Leg edema    Left leg cellulitis    Special screening for malignant neoplasms, colon    Polyp of sigmoid colon    Benign neoplasm of ascending colon    Rectal polyp    Herniated nucleus pulposus, L5-S1 11/09/2015   Low back pain 10/25/2015    Herniated nucleus pulposus, C5-6 right 10/06/2015   Dysesthesia 09/16/2015   Spasticity 09/16/2015   Unsteady gait 09/16/2015   Multiple sclerosis ([place]) 06/08/2011    Ansel Bong, OT 07/21/2021, 1:07 PM  [place] MAIN [facility] SERVICES [address] [place], [place], [address] Phone: [phone]   Fax:  [phone]  Name: DESHANE COTRONEO MRN: [id number] Date of Birth: 1956/03/25

## 2021-07-24 ENCOUNTER — Other Ambulatory Visit: Payer: Self-pay

## 2021-07-24 ENCOUNTER — Ambulatory Visit: Payer: Medicare PPO

## 2021-07-24 ENCOUNTER — Ambulatory Visit: Payer: Medicare PPO | Admitting: Occupational Therapy

## 2021-07-24 DIAGNOSIS — I890 Lymphedema, not elsewhere classified: Secondary | ICD-10-CM

## 2021-07-24 DIAGNOSIS — R262 Difficulty in walking, not elsewhere classified: Secondary | ICD-10-CM | POA: Diagnosis not present

## 2021-07-24 DIAGNOSIS — R2689 Other abnormalities of gait and mobility: Secondary | ICD-10-CM | POA: Diagnosis not present

## 2021-07-24 DIAGNOSIS — R278 Other lack of coordination: Secondary | ICD-10-CM | POA: Diagnosis not present

## 2021-07-24 DIAGNOSIS — M6281 Muscle weakness (generalized): Secondary | ICD-10-CM | POA: Diagnosis not present

## 2021-07-24 DIAGNOSIS — R269 Unspecified abnormalities of gait and mobility: Secondary | ICD-10-CM

## 2021-07-24 DIAGNOSIS — R2681 Unsteadiness on feet: Secondary | ICD-10-CM

## 2021-07-24 NOTE — Therapy (Signed)
[place] MAIN [facility] SERVICES [address] [place], [place], [address] Phone: [phone]   Fax:  [phone]  Physical Therapy Treatment  Patient Details  Name: Dustin Gray MRN: [id number] Date of Birth: [birth year] No data recorded  Encounter Date: 07/24/2021   PT End of Session - 07/24/21 1417     Visit Number 34    Number of Visits 43    Date for PT Re-Evaluation 10/11/21    Authorization Type [employer]    Authorization Time Period 01/31/21-04/25/21    PT Start Time 1501    PT Stop Time 1545    PT Time Calculation (min) 44 min    Equipment Utilized During Treatment Gait belt    Activity Tolerance Patient tolerated treatment well;No increased pain;Patient limited by fatigue    Behavior During Therapy [facility] for tasks assessed/performed             Past Medical History:  Diagnosis Date   Abscess    groin   Arthritis    lower spine   Erectile dysfunction    Low testosterone    Lumbar herniated disc    L5   Multiple sclerosis ([facility])    Staph aureus infection     Past Surgical History:  Procedure Laterality Date   COLONOSCOPY WITH PROPOFOL N/A 07/04/2016   Procedure: COLONOSCOPY WITH PROPOFOL;  Surgeon: [name], MD;  Location: [place];  Service: Endoscopy;  Laterality: N/A;   [place]   POLYPECTOMY  07/04/2016   Procedure: POLYPECTOMY;  Surgeon: [name], MD;  Location: [place];  Service: Endoscopy;;   TONSILLECTOMY      There were no vitals filed for this visit.   Subjective Assessment - 07/24/21 1520     Subjective Patient saw lymphedema prior to PT session. Has a compression stocking on L lower limb. No falls or LOB since last session.    Pertinent History Patient presents to physical therapy for unsteadiness, walking instability, fatigue. His PMH includes paroxysmal a fib, MS, lymphedema, hypothyroidism, Graves Disease, arthritis, lumbar herniated  disc (L5), depression and neuropathy. Patient first developed symptoms of MS in 2005 and was diagnosed in 2006. Patient has done PT in the past, but hasn't been seen since 2020 at [facility]. Has not been doing his exercises in the past year. Walk outside to garage to ride lawnmower, walks in house. 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 a couple hours    How long can you stand comfortably? no more than 10 minutes before pain, have to steady self immediately    How long can you walk comfortably? has to use rollator. can walk in a store with a cart    Patient Stated Goals want to get stronger and feel mor comfortable walking around. Patient would like to continue strengthening hips and improve endurance and ability to pace self for safety while walking.    Currently in Pain? No/denies                  By support bar: Neuro re-ed  Airex pad 6" step: modified tandem horizontal and vertical scans with reaches outside BOS x 6 minutes with word game for dual task Airex pad ball toss with SPT and CGA x2 minutes Modified tandem stance two airex pads ball  toss x8 passes each direction Anterior/posterior weight shift on tilt board 10x each LE  Medial lateral weight shift on tilt board 10x each direction SLS each LE 30 seconds with BUE support Modified forward lunge onto bosu ball 10x each LE; heavy BUE support   TherEx   high knee march full length of // bars  Standing with # 4lb ankle weight: CGA for stability  -Hip extension with B upper extremity support, cueing for neutral hip alignment, upright posture for optimal muscle recruitment, and sequencing, 10x each LE,  -Hip abduction with B upper extremity support, cueing for neutral foot alignment for correct muscle activation, 10x each LE -Hip flexion with B upper extremity support, cueing for body  mechanics, speed of muscle recruitment for optimal strengthening and stabilization 10x each LE -Hamstring curl with B upper extremity support, cueing for knee alignment for recruitment of hamstring musculature, 10x each LE   Seated  -Seated marches with upright posture, back away from back of chair for abdominal/trunk activation/stabilization, 10x each LE; unable to lift LLE but has muscle activation  -Seated LAQ with 3 second holds, 10x each LE, cueing for muscle activation and sequencing for neutral alignment -Seated IR/ER with cueing for stabilizing knee placement with lateral foot movement for optimal muscle recruitment, 10x each LE  Pt educated throughout session about proper posture and technique with exercises. Improved exercise technique, movement at target joints, use of target muscles after min to mod verbal, visual, tactile cues.     Patient tolerates session well with focus on strengthening and stability . Utilization of 4lb ankle weights is fatiguing for patient requiring multiple seated rest breaks. Stabilization on unstable surface is improving greatly with decreased episodes of knee hyperextension. . Patient will continue to benefit from skilled physical therapy intervention in order to improve his strength, ambulatory capacity, balance, and increase his overall function and safety with everyday ambulation                     PT Education - 07/24/21 1417     Education Details exercise technique, body mechanics    Person(s) Educated Patient    Methods Explanation;Demonstration;Tactile cues;Verbal cues    Comprehension Verbalized understanding;Returned demonstration;Verbal cues required;Tactile cues required              PT Short Term Goals - 07/19/21 1603       PT SHORT TERM GOAL #1   Title Patient will be independent in home exercise program to improve strength/mobility for better functional independence with ADLs.    Baseline 8/9: HEP to be given  next session, 10/31: patient reports compliance with HEP and would like progressions added next session.    Time 4    Period Weeks    Status On-going    Target Date 08/16/21               PT Long Term Goals - 07/19/21 1603       PT LONG TERM GOAL #1   Title Patient will increase FOTO score to equal to or greater than 70%    to demonstrate statistically significant improvement in mobility and quality of life.    Baseline 8/9: 64%; risk adjusted 34%; 03/21/21 FOTO: 45; 04/24/21 FOTO: 49%    Time 12    Period Weeks    Status Partially Met    Target Date 10/11/21      PT LONG TERM GOAL #2   Title Patient will increase Berg Balance score by >  6 points to demonstrate decreased fall risk during functional activities.    Baseline 8/9: BBT 29/56; 03/21/21: 42/56    Time 12    Period Weeks    Status Achieved      PT LONG TERM GOAL #3   Title Patient will increase 10 meter walk test to >1.56ms as to improve gait speed for better community ambulation and to reduce fall risk.    Baseline 8/9: 0.65 m/s with rollator; 10/31: 0.68 m/s with rollator. 07/06/2021= 0.71 m/s using rollator    Time 12    Period Weeks    Status On-going    Target Date 10/11/21      PT LONG TERM GOAL #4   Title Patient will increase six minute walk test distance to >1000 for progression to community ambulator and improve gait ability    Baseline 8/9: 505 ft with rollator; 03/21/21: [id number] rollator, 04/24/21: [id number] rollator; 07/06/2021=900 feet    Time 12    Period Weeks    Status On-going    Target Date 10/11/21      PT LONG TERM GOAL #5   Title Patient will increase glute medius strength on Left LE from 3-/5 to 4/5 to improve stability, gait mechanics, and functional strength.    Baseline 10/31: 3-/5; 07/06/2021= 3-/5    Time 12    Period Weeks    Status On-going    Target Date 10/11/21      PT LONG TERM GOAL #6   Title Patient will increase Left single leg stance time to 15 seconds or greater to  increase safety in shower and independence with ADLs.    Baseline 10/31; 1.83 sec without UE support    Time 12    Period Weeks    Status New    Target Date 10/11/21                   Plan - 07/24/21 1544     Clinical Impression Statement Patient tolerates session well with focus on strengthening and stability . Utilization of 4lb ankle weights is fatiguing for patient requiring multiple seated rest breaks. Stabilization on unstable surface is improving greatly with decreased episodes of knee hyperextension. . Patient will continue to benefit from skilled physical therapy intervention in order to improve his strength, ambulatory capacity, balance, and increase his overall function and safety with everyday ambulation    Personal Factors and Comorbidities Age;Comorbidity 3+;Fitness;Past/Current Experience;Time since onset of injury/illness/exacerbation    Comorbidities aroxysmal a fib, MS, lymphedema, hypothyroidism, Graves Disease, arthritis, lumbar herniated disc (L5), depression and neuropathy    Examination-Activity Limitations Bed Mobility;Bend;Caring for Others;Carry;Reach Overhead;Locomotion Level;Lift;Hygiene/Grooming;Dressing;Squat;Stairs;Stand;Toileting;Transfers    Examination-Participation Restrictions Cleaning;Community Activity;Laundry;Occupation;Shop;Volunteer;Yard Work    StaMerchant navy officerolving/Moderate complexity    Rehab Potential Fair    PT Frequency 2x / week    PT Duration 12 weeks    PT Treatment/Interventions ADLs/Self Care Home Management;Aquatic Therapy;Canalith Repostioning;Biofeedback;Cryotherapy;Ultrasound;Traction;Moist Heat;Electrical Stimulation;DME Instruction;Gait training;Therapeutic exercise;Therapeutic activities;Functional mobility training;Stair training;Balance training;Neuromuscular re-education;Patient/family education;Manual techniques;Orthotic Fit/Training;Compression bandaging;Passive range of  motion;Vestibular;Taping;Splinting;Energy conservation;Dry needling;Visual/perceptual remediation/compensation    PT Home Exercise Plan Access Code: [email or web address]: [email or web address]  Added [place] with BTB and encouraged more walking around the home during commerical breaks when watching TV.    Consulted and Agree with Plan of Care Patient             Patient will benefit from skilled therapeutic intervention in order to improve the following deficits and impairments:  Abnormal gait, Cardiopulmonary status limiting activity,  Decreased activity tolerance, Decreased coordination, Decreased balance, Decreased endurance, Decreased mobility, Decreased strength, Decreased range of motion, Difficulty walking, Impaired flexibility, Impaired sensation, Postural dysfunction, Improper body mechanics  Visit Diagnosis: Abnormality of gait and mobility  Unsteadiness on feet  Other abnormalities of gait and mobility     Problem List Patient Active Problem List   Diagnosis Date Noted   Persistent atrial fibrillation ([facility])    Graves disease    Abdominal bloating    Atrial fibrillation with RVR ([place]) 10/01/2019   Hyperthyroidism    Atrial fibrillation with rapid ventricular response ([place]) 09/30/2019   Leg edema    Left leg cellulitis    Special screening for malignant neoplasms, colon    Polyp of sigmoid colon    Benign neoplasm of ascending colon    Rectal polyp    Herniated nucleus pulposus, L5-S1 11/09/2015   Low back pain 10/25/2015   Herniated nucleus pulposus, C5-6 right 10/06/2015   Dysesthesia 09/16/2015   Spasticity 09/16/2015   Unsteady gait 09/16/2015   Multiple sclerosis ([place]) 06/08/2011  Janna Arch, PT, DPT  07/24/2021, 3:46 PM  [place] MAIN [facility] SERVICES [address] [place], [place], [address] Phone: [phone]   Fax:  [phone]  Name: MUBARAK BEVENS MRN: [id number] Date of Birth: [birth year]

## 2021-07-24 NOTE — Patient Instructions (Signed)

## 2021-07-24 NOTE — Therapy (Signed)
[place] MAIN [facility] SERVICES [address] [place], [place], [address] Phone: [phone]   Fax:  [phone]  Occupational Therapy Treatment  Patient Details  Name: Dustin Gray MRN: [id number] Date of Birth: Jun 17, 1956 Referring Provider (OT): Staci Acosta, MD   Encounter Date: 07/24/2021   OT End of Session - 07/24/21 1412     Visit Number 7    Number of Visits 36    Date for OT Re-Evaluation 08/23/21    OT Start Time 0207    OT Stop Time 0300    OT Time Calculation (min) 53 min    Activity Tolerance Patient tolerated treatment well;No increased pain    Behavior During Therapy WFL for tasks assessed/performed             Past Medical History:  Diagnosis Date   Abscess    groin   Arthritis    lower spine   Erectile dysfunction    Low testosterone    Lumbar herniated disc    L5   Multiple sclerosis (HCC)    Staph aureus infection     Past Surgical History:  Procedure Laterality Date   COLONOSCOPY WITH PROPOFOL N/A 07/04/2016   Procedure: COLONOSCOPY WITH PROPOFOL;  Surgeon: [name], MD;  Location: [place];  Service: Endoscopy;  Laterality: N/A;   [place]   POLYPECTOMY  07/04/2016   Procedure: POLYPECTOMY;  Surgeon: [name], MD;  Location: [place];  Service: Endoscopy;;   TONSILLECTOMY      There were no vitals filed for this visit.   Subjective Assessment - 07/24/21 1413     Subjective  Dustin Gray presents for OT visit 7/36 to address BLE lymphedema.  Pt reports 0/10 LE related pain. Pt reports he was able to wrap daily since last visit.    Pertinent History relevant to LE: HTN, MS, Herniated C-5-C6, L5-S1, chronic low back pain, persistent Afib,Hx LLE cellulitis, Graves disease, HYPERthyroidism, OA    Limitations difficulty walking, impaired functional mobility and transfers, impaired balance, muscle weakness, L>R,, altered sensation,  chronic back pain , chronic leg swelling and associated pain, spasticity in legs    Repetition Increases Symptoms    Special Tests +Stemmer sign, L>R; Intake FOTO: 51/100    Patient Stated Goals Learn about lymphedema and explore treatment options    Pain Onset More than a month ago    Pain Onset More than a month ago                          OT Treatments/Exercises (OP) - 07/24/21 1607       ADLs   ADL Education Given Yes      Manual Therapy   Manual Therapy Edema management;Compression Bandaging    Manual Lymphatic Drainage (MLD) MLD to LLE/LLQ utilizing short neck sequence, diaphragmatic breathing, functional inguinal LN and proximal nto distal J strokes to each lower extremity segment. Fially repeat 3 retrograde sweeps to terminus and repeat short neck.    Compression Bandaging Juzo Dynamic , ccl 2 ( 30-40 mmHg) knee high, open toe compression stocking- loaned                    OT Education - 07/24/21 1608     Education Details Pt edu for donning/ doffing class 2 OTS circular knit compressin stocking using assistive devices    Person(s) Educated Patient  Methods Explanation;Demonstration;Handout    Comprehension Verbalized understanding;Returned demonstration;Need further instruction                 OT Long Term Goals - 05/28/21 1820       OT LONG TERM GOAL #1   Title Given this patients Intake score of 51/100 on the functional outcomes FOTO tool, patient will experience an increase in function of 5 points to improve basic and instrumental ADLs performance, including lymphedema self-care.    Baseline Max A    Time 12    Period Weeks    Status New      OT LONG TERM GOAL #2   Title Pt will be able to apply knee length, multi-layer, short stretch compression wraps to one limb at a time using gradient techniques with MAX CG ASSISTANCE to decrease limb volume, to limit infection risk, and to limit lymphedema progression.    Baseline  Dependent    Time 4    Period Days    Status New      OT LONG TERM GOAL #3   Title lymphedema self-care.  Pt will demonstrate understanding of lymphedema prevention strategies by identifying and discussing 5 precautions using printed reference (modified assistance) to reduce risk of progression and to limit infection risk.    Baseline Max A    Time 4    Period Days    Status New    Target Date --   4th OT Rx visit     OT LONG TERM GOAL #4   Title Pt will achieve at least a 10% limb volume reduction in bilateral legs to return limb to normal size and shape,  to limit lymphedema progression and to limit infection risk.    Baseline BLE comparative limb volumetrics TBA at 1st OT Rx visit. BY visual assessment both legs swollen by 10-15%, L>R    Time 12    Period Weeks    Status New    Target Date 08/23/21      OT LONG TERM GOAL #5   Title With MAX CG ASSISTANCE Pt will achieve and sustain a least 85% compliance with all 4 LE self-care home program components throughout Intensive Phase CDT, including modified simple self-MLD, daily skin inspection and care, lymphatic pumping the ex, 23/7 compression wraps to sustain clinical gains made in CDT and to limit lymphedema progression and further functional decline.    Baseline Dependent    Time 12    Period Weeks    Status New    Target Date 08/23/21                   Plan - 07/24/21 1601     Clinical Impression Statement Completed anatomical measurements of LLE to determine OTS compression garment Asize. Luckily OT has sample of recommended knee high,  Juzo Dynamic, ccl 2, size III, Reg length, open toe compression stocking, which fits well. After skilled teaching Pt able to don and doff stocking with extra time using assistive friction gloves and low friction Tyvek sock. Pt will borow stocking for visit interval to assess comfort and fit , and we'll assess compression and containment effectiveness next session based on our findings.  Provided RLE MLD as established for LLE, but did not apply multilatuyer wraps to RLE today. Pt tolerated treatment well without increased pain. OT loaned Pt friction gloves and friction sock for use between   visits.    OT Occupational Profile and History Comprehensive Assessment- Review of records and  extensive additional review of physical, cognitive, psychosocial history related to current functional performance    Occupational performance deficits (Please refer to evaluation for details): ADL's;IADL's;Work;Leisure;Social Participation;Other   role performance   Body Structure / Function / Physical Skills ADL;Edema;Skin integrity;Flexibility;Pain;ROM;Decreased knowledge of use of DME;Scar mobility;IADL    Rehab Potential Good    Clinical Decision Making Several treatment options, min-mod task modification necessary    Comorbidities Affecting Occupational Performance: Presence of comorbidities impacting occupational performance    Modification or Assistance to Complete Evaluation  Min-Moderate modification of tasks or assist with assess necessary to complete eval    OT Frequency 2x / week    OT Duration 12 weeks   and PRN for follow along   OT Treatment/Interventions Self-care/ADL training;Therapeutic exercise;Manual Therapy;Coping strategies training;Therapeutic activities;Manual lymph drainage;DME and/or AE instruction;Compression bandaging;Other (comment);Patient/family education   skin care to limit infection risk   Plan Complete Decongestive Therapy (CDT) One leg at a time to limit fall LLE first. Manual lymphatic drainage (MLD), skin care, ther ex, compression wraps, then    OT Home Exercise Plan Pt verbalized understanding that he requires assistance with all lymphedema home care components, especially compression wrapping, between visits for optimal prognosis. Without assistance prognosis is poor    Recommended Other Services In insurance benefits available, garmentsConsider advanced  sequential pneumatic compression device (Flexitouch) to maximize independence w lymphedema self-care at home over time. Pt unable to perform simple self-mld    Consulted and Agree with Plan of Care Patient             Patient will benefit from skilled therapeutic intervention in order to improve the following deficits and impairments:   Body Structure / Function / Physical Skills: ADL, Edema, Skin integrity, Flexibility, Pain, ROM, Decreased knowledge of use of DME, Scar mobility, IADL       Visit Diagnosis: Lymphedema, not elsewhere classified    Problem List Patient Active Problem List   Diagnosis Date Noted   Persistent atrial fibrillation (HCC)    Graves disease    Abdominal bloating    Atrial fibrillation with RVR ([place]) 10/01/2019   Hyperthyroidism    Atrial fibrillation with rapid ventricular response ([place]) 09/30/2019   Leg edema    Left leg cellulitis    Special screening for malignant neoplasms, colon    Polyp of sigmoid colon    Benign neoplasm of ascending colon    Rectal polyp    Herniated nucleus pulposus, L5-S1 11/09/2015   Low back pain 10/25/2015   Herniated nucleus pulposus, C5-6 right 10/06/2015   Dysesthesia 09/16/2015   Spasticity 09/16/2015   Unsteady gait 09/16/2015   Multiple sclerosis ([place]) 06/08/2011    Andrey Spearman, MS, OTR/L, CLT-LANA 07/24/21 4:09 PM   [place] [facility]IN [facility] SERVICES [address] [place], [place], [address] Phone: [phone]   Fax:  [phone]  Name: Dustin Gray MRN: [id number] Date of Birth: 1956-04-03

## 2021-07-26 DIAGNOSIS — E0500 Thyrotoxicosis with diffuse goiter without thyrotoxic crisis or storm: Secondary | ICD-10-CM | POA: Diagnosis not present

## 2021-07-27 ENCOUNTER — Ambulatory Visit: Payer: Medicare PPO | Admitting: Occupational Therapy

## 2021-07-27 ENCOUNTER — Ambulatory Visit: Payer: Medicare PPO

## 2021-07-31 ENCOUNTER — Ambulatory Visit: Payer: Medicare PPO | Admitting: Cardiology

## 2021-07-31 ENCOUNTER — Encounter: Payer: Self-pay | Admitting: Cardiology

## 2021-07-31 ENCOUNTER — Other Ambulatory Visit: Payer: Self-pay

## 2021-07-31 VITALS — BP 130/70 | HR 138 | Ht 74.0 in | Wt 253.0 lb

## 2021-07-31 DIAGNOSIS — E0590 Thyrotoxicosis, unspecified without thyrotoxic crisis or storm: Secondary | ICD-10-CM | POA: Diagnosis not present

## 2021-07-31 DIAGNOSIS — I480 Paroxysmal atrial fibrillation: Secondary | ICD-10-CM

## 2021-07-31 NOTE — Patient Instructions (Signed)
Medication Instructions:   Your physician recommends that you continue on your current medications as directed. Please refer to the Current Medication list given to you today.  *If you need a refill on your cardiac medications before your next appointment, please call your pharmacy*   Lab Work:  CBC, BMP drawn in office today.   Testing/Procedures:  You are scheduled for a Cardioversion on ___Wednesday 2/8/23_____________ with Dr.__Agbor-Etang_________ Please arrive at the [place] [facility] at __0630_______ a.m. on the day of your procedure.  DIET INSTRUCTIONS:   Nothing to eat or drink after midnight except your medications with a sip of water.         Labs: __Drawn in office 2/6/23________________  Medications:  YOU MAY TAKE ALL of your remaining medications with a small amount of water.  Must have a responsible person to drive you home.  Bring a current list of your medications and current insurance cards.    If you have any questions after you get home, please call the office at [phone]    Follow-Up: At [facility], you and your health needs are our priority.  As part of our continuing mission to provide you with exceptional heart care, we have created designated Provider Care Teams.  These Care Teams include your primary Cardiologist (physician) and Advanced Practice Providers (APPs -  Physician Assistants and Nurse Practitioners) who all work together to provide you with the care you need, when you need it.  We recommend signing up for the patient portal called "MyChart".  Sign up information is provided on this After Visit Summary.  MyChart is used to connect with patients for Virtual Visits (Telemedicine).  Patients are able to view lab/test results, encounter notes, upcoming appointments, etc.  Non-urgent messages can be sent to your provider as well.   To learn more about what you can do with MyChart, go to [email or web address].    Your next appointment:    4-6 week(s)  The format for your next appointment:   In Person  Provider:    ONLY WITH [name], MD    Other Instructions

## 2021-07-31 NOTE — Progress Notes (Signed)
Cardiology Office Note:    Date:  07/31/2021   ID:  Dustin Gray, DOB 02/22/1956, MRN [id number]  PCP:  [name], MD  Cardiologist:  [name], MD  Electrophysiologist:  None   Referring MD: [name], MD   Chief Complaint  Patient presents with   Other    6 month follow up -- Patient states he has a lot of stress at home with his wifes addiction. Meds reviewed verbally with patient.     History of Present Illness:    Dustin Gray is a 66 y.o. male with a hx of paroxysmal atrial fibrillation, multiple sclerosis, lymphedema, hypothyroidism/Graves' disease, who presents for follow-up.    Being seen for paroxysmal atrial fibrillation.  A-fib deemed secondary to hyperthyroidism.  Follows up with endocrinology, on medical therapy for thyroid dysfunction.  Last thyroid function testing results last month showed better control thyroid function.  TSH and T3 were normal, free T4 was slightly reduced.  He states being under a lot of stress at home, wife suffering from addiction to crack cocaine.  He also recently filed for bankruptcy.  He thinks being stressed lately because his tachycardia today as he is in atrial fibrillation with rapid ventricular response.   Prior notes Patient originally seen in the hospital for atrial fibrillation with rapid ventricular response.  He was managed with diltiazem and Eliquis.  He was subsequently diagnosed with hyperthyroidism and propanolol ER added to his medical regimen.   Echocardiogram on 09/2019 showed normal ejection fraction with EF 55 to 60%, left atrial size was also normal.   He was started on anticoagulation due to plan for cardioversion.  Cardioversion was attempted but unsuccessful.  His heart rates have improved when hyperthyroidism is controlled.  Past Medical History:  Diagnosis Date   Abscess    groin   Arthritis    lower spine   Erectile dysfunction    Low testosterone    Lumbar herniated disc    L5    Multiple sclerosis (HCC)    Staph aureus infection     Past Surgical History:  Procedure Laterality Date   COLONOSCOPY WITH PROPOFOL N/A 07/04/2016   Procedure: COLONOSCOPY WITH PROPOFOL;  Surgeon: [name], MD;  Location: [place];  Service: Endoscopy;  Laterality: N/A;   [place]   POLYPECTOMY  07/04/2016   Procedure: POLYPECTOMY;  Surgeon: [name], MD;  Location: [place] [facility];  Service: Endoscopy;;   TONSILLECTOMY      Current Medications: Current Meds  Medication Sig   apixaban (ELIQUIS) 5 MG TABS tablet Take 1 tablet (5 mg total) by mouth 2 (two) times daily.   baclofen (LIORESAL) 10 MG tablet Take 10 mg by mouth 3 (three) times daily.   diltiazem (CARDIZEM SR) 120 MG 12 hr capsule Take 1 capsule (120 mg total) by mouth 2 (two) times daily.   Dimethyl Fumarate 240 MG CPDR Take 240 mg by mouth 2 (two) times daily.    furosemide (LASIX) 40 MG tablet Take 1 tablet (40 mg total) by mouth as needed. With your potassium.   gabapentin (NEURONTIN) 300 MG capsule Take 300-600 mg by mouth See admin instructions. Take 1 capsule (300 mg) by mouth in the morning, take 1 capsule (300 mg) by mouth at midday, & take 2 capsules (600 mg) by mouth at bedtime.   meloxicam (MOBIC) 15 MG tablet Take 15 mg by mouth daily as needed for pain.  methimazole (TAPAZOLE) 10 MG tablet Take 1 tablet by mouth daily.   potassium chloride (KLOR-CON) 10 MEQ tablet Take 1 tablet (10 mEq total) by mouth as needed. Take with your Lasix.   propranolol ER (INDERAL LA) 80 MG 24 hr capsule Take 1 capsule (80 mg total) by mouth 2 (two) times daily.     Allergies:   Cephalexin, Ancef [cefazolin sodium], Cefadroxil, and Cefazolin   Social History   Socioeconomic History   Marital status: Legally Separated    Spouse name: Not on file   Number of children: Not on file   Years of education: Not on file   Highest education level: Not on file  Occupational History    Not on file  Tobacco Use   Smoking status: Former   Smokeless tobacco: Never   Tobacco comments:    smoked as teenager  Substance and Sexual Activity   Alcohol use: Yes    Comment: 2 drinks/month   Drug use: No   Sexual activity: Not on file  Other Topics Concern   Not on file  Social History Narrative   Not on file   Social Determinants of Health   Financial Resource Strain: Not on file  Food Insecurity: Not on file  Transportation Needs: Not on file  Physical Activity: Not on file  Stress: Not on file  Social Connections: Not on file     Family History: The patient's family history includes Diabetes in his father.  ROS:   Please see the history of present illness.     All other systems reviewed and are negative.  EKGs/Labs/Other Studies Reviewed:    The following studies were reviewed today:   EKG:  EKG is  ordered today.  The ekg ordered today demonstrates atrial fibrillation with rapid ventricular response, heart rate 138  Recent Labs: No results found for requested labs within last 8760 hours.  Recent Lipid Panel    Component Value Date/Time   CHOL 108 10/01/2019 0358   TRIG 81 10/01/2019 0358   HDL 35 (L) 10/01/2019 0358   CHOLHDL 3.1 10/01/2019 0358   VLDL 16 10/01/2019 0358   LDLCALC 57 10/01/2019 0358    Physical Exam:    VS:  BP 130/70 (BP Location: Left Arm, Patient Position: Sitting, Cuff Size: Normal)    Pulse (!) 138    Ht 6\' 2"  (1.88 m)    Wt 253 lb (114.8 kg)    SpO2 94%    BMI 32.48 kg/m     Wt Readings from Last 3 Encounters:  07/31/21 253 lb (114.8 kg)  01/27/21 244 lb (110.7 kg)  12/19/20 236 lb (107 kg)     GEN:  Well nourished, well developed in no acute distress HEENT: Normal NECK: No JVD; No carotid bruits LYMPHATICS: No lymphadenopathy CARDIAC: Regular rate and rhythm no murmurs, rubs, gallops RESPIRATORY:  Clear to auscultation without rales, wheezing or rhonchi  ABDOMEN: Soft, non-tender,  non-distended MUSCULOSKELETAL:  no edema; No deformity  SKIN: Warm and dry NEUROLOGIC:  Alert and oriented x 3 PSYCHIATRIC:  Normal affect   ASSESSMENT:    1. Paroxysmal atrial fibrillation (HCC)   2. Hyperthyroidism       PLAN:    In order of problems listed above:  Patient with history of paroxysmal atrial fibrillation.  EKG today shows atrial fibrillation, heart rate 138 .  Thyroid function appears adequately controlled.   A. fib usually controlled when thyroid function is adequately controlled.  Continue Cardizem SR 120 twice daily,  continue Inderal, continue Eliquis 5 mg twice daily.   CHA2DS2-VASc score 0.  A. fib driven by hyperthyroidism.  Plan DC cardioversion in 2 days if patient still in atrial fibrillation.  Refer to EP/A-fib clinic... hyperthyroidism.  On methimazole. management as per endocrinology.  Follow-up in 6 weeks  Total encounter time 40 minutes  Greater than 50% was spent in counseling and coordination of care with the patient  Shared Decision Making/Informed Consent The risks (stroke, cardiac arrhythmias rarely resulting in the need for a temporary or permanent pacemaker, skin irritation or burns and complications associated with conscious sedation including aspiration, arrhythmia, respiratory failure and death), benefits (restoration of normal sinus rhythm) and alternatives of a direct current cardioversion were explained in detail to Mr. Pike and he agrees to proceed.     Medication Adjustments/Labs and Tests Ordered: Current medicines are reviewed at length with the patient today.  Concerns regarding medicines are outlined above.  Orders Placed This Encounter  Procedures   Basic metabolic panel   CBC   Ambulatory referral to Cardiac Electrophysiology   EKG 12-Lead     No orders of the defined types were placed in this encounter.     Patient Instructions  Medication Instructions:   Your physician recommends that you continue on your  current medications as directed. Please refer to the Current Medication list given to you today.  *If you need a refill on your cardiac medications before your next appointment, please call your pharmacy*   Lab Work:  CBC, BMP drawn in office today.   Testing/Procedures:  You are scheduled for a Cardioversion on ___Wednesday 2/8/23_____________ with Dr.__Agbor-Etang_________ Please arrive at the [place] [facility] at __0630_______ a.m. on the day of your procedure.  DIET INSTRUCTIONS:   Nothing to eat or drink after midnight except your medications with a sip of water.         Labs: __Drawn in office 2/6/23________________  Medications:  YOU MAY TAKE ALL of your remaining medications with a small amount of water.  Must have a responsible person to drive you home.  Bring a current list of your medications and current insurance cards.    If you have any questions after you get home, please call the office at [phone]    Follow-Up: At [facility], you and your health needs are our priority.  As part of our continuing mission to provide you with exceptional heart care, we have created designated Provider Care Teams.  These Care Teams include your primary Cardiologist (physician) and Advanced Practice Providers (APPs -  Physician Assistants and Nurse Practitioners) who all work together to provide you with the care you need, when you need it.  We recommend signing up for the patient portal called "MyChart".  Sign up information is provided on this After Visit Summary.  MyChart is used to connect with patients for Virtual Visits (Telemedicine).  Patients are able to view lab/test results, encounter notes, upcoming appointments, etc.  Non-urgent messages can be sent to your provider as well.   To learn more about what you can do with MyChart, go to [email or web address].    Your next appointment:   4-6 week(s)  The format for your next appointment:   In Person  Provider:     ONLY WITH [name], MD    Other Instructions     Signed, [name], MD  07/31/2021 11:56 AM    [place]

## 2021-08-01 ENCOUNTER — Ambulatory Visit: Payer: Medicare PPO | Admitting: Occupational Therapy

## 2021-08-01 ENCOUNTER — Telehealth: Payer: Self-pay | Admitting: Cardiology

## 2021-08-01 ENCOUNTER — Ambulatory Visit: Payer: Medicare PPO

## 2021-08-01 LAB — BASIC METABOLIC PANEL
BUN/Creatinine Ratio: 8 — ABNORMAL LOW (ref 10–24)
BUN: 9 mg/dL (ref 8–27)
CO2: 22 mmol/L (ref 20–29)
Calcium: 9.4 mg/dL (ref 8.6–10.2)
Chloride: 107 mmol/L — ABNORMAL HIGH (ref 96–106)
Creatinine, Ser: 1.08 mg/dL (ref 0.76–1.27)
Glucose: 99 mg/dL (ref 70–99)
Potassium: 4.4 mmol/L (ref 3.5–5.2)
Sodium: 143 mmol/L (ref 134–144)
eGFR: 76 mL/min/{1.73_m2} (ref >59)

## 2021-08-01 LAB — CBC
Hematocrit: 44.6 % (ref 37.5–51.0)
Hemoglobin: 15.6 g/dL (ref 13.0–17.7)
MCH: 30.7 pg (ref 26.6–33.0)
MCHC: 35 g/dL (ref 31.5–35.7)
MCV: 88 fL (ref 79–97)
Platelets: 216 10*3/uL (ref 150–450)
RBC: 5.08 x10E6/uL (ref 4.14–5.80)
RDW: 12.7 % (ref 11.6–15.4)
WBC: 8.7 10*3/uL (ref 3.4–10.8)

## 2021-08-01 NOTE — Telephone Encounter (Signed)
Patient calling to cancel cardioversion tomorrow morning States he does not have transportation Please call to reschedule

## 2021-08-01 NOTE — Telephone Encounter (Signed)
Called patient to inform him that I cancelled his DDCV for tomorrow and to reschedule the procedure for him, however I was unable to leave a VM.  Will attempt to reach him again tomorrow.

## 2021-08-02 ENCOUNTER — Ambulatory Visit: Admission: RE | Admit: 2021-08-02 | Payer: Medicare PPO | Source: Home / Self Care | Admitting: Cardiology

## 2021-08-02 ENCOUNTER — Encounter: Admission: RE | Payer: Self-pay | Source: Home / Self Care

## 2021-08-02 SURGERY — CARDIOVERSION
Anesthesia: General

## 2021-08-03 ENCOUNTER — Other Ambulatory Visit: Payer: Self-pay

## 2021-08-03 ENCOUNTER — Ambulatory Visit: Payer: Medicare PPO | Admitting: Occupational Therapy

## 2021-08-03 ENCOUNTER — Ambulatory Visit: Payer: Medicare PPO | Attending: Neurology

## 2021-08-03 DIAGNOSIS — I890 Lymphedema, not elsewhere classified: Secondary | ICD-10-CM | POA: Insufficient documentation

## 2021-08-03 DIAGNOSIS — R262 Difficulty in walking, not elsewhere classified: Secondary | ICD-10-CM | POA: Diagnosis not present

## 2021-08-03 DIAGNOSIS — R269 Unspecified abnormalities of gait and mobility: Secondary | ICD-10-CM | POA: Insufficient documentation

## 2021-08-03 DIAGNOSIS — R2681 Unsteadiness on feet: Secondary | ICD-10-CM | POA: Diagnosis not present

## 2021-08-03 DIAGNOSIS — M6281 Muscle weakness (generalized): Secondary | ICD-10-CM | POA: Insufficient documentation

## 2021-08-03 NOTE — Therapy (Signed)
[place] MAIN [facility] SERVICES [address] [place], [place], [address] Phone: [phone]   Fax:  [phone]  Physical Therapy Treatment  Patient Details  Name: Dustin Gray MRN: [id number] Date of Birth: 1956-03-26 No data recorded  Encounter Date: 08/03/2021   PT End of Session - 08/03/21 0816     Visit Number 35    Number of Visits 43    Date for PT Re-Evaluation 10/11/21    Authorization Type [employer]    Authorization Time Period 01/31/21-04/25/21    PT Start Time 1302    PT Stop Time 1344    PT Time Calculation (min) 42 min    Equipment Utilized During Treatment Gait belt    Activity Tolerance Patient tolerated treatment well;No increased pain;Patient limited by fatigue    Behavior During Therapy [name] for tasks assessed/performed             Past Medical History:  Diagnosis Date   Abscess    groin   Arthritis    lower spine   Erectile dysfunction    Low testosterone    Lumbar herniated disc    L5   Multiple sclerosis (HCC)    Staph aureus infection     Past Surgical History:  Procedure Laterality Date   COLONOSCOPY WITH PROPOFOL N/A 07/04/2016   Procedure: COLONOSCOPY WITH PROPOFOL;  Surgeon: [name], MD;  Location: [place];  Service: Endoscopy;  Laterality: N/A;   [place]   POLYPECTOMY  07/04/2016   Procedure: POLYPECTOMY;  Surgeon: [name], MD;  Location: [place];  Service: Endoscopy;;   TONSILLECTOMY      There were no vitals filed for this visit.   Subjective Assessment - 08/03/21 0814     Subjective Patient reports he thinks his his heart issues (a-fib) have been more stress related due to some personal issues going on in his life. He reports he had to cancel cardioversion procedure earlier this week and has to reschedule. He denies any current pain.    Pertinent History Patient presents to physical therapy for unsteadiness, walking  instability, fatigue. His PMH includes paroxysmal a fib, MS, lymphedema, hypothyroidism, Graves Disease, arthritis, lumbar herniated disc (L5), depression and neuropathy. Patient first developed symptoms of MS in 2005 and was diagnosed in 2006. Patient has done PT in the past, but hasn't been seen since 2020 at [facility]. Has not been doing his exercises in the past year. Walk outside to garage to ride lawnmower, walks in house. 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 a couple hours    How long can you stand comfortably? no more than 10 minutes before pain, have to steady self immediately    How long can you walk comfortably? has to use rollator. can walk in a store with a cart    Patient Stated Goals want to get stronger and feel mor comfortable walking around. Patient would like to continue strengthening hips and improve endurance and ability to pace self for safety while walking.    Currently in Pain? No/denies            INTERVENTIONS:   By support bar: CGA and use of gait belt unless otherwise stated.  Neuro re-ed   -Dynamic marching (high knees) without UE support (mild unsteadiness yet  no LOB)  -Hangman x 2 games: 1st game- Patient performed with narrowed base of support on airex pad. 2nd game with feet staggered- Mild unsteadiness yet no LOB.  -Arranging letters on dry erase board in alphabetical order- while standing on 1/2 foam roll (curve side up)  - Increased A/P sway- mostly using ankle righting reaction strategy- 2-3 LOB (posterior)     TherEx   -high knee march full length of // bars  -Hamstring curl with B upper extremity support, cueing for knee alignment for recruitment of hamstring musculature, 10x each LE -Heel raises from 1/2 foam x 10 reps - Attempted toe raises from foam yet max difficulty- minimal ability on firm surface -  up to 10 reps  Pt educated throughout session about proper posture and technique with exercises. Improved exercise technique, movement at target joints, use of target muscles after min to mod verbal, visual, tactile cues.  Education provided throughout session via VC/TC and demonstration to facilitate movement at target joints and correct muscle activation for all testing and exercises performed.                        PT Education - 08/04/21 0816     Education Details Exercise technique    Person(s) Educated Patient    Methods Explanation;Demonstration;Verbal cues    Comprehension Verbalized understanding;Returned demonstration;Verbal cues required;Need further instruction              PT Short Term Goals - 07/19/21 1603       PT SHORT TERM GOAL #1   Title Patient will be independent in home exercise program to improve strength/mobility for better functional independence with ADLs.    Baseline 8/9: HEP to be given next session, 10/31: patient reports compliance with HEP and would like progressions added next session.    Time 4    Period Weeks    Status On-going    Target Date 08/16/21               PT Long Term Goals - 07/19/21 1603       PT LONG TERM GOAL #1   Title Patient will increase FOTO score to equal to or greater than 70%    to demonstrate statistically significant improvement in mobility and quality of life.    Baseline 8/9: 64%; risk adjusted 34%; 03/21/21 FOTO: 45; 04/24/21 FOTO: 49%    Time 12    Period Weeks    Status Partially Met    Target Date 10/11/21      PT LONG TERM GOAL #2   Title Patient will increase Berg Balance score by > 6 points to demonstrate decreased fall risk during functional activities.    Baseline 8/9: BBT 29/56; 03/21/21: 42/56    Time 12    Period Weeks    Status Achieved      PT LONG TERM GOAL #3   Title Patient will increase 10 meter walk test to >1.51ms as to improve gait speed for better community  ambulation and to reduce fall risk.    Baseline 8/9: 0.65 m/s with rollator; 10/31: 0.68 m/s with rollator. 07/06/2021= 0.71 m/s using rollator    Time 12    Period Weeks    Status On-going    Target Date 10/11/21      PT LONG TERM GOAL #4   Title Patient will increase six minute walk test distance to >1000 for progression to community ambulator and improve gait ability  Baseline 8/9: 505 ft with rollator; 03/21/21: 743f c rollator, 04/24/21: 7539fc rollator; 07/06/2021=900 feet    Time 12    Period Weeks    Status On-going    Target Date 10/11/21      PT LONG TERM GOAL #5   Title Patient will increase glute medius strength on Left LE from 3-/5 to 4/5 to improve stability, gait mechanics, and functional strength.    Baseline 10/31: 3-/5; 07/06/2021= 3-/5    Time 12    Period Weeks    Status On-going    Target Date 10/11/21      PT LONG TERM GOAL #6   Title Patient will increase Left single leg stance time to 15 seconds or greater to increase safety in shower and independence with ADLs.    Baseline 10/31; 1.83 sec without UE support    Time 12    Period Weeks    Status New    Target Date 10/11/21                   Plan - 08/03/21 0817     Clinical Impression Statement Patient presented with excellent motivation for today's session. He performed well - adhering to instructions and returning demonstration for all therex and balance activities well. He adapted well to ankle/hip strategies while performing dual tasks with minimal unsteadiness noted. Toe raises continue to be limited bilateral and well as fatigue with left LE in general with exercises. Patient will continue to benefit from skilled physical therapy intervention in order to improve his strength, ambulatory capacity, balance, and increase his overall function and safety with everyday ambulation    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    Rehab Potential Fair    PT Frequency 2x / week    PT Duration 12 weeks    PT Treatment/Interventions ADLs/Self Care Home Management;Aquatic Therapy;Canalith Repostioning;Biofeedback;Cryotherapy;Ultrasound;Traction;Moist Heat;Electrical Stimulation;DME Instruction;Gait training;Therapeutic exercise;Therapeutic activities;Functional mobility training;Stair training;Balance training;Neuromuscular re-education;Patient/family education;Manual techniques;Orthotic Fit/Training;Compression bandaging;Passive range of motion;Vestibular;Taping;Splinting;Energy conservation;Dry needling;Visual/perceptual remediation/compensation    PT Next Visit Plan Continue with progessive balance training, Progressive LE strenthening as appropriate.    PT Home Exercise Plan Access Code: [email or web address]: [email or web address]  Added [place] with BTB and encouraged more walking around the home during commerical breaks when watching TV.    Consulted and Agree with Plan of Care Patient             Patient will benefit from skilled therapeutic intervention in order to improve the following deficits and impairments:  Abnormal gait, Cardiopulmonary status limiting activity, Decreased activity tolerance, Decreased coordination, Decreased balance, Decreased endurance, Decreased mobility, Decreased strength, Decreased range of motion, Difficulty walking, Impaired flexibility, Impaired  sensation, Postural dysfunction, Improper body mechanics  Visit Diagnosis: Difficulty in walking, not elsewhere classified  Abnormality of gait and mobility  Muscle weakness (generalized)  Unsteadiness on feet     Problem List Patient Active Problem List   Diagnosis Date Noted   Persistent atrial fibrillation (HCC)    Graves disease    Abdominal bloating    Atrial fibrillation with RVR ([facility]/01/2020   Hyperthyroidism    Atrial fibrillation with rapid ventricular response ([facility]04/12/2019   Leg edema  Left leg cellulitis    Special screening for malignant neoplasms, colon    Polyp of sigmoid colon    Benign neoplasm of ascending colon    Rectal polyp    Herniated nucleus pulposus, L5-S1 11/09/2015   Low back pain 10/25/2015   Herniated nucleus pulposus, C5-6 right 10/06/2015   Dysesthesia 09/16/2015   Spasticity 09/16/2015   Unsteady gait 09/16/2015   Multiple sclerosis ([place]) 06/08/2011    Lewis Moccasin, PT 08/04/2021, 8:24 AM  [place] MAIN [facility] SERVICES [address] [place], [place], [address] Phone: [phone]   Fax:  [phone]  Name: Dustin Gray MRN: [id number] Date of Birth: 07-18-1955

## 2021-08-03 NOTE — Therapy (Signed)
[place] MAIN [facility] SERVICES [address] [place], [place], [address] Phone: [phone]   Fax:  [phone]  Occupational Therapy Treatment  Patient Details  Name: Dustin Gray MRN: [id number] Date of Birth: 03-27-1956 Referring Provider (OT): Staci Acosta, MD   Encounter Date: 08/03/2021   OT End of Session - 08/03/21 1535     Visit Number 8    Number of Visits 36    Date for OT Re-Evaluation 08/23/21    OT Start Time 0205    OT Stop Time 0305    OT Time Calculation (min) 60 min    Activity Tolerance Patient tolerated treatment well;No increased pain    Behavior During Therapy WFL for tasks assessed/performed             Past Medical History:  Diagnosis Date   Abscess    groin   Arthritis    lower spine   Erectile dysfunction    Low testosterone    Lumbar herniated disc    L5   Multiple sclerosis (HCC)    Staph aureus infection     Past Surgical History:  Procedure Laterality Date   COLONOSCOPY WITH PROPOFOL N/A 07/04/2016   Procedure: COLONOSCOPY WITH PROPOFOL;  Surgeon: [name], MD;  Location: [place];  Service: Endoscopy;  Laterality: N/A;   [place]   POLYPECTOMY  07/04/2016   Procedure: POLYPECTOMY;  Surgeon: [name], MD;  Location: [place];  Service: Endoscopy;;   TONSILLECTOMY      There were no vitals filed for this visit.   Subjective Assessment - 08/03/21 1536     Subjective  Dustin Gray presents for OT visit 8/36 to address BLE lymphedema. Pt reports he missed last 2 OT visits due to episode of Afib. Pt brings wraps to clinic. He is wearing loaned, off the shelf,  ccl 2 compression stocking on LLE, which is quite swollen today.    Pertinent History relevant to LE: HTN, MS, Herniated C-5-C6, L5-S1, chronic low back pain, persistent Afib,Hx LLE cellulitis, Graves disease, HYPERthyroidism, OA    Limitations difficulty walking,  impaired functional mobility and transfers, impaired balance, muscle weakness, L>R,, altered sensation, chronic back pain , chronic leg swelling and associated pain, spasticity in legs    Repetition Increases Symptoms    Special Tests +Stemmer sign, L>R; Intake FOTO: 51/100    Patient Stated Goals Learn about lymphedema and explore treatment options    Pain Onset More than a month ago    Pain Onset More than a month ago                          OT Treatments/Exercises (OP) - 08/03/21 1544       ADLs   ADL Education Given Yes      Manual Therapy   Manual Therapy Edema management;Compression Bandaging    Manual Lymphatic Drainage (MLD) MLD to LLE/LLQ utilizing short neck sequence, diaphragmatic breathing, functional inguinal LN and proximal nto distal J strokes to each lower extremity segment. Fially repeat 3 retrograde sweeps to terminus and repeat short neck.    Compression Bandaging 4 layer knee length compression wrap , including foot , from base of toes to popliteal fossa using stockinett as base layer, then single layer Rosidal foam under 1 each 8,10 and 12 cm wide short stretch bandages.  OT Education - 08/03/21 1544     Education Details Continued Pt/ CG edu for lymphedema self care  and home program throughout session. Topics include multilayer, gradient compression wrapping, simple self-MLD, therapeutic lymphatic pumping exercises, skin/nail care, risk reduction factors and LE precautions, compression garments/recommendations and wear and care schedule and compression garment donning / doffing using assistive devices. All questions answered to the Pt's satisfaction, and Pt demonstrates understanding by report.    Person(s) Educated Patient    Methods Explanation;Demonstration;Handout    Comprehension Verbalized understanding;Returned demonstration;Need further instruction                 OT Long Term Goals - 05/28/21 1820        OT LONG TERM GOAL #1   Title Given this patients Intake score of 51/100 on the functional outcomes FOTO tool, patient will experience an increase in function of 5 points to improve basic and instrumental ADLs performance, including lymphedema self-care.    Baseline Max A    Time 12    Period Weeks    Status New      OT LONG TERM GOAL #2   Title Pt will be able to apply knee length, multi-layer, short stretch compression wraps to one limb at a time using gradient techniques with MAX CG ASSISTANCE to decrease limb volume, to limit infection risk, and to limit lymphedema progression.    Baseline Dependent    Time 4    Period Days    Status New      OT LONG TERM GOAL #3   Title lymphedema self-care.  Pt will demonstrate understanding of lymphedema prevention strategies by identifying and discussing 5 precautions using printed reference (modified assistance) to reduce risk of progression and to limit infection risk.    Baseline Max A    Time 4    Period Days    Status New    Target Date --   4th OT Rx visit     OT LONG TERM GOAL #4   Title Pt will achieve at least a 10% limb volume reduction in bilateral legs to return limb to normal size and shape,  to limit lymphedema progression and to limit infection risk.    Baseline BLE comparative limb volumetrics TBA at 1st OT Rx visit. BY visual assessment both legs swollen by 10-15%, L>R    Time 12    Period Weeks    Status New    Target Date 08/23/21      OT LONG TERM GOAL #5   Title With MAX CG ASSISTANCE Pt will achieve and sustain a least 85% compliance with all 4 LE self-care home program components throughout Intensive Phase CDT, including modified simple self-MLD, daily skin inspection and care, lymphatic pumping the ex, 23/7 compression wraps to sustain clinical gains made in CDT and to limit lymphedema progression and further functional decline.    Baseline Dependent    Time 12    Period Weeks    Status New    Target Date  08/23/21                   Plan - 08/03/21 1538     Clinical Impression Statement Apparently loand, off the shelf, ccl 2 (30-40 mmHg) Juzo DYNAMIC, knee length, circular knit compression stocking did not contain Pt's LLE lymphedema, or it is possible that compliance with compression over past week may have declined due to personal problems at home.We completed a [place] application and submitted it  in prep for ordering BLE custom Elvarex knee highs with increased containment at similar levcel of compression. After resuming wrapping for a visit or so we'll, hopefully, be able to complete garment measurements for the LLE. Provided MLD today as established to LLE/LLQ and reapplied multilayer wraps. Cont 2 x weekly CDT as per POC.    OT Occupational Profile and History Comprehensive Assessment- Review of records and extensive additional review of physical, cognitive, psychosocial history related to current functional performance    Occupational performance deficits (Please refer to evaluation for details): ADL's;IADL's;Work;Leisure;Social Participation;Other   role performance   Body Structure / Function / Physical Skills ADL;Edema;Skin integrity;Flexibility;Pain;ROM;Decreased knowledge of use of DME;Scar mobility;IADL    Rehab Potential Good    Clinical Decision Making Several treatment options, min-mod task modification necessary    Comorbidities Affecting Occupational Performance: Presence of comorbidities impacting occupational performance    Modification or Assistance to Complete Evaluation  Min-Moderate modification of tasks or assist with assess necessary to complete eval    OT Frequency 2x / week    OT Duration 12 weeks   and PRN for follow along   OT Treatment/Interventions Self-care/ADL training;Therapeutic exercise;Manual Therapy;Coping strategies training;Therapeutic activities;Manual lymph drainage;DME and/or AE instruction;Compression bandaging;Other  (comment);Patient/family education   skin care to limit infection risk   Plan Complete Decongestive Therapy (CDT) One leg at a time to limit fall LLE first. Manual lymphatic drainage (MLD), skin care, ther ex, compression wraps, then    OT Home Exercise Plan Pt verbalized understanding that he requires assistance with all lymphedema home care components, especially compression wrapping, between visits for optimal prognosis. Without assistance prognosis is poor    Recommended Other Services In insurance benefits available, garmentsConsider advanced sequential pneumatic compression device (Flexitouch) to maximize independence w lymphedema self-care at home over time. Pt unable to perform simple self-mld    Consulted and Agree with Plan of Care Patient             Patient will benefit from skilled therapeutic intervention in order to improve the following deficits and impairments:   Body Structure / Function / Physical Skills: ADL, Edema, Skin integrity, Flexibility, Pain, ROM, Decreased knowledge of use of DME, Scar mobility, IADL       Visit Diagnosis: Lymphedema, not elsewhere classified    Problem List Patient Active Problem List   Diagnosis Date Noted   Persistent atrial fibrillation (HCC)    Graves disease    Abdominal bloating    Atrial fibrillation with RVR ([place]) 10/01/2019   Hyperthyroidism    Atrial fibrillation with rapid ventricular response ([place]) 09/30/2019   Leg edema    Left leg cellulitis    Special screening for malignant neoplasms, colon    Polyp of sigmoid colon    Benign neoplasm of ascending colon    Rectal polyp    Herniated nucleus pulposus, L5-S1 11/09/2015   Low back pain 10/25/2015   Herniated nucleus pulposus, C5-6 right 10/06/2015   Dysesthesia 09/16/2015   Spasticity 09/16/2015   Unsteady gait 09/16/2015   Multiple sclerosis ([place]) 06/08/2011    Andrey Spearman, MS, OTR/L, CLT-LANA 08/03/21 3:45 PM   [place] MAIN [facility] SERVICES [address] [place], [place], [address] Phone: [phone]   Fax:  [phone]  Name: Dustin Gray MRN: [id number] Date of Birth: [birth year]

## 2021-08-08 ENCOUNTER — Ambulatory Visit: Payer: Medicare PPO

## 2021-08-08 ENCOUNTER — Other Ambulatory Visit: Payer: Self-pay

## 2021-08-08 ENCOUNTER — Ambulatory Visit: Payer: Medicare PPO | Admitting: Occupational Therapy

## 2021-08-08 DIAGNOSIS — R2681 Unsteadiness on feet: Secondary | ICD-10-CM | POA: Diagnosis not present

## 2021-08-08 DIAGNOSIS — R269 Unspecified abnormalities of gait and mobility: Secondary | ICD-10-CM

## 2021-08-08 DIAGNOSIS — R262 Difficulty in walking, not elsewhere classified: Secondary | ICD-10-CM | POA: Diagnosis not present

## 2021-08-08 DIAGNOSIS — M6281 Muscle weakness (generalized): Secondary | ICD-10-CM

## 2021-08-08 DIAGNOSIS — I890 Lymphedema, not elsewhere classified: Secondary | ICD-10-CM

## 2021-08-08 NOTE — Patient Instructions (Signed)

## 2021-08-08 NOTE — Therapy (Signed)
[place] MAIN [facility] SERVICES [address] [place], [place], [address] Phone: [phone]   Fax:  [phone]  Physical Therapy Treatment  Patient Details  Name: Dustin Gray MRN: [id number] Date of Birth: 09-Nov-1955 No data recorded  Encounter Date: 08/08/2021   PT End of Session - 08/08/21 1345     Visit Number 36    Number of Visits 43    Date for PT Re-Evaluation 10/11/21    Authorization Type [employer]    Authorization Time Period 01/31/21-04/25/21    PT Start Time 1304    PT Stop Time 1343    PT Time Calculation (min) 39 min    Equipment Utilized During Treatment Gait belt    Activity Tolerance Patient tolerated treatment well;No increased pain;Patient limited by fatigue    Behavior During Therapy [facility] for tasks assessed/performed             Past Medical History:  Diagnosis Date   Abscess    groin   Arthritis    lower spine   Erectile dysfunction    Low testosterone    Lumbar herniated disc    L5   Multiple sclerosis (HCC)    Staph aureus infection     Past Surgical History:  Procedure Laterality Date   COLONOSCOPY WITH PROPOFOL N/A 07/04/2016   Procedure: COLONOSCOPY WITH PROPOFOL;  Surgeon: [name], MD;  Location: [place];  Service: Endoscopy;  Laterality: N/A;   [place]   POLYPECTOMY  07/04/2016   Procedure: POLYPECTOMY;  Surgeon: [name], MD;  Location: [place];  Service: Endoscopy;;   TONSILLECTOMY      There were no vitals filed for this visit.   Subjective Assessment - 08/08/21 1313     Subjective Patient had to reschedule his cardioversion due to not having a ride. Continuing to have A-fib.    Pertinent History Patient presents to physical therapy for unsteadiness, walking instability, fatigue. His PMH includes paroxysmal a fib, MS, lymphedema, hypothyroidism, Graves Disease, arthritis, lumbar herniated disc (L5), depression and  neuropathy. Patient first developed symptoms of MS in 2005 and was diagnosed in 2006. Patient has done PT in the past, but hasn't been seen since 2020 at [facility]. Has not been doing his exercises in the past year. Walk outside to garage to ride lawnmower, walks in house. 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 a couple hours    How long can you stand comfortably? no more than 10 minutes before pain, have to steady self immediately    How long can you walk comfortably? has to use rollator. can walk in a store with a cart    Patient Stated Goals want to get stronger and feel mor comfortable walking around. Patient would like to continue strengthening hips and improve endurance and ability to pace self for safety while walking.    Currently in Pain? No/denies             BP at start of session: 116/83    INTERVENTIONS:    By support bar: CGA and use of gait belt unless otherwise stated.   Neuro re-ed   -Hangman x 2 games: 1st game- Patient performed with narrowed base of support on airex pad. 2nd game with feet staggered- Mild unsteadiness yet no LOB.  Airex pad 4" step modified tandem stance 2x 30 seconds each LE   Weight shift onto LLE with 4" step under RLE no UE support x8 minutes with additional word game   TherEx  -Dynamic marching (high knees) with UE support (mild unsteadiness yet no LOB) 10x each LE   -Hamstring curl with B upper extremity support, cueing for knee alignment for recruitment of hamstring musculature, 10x each LE  -hip abduction 12x each LE BUE support  -squat with BUE support with chair behind for depth cue 12x  -Heel raises 15x  Pt educated throughout session about proper posture and technique with exercises. Improved exercise technique, movement at target joints, use of target muscles after min to mod verbal,  visual, tactile cues.       vitals monitored throughout session due to A fib history to ensure therapeutic range.    Patient tolerates physical therapy session well despite recent issue with A fib. His vitals required monitoring throughout physical therapy session to ensure therapeutic range. Weight shift improving on affected LLE. Patient will continue to benefit from skilled physical therapy intervention in order to improve his strength, ambulatory capacity, balance, and increase his overall function and safety with everyday ambulation                   PT Education - 08/08/21 1345     Education Details exercise technique, body mechanics    Person(s) Educated Patient    Methods Explanation;Demonstration;Tactile cues;Verbal cues    Comprehension Verbalized understanding;Returned demonstration;Verbal cues required;Tactile cues required              PT Short Term Goals - 07/19/21 1603       PT SHORT TERM GOAL #1   Title Patient will be independent in home exercise program to improve strength/mobility for better functional independence with ADLs.    Baseline 8/9: HEP to be given next session, 10/31: patient reports compliance with HEP and would like progressions added next session.    Time 4    Period Weeks    Status On-going    Target Date 08/16/21               PT Long Term Goals - 07/19/21 1603       PT LONG TERM GOAL #1   Title Patient will increase FOTO score to equal to or greater than 70%    to demonstrate statistically significant improvement in mobility and quality of life.    Baseline 8/9: 64%; risk adjusted 34%; 03/21/21 FOTO: 45; 04/24/21 FOTO: 49%    Time 12    Period Weeks    Status Partially Met    Target Date 10/11/21      PT LONG TERM GOAL #2   Title Patient will increase Berg Balance score by > 6 points to demonstrate decreased fall risk during functional activities.    Baseline 8/9: BBT 29/56; 03/21/21: 42/56    Time 12    Period  Weeks    Status Achieved      PT LONG TERM GOAL #3   Title Patient will increase 10 meter walk test to >1.30ms as to improve gait speed for better community ambulation and to reduce fall risk.    Baseline 8/9: 0.65 m/s with rollator; 10/31: 0.68 m/s with rollator. 07/06/2021= 0.71 m/s using rollator    Time 12    Period Weeks    Status On-going    Target Date 10/11/21      PT LONG TERM  GOAL #4   Title Patient will increase six minute walk test distance to >1000 for progression to community ambulator and improve gait ability    Baseline 8/9: 505 ft with rollator; 03/21/21: 783f c rollator, 04/24/21: [id number] rollator; 07/06/2021=900 feet    Time 12    Period Weeks    Status On-going    Target Date 10/11/21      PT LONG TERM GOAL #5   Title Patient will increase glute medius strength on Left LE from 3-/5 to 4/5 to improve stability, gait mechanics, and functional strength.    Baseline 10/31: 3-/5; 07/06/2021= 3-/5    Time 12    Period Weeks    Status On-going    Target Date 10/11/21      PT LONG TERM GOAL #6   Title Patient will increase Left single leg stance time to 15 seconds or greater to increase safety in shower and independence with ADLs.    Baseline 10/31; 1.83 sec without UE support    Time 12    Period Weeks    Status New    Target Date 10/11/21                   Plan - 08/08/21 1349     Clinical Impression Statement Patient tolerates physical therapy session well despite recent issue with A fib. His vitals required monitoring throughout physical therapy session to ensure therapeutic range. Weight shift improving on affected LLE. Patient will continue to benefit from skilled physical therapy intervention in order to improve his strength, ambulatory capacity, balance, and increase his overall function and safety with everyday ambulation    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    Rehab Potential Fair    PT Frequency 2x / week    PT Duration 12 weeks    PT Treatment/Interventions ADLs/Self Care Home Management;Aquatic Therapy;Canalith Repostioning;Biofeedback;Cryotherapy;Ultrasound;Traction;Moist Heat;Electrical Stimulation;DME Instruction;Gait training;Therapeutic exercise;Therapeutic activities;Functional mobility training;Stair training;Balance training;Neuromuscular re-education;Patient/family education;Manual techniques;Orthotic Fit/Training;Compression bandaging;Passive range of motion;Vestibular;Taping;Splinting;Energy conservation;Dry needling;Visual/perceptual remediation/compensation    PT Next Visit Plan Continue with progessive balance training, Progressive LE strenthening as appropriate.    PT Home Exercise Plan Access Code: [email or web address]: [email or web address]  Added [place] with BTB and encouraged more walking around the home during commerical breaks when watching TV.    Consulted and Agree with Plan of Care Patient             Patient will benefit from skilled therapeutic intervention in order to improve the following deficits and impairments:  Abnormal gait, Cardiopulmonary status limiting activity, Decreased activity tolerance, Decreased coordination, Decreased balance, Decreased endurance, Decreased mobility, Decreased strength, Decreased range of motion, Difficulty walking, Impaired flexibility, Impaired  sensation, Postural dysfunction, Improper body mechanics  Visit Diagnosis: Difficulty in walking, not elsewhere classified  Abnormality of gait and mobility  Muscle weakness (generalized)  Unsteadiness on feet     Problem List Patient Active Problem List   Diagnosis Date Noted   Persistent atrial fibrillation (HCC)    Graves disease    Abdominal bloating    Atrial fibrillation with RVR ([place]04/01/2020   Hyperthyroidism    Atrial fibrillation with rapid ventricular response ([facility]04/12/2019   Leg edema  Left leg cellulitis    Special screening for malignant neoplasms, colon    Polyp of sigmoid colon    Benign neoplasm of ascending colon    Rectal polyp    Herniated nucleus pulposus, L5-S1 11/09/2015   Low back pain 10/25/2015   Herniated nucleus pulposus, C5-6 right 10/06/2015   Dysesthesia 09/16/2015   Spasticity 09/16/2015   Unsteady gait 09/16/2015   Multiple sclerosis ([place]) 06/08/2011    Janna Arch, PT, DPT  08/08/2021, 1:50 PM  [place] MAIN [facility] SERVICES [address] [place], [place], [address] Phone: [phone]   Fax:  [phone]  Name: Dustin Gray MRN: [id number] Date of Birth: [birth year]

## 2021-08-09 NOTE — Telephone Encounter (Signed)
Called patient to try and reschedule his DCCV and was unable to leave a VM. Received a message stating that his VM box was full.

## 2021-08-10 ENCOUNTER — Other Ambulatory Visit: Payer: Self-pay

## 2021-08-10 ENCOUNTER — Ambulatory Visit: Payer: Medicare PPO | Admitting: Occupational Therapy

## 2021-08-10 ENCOUNTER — Ambulatory Visit: Payer: Medicare PPO

## 2021-08-10 DIAGNOSIS — R2681 Unsteadiness on feet: Secondary | ICD-10-CM | POA: Diagnosis not present

## 2021-08-10 DIAGNOSIS — I890 Lymphedema, not elsewhere classified: Secondary | ICD-10-CM | POA: Diagnosis not present

## 2021-08-10 DIAGNOSIS — M6281 Muscle weakness (generalized): Secondary | ICD-10-CM | POA: Diagnosis not present

## 2021-08-10 DIAGNOSIS — R269 Unspecified abnormalities of gait and mobility: Secondary | ICD-10-CM | POA: Diagnosis not present

## 2021-08-10 DIAGNOSIS — R262 Difficulty in walking, not elsewhere classified: Secondary | ICD-10-CM | POA: Diagnosis not present

## 2021-08-10 NOTE — Patient Instructions (Signed)

## 2021-08-10 NOTE — Therapy (Signed)
[name] [facility] SERVICES [address] [place], [place], [address] Phone: [phone]   Fax:  [phone]  Occupational Therapy Treatment Note and Progress Report: Lymphedema Care  Patient Details  Name: Dustin Gray MRN: [id number] Date of Birth: [birth year] Referring Provider (OT): Staci Acosta, MD   Encounter Date: 08/10/2021   OT End of Session - 08/10/21 1318     Visit Number 10    Number of Visits 36    Date for OT Re-Evaluation 08/23/21    OT Start Time 0103    OT Stop Time 0205    OT Time Calculation (min) 62 min    Activity Tolerance Patient tolerated treatment well;No increased pain    Behavior During Therapy WFL for tasks assessed/performed             Past Medical History:  Diagnosis Date   Abscess    groin   Arthritis    lower spine   Erectile dysfunction    Low testosterone    Lumbar herniated disc    L5   Multiple sclerosis (HCC)    Staph aureus infection     Past Surgical History:  Procedure Laterality Date   COLONOSCOPY WITH PROPOFOL N/A 07/04/2016   Procedure: COLONOSCOPY WITH PROPOFOL;  Surgeon: [name], MD;  Location: [place];  Service: Endoscopy;  Laterality: N/A;   [place]   POLYPECTOMY  07/04/2016   Procedure: POLYPECTOMY;  Surgeon: [name], MD;  Location: [place];  Service: Endoscopy;;   TONSILLECTOMY      There were no vitals filed for this visit.   Subjective Assessment - 08/10/21 1549     Subjective  Dustin Gray presents for OT visit 10/36 to address BLE lymphedema. Pt reports his stress level is much reduced today. Pt arroves with LLE compression wraps in place. He denies LE-related leg pain.    Pertinent History relevant to LE: HTN, MS, Herniated C-5-C6, L5-S1, chronic low back pain, persistent Afib,Hx LLE cellulitis, Graves disease, HYPERthyroidism, OA    Limitations difficulty walking, impaired  functional mobility and transfers, impaired balance, muscle weakness, L>R,, altered sensation, chronic back pain , chronic leg swelling and associated pain, spasticity in legs    Repetition Increases Symptoms    Special Tests +Stemmer sign, L>R; Intake FOTO: 51/100    Patient Stated Goals Learn about lymphedema and explore treatment options    Currently in Pain? No/denies    Pain Onset More than a month ago    Pain Onset More than a month ago                          OT Treatments/Exercises (OP) - 08/10/21 1551       ADLs   ADL Education Given Yes      Manual Therapy   Manual Therapy Edema management;Compression Bandaging    Manual therapy comments 9th visit FOTO 53/100    Edema Management Reviewed progress towards goals    Compression Bandaging 4 layer knee length compression wrap , including foot , from base of toes to popliteal fossa using stockinett as base layer, then single layer Rosidal foam under 1 each 8,10 and 12 cm wide short stretch bandages.                    OT Education - 08/10/21 1552     Education Details After skilled teaching  and practice in clinic Pt able to perform  diaphragmatic breathing and short neck sequence with J stroke mastered.   Discussed progress towards all OT goals for LE care in prep for progress note on visit 10.  Discssed custom compression garment options, measuring and fitting process, orderiing and fitting processes. Discussed custom vs circular knit recommendations as loaned [place] 2 garment did not cntain Pt adequately to control lymphedema and keep it from progressing.    Person(s) Educated Patient    Methods Explanation;Demonstration;Handout    Comprehension Verbalized understanding;Returned demonstration;Need further instruction                 OT Long Term Goals - 08/10/21 1319       OT LONG TERM GOAL #1   Title Given this patients Intake score of 51/100 on the functional outcomes FOTO tool,  patient will experience an increase in function of 5 points to improve basic and instrumental ADLs performance, including lymphedema self-care.    Baseline Max A    Time 12    Period Weeks    Status Partially Met   08/08/21 ( OT visit 9) increased 2 points tfrom 51/100 initially to 53100 today.     OT LONG TERM GOAL #2   Title Pt will be able to apply knee length, multi-layer, short stretch compression wraps to one limb at a time using gradient techniques with MAX CG ASSISTANCE to decrease limb volume, to limit infection risk, and to limit lymphedema progression.    Baseline Dependent    Time 4    Period Days    Status Achieved   Pt met and exceeded this goal as he is able to wrap independently     OT LONG TERM GOAL #3   Title Pt will demonstrate understanding of lymphedema prevention strategies by identifying and discussing 5 precautions using printed reference (modified assistance) to reduce risk of progression and to limit infection risk.    Baseline Max A    Time 4    Period Days    Status Achieved    Target Date --   4th OT Rx visit     OT LONG TERM GOAL #4   Title Pt will achieve at least a 10% limb volume reduction in bilateral legs to return limb to normal size and shape,  to limit lymphedema progression and to limit infection risk.    Baseline mAx    Time 12    Period Weeks    Status Partially Met   LLE reduced by 18.8 % in 10 visits.   Target Date 08/23/21      OT LONG TERM GOAL #5   Title With MAX CG ASSISTANCE Pt will achieve and sustain a least 85% compliance with all 4 LE self-care home program components throughout Intensive Phase CDT, including modified simple self-MLD, daily skin inspection and care, lymphatic pumping the ex, 23/7 compression wraps to sustain clinical gains made in CDT and to limit lymphedema progression and further functional decline.    Baseline Dependent    Time 12    Period Weeks    Status Achieved   Met and exceeded goal. Pt modified independent  (extra time) with all home program components and consistently 85% compliant w LE self care   Target Date 08/23/21                   Plan - 08/10/21 1555     Clinical Impression Statement Reviewed progress towards all OT goals  for lymphedema care to date. LLE comparative limb volumetrics from ankle to tibial tuberosity reveal Lleg volume is reduced 18.88% since initially measured on visit 1 on 06/08/21. This value meets and exceeds the 10% limb volume reduction goal for the L leg. Repeat on the FOTO scale reveals Pt increased functional score by 2 points to 53 from 51/100 initially on 06/08/22. Pt is able to apply compression wraps correctly with extra time and he remains >85 % compliant w home program overall. Refer to LONG TERM GOALS section for detailed progress report., Commenced teaching simple self-MLD. Pt mastered J stroke and short neck sequence. Continued LLE/LLQ MLD and applied compression wraps as established. Custom LLE garment is on order. Fit when available from DME v endor. Cont as per POC.    OT Occupational Profile and History Comprehensive Assessment- Review of records and extensive additional review of physical, cognitive, psychosocial history related to current functional performance    Occupational performance deficits (Please refer to evaluation for details): ADL's;IADL's;Work;Leisure;Social Participation;Other   role performance   Body Structure / Function / Physical Skills ADL;Edema;Skin integrity;Flexibility;Pain;ROM;Decreased knowledge of use of DME;Scar mobility;IADL    Rehab Potential Good    Clinical Decision Making Several treatment options, min-mod task modification necessary    Comorbidities Affecting Occupational Performance: Presence of comorbidities impacting occupational performance    Modification or Assistance to Complete Evaluation  Min-Moderate modification of tasks or assist with assess necessary to complete eval    OT Frequency 2x / week    OT  Duration 12 weeks   and PRN for follow along   OT Treatment/Interventions Self-care/ADL training;Therapeutic exercise;Manual Therapy;Coping strategies training;Therapeutic activities;Manual lymph drainage;DME and/or AE instruction;Compression bandaging;Other (comment);Patient/family education   skin care to limit infection risk   Plan Complete Decongestive Therapy (CDT) One leg at a time to limit fall LLE first. Manual lymphatic drainage (MLD), skin care, ther ex, compression wraps, then    OT Home Exercise Plan Pt verbalized understanding that he requires assistance with all lymphedema home care components, especially compression wrapping, between visits for optimal prognosis. Without assistance prognosis is poor    Recommended Other Services In insurance benefits available, garmentsConsider advanced sequential pneumatic compression device (Flexitouch) to maximize independence w lymphedema self-care at home over time. Pt unable to perform simple self-mld    Consulted and Agree with Plan of Care Patient             Patient will benefit from skilled therapeutic intervention in order to improve the following deficits and impairments:   Body Structure / Function / Physical Skills: ADL, Edema, Skin integrity, Flexibility, Pain, ROM, Decreased knowledge of use of DME, Scar mobility, IADL       Visit Diagnosis: Lymphedema, not elsewhere classified    Problem List Patient Active Problem List   Diagnosis Date Noted   Persistent atrial fibrillation (HCC)    Graves disease    Abdominal bloating    Atrial fibrillation with RVR ([place]) 10/01/2019   Hyperthyroidism    Atrial fibrillation with rapid ventricular response ([place]) 09/30/2019   Leg edema    Left leg cellulitis    Special screening for malignant neoplasms, colon    Polyp of sigmoid colon    Benign neoplasm of ascending colon    Rectal polyp    Herniated nucleus pulposus, L5-S1 11/09/2015   Low back pain 10/25/2015   Herniated  nucleus pulposus, C5-6 right 10/06/2015   Dysesthesia 09/16/2015   Spasticity 09/16/2015   Unsteady gait 09/16/2015   Multiple sclerosis (  [place]) 06/08/2011   Andrey Spearman, MS, OTR/L, [facility] 08/10/21 3:59 PM   [place] MAIN [facility] SERVICES [address] [place], [place], [address] Phone: [phone]   Fax:  [phone]  Name: Dustin Gray MRN: [id number] Date of Birth: February 10, 1956

## 2021-08-10 NOTE — Therapy (Signed)
[place] MAIN [facility] SERVICES [address] [place], [place], [address] Phone: [phone]   Fax:  [phone]  Physical Therapy Treatment  Patient Details  Name: Dustin Gray MRN: [id number] Date of Birth: Oct 20, 1955 No data recorded  Encounter Date: 08/10/2021   PT End of Session - 08/10/21 1412     Visit Number 37    Number of Visits 43    Date for PT Re-Evaluation 10/11/21    Authorization Type [employer]    Authorization Time Period 01/31/21-04/25/21    PT Start Time 1406    PT Stop Time 1444    PT Time Calculation (min) 38 min    Equipment Utilized During Treatment Gait belt    Activity Tolerance Patient tolerated treatment well;No increased pain;Patient limited by fatigue    Behavior During Therapy [facility] for tasks assessed/performed             Past Medical History:  Diagnosis Date   Abscess    groin   Arthritis    lower spine   Erectile dysfunction    Low testosterone    Lumbar herniated disc    L5   Multiple sclerosis (HCC)    Staph aureus infection     Past Surgical History:  Procedure Laterality Date   COLONOSCOPY WITH PROPOFOL N/A 07/04/2016   Procedure: COLONOSCOPY WITH PROPOFOL;  Surgeon: [name], MD;  Location: [place];  Service: Endoscopy;  Laterality: N/A;   [place]   POLYPECTOMY  07/04/2016   Procedure: POLYPECTOMY;  Surgeon: [name], MD;  Location: [place];  Service: Endoscopy;;   TONSILLECTOMY      There were no vitals filed for this visit.   Subjective Assessment - 08/10/21 1410     Subjective Patient reports doing better and not as stressed as last week.    Pertinent History Patient presents to physical therapy for unsteadiness, walking instability, fatigue. His PMH includes paroxysmal a fib, MS, lymphedema, hypothyroidism, Graves Disease, arthritis, lumbar herniated disc (L5), depression and neuropathy. Patient first developed  symptoms of MS in 2005 and was diagnosed in 2006. Patient has done PT in the past, but hasn't been seen since 2020 at [facility]. Has not been doing his exercises in the past year. Walk outside to garage to ride lawnmower, walks in house. 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 a couple hours    How long can you stand comfortably? no more than 10 minutes before pain, have to steady self immediately    How long can you walk comfortably? has to use rollator. can walk in a store with a cart    Patient Stated Goals want to get stronger and feel mor comfortable walking around. Patient would like to continue strengthening hips and improve endurance and ability to pace self for safety while walking.    Currently in Pain? No/denies                 INTERVENTIONS:    By support bar: CGA and use of gait belt unless otherwise stated.   Neuro re-ed   Side stepping over green therapad x 12 reps (left to right)    1/2 tandem stand on blue airex pad - hold 30 sec x 2 trials without UE support  TherEx   -Step ups with Min UE support on rails x 12 reps each leg  -Standing TKE on left LE with GTB x 12 reps. Patient reports as easy  -Hip hike (1 LE on top of green therapad and instructed to hip hike)  -Dynamic marching (high knees) with UE support (mild unsteadiness yet no LOB) 10x each LE   -hip abduction 12x each LE BUE support   -Sit to stand with blue airex pad under right LE x 10 reps without UE support.    Pt educated throughout session about proper posture and technique with exercises. Improved exercise technique, movement at target joints, use of target muscles after min to mod verbal, visual, tactile cues.                                            PT Education - 08/10/21 1411     Education Details  Exercise technique    Person(s) Educated Patient    Methods Explanation;Demonstration;Tactile cues;Verbal cues    Comprehension Verbalized understanding;Returned demonstration;Need further instruction;Tactile cues required;Verbal cues required              PT Short Term Goals - 07/19/21 1603       PT SHORT TERM GOAL #1   Title Patient will be independent in home exercise program to improve strength/mobility for better functional independence with ADLs.    Baseline 8/9: HEP to be given next session, 10/31: patient reports compliance with HEP and would like progressions added next session.    Time 4    Period Weeks    Status On-going    Target Date 08/16/21               PT Long Term Goals - 07/19/21 1603       PT LONG TERM GOAL #1   Title Patient will increase FOTO score to equal to or greater than 70%    to demonstrate statistically significant improvement in mobility and quality of life.    Baseline 8/9: 64%; risk adjusted 34%; 03/21/21 FOTO: 45; 04/24/21 FOTO: 49%    Time 12    Period Weeks    Status Partially Met    Target Date 10/11/21      PT LONG TERM GOAL #2   Title Patient will increase Berg Balance score by > 6 points to demonstrate decreased fall risk during functional activities.    Baseline 8/9: BBT 29/56; 03/21/21: 42/56    Time 12    Period Weeks    Status Achieved      PT LONG TERM GOAL #3   Title Patient will increase 10 meter walk test to >1.105ms as to improve gait speed for better community ambulation and to reduce fall risk.    Baseline 8/9: 0.65 m/s with rollator; 10/31: 0.68 m/s with rollator. 07/06/2021= 0.71 m/s using rollator    Time 12    Period Weeks    Status On-going    Target Date 10/11/21      PT LONG TERM GOAL #4   Title Patient will increase six minute walk test distance to >1000 for progression to community ambulator and improve gait ability    Baseline 8/9: 505 ft with rollator; 03/21/21: [id number] rollator, 04/24/21: [id number]  rollator; 07/06/2021=900 feet    Time 12    Period Weeks    Status On-going  Target Date 10/11/21      PT LONG TERM GOAL #5   Title Patient will increase glute medius strength on Left LE from 3-/5 to 4/5 to improve stability, gait mechanics, and functional strength.    Baseline 10/31: 3-/5; 07/06/2021= 3-/5    Time 12    Period Weeks    Status On-going    Target Date 10/11/21      PT LONG TERM GOAL #6   Title Patient will increase Left single leg stance time to 15 seconds or greater to increase safety in shower and independence with ADLs.    Baseline 10/31; 1.83 sec without UE support    Time 12    Period Weeks    Status New    Target Date 10/11/21                   Plan - 08/10/21 1412     Clinical Impression Statement Patient presents with good motivation for today's session. His left LE remains limited/weak but he works hard and able to complete all tasks today. Max difficulty with hip hike on left due to weakness but improved ability to attain 1/2 tandem today.  Patient will continue to benefit from skilled physical therapy intervention in order to improve his strength, ambulatory capacity, balance, and increase his overall function and safety with everyday ambulation    Personal Factors and Comorbidities Age;Comorbidity 3+;Fitness;Past/Current Experience;Time since onset of injury/illness/exacerbation    Comorbidities aroxysmal a fib, MS, lymphedema, hypothyroidism, Graves Disease, arthritis, lumbar herniated disc (L5), depression and neuropathy    Examination-Activity Limitations Bed Mobility;Bend;Caring for Others;Carry;Reach Overhead;Locomotion Level;Lift;Hygiene/Grooming;Dressing;Squat;Stairs;Stand;Toileting;Transfers    Examination-Participation Restrictions Cleaning;Community Activity;Laundry;Occupation;Shop;Volunteer;Yard Work    [employer] officer Evolving/Moderate complexity    Rehab Potential Fair    PT Frequency 2x / week    PT Duration 12  weeks    PT Treatment/Interventions ADLs/Self Care Home Management;Aquatic Therapy;Canalith Repostioning;Biofeedback;Cryotherapy;Ultrasound;Traction;Moist Heat;Electrical Stimulation;DME Instruction;Gait training;Therapeutic exercise;Therapeutic activities;Functional mobility training;Stair training;Balance training;Neuromuscular re-education;Patient/family education;Manual techniques;Orthotic Fit/Training;Compression bandaging;Passive range of motion;Vestibular;Taping;Splinting;Energy conservation;Dry needling;Visual/perceptual remediation/compensation    PT Next Visit Plan Continue with progessive balance training, Progressive LE strenthening as appropriate.    PT Home Exercise Plan Access Code: [email or web address] URL: [email or web address]  Added [place] with BTB and encouraged more walking around the home during commerical breaks when watching TV.    Consulted and Agree with Plan of Care Patient             Patient will benefit from skilled therapeutic intervention in order to improve the following deficits and impairments:  Abnormal gait, Cardiopulmonary status limiting activity, Decreased activity tolerance, Decreased coordination, Decreased balance, Decreased endurance, Decreased mobility, Decreased strength, Decreased range of motion, Difficulty walking, Impaired flexibility, Impaired sensation, Postural dysfunction, Improper body mechanics  Visit Diagnosis: Abnormality of gait and mobility  Difficulty in walking, not elsewhere classified  Muscle weakness (generalized)  Unsteadiness on feet     Problem List Patient Active Problem List   Diagnosis Date Noted   Persistent atrial fibrillation (HCC)    Graves disease    Abdominal bloating    Atrial fibrillation with RVR ([place]) 10/01/2019   Hyperthyroidism    Atrial fibrillation with rapid ventricular response ([place]) 09/30/2019   Leg edema    Left leg cellulitis    Special screening for malignant neoplasms, colon    Polyp  of sigmoid colon    Benign neoplasm of ascending colon    Rectal polyp    Herniated nucleus pulposus, L5-S1 11/09/2015   Low back pain 10/25/2015  Herniated nucleus pulposus, C5-6 right 10/06/2015   Dysesthesia 09/16/2015   Spasticity 09/16/2015   Unsteady gait 09/16/2015   Multiple sclerosis ([place]) 06/08/2011    Lewis Moccasin, PT 08/11/2021, 11:04 AM  [place] MAIN [facility] SERVICES [address] [place], [place], [address] Phone: [phone]   Fax:  [phone]  Name: BOCEPHUS CALI MRN: [id number] Date of Birth: 1956/04/01

## 2021-08-11 NOTE — Therapy (Deleted)
[place] MAIN [facility] SERVICES [place], [place], [address] Phone: [phone]   Fax:  [phone]  Occupational Therapy Treatment  Patient Details  Name: Dustin Gray MRN: [id number] Date of Birth: 1955/08/19 Referring Provider (OT): Staci Acosta, MD   Encounter Date: 08/08/2021    Past Medical History:  Diagnosis Date   Abscess    groin   Arthritis    lower spine   Erectile dysfunction    Low testosterone    Lumbar herniated disc    L5   Multiple sclerosis ([place])    Staph aureus infection     Past Surgical History:  Procedure Laterality Date   COLONOSCOPY WITH PROPOFOL N/A 07/04/2016   Procedure: COLONOSCOPY WITH PROPOFOL;  Surgeon: [name], MD;  Location: [place];  Service: Endoscopy;  Laterality: N/A;   [place]   POLYPECTOMY  07/04/2016   Procedure: POLYPECTOMY;  Surgeon: [name], MD;  Location: [place];  Service: Endoscopy;;   TONSILLECTOMY      There were no vitals filed for this visit.                              OT Long Term Goals - 08/10/21 1319       OT LONG TERM GOAL #1   Title Given this patients Intake score of 51/100 on the functional outcomes FOTO tool, patient will experience an increase in function of 5 points to improve basic and instrumental ADLs performance, including lymphedema self-care.    Baseline Max A    Time 12    Period Weeks    Status Partially Met   08/08/21 ( OT visit 9) increased 2 points tfrom 51/100 initially to 53100 today.     OT LONG TERM GOAL #2   Title Pt will be able to apply knee length, multi-layer, short stretch compression wraps to one limb at a time using gradient techniques with MAX CG ASSISTANCE to decrease limb volume, to limit infection risk, and to limit lymphedema progression.    Baseline Dependent    Time 4    Period Days    Status Achieved   Pt met and exceeded  this goal as he is able to wrap independently     OT LONG TERM GOAL #3   Title Pt will demonstrate understanding of lymphedema prevention strategies by identifying and discussing 5 precautions using printed reference (modified assistance) to reduce risk of progression and to limit infection risk.    Baseline Max A    Time 4    Period Days    Status Achieved    Target Date --   4th OT Rx visit     OT LONG TERM GOAL #4   Title Pt will achieve at least a 10% limb volume reduction in bilateral legs to return limb to normal size and shape,  to limit lymphedema progression and to limit infection risk.    Baseline mAx    Time 12    Period Weeks    Status Partially Met   LLE reduced by 18.8 % in 10 visits.   Target Date 08/23/21      OT LONG TERM GOAL #5   Title With MAX CG ASSISTANCE Pt will achieve and sustain a least 85% compliance with all 4 LE self-care home program components throughout Intensive Phase CDT, including modified simple self-MLD, daily  skin inspection and care, lymphatic pumping the ex, 23/7 compression wraps to sustain clinical gains made in CDT and to limit lymphedema progression and further functional decline.    Baseline Dependent    Time 12    Period Weeks    Status Achieved   Met and exceeded goal. Pt modified independent (extra time) with all home program components and consistently 85% compliant w LE self care   Target Date 08/23/21                    Patient will benefit from skilled therapeutic intervention in order to improve the following deficits and impairments:   Body Structure / Function / Physical Skills: ADL, Edema, Skin integrity, Flexibility, Pain, ROM, Decreased knowledge of use of DME, Scar mobility, IADL       Visit Diagnosis: Lymphedema, not elsewhere classified    Problem List Patient Active Problem List   Diagnosis Date Noted   Persistent atrial fibrillation ([place])    Graves disease    Abdominal bloating    Atrial  fibrillation with RVR ([place]) 10/01/2019   Hyperthyroidism    Atrial fibrillation with rapid ventricular response ([place]) 09/30/2019   Leg edema    Left leg cellulitis    Special screening for malignant neoplasms, colon    Polyp of sigmoid colon    Benign neoplasm of ascending colon    Rectal polyp    Herniated nucleus pulposus, L5-S1 11/09/2015   Low back pain 10/25/2015   Herniated nucleus pulposus, C5-6 right 10/06/2015   Dysesthesia 09/16/2015   Spasticity 09/16/2015   Unsteady gait 09/16/2015   Multiple sclerosis ([place]) 06/08/2011    Ansel Bong, OT 08/11/2021, 1:38 PM  [place] MAIN [facility] SERVICES [address] [place], [place], [address] Phone: [phone]   Fax:  [phone]  Name: Dustin Gray MRN: [id number] Date of Birth: [birth year]

## 2021-08-11 NOTE — Therapy (Deleted)
[place] MAIN [facility] SERVICES [address] [place], [place], [address] Phone: [phone]   Fax:  [phone]  Occupational Therapy Treatment  Patient Details  Name: Dustin Gray MRN: [id number] Date of Birth: 04/05/1956 Referring Provider (OT): Staci Acosta, MD   Encounter Date: 08/08/2021   OT End of Session - 08/11/21 1338     Visit Number 10    Number of Visits 36    Date for OT Re-Evaluation 08/23/21    Activity Tolerance Patient tolerated treatment well;No increased pain    Behavior During Therapy WFL for tasks assessed/performed             Past Medical History:  Diagnosis Date   Abscess    groin   Arthritis    lower spine   Erectile dysfunction    Low testosterone    Lumbar herniated disc    L5   Multiple sclerosis (HCC)    Staph aureus infection     Past Surgical History:  Procedure Laterality Date   COLONOSCOPY WITH PROPOFOL N/A 07/04/2016   Procedure: COLONOSCOPY WITH PROPOFOL;  Surgeon: [name], MD;  Location: [place];  Service: Endoscopy;  Laterality: N/A;   [place]   POLYPECTOMY  07/04/2016   Procedure: POLYPECTOMY;  Surgeon: [name], MD;  Location: [place];  Service: Endoscopy;;   TONSILLECTOMY      There were no vitals filed for this visit.                              OT Long Term Goals - 08/10/21 1319       OT LONG TERM GOAL #1   Title Given this patients Intake score of 51/100 on the functional outcomes FOTO tool, patient will experience an increase in function of 5 points to improve basic and instrumental ADLs performance, including lymphedema self-care.    Baseline Max A    Time 12    Period Weeks    Status Partially Met   08/08/21 ( OT visit 9) increased 2 points tfrom 51/100 initially to 53100 today.     OT LONG TERM GOAL #2   Title Pt will be able to apply knee length, multi-layer, short  stretch compression wraps to one limb at a time using gradient techniques with MAX CG ASSISTANCE to decrease limb volume, to limit infection risk, and to limit lymphedema progression.    Baseline Dependent    Time 4    Period Days    Status Achieved   Pt met and exceeded this goal as he is able to wrap independently     OT LONG TERM GOAL #3   Title Pt will demonstrate understanding of lymphedema prevention strategies by identifying and discussing 5 precautions using printed reference (modified assistance) to reduce risk of progression and to limit infection risk.    Baseline Max A    Time 4    Period Days    Status Achieved    Target Date --   4th OT Rx visit     OT LONG TERM GOAL #4   Title Pt will achieve at least a 10% limb volume reduction in bilateral legs to return limb to normal size and shape,  to limit lymphedema progression and to limit infection risk.    Baseline mAx    Time 12    Period Weeks  Status Partially Met   LLE reduced by 18.8 % in 10 visits.   Target Date 08/23/21      OT LONG TERM GOAL #5   Title With MAX CG ASSISTANCE Pt will achieve and sustain a least 85% compliance with all 4 LE self-care home program components throughout Intensive Phase CDT, including modified simple self-MLD, daily skin inspection and care, lymphatic pumping the ex, 23/7 compression wraps to sustain clinical gains made in CDT and to limit lymphedema progression and further functional decline.    Baseline Dependent    Time 12    Period Weeks    Status Achieved   Met and exceeded goal. Pt modified independent (extra time) with all home program components and consistently 85% compliant w LE self care   Target Date 08/23/21                    Patient will benefit from skilled therapeutic intervention in order to improve the following deficits and impairments:   Body Structure / Function / Physical Skills: ADL, Edema, Skin integrity, Flexibility, Pain, ROM, Decreased knowledge of  use of DME, Scar mobility, IADL       Visit Diagnosis: Lymphedema, not elsewhere classified    Problem List Patient Active Problem List   Diagnosis Date Noted   Persistent atrial fibrillation ([place])    Graves disease    Abdominal bloating    Atrial fibrillation with RVR ([place]) 10/01/2019   Hyperthyroidism    Atrial fibrillation with rapid ventricular response ([place]) 09/30/2019   Leg edema    Left leg cellulitis    Special screening for malignant neoplasms, colon    Polyp of sigmoid colon    Benign neoplasm of ascending colon    Rectal polyp    Herniated nucleus pulposus, L5-S1 11/09/2015   Low back pain 10/25/2015   Herniated nucleus pulposus, C5-6 right 10/06/2015   Dysesthesia 09/16/2015   Spasticity 09/16/2015   Unsteady gait 09/16/2015   Multiple sclerosis ([place]) 06/08/2011    Ansel Bong, OT 08/11/2021, 1:44 PM  [place] MAIN [facility] SERVICES [address] [place], [place], [address] Phone: [phone]   Fax:  [phone]  Name: Dustin Gray MRN: [id number] Date of Birth: 1955-08-01

## 2021-08-11 NOTE — Therapy (Signed)
[place] MAIN [facility] SERVICES [address] [place], [place], [address] Phone: [phone]   Fax:  [phone]  Occupational Therapy Treatment  Patient Details  Name: Dustin Gray MRN: [id number] Date of Birth: 04-13-1956 Referring Provider (OT): Staci Acosta, MD   Encounter Date: 08/08/2021   OT End of Session - 08/11/21 1344     Visit Number 9    Number of Visits 36    Date for OT Re-Evaluation 08/23/21    OT Stop Time 0145    Activity Tolerance Patient tolerated treatment well;No increased pain    Behavior During Therapy WFL for tasks assessed/performed             Past Medical History:  Diagnosis Date   Abscess    groin   Arthritis    lower spine   Erectile dysfunction    Low testosterone    Lumbar herniated disc    L5   Multiple sclerosis (HCC)    Staph aureus infection     Past Surgical History:  Procedure Laterality Date   COLONOSCOPY WITH PROPOFOL N/A 07/04/2016   Procedure: COLONOSCOPY WITH PROPOFOL;  Surgeon: [name], MD;  Location: [place];  Service: Endoscopy;  Laterality: N/A;   [place]   POLYPECTOMY  07/04/2016   Procedure: POLYPECTOMY;  Surgeon: [name], MD;  Location: [place];  Service: Endoscopy;;   TONSILLECTOMY      There were no vitals filed for this visit.   Subjective Assessment - 08/11/21 1345     Subjective  Dustin Gray presents for OT visit 9/36 to address BLE lymphedema. Pt reports he missed last 2 OT visits due to episode of Afib. Pt brings wraps to clinic. He is wearing loaned, off the shelf,  ccl 2 compression stocking on LLE, which is quite swollen today.    Pertinent History relevant to LE: HTN, MS, Herniated C-5-C6, L5-S1, chronic low back pain, persistent Afib,Hx LLE cellulitis, Graves disease, HYPERthyroidism, OA    Limitations difficulty walking, impaired functional mobility and transfers, impaired balance,  muscle weakness, L>R,, altered sensation, chronic back pain , chronic leg swelling and associated pain, spasticity in legs    Repetition Increases Symptoms    Special Tests +Stemmer sign, L>R; Intake FOTO: 51/100    Patient Stated Goals Learn about lymphedema and explore treatment options    Pain Onset More than a month ago    Pain Onset More than a month ago                          OT Treatments/Exercises (OP) - 08/11/21 0001       ADLs   ADL Education Given Yes      Manual Therapy   Manual Therapy Edema management;Compression Bandaging    Manual therapy comments 9th visit FOTO 53/100    Edema Management Completed  LLE comparative limb volumetrics    in prep for progress report on visit 10. Completed anatomical measurements for custom, LLE, flat knit Elvarex SOFT compression knee high, ccl 3.Repeated FOTO survey for comparison against baseline.    Compression Bandaging 4 layer knee length compression wrap , including foot , from base of toes to popliteal fossa using stockinett as base layer, then single layer Rosidal foam under 1 each 8,10 and 12 cm wide short stretch bandages.  OT Education - 08/11/21 1345     Education Details Discussed progress towards all OT goals for LE care in prep for progress note on visit 10.  Discssed custom compression garment options, measuring and fitting process, orderiing and fitting processes. Discussed custom vs circular knit recommendations as loaned [place] 2 garment did not cntain Pt adequately to control lymphedema and keep it from progressing.    Person(s) Educated Patient    Methods Explanation;Demonstration;Handout    Comprehension Verbalized understanding;Returned demonstration;Need further instruction                 OT Long Term Goals - 08/10/21 1319       OT LONG TERM GOAL #1   Title Given this patients Intake score of 51/100 on the functional outcomes FOTO tool, patient will  experience an increase in function of 5 points to improve basic and instrumental ADLs performance, including lymphedema self-care.    Baseline Max A    Time 12    Period Weeks    Status Partially Met   08/08/21 ( OT visit 9) increased 2 points tfrom 51/100 initially to 53100 today.     OT LONG TERM GOAL #2   Title Pt will be able to apply knee length, multi-layer, short stretch compression wraps to one limb at a time using gradient techniques with MAX CG ASSISTANCE to decrease limb volume, to limit infection risk, and to limit lymphedema progression.    Baseline Dependent    Time 4    Period Days    Status Achieved   Pt met and exceeded this goal as he is able to wrap independently     OT LONG TERM GOAL #3   Title Pt will demonstrate understanding of lymphedema prevention strategies by identifying and discussing 5 precautions using printed reference (modified assistance) to reduce risk of progression and to limit infection risk.    Baseline Max A    Time 4    Period Days    Status Achieved    Target Date --   4th OT Rx visit     OT LONG TERM GOAL #4   Title Pt will achieve at least a 10% limb volume reduction in bilateral legs to return limb to normal size and shape,  to limit lymphedema progression and to limit infection risk.    Baseline mAx    Time 12    Period Weeks    Status Partially Met   LLE reduced by 18.8 % in 10 visits.   Target Date 08/23/21      OT LONG TERM GOAL #5   Title With MAX CG ASSISTANCE Pt will achieve and sustain a least 85% compliance with all 4 LE self-care home program components throughout Intensive Phase CDT, including modified simple self-MLD, daily skin inspection and care, lymphatic pumping the ex, 23/7 compression wraps to sustain clinical gains made in CDT and to limit lymphedema progression and further functional decline.    Baseline Dependent    Time 12    Period Weeks    Status Achieved   Met and exceeded goal. Pt modified independent (extra time)  with all home program components and consistently 85% compliant w LE self care   Target Date 08/23/21                   Plan - 08/11/21 1344     Clinical Impression Statement LLE comparative limb volumetrics from ankle to tibial tuberosity reveal Dustin Gray volume is reduced 18.88% since  initially measured on visit 1 on 06/08/21. This value meets and exceeds the 10% limb volume reduction goal for the L leg. Repeat on the FOTO scale reveals Pt increased functional score by 2 points to 53 from 51/100 initially on 06/08/22.    OT Occupational Profile and History Comprehensive Assessment- Review of records and extensive additional review of physical, cognitive, psychosocial history related to current functional performance    Occupational performance deficits (Please refer to evaluation for details): ADL's;IADL's;Work;Leisure;Social Participation;Other   role performance   Body Structure / Function / Physical Skills ADL;Edema;Skin integrity;Flexibility;Pain;ROM;Decreased knowledge of use of DME;Scar mobility;IADL    Rehab Potential Good    Clinical Decision Making Several treatment options, min-mod task modification necessary    Comorbidities Affecting Occupational Performance: Presence of comorbidities impacting occupational performance    Modification or Assistance to Complete Evaluation  Min-Moderate modification of tasks or assist with assess necessary to complete eval    OT Frequency 2x / week    OT Duration 12 weeks   and PRN for follow along   OT Treatment/Interventions Self-care/ADL training;Therapeutic exercise;Manual Therapy;Coping strategies training;Therapeutic activities;Manual lymph drainage;DME and/or AE instruction;Compression bandaging;Other (comment);Patient/family education   skin care to limit infection risk   Plan Complete Decongestive Therapy (CDT) One leg at a time to limit fall LLE first. Manual lymphatic drainage (MLD), skin care, ther ex, compression wraps, then    OT Home  Exercise Plan Pt verbalized understanding that he requires assistance with all lymphedema home care components, especially compression wrapping, between visits for optimal prognosis. Without assistance prognosis is poor    Recommended Other Services In insurance benefits available, garmentsConsider advanced sequential pneumatic compression device (Flexitouch) to maximize independence w lymphedema self-care at home over time. Pt unable to perform simple self-mld    Consulted and Agree with Plan of Care Patient             Patient will benefit from skilled therapeutic intervention in order to improve the following deficits and impairments:   Body Structure / Function / Physical Skills: ADL, Edema, Skin integrity, Flexibility, Pain, ROM, Decreased knowledge of use of DME, Scar mobility, IADL       Visit Diagnosis: Lymphedema, not elsewhere classified    Problem List Patient Active Problem List   Diagnosis Date Noted   Persistent atrial fibrillation (HCC)    Graves disease    Abdominal bloating    Atrial fibrillation with RVR ([place]) 10/01/2019   Hyperthyroidism    Atrial fibrillation with rapid ventricular response ([place]) 09/30/2019   Leg edema    Left leg cellulitis    Special screening for malignant neoplasms, colon    Polyp of sigmoid colon    Benign neoplasm of ascending colon    Rectal polyp    Herniated nucleus pulposus, L5-S1 11/09/2015   Low back pain 10/25/2015   Herniated nucleus pulposus, C5-6 right 10/06/2015   Dysesthesia 09/16/2015   Spasticity 09/16/2015   Unsteady gait 09/16/2015   Multiple sclerosis ([place]) 06/08/2011    Andrey Spearman, MS, OTR/L, CLT-LANA 08/11/21 1:46 PM   [facility] MAIN [facility] SERVICES [place], [place], [address] Phone: [phone]   Fax:  [phone]  Name: Dustin Gray MRN: [id number] Date of Birth: Aug 22, 1955

## 2021-08-11 NOTE — Therapy (Deleted)
[place] MAIN [facility] SERVICES [place], [place], [address] Phone: [phone]   Fax:  [phone]  Occupational Therapy Treatment  Patient Details  Name: Dustin Gray MRN: [id number] Date of Birth: 1955/09/14 Referring Provider (OT): Staci Acosta, MD   Encounter Date: 08/08/2021    Past Medical History:  Diagnosis Date   Abscess    groin   Arthritis    lower spine   Erectile dysfunction    Low testosterone    Lumbar herniated disc    L5   Multiple sclerosis ([place])    Staph aureus infection     Past Surgical History:  Procedure Laterality Date   COLONOSCOPY WITH PROPOFOL N/A 07/04/2016   Procedure: COLONOSCOPY WITH PROPOFOL;  Surgeon: [name], MD;  Location: [place];  Service: Endoscopy;  Laterality: N/A;   [place]   POLYPECTOMY  07/04/2016   Procedure: POLYPECTOMY;  Surgeon: [name], MD;  Location: [place];  Service: Endoscopy;;   TONSILLECTOMY      There were no vitals filed for this visit.                              OT Long Term Goals - 08/10/21 1319       OT LONG TERM GOAL #1   Title Given this patients Intake score of 51/100 on the functional outcomes FOTO tool, patient will experience an increase in function of 5 points to improve basic and instrumental ADLs performance, including lymphedema self-care.    Baseline Max A    Time 12    Period Weeks    Status Partially Met   08/08/21 ( OT visit 9) increased 2 points tfrom 51/100 initially to 53100 today.     OT LONG TERM GOAL #2   Title Pt will be able to apply knee length, multi-layer, short stretch compression wraps to one limb at a time using gradient techniques with MAX CG ASSISTANCE to decrease limb volume, to limit infection risk, and to limit lymphedema progression.    Baseline Dependent    Time 4    Period Days    Status Achieved   Pt met and exceeded  this goal as he is able to wrap independently     OT LONG TERM GOAL #3   Title Pt will demonstrate understanding of lymphedema prevention strategies by identifying and discussing 5 precautions using printed reference (modified assistance) to reduce risk of progression and to limit infection risk.    Baseline Max A    Time 4    Period Days    Status Achieved    Target Date --   4th OT Rx visit     OT LONG TERM GOAL #4   Title Pt will achieve at least a 10% limb volume reduction in bilateral legs to return limb to normal size and shape,  to limit lymphedema progression and to limit infection risk.    Baseline mAx    Time 12    Period Weeks    Status Partially Met   LLE reduced by 18.8 % in 10 visits.   Target Date 08/23/21      OT LONG TERM GOAL #5   Title With MAX CG ASSISTANCE Pt will achieve and sustain a least 85% compliance with all 4 LE self-care home program components throughout Intensive Phase CDT, including modified simple self-MLD, daily  skin inspection and care, lymphatic pumping the ex, 23/7 compression wraps to sustain clinical gains made in CDT and to limit lymphedema progression and further functional decline.    Baseline Dependent    Time 12    Period Weeks    Status Achieved   Met and exceeded goal. Pt modified independent (extra time) with all home program components and consistently 85% compliant w LE self care   Target Date 08/23/21                    Patient will benefit from skilled therapeutic intervention in order to improve the following deficits and impairments:   Body Structure / Function / Physical Skills: ADL, Edema, Skin integrity, Flexibility, Pain, ROM, Decreased knowledge of use of DME, Scar mobility, IADL       Visit Diagnosis: Lymphedema, not elsewhere classified    Problem List Patient Active Problem List   Diagnosis Date Noted   Persistent atrial fibrillation ([place])    Graves disease    Abdominal bloating    Atrial  fibrillation with RVR ([place]) 10/01/2019   Hyperthyroidism    Atrial fibrillation with rapid ventricular response ([place]) 09/30/2019   Leg edema    Left leg cellulitis    Special screening for malignant neoplasms, colon    Polyp of sigmoid colon    Benign neoplasm of ascending colon    Rectal polyp    Herniated nucleus pulposus, L5-S1 11/09/2015   Low back pain 10/25/2015   Herniated nucleus pulposus, C5-6 right 10/06/2015   Dysesthesia 09/16/2015   Spasticity 09/16/2015   Unsteady gait 09/16/2015   Multiple sclerosis ([place]) 06/08/2011    Ansel Bong, OT 08/11/2021, 1:37 PM  [place] MAIN [facility] SERVICES [address] [place], [place], [address] Phone: [phone]   Fax:  [phone]  Name: Dustin Gray MRN: [id number] Date of Birth: 1956-01-15

## 2021-08-15 ENCOUNTER — Other Ambulatory Visit: Payer: Self-pay

## 2021-08-15 ENCOUNTER — Ambulatory Visit: Payer: Medicare PPO | Admitting: Occupational Therapy

## 2021-08-15 ENCOUNTER — Ambulatory Visit: Payer: Medicare PPO

## 2021-08-15 DIAGNOSIS — I890 Lymphedema, not elsewhere classified: Secondary | ICD-10-CM

## 2021-08-15 DIAGNOSIS — R269 Unspecified abnormalities of gait and mobility: Secondary | ICD-10-CM | POA: Diagnosis not present

## 2021-08-15 DIAGNOSIS — R262 Difficulty in walking, not elsewhere classified: Secondary | ICD-10-CM

## 2021-08-15 DIAGNOSIS — M6281 Muscle weakness (generalized): Secondary | ICD-10-CM

## 2021-08-15 DIAGNOSIS — R2681 Unsteadiness on feet: Secondary | ICD-10-CM | POA: Diagnosis not present

## 2021-08-15 NOTE — Patient Instructions (Addendum)
Lymphedema Self- Care Instructions   1. EXERCISE: Perform lymphatic pumping there ex at least 2 x a day while wearing your compression wraps or garments. Perform 10 reps of each exercise bilaterally and be sure to perform them in order. Don't skip around!  OMIT PARTIAL SIT UPs.  2. MLD: Perform simple self-manual lymphatic drainage (MLD) at least once a day as directed. Take your time! Breathe! ;-)  3. If you have a Flexitouch advanced "pump" use it 1 time each day on a single limb only. The Flexitouch moves lymphatic fluid out of your affected body part and back to your heart, so DO NOT use the Flexi on 2 legs at a time, and DO NOT ues it on 2 legs on the same day. If you experience any atypical shortness of breath, sudden onset of pain, or feelings of heart arhythmia, or racing, discontinue use of the Flexitouch and report these symptoms to your doctor right away. Also, discontinue Flexi if you have an infection or a fever. It's OK to resume using the device 72 hours AFTER your first dose of oral antibiotic.   4. 4. WRAPS: Compression wraps are to be worn 23 hrs/ 7 days/wk during Intensive Phase of Complete Decongestive Therapy (CDT).Building tolerance may take time and practice, so don't get discouraged. If bandages begin to feel tight during periods of inactivity and/or during the night, try performing your exercises to loosen them. Do not leave short stretch wraps in place for > 23 hours. It is very important that you remove all wraps daily to inspect skin, bathe and perform skin care before reapplying your wraps.  5. Daytime GARMENTS/ HOS DEVICES: During the Self-Management Phase CDT your compression garments are to be worn during waking hours when active. Do NOT sleep in your garments!!  Don daytime garments first thing in the morning. Do not wear your HOS devices all day instead of garments. These will not contain your swelling.  6. PUT YOUR FEET UP! Elevate your feet and legs and feet to the  level of your heart whenever you are sitting down.   7. SKIN: Carefully monitor skin condition and perform impeccable hygiene daily. Bathe skin with mild soap and water and apply low pH lotion (aka Eucerin ) to improve hydration and limit infection risk. Lymphedema Self- Care Instructions   1. EXERCISE: Perform lymphatic pumping there ex at least 2 x a day while wearing your compression wraps or garments. Perform 10 reps of each exercise bilaterally and be sure to perform them in order. Don't skip around!  OMIT PARTIAL SIT UPs.  2. MLD: Perform simple self-manual lymphatic drainage (MLD) at least once a day as directed. Take your time! Breathe! ;-)  3. If you have a Flexitouch advanced "pump" use it 1 time each day on a single limb only. The Flexitouch moves lymphatic fluid out of your affected body part and back to your heart, so DO NOT use the Flexi on 2 legs at a time, and DO NOT ues it on 2 legs on the same day. If you experience any atypical shortness of breath, sudden onset of pain, or feelings of heart arhythmia, or racing, discontinue use of the Flexitouch and report these symptoms to your doctor right away. Also, discontinue Flexi if you have an infection or a fever. It's OK to resume using the device 72 hours AFTER your first dose of oral antibiotic.   4. 4. WRAPS: Compression wraps are to be worn 23 hrs/ 7 days/wk during Intensive   Phase of Complete Decongestive Therapy (CDT).Building tolerance may take time and practice, so don't get discouraged. If bandages begin to feel tight during periods of inactivity and/or during the night, try performing your exercises to loosen them. Do not leave short stretch wraps in place for > 23 hours. It is very important that you remove all wraps daily to inspect skin, bathe and perform skin care before reapplying your wraps.  5. Daytime GARMENTS/ HOS DEVICES: During the Self-Management Phase CDT your compression garments are to be worn during waking hours  when active. Do NOT sleep in your garments!!  Don daytime garments first thing in the morning. Do not wear your HOS devices all day instead of garments. These will not contain your swelling.  6. PUT YOUR FEET UP! Elevate your feet and legs and feet to the level of your heart whenever you are sitting down.   7. SKIN: Carefully monitor skin condition and perform impeccable hygiene daily. Bathe skin with mild soap and water and apply low pH lotion (aka Eucerin ) to improve hydration and limit infection risk.  

## 2021-08-15 NOTE — Therapy (Signed)
[place] MAIN [facility] SERVICES [address] [place], [place], [address] Phone: [phone]   Fax:  [phone]  Physical Therapy Treatment  Patient Details  Name: Dustin Gray MRN: [id number] Date of Birth: [birth year] No data recorded  Encounter Date: 08/15/2021   PT End of Session - 08/15/21 1748     Visit Number 38    Number of Visits 43    Date for PT Re-Evaluation 10/11/21    Authorization Type [employer]    Authorization Time Period 01/31/21-04/25/21    PT Start Time 1304    PT Stop Time 1345    PT Time Calculation (min) 41 min    Equipment Utilized During Treatment Gait belt    Activity Tolerance Patient tolerated treatment well;No increased pain;Patient limited by fatigue    Behavior During Therapy [facility] for tasks assessed/performed             Past Medical History:  Diagnosis Date   Abscess    groin   Arthritis    lower spine   Erectile dysfunction    Low testosterone    Lumbar herniated disc    L5   Multiple sclerosis (HCC)    Staph aureus infection     Past Surgical History:  Procedure Laterality Date   COLONOSCOPY WITH PROPOFOL N/A 07/04/2016   Procedure: COLONOSCOPY WITH PROPOFOL;  Surgeon: [name], MD;  Location: [place];  Service: Endoscopy;  Laterality: N/A;   [place]   POLYPECTOMY  07/04/2016   Procedure: POLYPECTOMY;  Surgeon: [name], MD;  Location: [place];  Service: Endoscopy;;   TONSILLECTOMY      There were no vitals filed for this visit.   Subjective Assessment - 08/15/21 1314     Subjective Patient was in court all morning and has been stressed. No falls or LOB since last session.    Pertinent History Patient presents to physical therapy for unsteadiness, walking instability, fatigue. His PMH includes paroxysmal a fib, MS, lymphedema, hypothyroidism, Graves Disease, arthritis, lumbar herniated disc (L5), depression and  neuropathy. Patient first developed symptoms of MS in 2005 and was diagnosed in 2006. Patient has done PT in the past, but hasn't been seen since 2020 at [facility]. Has not been doing his exercises in the past year. Walk outside to garage to ride lawnmower, walks in house. 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 a couple hours    How long can you stand comfortably? no more than 10 minutes before pain, have to steady self immediately    How long can you walk comfortably? has to use rollator. can walk in a store with a cart    Patient Stated Goals want to get stronger and feel mor comfortable walking around. Patient would like to continue strengthening hips and improve endurance and ability to pace self for safety while walking.    Currently in Pain? No/denies               BP at start of session: 122/79      INTERVENTIONS:    By support bar: CGA and use of gait belt unless otherwise stated.   Neuro re-ed   -Hangman x 2 games: 1st game- Patient performed with narrowed base of support on airex pad. 2nd game with feet staggered-  Mild unsteadiness yet no LOB.    Airex pad 4" step modified tandem stance 2x 30 seconds each LE       TherEx    -Step ups 4"  green step with Min UE support on rails x 12 reps each leg   -lateral step up/down 4" green step with UE support 12x each side  -Standing TKE on left LE with GTB x 12 reps. Patient reports as easy     -Dynamic marching (high knees) with UE support (mild unsteadiness yet no LOB) 10x each LE   -hip abduction 12x each LE BUE support   -Sit to stand with blue airex pad under right LE x 10 reps without UE support.     Pt educated throughout session about proper posture and technique with exercises. Improved exercise technique, movement at target joints, use of target muscles after min to  mod verbal, visual, tactile cues.   Note: Portions of this document were prepared using Dragon voice recognition software and although reviewed may contain unintentional dictation errors in syntax, grammar, or spelling.  Patient has occasional hyperextension of LLE with weightbearing this session requiring occasional cueing for correction.  He remains highly motivated an excellent candidate for continued physical therapy.  Improved ankle righting reactions is noted on unstable surfaces the session.  His vitals are required to be monitored throughout session to ensure therapeutic range.Patient will continue to benefit from skilled physical therapy intervention in order to improve his strength, ambulatory capacity, balance, and increase his overall function and safety with everyday ambulation                    PT Education - 08/15/21 1748     Education Details exercise technique, body mechanics    Person(s) Educated Patient    Methods Explanation;Demonstration;Tactile cues;Verbal cues    Comprehension Verbalized understanding;Returned demonstration;Verbal cues required;Tactile cues required              PT Short Term Goals - 07/19/21 1603       PT SHORT TERM GOAL #1   Title Patient will be independent in home exercise program to improve strength/mobility for better functional independence with ADLs.    Baseline 8/9: HEP to be given next session, 10/31: patient reports compliance with HEP and would like progressions added next session.    Time 4    Period Weeks    Status On-going    Target Date 08/16/21               PT Long Term Goals - 07/19/21 1603       PT LONG TERM GOAL #1   Title Patient will increase FOTO score to equal to or greater than 70%    to demonstrate statistically significant improvement in mobility and quality of life.    Baseline 8/9: 64%; risk adjusted 34%; 03/21/21 FOTO: 45; 04/24/21 FOTO: 49%    Time 12    Period Weeks    Status  Partially Met    Target Date 10/11/21      PT LONG TERM GOAL #2   Title Patient will increase Berg Balance score by > 6 points to demonstrate decreased fall risk during functional activities.    Baseline 8/9: BBT 29/56; 03/21/21: 42/56    Time 12    Period Weeks    Status Achieved      PT LONG TERM GOAL #3   Title Patient will increase 10 meter walk test to >1.25ms as to improve gait  speed for better community ambulation and to reduce fall risk.    Baseline 8/9: 0.65 m/s with rollator; 10/31: 0.68 m/s with rollator. 07/06/2021= 0.71 m/s using rollator    Time 12    Period Weeks    Status On-going    Target Date 10/11/21      PT LONG TERM GOAL #4   Title Patient will increase six minute walk test distance to >1000 for progression to community ambulator and improve gait ability    Baseline 8/9: 505 ft with rollator; 03/21/21: 745f c rollator, 04/24/21: 7551fc rollator; 07/06/2021=900 feet    Time 12    Period Weeks    Status On-going    Target Date 10/11/21      PT LONG TERM GOAL #5   Title Patient will increase glute medius strength on Left LE from 3-/5 to 4/5 to improve stability, gait mechanics, and functional strength.    Baseline 10/31: 3-/5; 07/06/2021= 3-/5    Time 12    Period Weeks    Status On-going    Target Date 10/11/21      PT LONG TERM GOAL #6   Title Patient will increase Left single leg stance time to 15 seconds or greater to increase safety in shower and independence with ADLs.    Baseline 10/31; 1.83 sec without UE support    Time 12    Period Weeks    Status New    Target Date 10/11/21                   Plan - 08/15/21 1750     Clinical Impression Statement Patient has occasional hyperextension of LLE with weightbearing this session requiring occasional cueing for correction.  He remains highly motivated an excellent candidate for continued physical therapy.  Improved ankle righting reactions is noted on unstable surfaces the session.  His vitals  are required to be monitored throughout session to ensure therapeutic range.Patient will continue to benefit from skilled physical therapy intervention in order to improve his strength, ambulatory capacity, balance, and increase his overall function and safety with everyday ambulation    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    Rehab Potential Fair    PT Frequency 2x / week    PT Duration 12 weeks    PT Treatment/Interventions ADLs/Self Care Home Management;Aquatic Therapy;Canalith Repostioning;Biofeedback;Cryotherapy;Ultrasound;Traction;Moist Heat;Electrical Stimulation;DME Instruction;Gait training;Therapeutic exercise;Therapeutic activities;Functional mobility training;Stair training;Balance training;Neuromuscular re-education;Patient/family education;Manual techniques;Orthotic Fit/Training;Compression bandaging;Passive range of motion;Vestibular;Taping;Splinting;Energy conservation;Dry needling;Visual/perceptual remediation/compensation    PT Next Visit Plan Continue with progessive balance training, Progressive LE strenthening as appropriate.    PT Home Exercise Plan Access Code: [email or web address]: [email or web address]  Added [place] with BTB and encouraged more walking around the home during commerical breaks when watching TV.    Consulted and Agree with Plan of Care Patient              Patient will benefit from skilled therapeutic intervention in order to improve the following deficits and impairments:  Abnormal gait, Cardiopulmonary status limiting activity, Decreased activity tolerance, Decreased coordination, Decreased balance, Decreased endurance, Decreased mobility, Decreased strength, Decreased range of motion, Difficulty walking, Impaired flexibility, Impaired sensation, Postural dysfunction, Improper body mechanics  Visit  Diagnosis: Abnormality of gait and mobility  Difficulty in walking, not elsewhere classified  Muscle weakness (generalized)  Unsteadiness on feet     Problem List Patient Active Problem List   Diagnosis Date Noted   Persistent atrial fibrillation (HCC)    Graves disease    Abdominal bloating    Atrial fibrillation with RVR ([place]) 10/01/2019   Hyperthyroidism    Atrial fibrillation with rapid ventricular response ([place]) 09/30/2019   Leg edema    Left leg cellulitis    Special screening for malignant neoplasms, colon    Polyp of sigmoid colon    Benign neoplasm of ascending colon    Rectal polyp    Herniated nucleus pulposus, L5-S1 11/09/2015   Low back pain 10/25/2015   Herniated nucleus pulposus, C5-6 right 10/06/2015   Dysesthesia 09/16/2015   Spasticity 09/16/2015   Unsteady gait 09/16/2015   Multiple sclerosis ([place]) 06/08/2011    Janna Arch, PT, DPT  08/15/2021, 5:51 PM  [place] MAIN [facility] SERVICES [address] [place], [place], [address] Phone: [phone]   Fax:  [phone]  Name: WORLEY RADERMACHER MRN: [id number] Date of Birth: 1955/09/28

## 2021-08-15 NOTE — Progress Notes (Signed)
Electrophysiology Office Note:    Date:  08/16/2021   ID:  Dustin, Gray 1955/10/19, MRN [id number]  PCP:  [name], MD  [facility] Cardiologist:  [name], MD  [facility] Electrophysiologist:  [name], MD   Referring MD: [name], MD   Chief Complaint: pAF  History of Present Illness:    Dustin Gray is a 66 y.o. male who presents for an evaluation of pAF at the request of Dr Dustin Gray. Their medical history includes MS, lymphedema, hypothyroidism/Graves' disease. He was seen by Dr Dustin Gray 07/31/2021. His hyperthyroidism is managed by endocrinology. He has significant stressors at home. He has previously undergone an unsuccessful DCCV. He takes eliquis for stroke ppx.   He was diagnosed with multiple sclerosis in 2006.  He was diagnosed with hyperthyroidism and atrial fibrillation in April 2022.  He takes Eliquis for stroke prophylaxis without bleeding issues.  He was going to have a cardioversion after his most recent appoint with Dr. Garen Gray but due to stressors at home this was canceled.    Past Medical History:  Diagnosis Date   Abscess    groin   Arthritis    lower spine   Erectile dysfunction    Low testosterone    Lumbar herniated disc    L5   Multiple sclerosis (HCC)    Staph aureus infection     Past Surgical History:  Procedure Laterality Date   COLONOSCOPY WITH PROPOFOL N/A 07/04/2016   Procedure: COLONOSCOPY WITH PROPOFOL;  Surgeon: [name], MD;  Location: [place];  Service: Endoscopy;  Laterality: N/A;   [place]   POLYPECTOMY  07/04/2016   Procedure: POLYPECTOMY;  Surgeon: [name], MD;  Location: [facility];  Service: Endoscopy;;   TONSILLECTOMY      Current Medications: Current Meds  Medication Sig   apixaban (ELIQUIS) 5 MG TABS tablet Take 1 tablet (5 mg total) by mouth 2 (two) times daily.   baclofen (LIORESAL) 10 MG tablet  Take 10 mg by mouth 3 (three) times daily.   diltiazem (CARDIZEM SR) 120 MG 12 hr capsule Take 1 capsule (120 mg total) by mouth 2 (two) times daily.   Dimethyl Fumarate 240 MG CPDR Take 240 mg by mouth 2 (two) times daily.    furosemide (LASIX) 40 MG tablet Take 1 tablet (40 mg total) by mouth as needed. With your potassium.   gabapentin (NEURONTIN) 300 MG capsule Take 300-600 mg by mouth See admin instructions. Take 1 capsule (300 mg) by mouth in the morning, take 1 capsule (300 mg) by mouth at midday, & take 2 capsules (600 mg) by mouth at bedtime.   meloxicam (MOBIC) 15 MG tablet Take 15 mg by mouth daily as needed for pain.    methimazole (TAPAZOLE) 10 MG tablet Take 10 mg by mouth in the morning.   potassium chloride (KLOR-CON) 10 MEQ tablet Take 1 tablet (10 mEq total) by mouth as needed. Take with your Lasix.   propranolol ER (INDERAL LA) 80 MG 24 hr capsule Take 1 capsule (80 mg total) by mouth 2 (two) times daily.     Allergies:   Cephalexin, Ancef [cefazolin sodium], Cefadroxil, and Cefazolin   Social History   Socioeconomic History   Marital status: Legally Separated    Spouse name: Not on file   Number of children: Not on file   Years of education: Not on file   Highest education  level: Not on file  Occupational History   Not on file  Tobacco Use   Smoking status: Former   Smokeless tobacco: Never   Tobacco comments:    smoked as teenager  Substance and Sexual Activity   Alcohol use: Yes    Comment: 2 drinks/month   Drug use: No   Sexual activity: Not on file  Other Topics Concern   Not on file  Social History Narrative   Not on file   Social Determinants of Health   Financial Resource Strain: Not on file  Food Insecurity: Not on file  Transportation Needs: Not on file  Physical Activity: Not on file  Stress: Not on file  Social Connections: Not on file     Family History: The patient's family history includes Diabetes in his father.  ROS:   Please see  the history of present illness.    All other systems reviewed and are negative.  EKGs/Labs/Other Studies Reviewed:    The following studies were reviewed today:  03/17/2020 heart monitor 100% AF, average rate 121 bpm  10/01/2019 Echo LV EF 55% RV normal  EKG:  The ekg ordered today demonstrates atrial fibrillation with a ventricular rate of 91 bpm.   Recent Labs: 07/31/2021: BUN 9; Creatinine, Ser 1.08; Hemoglobin 15.6; Platelets 216; Potassium 4.4; Sodium 143  Recent Lipid Panel    Component Value Date/Time   CHOL 108 10/01/2019 0358   TRIG 81 10/01/2019 0358   HDL 35 (L) 10/01/2019 0358   CHOLHDL 3.1 10/01/2019 0358   VLDL 16 10/01/2019 0358   LDLCALC 57 10/01/2019 0358    Physical Exam:    VS:  BP 108/80 (BP Location: Left Arm, Patient Position: Sitting, Cuff Size: Normal)    Pulse 91    Ht 6\' 2"  (1.88 m)    Wt 250 lb (113.4 kg)    SpO2 98%    BMI 32.10 kg/m     Wt Readings from Last 3 Encounters:  08/16/21 250 lb (113.4 kg)  07/31/21 253 lb (114.8 kg)  01/27/21 244 lb (110.7 kg)     GEN:  Well nourished, well developed in no acute distress.  Using rollator HEENT: Normal NECK: No JVD; No carotid bruits LYMPHATICS: No lymphadenopathy CARDIAC: Irregularly irregular, no murmurs, rubs, gallops RESPIRATORY:  Clear to auscultation without rales, wheezing or rhonchi  ABDOMEN: Soft, non-tender, non-distended MUSCULOSKELETAL:  No edema; left leg in Ace wrap SKIN: Warm and dry NEUROLOGIC:  Alert and oriented x 3 PSYCHIATRIC:  Normal affect       ASSESSMENT:    1. Persistent atrial fibrillation ([place])   2. Hyperthyroidism    PLAN:    In order of problems listed above:   #Persistent atrial fibrillation Minimally symptomatic but may be contributing to generalized fatigue.  On Eliquis for stroke prophylaxis.  We will plan to reschedule his cardioversion and get a surface echo to confirm normal left ventricular function.  Not a good candidate for PVI given  comorbidities.  If cardioversion fails, would plan for rate control as long as left ventricular function normal.  #Hypothyroidism Controlled on methimazole and propranolol.  Follows with endocrinology.  Follow-up 6 months with APP.    Medication Adjustments/Labs and Tests Ordered: Current medicines are reviewed at length with the patient today.  Concerns regarding medicines are outlined above.  No orders of the defined types were placed in this encounter.  No orders of the defined types were placed in this encounter.    Signed, Hilton Cork. Quentin Ore,  MD, [facility], [facility] 08/16/2021 11:28 AM    Electrophysiology [facility]

## 2021-08-16 ENCOUNTER — Encounter: Payer: Self-pay | Admitting: Cardiology

## 2021-08-16 ENCOUNTER — Encounter: Payer: Self-pay | Admitting: *Deleted

## 2021-08-16 ENCOUNTER — Ambulatory Visit: Payer: Medicare PPO | Admitting: Cardiology

## 2021-08-16 VITALS — BP 108/80 | HR 91 | Ht 74.0 in | Wt 250.0 lb

## 2021-08-16 DIAGNOSIS — I4819 Other persistent atrial fibrillation: Secondary | ICD-10-CM

## 2021-08-16 DIAGNOSIS — E0590 Thyrotoxicosis, unspecified without thyrotoxic crisis or storm: Secondary | ICD-10-CM

## 2021-08-16 NOTE — Patient Instructions (Addendum)
Medications: Your physician recommends that you continue on your current medications as directed. Please refer to the Current Medication list given to you today. *If you need a refill on your cardiac medications before your next appointment, please call your pharmacy*  Lab Work: CBC, BMP If you have labs (blood work) drawn today and your tests are completely normal, you will receive your results only by: [place] (if you have MyChart) OR A paper copy in the mail If you have any lab test that is abnormal or we need to change your treatment, we will call you to review the results.  Testing/Procedures: Your physician has requested that you have an echocardiogram. Echocardiography is a painless test that uses sound waves to create images of your heart. It provides your doctor with information about the size and shape of your heart and how well your hearts chambers and valves are working. This procedure takes approximately one hour. There are no restrictions for this procedure.   Follow-Up: At [facility], you and your health needs are our priority.  As part of our continuing mission to provide you with exceptional heart care, we have created designated Provider Care Teams.  These Care Teams include your primary Cardiologist (physician) and Advanced Practice Providers (APPs -  Physician Assistants and Nurse Practitioners) who all work together to provide you with the care you need, when you need it.  Your physician wants you to follow-up in: 6 months with  one of the following Advanced Practice Providers on your designated Care Team:    Ignacia Bayley, NP Christell Faith PA Cadence Kathlen Mody PA    You will receive a reminder letter in the mail two months in advance. If you don't receive a letter, please call our office to schedule the follow-up appointment.  We recommend signing up for the patient portal called "MyChart".  Sign up information is provided on this After Visit Summary.  MyChart is  used to connect with patients for Virtual Visits (Telemedicine).  Patients are able to view lab/test results, encounter notes, upcoming appointments, etc.  Non-urgent messages can be sent to your provider as well.   To learn more about what you can do with MyChart, go to [email or web address].    Any Other Special Instructions Will Be Listed Below (If Applicable).

## 2021-08-16 NOTE — Therapy (Signed)
[place] MAIN [facility] SERVICES [address] [place], [place], [address] Phone: [phone]   Fax:  [phone]  Occupational Therapy Treatment  Patient Details  Name: Dustin Gray MRN: [id number] Date of Birth: [birth year] Referring Provider (OT): Staci Acosta, MD   Encounter Date: 08/15/2021   OT End of Session - 08/15/21 1420     Visit Number 13   Number of Visits 36    Date for OT Re-Evaluation 08/23/21    OT Start Time 0210    OT Stop Time 0305    OT Time Calculation (min) 55 min    Activity Tolerance Patient tolerated treatment well;No increased pain    Behavior During Therapy WFL for tasks assessed/performed             Past Medical History:  Diagnosis Date   Abscess    groin   Arthritis    lower spine   Erectile dysfunction    Low testosterone    Lumbar herniated disc    L5   Multiple sclerosis (HCC)    Staph aureus infection     Past Surgical History:  Procedure Laterality Date   COLONOSCOPY WITH PROPOFOL N/A 07/04/2016   Procedure: COLONOSCOPY WITH PROPOFOL;  Surgeon: [name], MD;  Location: [place];  Service: Endoscopy;  Laterality: N/A;   [place]   POLYPECTOMY  07/04/2016   Procedure: POLYPECTOMY;  Surgeon: [name], MD;  Location: [place];  Service: Endoscopy;;   TONSILLECTOMY      There were no vitals filed for this visit.   Subjective Assessment - 08/16/21 1530     Subjective  Dustin Gray presents for OT visit 13/36 to address BLE lymphedema. Pt denies LE related leg pain. Pt agrees  that Architect may sit in on session.    Pertinent History relevant to LE: HTN, MS, Herniated C-5-C6, L5-S1, chronic low back pain, persistent Afib,Hx LLE cellulitis, Graves disease, HYPERthyroidism, OA    Limitations difficulty walking, impaired functional mobility and transfers, impaired balance, muscle weakness, L>R,, altered  sensation, chronic back pain , chronic leg swelling and associated pain, spasticity in legs    Repetition Increases Symptoms    Special Tests +Stemmer sign, L>R; Intake FOTO: 51/100    Patient Stated Goals Learn about lymphedema and explore treatment options    Pain Onset More than a month ago    Pain Onset More than a month ago                          OT Treatments/Exercises (OP) - 08/15/21 1418       ADLs   ADL Education Given Yes      Manual Therapy   Manual Therapy Edema management;Manual Lymphatic Drainage (MLD);Compression Bandaging    Manual Lymphatic Drainage (MLD) MLD to LLE/LLQ utilizing short neck sequence, diaphragmatic breathing, functional inguinal LN and proximal nto distal J strokes to each lower extremity segment. Fially repeat 3 retrograde sweeps to terminus and repeat short neck.    Compression Bandaging 4 layer knee length compression wrap , including foot , from base of toes to popliteal fossa using stockinett as base layer, then single layer Rosidal foam under 1 each 8,10 and 12 cm wide short stretch bandages.                    OT Education - 08/15/21 1420  Education Details Continued Pt/ CG edu for lymphedema self care  and home program throughout session. Topics include multilayer, gradient compression wrapping, simple self-MLD, therapeutic lymphatic pumping exercises, skin/nail care, risk reduction factors and LE precautions, compression garments/recommendations and wear and care schedule and compression garment donning / doffing using assistive devices. All questions answered to the Pt's satisfaction, and Pt demonstrates understanding by report.    Person(s) Educated Patient    Methods Explanation;Demonstration;Handout    Comprehension Verbalized understanding;Returned demonstration;Need further instruction                 OT Long Term Goals - 08/10/21 1319       OT LONG TERM GOAL #1   Title Given this patients Intake  score of 51/100 on the functional outcomes FOTO tool, patient will experience an increase in function of 5 points to improve basic and instrumental ADLs performance, including lymphedema self-care.    Baseline Max A    Time 12    Period Weeks    Status Partially Met   08/08/21 ( OT visit 9) increased 2 points tfrom 51/100 initially to 53100 today.     OT LONG TERM GOAL #2   Title Pt will be able to apply knee length, multi-layer, short stretch compression wraps to one limb at a time using gradient techniques with MAX CG ASSISTANCE to decrease limb volume, to limit infection risk, and to limit lymphedema progression.    Baseline Dependent    Time 4    Period Days    Status Achieved   Pt met and exceeded this goal as he is able to wrap independently     OT LONG TERM GOAL #3   Title Pt will demonstrate understanding of lymphedema prevention strategies by identifying and discussing 5 precautions using printed reference (modified assistance) to reduce risk of progression and to limit infection risk.    Baseline Max A    Time 4    Period Days    Status Achieved    Target Date --   4th OT Rx visit     OT LONG TERM GOAL #4   Title Pt will achieve at least a 10% limb volume reduction in bilateral legs to return limb to normal size and shape,  to limit lymphedema progression and to limit infection risk.    Baseline mAx    Time 12    Period Weeks    Status Partially Met   LLE reduced by 18.8 % in 10 visits.   Target Date 08/23/21      OT LONG TERM GOAL #5   Title With MAX CG ASSISTANCE Pt will achieve and sustain a least 85% compliance with all 4 LE self-care home program components throughout Intensive Phase CDT, including modified simple self-MLD, daily skin inspection and care, lymphatic pumping the ex, 23/7 compression wraps to sustain clinical gains made in CDT and to limit lymphedema progression and further functional decline.    Baseline Dependent    Time 12    Period Weeks    Status  Achieved   Met and exceeded goal. Pt modified independent (extra time) with all home program components and consistently 85% compliant w LE self care   Target Date 08/23/21                   Plan - 08/16/21 1518     Clinical Impression Statement Pt tolerated  LLE/LLQ MLD wo increased pain. Limb volume [place] very well managed and Pt compliance with  LE self care has recently improved with change in home situation. Pt ready for garment fitting as soon as order is complete from DME vendor. Skin condition much improved.. Cont as per POC.    OT Occupational Profile and History Comprehensive Assessment- Review of records and extensive additional review of physical, cognitive, psychosocial history related to current functional performance    Occupational performance deficits (Please refer to evaluation for details): ADL's;IADL's;Work;Leisure;Social Participation;Other   role performance   Body Structure / Function / Physical Skills ADL;Edema;Skin integrity;Flexibility;Pain;ROM;Decreased knowledge of use of DME;Scar mobility;IADL    Rehab Potential Good    Clinical Decision Making Several treatment options, min-mod task modification necessary    Comorbidities Affecting Occupational Performance: Presence of comorbidities impacting occupational performance    Modification or Assistance to Complete Evaluation  Min-Moderate modification of tasks or assist with assess necessary to complete eval    OT Frequency 2x / week    OT Duration 12 weeks   and PRN for follow along   OT Treatment/Interventions Self-care/ADL training;Therapeutic exercise;Manual Therapy;Coping strategies training;Therapeutic activities;Manual lymph drainage;DME and/or AE instruction;Compression bandaging;Other (comment);Patient/family education   skin care to limit infection risk   Plan Complete Decongestive Therapy (CDT) One leg at a time to limit fall LLE first. Manual lymphatic drainage (MLD), skin care, ther ex, compression wraps,  then    OT Home Exercise Plan Pt verbalized understanding that he requires assistance with all lymphedema home care components, especially compression wrapping, between visits for optimal prognosis. Without assistance prognosis is poor    Recommended Other Services In insurance benefits available, garmentsConsider advanced sequential pneumatic compression device (Flexitouch) to maximize independence w lymphedema self-care at home over time. Pt unable to perform simple self-mld    Consulted and Agree with Plan of Care Patient             Patient will benefit from skilled therapeutic intervention in order to improve the following deficits and impairments:   Body Structure / Function / Physical Skills: ADL, Edema, Skin integrity, Flexibility, Pain, ROM, Decreased knowledge of use of DME, Scar mobility, IADL       Visit Diagnosis: Lymphedema, not elsewhere classified    Problem List Patient Active Problem List   Diagnosis Date Noted   Persistent atrial fibrillation (HCC)    Graves disease    Abdominal bloating    Atrial fibrillation with RVR ([place]) 10/01/2019   Hyperthyroidism    Atrial fibrillation with rapid ventricular response ([place]) 09/30/2019   Leg edema    Left leg cellulitis    Special screening for malignant neoplasms, colon    Polyp of sigmoid colon    Benign neoplasm of ascending colon    Rectal polyp    Herniated nucleus pulposus, L5-S1 11/09/2015   Low back pain 10/25/2015   Herniated nucleus pulposus, C5-6 right 10/06/2015   Dysesthesia 09/16/2015   Spasticity 09/16/2015   Unsteady gait 09/16/2015   Multiple sclerosis ([place]) 06/08/2011    Andrey Spearman, MS, OTR/L, [facility] 08/16/21 3:33 PM   [place] MAIN [facility] SERVICES [address] [place], [place], [address] Phone: [phone]   Fax:  [phone]  Name: Dustin Gray MRN: [id number] Date of Birth: [birth year]

## 2021-08-16 NOTE — Addendum Note (Signed)
Addended by: Darrell Jewel on: 08/16/2021 12:13 PM   Modules accepted: Orders

## 2021-08-17 ENCOUNTER — Ambulatory Visit: Payer: Medicare PPO | Admitting: Occupational Therapy

## 2021-08-17 ENCOUNTER — Ambulatory Visit: Payer: Medicare PPO

## 2021-08-17 ENCOUNTER — Other Ambulatory Visit: Payer: Self-pay

## 2021-08-17 DIAGNOSIS — I890 Lymphedema, not elsewhere classified: Secondary | ICD-10-CM | POA: Diagnosis not present

## 2021-08-17 DIAGNOSIS — M6281 Muscle weakness (generalized): Secondary | ICD-10-CM

## 2021-08-17 DIAGNOSIS — R262 Difficulty in walking, not elsewhere classified: Secondary | ICD-10-CM | POA: Diagnosis not present

## 2021-08-17 DIAGNOSIS — R2681 Unsteadiness on feet: Secondary | ICD-10-CM | POA: Diagnosis not present

## 2021-08-17 DIAGNOSIS — R269 Unspecified abnormalities of gait and mobility: Secondary | ICD-10-CM

## 2021-08-17 LAB — BASIC METABOLIC PANEL
BUN/Creatinine Ratio: 8 — ABNORMAL LOW (ref 10–24)
BUN: 8 mg/dL (ref 8–27)
CO2: 25 mmol/L (ref 20–29)
Calcium: 9.6 mg/dL (ref 8.6–10.2)
Chloride: 102 mmol/L (ref 96–106)
Creatinine, Ser: 0.95 mg/dL (ref 0.76–1.27)
Glucose: 78 mg/dL (ref 70–99)
Potassium: 4.7 mmol/L (ref 3.5–5.2)
Sodium: 140 mmol/L (ref 134–144)
eGFR: 89 mL/min/{1.73_m2} (ref >59)

## 2021-08-17 LAB — CBC
Hematocrit: 48.9 % (ref 37.5–51.0)
Hemoglobin: 16.8 g/dL (ref 13.0–17.7)
MCH: 30.2 pg (ref 26.6–33.0)
MCHC: 34.4 g/dL (ref 31.5–35.7)
MCV: 88 fL (ref 79–97)
Platelets: 228 10*3/uL (ref 150–450)
RBC: 5.56 x10E6/uL (ref 4.14–5.80)
RDW: 13.5 % (ref 11.6–15.4)
WBC: 5.8 10*3/uL (ref 3.4–10.8)

## 2021-08-17 NOTE — Therapy (Signed)
[place] MAIN [facility] SERVICES [address] [place], [place], [address] Phone: [phone]   Fax:  [phone]  Occupational Therapy Treatment  Patient Details  Name: Dustin Gray MRN: [id number] Date of Birth: 02/05/1956 Referring Provider (OT): Staci Acosta, MD   Encounter Date: 08/17/2021   OT End of Session - 08/17/21 1314     Visit Number 12    Number of Visits 36    Date for OT Re-Evaluation 08/23/21    OT Start Time 0105    OT Stop Time 0200    OT Time Calculation (min) 55 min    Activity Tolerance Patient tolerated treatment well;No increased pain    Behavior During Therapy WFL for tasks assessed/performed             Past Medical History:  Diagnosis Date   Abscess    groin   Arthritis    lower spine   Erectile dysfunction    Low testosterone    Lumbar herniated disc    L5   Multiple sclerosis (HCC)    Staph aureus infection     Past Surgical History:  Procedure Laterality Date   COLONOSCOPY WITH PROPOFOL N/A 07/04/2016   Procedure: COLONOSCOPY WITH PROPOFOL;  Surgeon: [name], MD;  Location: [place];  Service: Endoscopy;  Laterality: N/A;   [place]   POLYPECTOMY  07/04/2016   Procedure: POLYPECTOMY;  Surgeon: [name], MD;  Location: [place];  Service: Endoscopy;;   TONSILLECTOMY      There were no vitals filed for this visit.   Subjective Assessment - 08/17/21 1315     Subjective  Dustin Gray presents for OT visit 14/36 to address BLE lymphedema. Pt denies LE related leg pain. Pt has no new concerns today.    Pertinent History relevant to LE: HTN, MS, Herniated C-5-C6, L5-S1, chronic low back pain, persistent Afib,Hx LLE cellulitis, Graves disease, HYPERthyroidism, OA    Limitations difficulty walking, impaired functional mobility and transfers, impaired balance, muscle weakness, L>R,, altered sensation, chronic back pain ,  chronic leg swelling and associated pain, spasticity in legs    Repetition Increases Symptoms    Special Tests +Stemmer sign, L>R; Intake FOTO: 51/100    Patient Stated Goals Learn about lymphedema and explore treatment options    Pain Score 0-No pain    Pain Onset More than a month ago    Pain Onset More than a month ago                          OT Treatments/Exercises (OP) - 08/17/21 1503       ADLs   ADL Education Given Yes      Manual Therapy   Manual Therapy Edema management;Manual Lymphatic Drainage (MLD);Compression Bandaging    Manual Lymphatic Drainage (MLD) MLD to LLE/LLQ utilizing short neck sequence, diaphragmatic breathing, functional inguinal LN and proximal nto distal J strokes to each lower extremity segment. Fially repeat 3 retrograde sweeps to terminus and repeat short neck.    Compression Bandaging 4 layer knee length compression wrap , including foot , from base of toes to popliteal fossa using stockinett as base layer, then single layer Rosidal foam under 1 each 8,10 and 12 cm wide short stretch bandages.                    OT Education - 08/17/21 1504  Education Details Continued Pt/ CG edu for lymphedema self care  and home program throughout session. Topics include multilayer, gradient compression wrapping, simple self-MLD, therapeutic lymphatic pumping exercises, skin/nail care, risk reduction factors and LE precautions, compression garments/recommendations and wear and care schedule and compression garment donning / doffing using assistive devices. All questions answered to the Pt's satisfaction, and Pt demonstrates understanding by report.    Person(s) Educated Patient    Methods Explanation;Demonstration;Handout    Comprehension Verbalized understanding;Returned demonstration;Need further instruction                 OT Long Term Goals - 08/10/21 1319       OT LONG TERM GOAL #1   Title Given this patients Intake score  of 51/100 on the functional outcomes FOTO tool, patient will experience an increase in function of 5 points to improve basic and instrumental ADLs performance, including lymphedema self-care.    Baseline Max A    Time 12    Period Weeks    Status Partially Met   08/08/21 ( OT visit 9) increased 2 points tfrom 51/100 initially to 53100 today.     OT LONG TERM GOAL #2   Title Pt will be able to apply knee length, multi-layer, short stretch compression wraps to one limb at a time using gradient techniques with MAX CG ASSISTANCE to decrease limb volume, to limit infection risk, and to limit lymphedema progression.    Baseline Dependent    Time 4    Period Days    Status Achieved   Pt met and exceeded this goal as he is able to wrap independently     OT LONG TERM GOAL #3   Title Pt will demonstrate understanding of lymphedema prevention strategies by identifying and discussing 5 precautions using printed reference (modified assistance) to reduce risk of progression and to limit infection risk.    Baseline Max A    Time 4    Period Days    Status Achieved    Target Date --   4th OT Rx visit     OT LONG TERM GOAL #4   Title Pt will achieve at least a 10% limb volume reduction in bilateral legs to return limb to normal size and shape,  to limit lymphedema progression and to limit infection risk.    Baseline mAx    Time 12    Period Weeks    Status Partially Met   LLE reduced by 18.8 % in 10 visits.   Target Date 08/23/21      OT LONG TERM GOAL #5   Title With MAX CG ASSISTANCE Pt will achieve and sustain a least 85% compliance with all 4 LE self-care home program components throughout Intensive Phase CDT, including modified simple self-MLD, daily skin inspection and care, lymphatic pumping the ex, 23/7 compression wraps to sustain clinical gains made in CDT and to limit lymphedema progression and further functional decline.    Baseline Dependent    Time 12    Period Weeks    Status Achieved    Met and exceeded goal. Pt modified independent (extra time) with all home program components and consistently 85% compliant w LE self care   Target Date 08/23/21                   Plan - 08/17/21 1502     Clinical Impression Statement Pt tolerated  LLE/LLQ MLD wo increased pain. Reapplied compresion wraps. Measure for custom garment next visit.  OT Occupational Profile and History Comprehensive Assessment- Review of records and extensive additional review of physical, cognitive, psychosocial history related to current functional performance    Occupational performance deficits (Please refer to evaluation for details): ADL's;IADL's;Work;Leisure;Social Participation;Other   role performance   Body Structure / Function / Physical Skills ADL;Edema;Skin integrity;Flexibility;Pain;ROM;Decreased knowledge of use of DME;Scar mobility;IADL    Rehab Potential Good    Clinical Decision Making Several treatment options, min-mod task modification necessary    Comorbidities Affecting Occupational Performance: Presence of comorbidities impacting occupational performance    Modification or Assistance to Complete Evaluation  Min-Moderate modification of tasks or assist with assess necessary to complete eval    OT Frequency 2x / week    OT Duration 12 weeks   and PRN for follow along   OT Treatment/Interventions Self-care/ADL training;Therapeutic exercise;Manual Therapy;Coping strategies training;Therapeutic activities;Manual lymph drainage;DME and/or AE instruction;Compression bandaging;Other (comment);Patient/family education   skin care to limit infection risk   Plan Complete Decongestive Therapy (CDT) One leg at a time to limit fall LLE first. Manual lymphatic drainage (MLD), skin care, ther ex, compression wraps, then    OT Home Exercise Plan Pt verbalized understanding that he requires assistance with all lymphedema home care components, especially compression wrapping, between visits for optimal  prognosis. Without assistance prognosis is poor    Recommended Other Services In insurance benefits available, garmentsConsider advanced sequential pneumatic compression device (Flexitouch) to maximize independence w lymphedema self-care at home over time. Pt unable to perform simple self-mld    Consulted and Agree with Plan of Care Patient             Patient will benefit from skilled therapeutic intervention in order to improve the following deficits and impairments:   Body Structure / Function / Physical Skills: ADL, Edema, Skin integrity, Flexibility, Pain, ROM, Decreased knowledge of use of DME, Scar mobility, IADL       Visit Diagnosis: Lymphedema, not elsewhere classified    Problem List Patient Active Problem List   Diagnosis Date Noted   Persistent atrial fibrillation (HCC)    Graves disease    Abdominal bloating    Atrial fibrillation with RVR ([place]) 10/01/2019   Hyperthyroidism    Atrial fibrillation with rapid ventricular response ([place]) 09/30/2019   Leg edema    Left leg cellulitis    Special screening for malignant neoplasms, colon    Polyp of sigmoid colon    Benign neoplasm of ascending colon    Rectal polyp    Herniated nucleus pulposus, L5-S1 11/09/2015   Low back pain 10/25/2015   Herniated nucleus pulposus, C5-6 right 10/06/2015   Dysesthesia 09/16/2015   Spasticity 09/16/2015   Unsteady gait 09/16/2015   Multiple sclerosis ([place]) 06/08/2011    Andrey Spearman, MS, OTR/L, [facility] 08/17/21 3:05 PM   [place] MAIN [facility] SERVICES [address] [place], [place], [address] Phone: [phone]   Fax:  [phone]  Name: Dustin Gray MRN: [id number] Date of Birth: 08/03/1955

## 2021-08-17 NOTE — Patient Instructions (Signed)

## 2021-08-17 NOTE — Therapy (Signed)
[place] MAIN [facility] SERVICES [address] [place], [place], [address] Phone: [phone]   Fax:  [phone]  Physical Therapy Treatment  Patient Details  Name: Dustin Gray MRN: [id number] Date of Birth: 01-23-1956 No data recorded  Encounter Date: 08/17/2021   PT End of Session - 08/17/21 1348     Visit Number 39    Number of Visits 43    Date for PT Re-Evaluation 10/11/21    Authorization Type [employer]    Authorization Time Period 01/31/21-04/25/21    PT Start Time 9242    PT Stop Time 1445    PT Time Calculation (min) 42 min    Equipment Utilized During Treatment Gait belt    Activity Tolerance Patient tolerated treatment well;No increased pain;Patient limited by fatigue    Behavior During Therapy [facility] for tasks assessed/performed             Past Medical History:  Diagnosis Date   Abscess    groin   Arthritis    lower spine   Erectile dysfunction    Low testosterone    Lumbar herniated disc    L5   Multiple sclerosis (HCC)    Staph aureus infection     Past Surgical History:  Procedure Laterality Date   COLONOSCOPY WITH PROPOFOL N/A 07/04/2016   Procedure: COLONOSCOPY WITH PROPOFOL;  Surgeon: [name], MD;  Location: [place];  Service: Endoscopy;  Laterality: N/A;   [place]   POLYPECTOMY  07/04/2016   Procedure: POLYPECTOMY;  Surgeon: [name], MD;  Location: [place];  Service: Endoscopy;;   TONSILLECTOMY      There were no vitals filed for this visit.   Subjective Assessment - 08/17/21 1347     Subjective Patient reports doing okay. Going to go to beach and see his mother this weekend. Has to go for cardioversion next Friday.    Pertinent History Patient presents to physical therapy for unsteadiness, walking instability, fatigue. His PMH includes paroxysmal a fib, MS, lymphedema, hypothyroidism, Graves Disease, arthritis, lumbar herniated  disc (L5), depression and neuropathy. Patient first developed symptoms of MS in 2005 and was diagnosed in 2006. Patient has done PT in the past, but hasn't been seen since 2020 at [facility]. Has not been doing his exercises in the past year. Walk outside to garage to ride lawnmower, walks in house. 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 a couple hours    How long can you stand comfortably? no more than 10 minutes before pain, have to steady self immediately    How long can you walk comfortably? has to use rollator. can walk in a store with a cart    Patient Stated Goals want to get stronger and feel mor comfortable walking around. Patient would like to continue strengthening hips and improve endurance and ability to pace self for safety while walking.    Currently in Pain? No/denies              INTERVENTIONS:   VITALS- 97% O2 sat; HR= 79 bpm BP= Right UE at rest (after rest for 2 min following 70 feet walk) 103/85 mmHg  Therapeutic exercises   ambulation in // bars -  forward stepping over 5 1/2 foam rolls positioned in sequence x 6 trials -  Patient able to clear foam well with increased concentration on left LE  Side stepping in // bars- over 5 1/2 foam rolls positioned in sequence x 6 trials- Mild difficulty going to left side due to weakness yet able to clear obstacles with increased concentration.   Forward/Retro (concentrating more on retro) over lined theraband on floor- Min UE support and decreased hip ext - did improve with practice.   TKE- using GTB x 20 reps TKE with step up on 2" step x 20 reps  Sit to stand with right LE compromised (foot on airex pad) x 12 reps   Education provided throughout session via VC/TC and demonstration to facilitate movement at target joints and correct muscle activation for all testing and exercises  performed.                   PT Education - 08/17/21 1348     Education Details Exercise technique, Body mechanics    Person(s) Educated Patient    Methods Explanation;Demonstration;Tactile cues;Verbal cues    Comprehension Verbalized understanding;Returned demonstration;Verbal cues required;Tactile cues required;Need further instruction              PT Short Term Goals - 07/19/21 1603       PT SHORT TERM GOAL #1   Title Patient will be independent in home exercise program to improve strength/mobility for better functional independence with ADLs.    Baseline 8/9: HEP to be given next session, 10/31: patient reports compliance with HEP and would like progressions added next session.    Time 4    Period Weeks    Status On-going    Target Date 08/16/21               PT Long Term Goals - 07/19/21 1603       PT LONG TERM GOAL #1   Title Patient will increase FOTO score to equal to or greater than 70%    to demonstrate statistically significant improvement in mobility and quality of life.    Baseline 8/9: 64%; risk adjusted 34%; 03/21/21 FOTO: 45; 04/24/21 FOTO: 49%    Time 12    Period Weeks    Status Partially Met    Target Date 10/11/21      PT LONG TERM GOAL #2   Title Patient will increase Berg Balance score by > 6 points to demonstrate decreased fall risk during functional activities.    Baseline 8/9: BBT 29/56; 03/21/21: 42/56    Time 12    Period Weeks    Status Achieved      PT LONG TERM GOAL #3   Title Patient will increase 10 meter walk test to >1.29ms as to improve gait speed for better community ambulation and to reduce fall risk.    Baseline 8/9: 0.65 m/s with rollator; 10/31: 0.68 m/s with rollator. 07/06/2021= 0.71 m/s using rollator    Time 12    Period Weeks    Status On-going    Target Date 10/11/21      PT LONG TERM GOAL #4   Title Patient will increase six minute walk test distance to >1000 for progression to community ambulator  and improve gait ability    Baseline 8/9: 505 ft with rollator; 03/21/21: 7736fc rollator, 04/24/21: 7559f rollator; 07/06/2021=900 feet    Time 12    Period Weeks    Status On-going    Target Date 10/11/21      PT LONG TERM GOAL #5   Title  Patient will increase glute medius strength on Left LE from 3-/5 to 4/5 to improve stability, gait mechanics, and functional strength.    Baseline 10/31: 3-/5; 07/06/2021= 3-/5    Time 12    Period Weeks    Status On-going    Target Date 10/11/21      PT LONG TERM GOAL #6   Title Patient will increase Left single leg stance time to 15 seconds or greater to increase safety in shower and independence with ADLs.    Baseline 10/31; 1.83 sec without UE support    Time 12    Period Weeks    Status New    Target Date 10/11/21                   Plan - 08/17/21 1349     Clinical Impression Statement Patient presented with good motivation today and experience less hyperextension of left knee with all activities today. He continues to be limited with left LE yet able to lift LE well enough to clear obstacles well today. Patient will continue to benefit from skilled physical therapy intervention in order to improve his strength, ambulatory capacity, balance, and increase his overall function and safety with everyday ambulation    Personal Factors and Comorbidities Age;Comorbidity 3+;Fitness;Past/Current Experience;Time since onset of injury/illness/exacerbation    Comorbidities aroxysmal a fib, MS, lymphedema, hypothyroidism, Graves Disease, arthritis, lumbar herniated disc (L5), depression and neuropathy    Examination-Activity Limitations Bed Mobility;Bend;Caring for Others;Carry;Reach Overhead;Locomotion Level;Lift;Hygiene/Grooming;Dressing;Squat;Stairs;Stand;Toileting;Transfers    Examination-Participation Restrictions Cleaning;Community Activity;Laundry;Occupation;Shop;Volunteer;Yard Work    [employer]chant [employer] officer Evolving/Moderate  complexity    Rehab Potential Fair    PT Frequency 2x / week    PT Duration 12 weeks    PT Treatment/Interventions ADLs/Self Care Home Management;Aquatic Therapy;Canalith Repostioning;Biofeedback;Cryotherapy;Ultrasound;Traction;Moist Heat;Electrical Stimulation;DME Instruction;Gait training;Therapeutic exercise;Therapeutic activities;Functional mobility training;Stair training;Balance training;Neuromuscular re-education;Patient/family education;Manual techniques;Orthotic Fit/Training;Compression bandaging;Passive range of motion;Vestibular;Taping;Splinting;Energy conservation;Dry needling;Visual/perceptual remediation/compensation    PT Next Visit Plan Continue with progessive balance training, Progressive LE strenthening as appropriate.    PT Home Exercise Plan Access Code: [email or web address] URL: [email or web address]  Added Bridges with BTB and encouraged more walking around the home during commerical breaks when watching TV.    Consulted and Agree with Plan of Care Patient             Patient will benefit from skilled therapeutic intervention in order to improve the following deficits and impairments:  Abnormal gait, Cardiopulmonary status limiting activity, Decreased activity tolerance, Decreased coordination, Decreased balance, Decreased endurance, Decreased mobility, Decreased strength, Decreased range of motion, Difficulty walking, Impaired flexibility, Impaired sensation, Postural dysfunction, Improper body mechanics  Visit Diagnosis: Abnormality of gait and mobility  Difficulty in walking, not elsewhere classified  Muscle weakness (generalized)  Unsteadiness on feet     Problem List Patient Active Problem List   Diagnosis Date Noted   Persistent atrial fibrillation (HCC)    Graves disease    Abdominal bloating    Atrial fibrillation with RVR ([place]) 10/01/2019   Hyperthyroidism    Atrial fibrillation with rapid ventricular response ([place]) 09/30/2019   Leg edema     Left leg cellulitis    Special screening for malignant neoplasms, colon    Polyp of sigmoid colon    Benign neoplasm of ascending colon    Rectal polyp    Herniated nucleus pulposus, L5-S1 11/09/2015   Low back pain 10/25/2015   Herniated nucleus pulposus, C5-6 right 10/06/2015   Dysesthesia 09/16/2015   Spasticity 09/16/2015   Unsteady gait 09/16/2015  Multiple sclerosis ([place]) 06/08/2011    Lewis Moccasin, PT 08/17/2021, 5:31 PM  [place] MAIN [facility] SERVICES [address] [place], [place], [address] Phone: [phone]   Fax:  [phone]  Name: SAHMIR WEATHERBEE MRN: [id number] Date of Birth: [birth year]

## 2021-08-17 NOTE — Telephone Encounter (Signed)
Patient seen in office yesterday by Dr. Quentin Ore and his Cardioversion has been rescheduled for 08/25/21. Closing encounter.

## 2021-08-22 ENCOUNTER — Other Ambulatory Visit: Payer: Self-pay

## 2021-08-22 ENCOUNTER — Ambulatory Visit: Payer: Medicare PPO

## 2021-08-22 ENCOUNTER — Ambulatory Visit: Payer: Medicare PPO | Admitting: Occupational Therapy

## 2021-08-22 DIAGNOSIS — I890 Lymphedema, not elsewhere classified: Secondary | ICD-10-CM | POA: Diagnosis not present

## 2021-08-22 DIAGNOSIS — R269 Unspecified abnormalities of gait and mobility: Secondary | ICD-10-CM | POA: Diagnosis not present

## 2021-08-22 DIAGNOSIS — R2681 Unsteadiness on feet: Secondary | ICD-10-CM | POA: Diagnosis not present

## 2021-08-22 DIAGNOSIS — M6281 Muscle weakness (generalized): Secondary | ICD-10-CM

## 2021-08-22 DIAGNOSIS — R262 Difficulty in walking, not elsewhere classified: Secondary | ICD-10-CM | POA: Diagnosis not present

## 2021-08-22 NOTE — Patient Instructions (Signed)

## 2021-08-22 NOTE — Therapy (Signed)
[place] MAIN [facility] SERVICES [address] [place], [place], [address] Phone: [phone]   Fax:  [phone]  Physical Therapy Treatment/Physical Therapy Progress Note   Dates of reporting period 07/06/2021   to   08/22/2021  Patient Details  Name: Dustin Gray MRN: [id number] Date of Birth: [birth year] No data recorded  Encounter Date: 08/22/2021   PT End of Session - 08/22/21 1350     Visit Number 40    Number of Visits 43    Date for PT Re-Evaluation 10/11/21    Authorization Type [employer]    Authorization Time Period 01/31/21-04/25/21    PT Start Time 1359    PT Stop Time 1444    PT Time Calculation (min) 45 min    Equipment Utilized During Treatment Gait belt    Activity Tolerance Patient tolerated treatment well;No increased pain;Patient limited by fatigue    Behavior During Therapy [facility] for tasks assessed/performed             Past Medical History:  Diagnosis Date   Abscess    groin   Arthritis    lower spine   Erectile dysfunction    Low testosterone    Lumbar herniated disc    L5   Multiple sclerosis (HCC)    Staph aureus infection     Past Surgical History:  Procedure Laterality Date   COLONOSCOPY WITH PROPOFOL N/A 07/04/2016   Procedure: COLONOSCOPY WITH PROPOFOL;  Surgeon: [name], MD;  Location: [place];  Service: Endoscopy;  Laterality: N/A;   [place]   POLYPECTOMY  07/04/2016   Procedure: POLYPECTOMY;  Surgeon: [name], MD;  Location: [place];  Service: Endoscopy;;   TONSILLECTOMY      There were no vitals filed for this visit.   Subjective Assessment - 08/22/21 1349     Subjective Patient reports doing okay with no new complaints. Reports he is scheduled for a cardioversion on Friday.    Pertinent History Patient presents to physical therapy for unsteadiness, walking instability, fatigue. His PMH includes paroxysmal a fib, MS,  lymphedema, hypothyroidism, Graves Disease, arthritis, lumbar herniated disc (L5), depression and neuropathy. Patient first developed symptoms of MS in 2005 and was diagnosed in 2006. Patient has done PT in the past, but hasn't been seen since 2020 at [facility]. Has not been doing his exercises in the past year. Walk outside to garage to ride lawnmower, walks in house. 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 a couple hours    How long can you stand comfortably? no more than 10 minutes before pain, have to steady self immediately    How long can you walk comfortably? has to use rollator. can walk in a store with a cart    Patient Stated Goals want to get stronger and feel mor comfortable walking around. Patient would like to continue strengthening hips and improve endurance and ability to pace self for safety while walking.    Currently in Pain? No/denies            INTERVENTIONS:    Reassessed goals for Progress note (Visit 60) today:   Short term goal- Ind with HEP = Patient verbalized that he is walking and doing his LE exercises with no questions at this time. (GOAL MET)  Long Term Goals:   FOTO= 56% (Improved from 49% on 04/24/2021)  10 Meter Walk Test=  (10.68 sec avg of 2 trials) = 0.94 m/s with rollator  (0.71 m/s using rollator)   6 min walk test= 935 feet  (Improved from 900 feet on 1/12)  Glute med strength= 3-/5 unable to achieve full ROM yet able to withstand some resistance with testing  (3-/5 on 1/12)   Single leg stance test =  2-3  sec  on left; 6 sec on right  (1.83 sec without UE support)                                 PT Education - 08/22/21 1349     Education Details Exercise technique    Person(s) Educated Patient    Methods Explanation;Demonstration;Tactile cues;Verbal cues     Comprehension Verbalized understanding;Returned demonstration;Verbal cues required;Tactile cues required;Need further instruction              PT Short Term Goals - 08/22/21 1358       PT SHORT TERM GOAL #1   Title Patient will be independent in home exercise program to improve strength/mobility for better functional independence with ADLs.    Baseline 8/9: HEP to be given next session, 10/31: patient reports compliance with HEP and would like progressions added next session. 08/22/2021=Patient verbalized that he is walking and doing his LE exercises with no questions at this time.    Time 4    Period Weeks    Status Achieved    Target Date 08/16/21               PT Long Term Goals - 08/22/21 1355       PT LONG TERM GOAL #1   Title Patient will increase FOTO score to equal to or greater than 70%    to demonstrate statistically significant improvement in mobility and quality of life.    Baseline 8/9: 64%; risk adjusted 34%; 03/21/21 FOTO: 45; 04/24/21 FOTO: 49% ; 08/22/2021= 56%    Time 12    Period Weeks    Status Partially Met    Target Date 10/11/21      PT LONG TERM GOAL #2   Title Patient will increase Berg Balance score by > 6 points to demonstrate decreased fall risk during functional activities.    Baseline 8/9: BBT 29/56; 03/21/21: 42/56    Time 12    Period Weeks    Status Achieved      PT LONG TERM GOAL #3   Title Patient will increase 10 meter walk test to >1.87ms as to improve gait speed for better community ambulation and to reduce fall risk.    Baseline 8/9: 0.65 m/s with rollator; 10/31: 0.68 m/s with rollator. 07/06/2021= 0.71 m/s using rollator; 08/22/2021= 0.94 m/s using rollator.    Time 12    Period Weeks    Status On-going    Target Date 10/11/21      PT LONG TERM GOAL #4   Title Patient will increase six minute walk test distance to >1000 for progression to community ambulator and improve gait ability    Baseline 8/9: 505 ft with rollator;  03/21/21: 7733fc rollator, 04/24/21: 75576f rollator; 07/06/2021=900 feet; 08/22/2021= 935 feet with use of Rollator    Time 12    Period Weeks    Status On-going    Target Date 10/11/21  PT LONG TERM GOAL #5   Title Patient will increase glute medius strength on Left LE from 3-/5 to 4/5 to improve stability, gait mechanics, and functional strength.    Baseline 10/31: 3-/5; 07/06/2021= 3-/5; 08/22/2021=3-/5 unable to achieve full ROM yet able to withstand some resistance with testing    Time 12    Period Weeks    Status On-going    Target Date 10/11/21      PT LONG TERM GOAL #6   Title Patient will increase Left single leg stance time to 15 seconds or greater to increase safety in shower and independence with ADLs.    Baseline 10/31; 1.83 sec without UE support; 08/22/2021=2-3  sec  on left; 6 sec on right    Time 12    Period Weeks    Status On-going    Target Date 10/11/21                   Plan - 08/22/21 1350     Clinical Impression Statement Patient presents today with good motivation for testing progress. He has demonstrated significant progress overall including meeting his short term goal of independence with HEP. He is also demo progress with functional mobility including 10 meter walk test, 6 min walk test, and single leg test. Patient's condition has the potential to improve in response to therapy. Maximum improvement is yet to be obtained. The anticipated improvement is attainable and reasonable in a generally predictable time.    Personal Factors and Comorbidities Age;Comorbidity 3+;Fitness;Past/Current Experience;Time since onset of injury/illness/exacerbation    Comorbidities aroxysmal a fib, MS, lymphedema, hypothyroidism, Graves Disease, arthritis, lumbar herniated disc (L5), depression and neuropathy    Examination-Activity Limitations Bed Mobility;Bend;Caring for Others;Carry;Reach Overhead;Locomotion  Level;Lift;Hygiene/Grooming;Dressing;Squat;Stairs;Stand;Toileting;Transfers    Examination-Participation Restrictions Cleaning;Community Activity;Laundry;Occupation;Shop;Volunteer;Yard Work    [employer]chant [employer] officer Evolving/Moderate complexity    Rehab Potential Fair    PT Frequency 2x / week    PT Duration 12 weeks    PT Treatment/Interventions ADLs/Self Care Home Management;Aquatic Therapy;Canalith Repostioning;Biofeedback;Cryotherapy;Ultrasound;Traction;Moist Heat;Electrical Stimulation;DME Instruction;Gait training;Therapeutic exercise;Therapeutic activities;Functional mobility training;Stair training;Balance training;Neuromuscular re-education;Patient/family education;Manual techniques;Orthotic Fit/Training;Compression bandaging;Passive range of motion;Vestibular;Taping;Splinting;Energy conservation;Dry needling;Visual/perceptual remediation/compensation    PT Next Visit Plan Continue with progessive balance training, Progressive LE strenthening as appropriate.    PT Home Exercise Plan Access Code: [email or web address] URL: [email or web address]  Added Bridges with BTB and encouraged more walking around the home during commerical breaks when watching TV.    Consulted and Agree with Plan of Care Patient             Patient will benefit from skilled therapeutic intervention in order to improve the following deficits and impairments:  Abnormal gait, Cardiopulmonary status limiting activity, Decreased activity tolerance, Decreased coordination, Decreased balance, Decreased endurance, Decreased mobility, Decreased strength, Decreased range of motion, Difficulty walking, Impaired flexibility, Impaired sensation, Postural dysfunction, Improper body mechanics  Visit Diagnosis: Abnormality of gait and mobility  Difficulty in walking, not elsewhere classified  Muscle weakness (generalized)  Unsteadiness on feet     Problem List Patient Active Problem List   Diagnosis Date  Noted   Persistent atrial fibrillation (HCC)    Graves disease    Abdominal bloating    Atrial fibrillation with RVR ([place]) 10/01/2019   Hyperthyroidism    Atrial fibrillation with rapid ventricular response ([place]) 09/30/2019   Leg edema    Left leg cellulitis    Special screening for malignant neoplasms, colon    Polyp of sigmoid colon    Benign neoplasm of ascending  colon    Rectal polyp    Herniated nucleus pulposus, L5-S1 11/09/2015   Low back pain 10/25/2015   Herniated nucleus pulposus, C5-6 right 10/06/2015   Dysesthesia 09/16/2015   Spasticity 09/16/2015   Unsteady gait 09/16/2015   Multiple sclerosis ([place]) 06/08/2011    [place] Moccasin, PT 08/22/2021, 5:12 PM  [place] MAIN [facility] SERVICES [address] [place], [place], [address] Phone: [phone]   Fax:  [phone]  Name: HIAWATHA MERRIOTT MRN: [id number] Date of Birth: 05-14-1956

## 2021-08-22 NOTE — Therapy (Signed)
[place] MA[facility] SERVICES [address] [place], [place], [address] Phone: [phone]   Fax:  [phone]  Occupational Therapy Treatment  Patient Details  Name: Dustin Gray MRN: [id number] Date of Birth: 07/26/1955 Referring Provider (OT): Staci Acosta, MD   Encounter Date: 08/22/2021   OT End of Session - 08/22/21 1309     Visit Number 13    Number of Visits 36    Date for OT Re-Evaluation 08/23/21    OT Start Time 0100    OT Stop Time 0202    OT Time Calculation (min) 62 min    Activity Tolerance Patient tolerated treatment well;No increased pain    Behavior During Therapy WFL for tasks assessed/performed             Past Medical History:  Diagnosis Date   Abscess    groin   Arthritis    lower spine   Erectile dysfunction    Low testosterone    Lumbar herniated disc    L5   Multiple sclerosis (HCC)    Staph aureus infection     Past Surgical History:  Procedure Laterality Date   COLONOSCOPY WITH PROPOFOL N/A 07/04/2016   Procedure: COLONOSCOPY WITH PROPOFOL;  Surgeon: [name], MD;  Location: [facility];  Service: Endoscopy;  Laterality: N/A;   [place]   POLYPECTOMY  07/04/2016   Procedure: POLYPECTOMY;  Surgeon: [name], MD;  Location: [place];  Service: Endoscopy;;   TONSILLECTOMY      There were no vitals filed for this visit.   Subjective Assessment - 08/22/21 1404     Subjective  Dustin Gray presents for OT visit 15/36 to address BLE lymphedema. Pt denies LE related leg pain. Pt has no new concerns today.    Pertinent History relevant to LE: HTN, MS, Herniated C-5-C6, L5-S1, chronic low back pain, persistent Afib,Hx LLE cellulitis, Graves disease, HYPERthyroidism, OA    Limitations difficulty walking, impaired functional mobility and transfers, impaired balance, muscle weakness, L>R,, altered sensation, chronic back pain ,  chronic leg swelling and associated pain, spasticity in legs    Repetition Increases Symptoms    Special Tests +Stemmer sign, L>R; Intake FOTO: 51/100    Patient Stated Goals Learn about lymphedema and explore treatment options    Pain Onset More than a month ago    Pain Onset More than a month ago                          OT Treatments/Exercises (OP) - 08/22/21 1405       ADLs   ADL Education Given Yes      Manual Therapy   Manual Therapy Edema management;Manual Lymphatic Drainage (MLD);Compression Bandaging    Manual Lymphatic Drainage (MLD) MLD to LLE/LLQ utilizing short neck sequence, diaphragmatic breathing, functional inguinal LN and proximal nto distal J strokes to each lower extremity segment. Fially repeat 3 retrograde sweeps to terminus and repeat short neck.    Compression Bandaging 4 layer knee length compression wrap , including foot , from base of toes to popliteal fossa using stockinett as base layer, then single layer Rosidal foam under 1 each 8,10 and 12 cm wide short stretch bandages.                    OT Education - 08/22/21 1405     Education Details Continued  Pt/ CG edu for lymphedema self care  and home program throughout session. Topics include multilayer, gradient compression wrapping, simple self-MLD, therapeutic lymphatic pumping exercises, skin/nail care, risk reduction factors and LE precautions, compression garments/recommendations and wear and care schedule and compression garment donning / doffing using assistive devices. All questions answered to the Pt's satisfaction, and Pt demonstrates understanding by report.    Person(s) Educated Patient    Methods Explanation;Demonstration;Handout    Comprehension Verbalized understanding;Returned demonstration;Need further instruction                 OT Long Term Goals - 08/10/21 1319       OT LONG TERM GOAL #1   Title Given this patients Intake score of 51/100 on the  functional outcomes FOTO tool, patient will experience an increase in function of 5 points to improve basic and instrumental ADLs performance, including lymphedema self-care.    Baseline Max A    Time 12    Period Weeks    Status Partially Met   08/08/21 ( OT visit 9) increased 2 points tfrom 51/100 initially to 53100 today.     OT LONG TERM GOAL #2   Title Pt will be able to apply knee length, multi-layer, short stretch compression wraps to one limb at a time using gradient techniques with MAX CG ASSISTANCE to decrease limb volume, to limit infection risk, and to limit lymphedema progression.    Baseline Dependent    Time 4    Period Days    Status Achieved   Pt met and exceeded this goal as he is able to wrap independently     OT LONG TERM GOAL #3   Title Pt will demonstrate understanding of lymphedema prevention strategies by identifying and discussing 5 precautions using printed reference (modified assistance) to reduce risk of progression and to limit infection risk.    Baseline Max A    Time 4    Period Days    Status Achieved    Target Date --   4th OT Rx visit     OT LONG TERM GOAL #4   Title Pt will achieve at least a 10% limb volume reduction in bilateral legs to return limb to normal size and shape,  to limit lymphedema progression and to limit infection risk.    Baseline mAx    Time 12    Period Weeks    Status Partially Met   LLE reduced by 18.8 % in 10 visits.   Target Date 08/23/21      OT LONG TERM GOAL #5   Title With MAX CG ASSISTANCE Pt will achieve and sustain a least 85% compliance with all 4 LE self-care home program components throughout Intensive Phase CDT, including modified simple self-MLD, daily skin inspection and care, lymphatic pumping the ex, 23/7 compression wraps to sustain clinical gains made in CDT and to limit lymphedema progression and further functional decline.    Baseline Dependent    Time 12    Period Weeks    Status Achieved   Met and  exceeded goal. Pt modified independent (extra time) with all home program components and consistently 85% compliant w LE self care   Target Date 08/23/21                   Plan - 08/22/21 1403     Clinical Impression Statement Pt tolerated  LLE/LLQ MLD wo increased pain. Limb volume [place] very well managed during interval. Custom compression garment is ordered  and we are awaiting delivery ASAP for fitting. Cont as per POC.    OT Occupational Profile and History Comprehensive Assessment- Review of records and extensive additional review of physical, cognitive, psychosocial history related to current functional performance    Occupational performance deficits (Please refer to evaluation for details): ADL's;IADL's;Work;Leisure;Social Participation;Other   role performance   Body Structure / Function / Physical Skills ADL;Edema;Skin integrity;Flexibility;Pain;ROM;Decreased knowledge of use of DME;Scar mobility;IADL    Rehab Potential Good    Clinical Decision Making Several treatment options, min-mod task modification necessary    Comorbidities Affecting Occupational Performance: Presence of comorbidities impacting occupational performance    Modification or Assistance to Complete Evaluation  Min-Moderate modification of tasks or assist with assess necessary to complete eval    OT Frequency 2x / week    OT Duration 12 weeks   and PRN for follow along   OT Treatment/Interventions Self-care/ADL training;Therapeutic exercise;Manual Therapy;Coping strategies training;Therapeutic activities;Manual lymph drainage;DME and/or AE instruction;Compression bandaging;Other (comment);Patient/family education   skin care to limit infection risk   Plan Complete Decongestive Therapy (CDT) One leg at a time to limit fall LLE first. Manual lymphatic drainage (MLD), skin care, ther ex, compression wraps, then    OT Home Exercise Plan Pt verbalized understanding that he requires assistance with all lymphedema home  care components, especially compression wrapping, between visits for optimal prognosis. Without assistance prognosis is poor    Recommended Other Services In insurance benefits available, garmentsConsider advanced sequential pneumatic compression device (Flexitouch) to maximize independence w lymphedema self-care at home over time. Pt unable to perform simple self-mld    Consulted and Agree with Plan of Care Patient             Patient will benefit from skilled therapeutic intervention in order to improve the following deficits and impairments:   Body Structure / Function / Physical Skills: ADL, Edema, Skin integrity, Flexibility, Pain, ROM, Decreased knowledge of use of DME, Scar mobility, IADL       Visit Diagnosis: Lymphedema, not elsewhere classified    Problem List Patient Active Problem List   Diagnosis Date Noted   Persistent atrial fibrillation (HCC)    Graves disease    Abdominal bloating    Atrial fibrillation with RVR ([place]) 10/01/2019   Hyperthyroidism    Atrial fibrillation with rapid ventricular response ([place]) 09/30/2019   Leg edema    Left leg cellulitis    Special screening for malignant neoplasms, colon    Polyp of sigmoid colon    Benign neoplasm of ascending colon    Rectal polyp    Herniated nucleus pulposus, L5-S1 11/09/2015   Low back pain 10/25/2015   Herniated nucleus pulposus, C5-6 right 10/06/2015   Dysesthesia 09/16/2015   Spasticity 09/16/2015   Unsteady gait 09/16/2015   Multiple sclerosis ([place]) 06/08/2011  Andrey Spearman, MS, OTR/L, CLT-LANA 08/22/21 2:06 PM   [place] [facility]IN [facility] SERVICES [address] [place], [place], [address] Phone: [phone]   Fax:  [phone]  Name: CARSIN RANDAZZO MRN: [id number] Date of Birth: August 15, 1955

## 2021-08-24 ENCOUNTER — Ambulatory Visit: Payer: Medicare PPO | Attending: Neurology | Admitting: Occupational Therapy

## 2021-08-24 ENCOUNTER — Telehealth: Payer: Self-pay | Admitting: Cardiology

## 2021-08-24 ENCOUNTER — Ambulatory Visit: Payer: Medicare PPO

## 2021-08-24 ENCOUNTER — Other Ambulatory Visit: Payer: Self-pay

## 2021-08-24 DIAGNOSIS — R262 Difficulty in walking, not elsewhere classified: Secondary | ICD-10-CM | POA: Diagnosis not present

## 2021-08-24 DIAGNOSIS — M6281 Muscle weakness (generalized): Secondary | ICD-10-CM | POA: Insufficient documentation

## 2021-08-24 DIAGNOSIS — R269 Unspecified abnormalities of gait and mobility: Secondary | ICD-10-CM

## 2021-08-24 DIAGNOSIS — R2681 Unsteadiness on feet: Secondary | ICD-10-CM

## 2021-08-24 DIAGNOSIS — I890 Lymphedema, not elsewhere classified: Secondary | ICD-10-CM | POA: Diagnosis not present

## 2021-08-24 NOTE — Telephone Encounter (Signed)
Patient calling to reschedule cardioversion 3/3 . He needs another day when he can arrange transportation with family.  ? ?Please call.  ? ? ?

## 2021-08-24 NOTE — Telephone Encounter (Signed)
Called cath lab, spoke with Pamala Hurry, advised pt needs to cancel DCCV schedule for tomorrow 3/3. Pt removed from schedule. ? ?This is not the first time pt has reschedule/cancel his DCCV ? ?Will r/t Dr. Quentin Ore and Otila Kluver, RN as Juluis Rainier, [place] can be determine for reschedule or if pt needs to come back for appt with Dr. Quentin Ore  ?

## 2021-08-24 NOTE — Therapy (Signed)
[place] MAIN [facility] SERVICES [address] [place], [place], [address] Phone: [phone]   Fax:  [phone]  Physical Therapy Treatment  Patient Details  Name: Dustin Gray MRN: [id number] Date of Birth: February 11, 1956 No data recorded  Encounter Date: 08/24/2021   PT End of Session - 08/24/21 1400     Visit Number 41    Number of Visits 43    Date for PT Re-Evaluation 10/11/21    Authorization Type [employer]    Authorization Time Period 01/31/21-04/25/21    PT Start Time 1359    PT Stop Time 1443    PT Time Calculation (min) 44 min    Equipment Utilized During Treatment Gait belt    Activity Tolerance Patient tolerated treatment well;No increased pain;Patient limited by fatigue    Behavior During Therapy [facility] for tasks assessed/performed             Past Medical History:  Diagnosis Date   Abscess    groin   Arthritis    lower spine   Erectile dysfunction    Low testosterone    Lumbar herniated disc    L5   Multiple sclerosis (HCC)    Staph aureus infection     Past Surgical History:  Procedure Laterality Date   COLONOSCOPY WITH PROPOFOL N/A 07/04/2016   Procedure: COLONOSCOPY WITH PROPOFOL;  Surgeon: [name], MD;  Location: [facility] [place];  Service: Endoscopy;  Laterality: N/A;   [place]   POLYPECTOMY  07/04/2016   Procedure: POLYPECTOMY;  Surgeon: [name], MD;  Location: [place];  Service: Endoscopy;;   TONSILLECTOMY      There were no vitals filed for this visit.   Subjective Assessment - 08/24/21 1358     Subjective Patient reports doing okay today. States his cardioversion was going to have to be rescheduled.    Pertinent History Patient presents to physical therapy for unsteadiness, walking instability, fatigue. His PMH includes paroxysmal a fib, MS, lymphedema, hypothyroidism, Graves Disease, arthritis, lumbar herniated disc (L5), depression and  neuropathy. Patient first developed symptoms of MS in 2005 and was diagnosed in 2006. Patient has done PT in the past, but hasn't been seen since 2020 at [facility]. Has not been doing his exercises in the past year. Walk outside to garage to ride lawnmower, walks in house. 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 a couple hours    How long can you stand comfortably? no more than 10 minutes before pain, have to steady self immediately    How long can you walk comfortably? has to use rollator. can walk in a store with a cart    Patient Stated Goals want to get stronger and feel mor comfortable walking around. Patient would like to continue strengthening hips and improve endurance and ability to pace self for safety while walking.    Currently in Pain? No/denies            INTERVENTIONS:  *fist 10 min of visit - patient had to take a phone call  sit to stand (staggered feet) x 5 then switch feet for 5 more without UE support  Step ups x 15 reps min BUE support at steps  Step over 1/2 foam Min BUE support x 15 reps alt LE  Mini lunge  squat Alt LE x 15 reps  Eccentric stand to sit (eccentric mini squat) with chair for reference. Patient reports as medium - performing 12 reps each.    Education provided throughout session via VC/TC and demonstration to facilitate movement at target joints and correct muscle activation for all testing and exercises performed.                         PT Education - 08/24/21 1359     Education Details Exercise techniques    Person(s) Educated Patient    Methods Explanation;Demonstration;Tactile cues;Verbal cues    Comprehension Verbalized understanding;Returned demonstration;Verbal cues required;Tactile cues required;Need further instruction              PT Short Term Goals - 08/22/21 1358        PT SHORT TERM GOAL #1   Title Patient will be independent in home exercise program to improve strength/mobility for better functional independence with ADLs.    Baseline 8/9: HEP to be given next session, 10/31: patient reports compliance with HEP and would like progressions added next session. 08/22/2021=Patient verbalized that he is walking and doing his LE exercises with no questions at this time.    Time 4    Period Weeks    Status Achieved    Target Date 08/16/21               PT Long Term Goals - 08/22/21 1355       PT LONG TERM GOAL #1   Title Patient will increase FOTO score to equal to or greater than 70%    to demonstrate statistically significant improvement in mobility and quality of life.    Baseline 8/9: 64%; risk adjusted 34%; 03/21/21 FOTO: 45; 04/24/21 FOTO: 49% ; 08/22/2021= 56%    Time 12    Period Weeks    Status Partially Met    Target Date 10/11/21      PT LONG TERM GOAL #2   Title Patient will increase Berg Balance score by > 6 points to demonstrate decreased fall risk during functional activities.    Baseline 8/9: BBT 29/56; 03/21/21: 42/56    Time 12    Period Weeks    Status Achieved      PT LONG TERM GOAL #3   Title Patient will increase 10 meter walk test to >1.62ms as to improve gait speed for better community ambulation and to reduce fall risk.    Baseline 8/9: 0.65 m/s with rollator; 10/31: 0.68 m/s with rollator. 07/06/2021= 0.71 m/s using rollator; 08/22/2021= 0.94 m/s using rollator.    Time 12    Period Weeks    Status On-going    Target Date 10/11/21      PT LONG TERM GOAL #4   Title Patient will increase six minute walk test distance to >1000 for progression to community ambulator and improve gait ability    Baseline 8/9: 505 ft with rollator; 03/21/21: [id number] rollator, 04/24/21: [id number] rollator; 07/06/2021=900 feet; 08/22/2021= 935 feet with use of Rollator    Time 12    Period Weeks    Status On-going    Target Date 10/11/21       PT LONG TERM GOAL #5   Title Patient will increase glute medius strength on Left LE from 3-/5 to 4/5 to improve stability, gait mechanics, and functional strength.    Baseline 10/31: 3-/5; 07/06/2021= 3-/5; 08/22/2021=3-/5 unable to achieve full ROM yet able to withstand  some resistance with testing    Time 12    Period Weeks    Status On-going    Target Date 10/11/21      PT LONG TERM GOAL #6   Title Patient will increase Left single leg stance time to 15 seconds or greater to increase safety in shower and independence with ADLs.    Baseline 10/31; 1.83 sec without UE support; 08/22/2021=2-3  sec  on left; 6 sec on right    Time 12    Period Weeks    Status On-going    Target Date 10/11/21                   Plan - 08/24/21 1400     Clinical Impression Statement Patient presents with improved ability to side step with Left LE and good overall response to progressive LE strengthening today. He was able to perform eccentric stand to sit without significant difficulty other than fatigue. Patient will continue to benefit from skilled physical therapy intervention in order to improve his strength, ambulatory capacity, balance, and increase his overall function and safety with everyday ambulation    Personal Factors and Comorbidities Age;Comorbidity 3+;Fitness;Past/Current Experience;Time since onset of injury/illness/exacerbation    Comorbidities aroxysmal a fib, MS, lymphedema, hypothyroidism, Graves Disease, arthritis, lumbar herniated disc (L5), depression and neuropathy    Examination-Activity Limitations Bed Mobility;Bend;Caring for Others;Carry;Reach Overhead;Locomotion Level;Lift;Hygiene/Grooming;Dressing;Squat;Stairs;Stand;Toileting;Transfers    Examination-Participation Restrictions Cleaning;Community Activity;Laundry;Occupation;Shop;Volunteer;Yard Work    [employer] officer Evolving/Moderate complexity    Rehab Potential Fair    PT Frequency 2x / week    PT  Duration 12 weeks    PT Treatment/Interventions ADLs/Self Care Home Management;Aquatic Therapy;Canalith Repostioning;Biofeedback;Cryotherapy;Ultrasound;Traction;Moist Heat;Electrical Stimulation;DME Instruction;Gait training;Therapeutic exercise;Therapeutic activities;Functional mobility training;Stair training;Balance training;Neuromuscular re-education;Patient/family education;Manual techniques;Orthotic Fit/Training;Compression bandaging;Passive range of motion;Vestibular;Taping;Splinting;Energy conservation;Dry needling;Visual/perceptual remediation/compensation    PT Next Visit Plan Continue with progessive balance training, Progressive LE strenthening as appropriate.    PT Home Exercise Plan Access Code: [email or web address] URL: [email or web address]  Added [place] with BTB and encouraged more walking around the home during commerical breaks when watching TV.    Consulted and Agree with Plan of Care Patient             Patient will benefit from skilled therapeutic intervention in order to improve the following deficits and impairments:  Abnormal gait, Cardiopulmonary status limiting activity, Decreased activity tolerance, Decreased coordination, Decreased balance, Decreased endurance, Decreased mobility, Decreased strength, Decreased range of motion, Difficulty walking, Impaired flexibility, Impaired sensation, Postural dysfunction, Improper body mechanics  Visit Diagnosis: Abnormality of gait and mobility  Difficulty in walking, not elsewhere classified  Muscle weakness (generalized)  Unsteadiness on feet     Problem List Patient Active Problem List   Diagnosis Date Noted   Persistent atrial fibrillation (HCC)    Graves disease    Abdominal bloating    Atrial fibrillation with RVR ([place]) 10/01/2019   Hyperthyroidism    Atrial fibrillation with rapid ventricular response ([place]) 09/30/2019   Leg edema    Left leg cellulitis    Special screening for malignant neoplasms, colon     Polyp of sigmoid colon    Benign neoplasm of ascending colon    Rectal polyp    Herniated nucleus pulposus, L5-S1 11/09/2015   Low back pain 10/25/2015   Herniated nucleus pulposus, C5-6 right 10/06/2015   Dysesthesia 09/16/2015   Spasticity 09/16/2015   Unsteady gait 09/16/2015   Multiple sclerosis ([place]) 06/08/2011    Lewis Moccasin, PT 08/24/2021, 8:09 PM  [place]  [place] [facility] SERVICES [address] [place], [place], [address] Phone: [phone]   Fax:  [phone]  Name: Dustin Gray MRN: [id number] Date of Birth: 12/22/1955

## 2021-08-25 ENCOUNTER — Encounter: Admission: RE | Payer: Self-pay | Source: Home / Self Care

## 2021-08-25 ENCOUNTER — Ambulatory Visit: Admission: RE | Admit: 2021-08-25 | Payer: Medicare PPO | Source: Home / Self Care | Admitting: Cardiovascular Disease

## 2021-08-25 DIAGNOSIS — I4819 Other persistent atrial fibrillation: Secondary | ICD-10-CM

## 2021-08-25 SURGERY — CARDIOVERSION
Anesthesia: General

## 2021-08-25 NOTE — Therapy (Signed)
[place] MAIN [facility] SERVICES [address] [place], [place], [address] Phone: [phone]   Fax:  [phone]  Occupational Therapy Treatment  Patient Details  Name: Dustin Gray MRN: [id number] Date of Birth: 1955/09/24 Referring Provider (OT): Staci Acosta, MD   Encounter Date: 08/24/2021   OT End of Session - 08/24/21 1311     Visit Number 14    Number of Visits 36    Date for OT Re-Evaluation 08/23/21    OT Start Time 0105    OT Stop Time 0210    OT Time Calculation (min) 65 min    Activity Tolerance Patient tolerated treatment well;No increased pain    Behavior During Therapy WFL for tasks assessed/performed             Past Medical History:  Diagnosis Date   Abscess    groin   Arthritis    lower spine   Erectile dysfunction    Low testosterone    Lumbar herniated disc    L5   Multiple sclerosis (HCC)    Staph aureus infection     Past Surgical History:  Procedure Laterality Date   COLONOSCOPY WITH PROPOFOL N/A 07/04/2016   Procedure: COLONOSCOPY WITH PROPOFOL;  Surgeon: [name], MD;  Location: [place];  Service: Endoscopy;  Laterality: N/A;   [place]   POLYPECTOMY  07/04/2016   Procedure: POLYPECTOMY;  Surgeon: [name], MD;  Location: [place];  Service: Endoscopy;;   TONSILLECTOMY      There were no vitals filed for this visit.   Subjective Assessment - 08/24/21 1616     Subjective  Dustin Gray presents for OT visit 16/36 to address BLE lymphedema. Pt denies LE related leg pain. Pt has no new concerns today.    Pertinent History relevant to LE: HTN, MS, Herniated C-5-C6, L5-S1, chronic low back pain, persistent Afib,Hx LLE cellulitis, Graves disease, HYPERthyroidism, OA    Limitations difficulty walking, impaired functional mobility and transfers, impaired balance, muscle weakness, L>R,, altered sensation, chronic back pain , chronic  leg swelling and associated pain, spasticity in legs    Repetition Increases Symptoms    Special Tests +Stemmer sign, L>R; Intake FOTO: 51/100    Patient Stated Goals Learn about lymphedema and explore treatment options    Pain Onset More than a month ago    Pain Onset More than a month ago                          OT Treatments/Exercises (OP) - 08/24/21 1616       Manual Therapy   Manual Therapy Edema management;Manual Lymphatic Drainage (MLD);Compression Bandaging    Manual Lymphatic Drainage (MLD) MLD to LLE/LLQ utilizing short neck sequence, diaphragmatic breathing, functional inguinal LN and proximal nto distal J strokes to each lower extremity segment. Fially repeat 3 retrograde sweeps to terminus and repeat short neck.    Compression Bandaging 4 layer knee length compression wrap , including foot , from base of toes to popliteal fossa using stockinett as base layer, then single layer Rosidal foam under 1 each 8,10 and 12 cm wide short stretch bandages.                         OT Long Term Goals - 08/10/21 1319       OT LONG TERM GOAL #1  Title Given this patients Intake score of 51/100 on the functional outcomes FOTO tool, patient will experience an increase in function of 5 points to improve basic and instrumental ADLs performance, including lymphedema self-care.    Baseline Max A    Time 12    Period Weeks    Status Partially Met   08/08/21 ( OT visit 9) increased 2 points tfrom 51/100 initially to 53100 today.     OT LONG TERM GOAL #2   Title Pt will be able to apply knee length, multi-layer, short stretch compression wraps to one limb at a time using gradient techniques with MAX CG ASSISTANCE to decrease limb volume, to limit infection risk, and to limit lymphedema progression.    Baseline Dependent    Time 4    Period Days    Status Achieved   Pt met and exceeded this goal as he is able to wrap independently     OT LONG TERM GOAL #3    Title Pt will demonstrate understanding of lymphedema prevention strategies by identifying and discussing 5 precautions using printed reference (modified assistance) to reduce risk of progression and to limit infection risk.    Baseline Max A    Time 4    Period Days    Status Achieved    Target Date --   4th OT Rx visit     OT LONG TERM GOAL #4   Title Pt will achieve at least a 10% limb volume reduction in bilateral legs to return limb to normal size and shape,  to limit lymphedema progression and to limit infection risk.    Baseline mAx    Time 12    Period Weeks    Status Partially Met   LLE reduced by 18.8 % in 10 visits.   Target Date 08/23/21      OT LONG TERM GOAL #5   Title With MAX CG ASSISTANCE Pt will achieve and sustain a least 85% compliance with all 4 LE self-care home program components throughout Intensive Phase CDT, including modified simple self-MLD, daily skin inspection and care, lymphatic pumping the ex, 23/7 compression wraps to sustain clinical gains made in CDT and to limit lymphedema progression and further functional decline.    Baseline Dependent    Time 12    Period Weeks    Status Achieved   Met and exceeded goal. Pt modified independent (extra time) with all home program components and consistently 85% compliant w LE self care   Target Date 08/23/21                   Plan - 08/24/21 1312     Clinical Impression Statement pT TOLERATED LLE/LLQ MLD wo increased pain. Limb volume [place] very well managed during interval. Custom compression garment is ordered and we are awaiting delivery ASAP for fitting. Cont as per POC.    OT Occupational Profile and History Comprehensive Assessment- Review of records and extensive additional review of physical, cognitive, psychosocial history related to current functional performance    Occupational performance deficits (Please refer to evaluation for details): ADL's;IADL's;Work;Leisure;Social Participation;Other   role  performance   Body Structure / Function / Physical Skills ADL;Edema;Skin integrity;Flexibility;Pain;ROM;Decreased knowledge of use of DME;Scar mobility;IADL    Rehab Potential Good    Clinical Decision Making Several treatment options, min-mod task modification necessary    Comorbidities Affecting Occupational Performance: Presence of comorbidities impacting occupational performance    Modification or Assistance to Complete Evaluation  Min-Moderate modification of  tasks or assist with assess necessary to complete eval    OT Frequency 2x / week    OT Duration 12 weeks   and PRN for follow along   OT Treatment/Interventions Self-care/ADL training;Therapeutic exercise;Manual Therapy;Coping strategies training;Therapeutic activities;Manual lymph drainage;DME and/or AE instruction;Compression bandaging;Other (comment);Patient/family education   skin care to limit infection risk   Plan Complete Decongestive Therapy (CDT) One leg at a time to limit fall LLE first. Manual lymphatic drainage (MLD), skin care, ther ex, compression wraps, then    OT Home Exercise Plan Pt verbalized understanding that he requires assistance with all lymphedema home care components, especially compression wrapping, between visits for optimal prognosis. Without assistance prognosis is poor    Recommended Other Services In insurance benefits available, garmentsConsider advanced sequential pneumatic compression device (Flexitouch) to maximize independence w lymphedema self-care at home over time. Pt unable to perform simple self-mld    Consulted and Agree with Plan of Care Patient             Patient will benefit from skilled therapeutic intervention in order to improve the following deficits and impairments:   Body Structure / Function / Physical Skills: ADL, Edema, Skin integrity, Flexibility, Pain, ROM, Decreased knowledge of use of DME, Scar mobility, IADL       Visit Diagnosis: Lymphedema, not elsewhere  classified    Problem List Patient Active Problem List   Diagnosis Date Noted   Persistent atrial fibrillation (HCC)    Graves disease    Abdominal bloating    Atrial fibrillation with RVR ([place]) 10/01/2019   Hyperthyroidism    Atrial fibrillation with rapid ventricular response ([place]) 09/30/2019   Leg edema    Left leg cellulitis    Special screening for malignant neoplasms, colon    Polyp of sigmoid colon    Benign neoplasm of ascending colon    Rectal polyp    Herniated nucleus pulposus, L5-S1 11/09/2015   Low back pain 10/25/2015   Herniated nucleus pulposus, C5-6 right 10/06/2015   Dysesthesia 09/16/2015   Spasticity 09/16/2015   Unsteady gait 09/16/2015   Multiple sclerosis ([place]) 06/08/2011   Andrey Spearman, MS, OTR/L, [facility] 08/25/21 10:33 AM   [place], [place], [address] Phone: [phone]   Fax:  [phone]  Name: Dustin Gray MRN: [id number] Date of Birth: 1956/03/29

## 2021-08-29 ENCOUNTER — Ambulatory Visit: Payer: Medicare PPO | Admitting: Occupational Therapy

## 2021-08-29 ENCOUNTER — Other Ambulatory Visit: Payer: Self-pay

## 2021-08-29 DIAGNOSIS — R2681 Unsteadiness on feet: Secondary | ICD-10-CM | POA: Diagnosis not present

## 2021-08-29 DIAGNOSIS — R269 Unspecified abnormalities of gait and mobility: Secondary | ICD-10-CM | POA: Diagnosis not present

## 2021-08-29 DIAGNOSIS — R262 Difficulty in walking, not elsewhere classified: Secondary | ICD-10-CM | POA: Diagnosis not present

## 2021-08-29 DIAGNOSIS — I890 Lymphedema, not elsewhere classified: Secondary | ICD-10-CM

## 2021-08-29 DIAGNOSIS — M6281 Muscle weakness (generalized): Secondary | ICD-10-CM | POA: Diagnosis not present

## 2021-08-29 NOTE — Patient Instructions (Signed)

## 2021-08-30 ENCOUNTER — Encounter: Payer: Self-pay | Admitting: Physical Therapy

## 2021-08-30 ENCOUNTER — Ambulatory Visit: Payer: Medicare PPO | Admitting: Physical Therapy

## 2021-08-30 DIAGNOSIS — I890 Lymphedema, not elsewhere classified: Secondary | ICD-10-CM | POA: Diagnosis not present

## 2021-08-30 DIAGNOSIS — R269 Unspecified abnormalities of gait and mobility: Secondary | ICD-10-CM | POA: Diagnosis not present

## 2021-08-30 DIAGNOSIS — M6281 Muscle weakness (generalized): Secondary | ICD-10-CM | POA: Diagnosis not present

## 2021-08-30 DIAGNOSIS — R2681 Unsteadiness on feet: Secondary | ICD-10-CM

## 2021-08-30 DIAGNOSIS — R262 Difficulty in walking, not elsewhere classified: Secondary | ICD-10-CM

## 2021-08-30 NOTE — Therapy (Signed)
[place] MAIN [facility] SERVICES [address] [place], [place], [address] Phone: [phone]   Fax:  [phone]  Physical Therapy Treatment  Patient Details  Name: Dustin Gray MRN: [id number] Date of Birth: 16-Sep-1955 No data recorded  Encounter Date: 08/30/2021   PT End of Session - 08/30/21 1352     Visit Number 42    Number of Visits 43    Date for PT Re-Evaluation 10/11/21    Authorization Type [employer]    Authorization Time Period 01/31/21-04/25/21    Equipment Utilized During Treatment Gait belt    Activity Tolerance Patient tolerated treatment well;No increased pain;Patient limited by fatigue    Behavior During Therapy [facility] for tasks assessed/performed             Past Medical History:  Diagnosis Date   Abscess    groin   Arthritis    lower spine   Erectile dysfunction    Low testosterone    Lumbar herniated disc    L5   Multiple sclerosis (HCC)    Staph aureus infection     Past Surgical History:  Procedure Laterality Date   COLONOSCOPY WITH PROPOFOL N/A 07/04/2016   Procedure: COLONOSCOPY WITH PROPOFOL;  Surgeon: [name], MD;  Location: [place];  Service: Endoscopy;  Laterality: N/A;   [place]   POLYPECTOMY  07/04/2016   Procedure: POLYPECTOMY;  Surgeon: [name], MD;  Location: [place];  Service: Endoscopy;;   TONSILLECTOMY      There were no vitals filed for this visit.   Subjective Assessment - 08/30/21 1345     Subjective Patient reports doing okay today. No falls but has been having trouble with balance when walking his DTRs dog (small dog- bandit)    Pertinent History Patient presents to physical therapy for unsteadiness, walking instability, fatigue. His PMH includes paroxysmal a fib, MS, lymphedema, hypothyroidism, Graves Disease, arthritis, lumbar herniated disc (L5), depression and neuropathy. Patient first developed symptoms of MS  in 2005 and was diagnosed in 2006. Patient has done PT in the past, but hasn't been seen since 2020 at [facility]. Has not been doing his exercises in the past year. Walk outside to garage to ride lawnmower, walks in house. 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 a couple hours    How long can you stand comfortably? no more than 10 minutes before pain, have to steady self immediately    How long can you walk comfortably? has to use rollator. can walk in a store with a cart    Patient Stated Goals want to get stronger and feel mor comfortable walking around. Patient would like to continue strengthening hips and improve endurance and ability to pace self for safety while walking.                    Exercise/Activity Sets/Reps/Time/ Resistance Assistance Charge type Comments  STS with staggered stance ( L LE post)  3 x 5  CGA  There ACT To improve L LE strength and weightbearing  Occasional minor LOB posterior, cues for ant lean    1/2 Foam Roller rocks  2*10 CGA, UE support  Therex  To improve ability to shift weight   Semitandem stance on airex pad X 2 rounds x 1  min ( with head turns)  CGA  NMR  Some sway and self correction noted throughout.   Step taps (2x airex pads plus )  X 10 ea LE CGA, B UE  NMR  To improve balance with stepping up and to improve L LE neuromuscular control.  External cue of theraband pad for target with L LE stepping up.   Squats  X 10  B UE  Therex Cues for equal weightbearing bilaterally   Step over 1/2 foam  *10 ea LE  U UE  Therex  Increased difficulty with L LE step overs compared to RLE   Tandem stance L LE post  2 x 30 sec  CGA  NMR  Good use of ankle strategies, min LOB                           Treatment Provided this session   Pt educated throughout session about proper posture and technique with exercises.  Improved exercise technique, movement at target joints, use of target muscles after min to mod verbal, visual, tactile cues.  Note: Portions of this document were prepared using Dragon voice recognition software and although reviewed may contain unintentional dictation errors in syntax, grammar, or spelling.  Pt required occasional rest breaks due fatigue, PT was quick to ask when pt appeared to be fatiguing in order to prevent excessive fatigue.                            PT Education - 08/30/21 1352     Education Details exercise technique, body mechanics    Person(s) Educated Patient    Methods Explanation;Demonstration;Tactile cues;Verbal cues    Comprehension Verbalized understanding;Returned demonstration              PT Short Term Goals - 08/22/21 1358       PT SHORT TERM GOAL #1   Title Patient will be independent in home exercise program to improve strength/mobility for better functional independence with ADLs.    Baseline 8/9: HEP to be given next session, 10/31: patient reports compliance with HEP and would like progressions added next session. 08/22/2021=Patient verbalized that he is walking and doing his LE exercises with no questions at this time.    Time 4    Period Weeks    Status Achieved    Target Date 08/16/21               PT Long Term Goals - 08/22/21 1355       PT LONG TERM GOAL #1   Title Patient will increase FOTO score to equal to or greater than 70%    to demonstrate statistically significant improvement in mobility and quality of life.    Baseline 8/9: 64%; risk adjusted 34%; 03/21/21 FOTO: 45; 04/24/21 FOTO: 49% ; 08/22/2021= 56%    Time 12    Period Weeks    Status Partially Met    Target Date 10/11/21      PT LONG TERM GOAL #2   Title Patient will increase Berg Balance score by > 6 points to demonstrate decreased fall risk during functional activities.    Baseline 8/9: BBT 29/56; 03/21/21: 42/56    Time 12     Period Weeks    Status Achieved      PT LONG TERM GOAL #3   Title Patient will increase 10 meter walk test to >1.58ms as to improve  gait speed for better community ambulation and to reduce fall risk.    Baseline 8/9: 0.65 m/s with rollator; 10/31: 0.68 m/s with rollator. 07/06/2021= 0.71 m/s using rollator; 08/22/2021= 0.94 m/s using rollator.    Time 12    Period Weeks    Status On-going    Target Date 10/11/21      PT LONG TERM GOAL #4   Title Patient will increase six minute walk test distance to >1000 for progression to community ambulator and improve gait ability    Baseline 8/9: 505 ft with rollator; 03/21/21: 724f c rollator, 04/24/21: [id number] rollator; 07/06/2021=900 feet; 08/22/2021= 935 feet with use of Rollator    Time 12    Period Weeks    Status On-going    Target Date 10/11/21      PT LONG TERM GOAL #5   Title Patient will increase glute medius strength on Left LE from 3-/5 to 4/5 to improve stability, gait mechanics, and functional strength.    Baseline 10/31: 3-/5; 07/06/2021= 3-/5; 08/22/2021=3-/5 unable to achieve full ROM yet able to withstand some resistance with testing    Time 12    Period Weeks    Status On-going    Target Date 10/11/21      PT LONG TERM GOAL #6   Title Patient will increase Left single leg stance time to 15 seconds or greater to increase safety in shower and independence with ADLs.    Baseline 10/31; 1.83 sec without UE support; 08/22/2021=2-3  sec  on left; 6 sec on right    Time 12    Period Weeks    Status On-going    Target Date 10/11/21                   Plan - 08/30/21 1617     Clinical Impression Statement Patient presents with good motivation for completion of therapy program.  Patient continues to demonstrate impaired lower extremity strength and balance with stepping this session.  Patient continued with dynamic lower extremity strengthening, balance, mobility related exercises and overall tolerated well but did have some  quickness to fatigue and required several rest breaks throughout.  Patient will continue to benefit from skilled physical therapy intervention in order to improve his strength, balance, ambulatory capacity, improve his overall function and quality of life.    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    Rehab Potential Fair    PT Frequency 2x / week    PT Duration 12 weeks    PT Treatment/Interventions ADLs/Self Care Home Management;Aquatic Therapy;Canalith Repostioning;Biofeedback;Cryotherapy;Ultrasound;Traction;Moist Heat;Electrical Stimulation;DME Instruction;Gait training;Therapeutic exercise;Therapeutic activities;Functional mobility training;Stair training;Balance training;Neuromuscular re-education;Patient/family education;Manual techniques;Orthotic Fit/Training;Compression bandaging;Passive range of motion;Vestibular;Taping;Splinting;Energy conservation;Dry needling;Visual/perceptual remediation/compensation    PT Next Visit Plan Continue with progessive balance training, Progressive LE strenthening as appropriate.    PT Home Exercise Plan Access Code: [email or web address]: [email or web address]  Added [place] with BTB and encouraged more walking around the home during commerical breaks when watching TV.    Consulted and Agree with Plan of Care Patient             Patient will benefit from  skilled therapeutic intervention in order to improve the following deficits and  impairments:  Abnormal gait, Cardiopulmonary status limiting activity, Decreased activity tolerance, Decreased coordination, Decreased balance, Decreased endurance, Decreased mobility, Decreased strength, Decreased range of motion, Difficulty walking, Impaired flexibility, Impaired sensation, Postural dysfunction, Improper body mechanics  Visit Diagnosis: Abnormality of gait and mobility  Difficulty in walking, not elsewhere classified  Unsteadiness on feet     Problem List Patient Active Problem List   Diagnosis Date Noted   Persistent atrial fibrillation (HCC)    Graves disease    Abdominal bloating    Atrial fibrillation with RVR ([place]) 10/01/2019   Hyperthyroidism    Atrial fibrillation with rapid ventricular response ([place]) 09/30/2019   Leg edema    Left leg cellulitis    Special screening for malignant neoplasms, colon    Polyp of sigmoid colon    Benign neoplasm of ascending colon    Rectal polyp    Herniated nucleus pulposus, L5-S1 11/09/2015   Low back pain 10/25/2015   Herniated nucleus pulposus, C5-6 right 10/06/2015   Dysesthesia 09/16/2015   Spasticity 09/16/2015   Unsteady gait 09/16/2015   Multiple sclerosis ([place]) 06/08/2011    Particia Lather, PT 08/30/2021, 4:19 PM  [place] MAIN [facility] SERVICES [address] [place], [place], [address] Phone: [phone]   Fax:  [phone]  Name: GARTH DIFFLEY MRN: [id number] Date of Birth: May 28, 1956

## 2021-08-30 NOTE — Therapy (Signed)
[place] MAIN [facility] SERVICES [address] [place], [place], [address] Phone: [phone]   Fax:  [phone]  Occupational Therapy Treatment  Patient Details  Name: Dustin Gray MRN: [id number] Date of Birth: May 31, 1956 Referring Provider (OT): Staci Acosta, MD   Encounter Date: 08/29/2021   OT End of Session - 08/29/21 1406     Visit Number 17   Number of Visits 36    Date for OT Re-Evaluation 08/23/21    OT Start Time 0206    OT Stop Time 0306    OT Time Calculation (min) 60 min    Activity Tolerance Patient tolerated treatment well;No increased pain    Behavior During Therapy WFL for tasks assessed/performed             Past Medical History:  Diagnosis Date   Abscess    groin   Arthritis    lower spine   Erectile dysfunction    Low testosterone    Lumbar herniated disc    L5   Multiple sclerosis (HCC)    Staph aureus infection     Past Surgical History:  Procedure Laterality Date   COLONOSCOPY WITH PROPOFOL N/A 07/04/2016   Procedure: COLONOSCOPY WITH PROPOFOL;  Surgeon: [name], MD;  Location: [facility];  Service: Endoscopy;  Laterality: N/A;   [place]   POLYPECTOMY  07/04/2016   Procedure: POLYPECTOMY;  Surgeon: [name], MD;  Location: [place];  Service: Endoscopy;;   TONSILLECTOMY      There were no vitals filed for this visit.   Subjective Assessment - 08/29/21 1415     Subjective  Dustin Gray presents for OT visit 17/36 to address BLE lymphedema. Dustin Gray denies LE related leg pain. Dustin Gray has no new concerns today.    Pertinent History relevant to LE: HTN, MS, Herniated C-5-C6, L5-S1, chronic low back pain, persistent Afib,Hx LLE cellulitis, Graves disease, HYPERthyroidism, OA    Limitations difficulty walking, impaired functional mobility and transfers, impaired balance, muscle weakness, L>R,, altered sensation, chronic back pain , chronic  leg swelling and associated pain, spasticity in legs    Repetition Increases Symptoms    Special Tests +Stemmer sign, L>R; Intake FOTO: 51/100    Patient Stated Goals Learn about lymphedema and explore treatment options    Pain Onset More than a month ago    Pain Onset More than a month ago                          OT Treatments/Exercises (OP) - 08/30/21 1412       ADLs   ADL Education Given Yes      Manual Therapy   Manual Therapy Edema management;Manual Lymphatic Drainage (MLD);Compression Bandaging    Manual Lymphatic Drainage (MLD) MLD to LLE/LLQ utilizing short neck sequence, diaphragmatic breathing, functional inguinal LN and proximal nto distal J strokes to each lower extremity segment. Fially repeat 3 retrograde sweeps to terminus and repeat short neck.    Compression Bandaging 4 layer knee length compression wrap , including foot , from base of toes to popliteal fossa using stockinett as base layer, then single layer Rosidal foam under 1 each 8,10 and 12 cm wide short stretch bandages.                    OT Education - 08/29/21 1417     Education Details Continued Dustin Gray/  CG edu for lymphedema self care  and home program throughout session. Topics include multilayer, gradient compression wrapping, simple self-MLD, therapeutic lymphatic pumping exercises, skin/nail care, risk reduction factors and LE precautions, compression garments/recommendations and wear and care schedule and compression garment donning / doffing using assistive devices. All questions answered to the Dustin Gray's satisfaction, and Dustin Gray demonstrates understanding by report.    Person(s) Educated Patient    Methods Explanation;Demonstration;Handout    Comprehension Verbalized understanding;Returned demonstration;Need further instruction                 OT Long Term Goals - 08/10/21 1319       OT LONG TERM GOAL #1   Title Given this patients Intake score of 51/100 on the functional  outcomes FOTO tool, patient will experience an increase in function of 5 points to improve basic and instrumental ADLs performance, including lymphedema self-care.    Baseline Max A    Time 12    Period Weeks    Status Partially Met   08/08/21 ( OT visit 9) increased 2 points tfrom 51/100 initially to 53100 today.     OT LONG TERM GOAL #2   Title Dustin Gray will be able to apply knee length, multi-layer, short stretch compression wraps to one limb at a time using gradient techniques with MAX CG ASSISTANCE to decrease limb volume, to limit infection risk, and to limit lymphedema progression.    Baseline Dependent    Time 4    Period Days    Status Achieved   Dustin Gray met and exceeded this goal as he is able to wrap independently     OT LONG TERM GOAL #3   Title Dustin Gray will demonstrate understanding of lymphedema prevention strategies by identifying and discussing 5 precautions using printed reference (modified assistance) to reduce risk of progression and to limit infection risk.    Baseline Max A    Time 4    Period Days    Status Achieved    Target Date --   4th OT Rx visit     OT LONG TERM GOAL #4   Title Dustin Gray will achieve at least a 10% limb volume reduction in bilateral legs to return limb to normal size and shape,  to limit lymphedema progression and to limit infection risk.    Baseline mAx    Time 12    Period Weeks    Status Partially Met   LLE reduced by 18.8 % in 10 visits.   Target Date 08/23/21      OT LONG TERM GOAL #5   Title With MAX CG ASSISTANCE Dustin Gray will achieve and sustain a least 85% compliance with all 4 LE self-care home program components throughout Intensive Phase CDT, including modified simple self-MLD, daily skin inspection and care, lymphatic pumping the ex, 23/7 compression wraps to sustain clinical gains made in CDT and to limit lymphedema progression and further functional decline.    Baseline Dependent    Time 12    Period Weeks    Status Achieved   Met and exceeded goal. Dustin Gray  modified independent (extra time) with all home program components and consistently 85% compliant w LE self care   Target Date 08/23/21                   Plan - 08/30/21 1410     Clinical Impression Statement LLE lymphedema very well controlled at present with multilayer wraps. Skin condition contibnues to present with improved flexibility and hydration. Provided LLE/LLQ  MLD as established  with good tolerance. Reapplied multilayer wraps as established. Will fit custom LLE compression wrap as soon as delivered. CONT as per POC.    OT Occupational Profile and History Comprehensive Assessment- Review of records and extensive additional review of physical, cognitive, psychosocial history related to current functional performance    Occupational performance deficits (Please refer to evaluation for details): ADL's;IADL's;Work;Leisure;Social Participation;Other   role performance   Body Structure / Function / Physical Skills ADL;Edema;Skin integrity;Flexibility;Pain;ROM;Decreased knowledge of use of DME;Scar mobility;IADL    Rehab Potential Good    Clinical Decision Making Several treatment options, min-mod task modification necessary    Comorbidities Affecting Occupational Performance: Presence of comorbidities impacting occupational performance    Modification or Assistance to Complete Evaluation  Min-Moderate modification of tasks or assist with assess necessary to complete eval    OT Frequency 2x / week    OT Duration 12 weeks   and PRN for follow along   OT Treatment/Interventions Self-care/ADL training;Therapeutic exercise;Manual Therapy;Coping strategies training;Therapeutic activities;Manual lymph drainage;DME and/or AE instruction;Compression bandaging;Other (comment);Patient/family education   skin care to limit infection risk   Plan Complete Decongestive Therapy (CDT) One leg at a time to limit fall LLE first. Manual lymphatic drainage (MLD), skin care, ther ex, compression wraps,  then    OT Home Exercise Plan Dustin Gray verbalized understanding that he requires assistance with all lymphedema home care components, especially compression wrapping, between visits for optimal prognosis. Without assistance prognosis is poor    Recommended Other Services In insurance benefits available, garmentsConsider advanced sequential pneumatic compression device (Flexitouch) to maximize independence w lymphedema self-care at home over time. Dustin Gray unable to perform simple self-mld    Consulted and Agree with Plan of Care Patient             Patient will benefit from skilled therapeutic intervention in order to improve the following deficits and impairments:   Body Structure / Function / Physical Skills: ADL, Edema, Skin integrity, Flexibility, Pain, ROM, Decreased knowledge of use of DME, Scar mobility, IADL       Visit Diagnosis: Lymphedema, not elsewhere classified    Problem List Patient Active Problem List   Diagnosis Date Noted   Persistent atrial fibrillation (HCC)    Graves disease    Abdominal bloating    Atrial fibrillation with RVR ([place]) 10/01/2019   Hyperthyroidism    Atrial fibrillation with rapid ventricular response ([place]) 09/30/2019   Leg edema    Left leg cellulitis    Special screening for malignant neoplasms, colon    Polyp of sigmoid colon    Benign neoplasm of ascending colon    Rectal polyp    Herniated nucleus pulposus, L5-S1 11/09/2015   Low back pain 10/25/2015   Herniated nucleus pulposus, C5-6 right 10/06/2015   Dysesthesia 09/16/2015   Spasticity 09/16/2015   Unsteady gait 09/16/2015   Multiple sclerosis ([place]) 06/08/2011    Andrey Spearman, MS, OTR/L, CLT-LANA 08/30/21 2:14 PM    [place] MAIN [facility] SERVICES [address] [place], [place], [address] Phone: [phone]   Fax:  [phone]  Name: NAVEEN CLARDY MRN: [id number] Date of Birth: [birth year]

## 2021-09-04 ENCOUNTER — Ambulatory Visit: Payer: Medicare PPO | Admitting: Cardiology

## 2021-09-04 ENCOUNTER — Encounter: Payer: Self-pay | Admitting: Cardiology

## 2021-09-04 ENCOUNTER — Other Ambulatory Visit: Payer: Self-pay

## 2021-09-04 VITALS — BP 120/76 | HR 83 | Ht 74.0 in | Wt 254.0 lb

## 2021-09-04 DIAGNOSIS — E0590 Thyrotoxicosis, unspecified without thyrotoxic crisis or storm: Secondary | ICD-10-CM | POA: Diagnosis not present

## 2021-09-04 DIAGNOSIS — I4819 Other persistent atrial fibrillation: Secondary | ICD-10-CM | POA: Diagnosis not present

## 2021-09-04 NOTE — Progress Notes (Signed)
Cardiology Office Note:    Date:  09/04/2021   ID:  Dustin Gray, DOB 14-Oct-1955, MRN [id number]  PCP:  [name], MD  Cardiologist:  [name], MD  Electrophysiologist:  [name], MD   Referring MD: [name], MD   Chief Complaint  Patient presents with   Other    4-6 week follow up -- Meds reviewed verbally with patient.     History of Present Illness:    Dustin Gray is a 66 y.o. male with a hx of atrial fibrillation, multiple sclerosis, lymphedema, hypothyroidism/Graves' disease, who presents for follow-up.    Patient being seen for atrial fibrillation.  Heart rates are typically better controlled when thyroid function is adequately managed.  Previously seen for persistent atrial fibrillation, cardioversion was planned but patient canceled due to socioeconomic factors.  His wife had a drug-related problem, apparently was arrested and currently in jail.  He states his stress levels are much improved, would like to schedule cardioversion.    He is tolerating Eliquis without any adverse effects or bleeding issues.  Compliant with Inderal and Cardizem as prescribed.   Prior notes Patient originally seen in the hospital for atrial fibrillation with rapid ventricular response.  He was managed with diltiazem and Eliquis.  He was subsequently diagnosed with hyperthyroidism and propanolol ER added to his medical regimen.   Echocardiogram on 09/2019 showed normal ejection fraction with EF 55 to 60%, left atrial size was also normal.   He was started on anticoagulation due to plan for cardioversion.  Cardioversion was attempted but unsuccessful.  His heart rates have improved when hyperthyroidism is controlled.  Past Medical History:  Diagnosis Date   Abscess    groin   Arthritis    lower spine   Erectile dysfunction    Low testosterone    Lumbar herniated disc    L5   Multiple sclerosis (HCC)    Staph aureus infection     Past Surgical  History:  Procedure Laterality Date   COLONOSCOPY WITH PROPOFOL N/A 07/04/2016   Procedure: COLONOSCOPY WITH PROPOFOL;  Surgeon: [name], MD;  Location: [facility];  Service: Endoscopy;  Laterality: N/A;   [place]   POLYPECTOMY  07/04/2016   Procedure: POLYPECTOMY;  Surgeon: [name], MD;  Location: [facility];  Service: Endoscopy;;   TONSILLECTOMY      Current Medications: Current Meds  Medication Sig   apixaban (ELIQUIS) 5 MG TABS tablet Take 1 tablet (5 mg total) by mouth 2 (two) times daily.   baclofen (LIORESAL) 10 MG tablet Take 10 mg by mouth 3 (three) times daily.   diltiazem (CARDIZEM SR) 120 MG 12 hr capsule Take 1 capsule (120 mg total) by mouth 2 (two) times daily.   Dimethyl Fumarate 240 MG CPDR Take 240 mg by mouth 2 (two) times daily.    furosemide (LASIX) 40 MG tablet Take 1 tablet (40 mg total) by mouth as needed. With your potassium.   gabapentin (NEURONTIN) 300 MG capsule Take 300-600 mg by mouth See admin instructions. Take 1 capsule (300 mg) by mouth in the morning, take 1 capsule (300 mg) by mouth at midday, & take 2 capsules (600 mg) by mouth at bedtime.   meloxicam (MOBIC) 15 MG tablet Take 15 mg by mouth daily as needed for pain.    methimazole (TAPAZOLE) 10 MG tablet Take 10 mg by mouth in the morning.   potassium chloride (KLOR-CON)  10 MEQ tablet Take 1 tablet (10 mEq total) by mouth as needed. Take with your Lasix.   propranolol ER (INDERAL LA) 80 MG 24 hr capsule Take 1 capsule (80 mg total) by mouth 2 (two) times daily.     Allergies:   Cephalexin, Ancef [cefazolin sodium], and Cefadroxil   Social History   Socioeconomic History   Marital status: Legally Separated    Spouse name: Not on file   Number of children: Not on file   Years of education: Not on file   Highest education level: Not on file  Occupational History   Not on file  Tobacco Use   Smoking status: Former   Smokeless tobacco:  Never   Tobacco comments:    smoked as teenager  Substance and Sexual Activity   Alcohol use: Yes    Comment: 2 drinks/month   Drug use: No   Sexual activity: Not on file  Other Topics Concern   Not on file  Social History Narrative   Not on file   Social Determinants of Health   Financial Resource Strain: Not on file  Food Insecurity: Not on file  Transportation Needs: Not on file  Physical Activity: Not on file  Stress: Not on file  Social Connections: Not on file     Family History: The patient's family history includes Diabetes in his father.  ROS:   Please see the history of present illness.     All other systems reviewed and are negative.  EKGs/Labs/Other Studies Reviewed:    The following studies were reviewed today:   EKG:  EKG is  ordered today.  The ekg ordered today demonstrates atrial fibrillation  heart rate 83  Recent Labs: 08/16/2021: BUN 8; Creatinine, Ser 0.95; Hemoglobin 16.8; Platelets 228; Potassium 4.7; Sodium 140  Recent Lipid Panel    Component Value Date/Time   CHOL 108 10/01/2019 0358   TRIG 81 10/01/2019 0358   HDL 35 (L) 10/01/2019 0358   CHOLHDL 3.1 10/01/2019 0358   VLDL 16 10/01/2019 0358   LDLCALC 57 10/01/2019 0358    Physical Exam:    VS:  BP 120/76 (BP Location: Left Arm, Patient Position: Sitting, Cuff Size: Normal)    Pulse 83    Ht '6\' 2"'$  (1.88 m)    Wt 254 lb (115.2 kg)    SpO2 98%    BMI 32.61 kg/m     Wt Readings from Last 3 Encounters:  09/04/21 254 lb (115.2 kg)  08/16/21 250 lb (113.4 kg)  07/31/21 253 lb (114.8 kg)     GEN:  Well nourished, well developed in no acute distress HEENT: Normal NECK: No JVD; No carotid bruits LYMPHATICS: No lymphadenopathy CARDIAC: Irregularly irregular RESPIRATORY:  Clear to auscultation without rales, wheezing or rhonchi  ABDOMEN: Soft, non-tender, non-distended MUSCULOSKELETAL:  no edema; No deformity  SKIN: Warm and dry NEUROLOGIC:  Alert and oriented x 3 PSYCHIATRIC:   Normal affect   ASSESSMENT:    1. Persistent atrial fibrillation ([place])   2. Hyperthyroidism     PLAN:    In order of problems listed above:  Persistent atrial fibrillation.  EKG today shows atrial fibrillation, heart rate 83 .  Thyroid function appears adequately controlled.  Recent TSH, T3 appears controlled.  T4 low..  Continue Cardizem SR 120 twice daily, continue Inderal, continue Eliquis 5 mg twice daily.   CHA2DS2-VASc score 0.  Plan DC cardioversion  if patient still in atrial fibrillation.  Repeat echo as scheduled. hyperthyroidism.  On  methimazole. management as per endocrinology.  Follow-up in 6 weeks  Total encounter time 40 minutes  Greater than 50% was spent in counseling and coordination of care with the patient  Shared Decision Making/Informed Consent The risks (stroke, cardiac arrhythmias rarely resulting in the need for a temporary or permanent pacemaker, skin irritation or burns and complications associated with conscious sedation including aspiration, arrhythmia, respiratory failure and death), benefits (restoration of normal sinus rhythm) and alternatives of a direct current cardioversion were explained in detail to Dustin Gray and he agrees to proceed.        Medication Adjustments/Labs and Tests Ordered: Current medicines are reviewed at length with the patient today.  Concerns regarding medicines are outlined above.  Orders Placed This Encounter  Procedures   CBC   Basic metabolic panel   EKG 83-TDVV     No orders of the defined types were placed in this encounter.     Patient Instructions  Medication Instructions:   Your physician recommends that you continue on your current medications as directed. Please refer to the Current Medication list given to you today.  *If you need a refill on your cardiac medications before your next appointment, please call your pharmacy*   Lab Work:  Your physician recommends that you return for lab work (CBC,  BMP) in: Week of 09/18/21    Please return to our office on_____________________at______________am/pm     Testing/Procedures:  You are scheduled for a Cardioversion on ___Wednesday 4/5/23______with Dr.__Agbor-Etang_________ Please arrive at the [facility] at __0630_______ a.m. on the day of your procedure.   DIET INSTRUCTIONS:   Nothing to eat or drink after midnight except your medications with a sip of water.          Labs: __The week prior to your Cardioversion   Medications:  YOU MAY TAKE ALL of your remaining medications with a small amount of water.   Must have a responsible person to drive you home.   Bring a current list of your medications and current insurance cards.          If you have any questions after you get home, please call the office at [phone]      Follow-Up: At [facility], you and your health needs are our priority.  As part of our continuing mission to provide you with exceptional heart care, we have created designated Provider Care Teams.  These Care Teams include your primary Cardiologist (physician) and Advanced Practice Providers (APPs -  Physician Assistants and Nurse Practitioners) who all work together to provide you with the care you need, when you need it.  We recommend signing up for the patient portal called "MyChart".  Sign up information is provided on this After Visit Summary.  MyChart is used to connect with patients for Virtual Visits (Telemedicine).  Patients are able to view lab/test results, encounter notes, upcoming appointments, etc.  Non-urgent messages can be sent to your provider as well.   To learn more about what you can do with MyChart, go to [email or web address].    Your next appointment:   First week of May   The format for your next appointment:   In Person  Provider:   [name], MD    Other Instructions     Signed, [name], MD  09/04/2021 1:14 PM    [place]

## 2021-09-04 NOTE — Patient Instructions (Signed)
Medication Instructions:  ? ?Your physician recommends that you continue on your current medications as directed. Please refer to the Current Medication list given to you today. ? ?*If you need a refill on your cardiac medications before your next appointment, please call your pharmacy* ? ? ?Lab Work: ? ?Your physician recommends that you return for lab work (CBC, BMP) in: Week of 09/18/21 ? ? ? ?Please return to our office on_____________________at______________am/pm ? ? ? ? ?Testing/Procedures: ? ?You are scheduled for a Cardioversion on ___Wednesday 4/5/23______with Dr.__Agbor-Etang_________ ?Please arrive at the [place] [facility] at __0630_______ a.m. on the day of your procedure. ?  ?DIET INSTRUCTIONS:  ? ?Nothing to eat or drink after midnight except your medications with a sip of water. ? ?      ?  ?Labs: __The week prior to your Cardioversion ?  ?Medications:  YOU MAY TAKE ALL of your remaining medications with a small amount of water. ?  ?Must have a responsible person to drive you home. ?  ?Bring a current list of your medications and current insurance cards.      ?  ?  ?If you have any questions after you get home, please call the office at [phone]  ?  ? ? ?Follow-Up: ?At [facility], you and your health needs are our priority.  As part of our continuing mission to provide you with exceptional heart care, we have created designated Provider Care Teams.  These Care Teams include your primary Cardiologist (physician) and Advanced Practice Providers (APPs -  Physician Assistants and Nurse Practitioners) who all work together to provide you with the care you need, when you need it. ? ?We recommend signing up for the patient portal called "MyChart".  Sign up information is provided on this After Visit Summary.  MyChart is used to connect with patients for Virtual Visits (Telemedicine).  Patients are able to view lab/test results, encounter notes, upcoming appointments, etc.  Non-urgent messages can be  sent to your provider as well.   ?To learn more about what you can do with MyChart, go to [email or web address].   ? ?Your next appointment:   ?First week of May  ? ?The format for your next appointment:   ?In Person ? ?Provider:   ?[name], MD  ? ? ?Other Instructions ? ? ?

## 2021-09-05 ENCOUNTER — Ambulatory Visit: Payer: Medicare PPO | Admitting: Occupational Therapy

## 2021-09-05 DIAGNOSIS — R269 Unspecified abnormalities of gait and mobility: Secondary | ICD-10-CM | POA: Diagnosis not present

## 2021-09-05 DIAGNOSIS — R2681 Unsteadiness on feet: Secondary | ICD-10-CM | POA: Diagnosis not present

## 2021-09-05 DIAGNOSIS — I890 Lymphedema, not elsewhere classified: Secondary | ICD-10-CM

## 2021-09-05 DIAGNOSIS — R262 Difficulty in walking, not elsewhere classified: Secondary | ICD-10-CM | POA: Diagnosis not present

## 2021-09-05 DIAGNOSIS — M6281 Muscle weakness (generalized): Secondary | ICD-10-CM | POA: Diagnosis not present

## 2021-09-05 NOTE — Therapy (Signed)
[facility] ?[place], [place], [address] ?Phone: [phone]   Fax:  [phone] ? ?Occupational Therapy Treatment ? ?Patient Details  ?Name: Dustin Gray ?MRN: [id number] ?Date of Birth: Jan 07, 1956 ?Referring Provider (OT): Staci Acosta, MD ? ? ?Encounter Date: 09/05/2021 ? ? OT End of Session - 09/05/21 1017   ? ? Visit Number 16   ? Number of Visits 36   ? Date for OT Re-Evaluation 08/23/21   ? OT Start Time 1007   ? OT Stop Time 1107   ? OT Time Calculation (min) 60 min   ? Activity Tolerance Patient tolerated treatment well;No increased pain   ? Behavior During Therapy [facility] for tasks assessed/performed   ? ?  ?  ? ?  ? ? ?Past Medical History:  ?Diagnosis Date  ? Abscess   ? groin  ? Arthritis   ? lower spine  ? Erectile dysfunction   ? Low testosterone   ? Lumbar herniated disc   ? L5  ? Multiple sclerosis ([place])   ? Staph aureus infection   ? ? ?Past Surgical History:  ?Procedure Laterality Date  ? COLONOSCOPY WITH PROPOFOL N/A 07/04/2016  ? Procedure: COLONOSCOPY WITH PROPOFOL;  Surgeon: [name], MD;  Location: [facility];  Service: Endoscopy;  Laterality: N/A;  ? [place]  ? MASS EXCISION  1960  ? POLYPECTOMY  07/04/2016  ? Procedure: POLYPECTOMY;  Surgeon: [name], MD;  Location: [facility];  Service: Endoscopy;;  ? TONSILLECTOMY    ? ? ?There were no vitals filed for this visit. ? ? Subjective Assessment - 09/05/21 1019   ? ? Subjective  Dustin Gray presents for OT visit 16/36 to address BLE lymphedema. Pt denies LE related leg pain. Pt has no new concerns today.   ? Pertinent History relevant to LE: HTN, MS, Herniated C-5-C6, L5-S1, chronic low back pain, persistent Afib,Hx LLE cellulitis, Graves disease, HYPERthyroidism, OA   ? Limitations difficulty walking, impaired functional mobility and transfers, impaired balance, muscle weakness, L>R,, altered sensation, chronic back pain ,  chronic leg swelling and associated pain, spasticity in legs   ? Repetition Increases Symptoms   ? Special Tests +Stemmer sign, L>R; Intake FOTO: 51/100   ? Patient Stated Goals Learn about lymphedema and explore treatment options   ? Currently in Pain? No/denies   ? Pain Onset More than a month ago   ? Pain Onset More than a month ago   ? ?  ?  ? ?  ? ? ? ? ? ? ? ? ? ? ? ? ? ? ? OT Treatments/Exercises (OP) - 09/05/21 1020   ? ?  ? ADLs  ? ADL Education Given Yes   ?  ? Manual Therapy  ? Manual Therapy Edema management;Compression Bandaging;Manual Lymphatic Drainage (MLD)   ? Manual Lymphatic Drainage (MLD) MLD to LLE/LLQ utilizing short neck sequence, diaphragmatic breathing, functional inguinal LN and proximal nto distal J strokes to each lower extremity segment. Fially repeat 3 retrograde sweeps to terminus and repeat short neck.   ? Compression Bandaging 4 layer knee length compression wrap , including foot , from base of toes to popliteal fossa using stockinett as base layer, then single layer Rosidal foam under 1 each 8,10 and 12 cm wide short stretch bandages.   ? ?  ?  ? ?  ? ? ? ? ? ? ? ? ? OT Education - 09/05/21 1021   ? ?  Education Details Continued Pt/ CG edu for lymphedema self care  and home program throughout session. Topics include multilayer, gradient compression wrapping, simple self-MLD, therapeutic lymphatic pumping exercises, skin/nail care, risk reduction factors and LE precautions, compression garments/recommendations and wear and care schedule and compression garment donning / doffing using assistive devices. All questions answered to the Pt's satisfaction, and Pt demonstrates understanding by report.   ? Person(s) Educated Patient   ? Methods Explanation;Demonstration;Handout   ? Comprehension Verbalized understanding;Returned demonstration;Need further instruction   ? ?  ?  ? ?  ? ? ? ? ? ? OT Long Term Goals - 08/10/21 1319   ? ?  ? OT LONG TERM GOAL #1  ? Title Given this patient?s  Intake score of 51/100 on the functional outcomes FOTO tool, patient will experience an increase in function of 5 points to improve basic and instrumental ADLs performance, including lymphedema self-care.   ? Baseline Max A   ? Time 12   ? Period Weeks   ? Status Partially Met   08/08/21 ( OT visit 9) increased 2 points tfrom 51/100 initially to 53100 today.  ?  ? OT LONG TERM GOAL #2  ? Title Pt will be able to apply knee length, multi-layer, short stretch compression wraps to one limb at a time using gradient techniques with MAX CG ASSISTANCE to decrease limb volume, to limit infection risk, and to limit lymphedema progression.   ? Baseline Dependent   ? Time 4   ? Period Days   ? Status Achieved   Pt met and exceeded this goal as he is able to wrap independently  ?  ? OT LONG TERM GOAL #3  ? Title Pt will demonstrate understanding of lymphedema prevention strategies by identifying and discussing 5 precautions using printed reference (modified assistance) to reduce risk of progression and to limit infection risk.   ? Baseline Max A   ? Time 4   ? Period Days   ? Status Achieved   ? Target Date --   4th OT Rx visit  ?  ? OT LONG TERM GOAL #4  ? Title Pt will achieve at least a 10% limb volume reduction in bilateral legs to return limb to normal size and shape,  to limit lymphedema progression and to limit infection risk.   ? Baseline mAx   ? Time 12   ? Period Weeks   ? Status Partially Met   LLE reduced by 18.8 % in 10 visits.  ? Target Date 08/23/21   ?  ? OT LONG TERM GOAL #5  ? Title With MAX CG ASSISTANCE Pt will achieve and sustain a least 85% compliance with all 4 LE self-care home program components throughout Intensive Phase CDT, including modified simple self-MLD, daily skin inspection and care, lymphatic pumping the ex, 23/7 compression wraps to sustain clinical gains made in CDT and to limit lymphedema progression and further functional decline.   ? Baseline Dependent   ? Time 12   ? Period Weeks   ?  Status Achieved   Met and exceeded goal. Pt modified independent (extra time) with all home program components and consistently 85% compliant w LE self care  ? Target Date 08/23/21   ? ?  ?  ? ?  ? ? ? ? ? ? ? ? Plan - 09/05/21 1418   ? ? Clinical Impression Statement Cont LLE/LLQ MLD  as established without increased pain. Skin condition continues to improve. Reapplied multilayer wraps as  established. Will fit custom LLE compression wrap as soon as delivered. CONT as per POC.   ? OT Occupational Profile and History Comprehensive Assessment- Review of records and extensive additional review of physical, cognitive, psychosocial history related to current functional performance   ? Occupational performance deficits (Please refer to evaluation for details): ADL's;IADL's;Work;Leisure;Social Participation;Other   role performance  ? Body Structure / Function / Physical Skills ADL;Edema;Skin integrity;Flexibility;Pain;ROM;Decreased knowledge of use of DME;Scar mobility;IADL   ? Rehab Potential Good   ? Clinical Decision Making Several treatment options, min-mod task modification necessary   ? Comorbidities Affecting Occupational Performance: Presence of comorbidities impacting occupational performance   ? Modification or Assistance to Complete Evaluation  Min-Moderate modification of tasks or assist with assess necessary to complete eval   ? OT Frequency 2x / week   ? OT Duration 12 weeks   and PRN for follow along  ? OT Treatment/Interventions Self-care/ADL training;Therapeutic exercise;Manual Therapy;Coping strategies training;Therapeutic activities;Manual lymph drainage;DME and/or AE instruction;Compression bandaging;Other (comment);Patient/family education   skin care to limit infection risk  ? Plan Complete Decongestive Therapy (CDT) One leg at a time to limit fall LLE first. Manual lymphatic drainage (MLD), skin care, ther ex, compression wraps, then   ? OT Home Exercise Plan Pt verbalized understanding that he  requires assistance with all lymphedema home care components, especially compression wrapping, between visits for optimal prognosis. Without assistance prognosis is poor   ? Recommended Other Services In insurance ben

## 2021-09-05 NOTE — Patient Instructions (Signed)

## 2021-09-07 NOTE — Telephone Encounter (Addendum)
After unsuccessful attempts to reach patient, he was seen in the office by [name], MD. DCCV has been rescheduled.  ?

## 2021-09-12 ENCOUNTER — Ambulatory Visit: Payer: Medicare PPO | Admitting: Occupational Therapy

## 2021-09-12 ENCOUNTER — Ambulatory Visit: Payer: Medicare PPO

## 2021-09-12 ENCOUNTER — Other Ambulatory Visit: Payer: Self-pay

## 2021-09-12 DIAGNOSIS — R269 Unspecified abnormalities of gait and mobility: Secondary | ICD-10-CM

## 2021-09-12 DIAGNOSIS — R2681 Unsteadiness on feet: Secondary | ICD-10-CM

## 2021-09-12 DIAGNOSIS — M6281 Muscle weakness (generalized): Secondary | ICD-10-CM | POA: Diagnosis not present

## 2021-09-12 DIAGNOSIS — I890 Lymphedema, not elsewhere classified: Secondary | ICD-10-CM | POA: Diagnosis not present

## 2021-09-12 DIAGNOSIS — R262 Difficulty in walking, not elsewhere classified: Secondary | ICD-10-CM | POA: Diagnosis not present

## 2021-09-12 NOTE — Therapy (Signed)
[facility] ?[place], [place], [address] ?Phone: [phone]   Fax:  [phone] ? ?Physical Therapy Treatment ? ?Patient Details  ?Name: Dustin Gray ?MRN: [id number] ?Date of Birth: [birth year] ?No data recorded ? ?Encounter Date: 09/12/2021 ? ? PT End of Session - 09/12/21 1516   ? ? Visit Number 31   ? Number of Visits 57   ? Date for PT Re-Evaluation 10/11/21   ? Authorization Type [employer]   ? Authorization Time Period 01/31/21-04/25/21   ? PT Start Time 1610   ? PT Stop Time 6962   ? PT Time Calculation (min) 40 min   ? Equipment Utilized During Treatment Gait belt   ? Activity Tolerance Patient tolerated treatment well;No increased pain;Patient limited by fatigue   ? Behavior During Therapy [facility] for tasks assessed/performed   ? ?  ?  ? ?  ? ? ?Past Medical History:  ?Diagnosis Date  ? Abscess   ? groin  ? Arthritis   ? lower spine  ? Erectile dysfunction   ? Low testosterone   ? Lumbar herniated disc   ? L5  ? Multiple sclerosis ([place])   ? Staph aureus infection   ? ? ?Past Surgical History:  ?Procedure Laterality Date  ? COLONOSCOPY WITH PROPOFOL N/A 07/04/2016  ? Procedure: COLONOSCOPY WITH PROPOFOL;  Surgeon: [name], MD;  Location: [facility];  Service: Endoscopy;  Laterality: N/A;  ? [place]  ? MASS EXCISION  1960  ? POLYPECTOMY  07/04/2016  ? Procedure: POLYPECTOMY;  Surgeon: [name], MD;  Location: [facility];  Service: Endoscopy;;  ? TONSILLECTOMY    ? ? ?There were no vitals filed for this visit. ? ? Subjective Assessment - 09/12/21 1603   ? ? Subjective Patient's compression stocking has come in and patient is wearing for PT session. Patient reports he is having back pain.   ? Pertinent History Patient presents to physical therapy for unsteadiness, walking instability, fatigue. His PMH includes paroxysmal a fib, MS, lymphedema, hypothyroidism, Graves Disease, arthritis, lumbar herniated  disc (L5), depression and neuropathy. Patient first developed symptoms of MS in 2005 and was diagnosed in 2006. Patient has done PT in the past, but hasn't been seen since 2020 at [facility]. Has not been doing his exercises in the past year. Walk outside to garage to ride lawnmower, walks in house. 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 Limitations Standing;Lifting;Walking;House hold activities   ? How long can you sit comfortably? a couple hours   ? How long can you stand comfortably? no more than 10 minutes before pain, have to steady self immediately   ? How long can you walk comfortably? has to use rollator. can walk in a store with a cart   ? Patient Stated Goals want to get stronger and feel mor comfortable walking around. Patient would like to continue strengthening hips and improve endurance and ability to pace self for safety while walking.   ? Currently in Pain? Yes   ? Pain Score 6    ? Pain Location Back   ? Pain Orientation Lower   ? Pain Descriptors / Indicators Aching   ? Pain Type Chronic pain   ? Pain Onset Yesterday   ? Pain Frequency Intermittent   ? ?  ?  ? ?  ? ? ? ? ? ? ? ? ?  ?  INTERVENTIONS:  ?   ?Manual ?Hamstring lengthening bilateral LE 60 seconds ?Single knee to chest 60 seconds each LE ?Figure 4 stretch 60 seconds each LE ?IR cross body stretch 60 seconds each LE  ?  ?  ?TherEx  ?  ?Supine: ?Adduction ball squeezes 15x 3 second holds ?BTB abduction 15x  ?BTB hip flexion 15x each LE  ?SAQ over bolster 15x each LE with 3 second holds  ?Green swiss ball hamstring curl 15x  ?TrA contraction into green swiss ball 10x 3 second holds ?Posterior pelvic tilt 15x 3 second holds  ?Gluteal squeeze 15x 3 second holds ? ?Pt educated throughout session about proper posture and technique with exercises.  ?Improved exercise technique, movement at target joints, use of target muscles after min to mod verbal, visual, tactile cues ? ? ? ? ?Patient's session  limited by back pain requiring modification. Patient tolerates interventions well. His LLE fatigues quicker than RLE. He is able to transition onto/off of bed without assistance. Occasional cueing for task orientation required. Patient will continue to benefit from skilled physical therapy intervention in order to improve his strength, ambulatory capacity, balance, and increase his overall function and safety with everyday ambulation ? ? ? ? ? ? ? ? ? ? ? ? ? ? ? PT Education - 09/12/21 1603   ? ? Education Details exercise technique, body mechanics   ? Person(s) Educated Patient   ? Methods Explanation;Demonstration;Tactile cues;Verbal cues   ? Comprehension Verbalized understanding;Returned demonstration;Verbal cues required;Tactile cues required   ? ?  ?  ? ?  ? ? ? PT Short Term Goals - 08/22/21 1358   ? ?  ? PT SHORT TERM GOAL #1  ? Title Patient will be independent in home exercise program to improve strength/mobility for better functional independence with ADLs.   ? Baseline 8/9: HEP to be given next session, 10/31: patient reports compliance with HEP and would like progressions added next session. 08/22/2021=Patient verbalized that he is walking and doing his LE exercises with no questions at this time.   ? Time 4   ? Period Weeks   ? Status Achieved   ? Target Date 08/16/21   ? ?  ?  ? ?  ? ? ? ? PT Long Term Goals - 08/22/21 1355   ? ?  ? PT LONG TERM GOAL #1  ? Title Patient will increase FOTO score to equal to or greater than 70%    to demonstrate statistically significant improvement in mobility and quality of life.   ? Baseline 8/9: 64%; risk adjusted 34%; 03/21/21 FOTO: 45; 04/24/21 FOTO: 49% ; 08/22/2021= 56%   ? Time 12   ? Period Weeks   ? Status Partially Met   ? Target Date 10/11/21   ?  ? PT LONG TERM GOAL #2  ? Title Patient will increase Berg Balance score by > 6 points to demonstrate decreased fall risk during functional activities.   ? Baseline 8/9: BBT 29/56; 03/21/21: 42/56   ? Time 12   ?  Period Weeks   ? Status Achieved   ?  ? PT LONG TERM GOAL #3  ? Title Patient will increase 10 meter walk test to >1.40ms as to improve gait speed for better community ambulation and to reduce fall risk.   ? Baseline 8/9: 0.65 m/s with rollator; 10/31: 0.68 m/s with rollator. 07/06/2021= 0.71 m/s using rollator; 08/22/2021= 0.94 m/s using rollator.   ? Time 12   ? Period Weeks   ?  Status On-going   ? Target Date 10/11/21   ?  ? PT LONG TERM GOAL #4  ? Title Patient will increase six minute walk test distance to >1000 for progression to community ambulator and improve gait ability   ? Baseline 8/9: 505 ft with rollator; 03/21/21: 762f c rollator, 04/24/21: [id number] rollator; 07/06/2021=900 feet; 08/22/2021= 935 feet with use of Rollator   ? Time 12   ? Period Weeks   ? Status On-going   ? Target Date 10/11/21   ?  ? PT LONG TERM GOAL #5  ? Title Patient will increase glute medius strength on Left LE from 3-/5 to 4/5 to improve stability, gait mechanics, and functional strength.   ? Baseline 10/31: 3-/5; 07/06/2021= 3-/5; 08/22/2021=3-/5 unable to achieve full ROM yet able to withstand some resistance with testing   ? Time 12   ? Period Weeks   ? Status On-going   ? Target Date 10/11/21   ?  ? PT LONG TERM GOAL #6  ? Title Patient will increase Left single leg stance time to 15 seconds or greater to increase safety in shower and independence with ADLs.   ? Baseline 10/31; 1.83 sec without UE support; 08/22/2021=2-3  sec  on left; 6 sec on right   ? Time 12   ? Period Weeks   ? Status On-going   ? Target Date 10/11/21   ? ?  ?  ? ?  ? ? ? ? ? ? ? ? Plan - 09/12/21 1635   ? ? Clinical Impression Statement Patient's session limited by back pain requiring modification. Patient tolerates interventions well. His LLE fatigues quicker than RLE. He is able to transition onto/off of bed without assistance. Occasional cueing for task orientation required. Patient will continue to benefit from skilled physical therapy intervention in  order to improve his strength, ambulatory capacity, balance, and increase his overall function and safety with everyday ambulation   ? Personal Factors and Comorbidities Age;Comorbidity 3+;Fitness;Past/Current

## 2021-09-13 ENCOUNTER — Ambulatory Visit (INDEPENDENT_AMBULATORY_CARE_PROVIDER_SITE_OTHER): Payer: Medicare PPO

## 2021-09-13 DIAGNOSIS — E0590 Thyrotoxicosis, unspecified without thyrotoxic crisis or storm: Secondary | ICD-10-CM | POA: Diagnosis not present

## 2021-09-13 DIAGNOSIS — I4819 Other persistent atrial fibrillation: Secondary | ICD-10-CM | POA: Diagnosis not present

## 2021-09-13 LAB — ECHOCARDIOGRAM COMPLETE
AR max vel: 1.95 cm2
AV Area VTI: 1.89 cm2
AV Area mean vel: 1.78 cm2
AV Mean grad: 6 mmHg
AV Peak grad: 9.2 mmHg
Ao pk vel: 1.52 m/s
S' Lateral: 3.4 cm
Single Plane A4C EF: 49.8 %

## 2021-09-13 MED ORDER — PERFLUTREN LIPID MICROSPHERE
1.0000 mL | INTRAVENOUS | Status: AC | PRN
  Administered 2021-09-13: 2 mL via INTRAVENOUS

## 2021-09-14 ENCOUNTER — Encounter: Payer: Medicare PPO | Admitting: Occupational Therapy

## 2021-09-18 ENCOUNTER — Other Ambulatory Visit: Payer: Self-pay

## 2021-09-18 ENCOUNTER — Other Ambulatory Visit: Payer: Medicare PPO

## 2021-09-18 DIAGNOSIS — I4819 Other persistent atrial fibrillation: Secondary | ICD-10-CM

## 2021-09-19 ENCOUNTER — Ambulatory Visit: Payer: Medicare PPO | Admitting: Occupational Therapy

## 2021-09-19 DIAGNOSIS — R262 Difficulty in walking, not elsewhere classified: Secondary | ICD-10-CM | POA: Diagnosis not present

## 2021-09-19 DIAGNOSIS — R269 Unspecified abnormalities of gait and mobility: Secondary | ICD-10-CM | POA: Diagnosis not present

## 2021-09-19 DIAGNOSIS — I890 Lymphedema, not elsewhere classified: Secondary | ICD-10-CM | POA: Diagnosis not present

## 2021-09-19 DIAGNOSIS — M6281 Muscle weakness (generalized): Secondary | ICD-10-CM | POA: Diagnosis not present

## 2021-09-19 DIAGNOSIS — R2681 Unsteadiness on feet: Secondary | ICD-10-CM | POA: Diagnosis not present

## 2021-09-19 LAB — CBC
Hematocrit: 42.5 % (ref 37.5–51.0)
Hemoglobin: 14.8 g/dL (ref 13.0–17.7)
MCH: 30.3 pg (ref 26.6–33.0)
MCHC: 34.8 g/dL (ref 31.5–35.7)
MCV: 87 fL (ref 79–97)
Platelets: 208 10*3/uL (ref 150–450)
RBC: 4.88 x10E6/uL (ref 4.14–5.80)
RDW: 14.3 % (ref 11.6–15.4)
WBC: 7 10*3/uL (ref 3.4–10.8)

## 2021-09-19 LAB — BASIC METABOLIC PANEL
BUN/Creatinine Ratio: 14 (ref 10–24)
BUN: 13 mg/dL (ref 8–27)
CO2: 22 mmol/L (ref 20–29)
Calcium: 9.1 mg/dL (ref 8.6–10.2)
Chloride: 109 mmol/L — ABNORMAL HIGH (ref 96–106)
Creatinine, Ser: 0.93 mg/dL (ref 0.76–1.27)
Glucose: 94 mg/dL (ref 70–99)
Potassium: 4.5 mmol/L (ref 3.5–5.2)
Sodium: 145 mmol/L — ABNORMAL HIGH (ref 134–144)
eGFR: 91 mL/min/{1.73_m2} (ref >59)

## 2021-09-19 NOTE — Patient Instructions (Signed)

## 2021-09-19 NOTE — Therapy (Signed)
[place] [facility] ?[place], [place], [address] ?Phone: [phone]   Fax:  [phone] ? ?Occupational Therapy Treatment ? ?Patient Details  ?Name: Dustin Gray ?MRN: [id number] ?Date of Birth: [birth year] ?Referring Provider (OT): Staci Acosta, MD ? ? ?Encounter Date: 09/19/2021 ? ? OT End of Session - 09/19/21 1409   ? ? Visit Number 17   ? Number of Visits 36   ? Date for OT Re-Evaluation 08/23/21   ? Activity Tolerance Patient tolerated treatment well;No increased pain   ? Behavior During Therapy [facility] for tasks assessed/performed   ? ?  ?  ? ?  ? ? ?Past Medical History:  ?Diagnosis Date  ? Abscess   ? groin  ? Arthritis   ? lower spine  ? Erectile dysfunction   ? Low testosterone   ? Lumbar herniated disc   ? L5  ? Multiple sclerosis ([place])   ? Staph aureus infection   ? ? ?Past Surgical History:  ?Procedure Laterality Date  ? COLONOSCOPY WITH PROPOFOL N/A 07/04/2016  ? Procedure: COLONOSCOPY WITH PROPOFOL;  Surgeon: [name], MD;  Location: [place];  Service: Endoscopy;  Laterality: N/A;  ? [place]  ? MASS EXCISION  1960  ? POLYPECTOMY  07/04/2016  ? Procedure: POLYPECTOMY;  Surgeon: [name], MD;  Location: [place];  Service: Endoscopy;;  ? TONSILLECTOMY    ? ? ?There were no vitals filed for this visit. ? ? Subjective Assessment - 09/19/21 1409   ? ? Subjective  Dustin Gray presents for OT visit 18/36 to address BLE lymphedema. Pt denies LE related leg pain. Pt  presents with custom compression stocking in place. Pt reports he is donning independently without difficulty.   ? Pertinent History relevant to LE: HTN, MS, Herniated C-5-C6, L5-S1, chronic low back pain, persistent Afib,Hx LLE cellulitis, Graves disease, HYPERthyroidism, OA   ? Limitations difficulty walking, impaired functional mobility and transfers, impaired balance, muscle weakness, L>R,, altered sensation, chronic back pain ,  chronic leg swelling and associated pain, spasticity in legs   ? Repetition Increases Symptoms   ? Special Tests +Stemmer sign, L>R; Intake FOTO: 51/100   ? Patient Stated Goals Learn about lymphedema and explore treatment options   ? Pain Onset Yesterday   ? Pain Onset More than a month ago   ? ?  ?  ? ?  ? ? ? ? ? ? ? ? ? ? ? ? ? ? ? ? ? ? ? ? ? ? ? OT Education - 09/19/21 1412   ? ? Education Details Continued Pt/ CG edu for lymphedema self care  and home program throughout session. Topics include multilayer, gradient compression wrapping, simple self-MLD, therapeutic lymphatic pumping exercises, skin/nail care, risk reduction factors and LE precautions, compression garments/recommendations and wear and care schedule and compression garment donning / doffing using assistive devices. All questions answered to the Pt's satisfaction, and Pt demonstrates understanding by report.   ? Person(s) Educated Patient   ? Methods Explanation;Demonstration;Handout   ? Comprehension Verbalized understanding;Returned demonstration;Need further instruction   ? ?  ?  ? ?  ? ? ? ? ? ? OT Long Term Goals - 08/10/21 1319   ? ?  ? OT LONG TERM GOAL #1  ? Title Given this patient?s Intake score of 51/100 on the functional outcomes FOTO tool, patient will experience an increase in function of 5 points to improve basic and instrumental  ADLs performance, including lymphedema self-care.   ? Baseline Max A   ? Time 12   ? Period Weeks   ? Status Partially Met   08/08/21 ( OT visit 9) increased 2 points tfrom 51/100 initially to 53100 today.  ?  ? OT LONG TERM GOAL #2  ? Title Pt will be able to apply knee length, multi-layer, short stretch compression wraps to one limb at a time using gradient techniques with MAX CG ASSISTANCE to decrease limb volume, to limit infection risk, and to limit lymphedema progression.   ? Baseline Dependent   ? Time 4   ? Period Days   ? Status Achieved   Pt met and exceeded this goal as he is able to wrap  independently  ?  ? OT LONG TERM GOAL #3  ? Title Pt will demonstrate understanding of lymphedema prevention strategies by identifying and discussing 5 precautions using printed reference (modified assistance) to reduce risk of progression and to limit infection risk.   ? Baseline Max A   ? Time 4   ? Period Days   ? Status Achieved   ? Target Date --   4th OT Rx visit  ?  ? OT LONG TERM GOAL #4  ? Title Pt will achieve at least a 10% limb volume reduction in bilateral legs to return limb to normal size and shape,  to limit lymphedema progression and to limit infection risk.   ? Baseline mAx   ? Time 12   ? Period Weeks   ? Status Partially Met   LLE reduced by 18.8 % in 10 visits.  ? Target Date 08/23/21   ?  ? OT LONG TERM GOAL #5  ? Title With MAX CG ASSISTANCE Pt will achieve and sustain a least 85% compliance with all 4 LE self-care home program components throughout Intensive Phase CDT, including modified simple self-MLD, daily skin inspection and care, lymphatic pumping the ex, 23/7 compression wraps to sustain clinical gains made in CDT and to limit lymphedema progression and further functional decline.   ? Baseline Dependent   ? Time 12   ? Period Weeks   ? Status Achieved   Met and exceeded goal. Pt modified independent (extra time) with all home program components and consistently 85% compliant w LE self care  ? Target Date 08/23/21   ? ?  ?  ? ?  ? ? ? ? ? ? ? ? Plan - 09/19/21 1410   ? ? Clinical Impression Statement Pt tolerated  RLE/RLQ MLD wo increased pain. Limb volume in RLE is significantly less than it was when we commenced CDT to the LLE.  Applied gradient compression to RLE as established. Good session.   ? OT Occupational Profile and History Comprehensive Assessment- Review of records and extensive additional review of physical, cognitive, psychosocial history related to current functional performance   ? Occupational performance deficits (Please refer to evaluation for details):  ADL's;IADL's;Work;Leisure;Social Participation;Other   role performance  ? Body Structure / Function / Physical Skills ADL;Edema;Skin integrity;Flexibility;Pain;ROM;Decreased knowledge of use of DME;Scar mobility;IADL   ? Rehab Potential Good   ? Clinical Decision Making Several treatment options, min-mod task modification necessary   ? Comorbidities Affecting Occupational Performance: Presence of comorbidities impacting occupational performance   ? Modification or Assistance to Complete Evaluation  Min-Moderate modification of tasks or assist with assess necessary to complete eval   ? OT Frequency 2x / week   ? OT Duration 12 weeks  and PRN for follow along  ? OT Treatment/Interventions Self-care/ADL training;Therapeutic exercise;Manual Therapy;Coping strategies training;Therapeutic activities;Manual lymph drainage;DME and/or AE instruction;Compression bandaging;Other (comment);Patient/family education   skin care to limit infection risk  ? Plan Complete Decongestive Therapy (CDT) One leg at a time to limit fall LLE first. Manual lymphatic drainage (MLD), skin care, ther ex, compression wraps, then   ? OT Home Exercise Plan Pt verbalized understanding that he requires assistance with all lymphedema home care components, especially compression wrapping, between visits for optimal prognosis. Without assistance prognosis is poor   ? Recommended Other Services In insurance benefits available, garmentsConsider advanced sequential pneumatic compression device (Flexitouch) to maximize independence w lymphedema self-care at home over time. Pt unable to perform simple self-mld   ? Consulted and Agree with Plan of Care Patient   ? ?  ?  ? ?  ? ? ?Patient will benefit from skilled therapeutic intervention in order to improve the following deficits and impairments:   ?Body Structure / Function / Physical Skills: ADL, Edema, Skin integrity, Flexibility, Pain, ROM, Decreased knowledge of use of DME, Scar mobility, IADL ?  ?   ? ? ?Visit Diagnosis: ?Lymphedema, not elsewhere classified ? ? ? ?Problem List ?Patient Active Problem List  ? Diagnosis Date Noted  ? Persistent atrial fibrillation ([place])   ? Graves disease   ? Abdominal bloating   ? Atrial

## 2021-09-20 ENCOUNTER — Ambulatory Visit: Payer: Medicare PPO

## 2021-09-20 DIAGNOSIS — R262 Difficulty in walking, not elsewhere classified: Secondary | ICD-10-CM | POA: Diagnosis not present

## 2021-09-20 DIAGNOSIS — M6281 Muscle weakness (generalized): Secondary | ICD-10-CM

## 2021-09-20 DIAGNOSIS — R2681 Unsteadiness on feet: Secondary | ICD-10-CM

## 2021-09-20 DIAGNOSIS — I890 Lymphedema, not elsewhere classified: Secondary | ICD-10-CM | POA: Diagnosis not present

## 2021-09-20 DIAGNOSIS — R269 Unspecified abnormalities of gait and mobility: Secondary | ICD-10-CM

## 2021-09-20 NOTE — Therapy (Signed)
[place] [facility] ?[place], [place], [address] ?Phone: [phone]   Fax:  [phone] ? ?Physical Therapy Treatment ? ?Patient Details  ?Name: Dustin Gray ?MRN: [id number] ?Date of Birth: 02/16/1956 ?No data recorded ? ?Encounter Date: 09/20/2021 ? ? PT End of Session - 09/20/21 1700   ? ? Visit Number 44   ? Number of Visits 57   ? Date for PT Re-Evaluation 10/11/21   ? Authorization Type [employer]   ? Authorization Time Period 01/31/21-04/25/21   ? PT Start Time 2951   ? PT Stop Time 8841   ? PT Time Calculation (min) 46 min   ? Equipment Utilized During Treatment Gait belt   ? Activity Tolerance Patient tolerated treatment well;No increased pain;Patient limited by fatigue   ? Behavior During Therapy [facility] for tasks assessed/performed   ? ?  ?  ? ?  ? ? ?Past Medical History:  ?Diagnosis Date  ? Abscess   ? groin  ? Arthritis   ? lower spine  ? Erectile dysfunction   ? Low testosterone   ? Lumbar herniated disc   ? L5  ? Multiple sclerosis ([place])   ? Staph aureus infection   ? ? ?Past Surgical History:  ?Procedure Laterality Date  ? COLONOSCOPY WITH PROPOFOL N/A 07/04/2016  ? Procedure: COLONOSCOPY WITH PROPOFOL;  Surgeon: [name], MD;  Location: [place];  Service: Endoscopy;  Laterality: N/A;  ? [place]  ? MASS EXCISION  1960  ? POLYPECTOMY  07/04/2016  ? Procedure: POLYPECTOMY;  Surgeon: [name], MD;  Location: [place];  Service: Endoscopy;;  ? TONSILLECTOMY    ? ? ?There were no vitals filed for this visit. ? ? Subjective Assessment - 09/20/21 1659   ? ? Subjective Patient had no assistance to get down to PT gym and had to walk down. reports his back pain is improving.   ? Pertinent History Patient presents to physical therapy for unsteadiness, walking instability, fatigue. His PMH includes paroxysmal a fib, MS, lymphedema, hypothyroidism, Graves Disease, arthritis, lumbar herniated disc (L5),  depression and neuropathy. Patient first developed symptoms of MS in 2005 and was diagnosed in 2006. Patient has done PT in the past, but hasn't been [facility] since [facility]. Has not been doing his exercises in the past year. Walk outside to garage to ride lawnmower, walks in house. 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 Limitations Standing;Lifting;Walking;House hold activities   ? How long can you sit comfortably? a couple hours   ? How long can you stand comfortably? no more than 10 minutes before pain, have to steady self immediately   ? How long can you walk comfortably? has to use rollator. can walk in a store with a cart   ? Patient Stated Goals want to get stronger and feel mor comfortable walking around. Patient would like to continue strengthening hips and improve endurance and ability to pace self for safety while walking.   ? Currently in Pain? Yes   ? Pain Score 4    ? Pain Location Back   ? Pain Orientation Lower   ? Pain Descriptors / Indicators Aching   ? Pain Type Chronic pain   ? Pain Onset Yesterday   ? ?  ?  ? ?  ? ? ? ?TherEx: ? ?Nustep Lvl 4 RPM> 60 for cardiovascular challenge x4  minutes ? ?Standing with # 4 ankle weight: CGA for stability ? ?-Hip extension with B upper extremity support, cueing for neutral hip alignment, upright posture for optimal muscle recruitment, and sequencing, 10x each LE,  ?-Hip abduction with B upper extremity support, cueing for neutral foot alignment for correct muscle activation, 10x each LE ?-Hip flexion with B upper extremity support, cueing for body mechanics, speed of muscle recruitment for optimal strengthening and stabilization 10x each LE ?-Hamstring curl with B upper extremity support, cueing for knee alignment for recruitment of hamstring musculature, 10x each LE ? ?Standing heel raise 20x with heavy BUE support  ?10x STS ? ?Seated with # 4 ankle weights  ?-Seated marches with upright posture, back  away from back of chair for abdominal/trunk activation/stabilization, 10x each LE (unable to clear LLE) ?-Seated LAQ with 3 second holds, 10x each LE, cueing for muscle activation and sequencing for neutral alignment ?-Seated IR/ER with cueing for stabilizing knee placement with lateral foot movement for optimal muscle recruitment, 10x each LE; modified for position  ? ? ?Car transfer and transfer of rollator into car  ? ? ? ? ? ?Pt educated throughout session about proper posture and technique with exercises. Improved exercise technique, movement at target joints, use of target muscles after min to mod verbal, visual, tactile cues. ? ? ?Patient tolerates 4lb ankle weights this session despite fatigue from having to walk from car to PT gym. Patient is highly motivated throughout physical therapy session. Patient will continue to benefit from skilled physical therapy intervention in order to improve his strength, ambulatory capacity, balance, and increase his overall function and safety with everyday ambulation ? ? ? ? ? ? ? ? ? ? ? ? ? ? PT Education - 09/20/21 1700   ? ? Education Details exercise technique, body mechanics   ? Person(s) Educated Patient   ? Methods Explanation;Demonstration;Tactile cues;Verbal cues   ? Comprehension Verbalized understanding;Returned demonstration;Verbal cues required;Tactile cues required   ? ?  ?  ? ?  ? ? ? PT Short Term Goals - 08/22/21 1358   ? ?  ? PT SHORT TERM GOAL #1  ? Title Patient will be independent in home exercise program to improve strength/mobility for better functional independence with ADLs.   ? Baseline 8/9: HEP to be given next session, 10/31: patient reports compliance with HEP and would like progressions added next session. 08/22/2021=Patient verbalized that he is walking and doing his LE exercises with no questions at this time.   ? Time 4   ? Period Weeks   ? Status Achieved   ? Target Date 08/16/21   ? ?  ?  ? ?  ? ? ? ? PT Long Term Goals - 08/22/21 1355    ? ?  ? PT LONG TERM GOAL #1  ? Title Patient will increase FOTO score to equal to or greater than 70%    to demonstrate statistically significant improvement in mobility and quality of life.   ? Baseline 8/9: 64%; risk adjusted 34%; 03/21/21 FOTO: 45; 04/24/21 FOTO: 49% ; 08/22/2021= 56%   ? Time 12   ? Period Weeks   ? Status Partially Met   ? Target Date 10/11/21   ?  ? PT LONG TERM GOAL #2  ? Title Patient will increase Berg Balance score by > 6 points to demonstrate decreased fall risk during functional activities.   ? Baseline 8/9: BBT 29/56; 03/21/21: 42/56   ? Time 12   ? Period  Weeks   ? Status Achieved   ?  ? PT LONG TERM GOAL #3  ? Title Patient will increase 10 meter walk test to >1.4ms as to improve gait speed for better community ambulation and to reduce fall risk.   ? Baseline 8/9: 0.65 m/s with rollator; 10/31: 0.68 m/s with rollator. 07/06/2021= 0.71 m/s using rollator; 08/22/2021= 0.94 m/s using rollator.   ? Time 12   ? Period Weeks   ? Status On-going   ? Target Date 10/11/21   ?  ? PT LONG TERM GOAL #4  ? Title Patient will increase six minute walk test distance to >1000 for progression to community ambulator and improve gait ability   ? Baseline 8/9: 505 ft with rollator; 03/21/21: [id number] rollator, 04/24/21: [id number] rollator; 07/06/2021=900 feet; 08/22/2021= 935 feet with use of Rollator   ? Time 12   ? Period Weeks   ? Status On-going   ? Target Date 10/11/21   ?  ? PT LONG TERM GOAL #5  ? Title Patient will increase glute medius strength on Left LE from 3-/5 to 4/5 to improve stability, gait mechanics, and functional strength.   ? Baseline 10/31: 3-/5; 07/06/2021= 3-/5; 08/22/2021=3-/5 unable to achieve full ROM yet able to withstand some resistance with testing   ? Time 12   ? Period Weeks   ? Status On-going   ? Target Date 10/11/21   ?  ? PT LONG TERM GOAL #6  ? Title Patient will increase Left single leg stance time to 15 seconds or greater to increase safety in shower and independence with ADLs.    ? Baseline 10/31; 1.83 sec without UE support; 08/22/2021=2-3  sec  on left; 6 sec on right   ? Time 12   ? Period Weeks   ? Status On-going   ? Target Date 10/11/21   ? ?  ?  ? ?  ? ? ? ? ? ? ? ? Plan - 03/29

## 2021-09-25 ENCOUNTER — Ambulatory Visit: Payer: Medicare PPO | Attending: Physician Assistant | Admitting: Occupational Therapy

## 2021-09-25 ENCOUNTER — Ambulatory Visit: Payer: Medicare PPO

## 2021-09-25 DIAGNOSIS — R262 Difficulty in walking, not elsewhere classified: Secondary | ICD-10-CM | POA: Insufficient documentation

## 2021-09-25 DIAGNOSIS — R278 Other lack of coordination: Secondary | ICD-10-CM | POA: Insufficient documentation

## 2021-09-25 DIAGNOSIS — I890 Lymphedema, not elsewhere classified: Secondary | ICD-10-CM | POA: Insufficient documentation

## 2021-09-25 DIAGNOSIS — M6281 Muscle weakness (generalized): Secondary | ICD-10-CM | POA: Insufficient documentation

## 2021-09-25 DIAGNOSIS — R2689 Other abnormalities of gait and mobility: Secondary | ICD-10-CM | POA: Insufficient documentation

## 2021-09-25 DIAGNOSIS — R2681 Unsteadiness on feet: Secondary | ICD-10-CM | POA: Diagnosis not present

## 2021-09-25 DIAGNOSIS — R269 Unspecified abnormalities of gait and mobility: Secondary | ICD-10-CM | POA: Diagnosis not present

## 2021-09-25 NOTE — Therapy (Signed)
?[facility] ?[place], [place], [address] ?Phone: [phone]   Fax:  [phone] ? ?Physical Therapy Treatment ? ?Patient Details  ?Name: Dustin Gray ?MRN: [id number] ?Date of Birth: Mar 06, 1956 ?No data recorded ? ?Encounter Date: 09/25/2021 ? ? PT End of Session - 09/25/21 1605   ? ? Visit Number 28   ? Number of Visits 57   ? Date for PT Re-Evaluation 10/11/21   ? Authorization Type [employer]   ? Authorization Time Period 01/31/21-04/25/21   ? PT Start Time 8341   ? PT Stop Time 9622   ? PT Time Calculation (min) 38 min   ? Equipment Utilized During Treatment Gait belt   ? Activity Tolerance Patient tolerated treatment well;No increased pain;Patient limited by fatigue   ? Behavior During Therapy [facility] for tasks assessed/performed   ? ?  ?  ? ?  ? ? ?Past Medical History:  ?Diagnosis Date  ? Abscess   ? groin  ? Arthritis   ? lower spine  ? Erectile dysfunction   ? Low testosterone   ? Lumbar herniated disc   ? L5  ? Multiple sclerosis ([place])   ? Staph aureus infection   ? ? ?Past Surgical History:  ?Procedure Laterality Date  ? COLONOSCOPY WITH PROPOFOL N/A 07/04/2016  ? Procedure: COLONOSCOPY WITH PROPOFOL;  Surgeon: [name], MD;  Location: [facility];  Service: Endoscopy;  Laterality: N/A;  ? [place]  ? MASS EXCISION  1960  ? POLYPECTOMY  07/04/2016  ? Procedure: POLYPECTOMY;  Surgeon: [name], MD;  Location: [facility];  Service: Endoscopy;;  ? TONSILLECTOMY    ? ? ?There were no vitals filed for this visit. ? ? Subjective Assessment - 09/25/21 1621   ? ? Subjective Patient reports no falls or LOB, worried he may not be able to get his cardioversion wednesday due to not having a driver.   ? Pertinent History Patient presents to physical therapy for unsteadiness, walking instability, fatigue. His PMH includes paroxysmal a fib, MS, lymphedema, hypothyroidism, Graves Disease, arthritis, lumbar herniated  disc (L5), depression and neuropathy. Patient first developed symptoms of MS in 2005 and was diagnosed in 2006. Patient has done PT in the past, but hasn't been seen since 2020 at [facility]. Has not been doing his exercises in the past year. Walk outside to garage to ride lawnmower, walks in house. 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 Limitations Standing;Lifting;Walking;House hold activities   ? How long can you sit comfortably? a couple hours   ? How long can you stand comfortably? no more than 10 minutes before pain, have to steady self immediately   ? How long can you walk comfortably? has to use rollator. can walk in a store with a cart   ? Patient Stated Goals want to get stronger and feel mor comfortable walking around. Patient would like to continue strengthening hips and improve endurance and ability to pace self for safety while walking.   ? Currently in Pain? Yes   ? Pain Score 4    ? Pain Location Back   ? Pain Orientation Lower;Mid   ? Pain Descriptors / Indicators Aching   ? Pain Type Chronic pain   ? Pain Onset Yesterday   ? Pain Frequency Intermittent   ? ?  ?  ? ?  ? ? ? ? ? ? ?  INTERVENTIONS:  ?  ?By support bar: CGA and use of gait belt unless otherwise stated. ?  ?Neuro re-ed ?  ?-Hangman x 2 games: 1st game- Patient performed with narrowed base of support on airex pad. 2nd game with feet staggered- Mild unsteadiness yet no LOB.  ?  ?Airex pad 4" step modified tandem stance 2x 30 seconds each LE  ?  ?TherEx  ?  ?-Step ups 4"  green step with Min UE support on rails x 12 reps each leg ?  ?-lateral step up/down 4" green step with UE support 12x each side ? ?-Dynamic marching (high knees) with SUE support (mild unsteadiness yet no LOB) 10x each LE ?  ?-hip abduction 12x each LE BUE support ?  ?-Sit to stand with blue airex pad under right LE x 10 reps without UE support. ?  ?-standing heel raises 15x  ? ?-adduction ball squeeze 10x  ?  ?Pt educated  throughout session about proper posture and technique with exercises. Improved exercise technique, movement at target joints, use of target muscles after min to mod verbal, visual, tactile cues. ? ? ? ?Patient tolerated interventions well this session. Occasional hyperextension of L knee noted but patient is able to self correct. SUE support marches are very challenging. Patient is highly motivated throughout physical therapy session. Patient will continue to benefit from skilled physical therapy intervention in order to improve his strength, ambulatory capacity, balance, and increase his overall function and safety with everyday ambulation ? ? ? ? ? ? ? ? ? ? ? ? ? ? ? ? ? ? PT Education - 09/25/21 1604   ? ? Education Details exercise technique, body mechanics   ? Person(s) Educated Patient   ? Methods Explanation;Demonstration;Tactile cues;Verbal cues   ? Comprehension Verbalized understanding;Returned demonstration;Verbal cues required;Tactile cues required   ? ?  ?  ? ?  ? ? ? PT Short Term Goals - 08/22/21 1358   ? ?  ? PT SHORT TERM GOAL #1  ? Title Patient will be independent in home exercise program to improve strength/mobility for better functional independence with ADLs.   ? Baseline 8/9: HEP to be given next session, 10/31: patient reports compliance with HEP and would like progressions added next session. 08/22/2021=Patient verbalized that he is walking and doing his LE exercises with no questions at this time.   ? Time 4   ? Period Weeks   ? Status Achieved   ? Target Date 08/16/21   ? ?  ?  ? ?  ? ? ? ? PT Long Term Goals - 08/22/21 1355   ? ?  ? PT LONG TERM GOAL #1  ? Title Patient will increase FOTO score to equal to or greater than 70%    to demonstrate statistically significant improvement in mobility and quality of life.   ? Baseline 8/9: 64%; risk adjusted 34%; 03/21/21 FOTO: 45; 04/24/21 FOTO: 49% ; 08/22/2021= 56%   ? Time 12   ? Period Weeks   ? Status Partially Met   ? Target Date 10/11/21   ?   ? PT LONG TERM GOAL #2  ? Title Patient will increase Berg Balance score by > 6 points to demonstrate decreased fall risk during functional activities.   ? Baseline 8/9: BBT 29/56; 03/21/21: 42/56   ? Time 12   ? Period Weeks   ? Status Achieved   ?  ? PT LONG TERM GOAL #3  ? Title Patient will increase 10 meter  walk test to >1.50ms as to improve gait speed for better community ambulation and to reduce fall risk.   ? Baseline 8/9: 0.65 m/s with rollator; 10/31: 0.68 m/s with rollator. 07/06/2021= 0.71 m/s using rollator; 08/22/2021= 0.94 m/s using rollator.   ? Time 12   ? Period Weeks   ? Status On-going   ? Target Date 10/11/21   ?  ? PT LONG TERM GOAL #4  ? Title Patient will increase six minute walk test distance to >1000 for progression to community ambulator and improve gait ability   ? Baseline 8/9: 505 ft with rollator; 03/21/21: [id number] rollator, 04/24/21: [id number] rollator; 07/06/2021=900 feet; 08/22/2021= 935 feet with use of Rollator   ? Time 12   ? Period Weeks   ? Status On-going   ? Target Date 10/11/21   ?  ? PT LONG TERM GOAL #5  ? Title Patient will increase glute medius strength on Left LE from 3-/5 to 4/5 to improve stability, gait mechanics, and functional strength.   ? Baseline 10/31: 3-/5; 07/06/2021= 3-/5; 08/22/2021=3-/5 unable to achieve full ROM yet able to withstand some resistance with testing   ? Time 12   ? Period Weeks   ? Status On-going   ? Target Date 10/11/21   ?  ? PT LONG TERM GOAL #6  ? Title Patient will increase Left single leg stance time to 15 seconds or greater to increase safety in shower and independence with ADLs.   ? Baseline 10/31; 1.83 sec without UE support; 08/22/2021=2-3  sec  on left; 6 sec on right   ? Time 12   ? Period Weeks   ? Status On-going   ? Target Date 10/11/21   ? ?  ?  ? ?  ? ? ? ? ? ? ? ? Plan - 09/25/21 1631   ? ? Clinical Impression Statement Patient tolerated interventions well this session. Occasional hyperextension of L knee noted but patient is able to  self correct. SUE support marches are very challenging. Patient is highly motivated throughout physical therapy session. Patient will continue to benefit from skilled physical therapy intervention in o

## 2021-09-25 NOTE — Therapy (Signed)
[place] [facility] [facility] ?[place], [place], [address] ?Phone: [phone]   Fax:  [phone] ? ?Occupational Therapy Treatment ? ?Patient Details  ?Name: Dustin Gray ?MRN: [id number] ?Date of Birth: 06/25/1956 ?Referring Provider (OT): Staci Acosta, MD ? ? ?Encounter Date: 09/25/2021 ? ? OT End of Session - 09/25/21 1513   ? ? Visit Number 18   ? Number of Visits 36   ? Date for OT Re-Evaluation 08/23/21   ? OT Start Time [phone]   ? OT Stop Time 0408   ? OT Time Calculation (min) 63 min   ? Activity Tolerance Patient tolerated treatment well;No increased pain   ? Behavior During Therapy [facility] for tasks assessed/performed   ? ?  ?  ? ?  ? ? ?Past Medical History:  ?Diagnosis Date  ? Abscess   ? groin  ? Arthritis   ? lower spine  ? Erectile dysfunction   ? Low testosterone   ? Lumbar herniated disc   ? L5  ? Multiple sclerosis ([place])   ? Staph aureus infection   ? ? ?Past Surgical History:  ?Procedure Laterality Date  ? COLONOSCOPY WITH PROPOFOL N/A 07/04/2016  ? Procedure: COLONOSCOPY WITH PROPOFOL;  Surgeon: [name], MD;  Location: [facility];  Service: Endoscopy;  Laterality: N/A;  ? [place]  ? MASS EXCISION  1960  ? POLYPECTOMY  07/04/2016  ? Procedure: POLYPECTOMY;  Surgeon: [name], MD;  Location: [facility];  Service: Endoscopy;;  ? TONSILLECTOMY    ? ? ?There were no vitals filed for this visit. ? ? Subjective Assessment - 09/25/21 1514   ? ? Subjective  Dustin Gray presents for OT visit 18/36 to address BLE lymphedema. Pt denies LE related leg pain. Pt  presents with custom compression stocking in place. Pt reports he is donning independently without difficulty.   ? Pertinent History relevant to LE: HTN, MS, Herniated C-5-C6, L5-S1, chronic low back pain, persistent Afib,Hx LLE cellulitis, Graves disease, HYPERthyroidism, OA   ? Limitations difficulty walking, impaired functional mobility and transfers,  impaired balance, muscle weakness, L>R,, altered sensation, chronic back pain , chronic leg swelling and associated pain, spasticity in legs   ? Repetition Increases Symptoms   ? Special Tests +Stemmer sign, L>R; Intake FOTO: 51/100   ? Patient Stated Goals Learn about lymphedema and explore treatment options   ? Pain Onset Yesterday   ? Pain Onset More than a month ago   ? ?  ?  ? ?  ? ? ? ? ? ? ? ? ? ? ? ? ? ? ? OT Treatments/Exercises (OP) - 09/25/21 1614   ? ?  ? ADLs  ? ADL Education Given Yes   ?  ? Manual Therapy  ? Manual Therapy Edema management;Compression Bandaging;Manual Lymphatic Drainage (MLD)   ? Edema Management anatomical measurements for RLE custom compression stocking   ? Manual Lymphatic Drainage (MLD) MLD to LLE/LLQ utilizing short neck sequence, diaphragmatic breathing, functional inguinal LN and proximal nto distal J strokes to each lower extremity segment. Fially repeat 3 retrograde sweeps to terminus and repeat short neck.   ? Compression Bandaging 4 layer knee length compression wrap , including foot , from base of toes to popliteal fossa using stockinett as base layer, then single layer Rosidal foam under 1 each 8,10 and 12 cm wide short stretch bandages.   ? ?  ?  ? ?  ? ? ? ? ? ? ? ? ?  OT Education - 09/25/21 1615   ? ? Education Details Continued Pt/ CG edu for lymphedema self care  and home program throughout session. Topics include multilayer, gradient compression wrapping, simple self-MLD, therapeutic lymphatic pumping exercises, skin/nail care, risk reduction factors and LE precautions, compression garments/recommendations and wear and care schedule and compression garment donning / doffing using assistive devices. All questions answered to the Pt's satisfaction, and Pt demonstrates understanding by report.   ? Person(s) Educated Patient   ? Methods Explanation;Demonstration;Handout   ? Comprehension Verbalized understanding;Returned demonstration;Need further instruction   ? ?  ?   ? ?  ? ? ? ? ? ? OT Long Term Goals - 08/10/21 1319   ? ?  ? OT LONG TERM GOAL #1  ? Title Given this patient?s Intake score of 51/100 on the functional outcomes FOTO tool, patient will experience an increase in function of 5 points to improve basic and instrumental ADLs performance, including lymphedema self-care.   ? Baseline Max A   ? Time 12   ? Period Weeks   ? Status Partially Met   08/08/21 ( OT visit 9) increased 2 points tfrom 51/100 initially to 53100 today.  ?  ? OT LONG TERM GOAL #2  ? Title Pt will be able to apply knee length, multi-layer, short stretch compression wraps to one limb at a time using gradient techniques with MAX CG ASSISTANCE to decrease limb volume, to limit infection risk, and to limit lymphedema progression.   ? Baseline Dependent   ? Time 4   ? Period Days   ? Status Achieved   Pt met and exceeded this goal as he is able to wrap independently  ?  ? OT LONG TERM GOAL #3  ? Title Pt will demonstrate understanding of lymphedema prevention strategies by identifying and discussing 5 precautions using printed reference (modified assistance) to reduce risk of progression and to limit infection risk.   ? Baseline Max A   ? Time 4   ? Period Days   ? Status Achieved   ? Target Date --   4th OT Rx visit  ?  ? OT LONG TERM GOAL #4  ? Title Pt will achieve at least a 10% limb volume reduction in bilateral legs to return limb to normal size and shape,  to limit lymphedema progression and to limit infection risk.   ? Baseline mAx   ? Time 12   ? Period Weeks   ? Status Partially Met   LLE reduced by 18.8 % in 10 visits.  ? Target Date 08/23/21   ?  ? OT LONG TERM GOAL #5  ? Title With MAX CG ASSISTANCE Pt will achieve and sustain a least 85% compliance with all 4 LE self-care home program components throughout Intensive Phase CDT, including modified simple self-MLD, daily skin inspection and care, lymphatic pumping the ex, 23/7 compression wraps to sustain clinical gains made in CDT and to limit  lymphedema progression and further functional decline.   ? Baseline Dependent   ? Time 12   ? Period Weeks   ? Status Achieved   Met and exceeded goal. Pt modified independent (extra time) with all home program components and consistently 85% compliant w LE self care  ? Target Date 08/23/21   ? ?  ?  ? ?  ? ? ? ? ? ? ? ? Plan - 09/25/21 1613   ? ? Clinical Impression Statement Completed RLE anatomicaL MEASUREMENTS   FOR COMPRESSION STOCKING.  Pt tolerated  RLE/RLQ MLD wo increased pain. Limb volume in RLE is significantly less than it was when we commenced CDT to the LLE.  Applied gradient compression to RLE as established. Good session.   ? OT Occupational Profile and History Comprehensive Assessment- Review of records and extensive additional review of physical, cognitive, psychosocial history related to current functional performance   ? Occupational performance deficits (Please refer to evaluation for details): ADL's;IADL's;Work;Leisure;Social Participation;Other   role performance  ? Body Structure / Function / Physical Skills ADL;Edema;Skin integrity;Flexibility;Pain;ROM;Decreased knowledge of use of DME;Scar mobility;IADL   ? Rehab Potential Good   ? Clinical Decision Making Several treatment options, min-mod task modification necessary   ? Comorbidities Affecting Occupational Performance: Presence of comorbidities impacting occupational performance   ? Modification or Assistance to Complete Evaluation  Min-Moderate modification of tasks or assist with assess necessary to complete eval   ? OT Frequency 2x / week   ? OT Duration 12 weeks   and PRN for follow along  ? OT Treatment/Interventions Self-care/ADL training;Therapeutic exercise;Manual Therapy;Coping strategies training;Therapeutic activities;Manual lymph drainage;DME and/or AE instruction;Compression bandaging;Other (comment);Patient/family education   skin care to limit infection risk  ? Plan Complete Decongestive Therapy (CDT) One leg at a time to  limit fall LLE first. Manual lymphatic drainage (MLD), skin care, ther ex, compression wraps, then   ? OT Home Exercise Plan Pt verbalized understanding that he requires assistance with all lymphedema home

## 2021-09-26 ENCOUNTER — Telehealth: Payer: Self-pay | Admitting: Cardiology

## 2021-09-26 NOTE — Telephone Encounter (Signed)
Patient states he does not have a ride for procedure tomorrow and would like to reschedule it for 4/26. ?

## 2021-09-27 ENCOUNTER — Encounter: Admission: RE | Payer: Self-pay | Source: Home / Self Care

## 2021-09-27 ENCOUNTER — Ambulatory Visit: Admission: RE | Admit: 2021-09-27 | Payer: Medicare PPO | Source: Home / Self Care | Admitting: Cardiology

## 2021-09-27 ENCOUNTER — Ambulatory Visit: Payer: Medicare PPO | Admitting: Physical Therapy

## 2021-09-27 ENCOUNTER — Encounter: Payer: Medicare PPO | Admitting: Occupational Therapy

## 2021-09-27 ENCOUNTER — Encounter: Payer: Self-pay | Admitting: Anesthesiology

## 2021-09-27 SURGERY — CARDIOVERSION
Anesthesia: General

## 2021-09-27 MED ORDER — SODIUM CHLORIDE 0.9 % IV SOLN: INTRAVENOUS | Status: DC

## 2021-09-27 MED ORDER — PROPOFOL 500 MG/50ML IV EMUL
INTRAVENOUS | Status: AC
  Filled 2021-09-27: qty 50

## 2021-09-27 NOTE — Anesthesia Preprocedure Evaluation (Deleted)
Anesthesia Evaluation  ? ? ?Airway ? ? ? ? ? ? ? Dental ?  ?Pulmonary ?former smoker,  ?  ? ? ? ? ? ? ? Cardiovascular ?hypertension, + dysrhythmias (a fib on Eliquis)  ? ?ECG 09/04/21: A fib, LAD ?  ?Neuro/Psych ? Neuromuscular disease (multiple sclerosis)   ? GI/Hepatic ?  ?Endo/Other  ?Hyperthyroidism Obesity  ? Renal/GU ?  ? ?  ?Musculoskeletal ? ?(+) Arthritis ,  ? Abdominal ?  ?Peds ? Hematology ?  ?Anesthesia Other Findings ?Cardiology note 09/04/21:  ?1. Persistent atrial fibrillation.  EKG today shows atrial fibrillation, heart rate 83 .  Thyroid function appears adequately controlled.  Recent TSH, T3 appears controlled.  T4 low..  Continue Cardizem SR 120 twice daily, continue Inderal, continue Eliquis 5 mg twice daily.   CHA2DS2-VASc score 0.  Plan DC cardioversion  if patient still in atrial fibrillation.  Repeat echo as scheduled. ?2. hyperthyroidism.  On methimazole. management as per endocrinology. ?? ?Follow-up in 6 weeks ? Reproductive/Obstetrics ? ?  ? ? ? ? ? ? ? ? ? ? ? ? ? ?  ?  ? ? ? ? ? ? ? ?Anesthesia Physical ?Anesthesia Plan ? ?ASA: 3 ? ?Anesthesia Plan: General  ? ?Post-op Pain Management:   ? ?Induction: Intravenous ? ?PONV Risk Score and Plan: 2 and Propofol infusion, TIVA and Treatment may vary due to age or medical condition ? ?Airway Management Planned: Natural Airway ? ?Additional Equipment:  ? ?Intra-op Plan:  ? ?Post-operative Plan:  ? ?Informed Consent: I have reviewed the patients History and Physical, chart, labs and discussed the procedure including the risks, benefits and alternatives for the proposed anesthesia with the patient or authorized representative who has indicated his/her understanding and acceptance.  ? ? ? ? ? ?Plan Discussed with: CRNA ? ?Anesthesia Plan Comments: (Anesthetic considerations for multiple sclerosis: closely monitor body temperature to avoid increase above baseline, avoid succinylcholine, pt may have altered  sensitivity to NDMRs. ? ?LMA/GETA backup discussed.  Patient consented for risks of anesthesia including but not limited to:  ?- adverse reactions to medications ?- damage to eyes, teeth, lips or other oral mucosa ?- nerve damage due to positioning  ?- sore throat or hoarseness ?- damage to heart, brain, nerves, lungs, other parts of body or loss of life ? ?Informed patient about role of CRNA in peri- and intra-operative care.  Patient voiced understanding.)  ? ? ? ? ? ? ?Anesthesia Quick Evaluation ? ?

## 2021-10-03 ENCOUNTER — Ambulatory Visit: Payer: Medicare PPO

## 2021-10-03 DIAGNOSIS — I890 Lymphedema, not elsewhere classified: Secondary | ICD-10-CM | POA: Diagnosis not present

## 2021-10-03 DIAGNOSIS — R2681 Unsteadiness on feet: Secondary | ICD-10-CM | POA: Diagnosis not present

## 2021-10-03 DIAGNOSIS — R269 Unspecified abnormalities of gait and mobility: Secondary | ICD-10-CM | POA: Diagnosis not present

## 2021-10-03 DIAGNOSIS — R262 Difficulty in walking, not elsewhere classified: Secondary | ICD-10-CM | POA: Diagnosis not present

## 2021-10-03 DIAGNOSIS — R2689 Other abnormalities of gait and mobility: Secondary | ICD-10-CM | POA: Diagnosis not present

## 2021-10-03 DIAGNOSIS — R278 Other lack of coordination: Secondary | ICD-10-CM | POA: Diagnosis not present

## 2021-10-03 DIAGNOSIS — M6281 Muscle weakness (generalized): Secondary | ICD-10-CM

## 2021-10-03 NOTE — Therapy (Signed)
[place] [facility] ?[place], [place], [address] ?Phone: [phone]   Fax:  [phone] ? ?Physical Therapy Treatment ? ?Patient Details  ?Name: Dustin Gray ?MRN: [id number] ?Date of Birth: Sep 06, 1955 ?No data recorded ? ?Encounter Date: 10/03/2021 ? ? PT End of Session - 10/03/21 1353   ? ? Visit Number 25   ? Number of Visits 57   ? Date for PT Re-Evaluation 10/11/21   ? Authorization Type [employer]   ? Authorization Time Period 01/31/21-04/25/21   ? PT Start Time 1347   ? PT Stop Time 6962   ? PT Time Calculation (min) 42 min   ? Equipment Utilized During Treatment Gait belt   ? Activity Tolerance Patient tolerated treatment well;No increased pain;Patient limited by fatigue   ? Behavior During Therapy [facility] for tasks assessed/performed   ? ?  ?  ? ?  ? ? ?Past Medical History:  ?Diagnosis Date  ? Abscess   ? groin  ? Arthritis   ? lower spine  ? Erectile dysfunction   ? Low testosterone   ? Lumbar herniated disc   ? L5  ? Multiple sclerosis ([place])   ? Staph aureus infection   ? ? ?Past Surgical History:  ?Procedure Laterality Date  ? COLONOSCOPY WITH PROPOFOL N/A 07/04/2016  ? Procedure: COLONOSCOPY WITH PROPOFOL;  Surgeon: [name], MD;  Location: [place];  Service: Endoscopy;  Laterality: N/A;  ? [place]  ? MASS EXCISION  1960  ? POLYPECTOMY  07/04/2016  ? Procedure: POLYPECTOMY;  Surgeon: [name], MD;  Location: [place];  Service: Endoscopy;;  ? TONSILLECTOMY    ? ? ?There were no vitals filed for this visit. ? ? Subjective Assessment - 10/03/21 1352   ? ? Subjective Patient reports he went to the beach over the weekend to see his mom. Had to cancel his cardioversion due to not having a ride, rescheduled for end of the month.   ? Pertinent History Patient presents to physical therapy for unsteadiness, walking instability, fatigue. His PMH includes paroxysmal a fib, MS, lymphedema, hypothyroidism, Graves  Disease, arthritis, lumbar herniated disc (L5), depression and neuropathy. Patient first developed symptoms of MS in 2005 and was diagnosed in 2006. Patient has done PT in the past, but hasn't been seen since 2020 at [facility]. Has not been doing his exercises in the past year. Walk outside to garage to ride lawnmower, walks in house. 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 Limitations Standing;Lifting;Walking;House hold activities   ? How long can you sit comfortably? a couple hours   ? How long can you stand comfortably? no more than 10 minutes before pain, have to steady self immediately   ? How long can you walk comfortably? has to use rollator. can walk in a store with a cart   ? Patient Stated Goals want to get stronger and feel mor comfortable walking around. Patient would like to continue strengthening hips and improve endurance and ability to pace self for safety while walking.   ? Currently in Pain? No/denies   ? ?  ?  ? ?  ? ? ? ? ? ? ? ? ? ?INTERVENTIONS:  ?  ?By support bar: CGA and use of gait belt unless otherwise stated. ?  ?Neuro re-ed ?  ?Lateral stepping on balance beam 4x length of // bars with UE support ? ?  Tandem walking with BUE support on airex balance beam 4x length of // bars ? ?Airex balance beam: static stand and throw ball with SPT for pertubation's x3 minutes ? ?Step over orange hurdle and back 10x each LE; BUE support; very challenging for LLE ? ?  ?TherEx  ?Nustep Lvl 5 RPM> 60 for cardiovascular challenge x4 minutes   ? ?-Step ups 4"  green step with Min UE support on rails x 12 reps each leg ?  ?2lb ankle weights: ?-lateral stepping 6x length of // bars  ?-forward backwards walking 6x length of // bars ?-Sit to stand with blue airex pad under right LE x 10 reps without UE support. ?-heel raises 15x ? ?10x STS  ?  ?Pt educated throughout session about proper posture and technique with exercises. Improved exercise technique, movement at  target joints, use of target muscles after min to mod verbal, visual, tactile cues. ? ? ? ?Patient is challenged with LLE muscle activation and foot clearance compared to RLE. He is highly motivated throughout session for progression of strength and mobility. Utilization of ankle weights tolerated well with fatigue. Patient will continue to benefit from skilled physical therapy intervention in order to improve his strength, ambulatory capacity, balance, and increase his overall function and safety with everyday ambulation ? ? ? ? ? ? ? ? ? ? ? ? ? ? ? PT Education - 10/03/21 1353   ? ? Education Details exercise technique, body mechanics   ? Person(s) Educated Patient   ? Methods Explanation;Demonstration;Tactile cues;Verbal cues   ? Comprehension Verbalized understanding;Returned demonstration;Verbal cues required;Tactile cues required   ? ?  ?  ? ?  ? ? ? PT Short Term Goals - 08/22/21 1358   ? ?  ? PT SHORT TERM GOAL #1  ? Title Patient will be independent in home exercise program to improve strength/mobility for better functional independence with ADLs.   ? Baseline 8/9: HEP to be given next session, 10/31: patient reports compliance with HEP and would like progressions added next session. 08/22/2021=Patient verbalized that he is walking and doing his LE exercises with no questions at this time.   ? Time 4   ? Period Weeks   ? Status Achieved   ? Target Date 08/16/21   ? ?  ?  ? ?  ? ? ? ? PT Long Term Goals - 08/22/21 1355   ? ?  ? PT LONG TERM GOAL #1  ? Title Patient will increase FOTO score to equal to or greater than 70%    to demonstrate statistically significant improvement in mobility and quality of life.   ? Baseline 8/9: 64%; risk adjusted 34%; 03/21/21 FOTO: 45; 04/24/21 FOTO: 49% ; 08/22/2021= 56%   ? Time 12   ? Period Weeks   ? Status Partially Met   ? Target Date 10/11/21   ?  ? PT LONG TERM GOAL #2  ? Title Patient will increase Berg Balance score by > 6 points to demonstrate decreased fall risk  during functional activities.   ? Baseline 8/9: BBT 29/56; 03/21/21: 42/56   ? Time 12   ? Period Weeks   ? Status Achieved   ?  ? PT LONG TERM GOAL #3  ? Title Patient will increase 10 meter walk test to >1.39ms as to improve gait speed for better community ambulation and to reduce fall risk.   ? Baseline 8/9: 0.65 m/s with rollator; 10/31: 0.68 m/s with rollator. 07/06/2021= 0.71 m/s using  rollator; 08/22/2021= 0.94 m/s using rollator.   ? Time 12   ? Period Weeks   ? Status On-going   ? Target Date 10/11/21   ?  ? PT LONG TERM GOAL #4  ? Title Patient will increase six minute walk test distance to >1000 for progression to community ambulator and improve gait ability   ? Baseline 8/9: 505 ft with rollator; 03/21/21: 770ft c rollator, 04/24/21: 755ft c rollator; 07/06/2021=900 feet; 08/22/2021= 935 feet with use of Rollator   ? Time 12   ? Period Weeks   ? Status On-going   ? Target Date 10/11/21   ?  ? PT LONG TERM GOAL #5  ? Title Patient will increase glute medius strength on Left LE from 3-/5 to 4/5 to improve stability, gait mechanics, and functional strength.   ? Baseline 10/31: 3-/5; 07/06/2021= 3-/5; 08/22/2021=3-/5 unable to achieve full ROM yet able to withstand some resistance with testing   ? Time 12   ? Period Weeks   ? Status On-going   ? Target Date 10/11/21   ?  ? PT LONG TERM GOAL #6  ? Title Patient will increase Left single leg stance time to 15 seconds or greater to increase safety in shower and independence with ADLs.   ? Baseline 10/31; 1.83 sec without UE support; 08/22/2021=2-3  sec  on left; 6 sec on right   ? Time 12   ? Period Weeks   ? Status On-going   ? Target Date 10/11/21   ? ?  ?  ? ?  ? ? ? ? ? ? ? ? Plan - 10/03/21 1414   ? ? Clinical Impression Statement Patient is challenged with LLE muscle activation and foot clearance compared to RLE. He is highly motivated throughout session for progression of strength and mobility. Utilization of ankle weights tolerated well with fatigue. Patient  will continue to benefit from skilled physical therapy intervention in order to improve his strength, ambulatory capacity, balance, and increase his overall function and safety with everyday ambulation   ?

## 2021-10-05 ENCOUNTER — Ambulatory Visit: Payer: Medicare PPO | Admitting: Occupational Therapy

## 2021-10-05 DIAGNOSIS — M6281 Muscle weakness (generalized): Secondary | ICD-10-CM | POA: Diagnosis not present

## 2021-10-05 DIAGNOSIS — I890 Lymphedema, not elsewhere classified: Secondary | ICD-10-CM

## 2021-10-05 DIAGNOSIS — R269 Unspecified abnormalities of gait and mobility: Secondary | ICD-10-CM | POA: Diagnosis not present

## 2021-10-05 DIAGNOSIS — R2689 Other abnormalities of gait and mobility: Secondary | ICD-10-CM | POA: Diagnosis not present

## 2021-10-05 DIAGNOSIS — R262 Difficulty in walking, not elsewhere classified: Secondary | ICD-10-CM | POA: Diagnosis not present

## 2021-10-05 DIAGNOSIS — R278 Other lack of coordination: Secondary | ICD-10-CM | POA: Diagnosis not present

## 2021-10-05 DIAGNOSIS — R2681 Unsteadiness on feet: Secondary | ICD-10-CM | POA: Diagnosis not present

## 2021-10-05 NOTE — Therapy (Signed)
[place] [facility] ?[place], [place], [address] ?Phone: [phone]   Fax:  [phone] ? ?Occupational Therapy Treatment ? ?Patient Details  ?Name: Dustin Gray ?MRN: [id number] ?Date of Birth: April 03, 1956 ?Referring Provider (OT): Staci Acosta, MD ? ? ?Encounter Date: 10/05/2021 ? ? OT End of Session - 10/05/21 1334   ? ? Visit Number 19   ? Number of Visits 36   ? Date for OT Re-Evaluation 01/03/22   ? OT Start Time 1100   ? OT Stop Time 1205   ? OT Time Calculation (min) 65 min   ? Activity Tolerance Patient tolerated treatment well;No increased pain   ? Behavior During Therapy [facility] for tasks assessed/performed   ? ?  ?  ? ?  ? ? ?Past Medical History:  ?Diagnosis Date  ? Abscess   ? groin  ? Arthritis   ? lower spine  ? Erectile dysfunction   ? Low testosterone   ? Lumbar herniated disc   ? L5  ? Multiple sclerosis ([place])   ? Staph aureus infection   ? ? ?Past Surgical History:  ?Procedure Laterality Date  ? COLONOSCOPY WITH PROPOFOL N/A 07/04/2016  ? Procedure: COLONOSCOPY WITH PROPOFOL;  Surgeon: [name], MD;  Location: [place];  Service: Endoscopy;  Laterality: N/A;  ? [place]  ? MASS EXCISION  1960  ? POLYPECTOMY  07/04/2016  ? Procedure: POLYPECTOMY;  Surgeon: [name], MD;  Location: [place];  Service: Endoscopy;;  ? TONSILLECTOMY    ? ? ?There were no vitals filed for this visit. ? ? Subjective Assessment - 10/05/21 1339   ? ? Subjective  Dustin "Pat" Gray presents for OT visit 19/36 to address BLE lymphedema. Pt denies LE related leg pain. Pt  presents with custom compression stocking in place on the L and multilayer wraps on t the RLE . Pt has no new complaints. He reports LE self management is nott a problem for him  between visits with longer interval.   ? Pertinent History relevant to LE: HTN, MS, Herniated C-5-C6, L5-S1, chronic low back pain, persistent Afib,Hx LLE cellulitis, Graves  disease, HYPERthyroidism, OA   ? Limitations difficulty walking, impaired functional mobility and transfers, impaired balance, muscle weakness, L>R,, altered sensation, chronic back pain , chronic leg swelling and associated pain, spasticity in legs   ? Repetition Increases Symptoms   ? Special Tests +Stemmer sign, L>R; Intake FOTO: 51/100   ? Patient Stated Goals Learn about lymphedema and explore treatment options   ? Currently in Pain? No/denies   ? Pain Onset Yesterday   ? Pain Onset More than a month ago   ? ?  ?  ? ?  ? ? ? ? ? ? ? ? ? ? ? ? ? ? ? OT Treatments/Exercises (OP) - 10/05/21 1341   ? ?  ? ADLs  ? ADL Education Given Yes   ?  ? Manual Therapy  ? Manual Therapy Edema management;Compression Bandaging;Manual Lymphatic Drainage (MLD)   ? Manual Lymphatic Drainage (MLD) MLD to LLE/LLQ utilizing short neck sequence, diaphragmatic breathing, functional inguinal LN and proximal nto distal J strokes to each lower extremity segment. Fially repeat 3 retrograde sweeps to terminus and repeat short neck.   ? Compression Bandaging 4 layer knee length compression wrap , including foot , from base of toes to popliteal fossa using stockinett as base layer, then single layer Rosidal foam under 1 each  8,10 and 12 cm wide short stretch bandages.   ? ?  ?  ? ?  ? ? ? ? ? ? ? ? ? OT Education - 10/05/21 1341   ? ? Education Details Continued Pt/ CG edu for lymphedema self care  and home program throughout session. Topics include multilayer, gradient compression wrapping, simple self-MLD, therapeutic lymphatic pumping exercises, skin/nail care, risk reduction factors and LE precautions, compression garments/recommendations and wear and care schedule and compression garment donning / doffing using assistive devices. All questions answered to the Pt's satisfaction, and Pt demonstrates understanding by report.   ? Person(s) Educated Patient   ? Methods Explanation;Demonstration;Handout   ? Comprehension Verbalized  understanding;Returned demonstration;Need further instruction   ? ?  ?  ? ?  ? ? ? ? ? ? OT Long Term Goals - 08/10/21 1319   ? ?  ? OT LONG TERM GOAL #1  ? Title Given this patient?s Intake score of 51/100 on the functional outcomes FOTO tool, patient will experience an increase in function of 5 points to improve basic and instrumental ADLs performance, including lymphedema self-care.   ? Baseline Max A   ? Time 12   ? Period Weeks   ? Status Partially Met   08/08/21 ( OT visit 9) increased 2 points tfrom 51/100 initially to 53100 today.  ?  ? OT LONG TERM GOAL #2  ? Title Pt will be able to apply knee length, multi-layer, short stretch compression wraps to one limb at a time using gradient techniques with MAX CG ASSISTANCE to decrease limb volume, to limit infection risk, and to limit lymphedema progression.   ? Baseline Dependent   ? Time 4   ? Period Days   ? Status Achieved   Pt met and exceeded this goal as he is able to wrap independently  ?  ? OT LONG TERM GOAL #3  ? Title Pt will demonstrate understanding of lymphedema prevention strategies by identifying and discussing 5 precautions using printed reference (modified assistance) to reduce risk of progression and to limit infection risk.   ? Baseline Max A   ? Time 4   ? Period Days   ? Status Achieved   ? Target Date --   4th OT Rx visit  ?  ? OT LONG TERM GOAL #4  ? Title Pt will achieve at least a 10% limb volume reduction in bilateral legs to return limb to normal size and shape,  to limit lymphedema progression and to limit infection risk.   ? Baseline mAx   ? Time 12   ? Period Weeks   ? Status Partially Met   LLE reduced by 18.8 % in 10 visits.  ? Target Date 08/23/21   ?  ? OT LONG TERM GOAL #5  ? Title With MAX CG ASSISTANCE Pt will achieve and sustain a least 85% compliance with all 4 LE self-care home program components throughout Intensive Phase CDT, including modified simple self-MLD, daily skin inspection and care, lymphatic pumping the ex, 23/7  compression wraps to sustain clinical gains made in CDT and to limit lymphedema progression and further functional decline.   ? Baseline Dependent   ? Time 12   ? Period Weeks   ? Status Achieved   Met and exceeded goal. Pt modified independent (extra time) with all home program components and consistently 85% compliant w LE self care  ? Target Date 08/23/21   ? ?  ?  ? ?  ? ? ? ? ? ? ? ?  Plan - 10/05/21 1336   ? ? Clinical Impression Statement Documented faults with initial LLE custom garment and emailed photos to DME vendor for manufacturer after session. Emphasis of session on MLD and compression therapy to RLEas established. Pt is doing a very good job with LE self care betrween sessions. We are awaiting delivery of remake for LLE and for initial compression garment for the RLE, whichh we measured last visit. Pt tolerated all aspects of OT today without increased pain. Cont as per POC.   ? OT Occupational Profile and History Comprehensive Assessment- Review of records and extensive additional review of physical, cognitive, psychosocial history related to current functional performance   ? Occupational performance deficits (Please refer to evaluation for details): ADL's;IADL's;Work;Leisure;Social Participation;Other   role performance  ? Body Structure / Function / Physical Skills ADL;Edema;Skin integrity;Flexibility;Pain;ROM;Decreased knowledge of use of DME;Scar mobility;IADL   ? Rehab Potential Good   ? Clinical Decision Making Several treatment options, min-mod task modification necessary   ? Comorbidities Affecting Occupational Performance: Presence of comorbidities impacting occupational performance   ? Modification or Assistance to Complete Evaluation  Min-Moderate modification of tasks or assist with assess necessary to complete eval   ? OT Frequency 2x / week   ? OT Duration 12 weeks   and PRN for follow along  ? OT Treatment/Interventions Self-care/ADL training;Therapeutic exercise;Manual Therapy;Coping  strategies training;Therapeutic activities;Manual lymph drainage;DME and/or AE instruction;Compression bandaging;Other (comment);Patient/family education   skin care to limit infection risk  ? Plan Complete [place]

## 2021-10-05 NOTE — Patient Instructions (Signed)

## 2021-10-09 ENCOUNTER — Ambulatory Visit: Payer: Medicare PPO

## 2021-10-09 ENCOUNTER — Ambulatory Visit: Payer: Medicare PPO | Admitting: Occupational Therapy

## 2021-10-09 DIAGNOSIS — M6281 Muscle weakness (generalized): Secondary | ICD-10-CM

## 2021-10-09 DIAGNOSIS — R278 Other lack of coordination: Secondary | ICD-10-CM | POA: Diagnosis not present

## 2021-10-09 DIAGNOSIS — I890 Lymphedema, not elsewhere classified: Secondary | ICD-10-CM

## 2021-10-09 DIAGNOSIS — R262 Difficulty in walking, not elsewhere classified: Secondary | ICD-10-CM

## 2021-10-09 DIAGNOSIS — R269 Unspecified abnormalities of gait and mobility: Secondary | ICD-10-CM

## 2021-10-09 DIAGNOSIS — R2681 Unsteadiness on feet: Secondary | ICD-10-CM

## 2021-10-09 DIAGNOSIS — R2689 Other abnormalities of gait and mobility: Secondary | ICD-10-CM | POA: Diagnosis not present

## 2021-10-09 NOTE — Therapy (Signed)
[facility] ?[place], [place], [address] ?Phone: [phone]   Fax:  [phone] ? ?Physical Therapy Treatment ? ?Patient Details  ?Name: Dustin Gray ?MRN: [id number] ?Date of Birth: 1955-09-30 ?No data recorded ? ?Encounter Date: 10/09/2021 ? ? PT End of Session - 10/09/21 1523   ? ? Visit Number 84   ? Number of Visits 57   ? Date for PT Re-Evaluation 10/11/21   ? [place] primary  Cert 01/17/35-6/44/03   ? Authorization Time Period 08/23/21-11/23/21 24 visit   ? Authorization - Visit Number 7   ? Authorization - Number of Visits 24   ? Progress Note Due on Visit 50   ? PT Start Time 1515   ? PT Stop Time 4742   ? PT Time Calculation (min) 40 min   ? Equipment Utilized During Treatment Gait belt   ? Activity Tolerance Patient tolerated treatment well;No increased pain;Patient limited by fatigue   ? Behavior During Therapy [facility] for tasks assessed/performed   ? ?  ?  ? ?  ? ? ?Past Medical History:  ?Diagnosis Date  ? Abscess   ? groin  ? Arthritis   ? lower spine  ? Erectile dysfunction   ? Low testosterone   ? Lumbar herniated disc   ? L5  ? Multiple sclerosis ([place])   ? Staph aureus infection   ? ? ?Past Surgical History:  ?Procedure Laterality Date  ? COLONOSCOPY WITH PROPOFOL N/A 07/04/2016  ? Procedure: COLONOSCOPY WITH PROPOFOL;  Surgeon: [name], MD;  Location: [facility];  Service: Endoscopy;  Laterality: N/A;  ? [place]  ? MASS EXCISION  1960  ? POLYPECTOMY  07/04/2016  ? Procedure: POLYPECTOMY;  Surgeon: [name], MD;  Location: [facility];  Service: Endoscopy;;  ? TONSILLECTOMY    ? ? ?There were no vitals filed for this visit. ? ? Subjective Assessment - 10/09/21 1522   ? ? Subjective Pt doing well today. No medical or medication updates since past visit. No falls recently.   ? Pertinent History Patient presents to physical therapy for unsteadiness, walking instability,  fatigue. His PMH includes paroxysmal a fib, MS, lymphedema, hypothyroidism, Graves Disease, arthritis, lumbar herniated disc (L5), depression and neuropathy. Patient first developed symptoms of MS in 2005 and was diagnosed in 2006. Patient has done PT in the past, but hasn't been seen since [facility] at [facility]. Has not been doing his exercises in the past year. Walk outside to garage to ride lawnmower, walks in house. 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 Currently in Pain? No/denies   ? ?  ?  ? ?  ? ? ? ?INTERVENTIONS this date:  ?-Nustep seat 12, arms 13, level 4 x4 minutes, level 5 x 2 minutes ?-lateral step over hurdle 1x8 bilat ?-fwd step over 1x8 bilat  ? ?-STS 1x10 chair + airex (hands free)  ? ?2lb ankle weights: ?-lateral stepping 4x length of // bars  ?-forward backwards walking 4x length of // bars ?-heel raises 15x ? ? PT Education - 10/09/21 1528   ? ? Education Details using both legs differently in exercises baesd on weakness areas.   ? Person(s) Educated Patient   ? Methods Explanation;Demonstration   ? Comprehension Verbalized understanding   ? ?  ?  ? ?  ? ? ? PT Short Term Goals -  08/22/21 1358   ? ?  ? PT SHORT TERM GOAL #1  ? Title Patient will be independent in home exercise program to improve strength/mobility for better functional independence with ADLs.   ? Baseline 8/9: HEP to be given next session, 10/31: patient reports compliance with HEP and would like progressions added next session. 08/22/2021=Patient verbalized that he is walking and doing his LE exercises with no questions at this time.   ? Time 4   ? Period Weeks   ? Status Achieved   ? Target Date 08/16/21   ? ?  ?  ? ?  ? ? ? ? PT Long Term Goals - 08/22/21 1355   ? ?  ? PT LONG TERM GOAL #1  ? Title Patient will increase FOTO score to equal to or greater than 70%    to demonstrate statistically significant improvement in mobility and quality of life.   ? Baseline 8/9: 64%; risk  adjusted 34%; 03/21/21 FOTO: 45; 04/24/21 FOTO: 49% ; 08/22/2021= 56%   ? Time 12   ? Period Weeks   ? Status Partially Met   ? Target Date 10/11/21   ?  ? PT LONG TERM GOAL #2  ? Title Patient will increase Berg Balance score by > 6 points to demonstrate decreased fall risk during functional activities.   ? Baseline 8/9: BBT 29/56; 03/21/21: 42/56   ? Time 12   ? Period Weeks   ? Status Achieved   ?  ? PT LONG TERM GOAL #3  ? Title Patient will increase 10 meter walk test to >1.53ms as to improve gait speed for better community ambulation and to reduce fall risk.   ? Baseline 8/9: 0.65 m/s with rollator; 10/31: 0.68 m/s with rollator. 07/06/2021= 0.71 m/s using rollator; 08/22/2021= 0.94 m/s using rollator.   ? Time 12   ? Period Weeks   ? Status On-going   ? Target Date 10/11/21   ?  ? PT LONG TERM GOAL #4  ? Title Patient will increase six minute walk test distance to >1000 for progression to community ambulator and improve gait ability   ? Baseline 8/9: 505 ft with rollator; 03/21/21: [id number] rollator, 04/24/21: [id number] rollator; 07/06/2021=900 feet; 08/22/2021= 935 feet with use of Rollator   ? Time 12   ? Period Weeks   ? Status On-going   ? Target Date 10/11/21   ?  ? PT LONG TERM GOAL #5  ? Title Patient will increase glute medius strength on Left LE from 3-/5 to 4/5 to improve stability, gait mechanics, and functional strength.   ? Baseline 10/31: 3-/5; 07/06/2021= 3-/5; 08/22/2021=3-/5 unable to achieve full ROM yet able to withstand some resistance with testing   ? Time 12   ? Period Weeks   ? Status On-going   ? Target Date 10/11/21   ?  ? PT LONG TERM GOAL #6  ? Title Patient will increase Left single leg stance time to 15 seconds or greater to increase safety in shower and independence with ADLs.   ? Baseline 10/31; 1.83 sec without UE support; 08/22/2021=2-3  sec  on left; 6 sec on right   ? Time 12   ? Period Weeks   ? Status On-going   ? Target Date 10/11/21   ? ?  ?  ? ?  ? ? ? ? ? ? ? ? Plan - 10/09/21 1538    ? ? Clinical Impression Statement Conitnued with global strenghtening and  balance control. Pt given adequate rest breaks between interventions to avoid complete fatigue of muscle groups and maximize performance. Pt able to perform most activity at supervision level c // bars in place.   ? Personal Factors and Comorbidities Age;Comorbidity 3+;Fitness;Past/Current Experience;Time since onset of injury/illness/exacerbation   ? Comorbidities aroxysmal a fib, MS, lymphedema, hypothyroidism, Graves Disease, arthritis, lumbar herniated disc (L5), depression and neuropathy   ? Examination-Activity Limitations Bed Mobility;Bend;Caring for Others;Carry;Reach Overhead;Locomotion Level;Lift;Hygiene/Grooming;Dressing;Squat;Stairs;Stand;Toileting;Transfers   ? Examination-Participation Restrictions Cleaning;Community Activity;Laundry;Occupation;Shop;Volunteer;[place] [employer] Work   ? Stability/Clinical Decision Making Evolving/Moderate complexity   ? Clinical Decision Making Moderate   ? Rehab Potential Fair   ? PT Frequency 2x / week   ? PT Duration 12 weeks   ? PT Treatment/Interventions ADLs/Self Care Home Management;Aquatic Therapy;Canalith Repostioning;Biofeedback;Cryotherapy;Ultrasound;Traction;Moist Heat;Electrical Stimulation;DME Instruction;Gait training;Therapeutic exercise;Therapeutic activities;Functional mobility training;Stair training;Balance training;Neuromuscular re-education;Patient/family education;Manual techniques;Orthotic Fit/Training;Compression bandaging;Passive range of motion;Vestibular;Taping;Splinting;Energy conservation;Dry needling;Visual/perceptual remediation/compensation   ? PT Next Visit Plan Continue with progessive balance training, Progressive LE strenthening as appropriate.   ? PT Home Exercise Plan Access Code: [email or web address] URL: [email or web address]  Added [place] with BTB and encouraged more walking around the home during commerical breaks when watching TV.   ? Consulted and Agree with  Plan of Care Patient   ? ?  ?  ? ?  ? ? ?Patient will benefit from skilled therapeutic intervention in order to improve the following deficits and impairments:  Abnormal gait, Cardiopulmonary status limitin

## 2021-10-09 NOTE — Therapy (Signed)
[facility] ?[place], [place], [address] ?Phone: [phone]   Fax:  [phone] ? ?Occupational Therapy Treatment Note and Progress Report: ?Lymphedema Care ? ?Patient Details  ?Name: Dustin Gray ?MRN: [id number] ?Date of Birth: 1955-07-20 ?Referring Provider (OT): Staci Acosta, MD ? ? ?Encounter Date: 10/09/2021 ? ? OT End of Session - 10/09/21 1607   ? ? Visit Number 20   ? Number of Visits 36   ? Date for OT Re-Evaluation 01/03/22   ? OT Start Time 0200   ? OT Stop Time 0300   ? OT Time Calculation (min) 60 min   ? Activity Tolerance Patient tolerated treatment well;No increased pain   ? Behavior During Therapy [facility] for tasks assessed/performed   ? ?  ?  ? ?  ? ? ?Past Medical History:  ?Diagnosis Date  ? Abscess   ? groin  ? Arthritis   ? lower spine  ? Erectile dysfunction   ? Low testosterone   ? Lumbar herniated disc   ? L5  ? Multiple sclerosis ([place])   ? Staph aureus infection   ? ? ?Past Surgical History:  ?Procedure Laterality Date  ? COLONOSCOPY WITH PROPOFOL N/A 07/04/2016  ? Procedure: COLONOSCOPY WITH PROPOFOL;  Surgeon: [name], MD;  Location: [facility];  Service: Endoscopy;  Laterality: N/A;  ? [place]  ? MASS EXCISION  1960  ? POLYPECTOMY  07/04/2016  ? Procedure: POLYPECTOMY;  Surgeon: [name], MD;  Location: [facility];  Service: Endoscopy;;  ? TONSILLECTOMY    ? ? ?There were no vitals filed for this visit. ? ? Subjective Assessment - 10/09/21 1608   ? ? Subjective  Mr. Dustin Gray presents for OT visit 20/36 to address BLE lymphedema. Pt denies LE related leg pain. Pt  presents with custom compression stocking in place on the L and multilayer wraps on t the RLE . Pt has no new complaints. Pt denies LE-related leg pain today.   ? Pertinent History relevant to LE: HTN, MS, Herniated C-5-C6, L5-S1, chronic low back pain, persistent Afib,Hx LLE cellulitis, Graves disease, HYPERthyroidism,  OA   ? Limitations difficulty walking, impaired functional mobility and transfers, impaired balance, muscle weakness, L>R,, altered sensation, chronic back pain , chronic leg swelling and associated pain, spasticity in legs   ? Repetition Increases Symptoms   ? Special Tests +Stemmer sign, L>R; Intake FOTO: 51/100   ? Patient Stated Goals Learn about lymphedema and explore treatment options   ? Currently in Pain? No/denies   ? Pain Onset Yesterday   ? Pain Onset More than a month ago   ? ?  ?  ? ?  ? ? ? ? ? ? ? ? ? ? ? ? ? ? ? OT Treatments/Exercises (OP) - 10/09/21 1609   ? ?  ? ADLs  ? ADL Education Given Yes   ?  ? Manual Therapy  ? Manual Therapy Edema management;Compression Bandaging;Manual Lymphatic Drainage (MLD)   ? Manual Lymphatic Drainage (MLD) MLD to LLE/LLQ utilizing short neck sequence, diaphragmatic breathing, functional inguinal LN and proximal nto distal J strokes to each lower extremity segment. Fially repeat 3 retrograde sweeps to terminus and repeat short neck.   ? Compression Bandaging 4 layer knee length compression wrap , including foot , from base of toes to popliteal fossa using stockinett as base layer, then single layer Rosidal foam under 1 each 8,10 and 12 cm wide short  stretch bandages.   ? ?  ?  ? ?  ? ? ? ? ? ? ? ? ? OT Education - 10/09/21 1609   ? ? Education Details Continued Pt/ CG edu for lymphedema self care  and home program throughout session. Topics include multilayer, gradient compression wrapping, simple self-MLD, therapeutic lymphatic pumping exercises, skin/nail care, risk reduction factors and LE precautions, compression garments/recommendations and wear and care schedule and compression garment donning / doffing using assistive devices. All questions answered to the Pt's satisfaction, and Pt demonstrates understanding by report.   ? Person(s) Educated Patient   ? Methods Explanation;Demonstration;Handout   ? Comprehension Verbalized understanding;Returned  demonstration;Need further instruction   ? ?  ?  ? ?  ? ? ? ? ? ? OT Long Term Goals - 10/09/21 1610   ? ?  ? OT LONG TERM GOAL #1  ? Title Given this patient?s Intake score of 51/100 on the functional outcomes FOTO tool, patient will experience an increase in function of 5 points to improve basic and instrumental ADLs performance, including lymphedema self-care.   ? Baseline Max A   ? Time 12   ? Period Weeks   ? Status Partially Met   08/08/21 ( OT visit 9) increased 2 points tfrom 51/100 initially to 53100 today.  ? Target Date 01/07/22   ?  ? OT LONG TERM GOAL #2  ? Title Pt will be able to apply knee length, multi-layer, short stretch compression wraps to one limb at a time using gradient techniques with MAX CG ASSISTANCE to decrease limb volume, to limit infection risk, and to limit lymphedema progression.   ? Baseline Dependent   ? Time 4   ? Period Days   ? Status Achieved   Pt met and exceeded this goal as he is able to wrap independently  ?  ? OT LONG TERM GOAL #3  ? Title Pt will demonstrate understanding of lymphedema prevention strategies by identifying and discussing 5 precautions using printed reference (modified assistance) to reduce risk of progression and to limit infection risk.   ? Baseline Max A   ? Time 4   ? Period Days   ? Status Achieved   ? Target Date --   4th OT Rx visit  ?  ? OT LONG TERM GOAL #4  ? Title Pt will achieve at least a 10% limb volume reduction in bilateral legs to return limb to normal size and shape,  to limit lymphedema progression and to limit infection risk.   ? Baseline mAx   ? Time 12   ? Period Weeks   ? Status Partially Met   LLE reduced by 18.8 % in 10 visits. RLE TBA next bisit. By visual assessment appears to be reduced ~ 15% from initial.  ? Target Date 01/07/22   ?  ? OT LONG TERM GOAL #5  ? Title With MAX CG ASSISTANCE Pt will achieve and sustain a least 85% compliance with all 4 LE self-care home program components throughout Intensive Phase CDT, including  modified simple self-MLD, daily skin inspection and care, lymphatic pumping the ex, 23/7 compression wraps to sustain clinical gains made in CDT and to limit lymphedema progression and further functional decline.   ? Baseline Dependent   ? Time 12   ? Period Weeks   ? Status Achieved   Met and exceeded goal. Pt modified independent (extra time) with all home program components and consistently 85% compliant w LE self care  ? ?  ?  ? ?  ? ? ? ? ? ? ? ?  Plan - 10/09/21 1612   ? ? Clinical Impression Statement Please refer to LONG TERM GOALS sections for details re progress todate. Pt is managing LE very well between visits. Custom RLE garment has been ordered and we wil reduce frequency once fitting is complete. Pt tolerated RLE MLD, skin care and compression wraps as established today without increased pain.. Cont as per POC.   ? OT Occupational Profile and History Comprehensive Assessment- Review of records and extensive additional review of physical, cognitive, psychosocial history related to current functional performance   ? Occupational performance deficits (Please refer to evaluation for details): ADL's;IADL's;Work;Leisure;Social Participation;Other   role performance  ? Body Structure / Function / Physical Skills ADL;Edema;Skin integrity;Flexibility;Pain;ROM;Decreased knowledge of use of DME;Scar mobility;IADL   ? Rehab Potential Good   ? Clinical Decision Making Several treatment options, min-mod task modification necessary   ? Comorbidities Affecting Occupational Performance: Presence of comorbidities impacting occupational performance   ? Modification or Assistance to Complete Evaluation  Min-Moderate modification of tasks or assist with assess necessary to complete eval   ? OT Frequency 2x / week   ? OT Duration 12 weeks   and PRN for follow along  ? OT Treatment/Interventions Self-care/ADL training;Therapeutic exercise;Manual Therapy;Coping strategies training;Therapeutic activities;Manual lymph  drainage;DME and/or AE instruction;Compression bandaging;Other (comment);Patient/family education   skin care to limit infection risk  ? Plan Complete Decongestive Therapy (CDT) One leg at a time to limit fall LLE first. Manual

## 2021-10-09 NOTE — Patient Instructions (Signed)

## 2021-10-12 ENCOUNTER — Ambulatory Visit: Payer: Medicare PPO

## 2021-10-12 DIAGNOSIS — R2689 Other abnormalities of gait and mobility: Secondary | ICD-10-CM | POA: Diagnosis not present

## 2021-10-12 DIAGNOSIS — M6281 Muscle weakness (generalized): Secondary | ICD-10-CM

## 2021-10-12 DIAGNOSIS — R269 Unspecified abnormalities of gait and mobility: Secondary | ICD-10-CM

## 2021-10-12 DIAGNOSIS — R2681 Unsteadiness on feet: Secondary | ICD-10-CM | POA: Diagnosis not present

## 2021-10-12 DIAGNOSIS — R262 Difficulty in walking, not elsewhere classified: Secondary | ICD-10-CM

## 2021-10-12 DIAGNOSIS — R278 Other lack of coordination: Secondary | ICD-10-CM | POA: Diagnosis not present

## 2021-10-12 DIAGNOSIS — I890 Lymphedema, not elsewhere classified: Secondary | ICD-10-CM | POA: Diagnosis not present

## 2021-10-12 NOTE — Therapy (Signed)
[facility] ?[place], [place], [address] ?Phone: [phone]   Fax:  [phone] ? ?Physical Therapy Treatment/RECERT ? ?Patient Details  ?Name: Dustin Gray ?MRN: [id number] ?Date of Birth: [birth year] ?No data recorded ? ?Encounter Date: 10/12/2021 ? ? PT End of Session - 10/12/21 1351   ? ? Visit Number 48   ? Number of Visits 72   ? Date for PT Re-Evaluation 01/04/22   ? [place] primary  Cert 1/00/71-08/13/73   ? Authorization Time Period 08/23/21-11/23/21 24 visit   ? Authorization - Visit Number 8   ? Authorization - Number of Visits 24   ? Progress Note Due on Visit 50   ? PT Start Time 1346   ? PT Stop Time 8832   ? PT Time Calculation (min) 43 min   ? Equipment Utilized During Treatment Gait belt   ? Activity Tolerance Patient tolerated treatment well;No increased pain;Patient limited by fatigue   ? Behavior During Therapy [facility] for tasks assessed/performed   ? ?  ?  ? ?  ? ? ?Past Medical History:  ?Diagnosis Date  ? Abscess   ? groin  ? Arthritis   ? lower spine  ? Erectile dysfunction   ? Low testosterone   ? Lumbar herniated disc   ? L5  ? Multiple sclerosis ([place])   ? Staph aureus infection   ? ? ?Past Surgical History:  ?Procedure Laterality Date  ? COLONOSCOPY WITH PROPOFOL N/A 07/04/2016  ? Procedure: COLONOSCOPY WITH PROPOFOL;  Surgeon: [name], MD;  Location: [facility];  Service: Endoscopy;  Laterality: N/A;  ? [place]  ? MASS EXCISION  1960  ? POLYPECTOMY  07/04/2016  ? Procedure: POLYPECTOMY;  Surgeon: [name], MD;  Location: [facility];  Service: Endoscopy;;  ? TONSILLECTOMY    ? ? ?There were no vitals filed for this visit. ? ? Subjective Assessment - 10/12/21 1350   ? ? Subjective Patient mowed his yard yesterday. Has some slight back pain.   ? Pertinent History Patient presents to physical therapy for unsteadiness, walking instability, fatigue. His PMH  includes paroxysmal a fib, MS, lymphedema, hypothyroidism, Graves Disease, arthritis, lumbar herniated disc (L5), depression and neuropathy. Patient first developed symptoms of MS in 2005 and was diagnosed in 2006. Patient has done PT in the past, but hasn't been seen since 2020 at [facility]. Has not been doing his exercises in the past year. Walk outside to garage to ride lawnmower, walks in house. 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 Limitations Standing;Lifting;Walking;House hold activities   ? How long can you sit comfortably? a couple hours   ? How long can you stand comfortably? no more than 10 minutes before pain, have to steady self immediately   ? How long can you walk comfortably? has to use rollator. can walk in a store with a cart   ? Patient Stated Goals want to get stronger and feel mor comfortable walking around. Patient would like to continue strengthening hips and improve endurance and ability to pace self for safety while walking.   ? Currently in Pain? Yes   ? Pain Score 4    ? Pain Location Back   ? Pain Orientation Lower   ? Pain Descriptors / Indicators Aching   ? Pain Type Chronic pain   ? Pain Onset Yesterday   ?  Pain Frequency Intermittent   ? ?  ?  ? ?  ? ? ? ?Patient mowed his yard yesterday. Has some slight back pain.  ? [facility] PT Assessment - 10/12/21 0001   ? ?  ? Standardized Balance Assessment  ? Standardized Balance Assessment Berg Balance Test   ?  ? Berg Balance Test  ? Sit to Stand Able to stand without using hands and stabilize independently   ? Standing Unsupported Able to stand safely 2 minutes   ? Sitting with Back Unsupported but Feet Supported on Floor or Stool Able to sit safely and securely 2 minutes   ? Stand to Sit Sits safely with minimal use of hands   ? Transfers Able to transfer safely, minor use of hands   ? Standing Unsupported with Eyes Closed Able to stand 10 seconds safely   ? Standing Unsupported with Feet  Together Able to place feet together independently and stand 1 minute safely   ? From Standing, Reach Forward with Outstretched Arm Can reach forward >12 cm safely (5")   ? From Standing Position, Pick up Object from Floor Able to pick up shoe, needs supervision   ? From Standing Position, Turn to Look Behind Over each Shoulder Looks behind from both sides and weight shifts well   ? Turn 360 Degrees Able to turn 360 degrees safely but slowly   ? Standing Unsupported, Alternately Place Feet on Step/Stool Able to complete >2 steps/needs minimal assist   ? Standing Unsupported, One Foot in Front Needs help to step but can hold 15 seconds   ? Standing on One Leg Tries to lift leg/unable to hold 3 seconds but remains standing independently   ? Total Score 43   ? ?  ?  ? ?  ? ? ?RECERT ? ?HEP : compliant  ?FOTO: 60%  ?10 MWT: 8 seconds with rollator  ?6 min walk test : 940 ft with rollator ; BP afterwards 111/75 ?Glute med strength ?LLE Single limb stance: RLE 10 seconds LLE unable to perform  ? ?New Goals:  ?BERG: 43/56 ?stair negotiation: very challenging with weight shift onto LLE ? ? ?Pt educated throughout session about proper posture and technique with exercises. Improved exercise technique, movement at target joints, use of target muscles after min to mod verbal, visual, tactile cues. ? ?Patient shows significant progress towards functional goals. New goals addressing balance and stairs incorporated. He has met his 10 MWT goal at this time and getting closer to community ambulation distance with 6 Min walk test. Patient is highly motivated for progression of mobility. He has decreased episodes of knee hyperextension of LLE. Patient will continue to benefit from skilled physical therapy intervention in order to improve his strength, ambulatory capacity, balance, and increase his overall function and safety with everyday ambulation ? ? ? ? ? ? ? ? ? ? ? ? ? ? ? ? ? ? ? ? ? PT Education - 10/12/21 1351   ? ? Education  Details recert, goals   ? Person(s) Educated Patient   ? Methods Explanation;Demonstration;Tactile cues;Verbal cues   ? Comprehension Verbalized understanding;Returned demonstration;Verbal cues required;Tactile cues required   ? ?  ?  ? ?  ? ? ? PT Short Term Goals - 08/22/21 1358   ? ?  ? PT SHORT TERM GOAL #1  ? Title Patient will be independent in home exercise program to improve strength/mobility for better functional independence with ADLs.   ? Baseline 8/9: HEP to be  given next session, 10/31: patient reports compliance with HEP and would like progressions added next session. 08/22/2021=Patient verbalized that he is walking and doing his LE exercises with no questions at this time.   ? Time 4   ? Period Weeks   ? Status Achieved   ? Target Date 08/16/21   ? ?  ?  ? ?  ? ? ? ? PT Long Term Goals - 10/12/21 1410   ? ?  ? PT LONG TERM GOAL #1  ? Title Patient will increase FOTO score to equal to or greater than 70%    to demonstrate statistically significant improvement in mobility and quality of life.   ? Baseline 8/9: 64%; risk adjusted 34%; 03/21/21 FOTO: 45; 04/24/21 FOTO: 49% ; 08/22/2021= 56% 4/20: 60%   ? Time 12   ? Period Weeks   ? Status Partially Met   ? Target Date 01/04/22   ?  ? PT LONG TERM GOAL #2  ? Title Patient will increase Berg Balance score by > 6 points to demonstrate decreased fall risk during functional activities.   ? Baseline 4/20: 43/56   ? Time 12   ? Period Weeks   ? Status New   ? Target Date 01/04/22   ?  ? PT LONG TERM GOAL #3  ? Title Patient will increase 10 meter walk test to >1.63ms as to improve gait speed for better community ambulation and to reduce fall risk.   ? Baseline 8/9: 0.65 m/s with rollator; 10/31: 0.68 m/s with rollator. 07/06/2021= 0.71 m/s using rollator; 08/22/2021= 0.94 m/s using rollator. 4/20: 1.1 m/s with rollator   ? Time 12   ? Period Weeks   ? Status Achieved   ? Target Date 10/11/21   ?  ? PT LONG TERM GOAL #4  ? Title Patient will increase six minute walk  test distance to >1000 for progression to community ambulator and improve gait ability   ? Baseline 8/9: 505 ft with rollator; 03/21/21: 7770fc rollator, 04/24/21: 75534f rollator; 07/06/2021=900 feet; 08/22/2021

## 2021-10-17 ENCOUNTER — Ambulatory Visit: Payer: Medicare PPO | Admitting: Occupational Therapy

## 2021-10-17 ENCOUNTER — Ambulatory Visit: Payer: Medicare PPO | Admitting: Physical Therapy

## 2021-10-17 ENCOUNTER — Encounter: Payer: Self-pay | Admitting: Physical Therapy

## 2021-10-17 DIAGNOSIS — M6281 Muscle weakness (generalized): Secondary | ICD-10-CM

## 2021-10-17 DIAGNOSIS — R2681 Unsteadiness on feet: Secondary | ICD-10-CM

## 2021-10-17 DIAGNOSIS — I890 Lymphedema, not elsewhere classified: Secondary | ICD-10-CM

## 2021-10-17 DIAGNOSIS — R2689 Other abnormalities of gait and mobility: Secondary | ICD-10-CM | POA: Diagnosis not present

## 2021-10-17 DIAGNOSIS — R262 Difficulty in walking, not elsewhere classified: Secondary | ICD-10-CM

## 2021-10-17 DIAGNOSIS — R278 Other lack of coordination: Secondary | ICD-10-CM | POA: Diagnosis not present

## 2021-10-17 DIAGNOSIS — R269 Unspecified abnormalities of gait and mobility: Secondary | ICD-10-CM

## 2021-10-17 NOTE — Therapy (Signed)
[facility] ?[place], [place], [address] ?Phone: [phone]   Fax:  [phone] ? ?Physical Therapy Treatment ? ?Patient Details  ?Name: Dustin Gray ?MRN: [id number] ?Date of Birth: [birth year] ?No data recorded ? ?Encounter Date: 10/17/2021 ? ? PT End of Session - 10/17/21 1709   ? ? Visit Number 32   ? Number of Visits 72   ? Date for PT Re-Evaluation 01/04/22   ? [place] primary  Cert 2/68/34-1/96/22   ? Authorization Time Period 08/23/21-11/23/21 24 visit   ? Authorization - Visit Number 8   ? Authorization - Number of Visits 24   ? Progress Note Due on Visit 50   ? PT Start Time 2979   ? PT Stop Time 8921   ? PT Time Calculation (min) 42 min   ? Equipment Utilized During Treatment Gait belt   ? Activity Tolerance Patient tolerated treatment well;No increased pain;Patient limited by fatigue   ? Behavior During Therapy [facility] for tasks assessed/performed   ? ?  ?  ? ?  ? ? ?Past Medical History:  ?Diagnosis Date  ? Abscess   ? groin  ? Arthritis   ? lower spine  ? Erectile dysfunction   ? Low testosterone   ? Lumbar herniated disc   ? L5  ? Multiple sclerosis ([place])   ? Staph aureus infection   ? ? ?Past Surgical History:  ?Procedure Laterality Date  ? COLONOSCOPY WITH PROPOFOL N/A 07/04/2016  ? Procedure: COLONOSCOPY WITH PROPOFOL;  Surgeon: [name], MD;  Location: [facility];  Service: Endoscopy;  Laterality: N/A;  ? [place]  ? MASS EXCISION  1960  ? POLYPECTOMY  07/04/2016  ? Procedure: POLYPECTOMY;  Surgeon: [name], MD;  Location: [facility];  Service: Endoscopy;;  ? TONSILLECTOMY    ? ? ?There were no vitals filed for this visit. ? ? Subjective Assessment - 10/17/21 1707   ? ? Subjective Pt reports no significant changes since last session. Pt reports coempleting yard work activities in chunks.   ? Pertinent History Patient presents to physical therapy for unsteadiness,  walking instability, fatigue. His PMH includes paroxysmal a fib, MS, lymphedema, hypothyroidism, Graves Disease, arthritis, lumbar herniated disc (L5), depression and neuropathy. Patient first developed symptoms of MS in 2005 and was diagnosed in 2006. Patient has done PT in the past, but hasn't been seen since 2020 at [facility]. Has not been doing his exercises in the past year. Walk outside to garage to ride lawnmower, walks in house. 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 Limitations Standing;Lifting;Walking;House hold activities   ? How long can you sit comfortably? a couple hours   ? How long can you stand comfortably? no more than 10 minutes before pain, have to steady self immediately   ? How long can you walk comfortably? has to use rollator. can walk in a store with a cart   ? Patient Stated Goals want to get stronger and feel mor comfortable walking around. Patient would like to continue strengthening hips and improve endurance and ability to pace self for safety while walking.   ? Pain Onset Yesterday   ? ?  ?  ? ?  ? ? ? ? ? ? ?INTERVENTIONS:  ?  ?By support bar: CGA and use of gait belt unless otherwise stated. ?  ?Neuro re-ed ? ?  Walking over 4 x obstacles with B UE support ( 3 x 1/2 bolsters, 1 x hurdle)  ?3 laps ( down/back) anterior step overs and 3 laps lateral step overs ?- 1 error when hitting L LE on hurdle ? ?Tandem walking with BUE support on airex balance beam 4x length of // bars ? ?Step taps standing on airex pad to 6 inch step- min UE support  ? ? ?  ?TherEx  ?Nustep Lvl 5 RPM> 60 for cardiovascular challenge x 5 minutes   ? ?10x STS - good form with anterior weight shifting, min fatigue noted toward end of reps  ? ?-Step ups 4"  green step with Min UE support on rails x 12 reps each leg ?  ?Note: Portions of this document were prepared using Dragon voice recognition software and although reviewed may contain unintentional dictation errors in  syntax, grammar, or spelling. ? ? ?  ?Pt educated throughout session about proper posture and technique with exercises. Improved exercise technique, movement at target joints, use of target muscles after min to mod verbal, visual, tactile cues. ? ? ? ? ? ? ? ? ? ? ? ? ? ? ? ? ? ? ? ? ? ? ? ? ? ? ? ? ? ? ? PT Education - 10/17/21 1709   ? ? Education Details exercise forma nd technique   ? Person(s) Educated Patient   ? Methods Explanation;Demonstration   ? Comprehension Verbalized understanding;Returned demonstration   ? ?  ?  ? ?  ? ? ? PT Short Term Goals - 08/22/21 1358   ? ?  ? PT SHORT TERM GOAL #1  ? Title Patient will be independent in home exercise program to improve strength/mobility for better functional independence with ADLs.   ? Baseline 8/9: HEP to be given next session, 10/31: patient reports compliance with HEP and would like progressions added next session. 08/22/2021=Patient verbalized that he is walking and doing his LE exercises with no questions at this time.   ? Time 4   ? Period Weeks   ? Status Achieved   ? Target Date 08/16/21   ? ?  ?  ? ?  ? ? ? ? PT Long Term Goals - 10/12/21 1410   ? ?  ? PT LONG TERM GOAL #1  ? Title Patient will increase FOTO score to equal to or greater than 70%    to demonstrate statistically significant improvement in mobility and quality of life.   ? Baseline 8/9: 64%; risk adjusted 34%; 03/21/21 FOTO: 45; 04/24/21 FOTO: 49% ; 08/22/2021= 56% 4/20: 60%   ? Time 12   ? Period Weeks   ? Status Partially Met   ? Target Date 01/04/22   ?  ? PT LONG TERM GOAL #2  ? Title Patient will increase Berg Balance score by > 6 points to demonstrate decreased fall risk during functional activities.   ? Baseline 4/20: 43/56   ? Time 12   ? Period Weeks   ? Status New   ? Target Date 01/04/22   ?  ? PT LONG TERM GOAL #3  ? Title Patient will increase 10 meter walk test to >1.71m/s as to improve gait speed for better community ambulation and to reduce fall risk.   ? Baseline 8/9: 0.65  m/s with rollator; 10/31: 0.68 m/s with rollator. 07/06/2021= 0.71 m/s using rollator; 08/22/2021= 0.94 m/s using rollator. 4/20: 1.1 m/s with rollator   ? Time 12   ? Period  Weeks   ? Status Achieved   ? Target Date 10/11/21   ?  ? PT LONG TERM GOAL #4  ? Title Patient will increase six minute walk test distance to >1000 for progression to community ambulator and improve gait ability   ? Baseline 8/9: 505 ft with rollator; 03/21/21: 720f c rollator, 04/24/21: 7577fc rollator; 07/06/2021=900 feet; 08/22/2021= 935 feet with use of Rollator 44/20: 940 ft   ? Time 12   ? Period Weeks   ? Status Partially Met   ? Target Date 01/04/22   ?  ? PT LONG TERM GOAL #5  ? Title Patient will increase glute medius strength on Left LE from 3-/5 to 4/5 to improve stability, gait mechanics, and functional strength.   ? Baseline 10/31: 3-/5; 07/06/2021= 3-/5; 08/22/2021=3-/5 unable to achieve full ROM yet able to withstand some resistance with testing 4/20: unable to obtainfull ROM against gravity   ? Time 12   ? Period Weeks   ? Status On-going   ? Target Date 01/04/22   ?  ? Additional Long Term Goals  ? Additional Long Term Goals Yes   ?  ? PT LONG TERM GOAL #6  ? Title Patient will increase Left single leg stance time to 15 seconds or greater to increase safety in shower and independence with ADLs.   ? Baseline 10/31; 1.83 sec without UE support; 08/22/2021=2-3  sec  on left; 6 sec on right 4/20: RLE 10 seconds LLE unable to perform   ? Time 12   ? Period Weeks   ? Status On-going   ? Target Date 01/04/22   ?  ? PT LONG TERM GOAL #7  ? Title Patient will safely negotiate 13 steps with handrails to safely enter/exit mothers house.   ? Baseline 4/20: very challenging   ? Time 12   ? Period Weeks   ? Status New   ? Target Date 01/04/22   ? ?  ?  ? ?  ? ? ? ? ? ? ? ? Plan - 10/17/21 1710   ? ? Clinical Impression Statement Patient is pleasant and demonstrates excellent motivation for completion of physical therapy activities.  Patient  continues to progress with lower extremity strengthening, balance, and endurance activities.  Patient still is unstable with ambulation without assistive device but demonstrates improved ambulation and balan

## 2021-10-17 NOTE — Patient Instructions (Signed)

## 2021-10-17 NOTE — Therapy (Signed)
[place] ?[facility] ?[place], [place], [address] ?Phone: [phone]   Fax:  [phone] ? ?Occupational Therapy Treatment ? ?Patient Details  ?Name: Dustin Gray ?MRN: [id number] ?Date of Birth: 02-09-1956 ?Referring Provider (OT): Staci Acosta, MD ? ? ?Encounter Date: 10/17/2021 ? ? OT End of Session - 10/17/21 1516   ? ? Visit Number 21   ? Number of Visits 36   ? Date for OT Re-Evaluation 01/03/22   ? OT Start Time 0300   ? OT Stop Time 0400   ? OT Time Calculation (min) 60 min   ? Activity Tolerance Patient tolerated treatment well;No increased pain   ? Behavior During Therapy [facility] for tasks assessed/performed   ? ?  ?  ? ?  ? ? ?Past Medical History:  ?Diagnosis Date  ? Abscess   ? groin  ? Arthritis   ? lower spine  ? Erectile dysfunction   ? Low testosterone   ? Lumbar herniated disc   ? L5  ? Multiple sclerosis ([place])   ? Staph aureus infection   ? ? ?Past Surgical History:  ?Procedure Laterality Date  ? COLONOSCOPY WITH PROPOFOL N/A 07/04/2016  ? Procedure: COLONOSCOPY WITH PROPOFOL;  Surgeon: [name], MD;  Location: [place];  Service: Endoscopy;  Laterality: N/A;  ? [place]  ? MASS EXCISION  1960  ? POLYPECTOMY  07/04/2016  ? Procedure: POLYPECTOMY;  Surgeon: [name], MD;  Location: [place];  Service: Endoscopy;;  ? TONSILLECTOMY    ? ? ?There were no vitals filed for this visit. ? ? Subjective Assessment - 10/17/21 1517   ? ? Subjective  Mr. Dustin Gray presents for OT visit 21/36 to address BLE lymphedema. Pt denies LE related leg pain. Pt  presents with custom compression stocking in place on the L and multilayer wraps on t the RLE . Pt has no new complaints. Pt denies LE-related leg pain today. Pt custom compression stocking for RLE have not yet arrived from DME        vendor.   ? Pertinent History relevant to LE: HTN, MS, Herniated C-5-C6, L5-S1, chronic low back pain, persistent Afib,Hx LLE  cellulitis, Graves disease, HYPERthyroidism, OA   ? Limitations difficulty walking, impaired functional mobility and transfers, impaired balance, muscle weakness, L>R,, altered sensation, chronic back pain , chronic leg swelling and associated pain, spasticity in legs   ? Repetition Increases Symptoms   ? Special Tests +Stemmer sign, L>R; Intake FOTO: 51/100   ? Patient Stated Goals Learn about lymphedema and explore treatment options   ? Currently in Pain? No/denies   ? Pain Onset Yesterday   ? Pain Onset More than a month ago   ? ?  ?  ? ?  ? ? ? ? ? ? ? ? ? ? ? ? ? ? ? OT Treatments/Exercises (OP) - 10/17/21 1601   ? ?  ? ADLs  ? ADL Education Given Yes   ?  ? Manual Therapy  ? Manual Therapy Edema management;Compression Bandaging;Manual Lymphatic Drainage (MLD)   ? Manual Lymphatic Drainage (MLD) MLD to LLE/LLQ utilizing short neck sequence, diaphragmatic breathing, functional inguinal LN and proximal nto distal J strokes to each lower extremity segment. Fially repeat 3 retrograde sweeps to terminus and repeat short neck.   ? Compression Bandaging 4 layer knee length compression wrap , including foot , from base of toes to popliteal fossa using stockinett as base layer,  then single layer Rosidal foam under 1 each 8,10 and 12 cm wide short stretch bandages.   ? ?  ?  ? ?  ? ? ? ? ? ? ? ? ? OT Education - 10/17/21 1606   ? ? Education Details Continued Pt/ CG edu for lymphedema self care  and home program throughout session. Topics include multilayer, gradient compression wrapping, simple self-MLD, therapeutic lymphatic pumping exercises, skin/nail care, risk reduction factors and LE precautions, compression garments/recommendations and wear and care schedule and compression garment donning / doffing using assistive devices. All questions answered to the Pt's satisfaction, and Pt demonstrates understanding by report.   ? Person(s) Educated Patient   ? Methods Explanation;Demonstration;Handout   ? Comprehension  Verbalized understanding;Returned demonstration;Need further instruction   ? ?  ?  ? ?  ? ? ? ? ? ? OT Long Term Goals - 10/09/21 1610   ? ?  ? OT LONG TERM GOAL #1  ? Title Given this patient?s Intake score of 51/100 on the functional outcomes FOTO tool, patient will experience an increase in function of 5 points to improve basic and instrumental ADLs performance, including lymphedema self-care.   ? Baseline Max A   ? Time 12   ? Period Weeks   ? Status Partially Met   08/08/21 ( OT visit 9) increased 2 points tfrom 51/100 initially to 53100 today.  ? Target Date 01/07/22   ?  ? OT LONG TERM GOAL #2  ? Title Pt will be able to apply knee length, multi-layer, short stretch compression wraps to one limb at a time using gradient techniques with MAX CG ASSISTANCE to decrease limb volume, to limit infection risk, and to limit lymphedema progression.   ? Baseline Dependent   ? Time 4   ? Period Days   ? Status Achieved   Pt met and exceeded this goal as he is able to wrap independently  ?  ? OT LONG TERM GOAL #3  ? Title Pt will demonstrate understanding of lymphedema prevention strategies by identifying and discussing 5 precautions using printed reference (modified assistance) to reduce risk of progression and to limit infection risk.   ? Baseline Max A   ? Time 4   ? Period Days   ? Status Achieved   ? Target Date --   4th OT Rx visit  ?  ? OT LONG TERM GOAL #4  ? Title Pt will achieve at least a 10% limb volume reduction in bilateral legs to return limb to normal size and shape,  to limit lymphedema progression and to limit infection risk.   ? Baseline mAx   ? Time 12   ? Period Weeks   ? Status Partially Met   LLE reduced by 18.8 % in 10 visits. RLE TBA next bisit. By visual assessment appears to be reduced ~ 15% from initial.  ? Target Date 01/07/22   ?  ? OT LONG TERM GOAL #5  ? Title With MAX CG ASSISTANCE Pt will achieve and sustain a least 85% compliance with all 4 LE self-care home program components throughout  Intensive Phase CDT, including modified simple self-MLD, daily skin inspection and care, lymphatic pumping the ex, 23/7 compression wraps to sustain clinical gains made in CDT and to limit lymphedema progression and further functional decline.   ? Baseline Dependent   ? Time 12   ? Period Weeks   ? Status Achieved   Met and exceeded goal. Pt modified independent (extra time)  with all home program components and consistently 85% compliant w LE self care  ? ?  ?  ? ?  ? ? ? ? ? ? ? ? Plan - 10/17/21 1607   ? ? Clinical Impression Statement Pt tolerated  RLE/RLQ MLD wo increased pain. Limb volume in RLE is significantly less than it was when we commenced CDT to the LLE.  Applied gradient compression to RLE as established. Good session.   ? OT Occupational Profile and History Comprehensive Assessment- Review of records and extensive additional review of physical, cognitive, psychosocial history related to current functional performance   ? Occupational performance deficits (Please refer to evaluation for details): ADL's;IADL's;Work;Leisure;Social Participation;Other   role performance  ? Body Structure / Function / Physical Skills ADL;Edema;Skin integrity;Flexibility;Pain;ROM;Decreased knowledge of use of DME;Scar mobility;IADL   ? Rehab Potential Good   ? Clinical Decision Making Several treatment options, min-mod task modification necessary   ? Comorbidities Affecting Occupational Performance: Presence of comorbidities impacting occupational performance   ? Modification or Assistance to Complete Evaluation  Min-Moderate modification of tasks or assist with assess necessary to complete eval   ? OT Frequency 2x / week   ? OT Duration 12 weeks   and PRN for follow along  ? OT Treatment/Interventions Self-care/ADL training;Therapeutic exercise;Manual Therapy;Coping strategies training;Therapeutic activities;Manual lymph drainage;DME and/or AE instruction;Compression bandaging;Other (comment);Patient/family education    skin care to limit infection risk  ? Plan Complete Decongestive Therapy (CDT) One leg at a time to limit fall LLE first. Manual lymphatic drainage (MLD), skin care, ther ex, compression wraps, then   ? OT Home E

## 2021-10-19 ENCOUNTER — Ambulatory Visit: Payer: Medicare PPO

## 2021-10-19 DIAGNOSIS — R278 Other lack of coordination: Secondary | ICD-10-CM | POA: Diagnosis not present

## 2021-10-19 DIAGNOSIS — R269 Unspecified abnormalities of gait and mobility: Secondary | ICD-10-CM | POA: Diagnosis not present

## 2021-10-19 DIAGNOSIS — R2681 Unsteadiness on feet: Secondary | ICD-10-CM | POA: Diagnosis not present

## 2021-10-19 DIAGNOSIS — I890 Lymphedema, not elsewhere classified: Secondary | ICD-10-CM | POA: Diagnosis not present

## 2021-10-19 DIAGNOSIS — R2689 Other abnormalities of gait and mobility: Secondary | ICD-10-CM | POA: Diagnosis not present

## 2021-10-19 DIAGNOSIS — M6281 Muscle weakness (generalized): Secondary | ICD-10-CM

## 2021-10-19 DIAGNOSIS — R262 Difficulty in walking, not elsewhere classified: Secondary | ICD-10-CM | POA: Diagnosis not present

## 2021-10-19 NOTE — Therapy (Addendum)
[place] ?[facility] ?[address] ?[place], [place], [address] ?Phone: [phone]   Fax:  [phone] ? ?Physical Therapy Treatment/Physical Therapy Progress Note ? ? ?Dates of reporting period  08/22/21   to   10/19/21 ? ?Patient Details  ?Name: Dustin Gray ?MRN: [id number] ?Date of Birth: 02/24/1956 ?No data recorded ? ?Encounter Date: 10/19/2021 ? ? ? ? ?Past Medical History:  ?Diagnosis Date  ? Abscess   ? groin  ? Arthritis   ? lower spine  ? Erectile dysfunction   ? Low testosterone   ? Lumbar herniated disc   ? L5  ? Multiple sclerosis (HCC)   ? Staph aureus infection   ? ? ?Past Surgical History:  ?Procedure Laterality Date  ? COLONOSCOPY WITH PROPOFOL N/A 07/04/2016  ? Procedure: COLONOSCOPY WITH PROPOFOL;  Surgeon: Darren Wohl, MD;  Location: [facility];  Service: Endoscopy;  Laterality: N/A;  ? FINGER SURGERY  1964  ? MASS EXCISION  1960  ? POLYPECTOMY  07/04/2016  ? Procedure: POLYPECTOMY;  Surgeon: Darren Wohl, MD;  Location: [facility];  Service: Endoscopy;;  ? TONSILLECTOMY    ? ? ?There were no vitals filed for this visit. ? ? ? ? ?*Goals and new PT recert assessed on 10/12/21. Please see note on that date for progress towards goals. ? ? ? ?There.ex:  ?  ?x10 STS - good form with anterior weight shifting. CGA ?2x10 with R foot on airex pad for LLE strengthening. CGA ?  ?Step ups 4"  green step with SUE support, focus on stepping up on LLE and eccentric control with descent. X10.  ?Progressed to x10 with 6? step. SBA  ? ?Heel raises with heels over 4? step: 2x20, BUE support  ? ?L SLS: x5, 20 sec reps with RUE support. SBA.  ?Neuro Re-Ed:  ? ?Ambulation 150' for tolerance for ambulation. CGA. Without UE support, pt performs step to gait with hip drop on each LE during stance.  ? ? ? ? ? ? ? ? ? PT Short Term Goals - 08/22/21 1358   ? ?  ? PT SHORT TERM GOAL #1  ? Title Patient will be independent in home exercise program to improve  strength/mobility for better functional independence with ADLs.   ? Baseline 8/9: HEP to be given next session, 10/31: patient reports compliance with HEP and would like progressions added next session. 08/22/2021=Patient verbalized that he is walking and doing his LE exercises with no questions at this time.   ? Time 4   ? Period Weeks   ? Status Achieved   ? Target Date 08/16/21   ? ?  ?  ? ?  ? ? ? ? PT Long Term Goals - 10/12/21 1410   ? ?  ? PT LONG TERM GOAL #1  ? Title Patient will increase FOTO score to equal to or greater than 70%    to demonstrate statistically significant improvement in mobility and quality of life.   ? Baseline 8/9: 64%; risk adjusted 34%; 03/21/21 FOTO: 45; 04/24/21 FOTO: 49% ; 08/22/2021= 56% 4/20: 60%   ? Time 12   ? Period Weeks   ? Status Partially Met   ? Target Date 01/04/22   ?  ? PT LONG TERM GOAL #2  ? Title Patient will increase Berg Balance score by > 6 points to demonstrate decreased fall risk during functional activities.   ? Baseline 4/20: 43/56   ? Time 12   ?   Period Weeks   ? Status New   ? Target Date 01/04/22   ?  ? PT LONG TERM GOAL #3  ? Title Patient will increase 10 meter walk test to >1.16ms as to improve gait speed for better community ambulation and to reduce fall risk.   ? Baseline 8/9: 0.65 m/s with rollator; 10/31: 0.68 m/s with rollator. 07/06/2021= 0.71 m/s using rollator; 08/22/2021= 0.94 m/s using rollator. 4/20: 1.1 m/s with rollator   ? Time 12   ? Period Weeks   ? Status Achieved   ? Target Date 10/11/21   ?  ? PT LONG TERM GOAL #4  ? Title Patient will increase six minute walk test distance to >1000 for progression to community ambulator and improve gait ability   ? Baseline 8/9: 505 ft with rollator; 03/21/21: 7751fc rollator, 04/24/21: 75557f rollator; 07/06/2021=900 feet; 08/22/2021= 935 feet with use of Rollator 44/20: 940 ft   ? Time 12   ? Period Weeks   ? Status Partially Met   ? Target Date 01/04/22   ?  ? PT LONG TERM GOAL #5  ? Title Patient will  increase glute medius strength on Left LE from 3-/5 to 4/5 to improve stability, gait mechanics, and functional strength.   ? Baseline 10/31: 3-/5; 07/06/2021= 3-/5; 08/22/2021=3-/5 unable to achieve full ROM yet able to withstand some resistance with testing 4/20: unable to obtainfull ROM against gravity   ? Time 12   ? Period Weeks   ? Status On-going   ? Target Date 01/04/22   ?  ? Additional Long Term Goals  ? Additional Long Term Goals Yes   ?  ? PT LONG TERM GOAL #6  ? Title Patient will increase Left single leg stance time to 15 seconds or greater to increase safety in shower and independence with ADLs.   ? Baseline 10/31; 1.83 sec without UE support; 08/22/2021=2-3  sec  on left; 6 sec on right 4/20: RLE 10 seconds LLE unable to perform   ? Time 12   ? Period Weeks   ? Status On-going   ? Target Date 01/04/22   ?  ? PT LONG TERM GOAL #7  ? Title Patient will safely negotiate 13 steps with handrails to safely enter/exit mothers house.   ? Baseline 4/20: very challenging   ? Time 12   ? Period Weeks   ? Status New   ? Target Date 01/04/22   ? ?  ?  ? ?  ? ? ? ? ? ? ? ? ? ? ?Patient will benefit from skilled therapeutic intervention in order to improve the following deficits and impairments:  Abnormal gait, Cardiopulmonary status limiting activity, Decreased activity tolerance, Decreased coordination, Decreased balance, Decreased endurance, Decreased mobility, Decreased strength, Decreased range of motion, Difficulty walking, Impaired flexibility, Impaired sensation, Postural dysfunction, Improper body mechanics ? ?Visit Diagnosis: ?Abnormality of gait and mobility ? ?Unsteadiness on feet ? ?Muscle weakness (generalized) ? ?Difficulty in walking, not elsewhere classified ? ?Other abnormalities of gait and mobility ? ?Other lack of coordination ? ? ? ? ?Problem List ?Patient Active Problem List  ? Diagnosis Date Noted  ? Persistent atrial fibrillation ([facility] ? Graves disease   ? Abdominal bloating   ? Atrial  fibrillation with RVR (HCC[place]4/01/2020  ? Hyperthyroidism   ? Atrial fibrillation with rapid ventricular response ([facility]CC[facility]4/12/2019  ? Leg edema   ? Left leg cellulitis   ? Special screening for malignant neoplasms,  colon   ? Polyp of sigmoid colon   ? Benign neoplasm of ascending colon   ? Rectal polyp   ? Herniated nucleus pulposus, L5-S1 11/09/2015  ? Low back pain 10/25/2015  ? Herniated nucleus pulposus, C5-6 right 10/06/2015  ? Dysesthesia 09/16/2015  ? Spasticity 09/16/2015  ? Unsteady gait 09/16/2015  ? Multiple sclerosis ([place]) 06/08/2011  ? ? ?Salem Caster. Fairly IV, PT, DPT ?Physical Therapist- [place]  ?[facility]  ?10/25/2021, 8:16 AM ? ?[place] ?[facility] ?[place], [place], [address] ?Phone: [phone]   Fax:  [phone] ? ?Name: ARKIN IMRAN ?MRN: [id number] ?Date of Birth: 1955-12-23 ? ? ? ?

## 2021-10-24 ENCOUNTER — Ambulatory Visit: Payer: Medicare PPO | Attending: Physician Assistant | Admitting: Occupational Therapy

## 2021-10-24 ENCOUNTER — Ambulatory Visit: Payer: Medicare PPO | Admitting: Physical Therapy

## 2021-10-24 DIAGNOSIS — M6281 Muscle weakness (generalized): Secondary | ICD-10-CM | POA: Insufficient documentation

## 2021-10-24 DIAGNOSIS — R2681 Unsteadiness on feet: Secondary | ICD-10-CM

## 2021-10-24 DIAGNOSIS — R262 Difficulty in walking, not elsewhere classified: Secondary | ICD-10-CM

## 2021-10-24 DIAGNOSIS — I890 Lymphedema, not elsewhere classified: Secondary | ICD-10-CM | POA: Diagnosis not present

## 2021-10-24 DIAGNOSIS — R269 Unspecified abnormalities of gait and mobility: Secondary | ICD-10-CM

## 2021-10-24 DIAGNOSIS — R278 Other lack of coordination: Secondary | ICD-10-CM | POA: Diagnosis not present

## 2021-10-24 DIAGNOSIS — R2689 Other abnormalities of gait and mobility: Secondary | ICD-10-CM | POA: Insufficient documentation

## 2021-10-24 NOTE — Therapy (Signed)
[place] ?[facility] ?[place], [place], [address] ?Phone: [phone]   Fax:  [phone] ? ?Physical Therapy Treatment ? ?Patient Details  ?Name: Dustin Gray ?MRN: [id number] ?Date of Birth: [birth year] ?No data recorded ? ?Encounter Date: 10/24/2021 ? ? PT End of Session - 10/24/21 1610   ? ? Visit Number 51   ? Number of Visits 72   ? Date for PT Re-Evaluation 01/04/22   ? [place] primary  Cert 02/26/99-03/17/29   ? Authorization Time Period 08/23/21-11/23/21 24 visit   ? Authorization - Visit Number 8   ? Authorization - Number of Visits 24   ? Progress Note Due on Visit 50   ? PT Start Time 0762   ? PT Stop Time 2633   ? PT Time Calculation (min) 39 min   ? Equipment Utilized During Treatment Gait belt   ? Activity Tolerance Patient tolerated treatment well;No increased pain   ? Behavior During Therapy [facility] for tasks assessed/performed   ? ?  ?  ? ?  ? ? ?Past Medical History:  ?Diagnosis Date  ? Abscess   ? groin  ? Arthritis   ? lower spine  ? Erectile dysfunction   ? Low testosterone   ? Lumbar herniated disc   ? L5  ? Multiple sclerosis ([place])   ? Staph aureus infection   ? ? ?Past Surgical History:  ?Procedure Laterality Date  ? COLONOSCOPY WITH PROPOFOL N/A 07/04/2016  ? Procedure: COLONOSCOPY WITH PROPOFOL;  Surgeon: [name], MD;  Location: [place];  Service: Endoscopy;  Laterality: N/A;  ? [place]  ? MASS EXCISION  1960  ? POLYPECTOMY  07/04/2016  ? Procedure: POLYPECTOMY;  Surgeon: [name], MD;  Location: [place];  Service: Endoscopy;;  ? TONSILLECTOMY    ? ? ?There were no vitals filed for this visit. ? ? Subjective Assessment - 10/24/21 1610   ? ? Subjective Pt reports normal amount of pain in his low back. No falls, has been able to complete some yard work but has increased difficulty with spraying weeds.   ? Pertinent History Patient presents to physical therapy for  unsteadiness, walking instability, fatigue. His PMH includes paroxysmal a fib, MS, lymphedema, hypothyroidism, Graves Disease, arthritis, lumbar herniated disc (L5), depression and neuropathy. Patient first developed symptoms of MS in 2005 and was diagnosed in 2006. Patient has done PT in the past, but hasn't been seen since 2020 at [facility]. Has not been doing his exercises in the past year. Walk outside to garage to ride lawnmower, walks in house. 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 Limitations Standing;Lifting;Walking;House hold activities   ? How long can you sit comfortably? a couple hours   ? How long can you stand comfortably? no more than 10 minutes before pain, have to steady self immediately   ? How long can you walk comfortably? has to use rollator. can walk in a store with a cart   ? Patient Stated Goals want to get stronger and feel mor comfortable walking around. Patient would like to continue strengthening hips and improve endurance and ability to pace self for safety while walking.   ? Pain Score 2    ? Pain Location Back   ? Pain Onset Yesterday   ? ?  ?  ? ?  ? ? ? ? ?INTERVENTIONS:  ?  ?  By support bar: CGA and use of gait belt unless otherwise stated. ?  ?Neuro re-ed ? ?Hurdle step over to theraband pad for external cue for step length ? ?L LE on theraband pad R LE on 6 inch step ?- x 45 seconds head still, x 45 vertical and x 45 lateral head turns  ? ? ?Step taps standing on airex pad to 6 inch step- min UE support  ?-x 20 alternatind and x 20 lateral step up on the right with LLE for stability  ? ? ?  ?TherEx  ? ?Nustep progressive from level 2 to level 5,  2 min ea total of 8 minutes ? ?14x STS - good form with anterior weight shifting, min fatigue noted toward end of reps  ? ?-Step ups 4"  green step with Min UE support on rails x 12 reps each leg ?  ?Note: Portions of this document were prepared using Dragon voice recognition software and  although reviewed may contain unintentional dictation errors in syntax, grammar, or spelling. ? ? ?  ?Pt educated throughout session about proper posture and technique with exercises. Improved exercise technique, movement at target joints, use of target muscles after min to mod verbal, visual, tactile cues. ? ? ? ? ? ? ? ? ? ? ? ? ? ? ? ? ? ? ? ? ? ? ? ? ? ? PT Education - 10/25/21 0847   ? ? Education Details exercise form and technique   ? Person(s) Educated Patient   ? Methods Explanation;Demonstration   ? Comprehension Verbalized understanding;Returned demonstration   ? ?  ?  ? ?  ? ? ? PT Short Term Goals - 08/22/21 1358   ? ?  ? PT SHORT TERM GOAL #1  ? Title Patient will be independent in home exercise program to improve strength/mobility for better functional independence with ADLs.   ? Baseline 8/9: HEP to be given next session, 10/31: patient reports compliance with HEP and would like progressions added next session. 08/22/2021=Patient verbalized that he is walking and doing his LE exercises with no questions at this time.   ? Time 4   ? Period Weeks   ? Status Achieved   ? Target Date 08/16/21   ? ?  ?  ? ?  ? ? ? ? PT Long Term Goals - 10/12/21 1410   ? ?  ? PT LONG TERM GOAL #1  ? Title Patient will increase FOTO score to equal to or greater than 70%    to demonstrate statistically significant improvement in mobility and quality of life.   ? Baseline 8/9: 64%; risk adjusted 34%; 03/21/21 FOTO: 45; 04/24/21 FOTO: 49% ; 08/22/2021= 56% 4/20: 60%   ? Time 12   ? Period Weeks   ? Status Partially Met   ? Target Date 01/04/22   ?  ? PT LONG TERM GOAL #2  ? Title Patient will increase Berg Balance score by > 6 points to demonstrate decreased fall risk during functional activities.   ? Baseline 4/20: 43/56   ? Time 12   ? Period Weeks   ? Status New   ? Target Date 01/04/22   ?  ? PT LONG TERM GOAL #3  ? Title Patient will increase 10 meter walk test to >1.38ms as to improve gait speed for better community  ambulation and to reduce fall risk.   ? Baseline 8/9: 0.65 m/s with rollator; 10/31: 0.68 m/s with rollator. 07/06/2021= 0.71 m/s using rollator;  08/22/2021= 0.94 m/s using rollator. 4/20: 1.1 m/s with rollator   ? Time 12   ? Period Weeks   ? Status Achieved   ? Target Date 10/11/21   ?  ? PT LONG TERM GOAL #4  ? Title Patient will increase six minute walk test distance to >1000 for progression to community ambulator and improve gait ability   ? Baseline 8/9: 505 ft with rollator; 03/21/21: 744f c rollator, 04/24/21: 7525fc rollator; 07/06/2021=900 feet; 08/22/2021= 935 feet with use of Rollator 44/20: 940 ft   ? Time 12   ? Period Weeks   ? Status Partially Met   ? Target Date 01/04/22   ?  ? PT LONG TERM GOAL #5  ? Title Patient will increase glute medius strength on Left LE from 3-/5 to 4/5 to improve stability, gait mechanics, and functional strength.   ? Baseline 10/31: 3-/5; 07/06/2021= 3-/5; 08/22/2021=3-/5 unable to achieve full ROM yet able to withstand some resistance with testing 4/20: unable to obtainfull ROM against gravity   ? Time 12   ? Period Weeks   ? Status On-going   ? Target Date 01/04/22   ?  ? Additional Long Term Goals  ? Additional Long Term Goals Yes   ?  ? PT LONG TERM GOAL #6  ? Title Patient will increase Left single leg stance time to 15 seconds or greater to increase safety in shower and independence with ADLs.   ? Baseline 10/31; 1.83 sec without UE support; 08/22/2021=2-3  sec  on left; 6 sec on right 4/20: RLE 10 seconds LLE unable to perform   ? Time 12   ? Period Weeks   ? Status On-going   ? Target Date 01/04/22   ?  ? PT LONG TERM GOAL #7  ? Title Patient will safely negotiate 13 steps with handrails to safely enter/exit mothers house.   ? Baseline 4/20: very challenging   ? Time 12   ? Period Weeks   ? Status New   ? Target Date 01/04/22   ? ?  ?  ? ?  ? ? ? ? ? ? ? ? Plan - 10/24/21 1611   ? ? Clinical Impression Statement Patient continues to progress with lower extremity  strengthening endurance as well as with left lower extremity stability exercises in order to improve his gait mechanics and walking.  Patient continues have difficulty walking without assistive device demonstrating ste

## 2021-10-25 NOTE — Therapy (Signed)
[place] [facility] ?[place], [place], [address] ?Phone: [phone]   Fax:  [phone] ? ?Occupational Therapy Treatment ? ?Patient Details  ?Name: Dustin Gray ?MRN: [id number] ?Date of Birth: Apr 03, 1956 ?Referring Provider (OT): Staci Acosta, MD ? ? ?Encounter Date: 10/24/2021 ? ? OT End of Session - 10/24/21 1518   ? ? Visit Number 22   ? Number of Visits 36   ? Date for OT Re-Evaluation 01/03/22   ? OT Start Time [phone]   ? OT Stop Time 3335   ? OT Time Calculation (min) 60 min   ? Activity Tolerance Patient tolerated treatment well;No increased pain   ? Behavior During Therapy [facility] for tasks assessed/performed   ? ?  ?  ? ?  ? ? ?Past Medical History:  ?Diagnosis Date  ? Abscess   ? groin  ? Arthritis   ? lower spine  ? Erectile dysfunction   ? Low testosterone   ? Lumbar herniated disc   ? L5  ? Multiple sclerosis ([place])   ? Staph aureus infection   ? ? ?Past Surgical History:  ?Procedure Laterality Date  ? COLONOSCOPY WITH PROPOFOL N/A 07/04/2016  ? Procedure: COLONOSCOPY WITH PROPOFOL;  Surgeon: [name], MD;  Location: [facility];  Service: Endoscopy;  Laterality: N/A;  ? [place]  ? MASS EXCISION  1960  ? POLYPECTOMY  07/04/2016  ? Procedure: POLYPECTOMY;  Surgeon: [name], MD;  Location: [facility];  Service: Endoscopy;;  ? TONSILLECTOMY    ? ? ?There were no vitals filed for this visit. ? ? Subjective Assessment - 10/25/21 0839   ? ? Subjective  [name] presents for OT visit 22/36 to address BLE lymphedema. Pt denies LE related leg pain. Pt  presents with custom compression stocking in place on the L and multilayer wraps on t the RLE . Pt has no new complaints. Pt denies LE-related leg pain today. Pt custom compression stocking for RLE have not yet arrived from DME vendor.   ? Pertinent History relevant to LE: HTN, MS, Herniated C-5-C6, L5-S1, chronic low back pain, persistent Afib,Hx LLE cellulitis,  Graves disease, HYPERthyroidism, OA   ? Limitations difficulty walking, impaired functional mobility and transfers, impaired balance, muscle weakness, L>R,, altered sensation, chronic back pain , chronic leg swelling and associated pain, spasticity in legs   ? Repetition Increases Symptoms   ? Special Tests +Stemmer sign, L>R; Intake FOTO: 51/100   ? Patient Stated Goals Learn about lymphedema and explore treatment options   ? Currently in Pain? No/denies   ? Pain Onset Yesterday   ? Pain Onset More than a month ago   ? ?  ?  ? ?  ? ? ? ? ? ? ? ? ? ? ? ? ? ? ? OT Treatments/Exercises (OP) - 10/25/21 4562   ? ?  ? ADLs  ? ADL Education Given Yes   ?  ? Manual Therapy  ? Manual Therapy Edema management;Compression Bandaging;Manual Lymphatic Drainage (MLD)   ? Manual Lymphatic Drainage (MLD) MLD to LLE/LLQ utilizing short neck sequence, diaphragmatic breathing, functional inguinal LN and proximal nto distal J strokes to each lower extremity segment. Fially repeat 3 retrograde sweeps to terminus and repeat short neck.   ? Compression Bandaging 4 layer knee length compression wrap , including foot , from base of toes to popliteal fossa using stockinett as base layer, then single layer Rosidal foam under 1  each 8,10 and 12 cm wide short stretch bandages.   ? ?  ?  ? ?  ? ? ? ? ? ? ? ? ? OT Education - 10/25/21 0840   ? ? Education Details RLE presents with increased  swelling below the knee today. Skin is stretched tight and shiney and presents with 1+ pitting. This condition implies Pt has been without compression in place   for some time before OT session.RLE comrpession stocking still has not arrived from DME vendor. Continued RLE MLD, simultaneous skin care  and gradient compression wrapping today. Pt tolerated treatment without increased pain. OT emailed DME vendor agfter session requesting garment status check. Cont as per POC.   ? Person(s) Educated Patient   ? Methods Explanation;Demonstration;Handout   ?  Comprehension Verbalized understanding;Returned demonstration;Need further instruction   ? ?  ?  ? ?  ? ? ? ? ? ? OT Long Term Goals - 10/09/21 1610   ? ?  ? OT LONG TERM GOAL #1  ? Title Given this patient?s Intake score of 51/100 on the functional outcomes FOTO tool, patient will experience an increase in function of 5 points to improve basic and instrumental ADLs performance, including lymphedema self-care.   ? Baseline Max A   ? Time 12   ? Period Weeks   ? Status Partially Met   08/08/21 ( OT visit 9) increased 2 points tfrom 51/100 initially to 53100 today.  ? Target Date 01/07/22   ?  ? OT LONG TERM GOAL #2  ? Title Pt will be able to apply knee length, multi-layer, short stretch compression wraps to one limb at a time using gradient techniques with MAX CG ASSISTANCE to decrease limb volume, to limit infection risk, and to limit lymphedema progression.   ? Baseline Dependent   ? Time 4   ? Period Days   ? Status Achieved   Pt met and exceeded this goal as he is able to wrap independently  ?  ? OT LONG TERM GOAL #3  ? Title Pt will demonstrate understanding of lymphedema prevention strategies by identifying and discussing 5 precautions using printed reference (modified assistance) to reduce risk of progression and to limit infection risk.   ? Baseline Max A   ? Time 4   ? Period Days   ? Status Achieved   ? Target Date --   4th OT Rx visit  ?  ? OT LONG TERM GOAL #4  ? Title Pt will achieve at least a 10% limb volume reduction in bilateral legs to return limb to normal size and shape,  to limit lymphedema progression and to limit infection risk.   ? Baseline mAx   ? Time 12   ? Period Weeks   ? Status Partially Met   LLE reduced by 18.8 % in 10 visits. RLE TBA next bisit. By visual assessment appears to be reduced ~ 15% from initial.  ? Target Date 01/07/22   ?  ? OT LONG TERM GOAL #5  ? Title With MAX CG ASSISTANCE Pt will achieve and sustain a least 85% compliance with all 4 LE self-care home program  components throughout Intensive Phase CDT, including modified simple self-MLD, daily skin inspection and care, lymphatic pumping the ex, 23/7 compression wraps to sustain clinical gains made in CDT and to limit lymphedema progression and further functional decline.   ? Baseline Dependent   ? Time 12   ? Period Weeks   ? Status Achieved  Met and exceeded goal. Pt modified independent (extra time) with all home program components and consistently 85% compliant w LE self care  ? ?  ?  ? ?  ? ? ? ? ? ? ? ? ? ?Patient will benefit from skilled therapeutic intervention in order to improve the following deficits and impairments:   ?  ?  ?  ? ? ?Visit Diagnosis: ?Lymphedema, not elsewhere classified ? ? ? ?Problem List ?Patient Active Problem List  ? Diagnosis Date Noted  ? Persistent atrial fibrillation ([place])   ? Graves disease   ? Abdominal bloating   ? Atrial fibrillation with RVR ([place]) 10/01/2019  ? Hyperthyroidism   ? Atrial fibrillation with rapid ventricular response ([place]) 09/30/2019  ? Leg edema   ? Left leg cellulitis   ? Special screening for malignant neoplasms, colon   ? Polyp of sigmoid colon   ? Benign neoplasm of ascending colon   ? Rectal polyp   ? Herniated nucleus pulposus, L5-S1 11/09/2015  ? Low back pain 10/25/2015  ? Herniated nucleus pulposus, C5-6 right 10/06/2015  ? Dysesthesia 09/16/2015  ? Spasticity 09/16/2015  ? Unsteady gait 09/16/2015  ? Multiple sclerosis ([place]) 06/08/2011  ? ? ?Andrey Spearman, MS, OTR/L, CLT-LANA ?10/25/21 8:47 AM ? ? ?[facility] ?[place], [place], [address] ?Phone: [phone]   Fax:  [phone] ? ?Name: JACOBS GOLAB ?MRN: [id number] ?Date of Birth: 1955/12/29 ? ?

## 2021-10-25 NOTE — Patient Instructions (Signed)

## 2021-10-26 ENCOUNTER — Ambulatory Visit: Payer: Medicare PPO

## 2021-10-26 DIAGNOSIS — I890 Lymphedema, not elsewhere classified: Secondary | ICD-10-CM | POA: Diagnosis not present

## 2021-10-26 DIAGNOSIS — R2689 Other abnormalities of gait and mobility: Secondary | ICD-10-CM | POA: Diagnosis not present

## 2021-10-26 DIAGNOSIS — R278 Other lack of coordination: Secondary | ICD-10-CM | POA: Diagnosis not present

## 2021-10-26 DIAGNOSIS — R2681 Unsteadiness on feet: Secondary | ICD-10-CM

## 2021-10-26 DIAGNOSIS — R269 Unspecified abnormalities of gait and mobility: Secondary | ICD-10-CM

## 2021-10-26 DIAGNOSIS — M6281 Muscle weakness (generalized): Secondary | ICD-10-CM | POA: Diagnosis not present

## 2021-10-26 DIAGNOSIS — R262 Difficulty in walking, not elsewhere classified: Secondary | ICD-10-CM

## 2021-10-26 NOTE — Therapy (Signed)
[place] ?[facility] ?[place], [place], [address] ?Phone: [phone]   Fax:  [phone] ? ?Physical Therapy Treatment ? ?Patient Details  ?Name: Dustin Gray ?MRN: [id number] ?Date of Birth: 18-Apr-1956 ?No data recorded ? ?Encounter Date: 10/26/2021 ? ? PT End of Session - 10/26/21 1003   ? ? Visit Number 69   ? Number of Visits 72   ? Date for PT Re-Evaluation 01/04/22   ? Epworth primary  Cert 9/75/88-09/17/47   ? Authorization Time Period 08/23/21-11/23/21 24 visit   ? Authorization - Visit Number --   ? Authorization - Number of Visits --   ? Progress Note Due on Visit 60   ? PT Start Time 1345   ? PT Stop Time 1430   ? PT Time Calculation (min) 45 min   ? Equipment Utilized During Treatment Gait belt   ? Activity Tolerance Patient tolerated treatment well;No increased pain   ? Behavior During Therapy [facility] for tasks assessed/performed   ? ?  ?  ? ?  ? ? ?Past Medical History:  ?Diagnosis Date  ? Abscess   ? groin  ? Arthritis   ? lower spine  ? Erectile dysfunction   ? Low testosterone   ? Lumbar herniated disc   ? L5  ? Multiple sclerosis ([place])   ? Staph aureus infection   ? ? ?Past Surgical History:  ?Procedure Laterality Date  ? COLONOSCOPY WITH PROPOFOL N/A 07/04/2016  ? Procedure: COLONOSCOPY WITH PROPOFOL;  Surgeon: [name], MD;  Location: [facility];  Service: Endoscopy;  Laterality: N/A;  ? [place]  ? MASS EXCISION  1960  ? POLYPECTOMY  07/04/2016  ? Procedure: POLYPECTOMY;  Surgeon: [name], MD;  Location: [facility];  Service: Endoscopy;;  ? TONSILLECTOMY    ? ? ?There were no vitals filed for this visit. ? ? Subjective Assessment - 10/26/21 1352   ? ? Subjective Pt reports worried about his dog who is having surgery today. Bunnie Pion reports doing well   ? Pertinent History Patient presents to physical therapy for unsteadiness, walking instability, fatigue. His PMH includes  paroxysmal a fib, MS, lymphedema, hypothyroidism, Graves Disease, arthritis, lumbar herniated disc (L5), depression and neuropathy. Patient first developed symptoms of MS in 2005 and was diagnosed in 2006. Patient has done PT in the past, but hasn't been seen since 2020 at [facility]. Has not been doing his exercises in the past year. Walk outside to garage to ride lawnmower, walks in house. 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 Limitations Standing;Lifting;Walking;House hold activities   ? How long can you sit comfortably? a couple hours   ? How long can you stand comfortably? no more than 10 minutes before pain, have to steady self immediately   ? How long can you walk comfortably? has to use rollator. can walk in a store with a cart   ? Patient Stated Goals want to get stronger and feel mor comfortable walking around. Patient would like to continue strengthening hips and improve endurance and ability to pace self for safety while walking.   ? Pain Onset Yesterday   ? ?  ?  ? ?  ? ? ? ? ?Interventions:  ? ?Ladder drill - forward/backward- 1 foot per square x 10 down and back in // bars ? ?Ladder drill - forward/backward 5# AW  each LE -1 foot per square x 10 down and back in // bars. Patient reports as challenging. ? ?Ladder drill - Side step 5# x 8 - down and back in // bars. Patient reports as hard. ? ?Start by standing with B feet on greentherand and then side step off pad and perform mini side squat x 15 reps each side ? ?Step up onto green therapad with 1 LE and then opp LE dynamic high knee march in // bars with minimal UE support x 20 reps each.  ? ?Patient reported fatigued at end of session.  ? ? ?Education provided throughout session via VC/TC and demonstration to facilitate movement at target joints and correct muscle activation for all testing and exercises performed.  ? ? ? ? ? ? ? ? ? ? ? ? ? ? ? ? ? ? ? ? ? ? ? PT Education - 10/27/21 1002   ? ?  Education Details Exercise technique   ? Person(s) Educated Patient   ? Methods Explanation;Demonstration;Tactile cues;Verbal cues   ? Comprehension Verbalized understanding;Returned demonstration;Verbal cues required;Tactile cues required;Need further instruction   ? ?  ?  ? ?  ? ? ? PT Short Term Goals - 08/22/21 1358   ? ?  ? PT SHORT TERM GOAL #1  ? Title Patient will be independent in home exercise program to improve strength/mobility for better functional independence with ADLs.   ? Baseline 8/9: HEP to be given next session, 10/31: patient reports compliance with HEP and would like progressions added next session. 08/22/2021=Patient verbalized that he is walking and doing his LE exercises with no questions at this time.   ? Time 4   ? Period Weeks   ? Status Achieved   ? Target Date 08/16/21   ? ?  ?  ? ?  ? ? ? ? PT Long Term Goals - 10/12/21 1410   ? ?  ? PT LONG TERM GOAL #1  ? Title Patient will increase FOTO score to equal to or greater than 70%    to demonstrate statistically significant improvement in mobility and quality of life.   ? Baseline 8/9: 64%; risk adjusted 34%; 03/21/21 FOTO: 45; 04/24/21 FOTO: 49% ; 08/22/2021= 56% 4/20: 60%   ? Time 12   ? Period Weeks   ? Status Partially Met   ? Target Date 01/04/22   ?  ? PT LONG TERM GOAL #2  ? Title Patient will increase Berg Balance score by > 6 points to demonstrate decreased fall risk during functional activities.   ? Baseline 4/20: 43/56   ? Time 12   ? Period Weeks   ? Status New   ? Target Date 01/04/22   ?  ? PT LONG TERM GOAL #3  ? Title Patient will increase 10 meter walk test to >1.29ms as to improve gait speed for better community ambulation and to reduce fall risk.   ? Baseline 8/9: 0.65 m/s with rollator; 10/31: 0.68 m/s with rollator. 07/06/2021= 0.71 m/s using rollator; 08/22/2021= 0.94 m/s using rollator. 4/20: 1.1 m/s with rollator   ? Time 12   ? Period Weeks   ? Status Achieved   ? Target Date 10/11/21   ?  ? PT LONG TERM GOAL #4  ?  Title Patient will increase six minute walk test distance to >1000 for progression to community ambulator and improve gait ability   ? Baseline 8/9: 505 ft with rollator; 03/21/21: [id number] rollator, 04/24/21: [id number]  rollator; 07/06/2021=900 feet; 08/22/2021= 935 feet with use of Rollator 44/20: 940 ft   ? Time 12   ? Period Weeks   ? Status Partially Met   ? Target Date 01/04/22   ?  ? PT LONG TERM GOAL #5  ? Title Patient will increase glute medius strength on Left LE from 3-/5 to 4/5 to improve stability, gait mechanics, and functional strength.   ? Baseline 10/31: 3-/5; 07/06/2021= 3-/5; 08/22/2021=3-/5 unable to achieve full ROM yet able to withstand some resistance with testing 4/20: unable to obtainfull ROM against gravity   ? Time 12   ? Period Weeks   ? Status On-going   ? Target Date 01/04/22   ?  ? Additional Long Term Goals  ? Additional Long Term Goals Yes   ?  ? PT LONG TERM GOAL #6  ? Title Patient will increase Left single leg stance time to 15 seconds or greater to increase safety in shower and independence with ADLs.   ? Baseline 10/31; 1.83 sec without UE support; 08/22/2021=2-3  sec  on left; 6 sec on right 4/20: RLE 10 seconds LLE unable to perform   ? Time 12   ? Period Weeks   ? Status On-going   ? Target Date 01/04/22   ?  ? PT LONG TERM GOAL #7  ? Title Patient will safely negotiate 13 steps with handrails to safely enter/exit mothers house.   ? Baseline 4/20: very challenging   ? Time 12   ? Period Weeks   ? Status New   ? Target Date 01/04/22   ? ?  ?  ? ?  ? ? ? ? ? ? ? ? Plan - 10/26/21 1007   ? ? Clinical Impression Statement Patient presents with good motivation for today's session today. He was challenged with standing/mobility activities - trying to take a longer or wider step with involved left side. He was fatigued with all standing activities and reported- that was a simple but exhausting workout. He continues to be limited by increased left LE weakness but did perform well with  additional resistance with steps today. Patient continuing to make progress in physical therapy and will continue to benefit from skilled physical therapy in order to improve balance, stability, ambulation function, and

## 2021-10-27 ENCOUNTER — Encounter: Payer: Self-pay | Admitting: Cardiology

## 2021-10-27 ENCOUNTER — Other Ambulatory Visit
Admission: RE | Admit: 2021-10-27 | Discharge: 2021-10-27 | Disposition: A | Discharge status: Home / Self Care | Payer: Medicare PPO | Attending: Cardiology | Admitting: Cardiology

## 2021-10-27 ENCOUNTER — Ambulatory Visit: Payer: Medicare PPO | Admitting: Cardiology

## 2021-10-27 VITALS — BP 112/80 | HR 80 | Ht 74.0 in | Wt 262.0 lb

## 2021-10-27 DIAGNOSIS — I429 Cardiomyopathy, unspecified: Secondary | ICD-10-CM

## 2021-10-27 DIAGNOSIS — I4819 Other persistent atrial fibrillation: Secondary | ICD-10-CM | POA: Diagnosis not present

## 2021-10-27 DIAGNOSIS — E0590 Thyrotoxicosis, unspecified without thyrotoxic crisis or storm: Secondary | ICD-10-CM | POA: Diagnosis not present

## 2021-10-27 LAB — CBC
HCT: 48.1 % (ref 39.0–52.0)
Hemoglobin: 15.9 g/dL (ref 13.0–17.0)
MCH: 31.7 pg (ref 26.0–34.0)
MCHC: 33.1 g/dL (ref 30.0–36.0)
MCV: 96 fL (ref 80.0–100.0)
Platelets: 217 10*3/uL (ref 150–400)
RBC: 5.01 MIL/uL (ref 4.22–5.81)
RDW: 15.6 % — ABNORMAL HIGH (ref 11.5–15.5)
WBC: 8.5 10*3/uL (ref 4.0–10.5)
nRBC: 0 % (ref 0.0–0.2)

## 2021-10-27 LAB — BASIC METABOLIC PANEL
Anion gap: 9 (ref 5–15)
BUN: 13 mg/dL (ref 8–23)
CO2: 25 mmol/L (ref 22–32)
Calcium: 9.6 mg/dL (ref 8.9–10.3)
Chloride: 106 mmol/L (ref 98–111)
Creatinine, Ser: 0.92 mg/dL (ref 0.61–1.24)
GFR, Estimated: >60 mL/min (ref >60)
Glucose, Bld: 90 mg/dL (ref 70–99)
Potassium: 4.7 mmol/L (ref 3.5–5.1)
Sodium: 140 mmol/L (ref 135–145)

## 2021-10-27 NOTE — H&P (View-Only) (Signed)
Cardiology Office Note:    Date:  10/27/2021   ID:  JAY HASKEW, DOB May 11, 1956, MRN [id number]  PCP:  [name], MD  Cardiologist:  [name], MD  Electrophysiologist:  [name], MD   Referring MD: [name], MD   Chief Complaint  Patient presents with   Other    Follow up appt -- patient cancelled cardioversion due to not having a ride. Patient c.o SOB. Meds reviewed verbally with patient.    History of Present Illness:    Dustin Gray is a 66 y.o. male with a hx of atrial fibrillation, multiple sclerosis, lymphedema, hypothyroidism/Graves' disease, who presents for follow-up.    Patient being seen for atrial fibrillation.  Heart rates are typically better controlled when thyroid function is adequately managed.  Previously seen for persistent atrial fibrillation, cardioversion was planned but patient canceled due to socioeconomic factors.  His wife had a drug-related problem, apparently was arrested and currently in jail.  He states his stress levels are much improved, would like to schedule cardioversion.    He is tolerating Eliquis without any adverse effects or bleeding issues.  Compliant with Inderal and Cardizem as prescribed.   Prior notes Patient originally seen in the hospital for atrial fibrillation with rapid ventricular response.  He was managed with diltiazem and Eliquis.  He was subsequently diagnosed with hyperthyroidism and propanolol ER added to his medical regimen.   Echocardiogram on 09/2019 showed normal ejection fraction with EF 55 to 60%, left atrial size was also normal.   He was started on anticoagulation due to plan for cardioversion.  Cardioversion was attempted but unsuccessful.  His heart rates have improved when hyperthyroidism is controlled.  Past Medical History:  Diagnosis Date   Abscess    groin   Arthritis    lower spine   Erectile dysfunction    Low testosterone    Lumbar herniated disc    L5   Multiple  sclerosis (HCC)    Staph aureus infection     Past Surgical History:  Procedure Laterality Date   COLONOSCOPY WITH PROPOFOL N/A 07/04/2016   Procedure: COLONOSCOPY WITH PROPOFOL;  Surgeon: [name], MD;  Location: [place];  Service: Endoscopy;  Laterality: N/A;   [place]   POLYPECTOMY  07/04/2016   Procedure: POLYPECTOMY;  Surgeon: [name], MD;  Location: [facility];  Service: Endoscopy;;   TONSILLECTOMY      Current Medications: Current Meds  Medication Sig   apixaban (ELIQUIS) 5 MG TABS tablet Take 1 tablet (5 mg total) by mouth 2 (two) times daily.   baclofen (LIORESAL) 10 MG tablet Take 10 mg by mouth 3 (three) times daily.   diltiazem (CARDIZEM SR) 120 MG 12 hr capsule Take 1 capsule (120 mg total) by mouth 2 (two) times daily.   Dimethyl Fumarate 240 MG CPDR Take 240 mg by mouth 2 (two) times daily.    furosemide (LASIX) 40 MG tablet Take 1 tablet (40 mg total) by mouth as needed. With your potassium.   gabapentin (NEURONTIN) 300 MG capsule Take 300-600 mg by mouth See admin instructions. Take 1 capsule (300 mg) by mouth in the morning, take 1 capsule (300 mg) by mouth at midday, & take 2 capsules (600 mg) by mouth at bedtime.   meloxicam (MOBIC) 15 MG tablet Take 15 mg by mouth daily as needed for pain.    methimazole (TAPAZOLE) 10 MG tablet Take 10 mg  by mouth in the morning.   potassium chloride (KLOR-CON) 10 MEQ tablet Take 1 tablet (10 mEq total) by mouth as needed. Take with your Lasix.   propranolol ER (INDERAL LA) 80 MG 24 hr capsule Take 1 capsule (80 mg total) by mouth 2 (two) times daily.     Allergies:   Cephalexin, Ancef [cefazolin sodium], and Cefadroxil   Social History   Socioeconomic History   Marital status: Legally Separated    Spouse name: Not on file   Number of children: Not on file   Years of education: Not on file   Highest education level: Not on file  Occupational History   Not on  file  Tobacco Use   Smoking status: Former   Smokeless tobacco: Never   Tobacco comments:    smoked as teenager  Substance and Sexual Activity   Alcohol use: Yes    Comment: 2 drinks/month   Drug use: No   Sexual activity: Not on file  Other Topics Concern   Not on file  Social History Narrative   Not on file   Social Determinants of Health   Financial Resource Strain: Not on file  Food Insecurity: Not on file  Transportation Needs: Not on file  Physical Activity: Not on file  Stress: Not on file  Social Connections: Not on file     Family History: The patient's family history includes Diabetes in his father.  ROS:   Please see the history of present illness.     All other systems reviewed and are negative.  EKGs/Labs/Other Studies Reviewed:    The following studies were reviewed today:   EKG:  EKG is  ordered today.  The ekg ordered today demonstrates atrial fibrillation  heart rate 83  Recent Labs: 09/18/2021: BUN 13; Creatinine, Ser 0.93; Hemoglobin 14.8; Platelets 208; Potassium 4.5; Sodium 145  Recent Lipid Panel    Component Value Date/Time   CHOL 108 10/01/2019 0358   TRIG 81 10/01/2019 0358   HDL 35 (L) 10/01/2019 0358   CHOLHDL 3.1 10/01/2019 0358   VLDL 16 10/01/2019 0358   LDLCALC 57 10/01/2019 0358    Physical Exam:    VS:  BP 112/80 (BP Location: Left Arm, Patient Position: Sitting, Cuff Size: Normal)   Pulse 80   Ht '6\' 2"'$  (1.88 m)   Wt 262 lb (118.8 kg)   SpO2 97%   BMI 33.64 kg/m     Wt Readings from Last 3 Encounters:  10/27/21 262 lb (118.8 kg)  09/04/21 254 lb (115.2 kg)  08/16/21 250 lb (113.4 kg)     GEN:  Well nourished, well developed in no acute distress HEENT: Normal NECK: No JVD; No carotid bruits LYMPHATICS: No lymphadenopathy CARDIAC: Irregularly irregular RESPIRATORY:  Clear to auscultation without rales, wheezing or rhonchi  ABDOMEN: Soft, non-tender, non-distended MUSCULOSKELETAL:  no edema; No deformity  SKIN:  Warm and dry NEUROLOGIC:  Alert and oriented x 3 PSYCHIATRIC:  Normal affect   ASSESSMENT:    1. Persistent atrial fibrillation (HCC)   2. Cardiomyopathy, unspecified type ([place])   3. Hyperthyroidism      PLAN:    In order of problems listed above:  Persistent atrial fibrillation.  Last echo EF 40 to 45%.  Reduced from prior.  EKG today shows atrial fibrillation, heart rate 80 . Continue Cardizem SR 120 twice daily, continue Inderal, continue Eliquis 5 mg twice daily.   CHA2DS2-VASc score 0.  Plan DC cardioversion  Cardiomyopathy EF 40 to 45%.  Newly reduced from prior.  Likely tachycardia/A-fib induced.  Restoration of sinus rhythm will likely help with improving EF.  Continue Cardizem and Inderal for now, if EF stays low despite restoration of sinus rhythm, will consider adjusting AV nodal agents. hyperthyroidism.  On methimazole. management as per endocrinology.  Follow-up in 6 weeks  Total encounter time 40 minutes  Greater than 50% was spent in counseling and coordination of care with the patient  Shared Decision Making/Informed Consent The risks (stroke, cardiac arrhythmias rarely resulting in the need for a temporary or permanent pacemaker, skin irritation or burns and complications associated with conscious sedation including aspiration, arrhythmia, respiratory failure and death), benefits (restoration of normal sinus rhythm) and alternatives of a direct current cardioversion were explained in detail to Dustin Gray and he agrees to proceed.        Medication Adjustments/Labs and Tests Ordered: Current medicines are reviewed at length with the patient today.  Concerns regarding medicines are outlined above.  Orders Placed This Encounter  Procedures   CBC   Basic metabolic panel   EKG 67-HALP     No orders of the defined types were placed in this encounter.     Patient Instructions  Medication Instructions:   Your physician recommends that you continue on your  current medications as directed. Please refer to the Current Medication list given to you today.  *If you need a refill on your cardiac medications before your next appointment, please call your pharmacy*   Lab Work:  Please go to the medical mall after your appointment today for a lab draw (BMP, CBC)   Testing/Procedures:  You are scheduled for a Cardioversion on ___Wednesday 5/31/23__ with Dr._Agbor-Etang__________ Please arrive at the [place] of [facility] at _0630__ a.m. on the day of your procedure.  DIET INSTRUCTIONS:  Nothing to eat or drink after midnight except your medications with a sip of water.   Labs: _Drawn at time of office visit_________________  Medications:  YOU MAY TAKE ALL of your remaining medications with a small amount of water.  Must have a responsible person to drive you home.  Bring a current list of your medications and current insurance cards.    If you have any questions after you get home, please call the office at [phone]    Follow-Up: At [facility], you and your health needs are our priority.  As part of our continuing mission to provide you with exceptional heart care, we have created designated Provider Care Teams.  These Care Teams include your primary Cardiologist (physician) and Advanced Practice Providers (APPs -  Physician Assistants and Nurse Practitioners) who all work together to provide you with the care you need, when you need it.  We recommend signing up for the patient portal called "MyChart".  Sign up information is provided on this After Visit Summary.  MyChart is used to connect with patients for Virtual Visits (Telemedicine).  Patients are able to view lab/test results, encounter notes, upcoming appointments, etc.  Non-urgent messages can be sent to your provider as well.   To learn more about what you can do with MyChart, go to [email or web address].    Your next appointment:   6 week(s)  The format for your next  appointment:   In Person  Provider:    ONLY WITH [name], MD    Other Instructions   Important Information About Sugar         Signed, [name], MD  10/27/2021 11:50 AM  [facility]

## 2021-10-27 NOTE — Progress Notes (Signed)
?Cardiology Office Note:   ? ?Date:  10/27/2021  ? ?ID:  Dustin DELAHOUSSAYE, DOB 17-Mar-1956, MRN [id number] ? ?PCP:  Dustin Arabian, MD  ?Cardiologist:  Dustin Sable, MD  ?Electrophysiologist:  Dustin Epley, MD  ? ?Referring MD: Dustin Arabian, MD  ? ?Chief Complaint  ?Patient presents with  ? Other  ?  Follow up appt -- patient cancelled cardioversion due to not having a ride. Patient c.o SOB. Meds reviewed verbally with patient.  ? ? ?History of Present Illness:   ? ?Dustin Gray is a 66 y.o. male with a hx of atrial fibrillation, multiple sclerosis, lymphedema, hypothyroidism/Graves' disease, who presents for follow-up.   ? ?Patient being seen for atrial fibrillation.  Heart rates are typically better controlled when thyroid function is adequately managed.  Previously seen for persistent atrial fibrillation, cardioversion was planned but patient canceled due to socioeconomic factors.  His wife had a drug-related problem, apparently was arrested and currently in jail.  He states his stress levels are much improved, would like to schedule cardioversion.   ? ?He is tolerating Eliquis without any adverse effects or bleeding issues.  Compliant with Inderal and Cardizem as prescribed. ? ? ?Prior notes ?Patient originally seen in the hospital for atrial fibrillation with rapid ventricular response.  He was managed with diltiazem and Eliquis.  He was subsequently diagnosed with hyperthyroidism and propanolol ER added to his medical regimen.   ?Echocardiogram on 09/2019 showed normal ejection fraction with EF 55 to 60%, left atrial size was also normal.   ?He was started on anticoagulation due to plan for cardioversion.  Cardioversion was attempted but unsuccessful.  His heart rates have improved when hyperthyroidism is controlled. ? ?Past Medical History:  ?Diagnosis Date  ? Abscess   ? groin  ? Arthritis   ? lower spine  ? Erectile dysfunction   ? Low testosterone   ? Lumbar herniated disc   ? L5  ? Multiple  sclerosis ([place])   ? Staph aureus infection   ? ? ?Past Surgical History:  ?Procedure Laterality Date  ? COLONOSCOPY WITH PROPOFOL N/A 07/04/2016  ? Procedure: COLONOSCOPY WITH PROPOFOL;  Surgeon: Dustin Lame, MD;  Location: [place];  Service: Endoscopy;  Laterality: N/A;  ? [place]  ? MASS EXCISION  1960  ? POLYPECTOMY  07/04/2016  ? Procedure: POLYPECTOMY;  Surgeon: Dustin Lame, MD;  Location: [place];  Service: Endoscopy;;  ? TONSILLECTOMY    ? ? ?Current Medications: ?Current Meds  ?Medication Sig  ? apixaban (ELIQUIS) 5 MG TABS tablet Take 1 tablet (5 mg total) by mouth 2 (two) times daily.  ? baclofen (LIORESAL) 10 MG tablet Take 10 mg by mouth 3 (three) times daily.  ? diltiazem (CARDIZEM SR) 120 MG 12 hr capsule Take 1 capsule (120 mg total) by mouth 2 (two) times daily.  ? Dimethyl Fumarate 240 MG CPDR Take 240 mg by mouth 2 (two) times daily.   ? furosemide (LASIX) 40 MG tablet Take 1 tablet (40 mg total) by mouth as needed. With your potassium.  ? gabapentin (NEURONTIN) 300 MG capsule Take 300-600 mg by mouth See admin instructions. Take 1 capsule (300 mg) by mouth in the morning, take 1 capsule (300 mg) by mouth at midday, & take 2 capsules (600 mg) by mouth at bedtime.  ? meloxicam (MOBIC) 15 MG tablet Take 15 mg by mouth daily as needed for pain.   ? methimazole (TAPAZOLE) 10 MG tablet Take 10 mg  by mouth in the morning.  ? potassium chloride (KLOR-CON) 10 MEQ tablet Take 1 tablet (10 mEq total) by mouth as needed. Take with your Lasix.  ? propranolol ER (INDERAL LA) 80 MG 24 hr capsule Take 1 capsule (80 mg total) by mouth 2 (two) times daily.  ?  ? ?Allergies:   Cephalexin, Ancef [cefazolin sodium], and Cefadroxil  ? ?Social History  ? ?Socioeconomic History  ? Marital status: Legally Separated  ?  Spouse name: Not on file  ? Number of children: Not on file  ? Years of education: Not on file  ? Highest education level: Not on file  ?Occupational History  ? Not on  file  ?Tobacco Use  ? Smoking status: Former  ? Smokeless tobacco: Never  ? Tobacco comments:  ?  smoked as teenager  ?Substance and Sexual Activity  ? Alcohol use: Yes  ?  Comment: 2 drinks/month  ? Drug use: No  ? Sexual activity: Not on file  ?Other Topics Concern  ? Not on file  ?Social History Narrative  ? Not on file  ? ?Social Determinants of Health  ? ?Financial Resource Strain: Not on file  ?Food Insecurity: Not on file  ?Transportation Needs: Not on file  ?Physical Activity: Not on file  ?Stress: Not on file  ?Social Connections: Not on file  ?  ? ?Family History: ?The patient's family history includes Diabetes in his father. ? ?ROS:   ?Please see the history of present illness.    ? All other systems reviewed and are negative. ? ?EKGs/Labs/Other Studies Reviewed:   ? ?The following studies were reviewed today: ? ? ?EKG:  EKG is  ordered today.  The ekg ordered today demonstrates atrial fibrillation  heart rate 83 ? ?Recent Labs: ?09/18/2021: BUN 13; Creatinine, Ser 0.93; Hemoglobin 14.8; Platelets 208; Potassium 4.5; Sodium 145  ?Recent Lipid Panel ?   ?Component Value Date/Time  ? CHOL 108 10/01/2019 0358  ? TRIG 81 10/01/2019 0358  ? HDL 35 (L) 10/01/2019 0358  ? CHOLHDL 3.1 10/01/2019 0358  ? VLDL 16 10/01/2019 0358  ? [place] 57 10/01/2019 0358  ? ? ?Physical Exam:   ? ?VS:  BP 112/80 (BP Location: Left Arm, Patient Position: Sitting, Cuff Size: Normal)   Pulse 80   Ht '6\' 2"'$  (1.88 m)   Wt 262 lb (118.8 kg)   SpO2 97%   BMI 33.64 kg/m?    ? ?Wt Readings from Last 3 Encounters:  ?10/27/21 262 lb (118.8 kg)  ?09/04/21 254 lb (115.2 kg)  ?08/16/21 250 lb (113.4 kg)  ?  ? ?GEN:  Well nourished, well developed in no acute distress ?HEENT: Normal ?NECK: No JVD; No carotid bruits ?LYMPHATICS: No lymphadenopathy ?CARDIAC: Irregularly irregular ?RESPIRATORY:  Clear to auscultation without rales, wheezing or rhonchi  ?ABDOMEN: Soft, non-tender, non-distended ?MUSCULOSKELETAL:  no edema; No deformity  ?SKIN:  Warm and dry ?NEUROLOGIC:  Alert and oriented x 3 ?PSYCHIATRIC:  Normal affect  ? ?ASSESSMENT:   ? ?1. Persistent atrial fibrillation ([place])   ?2. Cardiomyopathy, unspecified type ([place])   ?3. Hyperthyroidism   ? ? ? ?PLAN:   ? ?In order of problems listed above: ? ?Persistent atrial fibrillation.  Last echo EF 40 to 45%.  Reduced from prior.  EKG today shows atrial fibrillation, heart rate 80 . Continue Cardizem SR 120 twice daily, continue Inderal, continue Eliquis 5 mg twice daily.   CHA2DS2-VASc score 0.  Plan DC cardioversion  ?Cardiomyopathy EF 40 to 45%.  Newly reduced from prior.  Likely tachycardia/A-fib induced.  Restoration of sinus rhythm will likely help with improving EF.  Continue Cardizem and Inderal for now, if EF stays low despite restoration of sinus rhythm, will consider adjusting AV nodal agents. ?hyperthyroidism.  On methimazole. management as per endocrinology. ? ?Follow-up in 6 weeks ? ?Total encounter time 40 minutes ? Greater than 50% was spent in counseling and coordination of care with the patient ? ?Shared Decision Making/Informed Consent ?The risks (stroke, cardiac arrhythmias rarely resulting in the need for a temporary or permanent pacemaker, skin irritation or burns and complications associated with conscious sedation including aspiration, arrhythmia, respiratory failure and death), benefits (restoration of normal sinus rhythm) and alternatives of a direct current cardioversion were explained in detail to Mr. Swicegood and he agrees to proceed.  ?  ? ?  ? ?Medication Adjustments/Labs and Tests Ordered: ?Current medicines are reviewed at length with the patient today.  Concerns regarding medicines are outlined above.  ?Orders Placed This Encounter  ?Procedures  ? CBC  ? Basic metabolic panel  ? EKG 12-Lead  ? ? ? ?No orders of the defined types were placed in this encounter. ? ? ? ? ?Patient Instructions  ?Medication Instructions:  ? ?Your physician recommends that you continue on your  current medications as directed. Please refer to the Current Medication list given to you today. ? ?*If you need a refill on your cardiac medications before your next appointment, please call your pharmacy* ? ? ?

## 2021-10-27 NOTE — Patient Instructions (Addendum)
Medication Instructions:  ? ?Your physician recommends that you continue on your current medications as directed. Please refer to the Current Medication list given to you today. ? ?*If you need a refill on your cardiac medications before your next appointment, please call your pharmacy* ? ? ?Lab Work: ? ?Please go to the medical mall after your appointment today for a lab draw (BMP, CBC) ? ? ?Testing/Procedures: ? ?You are scheduled for a Cardioversion on ___Wednesday 5/31/23__ with Dr._Agbor-Etang__________ ?Please arrive at the [place] [facility] at _0630__ a.m. on the day of your procedure. ? ?DIET INSTRUCTIONS:  ?Nothing to eat or drink after midnight except your medications with a sip of water. ? ? ?Labs: _Drawn at time of office visit_________________ ? ?Medications:  YOU MAY TAKE ALL of your remaining medications with a small amount of water. ? ?Must have a responsible person to drive you home. ? ?Bring a current list of your medications and current insurance cards.  ? ? ?If you have any questions after you get home, please call the office at [phone]  ? ? ?Follow-Up: ?At [facility], you and your health needs are our priority.  As part of our continuing mission to provide you with exceptional heart care, we have created designated Provider Care Teams.  These Care Teams include your primary Cardiologist (physician) and Advanced Practice Providers (APPs -  Physician Assistants and Nurse Practitioners) who all work together to provide you with the care you need, when you need it. ? ?We recommend signing up for the patient portal called "MyChart".  Sign up information is provided on this After Visit Summary.  MyChart is used to connect with patients for Virtual Visits (Telemedicine).  Patients are able to view lab/test results, encounter notes, upcoming appointments, etc.  Non-urgent messages can be sent to your provider as well.   ?To learn more about what you can do with MyChart, go to  [email or web address].   ? ?Your next appointment:   ?6 week(s) ? ?The format for your next appointment:   ?In Person ? ?Provider:   ? ?ONLY WITH ?[name], MD  ? ? ?Other Instructions ? ? ?Important Information About Sugar ? ? ? ? ? ? ?

## 2021-10-31 ENCOUNTER — Ambulatory Visit: Payer: Medicare PPO | Admitting: Occupational Therapy

## 2021-10-31 ENCOUNTER — Ambulatory Visit: Payer: Medicare PPO | Admitting: Physical Therapy

## 2021-10-31 ENCOUNTER — Encounter: Payer: Self-pay | Admitting: Physical Therapy

## 2021-10-31 DIAGNOSIS — R2689 Other abnormalities of gait and mobility: Secondary | ICD-10-CM | POA: Diagnosis not present

## 2021-10-31 DIAGNOSIS — R262 Difficulty in walking, not elsewhere classified: Secondary | ICD-10-CM

## 2021-10-31 DIAGNOSIS — R269 Unspecified abnormalities of gait and mobility: Secondary | ICD-10-CM

## 2021-10-31 DIAGNOSIS — M6281 Muscle weakness (generalized): Secondary | ICD-10-CM | POA: Diagnosis not present

## 2021-10-31 DIAGNOSIS — I890 Lymphedema, not elsewhere classified: Secondary | ICD-10-CM | POA: Diagnosis not present

## 2021-10-31 DIAGNOSIS — R278 Other lack of coordination: Secondary | ICD-10-CM | POA: Diagnosis not present

## 2021-10-31 DIAGNOSIS — R2681 Unsteadiness on feet: Secondary | ICD-10-CM | POA: Diagnosis not present

## 2021-10-31 NOTE — Therapy (Signed)
[place] ?[facility] ?[place], [place], [address] ?Phone: [phone]   Fax:  [phone] ? ?Physical Therapy Treatment ? ?Patient Details  ?Name: Dustin Gray ?MRN: [id number] ?Date of Birth: Jan 15, 1956 ?No data recorded ? ?Encounter Date: 10/31/2021 ? ? PT End of Session - 10/31/21 1602   ? ? Visit Number 49   ? Number of Visits 72   ? Date for PT Re-Evaluation 01/04/22   ? [place] [place] primary  Cert 8/34/19-12/15/27   ? Authorization Time Period 08/23/21-11/23/21 24 visit   ? Progress Note Due on Visit 60   ? PT Start Time 7989   ? PT Stop Time 2119   ? PT Time Calculation (min) 40 min   ? Equipment Utilized During Treatment Gait belt   ? Activity Tolerance Patient tolerated treatment well;No increased pain   ? Behavior During Therapy [facility] for tasks assessed/performed   ? ?  ?  ? ?  ? ? ?Past Medical History:  ?Diagnosis Date  ? Abscess   ? groin  ? Arthritis   ? lower spine  ? Erectile dysfunction   ? Low testosterone   ? Lumbar herniated disc   ? L5  ? Multiple sclerosis ([place])   ? Staph aureus infection   ? ? ?Past Surgical History:  ?Procedure Laterality Date  ? COLONOSCOPY WITH PROPOFOL N/A 07/04/2016  ? Procedure: COLONOSCOPY WITH PROPOFOL;  Surgeon: [name], MD;  Location: [place];  Service: Endoscopy;  Laterality: N/A;  ? [place]  ? MASS EXCISION  1960  ? POLYPECTOMY  07/04/2016  ? Procedure: POLYPECTOMY;  Surgeon: [name], MD;  Location: [place];  Service: Endoscopy;;  ? TONSILLECTOMY    ? ? ?There were no vitals filed for this visit. ? ? Subjective Assessment - 11/01/21 0824   ? ? Subjective Patient reports doing well overall.  Patient reports he is nearing the end of his occupational therapy sessions.  Patient does report some concern with ending physical therapy activities due to continued weakness from progressive neuromuscular disease.   ? Pertinent History Patient presents  to physical therapy for unsteadiness, walking instability, fatigue. His PMH includes paroxysmal a fib, MS, lymphedema, hypothyroidism, Graves Disease, arthritis, lumbar herniated disc (L5), depression and neuropathy. Patient first developed symptoms of MS in 2005 and was diagnosed in 2006. Patient has done PT in the past, but hasn't been seen since 2020 at [facility]. Has not been doing his exercises in the past year. Walk outside to garage to ride lawnmower, walks in house. 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 Limitations Standing;Lifting;Walking;House hold activities   ? How long can you sit comfortably? a couple hours   ? How long can you stand comfortably? no more than 10 minutes before pain, have to steady self immediately   ? How long can you walk comfortably? has to use rollator. can walk in a store with a cart   ? Patient Stated Goals want to get stronger and feel mor comfortable walking around. Patient would like to continue strengthening hips and improve endurance and ability to pace self for safety while walking.   ? Pain Onset Yesterday   ? ?  ?  ? ?  ? ? ?Interventions:  ? ?STS with R LE on airex x 10  ?- difficulty with first few reps but improved with reps  ? ?  Side step to left to airex pad and back  ?Start by standing with B feet on 2 x airex pad stacked and then side step off pad and perform  squat x 15 reps to left only to work on L LE foot clearance and L LE strength  ? ?Step up onto airex pad 1 LE and then opp LE dynamic high knee march in // bars with minimal UE support x 20 reps each.  ? ?Step to an from airex pad anteriorly x 10 ea, UE assist on post stepping, no UE assist on anterior stepping ? ?Core balance machine games and weight shifting activities to improve weightbearing bilaterally particularly with left lower extremity.  Performed with full dynamic and static activities.  Patient tolerated well but did have some fatigue  following. ? ?Physical therapist assisted patient to transport chair and brought patient from therapy gym to parking lot post session. ? ?Education provided throughout session via VC/TC and demonstration to facilitate movement at target joints and correct muscle activation for all testing and exercises performed.  ? ? ? ? ? ? ? ? ? ? ? ? ? ? ? ? ? ? ? ? ? ? ? ? ? ? ? ? ? ? PT Education - 11/01/21 0825   ? ? Education Details Exercise technique   ? Person(s) Educated Patient   ? Methods Explanation   ? Comprehension Verbalized understanding   ? ?  ?  ? ?  ? ? ? PT Short Term Goals - 08/22/21 1358   ? ?  ? PT SHORT TERM GOAL #1  ? Title Patient will be independent in home exercise program to improve strength/mobility for better functional independence with ADLs.   ? Baseline 8/9: HEP to be given next session, 10/31: patient reports compliance with HEP and would like progressions added next session. 08/22/2021=Patient verbalized that he is walking and doing his LE exercises with no questions at this time.   ? Time 4   ? Period Weeks   ? Status Achieved   ? Target Date 08/16/21   ? ?  ?  ? ?  ? ? ? ? PT Long Term Goals - 10/12/21 1410   ? ?  ? PT LONG TERM GOAL #1  ? Title Patient will increase FOTO score to equal to or greater than 70%    to demonstrate statistically significant improvement in mobility and quality of life.   ? Baseline 8/9: 64%; risk adjusted 34%; 03/21/21 FOTO: 45; 04/24/21 FOTO: 49% ; 08/22/2021= 56% 4/20: 60%   ? Time 12   ? Period Weeks   ? Status Partially Met   ? Target Date 01/04/22   ?  ? PT LONG TERM GOAL #2  ? Title Patient will increase Berg Balance score by > 6 points to demonstrate decreased fall risk during functional activities.   ? Baseline 4/20: 43/56   ? Time 12   ? Period Weeks   ? Status New   ? Target Date 01/04/22   ?  ? PT LONG TERM GOAL #3  ? Title Patient will increase 10 meter walk test to >1.82ms as to improve gait speed for better community ambulation and to reduce fall risk.    ? Baseline 8/9: 0.65 m/s with rollator; 10/31: 0.68 m/s with rollator. 07/06/2021= 0.71 m/s using rollator; 08/22/2021= 0.94 m/s using rollator. 4/20: 1.1 m/s with rollator   ? Time 12   ? Period Weeks   ? Status Achieved   ?  Target Date 10/11/21   ?  ? PT LONG TERM GOAL #4  ? Title Patient will increase six minute walk test distance to >1000 for progression to community ambulator and improve gait ability   ? Baseline 8/9: 505 ft with rollator; 03/21/21: 720f c rollator, 04/24/21: 7527fc rollator; 07/06/2021=900 feet; 08/22/2021= 935 feet with use of Rollator 44/20: 940 ft   ? Time 12   ? Period Weeks   ? Status Partially Met   ? Target Date 01/04/22   ?  ? PT LONG TERM GOAL #5  ? Title Patient will increase glute medius strength on Left LE from 3-/5 to 4/5 to improve stability, gait mechanics, and functional strength.   ? Baseline 10/31: 3-/5; 07/06/2021= 3-/5; 08/22/2021=3-/5 unable to achieve full ROM yet able to withstand some resistance with testing 4/20: unable to obtainfull ROM against gravity   ? Time 12   ? Period Weeks   ? Status On-going   ? Target Date 01/04/22   ?  ? Additional Long Term Goals  ? Additional Long Term Goals Yes   ?  ? PT LONG TERM GOAL #6  ? Title Patient will increase Left single leg stance time to 15 seconds or greater to increase safety in shower and independence with ADLs.   ? Baseline 10/31; 1.83 sec without UE support; 08/22/2021=2-3  sec  on left; 6 sec on right 4/20: RLE 10 seconds LLE unable to perform   ? Time 12   ? Period Weeks   ? Status On-going   ? Target Date 01/04/22   ?  ? PT LONG TERM GOAL #7  ? Title Patient will safely negotiate 13 steps with handrails to safely enter/exit mothers house.   ? Baseline 4/20: very challenging   ? Time 12   ? Period Weeks   ? Status New   ? Target Date 01/04/22   ? ?  ?  ? ?  ? ? ? ? ? ? ? ? Plan - 10/31/21 1604   ? ? Clinical Impression Statement Patient presents with good motivation for completion of physical therapy activities.  Patient  continues to be challenged with lower extremity standing, mobility, left lower extremity strengthening, as well as neuromuscular control and balance exercises.  Patient introduced to core balance machine in order to i

## 2021-10-31 NOTE — Therapy (Signed)
[place] ?[facility] ?[place], [place], [address] ?Phone: [phone]   Fax:  [phone] ? ?Occupational Therapy Treatment ? ?Patient Details  ?Name: Dustin Gray ?MRN: [id number] ?Date of Birth: 1956/01/13 ?Referring Provider (OT): Staci Acosta, MD ? ? ?Encounter Date: 10/31/2021 ? ? OT End of Session - 10/31/21 1548   ? ? Visit Number 23   ? Number of Visits 36   ? Date for OT Re-Evaluation 01/03/22   ? OT Start Time [phone]   ? OT Stop Time 0345   ? OT Time Calculation (min) 40 min   ? Activity Tolerance Patient tolerated treatment well;No increased pain   ? Behavior During Therapy [facility] for tasks assessed/performed   ? ?  ?  ? ?  ? ? ?Past Medical History:  ?Diagnosis Date  ? Abscess   ? groin  ? Arthritis   ? lower spine  ? Erectile dysfunction   ? Low testosterone   ? Lumbar herniated disc   ? L5  ? Multiple sclerosis ([place])   ? Staph aureus infection   ? ? ?Past Surgical History:  ?Procedure Laterality Date  ? COLONOSCOPY WITH PROPOFOL N/A 07/04/2016  ? Procedure: COLONOSCOPY WITH PROPOFOL;  Surgeon: [name], MD;  Location: [place];  Service: Endoscopy;  Laterality: N/A;  ? [place]  ? MASS EXCISION  1960  ? POLYPECTOMY  07/04/2016  ? Procedure: POLYPECTOMY;  Surgeon: [name], MD;  Location: [place];  Service: Endoscopy;;  ? TONSILLECTOMY    ? ? ?There were no vitals filed for this visit. ? ? Subjective Assessment - 10/31/21 1554   ? ? Subjective  Mr. Dustin Gray presents for OT visit 23/36 to address BLE lymphedema. Pt denies LE related leg pain. Pt  presents with custom compression stocking in place on the L and multilayer wraps on t the RLE .Custom compression garments are available for fitting today.   ? Pertinent History relevant to LE: HTN, MS, Herniated C-5-C6, L5-S1, chronic low back pain, persistent Afib,Hx LLE cellulitis, Graves disease, HYPERthyroidism, OA   ? Limitations difficulty walking, impaired  functional mobility and transfers, impaired balance, muscle weakness, L>R,, altered sensation, chronic back pain , chronic leg swelling and associated pain, spasticity in legs   ? Repetition Increases Symptoms   ? Special Tests +Stemmer sign, L>R; Intake FOTO: 51/100   ? Patient Stated Goals Learn about lymphedema and explore treatment options   ? Currently in Pain? No/denies   ? Pain Onset Yesterday   ? Pain Onset More than a month ago   ? ?  ?  ? ?  ? ? ? ? ? ? ? ? ? ? ? ? ? ? ? OT Treatments/Exercises (OP) - 10/31/21 1555   ? ?  ? ADLs  ? ADL Education Given Yes   ?  ? Manual Therapy  ? Manual Therapy Edema management;Compression Bandaging;Manual Lymphatic Drainage (MLD)   ? Manual Lymphatic Drainage (MLD) MLD to LLE/LLQ utilizing short neck sequence, diaphragmatic breathing, functional inguinal LN and proximal nto distal J strokes to each lower extremity segment. Fially repeat 3 retrograde sweeps to terminus and repeat short neck.   ? Compression Bandaging 4 layer knee length compression wrap , including foot , from base of toes to popliteal fossa using stockinett as base layer, then single layer Rosidal foam under 1 each 8,10 and 12 cm wide short stretch bandages.   ? ?  ?  ? ?  ? ? ? ? ? ? ? ? ?  OT Education - 10/31/21 1556   ? ? Education Details Reviewed custom compression wear regime and care guidelines.Good return.   ? Person(s) Educated Patient   ? Methods Explanation;Demonstration;Handout   ? Comprehension Verbalized understanding;Returned demonstration;Need further instruction   ? ?  ?  ? ?  ? ? ? ? ? ? OT Long Term Goals - 10/09/21 1610   ? ?  ? OT LONG TERM GOAL #1  ? Title Given this patient?s Intake score of 51/100 on the functional outcomes FOTO tool, patient will experience an increase in function of 5 points to improve basic and instrumental ADLs performance, including lymphedema self-care.   ? Baseline Max A   ? Time 12   ? Period Weeks   ? Status Partially Met   08/08/21 ( OT visit 9) increased  2 points tfrom 51/100 initially to 53100 today.  ? Target Date 01/07/22   ?  ? OT LONG TERM GOAL #2  ? Title Pt will be able to apply knee length, multi-layer, short stretch compression wraps to one limb at a time using gradient techniques with MAX CG ASSISTANCE to decrease limb volume, to limit infection risk, and to limit lymphedema progression.   ? Baseline Dependent   ? Time 4   ? Period Days   ? Status Achieved   Pt met and exceeded this goal as he is able to wrap independently  ?  ? OT LONG TERM GOAL #3  ? Title Pt will demonstrate understanding of lymphedema prevention strategies by identifying and discussing 5 precautions using printed reference (modified assistance) to reduce risk of progression and to limit infection risk.   ? Baseline Max A   ? Time 4   ? Period Days   ? Status Achieved   ? Target Date --   4th OT Rx visit  ?  ? OT LONG TERM GOAL #4  ? Title Pt will achieve at least a 10% limb volume reduction in bilateral legs to return limb to normal size and shape,  to limit lymphedema progression and to limit infection risk.   ? Baseline mAx   ? Time 12   ? Period Weeks   ? Status Partially Met   LLE reduced by 18.8 % in 10 visits. RLE TBA next bisit. By visual assessment appears to be reduced ~ 15% from initial.  ? Target Date 01/07/22   ?  ? OT LONG TERM GOAL #5  ? Title With MAX CG ASSISTANCE Pt will achieve and sustain a least 85% compliance with all 4 LE self-care home program components throughout Intensive Phase CDT, including modified simple self-MLD, daily skin inspection and care, lymphatic pumping the ex, 23/7 compression wraps to sustain clinical gains made in CDT and to limit lymphedema progression and further functional decline.   ? Baseline Dependent   ? Time 12   ? Period Weeks   ? Status Achieved   Met and exceeded goal. Pt modified independent (extra time) with all home program components and consistently 85% compliant w LE self care  ? ?  ?  ? ?  ? ? ? ? ? ? ? ? Plan - 10/31/21 1549    ? ? Clinical Impression Statement Completed remake fitting for LLE   garment. Length is corrected and silicone band reduced to 2.5 instead of 5 cm width.L stocking remake Fit is excellent.Completed initial RLE garment fitting. Fit appears to be A-OK! will see Pt in a couple of days to check functional containment  of  garment, comfort and donning/ doffing skills. If all good to go we'll follow up in 6-8 weeks and PRN.   ? OT Occupational Profile and History Comprehensive Assessment- Review of records and extensive additional review of physical, cognitive, psychosocial history related to current functional performance   ? Occupational performance deficits (Please refer to evaluation for details): ADL's;IADL's;Work;Leisure;Social Participation;Other   role performance  ? Body Structure / Function / Physical Skills ADL;Edema;Skin integrity;Flexibility;Pain;ROM;Decreased knowledge of use of DME;Scar mobility;IADL   ? Rehab Potential Good   ? Clinical Decision Making Several treatment options, min-mod task modification necessary   ? Comorbidities Affecting Occupational Performance: Presence of comorbidities impacting occupational performance   ? Modification or Assistance to Complete Evaluation  Min-Moderate modification of tasks or assist with assess necessary to complete eval   ? OT Frequency 2x / week   ? OT Duration 12 weeks   and PRN for follow along  ? OT Treatment/Interventions Self-care/ADL training;Therapeutic exercise;Manual Therapy;Coping strategies training;Therapeutic activities;Manual lymph drainage;DME and/or AE instruction;Compression bandaging;Other (comment);Patient/family education   skin care to limit infection risk  ? Plan Complete Decongestive Therapy (CDT) One leg at a time to limit fall LLE first. Manual lymphatic drainage (MLD), skin care, ther ex, compression wraps, then   ? OT Home Exercise Plan Pt verbalized understanding that he requires assistance with all lymphedema home care components,  especially compression wrapping, between visits for optimal prognosis. Without assistance prognosis is poor   ? Recommended Other Services In insurance benefits available, garmentsConsider advanced se

## 2021-11-02 ENCOUNTER — Ambulatory Visit: Payer: Medicare PPO | Admitting: Occupational Therapy

## 2021-11-02 ENCOUNTER — Telehealth: Payer: Self-pay | Admitting: Cardiology

## 2021-11-02 ENCOUNTER — Ambulatory Visit: Payer: Medicare PPO

## 2021-11-02 DIAGNOSIS — M6281 Muscle weakness (generalized): Secondary | ICD-10-CM | POA: Diagnosis not present

## 2021-11-02 DIAGNOSIS — I890 Lymphedema, not elsewhere classified: Secondary | ICD-10-CM | POA: Diagnosis not present

## 2021-11-02 DIAGNOSIS — R278 Other lack of coordination: Secondary | ICD-10-CM | POA: Diagnosis not present

## 2021-11-02 DIAGNOSIS — R269 Unspecified abnormalities of gait and mobility: Secondary | ICD-10-CM

## 2021-11-02 DIAGNOSIS — R262 Difficulty in walking, not elsewhere classified: Secondary | ICD-10-CM

## 2021-11-02 DIAGNOSIS — R2681 Unsteadiness on feet: Secondary | ICD-10-CM | POA: Diagnosis not present

## 2021-11-02 DIAGNOSIS — R2689 Other abnormalities of gait and mobility: Secondary | ICD-10-CM | POA: Diagnosis not present

## 2021-11-02 MED ORDER — APIXABAN 5 MG PO TABS: 5.0000 mg | ORAL_TABLET | Freq: Two times a day (BID) | ORAL | 1 refills | Status: DC

## 2021-11-02 NOTE — Therapy (Signed)
[facility] ?[place], [place], [address] ?Phone: [phone]   Fax:  [phone] ? ?Occupational Therapy Treatment ? ?Patient Details  ?Name: Dustin Gray ?MRN: [id number] ?Date of Birth: [birth year] ?Referring Provider (OT): Staci Acosta, MD ? ? ?Encounter Date: 11/02/2021 ? ? OT End of Session - 11/02/21 1025   ? ? Visit Number 17   ? Number of Visits 36   ? Date for OT Re-Evaluation 01/03/22   ? OT Start Time 1105   ? Activity Tolerance Patient tolerated treatment well;No increased pain   ? Behavior During Therapy [facility] for tasks assessed/performed   ? ?  ?  ? ?  ? ? ?Past Medical History:  ?Diagnosis Date  ? Abscess   ? groin  ? Arthritis   ? lower spine  ? Erectile dysfunction   ? Low testosterone   ? Lumbar herniated disc   ? L5  ? Multiple sclerosis ([place])   ? Staph aureus infection   ? ? ?Past Surgical History:  ?Procedure Laterality Date  ? COLONOSCOPY WITH PROPOFOL N/A 07/04/2016  ? Procedure: COLONOSCOPY WITH PROPOFOL;  Surgeon: [name], MD;  Location: [place];  Service: Endoscopy;  Laterality: N/A;  ? [place]  ? MASS EXCISION  1960  ? POLYPECTOMY  07/04/2016  ? Procedure: POLYPECTOMY;  Surgeon: [name], MD;  Location: [place];  Service: Endoscopy;;  ? TONSILLECTOMY    ? ? ?There were no vitals filed for this visit. ? ? Subjective Assessment - 11/02/21 1538   ? ? Subjective  Mr. Dustin Gray presents for OT toaddress BLE lymphedema. Pt denies LE related leg pain. Pt  presents with custom compression stockings in place bilaterally. He denies LE related leg pain.   ? Pertinent History relevant to LE: HTN, MS, Herniated C-5-C6, L5-S1, chronic low back pain, persistent Afib,Hx LLE cellulitis, Graves disease, HYPERthyroidism, OA   ? Limitations difficulty walking, impaired functional mobility and transfers, impaired balance, muscle weakness, L>R,, altered sensation, chronic back pain , chronic leg  swelling and associated pain, spasticity in legs   ? Repetition Increases Symptoms   ? Special Tests +Stemmer sign, L>R; Intake FOTO: 51/100   ? Patient Stated Goals Learn about lymphedema and explore treatment options   ? Currently in Pain? No/denies   ? Pain Onset Yesterday   ? Pain Onset More than a month ago   ? ?  ?  ? ?  ? ? ? ? ? ? ? ? ? ? ? ? ? ? ? ? ? ? ? ? ? ? ? OT Education - 11/02/21 1539   ? ? Education Details Continued Pt/ CG edu for lymphedema self care  and home program throughout session. Topics include multilayer, gradient compression wrapping, simple self-MLD, therapeutic lymphatic pumping exercises, skin/nail care, risk reduction factors and LE precautions, compression garments/recommendations and wear and care schedule and compression garment donning / doffing using assistive devices. All questions answered to the Pt's satisfaction, and Pt demonstrates understanding by report.   ? Person(s) Educated Patient   ? Methods Explanation;Demonstration;Handout   ? Comprehension Verbalized understanding;Returned demonstration;Need further instruction   ? ?  ?  ? ?  ? ? ? ? ? ? OT Long Term Goals - 11/02/21 1028   ? ?  ? OT LONG TERM GOAL #1  ? Title Given this patient?s Intake score of 51/100 on the functional outcomes FOTO tool, patient will experience an increase in  function of 5 points to improve basic and instrumental ADLs performance, including lymphedema self-care.   ? Baseline Max A   ? Time 12   ? Period Weeks   ? Status Achieved   08/08/21 ( OT visit 9) increased 2 points tfrom 51/100 initially to 53100 today. 11/02/21: Goal met with final score 77/100%, a 26 point increase. Pt is also simultaneously undergoing PT, whoich has helped lymphatic function immensely.  ? Target Date 01/07/22   ?  ? OT LONG TERM GOAL #2  ? Title Pt will be able to apply knee length, multi-layer, short stretch compression wraps to one limb at a time using gradient techniques with MAX CG ASSISTANCE to decrease limb  volume, to limit infection risk, and to limit lymphedema progression.   ? Baseline Dependent   ? Time 4   ? Period Days   ? Status Achieved   Pt met and exceeded this goal as he is able to wrap independently  ?  ? OT LONG TERM GOAL #3  ? Title Pt will demonstrate understanding of lymphedema prevention strategies by identifying and discussing 5 precautions using printed reference (modified assistance) to reduce risk of progression and to limit infection risk.   ? Baseline Max A   ? Time 4   ? Period Days   ? Status Achieved   ? Target Date --   4th OT Rx visit  ?  ? OT LONG TERM GOAL #4  ? Title Pt will achieve at least a 10% limb volume reduction in bilateral legs to return limb to normal size and shape,  to limit lymphedema progression and to limit infection risk.   ? Baseline mAx   ? Time 12   ? Period Weeks   ? Status Achieved   LLE volume reduced by 18.4%. RLE volume reduced by 11.7%.  ? Target Date 01/07/22   ?  ? OT LONG TERM GOAL #5  ? Title With MAX CG ASSISTANCE Pt will achieve and sustain a least 85% compliance with all 4 LE self-care home program components throughout Intensive Phase CDT, including modified simple self-MLD, daily skin inspection and care, lymphatic pumping the ex, 23/7 compression wraps to sustain clinical gains made in CDT and to limit lymphedema progression and further functional decline.   ? Baseline Dependent   ? Time 12   ? Period Weeks   ? Status Achieved   Met and exceeded goal. Pt modified independent (extra time) with all home program components and consistently 85% compliant w LE self care  ? ?  ?  ? ?  ? ? ? ? ? ? ? ? Plan - 11/02/21 1028   ? ? Clinical Impression Statement Final FOTO score for Intensive Phase CDT measures 77/100%, which is an increase of 16 points.Goal met and exceeded. Bilateral limb volumetrics reveal total limb volume reduction for the LLE  = 18.40%, and total volume reduction in volume on the RLE = 11.7%. Both values meet and exceed the 10% reduction goal.  Pt's custom garments fit, are comfortable and appropriately control limb swelling. Pt is able to don and doff independently. Goal met. (DME vendor faxed fitting sheet after session.) Pt transitions today from Intensive to Self-Management Phase of CDT. He agrees with plan to return in 3 months for follow along PRN. No further OT is indicated at present time.See LONG TERM GOALS section for final progress report.   ? OT Occupational Profile and History Comprehensive Assessment- Review of records and extensive additional  review of physical, cognitive, psychosocial history related to current functional performance   ? Occupational performance deficits (Please refer to evaluation for details): ADL's;IADL's;Work;Leisure;Social Participation;Other   role performance  ? Body Structure / Function / Physical Skills ADL;Edema;Skin integrity;Flexibility;Pain;ROM;Decreased knowledge of use of DME;Scar mobility;IADL   ? Rehab Potential Good   ? Clinical Decision Making Several treatment options, min-mod task modification necessary   ? Comorbidities Affecting Occupational Performance: Presence of comorbidities impacting occupational performance   ? Modification or Assistance to Complete Evaluation  Min-Moderate modification of tasks or assist with assess necessary to complete eval   ? OT Frequency 2x / week   ? OT Duration 12 weeks   and PRN for follow along  ? OT Treatment/Interventions Self-care/ADL training;Therapeutic exercise;Manual Therapy;Coping strategies training;Therapeutic activities;Manual lymph drainage;DME and/or AE instruction;Compression bandaging;Other (comment);Patient/family education   skin care to limit infection risk  ? Plan Complete Decongestive Therapy (CDT) One leg at a time to limit fall LLE first. Manual lymphatic drainage (MLD), skin care, ther ex, compression wraps, then   ? OT Home Exercise Plan Pt verbalized understanding that he requires assistance with all lymphedema home care components, especially  compression wrapping, between visits for optimal prognosis. Without assistance prognosis is poor   ? Recommended Other Services In insurance benefits available, garmentsConsider advanced sequential pneumatic

## 2021-11-02 NOTE — Telephone Encounter (Signed)
Prescription refill request for Eliquis received. ?Indication: Atrial Fib ?Last office visit: 10/27/21  Agbor-Etang MD ?Scr: 0.92 on 10/27/21 ?Age: 66 ?Weight: 118.8kg ? ?Based on above findings Eliquis '5mg'$  twice daily is the appropriate dose.  Refill approved. ? ?

## 2021-11-02 NOTE — Patient Instructions (Signed)

## 2021-11-02 NOTE — Telephone Encounter (Signed)
*  STAT* If patient is at the pharmacy, call can be transferred to refill team.   1. Which medications need to be refilled? (please list name of each medication and dose if known) apixaban (ELIQUIS) 5 MG TABS tablet  2. Which pharmacy/location (including street and city if local pharmacy) is medication to be sent to?[id number] - [place], [place] - [address]  3. Do they need a 30 day or 90 day supply? 90  

## 2021-11-02 NOTE — Telephone Encounter (Signed)
Please review

## 2021-11-02 NOTE — Therapy (Signed)
[facility] ?[place], [place], [address] ?Phone: [phone]   Fax:  [phone] ? ?Physical Therapy Treatment ? ?Patient Details  ?Name: Dustin Gray ?MRN: [id number] ?Date of Birth: May 12, 1956 ?No data recorded ? ?Encounter Date: 11/02/2021 ? ? PT End of Session - 11/02/21 1020   ? ? Visit Number 72   ? Number of Visits 72   ? Date for PT Re-Evaluation 01/04/22   ? [place] primary  Cert 1/74/94-4/96/75   ? Authorization Time Period 08/23/21-11/23/21 24 visit   ? Progress Note Due on Visit 60   ? PT Start Time 1105   ? PT Stop Time 1146   ? PT Time Calculation (min) 41 min   ? Equipment Utilized During Treatment Gait belt   ? Activity Tolerance Patient tolerated treatment well;No increased pain   ? Behavior During Therapy [facility] for tasks assessed/performed   ? ?  ?  ? ?  ? ? ?Past Medical History:  ?Diagnosis Date  ? Abscess   ? groin  ? Arthritis   ? lower spine  ? Erectile dysfunction   ? Low testosterone   ? Lumbar herniated disc   ? L5  ? Multiple sclerosis ([place])   ? Staph aureus infection   ? ? ?Past Surgical History:  ?Procedure Laterality Date  ? COLONOSCOPY WITH PROPOFOL N/A 07/04/2016  ? Procedure: COLONOSCOPY WITH PROPOFOL;  Surgeon: [name], MD;  Location: [place];  Service: Endoscopy;  Laterality: N/A;  ? [place]  ? MASS EXCISION  1960  ? POLYPECTOMY  07/04/2016  ? Procedure: POLYPECTOMY;  Surgeon: [name], MD;  Location: [place] [place];  Service: Endoscopy;;  ? TONSILLECTOMY    ? ? ?There were no vitals filed for this visit. ? ? Subjective Assessment - 11/02/21 1109   ? ? Subjective Patient reports compliance with HEP. Is discharged from lypmhedema therapy today.   ? Pertinent History Patient presents to physical therapy for unsteadiness, walking instability, fatigue. His PMH includes paroxysmal a fib, MS, lymphedema, hypothyroidism, Graves Disease, arthritis, lumbar  herniated disc (L5), depression and neuropathy. Patient first developed symptoms of MS in 2005 and was diagnosed in 2006. Patient has done PT in the past, but hasn't been seen since 2020 at [facility]. Has not been doing his exercises in the past year. Walk outside to garage to ride lawnmower, walks in house. 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 Limitations Standing;Lifting;Walking;House hold activities   ? How long can you sit comfortably? a couple hours   ? How long can you stand comfortably? no more than 10 minutes before pain, have to steady self immediately   ? How long can you walk comfortably? has to use rollator. can walk in a store with a cart   ? Patient Stated Goals want to get stronger and feel mor comfortable walking around. Patient would like to continue strengthening hips and improve endurance and ability to pace self for safety while walking.   ? Currently in Pain? No/denies   ? ?  ?  ? ?  ? ? ? ? ? ? ? ? ? ?  ?  ?INTERVENTIONS:  ?  ?By support bar: CGA and use of gait belt unless otherwise stated. ?  ?Neuro re-ed ?  ?Lateral stepping on balance beam 6x length of // bars with UE support ?  ?  Tandem walking with BUE support on airex balance beam 6x length of // bars ? ?Balance beam airex pad: static stand PVC pipe: chest press10x, straight arm raise 10x  ? ?  ?Step over orange hurdle and back 10x each LE; BUE support; very challenging for LLE ? ?Lateral step over orange hurdle and back 10x each LE BUE support ?  ?  ?TherEx  ?  ?-Step ups 4"  green step with Min UE support on rails x 12 reps each leg ?  ?3lb ankle weights: ?-lateral stepping 6x length of // bars  ?-forward backwards walking 6x length of // bars ?-heel raises 15x ? ?  ?10x STS  ?GTB abduction 15x ?Adduction ball squeeze 10x 3 second ? ?Pt educated throughout session about proper posture and technique with exercises. Improved exercise technique, movement at target joints, use of target  muscles after min to mod verbal, visual, tactile cues. ?  ?  ? ? ? ?Patient presents with excellent motivation throughout physical therapy session. He has decreased episodes of L knee hyperextension with weightbearing. Ankle righting reactions on unstable surfaces is improving with decreased instability however he does require UE support for dynamic movement. Patient will continue to benefit from skilled physical therapy intervention in order to improve his lower extremity strength, stability, mobility, and quality of life. ? ? ? ? ? ? ? ? ? ? ? ? ? ? PT Education - 11/02/21 1020   ? ? Education Details exercise technique, body mechanics   ? Person(s) Educated Patient   ? Methods Explanation;Demonstration;Tactile cues;Verbal cues   ? Comprehension Verbalized understanding;Returned demonstration;Verbal cues required   ? ?  ?  ? ?  ? ? ? PT Short Term Goals - 08/22/21 1358   ? ?  ? PT SHORT TERM GOAL #1  ? Title Patient will be independent in home exercise program to improve strength/mobility for better functional independence with ADLs.   ? Baseline 8/9: HEP to be given next session, 10/31: patient reports compliance with HEP and would like progressions added next session. 08/22/2021=Patient verbalized that he is walking and doing his LE exercises with no questions at this time.   ? Time 4   ? Period Weeks   ? Status Achieved   ? Target Date 08/16/21   ? ?  ?  ? ?  ? ? ? ? PT Long Term Goals - 10/12/21 1410   ? ?  ? PT LONG TERM GOAL #1  ? Title Patient will increase FOTO score to equal to or greater than 70%    to demonstrate statistically significant improvement in mobility and quality of life.   ? Baseline 8/9: 64%; risk adjusted 34%; 03/21/21 FOTO: 45; 04/24/21 FOTO: 49% ; 08/22/2021= 56% 4/20: 60%   ? Time 12   ? Period Weeks   ? Status Partially Met   ? Target Date 01/04/22   ?  ? PT LONG TERM GOAL #2  ? Title Patient will increase Berg Balance score by > 6 points to demonstrate decreased fall risk during  functional activities.   ? Baseline 4/20: 43/56   ? Time 12   ? Period Weeks   ? Status New   ? Target Date 01/04/22   ?  ? PT LONG TERM GOAL #3  ? Title Patient will increase 10 meter walk test to >1.66ms as to improve gait speed for better community ambulation and to reduce fall risk.   ? Baseline 8/9: 0.65 m/s with rollator; 10/31: 0.68  m/s with rollator. 07/06/2021= 0.71 m/s using rollator; 08/22/2021= 0.94 m/s using rollator. 4/20: 1.1 m/s with rollator   ? Time 12   ? Period Weeks   ? Status Achieved   ? Target Date 10/11/21   ?  ? PT LONG TERM GOAL #4  ? Title Patient will increase six minute walk test distance to >1000 for progression to community ambulator and improve gait ability   ? Baseline 8/9: 505 ft with rollator; 03/21/21: 721f c rollator, 04/24/21: 7562fc rollator; 07/06/2021=900 feet; 08/22/2021= 935 feet with use of Rollator 44/20: 940 ft   ? Time 12   ? Period Weeks   ? Status Partially Met   ? Target Date 01/04/22   ?  ? PT LONG TERM GOAL #5  ? Title Patient will increase glute medius strength on Left LE from 3-/5 to 4/5 to improve stability, gait mechanics, and functional strength.   ? Baseline 10/31: 3-/5; 07/06/2021= 3-/5; 08/22/2021=3-/5 unable to achieve full ROM yet able to withstand some resistance with testing 4/20: unable to obtainfull ROM against gravity   ? Time 12   ? Period Weeks   ? Status On-going   ? Target Date 01/04/22   ?  ? Additional Long Term Goals  ? Additional Long Term Goals Yes   ?  ? PT LONG TERM GOAL #6  ? Title Patient will increase Left single leg stance time to 15 seconds or greater to increase safety in shower and independence with ADLs.   ? Baseline 10/31; 1.83 sec without UE support; 08/22/2021=2-3  sec  on left; 6 sec on right 4/20: RLE 10 seconds LLE unable to perform   ? Time 12   ? Period Weeks   ? Status On-going   ? Target Date 01/04/22   ?  ? PT LONG TERM GOAL #7  ? Title Patient will safely negotiate 13 steps with handrails to safely enter/exit mothers house.    ? Baseline 4/20: very challenging   ? Time 12   ? Period Weeks   ? Status New   ? Target Date 01/04/22   ? ?  ?  ? ?  ? ? ? ? ? ? ? ? Plan - 11/02/21 1132   ? ? Clinical Impression Statement Patient presents wi

## 2021-11-02 NOTE — Telephone Encounter (Signed)
Prescription refill request for Eliquis received. ?Indication:Afib ?Last office visit:5/23 ?Scr:0.9 ?Age: 66 ?Weight:118.8 kg ? ?Prescription refilled ? ?

## 2021-11-07 ENCOUNTER — Ambulatory Visit: Payer: Medicare PPO | Admitting: Physical Therapy

## 2021-11-07 ENCOUNTER — Encounter: Payer: Medicare PPO | Admitting: Occupational Therapy

## 2021-11-09 ENCOUNTER — Ambulatory Visit: Payer: Medicare PPO

## 2021-11-09 DIAGNOSIS — R262 Difficulty in walking, not elsewhere classified: Secondary | ICD-10-CM

## 2021-11-09 DIAGNOSIS — R2681 Unsteadiness on feet: Secondary | ICD-10-CM

## 2021-11-09 DIAGNOSIS — M6281 Muscle weakness (generalized): Secondary | ICD-10-CM | POA: Diagnosis not present

## 2021-11-09 DIAGNOSIS — R269 Unspecified abnormalities of gait and mobility: Secondary | ICD-10-CM

## 2021-11-09 DIAGNOSIS — R2689 Other abnormalities of gait and mobility: Secondary | ICD-10-CM

## 2021-11-09 DIAGNOSIS — R278 Other lack of coordination: Secondary | ICD-10-CM

## 2021-11-09 DIAGNOSIS — I890 Lymphedema, not elsewhere classified: Secondary | ICD-10-CM | POA: Diagnosis not present

## 2021-11-09 NOTE — Therapy (Signed)
[place] MAIN [facility] SERVICES [address] [place], [place], [address] Phone: [phone]   Fax:  [phone]  Physical Therapy Treatment  Patient Details  Name: Dustin Gray MRN: [id number] Date of Birth: [birth year] No data recorded  Encounter Date: 11/09/2021   PT End of Session - 11/09/21 1802     Visit Number 55    Number of Visits 79    Date for PT Re-Evaluation 01/04/22    Authorization Type [place] primary  Cert 2/44/01-0/27/25    Authorization Time Period 08/23/21-11/23/21 24 visit    Progress Note Due on Visit 60    PT Start Time 1346    PT Stop Time 1429    PT Time Calculation (min) 43 min    Equipment Utilized During Treatment Gait belt    Activity Tolerance Patient tolerated treatment well;No increased pain    Behavior During Therapy WFL for tasks assessed/performed             Past Medical History:  Diagnosis Date   Abscess    groin   Arthritis    lower spine   Erectile dysfunction    Low testosterone    Lumbar herniated disc    L5   Multiple sclerosis (HCC)    Staph aureus infection     Past Surgical History:  Procedure Laterality Date   COLONOSCOPY WITH PROPOFOL N/A 07/04/2016   Procedure: COLONOSCOPY WITH PROPOFOL;  Surgeon: [name], MD;  Location: [place];  Service: Endoscopy;  Laterality: N/A;   [place]   POLYPECTOMY  07/04/2016   Procedure: POLYPECTOMY;  Surgeon: [name], MD;  Location: [place];  Service: Endoscopy;;   TONSILLECTOMY      There were no vitals filed for this visit.   Subjective Assessment - 11/09/21 1350     Subjective Patient reports was having some stomach issues on Tues but feeling better today.    Pertinent History Patient presents to physical therapy for unsteadiness, walking instability, fatigue. His PMH includes paroxysmal a fib, MS, lymphedema, hypothyroidism, Graves Disease, arthritis, lumbar  herniated disc (L5), depression and neuropathy. Patient first developed symptoms of MS in 2005 and was diagnosed in 2006. Patient has done PT in the past, but hasn't been seen since 2020 at [facility]. Has not been doing his exercises in the past year. Walk outside to garage to ride lawnmower, walks in house. 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 a couple hours    How long can you stand comfortably? no more than 10 minutes before pain, have to steady self immediately    How long can you walk comfortably? has to use rollator. can walk in a store with a cart    Patient Stated Goals want to get stronger and feel mor comfortable walking around. Patient would like to continue strengthening hips and improve endurance and ability to pace self for safety while walking.    Currently in Pain? No/denies             INTERVENTIONS:   Step up onto 6" block x 15 reps each LE using 4lb - Patient reports as hard- intermittent left knee hyperextension in first several reps- did improve with practice.   Side step up/over hedgehog with 4lb AW x 12 reps.  Heel  to toe gait sequencing 4lb. In // bars 2 sets of 10 reps  Standing dynamic high knee march 4lb Alt LE x 20 reps  Standing ham curl- 0 weight on left and 4lb AW on right x 12 reps  Terminal knee ext using GTB x 20 reps on left Knee- fatigued with 1-2 hyperext episodes  Sit to stand +overhead raise with yellow kick ball x 12 reps    Education provided throughout session via VC/TC and demonstration to facilitate movement at target joints and correct muscle activation for all testing and exercises performed.                         PT Education - 11/10/21 1803     Education Details Exercise technique    Person(s) Educated Patient    Methods Explanation;Demonstration;Tactile cues;Verbal  cues    Comprehension Verbalized understanding;Returned demonstration;Verbal cues required;Tactile cues required;Need further instruction              PT Short Term Goals - 08/22/21 1358       PT SHORT TERM GOAL #1   Title Patient will be independent in home exercise program to improve strength/mobility for better functional independence with ADLs.    Baseline 8/9: HEP to be given next session, 10/31: patient reports compliance with HEP and would like progressions added next session. 08/22/2021=Patient verbalized that he is walking and doing his LE exercises with no questions at this time.    Time 4    Period Weeks    Status Achieved    Target Date 08/16/21               PT Long Term Goals - 10/12/21 1410       PT LONG TERM GOAL #1   Title Patient will increase FOTO score to equal to or greater than 70%    to demonstrate statistically significant improvement in mobility and quality of life.    Baseline 8/9: 64%; risk adjusted 34%; 03/21/21 FOTO: 45; 04/24/21 FOTO: 49% ; 08/22/2021= 56% 4/20: 60%    Time 12    Period Weeks    Status Partially Met    Target Date 01/04/22      PT LONG TERM GOAL #2   Title Patient will increase Berg Balance score by > 6 points to demonstrate decreased fall risk during functional activities.    Baseline 4/20: 43/56    Time 12    Period Weeks    Status New    Target Date 01/04/22      PT LONG TERM GOAL #3   Title Patient will increase 10 meter walk test to >1.0m/s as to improve gait speed for better community ambulation and to reduce fall risk.    Baseline 8/9: 0.65 m/s with rollator; 10/31: 0.68 m/s with rollator. 07/06/2021= 0.71 m/s using rollator; 08/22/2021= 0.94 m/s using rollator. 4/20: 1.1 m/s with rollator    Time 12    Period Weeks    Status Achieved    Target Date 10/11/21      PT LONG TERM GOAL #4   Title Patient will increase six minute walk test distance to >1000 for progression to community ambulator and improve gait ability     Baseline 8/9: 505 ft with rollator; 03/21/21: 770ft c rollator, 04/24/21: 755ft c rollator; 07/06/2021=900 feet; 08/22/2021= 935 feet with use of Rollator 44/20: 940 ft    Time 12    Period Weeks    Status Partially Met      Target Date 01/04/22      PT LONG TERM GOAL #5   Title Patient will increase glute medius strength on Left LE from 3-/5 to 4/5 to improve stability, gait mechanics, and functional strength.    Baseline 10/31: 3-/5; 07/06/2021= 3-/5; 08/22/2021=3-/5 unable to achieve full ROM yet able to withstand some resistance with testing 4/20: unable to obtainfull ROM against gravity    Time 12    Period Weeks    Status On-going    Target Date 01/04/22      Additional Long Term Goals   Additional Long Term Goals Yes      PT LONG TERM GOAL #6   Title Patient will increase Left single leg stance time to 15 seconds or greater to increase safety in shower and independence with ADLs.    Baseline 10/31; 1.83 sec without UE support; 08/22/2021=2-3  sec  on left; 6 sec on right 4/20: RLE 10 seconds LLE unable to perform    Time 12    Period Weeks    Status On-going    Target Date 01/04/22      PT LONG TERM GOAL #7   Title Patient will safely negotiate 13 steps with handrails to safely enter/exit mothers house.    Baseline 4/20: very challenging    Time 12    Period Weeks    Status New    Target Date 01/04/22                   Plan - 11/09/21 1801     Clinical Impression Statement Patient presents with good motivation for today's session. He presents with ongoing left LE weakness and continued hyperextension with more left LE weight bearing. Patient reported difficulty with resistance on left LE yet able to complete prescribed dosasge without rest/stoppage. Patient will continue to benefit from skilled physical therapy intervention in order to improve his lower extremity strength, stability, mobility, and quality of life.    Personal Factors and Comorbidities Age;Comorbidity  3+;Fitness;Past/Current Experience;Time since onset of injury/illness/exacerbation    Comorbidities aroxysmal a fib, MS, lymphedema, hypothyroidism, Graves Disease, arthritis, lumbar herniated disc (L5), depression and neuropathy    Examination-Activity Limitations Bed Mobility;Bend;Caring for Others;Carry;Reach Overhead;Locomotion Level;Lift;Hygiene/Grooming;Dressing;Squat;Stairs;Stand;Toileting;Transfers    Examination-Participation Restrictions Cleaning;Community Activity;Laundry;Occupation;Shop;Volunteer;Yard Work    Stability/Clinical Decision Making Evolving/Moderate complexity    Rehab Potential Fair    PT Frequency 2x / week    PT Duration 12 weeks    PT Treatment/Interventions ADLs/Self Care Home Management;Aquatic Therapy;Canalith Repostioning;Biofeedback;Cryotherapy;Ultrasound;Traction;Moist Heat;Electrical Stimulation;DME Instruction;Gait training;Therapeutic exercise;Therapeutic activities;Functional mobility training;Stair training;Balance training;Neuromuscular re-education;Patient/family education;Manual techniques;Orthotic Fit/Training;Compression bandaging;Passive range of motion;Vestibular;Taping;Splinting;Energy conservation;Dry needling;Visual/perceptual remediation/compensation    PT Next Visit Plan Continue with progessive balance training, Progressive LE strenthening as appropriate.    PT Home Exercise Plan Access Code: [email or web address] URL: [email or web address]  Added Bridges with BTB and encouraged more walking around the home during commerical breaks when watching TV.    Consulted and Agree with Plan of Care Patient             Patient will benefit from skilled therapeutic intervention in order to improve the following deficits and impairments:  Abnormal gait, Cardiopulmonary status limiting activity, Decreased activity tolerance, Decreased coordination, Decreased balance, Decreased endurance, Decreased mobility, Decreased strength, Decreased range of motion,  Difficulty walking, Impaired flexibility, Impaired sensation, Postural dysfunction, Improper body mechanics  Visit Diagnosis: Unsteadiness on feet  Abnormality of gait and mobility  Difficulty in walking, not elsewhere classified  Muscle weakness (generalized)  Other abnormalities of gait and   mobility  Other lack of coordination     Problem List Patient Active Problem List   Diagnosis Date Noted   Persistent atrial fibrillation (HCC)    Graves disease    Abdominal bloating    Atrial fibrillation with RVR (HCC) 10/01/2019   Hyperthyroidism    Atrial fibrillation with rapid ventricular response (HCC) 09/30/2019   Leg edema    Left leg cellulitis    Special screening for malignant neoplasms, colon    Polyp of sigmoid colon    Benign neoplasm of ascending colon    Rectal polyp    Herniated nucleus pulposus, L5-S1 11/09/2015   Low back pain 10/25/2015   Herniated nucleus pulposus, C5-6 right 10/06/2015   Dysesthesia 09/16/2015   Spasticity 09/16/2015   Unsteady gait 09/16/2015   Multiple sclerosis (HCC) 06/08/2011    Jeffrey N Westbrooks, PT 11/10/2021, 6:04 PM  [place] [facility] MAIN REHAB SERVICES [address] [place], [place], [address] Phone: [phone]   Fax:  [phone]  Name: Dustin Gray MRN: [id number] Date of Birth: 07/29/1955    

## 2021-11-13 DIAGNOSIS — G35 Multiple sclerosis: Secondary | ICD-10-CM | POA: Diagnosis not present

## 2021-11-13 DIAGNOSIS — M5136 Other intervertebral disc degeneration, lumbar region: Secondary | ICD-10-CM | POA: Diagnosis not present

## 2021-11-14 ENCOUNTER — Ambulatory Visit: Payer: Medicare PPO

## 2021-11-14 ENCOUNTER — Other Ambulatory Visit: Payer: Self-pay | Admitting: Neurology

## 2021-11-14 ENCOUNTER — Encounter: Payer: Medicare PPO | Admitting: Occupational Therapy

## 2021-11-14 DIAGNOSIS — R2681 Unsteadiness on feet: Secondary | ICD-10-CM | POA: Diagnosis not present

## 2021-11-14 DIAGNOSIS — M6281 Muscle weakness (generalized): Secondary | ICD-10-CM | POA: Diagnosis not present

## 2021-11-14 DIAGNOSIS — R269 Unspecified abnormalities of gait and mobility: Secondary | ICD-10-CM | POA: Diagnosis not present

## 2021-11-14 DIAGNOSIS — R278 Other lack of coordination: Secondary | ICD-10-CM | POA: Diagnosis not present

## 2021-11-14 DIAGNOSIS — R2689 Other abnormalities of gait and mobility: Secondary | ICD-10-CM

## 2021-11-14 DIAGNOSIS — M5136 Other intervertebral disc degeneration, lumbar region: Secondary | ICD-10-CM

## 2021-11-14 DIAGNOSIS — G35 Multiple sclerosis: Secondary | ICD-10-CM

## 2021-11-14 DIAGNOSIS — I890 Lymphedema, not elsewhere classified: Secondary | ICD-10-CM | POA: Diagnosis not present

## 2021-11-14 DIAGNOSIS — R262 Difficulty in walking, not elsewhere classified: Secondary | ICD-10-CM | POA: Diagnosis not present

## 2021-11-14 NOTE — Therapy (Signed)
[place] MAIN [facility] SERVICES [address] [place] [address] [place], [place], [address] Phone: [phone]   Fax:  [phone]  Physical Therapy Treatment  Patient Details  Name: Dustin Gray MRN: [id number] Date of Birth: January 27, 1956 No data recorded  Encounter Date: 11/14/2021   PT End of Session - 11/14/21 1439     Visit Number 81    Number of Visits 37    Date for PT Re-Evaluation 01/04/22    Authorization Type [place] primary  Cert 10/06/22-09/23/00    Authorization Time Period 08/23/21-11/23/21 24 visit    Progress Note Due on Visit 60    PT Start Time 1433    PT Stop Time 1515    PT Time Calculation (min) 42 min    Equipment Utilized During Treatment Gait belt    Activity Tolerance Patient tolerated treatment well;No increased pain    Behavior During Therapy WFL for tasks assessed/performed             Past Medical History:  Diagnosis Date   Abscess    groin   Arthritis    lower spine   Erectile dysfunction    Low testosterone    Lumbar herniated disc    L5   Multiple sclerosis (HCC)    Staph aureus infection     Past Surgical History:  Procedure Laterality Date   COLONOSCOPY WITH PROPOFOL N/A 07/04/2016   Procedure: COLONOSCOPY WITH PROPOFOL;  Surgeon: [name], MD;  Location: [place];  Service: Endoscopy;  Laterality: N/A;   [place]   POLYPECTOMY  07/04/2016   Procedure: POLYPECTOMY;  Surgeon: [name], MD;  Location: [place];  Service: Endoscopy;;   TONSILLECTOMY      There were no vitals filed for this visit.   Subjective Assessment - 11/14/21 1559     Subjective Patient reports doing okay today with no new issues.    Pertinent History Patient presents to physical therapy for unsteadiness, walking instability, fatigue. His PMH includes paroxysmal a fib, MS, lymphedema, hypothyroidism, Graves Disease, arthritis, lumbar herniated disc (L5),  depression and neuropathy. Patient first developed symptoms of MS in 2005 and was diagnosed in 2006. Patient has done PT in the past, but hasn't been seen since 2020 at [facility]. Has not been doing his exercises in the past year. Walk outside to garage to ride lawnmower, walks in house. 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 a couple hours    How long can you stand comfortably? no more than 10 minutes before pain, have to steady self immediately    How long can you walk comfortably? has to use rollator. can walk in a store with a cart    Patient Stated Goals want to get stronger and feel mor comfortable walking around. Patient would like to continue strengthening hips and improve endurance and ability to pace self for safety while walking.    Currently in Pain? No/denies             INTERVENTIONS:  Therex   -Seated LAQ (4lb on left and 3lb on right) x 20 reps  -Standing on balance beam - side stepping with min BUE support x 10 (4lb on left LE and 3lb on right)   -seated hip march (3lb on right and no weight  on left) x 10 reps  - Seated hip flex/abd/Add - swing leg up/over hedgehog with leg extended right LE 3 lb x20 reps and left LE (no added weight ) x 15 reps.   -Sit to stand using airex pad under right LE x 10 and 3 more without pad.   - stand- heel strike to toe off  x 20 reps each LE   Education provided throughout session via VC/TC and demonstration to facilitate movement at target joints and correct muscle activation for all testing and exercises performed.                        PT Education - 11/14/21 1600     Education Details Exercise technique    Person(s) Educated Patient    Methods Explanation;Demonstration;Tactile cues;Verbal cues    Comprehension Verbalized understanding;Returned demonstration;Verbal  cues required;Tactile cues required;Need further instruction              PT Short Term Goals - 08/22/21 1358       PT SHORT TERM GOAL #1   Title Patient will be independent in home exercise program to improve strength/mobility for better functional independence with ADLs.    Baseline 8/9: HEP to be given next session, 10/31: patient reports compliance with HEP and would like progressions added next session. 08/22/2021=Patient verbalized that he is walking and doing his LE exercises with no questions at this time.    Time 4    Period Weeks    Status Achieved    Target Date 08/16/21               PT Long Term Goals - 10/12/21 1410       PT LONG TERM GOAL #1   Title Patient will increase FOTO score to equal to or greater than 70%    to demonstrate statistically significant improvement in mobility and quality of life.    Baseline 8/9: 64%; risk adjusted 34%; 03/21/21 FOTO: 45; 04/24/21 FOTO: 49% ; 08/22/2021= 56% 4/20: 60%    Time 12    Period Weeks    Status Partially Met    Target Date 01/04/22      PT LONG TERM GOAL #2   Title Patient will increase Berg Balance score by > 6 points to demonstrate decreased fall risk during functional activities.    Baseline 4/20: 43/56    Time 12    Period Weeks    Status New    Target Date 01/04/22      PT LONG TERM GOAL #3   Title Patient will increase 10 meter walk test to >1.11ms as to improve gait speed for better community ambulation and to reduce fall risk.    Baseline 8/9: 0.65 m/s with rollator; 10/31: 0.68 m/s with rollator. 07/06/2021= 0.71 m/s using rollator; 08/22/2021= 0.94 m/s using rollator. 4/20: 1.1 m/s with rollator    Time 12    Period Weeks    Status Achieved    Target Date 10/11/21      PT LONG TERM GOAL #4   Title Patient will increase six minute walk test distance to >1000 for progression to community ambulator and improve gait ability    Baseline 8/9: 505 ft with rollator; 03/21/21: 7729fc rollator, 04/24/21:  75561f rollator; 07/06/2021=900 feet; 08/22/2021= 935 feet with use of Rollator 44/20: 940 ft    Time 12    Period Weeks    Status Partially Met    Target Date 01/04/22  PT LONG TERM GOAL #5   Title Patient will increase glute medius strength on Left LE from 3-/5 to 4/5 to improve stability, gait mechanics, and functional strength.    Baseline 10/31: 3-/5; 07/06/2021= 3-/5; 08/22/2021=3-/5 unable to achieve full ROM yet able to withstand some resistance with testing 4/20: unable to obtainfull ROM against gravity    Time 12    Period Weeks    Status On-going    Target Date 01/04/22      Additional Long Term Goals   Additional Long Term Goals Yes      PT LONG TERM GOAL #6   Title Patient will increase Left single leg stance time to 15 seconds or greater to increase safety in shower and independence with ADLs.    Baseline 10/31; 1.83 sec without UE support; 08/22/2021=2-3  sec  on left; 6 sec on right 4/20: RLE 10 seconds LLE unable to perform    Time 12    Period Weeks    Status On-going    Target Date 01/04/22      PT LONG TERM GOAL #7   Title Patient will safely negotiate 13 steps with handrails to safely enter/exit mothers house.    Baseline 4/20: very challenging    Time 12    Period Weeks    Status New    Target Date 01/04/22                   Plan - 11/14/21 1535     Clinical Impression Statement Patient challenged today with seated and standing activities- Increased fatigue in left LE yet patient surprised himself with ability to march with Left LE on his own. He was also able to perform heel strike well today with increased concentration. Patient will continue to benefit from skilled physical therapy intervention in order to improve his lower extremity strength, stability, mobility, and quality of life.    Personal Factors and Comorbidities Age;Comorbidity 3+;Fitness;Past/Current Experience;Time since onset of injury/illness/exacerbation    Comorbidities  aroxysmal a fib, MS, lymphedema, hypothyroidism, Graves Disease, arthritis, lumbar herniated disc (L5), depression and neuropathy    Examination-Activity Limitations Bed Mobility;Bend;Caring for Others;Carry;Reach Overhead;Locomotion Level;Lift;Hygiene/Grooming;Dressing;Squat;Stairs;Stand;Toileting;Transfers    Examination-Participation Restrictions Cleaning;Community Activity;Laundry;Occupation;Shop;Volunteer;Yard Work    [employer] Evolving/Moderate complexity    Rehab Potential Fair    PT Frequency 2x / week    PT Duration 12 weeks    PT Treatment/Interventions ADLs/Self Care Home Management;Aquatic Therapy;Canalith Repostioning;Biofeedback;Cryotherapy;Ultrasound;Traction;Moist Heat;Electrical Stimulation;DME Instruction;Gait training;Therapeutic exercise;Therapeutic activities;Functional mobility training;Stair training;Balance training;Neuromuscular re-education;Patient/family education;Manual techniques;Orthotic Fit/Training;Compression bandaging;Passive range of motion;Vestibular;Taping;Splinting;Energy conservation;Dry needling;Visual/perceptual remediation/compensation    PT Next Visit Plan Continue with progessive balance training, Progressive LE strenthening as appropriate.    PT Home Exercise Plan Access Code: [email or web address] URL: [email or web address]  Added Bridges with BTB and encouraged more walking around the home during commerical breaks when watching TV.    Consulted and Agree with Plan of Care Patient             Patient will benefit from skilled therapeutic intervention in order to improve the following deficits and impairments:  Abnormal gait, Cardiopulmonary status limiting activity, Decreased activity tolerance, Decreased coordination, Decreased balance, Decreased endurance, Decreased mobility, Decreased strength, Decreased range of motion, Difficulty walking, Impaired flexibility, Impaired sensation, Postural dysfunction, Improper body  mechanics  Visit Diagnosis: Unsteadiness on feet  Abnormality of gait and mobility  Difficulty in walking, not elsewhere classified  Muscle weakness (generalized)  Other abnormalities of gait and mobility  Other lack of coordination  Problem List Patient Active Problem List   Diagnosis Date Noted   Persistent atrial fibrillation (HCC)    Graves disease    Abdominal bloating    Atrial fibrillation with RVR ([place]) 10/01/2019   Hyperthyroidism    Atrial fibrillation with rapid ventricular response ([place]) 09/30/2019   Leg edema    Left leg cellulitis    Special screening for malignant neoplasms, colon    Polyp of sigmoid colon    Benign neoplasm of ascending colon    Rectal polyp    Herniated nucleus pulposus, L5-S1 11/09/2015   Low back pain 10/25/2015   Herniated nucleus pulposus, C5-6 right 10/06/2015   Dysesthesia 09/16/2015   Spasticity 09/16/2015   Unsteady gait 09/16/2015   Multiple sclerosis ([place]) 06/08/2011    Lewis Moccasin, PT 11/14/2021, 4:00 PM  [place] MAIN [facility] SERVICES [address] [place], [place], [address] Phone: [phone]   Fax:  [phone]  Name: Dustin Gray MRN: [id number] Date of Birth: 1956/03/29

## 2021-11-16 ENCOUNTER — Ambulatory Visit: Payer: Medicare PPO

## 2021-11-16 DIAGNOSIS — R262 Difficulty in walking, not elsewhere classified: Secondary | ICD-10-CM

## 2021-11-16 DIAGNOSIS — M6281 Muscle weakness (generalized): Secondary | ICD-10-CM | POA: Diagnosis not present

## 2021-11-16 DIAGNOSIS — R2681 Unsteadiness on feet: Secondary | ICD-10-CM | POA: Diagnosis not present

## 2021-11-16 DIAGNOSIS — R2689 Other abnormalities of gait and mobility: Secondary | ICD-10-CM

## 2021-11-16 DIAGNOSIS — R278 Other lack of coordination: Secondary | ICD-10-CM | POA: Diagnosis not present

## 2021-11-16 DIAGNOSIS — R269 Unspecified abnormalities of gait and mobility: Secondary | ICD-10-CM | POA: Diagnosis not present

## 2021-11-16 DIAGNOSIS — I890 Lymphedema, not elsewhere classified: Secondary | ICD-10-CM | POA: Diagnosis not present

## 2021-11-16 NOTE — Therapy (Signed)
[place] MAIN [facility] SERVICES [address] [place], [place], [address] Phone: [phone]   Fax:  [phone]  Physical Therapy Treatment  Patient Details  Name: Dustin Gray MRN: [id number] Date of Birth: 16-Dec-1955 No data recorded  Encounter Date: 11/16/2021   PT End of Session - 11/16/21 1401     Visit Number 15    Number of Visits 48    Date for PT Re-Evaluation 01/04/22    Authorization Type [place] primary    Authorization Time Period 08/23/21-11/23/21 24 visits    Authorization - Number of Visits 24    Progress Note Due on Visit 60    PT Start Time 1347    PT Stop Time 1427    PT Time Calculation (min) 40 min    Equipment Utilized During Treatment Gait belt    Activity Tolerance Patient tolerated treatment well;No increased pain    Behavior During Therapy WFL for tasks assessed/performed             Past Medical History:  Diagnosis Date   Abscess    groin   Arthritis    lower spine   Erectile dysfunction    Low testosterone    Lumbar herniated disc    L5   Multiple sclerosis (HCC)    Staph aureus infection     Past Surgical History:  Procedure Laterality Date   COLONOSCOPY WITH PROPOFOL N/A 07/04/2016   Procedure: COLONOSCOPY WITH PROPOFOL;  Surgeon: [name], MD;  Location: [place];  Service: Endoscopy;  Laterality: N/A;   [place]   POLYPECTOMY  07/04/2016   Procedure: POLYPECTOMY;  Surgeon: [name], MD;  Location: [place];  Service: Endoscopy;;   TONSILLECTOMY      There were no vitals filed for this visit.   Subjective Assessment - 11/16/21 1354     Subjective Pt doing well today. Saw neurologist recently, will schedule a brain MRI. Will also send for a repeat lumbar MRI to determine need for surgical intervention now that COVID precautions are lower.    Pertinent History Patient presents to physical therapy for unsteadiness,  walking instability, fatigue. His PMH includes paroxysmal a fib, MS, lymphedema, hypothyroidism, Graves Disease, arthritis, lumbar herniated disc (L5), depression and neuropathy. Patient first developed symptoms of MS in 2005 and was diagnosed in 2006. Patient has done PT in the past, but hasn't been seen since 2020 at [facility]. Has not been doing his exercises in the past year. Walk outside to garage to ride lawnmower, walks in house. 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 No/denies             INTERVENTION THIS DATE: -lateral stepping at plinth, hands ad lib only 3 RT bilat -lateral stepping around plinth with 2 turns 3 RT bilat  -seated LAQ 1x10 bilat @ 5lbAW -seated HS curls 1x10 bilat @ BTB -seated LAQ 1x8 bilat @ 9lbAW bilat  -seated HS curls 1x10 bilat @ BTB -STS from elevated plinth 1x12 hands free, no cues for form, no LOB    PT Short Term Goals - 08/22/21 1358       PT SHORT TERM GOAL #1   Title Patient will be independent in home exercise program to improve strength/mobility for better functional independence with ADLs.    Baseline 8/9: HEP to be given next  session, 10/31: patient reports compliance with HEP and would like progressions added next session. 08/22/2021=Patient verbalized that he is walking and doing his LE exercises with no questions at this time.    Time 4    Period Weeks    Status Achieved    Target Date 08/16/21               PT Long Term Goals - 10/12/21 1410       PT LONG TERM GOAL #1   Title Patient will increase FOTO score to equal to or greater than 70%    to demonstrate statistically significant improvement in mobility and quality of life.    Baseline 8/9: 64%; risk adjusted 34%; 03/21/21 FOTO: 45; 04/24/21 FOTO: 49% ; 08/22/2021= 56% 4/20: 60%    Time 12    Period Weeks    Status Partially Met    Target Date 01/04/22      PT LONG TERM GOAL #2   Title Patient will  increase Berg Balance score by > 6 points to demonstrate decreased fall risk during functional activities.    Baseline 4/20: 43/56    Time 12    Period Weeks    Status New    Target Date 01/04/22      PT LONG TERM GOAL #3   Title Patient will increase 10 meter walk test to >1.23ms as to improve gait speed for better community ambulation and to reduce fall risk.    Baseline 8/9: 0.65 m/s with rollator; 10/31: 0.68 m/s with rollator. 07/06/2021= 0.71 m/s using rollator; 08/22/2021= 0.94 m/s using rollator. 4/20: 1.1 m/s with rollator    Time 12    Period Weeks    Status Achieved    Target Date 10/11/21      PT LONG TERM GOAL #4   Title Patient will increase six minute walk test distance to >1000 for progression to community ambulator and improve gait ability    Baseline 8/9: 505 ft with rollator; 03/21/21: 7772fc rollator, 04/24/21: 75529f rollator; 07/06/2021=900 feet; 08/22/2021= 935 feet with use of Rollator 44/20: 940 ft    Time 12    Period Weeks    Status Partially Met    Target Date 01/04/22      PT LONG TERM GOAL #5   Title Patient will increase glute medius strength on Left LE from 3-/5 to 4/5 to improve stability, gait mechanics, and functional strength.    Baseline 10/31: 3-/5; 07/06/2021= 3-/5; 08/22/2021=3-/5 unable to achieve full ROM yet able to withstand some resistance with testing 4/20: unable to obtainfull ROM against gravity    Time 12    Period Weeks    Status On-going    Target Date 01/04/22      Additional Long Term Goals   Additional Long Term Goals Yes      PT LONG TERM GOAL #6   Title Patient will increase Left single leg stance time to 15 seconds or greater to increase safety in shower and independence with ADLs.    Baseline 10/31; 1.83 sec without UE support; 08/22/2021=2-3  sec  on left; 6 sec on right 4/20: RLE 10 seconds LLE unable to perform    Time 12    Period Weeks    Status On-going    Target Date 01/04/22      PT LONG TERM GOAL #7   Title  Patient will safely negotiate 13 steps with handrails to safely enter/exit mothers house.    Baseline 4/20: very  challenging    Time 12    Period Weeks    Status New    Target Date 01/04/22                   Plan - 11/16/21 1410     Clinical Impression Statement Continued work standing support and balance. Took a break from mod resistance high rep to heavy resistance below rep power training to maximize neural adaptation. Pt continues to demonstrate near full left trendelenburg with what appears to be almost  exclusive lateral hip ligamentous stability, however he remains surprisingly well compensated with balance strategies in stepping.    Personal Factors and Comorbidities Age;Comorbidity 3+;Fitness;Past/Current Experience;Time since onset of injury/illness/exacerbation    Comorbidities aroxysmal a fib, MS, lymphedema, hypothyroidism, Graves Disease, arthritis, lumbar herniated disc (L5), depression and neuropathy    Examination-Activity Limitations Bed Mobility;Bend;Caring for Others;Carry;Reach Overhead;Locomotion Level;Lift;Hygiene/Grooming;Dressing;Squat;Stairs;Stand;Toileting;Transfers    Examination-Participation Restrictions Cleaning;Community Activity;Laundry;Occupation;Shop;Volunteer;Yard Work    Merchant [employer] officer Evolving/Moderate complexity    Clinical Decision Making Moderate    Rehab Potential Fair    PT Frequency 2x / week    PT Duration 12 weeks    PT Treatment/Interventions ADLs/Self Care Home Management;Aquatic Therapy;Canalith Repostioning;Biofeedback;Cryotherapy;Ultrasound;Traction;Moist Heat;Electrical Stimulation;DME Instruction;Gait training;Therapeutic exercise;Therapeutic activities;Functional mobility training;Stair training;Balance training;Neuromuscular re-education;Patient/family education;Manual techniques;Orthotic Fit/Training;Compression bandaging;Passive range of motion;Vestibular;Taping;Splinting;Energy conservation;Dry  needling;Visual/perceptual remediation/compensation    PT Next Visit Plan Continue with progessive balance training, Progressive LE strenthening as appropriate.    PT Home Exercise Plan Access Code: [email or web address] URL: [email or web address]  Added Bridges with BTB and encouraged more walking around the home during commerical breaks when watching TV.    Consulted and Agree with Plan of Care Patient             Patient will benefit from skilled therapeutic intervention in order to improve the following deficits and impairments:  Abnormal gait, Cardiopulmonary status limiting activity, Decreased activity tolerance, Decreased coordination, Decreased balance, Decreased endurance, Decreased mobility, Decreased strength, Decreased range of motion, Difficulty walking, Impaired flexibility, Impaired sensation, Postural dysfunction, Improper body mechanics  Visit Diagnosis: Unsteadiness on feet  Abnormality of gait and mobility  Difficulty in walking, not elsewhere classified  Muscle weakness (generalized)  Other abnormalities of gait and mobility     Problem List Patient Active Problem List   Diagnosis Date Noted   Persistent atrial fibrillation (HCC)    Graves disease    Abdominal bloating    Atrial fibrillation with RVR ([place]) 10/01/2019   Hyperthyroidism    Atrial fibrillation with rapid ventricular response ([place]) 09/30/2019   Leg edema    Left leg cellulitis    Special screening for malignant neoplasms, colon    Polyp of sigmoid colon    Benign neoplasm of ascending colon    Rectal polyp    Herniated nucleus pulposus, L5-S1 11/09/2015   Low back pain 10/25/2015   Herniated nucleus pulposus, C5-6 right 10/06/2015   Dysesthesia 09/16/2015   Spasticity 09/16/2015   Unsteady gait 09/16/2015   Multiple sclerosis ([place]) 06/08/2011   2:36 PM, 11/16/21 Etta Grandchild, PT, DPT Physical Therapist - [facility]  Outpatient Physical  Therapy- [place] [phone]     [place], PT 11/16/2021, 2:19 PM  [place] MAIN [facility] SERVICES [address] [place], [place], [address] Phone: [phone]   Fax:  [phone]  Name: Dustin Gray MRN: [id number] Date of Birth: [birth year]

## 2021-11-21 ENCOUNTER — Ambulatory Visit: Payer: Medicare PPO

## 2021-11-21 ENCOUNTER — Encounter: Payer: Medicare PPO | Admitting: Occupational Therapy

## 2021-11-21 DIAGNOSIS — M6281 Muscle weakness (generalized): Secondary | ICD-10-CM | POA: Diagnosis not present

## 2021-11-21 DIAGNOSIS — R2689 Other abnormalities of gait and mobility: Secondary | ICD-10-CM

## 2021-11-21 DIAGNOSIS — R278 Other lack of coordination: Secondary | ICD-10-CM | POA: Diagnosis not present

## 2021-11-21 DIAGNOSIS — R269 Unspecified abnormalities of gait and mobility: Secondary | ICD-10-CM

## 2021-11-21 DIAGNOSIS — R2681 Unsteadiness on feet: Secondary | ICD-10-CM | POA: Diagnosis not present

## 2021-11-21 DIAGNOSIS — I890 Lymphedema, not elsewhere classified: Secondary | ICD-10-CM

## 2021-11-21 DIAGNOSIS — R262 Difficulty in walking, not elsewhere classified: Secondary | ICD-10-CM

## 2021-11-21 DIAGNOSIS — E0500 Thyrotoxicosis with diffuse goiter without thyrotoxic crisis or storm: Secondary | ICD-10-CM | POA: Diagnosis not present

## 2021-11-21 NOTE — Therapy (Signed)
[place] MAIN [facility] SERVICES [address] [place], [place], [address] Phone: [phone]   Fax:  [phone]  Physical Therapy Treatment  Patient Details  Name: Dustin Gray MRN: [id number] Date of Birth: [birth year] No data recorded  Encounter Date: 11/21/2021    Past Medical History:  Diagnosis Date   Abscess    groin   Arthritis    lower spine   Erectile dysfunction    Low testosterone    Lumbar herniated disc    L5   Multiple sclerosis ([place])    Staph aureus infection     Past Surgical History:  Procedure Laterality Date   COLONOSCOPY WITH PROPOFOL N/A 07/04/2016   Procedure: COLONOSCOPY WITH PROPOFOL;  Surgeon: [name], MD;  Location: [place];  Service: Endoscopy;  Laterality: N/A;   [place]   POLYPECTOMY  07/04/2016   Procedure: POLYPECTOMY;  Surgeon: [name], MD;  Location: [place];  Service: Endoscopy;;   TONSILLECTOMY      There were no vitals filed for this visit.   Subjective Assessment - 11/21/21 1439     Subjective Patient reports no new changes- states going to have a cardioversion procedure next session.    Pertinent History Patient presents to physical therapy for unsteadiness, walking instability, fatigue. His PMH includes paroxysmal a fib, MS, lymphedema, hypothyroidism, Graves Disease, arthritis, lumbar herniated disc (L5), depression and neuropathy. Patient first developed symptoms of MS in 2005 and was diagnosed in 2006. Patient has done PT in the past, but hasn't been seen since 2020 at [facility]. Has not been doing his exercises in the past year. Walk outside to garage to ride lawnmower, walks in house. Retired Automotive engineer. Has a rollator at home but doesn't use it in the house                    INTERVENTIONS:   Therex    -Gait- in clinic (without an AD as he reports not using an AD in home but does endorse some  furniture walk) - approx 70 feet - VC for increased heel to toe     -seated hip march (3lb on right and no weight on left) x 10 reps   - Seated hip flex/abd/Add - swing leg up/over hedgehog with leg extended right LE 3 lb x20 reps and left LE (no added weight ) x 15 reps.    -Sit to stand using airex pad under right LE x 12 reps   - stand- heel strike to toe off  x 20 reps each LE with blue TB      Education provided throughout session via VC/TC and demonstration to facilitate movement at target joints and correct muscle activation for all testing and exercises performed.    Patient required brief rest breaks to complete all activities.                       PT Short Term Goals - 08/22/21 1358       PT SHORT TERM GOAL #1   Title Patient will be independent in home exercise program to improve strength/mobility for better functional independence with ADLs.    Baseline 8/9: HEP to be given next session, 10/31: patient reports compliance with HEP and would like progressions added next session. 08/22/2021=Patient verbalized that he is walking and doing his LE exercises with no questions at this time.  Time 4    Period Weeks    Status Achieved    Target Date 08/16/21               PT Long Term Goals - 10/12/21 1410       PT LONG TERM GOAL #1   Title Patient will increase FOTO score to equal to or greater than 70%    to demonstrate statistically significant improvement in mobility and quality of life.    Baseline 8/9: 64%; risk adjusted 34%; 03/21/21 FOTO: 45; 04/24/21 FOTO: 49% ; 08/22/2021= 56% 4/20: 60%    Time 12    Period Weeks    Status Partially Met    Target Date 01/04/22      PT LONG TERM GOAL #2   Title Patient will increase Berg Balance score by > 6 points to demonstrate decreased fall risk during functional activities.    Baseline 4/20: 43/56    Time 12    Period Weeks    Status New    Target Date 01/04/22      PT LONG TERM GOAL #3   Title  Patient will increase 10 meter walk test to >1.85ms as to improve gait speed for better community ambulation and to reduce fall risk.    Baseline 8/9: 0.65 m/s with rollator; 10/31: 0.68 m/s with rollator. 07/06/2021= 0.71 m/s using rollator; 08/22/2021= 0.94 m/s using rollator. 4/20: 1.1 m/s with rollator    Time 12    Period Weeks    Status Achieved    Target Date 10/11/21      PT LONG TERM GOAL #4   Title Patient will increase six minute walk test distance to >1000 for progression to community ambulator and improve gait ability    Baseline 8/9: 505 ft with rollator; 03/21/21: 7713fc rollator, 04/24/21: 75534f rollator; 07/06/2021=900 feet; 08/22/2021= 935 feet with use of Rollator 44/20: 940 ft    Time 12    Period Weeks    Status Partially Met    Target Date 01/04/22      PT LONG TERM GOAL #5   Title Patient will increase glute medius strength on Left LE from 3-/5 to 4/5 to improve stability, gait mechanics, and functional strength.    Baseline 10/31: 3-/5; 07/06/2021= 3-/5; 08/22/2021=3-/5 unable to achieve full ROM yet able to withstand some resistance with testing 4/20: unable to obtainfull ROM against gravity    Time 12    Period Weeks    Status On-going    Target Date 01/04/22      Additional Long Term Goals   Additional Long Term Goals Yes      PT LONG TERM GOAL #6   Title Patient will increase Left single leg stance time to 15 seconds or greater to increase safety in shower and independence with ADLs.    Baseline 10/31; 1.83 sec without UE support; 08/22/2021=2-3  sec  on left; 6 sec on right 4/20: RLE 10 seconds LLE unable to perform    Time 12    Period Weeks    Status On-going    Target Date 01/04/22      PT LONG TERM GOAL #7   Title Patient will safely negotiate 13 steps with handrails to safely enter/exit mothers house.    Baseline 4/20: very challenging    Time 12    Period Weeks    Status New    Target Date 01/04/22  Plan - 11/21/21  1707     Clinical Impression Statement Patient presents with good motivation for today's session. He was responsive to VC and TC to perform activities correctly- She was able to perform LE strengthening  without significant difficulty other than fatigue. He presents with left hip and quad weakness yet able to focus on these areas and improve with muscle control with VC/TC today. Patient will continue to benefit from skilled physical therapy intervention in order to improve his lower extremity strength, stability, mobility, and quality of life.    Personal Factors and Comorbidities Age;Comorbidity 3+;Fitness;Past/Current Experience;Time since onset of injury/illness/exacerbation    Comorbidities aroxysmal a fib, MS, lymphedema, hypothyroidism, Graves Disease, arthritis, lumbar herniated disc (L5), depression and neuropathy    Examination-Activity Limitations Bed Mobility;Bend;Caring for Others;Carry;Reach Overhead;Locomotion Level;Lift;Hygiene/Grooming;Dressing;Squat;Stairs;Stand;Toileting;Transfers    Examination-Participation Restrictions Cleaning;Community Activity;Laundry;Occupation;Shop;Volunteer;Yard Work    Merchant [employer] officer Evolving/Moderate complexity    Rehab Potential Fair    PT Frequency 2x / week    PT Duration 12 weeks    PT Treatment/Interventions ADLs/Self Care Home Management;Aquatic Therapy;Canalith Repostioning;Biofeedback;Cryotherapy;Ultrasound;Traction;Moist Heat;Electrical Stimulation;DME Instruction;Gait training;Therapeutic exercise;Therapeutic activities;Functional mobility training;Stair training;Balance training;Neuromuscular re-education;Patient/family education;Manual techniques;Orthotic Fit/Training;Compression bandaging;Passive range of motion;Vestibular;Taping;Splinting;Energy conservation;Dry needling;Visual/perceptual remediation/compensation    PT Next Visit Plan Continue with progessive balance training, Progressive LE strenthening as appropriate.     PT Home Exercise Plan Access Code: [email or web address] URL: [email or web address]  Added Bridges with BTB and encouraged more walking around the home during commerical breaks when watching TV.    Consulted and Agree with Plan of Care Patient             Patient will benefit from skilled therapeutic intervention in order to improve the following deficits and impairments:  Abnormal gait, Cardiopulmonary status limiting activity, Decreased activity tolerance, Decreased coordination, Decreased balance, Decreased endurance, Decreased mobility, Decreased strength, Decreased range of motion, Difficulty walking, Impaired flexibility, Impaired sensation, Postural dysfunction, Improper body mechanics  Visit Diagnosis: Unsteadiness on feet  Abnormality of gait and mobility  Difficulty in walking, not elsewhere classified  Muscle weakness (generalized)  Other abnormalities of gait and mobility  Other lack of coordination  Lymphedema, not elsewhere classified     Problem List Patient Active Problem List   Diagnosis Date Noted   Persistent atrial fibrillation (HCC)    Graves disease    Abdominal bloating    Atrial fibrillation with RVR ([place]) 10/01/2019   Hyperthyroidism    Atrial fibrillation with rapid ventricular response ([place]) 09/30/2019   Leg edema    Left leg cellulitis    Special screening for malignant neoplasms, colon    Polyp of sigmoid colon    Benign neoplasm of ascending colon    Rectal polyp    Herniated nucleus pulposus, L5-S1 11/09/2015   Low back pain 10/25/2015   Herniated nucleus pulposus, C5-6 right 10/06/2015   Dysesthesia 09/16/2015   Spasticity 09/16/2015   Unsteady gait 09/16/2015   Multiple sclerosis ([place]) 06/08/2011    Lewis Moccasin, PT 11/21/2021, 5:11 PM  [place] [facility] MAIN [facility] SERVICES [address] [place], [place], [address] Phone: [phone]   Fax:  [phone]  Name: Dustin Gray MRN:  [id number] Date of Birth: [birth year]

## 2021-11-22 ENCOUNTER — Encounter: Payer: Self-pay | Admitting: Cardiology

## 2021-11-22 ENCOUNTER — Other Ambulatory Visit: Payer: Self-pay

## 2021-11-22 ENCOUNTER — Ambulatory Visit
Admission: RE | Admit: 2021-11-22 | Discharge: 2021-11-22 | Disposition: A | Discharge status: Home / Self Care | Payer: Medicare PPO | Attending: Cardiology | Admitting: Cardiology

## 2021-11-22 ENCOUNTER — Ambulatory Visit: Payer: Medicare PPO | Admitting: Certified Registered Nurse Anesthetist

## 2021-11-22 ENCOUNTER — Encounter
Admission: RE | Disposition: A | Discharge status: Home / Self Care | Payer: Self-pay | Source: Home / Self Care | Attending: Cardiology

## 2021-11-22 DIAGNOSIS — Z79899 Other long term (current) drug therapy: Secondary | ICD-10-CM | POA: Insufficient documentation

## 2021-11-22 DIAGNOSIS — I4891 Unspecified atrial fibrillation: Secondary | ICD-10-CM | POA: Diagnosis not present

## 2021-11-22 DIAGNOSIS — Z7901 Long term (current) use of anticoagulants: Secondary | ICD-10-CM | POA: Diagnosis not present

## 2021-11-22 DIAGNOSIS — Z87891 Personal history of nicotine dependence: Secondary | ICD-10-CM | POA: Insufficient documentation

## 2021-11-22 DIAGNOSIS — E0590 Thyrotoxicosis, unspecified without thyrotoxic crisis or storm: Secondary | ICD-10-CM | POA: Diagnosis not present

## 2021-11-22 DIAGNOSIS — F1010 Alcohol abuse, uncomplicated: Secondary | ICD-10-CM | POA: Diagnosis not present

## 2021-11-22 DIAGNOSIS — I4819 Other persistent atrial fibrillation: Secondary | ICD-10-CM | POA: Diagnosis not present

## 2021-11-22 HISTORY — PX: CARDIOVERSION: SHX1299

## 2021-11-22 SURGERY — CARDIOVERSION
Anesthesia: General

## 2021-11-22 MED ORDER — PROPOFOL 10 MG/ML IV BOLUS
INTRAVENOUS | Status: DC | PRN
  Administered 2021-11-22: 70 mg via INTRAVENOUS

## 2021-11-22 MED ORDER — PROPOFOL 10 MG/ML IV BOLUS
INTRAVENOUS | Status: AC
  Filled 2021-11-22: qty 20

## 2021-11-22 MED ORDER — SODIUM CHLORIDE 0.9 % IV SOLN: INTRAVENOUS | Status: DC

## 2021-11-22 NOTE — Anesthesia Postprocedure Evaluation (Signed)
Anesthesia Post Note  Patient: Dustin Gray  Procedure(s) Performed: CARDIOVERSION  Patient location during evaluation: Cath Lab Anesthesia Type: General Level of consciousness: awake and alert Pain management: pain level controlled Vital Signs Assessment: post-procedure vital signs reviewed and stable Respiratory status: spontaneous breathing, nonlabored ventilation, respiratory function stable and patient connected to nasal cannula oxygen Cardiovascular status: blood pressure returned to baseline and stable Postop Assessment: no apparent nausea or vomiting Anesthetic complications: no   No notable events documented.   Last Vitals:  Vitals:   11/22/21 0800 11/22/21 0815  BP: 116/77 120/81  Pulse: 62 67  Resp: 10 14  Temp:    SpO2: 100% 99%    Last Pain:  Vitals:   11/22/21 0815  TempSrc:   PainSc: 0-No pain                 Precious Haws Hettie Roselli

## 2021-11-22 NOTE — Anesthesia Procedure Notes (Signed)
Date/Time: 11/22/2021 7:32 AM Performed by: Lily Peer, Adalena Abdulla, CRNA Pre-anesthesia Checklist: Patient identified, Emergency Drugs available, Patient being monitored, Suction available and Timeout performed Patient Re-evaluated:Patient Re-evaluated prior to induction Oxygen Delivery Method: Nasal cannula Induction Type: IV induction

## 2021-11-22 NOTE — Anesthesia Preprocedure Evaluation (Signed)
Anesthesia Evaluation  Patient identified by MRN, date of birth, ID band Patient awake    Reviewed: Allergy & Precautions, NPO status , Patient's Chart, lab work & pertinent test results  History of Anesthesia Complications Negative for: history of anesthetic complications  Airway Mallampati: III  TM Distance: <3 FB Neck ROM: full    Dental  (+) Chipped, Poor Dentition   Pulmonary neg shortness of breath, former smoker,    Pulmonary exam normal        Cardiovascular Exercise Tolerance: Good (-) angina(-) Past MI + dysrhythmias Atrial Fibrillation  Rhythm:irregular Rate:Normal     Neuro/Psych negative neurological ROS  negative psych ROS   GI/Hepatic negative GI ROS, Neg liver ROS,   Endo/Other  Hyperthyroidism   Renal/GU negative Renal ROS  negative genitourinary   Musculoskeletal   Abdominal   Peds  Hematology negative hematology ROS (+)   Anesthesia Other Findings Past Medical History: No date: Abscess     Comment:  groin No date: Arthritis     Comment:  lower spine No date: Erectile dysfunction No date: Low testosterone No date: Lumbar herniated disc     Comment:  L5 No date: Multiple sclerosis (HCC) No date: Staph aureus infection  Past Surgical History: 07/04/2016: COLONOSCOPY WITH PROPOFOL; N/A     Comment:  Procedure: COLONOSCOPY WITH PROPOFOL;  Surgeon: [name], MD;  Location: [place];  Service:               Endoscopy;  Laterality: N/A; 1964: FINGER SURGERY 1960: MASS EXCISION 07/04/2016: POLYPECTOMY     Comment:  Procedure: POLYPECTOMY;  Surgeon: [name], MD;                Location: [place];  Service: Endoscopy;; No date: TONSILLECTOMY  BMI    Body Mass Index: 33.64 kg/m      Reproductive/Obstetrics negative OB ROS                             Anesthesia Physical Anesthesia Plan  ASA: 3  Anesthesia Plan:  General   Post-op Pain Management:    Induction: Intravenous  PONV Risk Score and Plan: Propofol infusion and TIVA  Airway Management Planned: Natural Airway and Nasal Cannula  Additional Equipment:   Intra-op Plan:   Post-operative Plan:   Informed Consent: I have reviewed the patients History and Physical, chart, labs and discussed the procedure including the risks, benefits and alternatives for the proposed anesthesia with the patient or authorized representative who has indicated his/her understanding and acceptance.     Dental Advisory Given  Plan Discussed with: Anesthesiologist, CRNA and Surgeon  Anesthesia Plan Comments: (Patient consented for risks of anesthesia including but not limited to:  - adverse reactions to medications - risk of airway placement if required - damage to eyes, teeth, lips or other oral mucosa - nerve damage due to positioning  - sore throat or hoarseness - Damage to heart, brain, nerves, lungs, other parts of body or loss of life  Patient voiced understanding.)        Anesthesia Quick Evaluation

## 2021-11-22 NOTE — Interval H&P Note (Signed)
History and Physical Interval Note:  11/22/2021 7:33 AM  Dustin Gray  has presented today for surgery, with the diagnosis of AFib. The various methods of treatment have been discussed with the patient and family. After consideration of risks, benefits and other options for treatment, the patient has consented to  Procedure(s): CARDIOVERSION (N/A) as a surgical intervention.  The patient's history has been reviewed, patient examined, no change in status, stable for surgery.  I have reviewed the patient's chart and labs.  Questions were answered to the patient's satisfaction.     [name]

## 2021-11-22 NOTE — Progress Notes (Signed)
Cardioversion procedure note ?For atrial fibrillation. ? ?Procedure Details: ? ?Consent: Risks of procedure as well as the alternatives and risks of each were explained to the (patient/caregiver).  Consent for procedure obtained. ? ?Time Out: Verified patient identification, verified procedure, site/side was marked, verified correct patient position, special equipment/implants available, medications/allergies/relevent history reviewed, required imaging and test results available.  Performed ? ?Patient placed on cardiac monitor, pulse oximetry, supplemental oxygen as necessary.   ?Sedation given: propofol IV per anesthesia team  ?Pacer pads placed anterior and posterior chest. ? ? ?Cardioverted 1 time(s).   ?Cardioverted at  200J. Synchronized biphasic ?Converted to NSR ? ? ?Evaluation: ?Findings: Post procedure EKG shows: NSR ?Complications: None ?Patient did tolerate procedure well. ? ?Time Spent Directly with the Patient: ? ?35 minutes  ? ?Jesi Jurgens Agbor-Etang, M.D.  ?

## 2021-11-22 NOTE — Transfer of Care (Signed)
Immediate Anesthesia Transfer of Care Note  Patient: Dustin Gray  Procedure(s) Performed: CARDIOVERSION  Patient Location: Cath Lab  Anesthesia Type:General  Level of Consciousness: drowsy  Airway & Oxygen Therapy: Patient Spontanous Breathing and Patient connected to nasal cannula oxygen  Post-op Assessment: Report given to RN and Post -op Vital signs reviewed and stable  Post vital signs: Reviewed and stable  Last Vitals:  Vitals Value Taken Time  BP 120/83 11/22/21 0733  Temp    Pulse 66 11/22/21 0735  Resp 16 11/22/21 0735  SpO2 94 % 11/22/21 0735    Last Pain:  Vitals:   11/22/21 0718  TempSrc: Oral  PainSc: 0-No pain         Complications: No notable events documented.

## 2021-11-23 ENCOUNTER — Ambulatory Visit: Payer: Medicare PPO | Attending: Neurology

## 2021-11-23 ENCOUNTER — Encounter: Payer: Medicare PPO | Admitting: Occupational Therapy

## 2021-11-23 DIAGNOSIS — R269 Unspecified abnormalities of gait and mobility: Secondary | ICD-10-CM | POA: Diagnosis not present

## 2021-11-23 DIAGNOSIS — I890 Lymphedema, not elsewhere classified: Secondary | ICD-10-CM | POA: Diagnosis not present

## 2021-11-23 DIAGNOSIS — R278 Other lack of coordination: Secondary | ICD-10-CM | POA: Insufficient documentation

## 2021-11-23 DIAGNOSIS — R2689 Other abnormalities of gait and mobility: Secondary | ICD-10-CM | POA: Diagnosis not present

## 2021-11-23 DIAGNOSIS — M6281 Muscle weakness (generalized): Secondary | ICD-10-CM | POA: Insufficient documentation

## 2021-11-23 DIAGNOSIS — R262 Difficulty in walking, not elsewhere classified: Secondary | ICD-10-CM | POA: Insufficient documentation

## 2021-11-23 DIAGNOSIS — R2681 Unsteadiness on feet: Secondary | ICD-10-CM | POA: Insufficient documentation

## 2021-11-23 NOTE — Therapy (Signed)
[place] MAIN [facility] SERVICES [address] [place], [place], [address] Phone: [phone]   Fax:  [phone]  Physical Therapy Treatment  Patient Details  Name: Dustin Gray MRN: [id number] Date of Birth: 1956-01-04 No data recorded  Encounter Date: 11/23/2021   PT End of Session - 11/23/21 1645     Visit Number 12    Number of Visits 56    Date for PT Re-Evaluation 01/04/22    Authorization Type [place] primary    Authorization Time Period 08/23/21-11/23/21 24 visits    Authorization - Number of Visits 24    Progress Note Due on Visit 60    PT Start Time 1518    PT Stop Time 1553    PT Time Calculation (min) 35 min    Equipment Utilized During Treatment Gait belt    Activity Tolerance Patient tolerated treatment well;No increased pain    Behavior During Therapy WFL for tasks assessed/performed             Past Medical History:  Diagnosis Date   Abscess    groin   Arthritis    lower spine   Erectile dysfunction    Low testosterone    Lumbar herniated disc    L5   Multiple sclerosis ([place])    Staph aureus infection     Past Surgical History:  Procedure Laterality Date   CARDIOVERSION N/A 11/22/2021   Procedure: CARDIOVERSION;  Surgeon: [name], MD;  Location: [facility];  Service: Cardiovascular;  Laterality: N/A;   COLONOSCOPY WITH PROPOFOL N/A 07/04/2016   Procedure: COLONOSCOPY WITH PROPOFOL;  Surgeon: [name], MD;  Location: [place];  Service: Endoscopy;  Laterality: N/A;   [place]   POLYPECTOMY  07/04/2016   Procedure: POLYPECTOMY;  Surgeon: [name], MD;  Location: [place];  Service: Endoscopy;;   TONSILLECTOMY      There were no vitals filed for this visit.   Subjective Assessment - 11/23/21 1539     Subjective Patient reports he had his cardioversion yesterday and reports he has fatigued/tired from procedure but no new  issuues.    Pertinent History Patient presents to physical therapy for unsteadiness, walking instability, fatigue. His PMH includes paroxysmal a fib, MS, lymphedema, hypothyroidism, Graves Disease, arthritis, lumbar herniated disc (L5), depression and neuropathy. Patient first developed symptoms of MS in 2005 and was diagnosed in 2006. Patient has done PT in the past, but hasn't been seen since 2020 at [facility]. Has not been doing his exercises in the past year. Walk outside to garage to ride lawnmower, walks in house. 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 a couple hours    How long can you stand comfortably? no more than 10 minutes before pain, have to steady self immediately    How long can you walk comfortably? has to use rollator. can walk in a store with a cart    Patient Stated Goals want to get stronger and feel mor comfortable walking around. Patient would like to continue strengthening hips and improve endurance and ability to pace self for safety while walking.    Currently in Pain? No/denies             INTERVENTIONS:   Therapeutic Exercises: For LE Strengthening  Resting HR  prior to session = 70 bpm  Nustep - LO (LE only) x 6 min - checking HR periodically- 72-76 bpm  Seated therex:  -ham curl (matrix cable) 12.5 lb RLE x 12 reps; 2.5 lb on LLE x 12 rep - 2 sets (Patient sitting on airex pad on chair)   LAQ: 5lb AW on Right LE and 3lb on LLE x 2 sets x 15 reps HR= 80  bpm  Increased rest breaks allotted today and treatment limited secondary to patient had cardioversion procedure yesterday.   Education provided throughout session via VC/TC and demonstration to facilitate movement at target joints and correct muscle activation for all testing and exercises performed.                              PT  Education - 11/23/21 1647     Education Details Exericse technique    Person(s) Educated Patient    Methods Explanation;Demonstration;Tactile cues;Verbal cues    Comprehension Verbalized understanding;Returned demonstration;Verbal cues required;Tactile cues required;Need further instruction              PT Short Term Goals - 08/22/21 1358       PT SHORT TERM GOAL #1   Title Patient will be independent in home exercise program to improve strength/mobility for better functional independence with ADLs.    Baseline 8/9: HEP to be given next session, 10/31: patient reports compliance with HEP and would like progressions added next session. 08/22/2021=Patient verbalized that he is walking and doing his LE exercises with no questions at this time.    Time 4    Period Weeks    Status Achieved    Target Date 08/16/21               PT Long Term Goals - 10/12/21 1410       PT LONG TERM GOAL #1   Title Patient will increase FOTO score to equal to or greater than 70%    to demonstrate statistically significant improvement in mobility and quality of life.    Baseline 8/9: 64%; risk adjusted 34%; 03/21/21 FOTO: 45; 04/24/21 FOTO: 49% ; 08/22/2021= 56% 4/20: 60%    Time 12    Period Weeks    Status Partially Met    Target Date 01/04/22      PT LONG TERM GOAL #2   Title Patient will increase Berg Balance score by > 6 points to demonstrate decreased fall risk during functional activities.    Baseline 4/20: 43/56    Time 12    Period Weeks    Status New    Target Date 01/04/22      PT LONG TERM GOAL #3   Title Patient will increase 10 meter walk test to >1.24ms as to improve gait speed for better community ambulation and to reduce fall risk.    Baseline 8/9: 0.65 m/s with rollator; 10/31: 0.68 m/s with rollator. 07/06/2021= 0.71 m/s using rollator; 08/22/2021= 0.94 m/s using rollator. 4/20: 1.1 m/s with rollator    Time 12    Period Weeks    Status Achieved    Target Date 10/11/21       PT LONG TERM GOAL #4   Title Patient will increase six minute walk test distance to >1000 for progression to community ambulator and improve gait ability    Baseline 8/9: 505 ft with rollator; 03/21/21: [id number] rollator, 04/24/21: [id number] rollator; 07/06/2021=900 feet; 08/22/2021= 935 feet  with use of Rollator 44/20: 940 ft    Time 12    Period Weeks    Status Partially Met    Target Date 01/04/22      PT LONG TERM GOAL #5   Title Patient will increase glute medius strength on Left LE from 3-/5 to 4/5 to improve stability, gait mechanics, and functional strength.    Baseline 10/31: 3-/5; 07/06/2021= 3-/5; 08/22/2021=3-/5 unable to achieve full ROM yet able to withstand some resistance with testing 4/20: unable to obtainfull ROM against gravity    Time 12    Period Weeks    Status On-going    Target Date 01/04/22      Additional Long Term Goals   Additional Long Term Goals Yes      PT LONG TERM GOAL #6   Title Patient will increase Left single leg stance time to 15 seconds or greater to increase safety in shower and independence with ADLs.    Baseline 10/31; 1.83 sec without UE support; 08/22/2021=2-3  sec  on left; 6 sec on right 4/20: RLE 10 seconds LLE unable to perform    Time 12    Period Weeks    Status On-going    Target Date 01/04/22      PT LONG TERM GOAL #7   Title Patient will safely negotiate 13 steps with handrails to safely enter/exit mothers house.    Baseline 4/20: very challenging    Time 12    Period Weeks    Status New    Target Date 01/04/22                   Plan - 11/23/21 1645     Clinical Impression Statement Treatment limited by design today with close monitoring of HR- recorded between 70- 80 bpm during today's session. He denied any issues and stated mild fatigue but otherwise okay with Resistive strengthening exercises today. Patient will continue to benefit from skilled physical therapy intervention in order to improve his lower extremity  strength, stability, mobility, and quality of life.    Personal Factors and Comorbidities Age;Comorbidity 3+;Fitness;Past/Current Experience;Time since onset of injury/illness/exacerbation    Comorbidities aroxysmal a fib, MS, lymphedema, hypothyroidism, Graves Disease, arthritis, lumbar herniated disc (L5), depression and neuropathy    Examination-Activity Limitations Bed Mobility;Bend;Caring for Others;Carry;Reach Overhead;Locomotion Level;Lift;Hygiene/Grooming;Dressing;Squat;Stairs;Stand;Toileting;Transfers    Examination-Participation Restrictions Cleaning;Community Activity;Laundry;Occupation;Shop;Volunteer;Yard Work    [employer] officer Evolving/Moderate complexity    Rehab Potential Fair    PT Frequency 2x / week    PT Duration 12 weeks    PT Treatment/Interventions ADLs/Self Care Home Management;Aquatic Therapy;Canalith Repostioning;Biofeedback;Cryotherapy;Ultrasound;Traction;Moist Heat;Electrical Stimulation;DME Instruction;Gait training;Therapeutic exercise;Therapeutic activities;Functional mobility training;Stair training;Balance training;Neuromuscular re-education;Patient/family education;Manual techniques;Orthotic Fit/Training;Compression bandaging;Passive range of motion;Vestibular;Taping;Splinting;Energy conservation;Dry needling;Visual/perceptual remediation/compensation    PT Next Visit Plan Continue with progessive balance training, Progressive LE strenthening as appropriate.    PT Home Exercise Plan Access Code: [email or web address] URL: [email or web address]  Added [place] with BTB and encouraged more walking around the home during commerical breaks when watching TV.    Consulted and Agree with Plan of Care Patient             Patient will benefit from skilled therapeutic intervention in order to improve the following deficits and impairments:  Abnormal gait, Cardiopulmonary status limiting activity, Decreased activity tolerance, Decreased coordination,  Decreased balance, Decreased endurance, Decreased mobility, Decreased strength, Decreased range of motion, Difficulty walking, Impaired flexibility, Impaired sensation, Postural dysfunction, Improper body mechanics  Visit Diagnosis: Unsteadiness on feet  Abnormality of  gait and mobility  Difficulty in walking, not elsewhere classified  Muscle weakness (generalized)  Other abnormalities of gait and mobility  Other lack of coordination     Problem List Patient Active Problem List   Diagnosis Date Noted   Persistent atrial fibrillation (HCC)    Graves disease    Abdominal bloating    Atrial fibrillation with RVR ([place]) 10/01/2019   Hyperthyroidism    Atrial fibrillation with rapid ventricular response ([place]) 09/30/2019   Leg edema    Left leg cellulitis    Special screening for malignant neoplasms, colon    Polyp of sigmoid colon    Benign neoplasm of ascending colon    Rectal polyp    Herniated nucleus pulposus, L5-S1 11/09/2015   Low back pain 10/25/2015   Herniated nucleus pulposus, C5-6 right 10/06/2015   Dysesthesia 09/16/2015   Spasticity 09/16/2015   Unsteady gait 09/16/2015   Multiple sclerosis ([place]) 06/08/2011    Lewis Moccasin, PT 11/23/2021, 4:47 PM  [place] MAIN [facility] SERVICES [address] [place], [place], [address] Phone: [phone]   Fax:  [phone]  Name: NIRVAAN FRETT MRN: [id number] Date of Birth: [birth year]

## 2021-11-27 ENCOUNTER — Encounter: Payer: Medicare PPO | Admitting: Occupational Therapy

## 2021-11-27 DIAGNOSIS — E0500 Thyrotoxicosis with diffuse goiter without thyrotoxic crisis or storm: Secondary | ICD-10-CM | POA: Diagnosis not present

## 2021-11-28 ENCOUNTER — Ambulatory Visit: Payer: Medicare PPO | Admitting: Physical Therapy

## 2021-11-28 DIAGNOSIS — R269 Unspecified abnormalities of gait and mobility: Secondary | ICD-10-CM

## 2021-11-28 DIAGNOSIS — R262 Difficulty in walking, not elsewhere classified: Secondary | ICD-10-CM

## 2021-11-28 DIAGNOSIS — R2681 Unsteadiness on feet: Secondary | ICD-10-CM | POA: Diagnosis not present

## 2021-11-28 DIAGNOSIS — M6281 Muscle weakness (generalized): Secondary | ICD-10-CM | POA: Diagnosis not present

## 2021-11-28 DIAGNOSIS — I890 Lymphedema, not elsewhere classified: Secondary | ICD-10-CM | POA: Diagnosis not present

## 2021-11-28 DIAGNOSIS — R278 Other lack of coordination: Secondary | ICD-10-CM | POA: Diagnosis not present

## 2021-11-28 DIAGNOSIS — R2689 Other abnormalities of gait and mobility: Secondary | ICD-10-CM | POA: Diagnosis not present

## 2021-11-28 NOTE — Therapy (Signed)
[place] MAIN [facility] SERVICES [address] [place], [place], [address] Phone: [phone]   Fax:  [phone]  Physical Therapy Treatment/Physical Therapy Progress Note   Dates of reporting period  10/19/21   to   11/28/21   Patient Details  Name: Dustin Gray MRN: [id number] Date of Birth: [birth year] No data recorded  Encounter Date: 11/28/2021   PT End of Session - 11/28/21 1650     Visit Number 60    Number of Visits 81    Date for PT Re-Evaluation 01/04/22    Authorization Type [employer] primary    Authorization Time Period 08/23/21-11/23/21 24 visits    Authorization - Number of Visits 24    Progress Note Due on Visit 56    PT Start Time 0104    PT Stop Time 0144    PT Time Calculation (min) 40 min    Equipment Utilized During Treatment Gait belt    Activity Tolerance Patient tolerated treatment well;No increased pain    Behavior During Therapy WFL for tasks assessed/performed             Past Medical History:  Diagnosis Date   Abscess    groin   Arthritis    lower spine   Erectile dysfunction    Low testosterone    Lumbar herniated disc    L5   Multiple sclerosis ([place])    Staph aureus infection     Past Surgical History:  Procedure Laterality Date   CARDIOVERSION N/A 11/22/2021   Procedure: CARDIOVERSION;  Surgeon: [name], MD;  Location: [facility];  Service: Cardiovascular;  Laterality: N/A;   COLONOSCOPY WITH PROPOFOL N/A 07/04/2016   Procedure: COLONOSCOPY WITH PROPOFOL;  Surgeon: [name], MD;  Location: [place];  Service: Endoscopy;  Laterality: N/A;   [place]   POLYPECTOMY  07/04/2016   Procedure: POLYPECTOMY;  Surgeon: [name], MD;  Location: [place];  Service: Endoscopy;;   TONSILLECTOMY      There were no vitals filed for this visit.   Subjective Assessment - 11/28/21 1307     Subjective Pt reports feeling better  following cardioversion but he has been told by his endochronologist that he is having thyroid issues which may be contributing to his recent weight gain. Pt reports contunued stumbles but no falls. Pt sometimes loses balance when he stands up from his recliner following sitting for too long.    Pertinent History Patient presents to physical therapy for unsteadiness, walking instability, fatigue. His PMH includes paroxysmal a fib, MS, lymphedema, hypothyroidism, Graves Disease, arthritis, lumbar herniated disc (L5), depression and neuropathy. Patient first developed symptoms of MS in 2005 and was diagnosed in 2006. Patient has done PT in the past, but hasn't been seen since 2020 at [facility]. Has not been doing his exercises in the past year. Walk outside to garage to ride lawnmower, walks in house. 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 a couple hours    How long can you stand comfortably? no more than 10 minutes before pain, have to steady self immediately    How long can you walk comfortably? has to use rollator. can walk in a store with a cart    Patient Stated Goals want to get stronger and feel mor  comfortable walking around. Patient would like to continue strengthening hips and improve endurance and ability to pace self for safety while walking.                [facility] PT Assessment - 11/28/21 0001       Observation/Other Assessments   Focus on Therapeutic Outcomes (FOTO)  63      Berg Balance Test   Sit to Stand Able to stand without using hands and stabilize independently    Standing Unsupported Able to stand safely 2 minutes    Sitting with Back Unsupported but Feet Supported on Floor or Stool Able to sit safely and securely 2 minutes    Stand to Sit Sits safely with minimal use of hands    Transfers Able to transfer safely, minor use of  hands    Standing Unsupported with Eyes Closed Able to stand 10 seconds safely    Standing Unsupported with Feet Together Able to place feet together independently and stand 1 minute safely    From Standing, Reach Forward with Outstretched Arm Can reach confidently >25 cm (10")    From Standing Position, Pick up Object from Floor Able to pick up shoe safely and easily    From Standing Position, Turn to Look Behind Over each Shoulder Looks behind from both sides and weight shifts well    Turn 360 Degrees Able to turn 360 degrees safely but slowly    Standing Unsupported, Alternately Place Feet on Step/Stool Able to complete >2 steps/needs minimal assist    Standing Unsupported, One Foot in Front Able to plae foot ahead of the other independently and hold 30 seconds    Standing on One Leg Able to lift leg independently and hold equal to or more than 3 seconds    Total Score 48              Physical therapy treatment session today consisted of completing assessment of goals and administration of testing as demonstrated in flow sheet. Addition treatments may be found below.     Focus on therapeutic outcome score 63% 10 m walk test with rollator 1055 feet Left lower extremity manual muscle testing for hip abduction still remains 3- out of 5                     PT Education - 11/28/21 1309     Education Details Therapeutic progress    Person(s) Educated Patient    Methods Explanation;Demonstration;Tactile cues    Comprehension Verbalized understanding;Returned demonstration              PT Short Term Goals - 08/22/21 1358       PT SHORT TERM GOAL #1   Title Patient will be independent in home exercise program to improve strength/mobility for better functional independence with ADLs.    Baseline 8/9: HEP to be given next session, 10/31: patient reports compliance with HEP and would like progressions added next session. 08/22/2021=Patient verbalized that he is  walking and doing his LE exercises with no questions at this time.    Time 4    Period Weeks    Status Achieved    Target Date 08/16/21               PT Long Term Goals - 11/28/21 1321       PT LONG TERM GOAL #1   Title Patient will increase FOTO score to equal to or greater than 70%  to demonstrate statistically  significant improvement in mobility and quality of life.    Baseline 8/9: 64%; risk adjusted 34%; 03/21/21 FOTO: 45; 04/24/21 FOTO: 49% ; 08/22/2021= 56% 4/20: 60% 11/28/21: 63%    Time 12    Period Weeks    Status On-going    Target Date 01/04/22      PT LONG TERM GOAL #2   Title Patient will increase Berg Balance score by > 6 points to demonstrate decreased fall risk during functional activities.    Baseline 4/20: 43/56 6/6: 48/56    Time 12    Period Weeks    Status On-going    Target Date 01/04/22      PT LONG TERM GOAL #3   Title Patient will increase 10 meter walk test to >1.75ms as to improve gait speed for better community ambulation and to reduce fall risk.    Baseline 8/9: 0.65 m/s with rollator; 10/31: 0.68 m/s with rollator. 07/06/2021= 0.71 m/s using rollator; 08/22/2021= 0.94 m/s using rollator. 4/20: 1.1 m/s with rollator    Time 12    Period Weeks    Status Achieved    Target Date 10/11/21      PT LONG TERM GOAL #4   Title Patient will increase six minute walk test distance to >1000 for progression to community ambulator and improve gait ability    Baseline 8/9: 505 ft with rollator; 03/21/21: 7753fc rollator, 04/24/21: 75576f rollator; 07/06/2021=900 feet; 08/22/2021= 935 feet with use of Rollator 44/20: 940 ft 11/28/21: 1055 feet with rollator    Time 12    Period Weeks    Status Achieved    Target Date 01/04/22      PT LONG TERM GOAL #5   Title Patient will increase glute medius strength on Left LE from 3-/5 to 4/5 to improve stability, gait mechanics, and functional strength.    Baseline 10/31: 3-/5; 07/06/2021= 3-/5; 08/22/2021=3-/5 unable to  achieve full ROM yet able to withstand some resistance with testing 4/20: unable to obtainfull ROM against gravity 6/6: unable to move against gravity    Time 12    Period Weeks    Status On-going    Target Date 01/04/22      PT LONG TERM GOAL #6   Title Patient will increase Left single leg stance time to 15 seconds or greater to increase safety in shower and independence with ADLs.    Baseline 10/31; 1.83 sec without UE support; 08/22/2021=2-3  sec  on left; 6 sec on right 4/20: RLE 10 seconds LLE unable to perform 6/6: R LE 4 seconds during BERG    Time 12    Period Weeks    Status On-going    Target Date 01/04/22      PT LONG TERM GOAL #7   Title Patient will safely negotiate 13 steps with handrails to safely enter/exit mothers house.    Baseline 4/20: very challenging 6/6: able to copmlete safely even managing dogs per pt report    Time 12    Period Weeks    Status New    Target Date 01/04/22                   Plan - 11/28/21 1650     Clinical Impression Statement Patient presents to physical therapy for progress note.  Patient demonstrates significant progress with his 6-minute walk test as demonstrated by greater than 100 feet increase in his previous testing.  Patient also has made significant progress with his BerMerrilee Jansky  balance test indicating decreased risk of falls the patient still is at risk for falls based on current Berg balance test scoring.  Patient also continues to make steady progress with his focus on therapeutic outcome survey score indicating improved subjective level of function with everyday activities.  Patient will continue to benefit from skilled physical therapy in order to improve his lower extremity strength, balance, reduce risk of falls, improve his overall quality of life and mobility. Patient's condition has the potential to improve in response to therapy. Maximum improvement is yet to be obtained. The anticipated improvement is attainable and  reasonable in a generally predictable time.      Personal Factors and Comorbidities Age;Comorbidity 3+;Fitness;Past/Current Experience;Time since onset of injury/illness/exacerbation    Comorbidities aroxysmal a fib, MS, lymphedema, hypothyroidism, Graves Disease, arthritis, lumbar herniated disc (L5), depression and neuropathy    Examination-Activity Limitations Bed Mobility;Bend;Caring for Others;Carry;Reach Overhead;Locomotion Level;Lift;Hygiene/Grooming;Dressing;Squat;Stairs;Stand;Toileting;Transfers    Examination-Participation Restrictions Cleaning;Community Activity;Laundry;Occupation;Shop;Volunteer;Yard Work    Merchant [employer] officer Evolving/Moderate complexity    Rehab Potential Fair    PT Frequency 2x / week    PT Duration 12 weeks    PT Treatment/Interventions ADLs/Self Care Home Management;Aquatic Therapy;Canalith Repostioning;Biofeedback;Cryotherapy;Ultrasound;Traction;Moist Heat;Electrical Stimulation;DME Instruction;Gait training;Therapeutic exercise;Therapeutic activities;Functional mobility training;Stair training;Balance training;Neuromuscular re-education;Patient/family education;Manual techniques;Orthotic Fit/Training;Compression bandaging;Passive range of motion;Vestibular;Taping;Splinting;Energy conservation;Dry needling;Visual/perceptual remediation/compensation    PT Next Visit Plan Continue with progessive balance training, Progressive LE strenthening as appropriate.    PT Home Exercise Plan Access Code: [email or web address] URL: [email or web address]  Added Bridges with BTB and encouraged more walking around the home during commerical breaks when watching TV.    Consulted and Agree with Plan of Care Patient             Patient will benefit from skilled therapeutic intervention in order to improve the following deficits and impairments:  Abnormal gait, Cardiopulmonary status limiting activity, Decreased activity tolerance, Decreased coordination, Decreased  balance, Decreased endurance, Decreased mobility, Decreased strength, Decreased range of motion, Difficulty walking, Impaired flexibility, Impaired sensation, Postural dysfunction, Improper body mechanics  Visit Diagnosis: No diagnosis found.     Problem List Patient Active Problem List   Diagnosis Date Noted   Persistent atrial fibrillation (HCC)    Graves disease    Abdominal bloating    Atrial fibrillation with RVR ([place]) 10/01/2019   Hyperthyroidism    Atrial fibrillation with rapid ventricular response ([place]) 09/30/2019   Leg edema    Left leg cellulitis    Special screening for malignant neoplasms, colon    Polyp of sigmoid colon    Benign neoplasm of ascending colon    Rectal polyp    Herniated nucleus pulposus, L5-S1 11/09/2015   Low back pain 10/25/2015   Herniated nucleus pulposus, C5-6 right 10/06/2015   Dysesthesia 09/16/2015   Spasticity 09/16/2015   Unsteady gait 09/16/2015   Multiple sclerosis ([place]) 06/08/2011    Particia Lather, PT 11/28/2021, 4:56 PM  [place] MAIN [facility] SERVICES [address] [place], [place], [address] Phone: [phone]   Fax:  [phone]  Name: MATHEUS SPIKER MRN: [id number] Date of Birth: 02-Aug-1955

## 2021-11-30 ENCOUNTER — Ambulatory Visit: Payer: Medicare PPO | Admitting: Physical Therapy

## 2021-11-30 DIAGNOSIS — R2681 Unsteadiness on feet: Secondary | ICD-10-CM

## 2021-11-30 DIAGNOSIS — R262 Difficulty in walking, not elsewhere classified: Secondary | ICD-10-CM

## 2021-11-30 DIAGNOSIS — I890 Lymphedema, not elsewhere classified: Secondary | ICD-10-CM | POA: Diagnosis not present

## 2021-11-30 DIAGNOSIS — M6281 Muscle weakness (generalized): Secondary | ICD-10-CM

## 2021-11-30 DIAGNOSIS — R269 Unspecified abnormalities of gait and mobility: Secondary | ICD-10-CM | POA: Diagnosis not present

## 2021-11-30 DIAGNOSIS — R2689 Other abnormalities of gait and mobility: Secondary | ICD-10-CM | POA: Diagnosis not present

## 2021-11-30 DIAGNOSIS — R278 Other lack of coordination: Secondary | ICD-10-CM | POA: Diagnosis not present

## 2021-11-30 NOTE — Therapy (Signed)
[place] MAIN [facility] SERVICES [address] [place], [place], [address] Phone: [phone]   Fax:  [phone]  Physical Therapy Treatment  Patient Details  Name: Dustin Gray MRN: [id number] Date of Birth: 1955/09/16 No data recorded  Encounter Date: 11/30/2021   PT End of Session - 11/30/21 1306     Visit Number 28    Number of Visits 36    Date for PT Re-Evaluation 01/04/22    Authorization Type [place] primary    Authorization Time Period 08/23/21-11/23/21 24 visits    Authorization - Number of Visits 24    Progress Note Due on Visit 22    PT Start Time 1302    PT Stop Time 1344    PT Time Calculation (min) 42 min    Equipment Utilized During Treatment Gait belt    Activity Tolerance Patient tolerated treatment well;No increased pain    Behavior During Therapy WFL for tasks assessed/performed             Past Medical History:  Diagnosis Date   Abscess    groin   Arthritis    lower spine   Erectile dysfunction    Low testosterone    Lumbar herniated disc    L5   Multiple sclerosis ([place])    Staph aureus infection     Past Surgical History:  Procedure Laterality Date   CARDIOVERSION N/A 11/22/2021   Procedure: CARDIOVERSION;  Surgeon: [name], MD;  Location: [facility];  Service: Cardiovascular;  Laterality: N/A;   COLONOSCOPY WITH PROPOFOL N/A 07/04/2016   Procedure: COLONOSCOPY WITH PROPOFOL;  Surgeon: [name], MD;  Location: [place];  Service: Endoscopy;  Laterality: N/A;   [place]   POLYPECTOMY  07/04/2016   Procedure: POLYPECTOMY;  Surgeon: [name], MD;  Location: [place];  Service: Endoscopy;;   TONSILLECTOMY      There were no vitals filed for this visit.   Subjective Assessment - 11/30/21 1301     Subjective Pt reports no significant changes since last session.    Pertinent History Patient presents to physical therapy  for unsteadiness, walking instability, fatigue. His PMH includes paroxysmal a fib, MS, lymphedema, hypothyroidism, Graves Disease, arthritis, lumbar herniated disc (L5), depression and neuropathy. Patient first developed symptoms of MS in 2005 and was diagnosed in 2006. Patient has done PT in the past, but hasn't been seen since 2020 at [facility]. Has not been doing his exercises in the past year. Walk outside to garage to ride lawnmower, walks in house. 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 a couple hours    How long can you stand comfortably? no more than 10 minutes before pain, have to steady self immediately    How long can you walk comfortably? has to use rollator. can walk in a store with a cart    Patient Stated Goals want to get stronger and feel mor comfortable walking around. Patient would like to continue strengthening hips and improve endurance and ability to pace self for safety while walking.                INTERVENTIONS:   Therex      -seated hip march with hurdle step over and back (3lb on right and no weight on  left, hurdle on side to lower height) 2 x 10 reps ea LE    - Seated clamshell with RTB, 2 x 10 with moving R LE followed by L LE to improve L LE hip activation.   L LE side  step taps x 20 - U UE assist  R LE side step taps x 20 - UE assist   Hip hiking standing on 1 inch step 2 x 10 with UE A for balance -L LE onstep to activate glute medius   -Sit to stand using 1 inch step with 3 degree wedge toward contralateral LE  pad under right LE 2*8 reps -to challenge L LE stability and use with everyday tasks      Education provided throughout session via VC/TC and demonstration to facilitate movement at target joints and correct muscle activation for all testing and exercises performed.    Patient required brief rest  breaks to complete all activities.  Rationale for Evaluation and Treatment Rehabilitation                           PT Education - 11/30/21 1305     Education Details Exercise technique    Person(s) Educated Patient    Methods Explanation;Demonstration    Comprehension Verbalized understanding;Returned demonstration              PT Short Term Goals - 08/22/21 1358       PT SHORT TERM GOAL #1   Title Patient will be independent in home exercise program to improve strength/mobility for better functional independence with ADLs.    Baseline 8/9: HEP to be given next session, 10/31: patient reports compliance with HEP and would like progressions added next session. 08/22/2021=Patient verbalized that he is walking and doing his LE exercises with no questions at this time.    Time 4    Period Weeks    Status Achieved    Target Date 08/16/21               PT Long Term Goals - 11/28/21 1321       PT LONG TERM GOAL #1   Title Patient will increase FOTO score to equal to or greater than 70%  to demonstrate statistically significant improvement in mobility and quality of life.    Baseline 8/9: 64%; risk adjusted 34%; 03/21/21 FOTO: 45; 04/24/21 FOTO: 49% ; 08/22/2021= 56% 4/20: 60% 11/28/21: 63%    Time 12    Period Weeks    Status On-going    Target Date 01/04/22      PT LONG TERM GOAL #2   Title Patient will increase Berg Balance score by > 6 points to demonstrate decreased fall risk during functional activities.    Baseline 4/20: 43/56 6/6: 48/56    Time 12    Period Weeks    Status On-going    Target Date 01/04/22      PT LONG TERM GOAL #3   Title Patient will increase 10 meter walk test to >1.55ms as to improve gait speed for better community ambulation and to reduce fall risk.    Baseline 8/9: 0.65 m/s with rollator; 10/31: 0.68 m/s with rollator. 07/06/2021= 0.71 m/s using rollator; 08/22/2021= 0.94 m/s using rollator. 4/20: 1.1 m/s with rollator     Time 12    Period Weeks    Status Achieved    Target Date 10/11/21      PT LONG TERM GOAL #4  Title Patient will increase six minute walk test distance to >1000 for progression to community ambulator and improve gait ability    Baseline 8/9: 505 ft with rollator; 03/21/21: 712f c rollator, 04/24/21: 7571fc rollator; 07/06/2021=900 feet; 08/22/2021= 935 feet with use of Rollator 44/20: 940 ft 11/28/21: 1055 feet with rollator    Time 12    Period Weeks    Status Achieved    Target Date 01/04/22      PT LONG TERM GOAL #5   Title Patient will increase glute medius strength on Left LE from 3-/5 to 4/5 to improve stability, gait mechanics, and functional strength.    Baseline 10/31: 3-/5; 07/06/2021= 3-/5; 08/22/2021=3-/5 unable to achieve full ROM yet able to withstand some resistance with testing 4/20: unable to obtainfull ROM against gravity 6/6: unable to move against gravity    Time 12    Period Weeks    Status On-going    Target Date 01/04/22      PT LONG TERM GOAL #6   Title Patient will increase Left single leg stance time to 15 seconds or greater to increase safety in shower and independence with ADLs.    Baseline 10/31; 1.83 sec without UE support; 08/22/2021=2-3  sec  on left; 6 sec on right 4/20: RLE 10 seconds LLE unable to perform 6/6: R LE 4 seconds during BERG    Time 12    Period Weeks    Status On-going    Target Date 01/04/22      PT LONG TERM GOAL #7   Title Patient will safely negotiate 13 steps with handrails to safely enter/exit mothers house.    Baseline 4/20: very challenging 6/6: able to copmlete safely even managing dogs per pt report    Time 12    Period Weeks    Status New    Target Date 01/04/22                   Plan - 11/30/21 1311     Clinical Impression Statement Pt presents with good motivation for copmletion of PT activities. Pt continues with L hip strenghtening and endurance activities. Pt tolerates well but still has difficulty with L SL  stance stability and demonstrates hip drop with ambulation activities. Pt challenged with left hip stability and enduracne exercises this session. Pt will continue to benefit from skilled PT intervention in order to imrove strength, ambulatory capacity, endurance, balance,  and QOL.    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    Rehab Potential Fair    PT Frequency 2x / week    PT Duration 12 weeks    PT Treatment/Interventions ADLs/Self Care Home Management;Aquatic Therapy;Canalith Repostioning;Biofeedback;Cryotherapy;Ultrasound;Traction;Moist Heat;Electrical Stimulation;DME Instruction;Gait training;Therapeutic exercise;Therapeutic activities;Functional mobility training;Stair training;Balance training;Neuromuscular re-education;Patient/family education;Manual techniques;Orthotic Fit/Training;Compression bandaging;Passive range of motion;Vestibular;Taping;Splinting;Energy conservation;Dry needling;Visual/perceptual remediation/compensation    PT Next Visit Plan Continue with progessive balance training, Progressive LE strenthening as appropriate.    PT Home Exercise Plan Access Code: [email or web address]: [email or web address]  Added Bridges with BTB and encouraged more walking around the home during commerical breaks when watching TV.    Consulted and Agree with Plan of Care Patient  Patient will benefit from skilled therapeutic intervention in order to improve the following deficits and impairments:  Abnormal gait, Cardiopulmonary status limiting activity, Decreased activity tolerance, Decreased coordination, Decreased balance, Decreased endurance, Decreased mobility, Decreased strength, Decreased range of motion, Difficulty walking, Impaired flexibility, Impaired sensation, Postural dysfunction, Improper body mechanics  Visit Diagnosis: Unsteadiness on feet  Difficulty in walking, not elsewhere classified  Abnormality of gait and mobility  Muscle weakness (generalized)     Problem List Patient Active Problem List   Diagnosis Date Noted   Persistent atrial fibrillation (HCC)    Graves disease    Abdominal bloating    Atrial fibrillation with RVR ([place]) 10/01/2019   Hyperthyroidism    Atrial fibrillation with rapid ventricular response ([place]) 09/30/2019   Leg edema    Left leg cellulitis    Special screening for malignant neoplasms, colon    Polyp of sigmoid colon    Benign neoplasm of ascending colon    Rectal polyp    Herniated nucleus pulposus, L5-S1 11/09/2015   Low back pain 10/25/2015   Herniated nucleus pulposus, C5-6 right 10/06/2015   Dysesthesia 09/16/2015   Spasticity 09/16/2015   Unsteady gait 09/16/2015   Multiple sclerosis ([place]) 06/08/2011    Particia Lather, PT 12/01/2021, 8:00 AM  [place], [place], [address] Phone: [phone]   Fax:  [phone]  Name: Dustin Gray MRN: [id number] Date of Birth: 1956/01/06

## 2021-12-01 ENCOUNTER — Encounter: Payer: Self-pay | Admitting: Physical Therapy

## 2021-12-04 ENCOUNTER — Telehealth: Payer: Self-pay | Admitting: Cardiology

## 2021-12-04 MED ORDER — PROPRANOLOL HCL ER 80 MG PO CP24: 80.0000 mg | ORAL_CAPSULE | Freq: Two times a day (BID) | ORAL | 2 refills | Status: DC

## 2021-12-04 NOTE — Telephone Encounter (Signed)
Refill sent in as requested. 

## 2021-12-04 NOTE — Telephone Encounter (Signed)
*  STAT* If patient is at the pharmacy, call can be transferred to refill team.   1. Which medications need to be refilled? (please list name of each medication and dose if known)   propranolol ER (INDERAL LA) 80 MG 24 hr capsule    2. Which pharmacy/location (including street and city if local pharmacy) is medication to be sent to?  [place], [place] 3. Do they need a 30 day or 90 day supply?  90 day

## 2021-12-05 ENCOUNTER — Encounter: Payer: Medicare PPO | Admitting: Occupational Therapy

## 2021-12-06 ENCOUNTER — Encounter: Payer: Medicare PPO | Admitting: Occupational Therapy

## 2021-12-06 ENCOUNTER — Ambulatory Visit: Payer: Medicare PPO

## 2021-12-06 DIAGNOSIS — I890 Lymphedema, not elsewhere classified: Secondary | ICD-10-CM | POA: Diagnosis not present

## 2021-12-06 DIAGNOSIS — R269 Unspecified abnormalities of gait and mobility: Secondary | ICD-10-CM | POA: Diagnosis not present

## 2021-12-06 DIAGNOSIS — R2681 Unsteadiness on feet: Secondary | ICD-10-CM | POA: Diagnosis not present

## 2021-12-06 DIAGNOSIS — M6281 Muscle weakness (generalized): Secondary | ICD-10-CM

## 2021-12-06 DIAGNOSIS — R2689 Other abnormalities of gait and mobility: Secondary | ICD-10-CM | POA: Diagnosis not present

## 2021-12-06 DIAGNOSIS — R262 Difficulty in walking, not elsewhere classified: Secondary | ICD-10-CM

## 2021-12-06 DIAGNOSIS — R278 Other lack of coordination: Secondary | ICD-10-CM | POA: Diagnosis not present

## 2021-12-06 NOTE — Therapy (Signed)
[place] [facility] SERVICES [address] [place], [place], [address] Phone: [phone]   Fax:  [phone]  Physical Therapy Treatment  Patient Details  Name: Dustin Gray MRN: [id number] Date of Birth: [birth year] No data recorded  Encounter Date: 12/06/2021   PT End of Session - 12/06/21 1037     Visit Number 73    Number of Visits 42    Date for PT Re-Evaluation 01/04/22    Authorization Type [place] primary    Authorization Time Period 08/23/21-11/23/21 24 visits    Authorization - Number of Visits 24    Progress Note Due on Visit 35    PT Start Time 1015    PT Stop Time 1100    PT Time Calculation (min) 45 min    Equipment Utilized During Treatment Gait belt    Activity Tolerance Patient tolerated treatment well;No increased pain    Behavior During Therapy WFL for tasks assessed/performed             Past Medical History:  Diagnosis Date   Abscess    groin   Arthritis    lower spine   Erectile dysfunction    Low testosterone    Lumbar herniated disc    L5   Multiple sclerosis ([place])    Staph aureus infection     Past Surgical History:  Procedure Laterality Date   CARDIOVERSION N/A 11/22/2021   Procedure: CARDIOVERSION;  Surgeon: [name], MD;  Location: [facility];  Service: Cardiovascular;  Laterality: N/A;   COLONOSCOPY WITH PROPOFOL N/A 07/04/2016   Procedure: COLONOSCOPY WITH PROPOFOL;  Surgeon: [name], MD;  Location: [place];  Service: Endoscopy;  Laterality: N/A;   [place]   POLYPECTOMY  07/04/2016   Procedure: POLYPECTOMY;  Surgeon: [name], MD;  Location: [place];  Service: Endoscopy;;   TONSILLECTOMY      There were no vitals filed for this visit.   Subjective Assessment - 12/06/21 1709     Subjective Pt reports he feels he is making some progress and leg is doing well today.    Pertinent History Patient  presents to physical therapy for unsteadiness, walking instability, fatigue. His PMH includes paroxysmal a fib, MS, lymphedema, hypothyroidism, Graves Disease, arthritis, lumbar herniated disc (L5), depression and neuropathy. Patient first developed symptoms of MS in 2005 and was diagnosed in 2006. Patient has done PT in the past, but hasn't been seen since 2020 at [facility]. Has not been doing his exercises in the past year. Walk outside to garage to ride lawnmower, walks in house. 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 a couple hours    How long can you stand comfortably? no more than 10 minutes before pain, have to steady self immediately    How long can you walk comfortably? has to use rollator. can walk in a store with a cart    Patient Stated Goals want to get stronger and feel mor comfortable walking around. Patient would like to continue strengthening hips and improve endurance and ability to pace self for safety while walking.    Currently in Pain? No/denies             INTERVENTIONS:   Therex:   Forward stepping over obstacles in // bars- including  3 1/2 foams- down and back x 10 times- Min UE support -focusing in increased step length  Side stepping over obstacles in  / bars - including 3 1/2 foams and orange hurdle x 10  Hip march into donkey kick x 5 reps x 3 sets - difficulty with flexion left LE with some intermittent hyperextension.   Standing hip ER into IR x 5 reps x 3 sets (minimal to no IR with Left LE)    Reverse lunge BLE x 15 reps (VC for slow and controlled mobility)   Education provided throughout session via VC/TC and demonstration to facilitate movement at target joints and correct muscle activation for all testing and exercises performed. Rationale for Evaluation and Treatment Rehabilitation                             PT Education - 12/06/21 1036     Education Details Exercise technique    Person(s) Educated Patient    Methods Explanation;Demonstration;Tactile cues;Verbal cues    Comprehension Verbalized understanding;Returned demonstration;Verbal cues required;Need further instruction;Tactile cues required              PT Short Term Goals - 08/22/21 1358       PT SHORT TERM GOAL #1   Title Patient will be independent in home exercise program to improve strength/mobility for better functional independence with ADLs.    Baseline 8/9: HEP to be given next session, 10/31: patient reports compliance with HEP and would like progressions added next session. 08/22/2021=Patient verbalized that he is walking and doing his LE exercises with no questions at this time.    Time 4    Period Weeks    Status Achieved    Target Date 08/16/21               PT Long Term Goals - 11/28/21 1321       PT LONG TERM GOAL #1   Title Patient will increase FOTO score to equal to or greater than 70%  to demonstrate statistically significant improvement in mobility and quality of life.    Baseline 8/9: 64%; risk adjusted 34%; 03/21/21 FOTO: 45; 04/24/21 FOTO: 49% ; 08/22/2021= 56% 4/20: 60% 11/28/21: 63%    Time 12    Period Weeks    Status On-going    Target Date 01/04/22      PT LONG TERM GOAL #2   Title Patient will increase Berg Balance score by > 6 points to demonstrate decreased fall risk during functional activities.    Baseline 4/20: 43/56 6/6: 48/56    Time 12    Period Weeks    Status On-going    Target Date 01/04/22      PT LONG TERM GOAL #3   Title Patient will increase 10 meter walk test to >1.69ms as to improve gait speed for better community ambulation and to reduce fall risk.    Baseline 8/9: 0.65 m/s with rollator; 10/31: 0.68 m/s with rollator. 07/06/2021= 0.71 m/s using rollator; 08/22/2021= 0.94 m/s using rollator. 4/20: 1.1 m/s with rollator     Time 12    Period Weeks    Status Achieved    Target Date 10/11/21      PT LONG TERM GOAL #4   Title Patient will increase six minute walk test distance to >1000 for progression to community ambulator and improve gait ability    Baseline 8/9: 505 ft with rollator; 03/21/21: 7786fc rollator, 04/24/21:  766f c rollator; 07/06/2021=900 feet; 08/22/2021= 935 feet with use of Rollator 44/20: 940 ft 11/28/21: 1055 feet with rollator    Time 12    Period Weeks    Status Achieved    Target Date 01/04/22      PT LONG TERM GOAL #5   Title Patient will increase glute medius strength on Left LE from 3-/5 to 4/5 to improve stability, gait mechanics, and functional strength.    Baseline 10/31: 3-/5; 07/06/2021= 3-/5; 08/22/2021=3-/5 unable to achieve full ROM yet able to withstand some resistance with testing 4/20: unable to obtainfull ROM against gravity 6/6: unable to move against gravity    Time 12    Period Weeks    Status On-going    Target Date 01/04/22      PT LONG TERM GOAL #6   Title Patient will increase Left single leg stance time to 15 seconds or greater to increase safety in shower and independence with ADLs.    Baseline 10/31; 1.83 sec without UE support; 08/22/2021=2-3  sec  on left; 6 sec on right 4/20: RLE 10 seconds LLE unable to perform 6/6: R LE 4 seconds during BERG    Time 12    Period Weeks    Status On-going    Target Date 01/04/22      PT LONG TERM GOAL #7   Title Patient will safely negotiate 13 steps with handrails to safely enter/exit mothers house.    Baseline 4/20: very challenging 6/6: able to copmlete safely even managing dogs per pt report    Time 12    Period Weeks    Status New    Target Date 01/04/22                   Plan - 12/06/21 1037     Clinical Impression Statement Patient presents with good motivation for today's session- focusing on Standing LE activities. He was able to increase his step height and length with activities today without LOB.  He did experience some left knee hyperextension with standing during hip exercises. Also presents with difficulty performing Hip ER/IR (specifically IR) today but good effort and Patient will continue to benefit from skilled physical therapy intervention in order to improve his lower extremity strength, stability, mobility, and quality of life.    Personal Factors and Comorbidities Age;Comorbidity 3+;Fitness;Past/Current Experience;Time since onset of injury/illness/exacerbation    Comorbidities aroxysmal a fib, MS, lymphedema, hypothyroidism, Graves Disease, arthritis, lumbar herniated disc (L5), depression and neuropathy    Examination-Activity Limitations Bed Mobility;Bend;Caring for Others;Carry;Reach Overhead;Locomotion Level;Lift;Hygiene/Grooming;Dressing;Squat;Stairs;Stand;Toileting;Transfers    Examination-Participation Restrictions Cleaning;Community Activity;Laundry;Occupation;Shop;Volunteer;Yard Work    SMerchant navy officerEvolving/Moderate complexity    Rehab Potential Fair    PT Frequency 2x / week    PT Duration 12 weeks    PT Treatment/Interventions ADLs/Self Care Home Management;Aquatic Therapy;Canalith Repostioning;Biofeedback;Cryotherapy;Ultrasound;Traction;Moist Heat;Electrical Stimulation;DME Instruction;Gait training;Therapeutic exercise;Therapeutic activities;Functional mobility training;Stair training;Balance training;Neuromuscular re-education;Patient/family education;Manual techniques;Orthotic Fit/Training;Compression bandaging;Passive range of motion;Vestibular;Taping;Splinting;Energy conservation;Dry needling;Visual/perceptual remediation/compensation    PT Next Visit Plan Continue with progessive balance training, Progressive LE strenthening as appropriate.    PT Home Exercise Plan Access Code: [email or web address]: [email or web address]  Added Bridges with BTB and encouraged more walking around the home during commerical breaks when watching TV.     Consulted and Agree with Plan of Care Patient             Patient will benefit from skilled therapeutic intervention in order to improve the following deficits and impairments:  Abnormal gait, Cardiopulmonary  status limiting activity, Decreased activity tolerance, Decreased coordination, Decreased balance, Decreased endurance, Decreased mobility, Decreased strength, Decreased range of motion, Difficulty walking, Impaired flexibility, Impaired sensation, Postural dysfunction, Improper body mechanics  Visit Diagnosis: Unsteadiness on feet  Difficulty in walking, not elsewhere classified  Abnormality of gait and mobility  Muscle weakness (generalized)  Other abnormalities of gait and mobility  Other lack of coordination  Lymphedema, not elsewhere classified     Problem List Patient Active Problem List   Diagnosis Date Noted   Persistent atrial fibrillation (HCC)    Graves disease    Abdominal bloating    Atrial fibrillation with RVR ([place]) 10/01/2019   Hyperthyroidism    Atrial fibrillation with rapid ventricular response ([place]) 09/30/2019   Leg edema    Left leg cellulitis    Special screening for malignant neoplasms, colon    Polyp of sigmoid colon    Benign neoplasm of ascending colon    Rectal polyp    Herniated nucleus pulposus, L5-S1 11/09/2015   Low back pain 10/25/2015   Herniated nucleus pulposus, C5-6 right 10/06/2015   Dysesthesia 09/16/2015   Spasticity 09/16/2015   Unsteady gait 09/16/2015   Multiple sclerosis ([place]) 06/08/2011    Lewis Moccasin, PT 12/06/2021, 5:24 PM  [place] [facility] SERVICES [address] [place], [place], [address] Phone: [phone]   Fax:  [phone]  Name: CHASON MCIVER MRN: [id number] Date of Birth: 1955/08/11

## 2021-12-11 ENCOUNTER — Ambulatory Visit: Payer: Medicare PPO | Admitting: Physical Therapy

## 2021-12-13 ENCOUNTER — Ambulatory Visit: Payer: Medicare PPO | Admitting: Physical Therapy

## 2021-12-13 ENCOUNTER — Encounter: Payer: Self-pay | Admitting: Physical Therapy

## 2021-12-13 DIAGNOSIS — M6281 Muscle weakness (generalized): Secondary | ICD-10-CM | POA: Diagnosis not present

## 2021-12-13 DIAGNOSIS — R2681 Unsteadiness on feet: Secondary | ICD-10-CM

## 2021-12-13 DIAGNOSIS — R278 Other lack of coordination: Secondary | ICD-10-CM | POA: Diagnosis not present

## 2021-12-13 DIAGNOSIS — R269 Unspecified abnormalities of gait and mobility: Secondary | ICD-10-CM

## 2021-12-13 DIAGNOSIS — R2689 Other abnormalities of gait and mobility: Secondary | ICD-10-CM | POA: Diagnosis not present

## 2021-12-13 DIAGNOSIS — I890 Lymphedema, not elsewhere classified: Secondary | ICD-10-CM | POA: Diagnosis not present

## 2021-12-13 DIAGNOSIS — R262 Difficulty in walking, not elsewhere classified: Secondary | ICD-10-CM | POA: Diagnosis not present

## 2021-12-13 NOTE — Therapy (Signed)
OUTPATIENT PHYSICAL THERAPY TREATMENT NOTE   Patient Name: Dustin Gray MRN: [id number] DOB:1956-04-07, 66 y.o., male 6 Date: 12/13/2021  PCP: [name], MD REFERRING PROVIDER: [name], MD   PT End of Session - 12/13/21 1339     Visit Number 107    Number of Visits 63    Date for PT Re-Evaluation 01/04/22    Authorization Type [place] primary    Authorization Time Period 08/23/21-11/23/21 24 visits    Authorization - Number of Visits 24    Progress Note Due on Visit 60    PT Start Time 1343    PT Stop Time 1428    PT Time Calculation (min) 45 min    Equipment Utilized During Treatment Gait belt    Activity Tolerance Patient tolerated treatment well;No increased pain    Behavior During Therapy WFL for tasks assessed/performed             Past Medical History:  Diagnosis Date   Abscess    groin   Arthritis    lower spine   Erectile dysfunction    Low testosterone    Lumbar herniated disc    L5   Multiple sclerosis ([place])    Staph aureus infection    Past Surgical History:  Procedure Laterality Date   CARDIOVERSION N/A 11/22/2021   Procedure: CARDIOVERSION;  Surgeon: [name], MD;  Location: [facility];  Service: Cardiovascular;  Laterality: N/A;   COLONOSCOPY WITH PROPOFOL N/A 07/04/2016   Procedure: COLONOSCOPY WITH PROPOFOL;  Surgeon: [name], MD;  Location: [place];  Service: Endoscopy;  Laterality: N/A;   [place]   POLYPECTOMY  07/04/2016   Procedure: POLYPECTOMY;  Surgeon: [name], MD;  Location: [place];  Service: Endoscopy;;   TONSILLECTOMY     Patient Active Problem List   Diagnosis Date Noted   Persistent atrial fibrillation ([place])    Graves disease    Abdominal bloating    Atrial fibrillation with RVR ([place]) 10/01/2019   Hyperthyroidism    Atrial fibrillation with rapid ventricular response ([place]) 09/30/2019   Leg edema    Left leg  cellulitis    Special screening for malignant neoplasms, colon    Polyp of sigmoid colon    Benign neoplasm of ascending colon    Rectal polyp    Herniated nucleus pulposus, L5-S1 11/09/2015   Low back pain 10/25/2015   Herniated nucleus pulposus, C5-6 right 10/06/2015   Dysesthesia 09/16/2015   Spasticity 09/16/2015   Unsteady gait 09/16/2015   Multiple sclerosis ([place]) 06/08/2011     REFERRING DIAG: R26.81 (ICD-10-CM) - Unsteadiness on feet   THERAPY DIAG:  Unsteadiness on feet  Difficulty in walking, not elsewhere classified  Abnormality of gait and mobility  Muscle weakness (generalized)  Rationale for Evaluation and Treatment Rehabilitation  PERTINENT HISTORY: Patient presents to physical therapy for unsteadiness, walking instability, fatigue. His PMH includes paroxysmal a fib, MS, lymphedema, hypothyroidism, Graves Disease, arthritis, lumbar herniated disc (L5), depression and neuropathy. Patient first developed symptoms of MS in 2005 and was diagnosed in 2006. Patient has done PT in the past, but hasn't been seen since 2020 at [facility]. Has not been doing his exercises in the past year. Walk outside to garage to ride lawnmower, walks in house. Retired Automotive engineer. Has a rollator at home but doesn't use it in the house   PRECAUTIONS: Fall  SUBJECTIVE: Pt reports no new falls  since lst visit. Pt is now baby sitting his DTR's dog   PAIN:  Are you having pain? No     TODAY'S TREATMENT: 12/13/21  Therex    Nustep level 2 x 5 min- NO CHARGE    Seated hip march with hurdle step over and back (3lb on right and no weight on left, hurdle on side to lower height) 2 x 10 reps ea LE    L LE side  step taps 2*10  - U UE assist  R LE side step taps 2*10 - UE assist    Hip hiking standing on 1 inch step 2 x 10 with UE A for balance -L LE on step to activate glute medius    -Sit to stand using 1 inch step with 3 degree wedge toward contralateral LE  2*8 reps -to  challenge L LE stability and use with everyday tasks     Education provided throughout session via VC/TC and demonstration to facilitate movement at target joints and correct muscle activation for all testing and exercises performed.    Pt required occasional rest breaks due fatigue, PT was quick to ask when pt appeared to be fatiguing in order to prevent excessive fatigue.    Patient required brief rest breaks to complete all activities.   Rationale for Evaluation and Treatment Rehabilitation             PATIENT EDUCATION: Education details: exercise technique  Person educated: Patient Education method: Customer service manager Education comprehension: verbalized understanding   HOME EXERCISE PROGRAM: No changes this session   PT Short Term Goals -       PT SHORT TERM GOAL #1   Title Patient will be independent in home exercise program to improve strength/mobility for better functional independence with ADLs.    Baseline 8/9: HEP to be given next session, 10/31: patient reports compliance with HEP and would like progressions added next session. 08/22/2021=Patient verbalized that he is walking and doing his LE exercises with no questions at this time.    Time 4    Period Weeks    Status Achieved    Target Date 08/16/21              PT Long Term Goals       PT LONG TERM GOAL #1   Title Patient will increase FOTO score to equal to or greater than 70%  to demonstrate statistically significant improvement in mobility and quality of life.    Baseline 8/9: 64%; risk adjusted 34%; 03/21/21 FOTO: 45; 04/24/21 FOTO: 49% ; 08/22/2021= 56% 4/20: 60% 11/28/21: 63%    Time 12    Period Weeks    Status On-going    Target Date 01/04/22      PT LONG TERM GOAL #2   Title Patient will increase Berg Balance score by > 6 points to demonstrate decreased fall risk during functional activities.    Baseline 4/20: 43/56 6/6: 48/56    Time 12    Period Weeks    Status On-going     Target Date 01/04/22      PT LONG TERM GOAL #3   Title Patient will increase 10 meter walk test to >1.80ms as to improve gait speed for better community ambulation and to reduce fall risk.    Baseline 8/9: 0.65 m/s with rollator; 10/31: 0.68 m/s with rollator. 07/06/2021= 0.71 m/s using rollator; 08/22/2021= 0.94 m/s using rollator. 4/20: 1.1 m/s with rollator    Time 12  Period Weeks    Status Achieved    Target Date 10/11/21      PT LONG TERM GOAL #4   Title Patient will increase six minute walk test distance to >1000 for progression to community ambulator and improve gait ability    Baseline 8/9: 505 ft with rollator; 03/21/21: 735f c rollator, 04/24/21: 7534fc rollator; 07/06/2021=900 feet; 08/22/2021= 935 feet with use of Rollator 44/20: 940 ft 11/28/21: 1055 feet with rollator    Time 12    Period Weeks    Status Achieved    Target Date 01/04/22      PT LONG TERM GOAL #5   Title Patient will increase glute medius strength on Left LE from 3-/5 to 4/5 to improve stability, gait mechanics, and functional strength.    Baseline 10/31: 3-/5; 07/06/2021= 3-/5; 08/22/2021=3-/5 unable to achieve full ROM yet able to withstand some resistance with testing 4/20: unable to obtainfull ROM against gravity 6/6: unable to move against gravity    Time 12    Period Weeks    Status On-going    Target Date 01/04/22      PT LONG TERM GOAL #6   Title Patient will increase Left single leg stance time to 15 seconds or greater to increase safety in shower and independence with ADLs.    Baseline 10/31; 1.83 sec without UE support; 08/22/2021=2-3  sec  on left; 6 sec on right 4/20: RLE 10 seconds LLE unable to perform 6/6: R LE 4 seconds during BERG    Time 12    Period Weeks    Status On-going    Target Date 01/04/22      PT LONG TERM GOAL #7   Title Patient will safely negotiate 13 steps with handrails to safely enter/exit mothers house.    Baseline 4/20: very challenging 6/6: able to copmlete safely even  managing dogs per pt report    Time 12    Period Weeks    Status New    Target Date 01/04/22              Plan - 12/13/21 1340     Clinical Impression Statement Continued with current plan of care as laid out in evaluation and recent prior sessions. Pt remains motivated to advance progress toward goals in order to maximize independence and safety at home. Pt requires high level assistance and cuing for completion of exercises in order to provide adequate level of stimulation and perturbation.  Pt continues to demonstrate progress toward goals AEB progression of some interventions this date either in volume or intensity.     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    Rehab Potential Fair    PT Frequency 2x / week    PT Duration 12 weeks    PT Treatment/Interventions ADLs/Self Care Home Management;Aquatic Therapy;Canalith Repostioning;Biofeedback;Cryotherapy;Ultrasound;Traction;Moist Heat;Electrical Stimulation;DME Instruction;Gait training;Therapeutic exercise;Therapeutic activities;Functional mobility training;Stair training;Balance training;Neuromuscular re-education;Patient/family education;Manual techniques;Orthotic Fit/Training;Compression bandaging;Passive range of motion;Vestibular;Taping;Splinting;Energy conservation;Dry needling;Visual/perceptual remediation/compensation    PT Next Visit Plan  Continue with progessive balance training, Progressive LE strenthening as appropriate.    PT Home Exercise Plan Access Code: [email or web address]: [email or web address]  Added Bridges with BTB and encouraged more walking around the  home during commerical breaks when watching TV.    Consulted and Agree with Plan of Care Patient               Particia Lather, PT 12/13/2021, 1:41 PM

## 2021-12-13 NOTE — Progress Notes (Deleted)
OUTPATIENT PHYSICAL THERAPY TREATMENT NOTE   Patient Name: Dustin Gray MRN: [id number] DOB:09-Apr-1956, 66 y.o., male Today's Date: 12/13/2021  PCP: *** REFERRING PROVIDER: [name], MD    PT End of Session - 12/13/21 1339     Visit Number 68    Number of Visits 7    Date for PT Re-Evaluation 01/04/22    Authorization Type [place] primary    Authorization Time Period 08/23/21-11/23/21 24 visits    Authorization - Number of Visits 24    Progress Note Due on Visit 67    PT Start Time 1343    PT Stop Time 1428    PT Time Calculation (min) 45 min    Equipment Utilized During Treatment Gait belt    Activity Tolerance Patient tolerated treatment well;No increased pain    Behavior During Therapy WFL for tasks assessed/performed             Past Medical History:  Diagnosis Date   Abscess    groin   Arthritis    lower spine   Erectile dysfunction    Low testosterone    Lumbar herniated disc    L5   Multiple sclerosis ([place])    Staph aureus infection    Past Surgical History:  Procedure Laterality Date   CARDIOVERSION N/A 11/22/2021   Procedure: CARDIOVERSION;  Surgeon: [name], MD;  Location: [facility];  Service: Cardiovascular;  Laterality: N/A;   COLONOSCOPY WITH PROPOFOL N/A 07/04/2016   Procedure: COLONOSCOPY WITH PROPOFOL;  Surgeon: [name], MD;  Location: [place];  Service: Endoscopy;  Laterality: N/A;   [place]   POLYPECTOMY  07/04/2016   Procedure: POLYPECTOMY;  Surgeon: [name], MD;  Location: [place];  Service: Endoscopy;;   TONSILLECTOMY     Patient Active Problem List   Diagnosis Date Noted   Persistent atrial fibrillation ([place])    Graves disease    Abdominal bloating    Atrial fibrillation with RVR ([place]) 10/01/2019   Hyperthyroidism    Atrial fibrillation with rapid ventricular response ([place]) 09/30/2019   Leg edema    Left leg cellulitis     Special screening for malignant neoplasms, colon    Polyp of sigmoid colon    Benign neoplasm of ascending colon    Rectal polyp    Herniated nucleus pulposus, L5-S1 11/09/2015   Low back pain 10/25/2015   Herniated nucleus pulposus, C5-6 right 10/06/2015   Dysesthesia 09/16/2015   Spasticity 09/16/2015   Unsteady gait 09/16/2015   Multiple sclerosis ([place]) 06/08/2011    REFERRING DIAG: R26.81 (ICD-10-CM) - Unsteadiness on feet   THERAPY DIAG:  Unsteadiness on feet  Difficulty in walking, not elsewhere classified  Abnormality of gait and mobility  Muscle weakness (generalized)  Rationale for Evaluation and Treatment Rehabilitation  PERTINENT HISTORY: Patient presents to physical therapy for unsteadiness, walking instability, fatigue. His PMH includes paroxysmal a fib, MS, lymphedema, hypothyroidism, Graves Disease, arthritis, lumbar herniated disc (L5), depression and neuropathy. Patient first developed symptoms of MS in 2005 and was diagnosed in 2006. Patient has done PT in the past, but hasn't been seen since 2020 at [facility]. Has not been doing his exercises in the past year. Walk outside to garage to ride lawnmower, walks in house. 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 Pt is now baby sitting his DTR's dog   PAIN:  Are you having pain? No     TODAY'S TREATMENT: 12/13/21  Therex    Nustep level 2 x 5 min- NO CHARGE    Seated hip march with hurdle step over and back (3lb on right and no weight on left, hurdle on side to lower height) 2 x 10 reps ea LE     L LE side  step taps 2*10  - U UE assist  R LE side step taps 2*10 - UE assist    Hip hiking standing on 1 inch step 2 x 10 with UE A for balance -L LE on step to activate glute medius    -Sit to stand using 1 inch step with 3 degree wedge toward contralateral LE  2*8 reps -to challenge L LE  stability and use with everyday tasks     Education provided throughout session via VC/TC and demonstration to facilitate movement at target joints and correct muscle activation for all testing and exercises performed.      Patient required brief rest breaks to complete all activities.   Rationale for Evaluation and Treatment Rehabilitation             PATIENT EDUCATION: Education details: exercise technique  Person educated: Patient Education method: Customer service manager Education comprehension: verbalized understanding   HOME EXERCISE PROGRAM: No changes this session   PT Short Term Goals -       PT SHORT TERM GOAL #1   Title Patient will be independent in home exercise program to improve strength/mobility for better functional independence with ADLs.    Baseline 8/9: HEP to be given next session, 10/31: patient reports compliance with HEP and would like progressions added next session. 08/22/2021=Patient verbalized that he is walking and doing his LE exercises with no questions at this time.    Time 4    Period Weeks    Status Achieved    Target Date 08/16/21              PT Long Term Goals       PT LONG TERM GOAL #1   Title Patient will increase FOTO score to equal to or greater than 70%  to demonstrate statistically significant improvement in mobility and quality of life.    Baseline 8/9: 64%; risk adjusted 34%; 03/21/21 FOTO: 45; 04/24/21 FOTO: 49% ; 08/22/2021= 56% 4/20: 60% 11/28/21: 63%    Time 12    Period Weeks    Status On-going    Target Date 01/04/22      PT LONG TERM GOAL #2   Title Patient will increase Berg Balance score by > 6 points to demonstrate decreased fall risk during functional activities.    Baseline 4/20: 43/56 6/6: 48/56    Time 12    Period Weeks    Status On-going    Target Date 01/04/22      PT LONG TERM GOAL #3   Title Patient will increase 10 meter walk test to >1.72ms as to improve gait speed for better community  ambulation and to reduce fall risk.    Baseline 8/9: 0.65 m/s with rollator; 10/31: 0.68 m/s with rollator. 07/06/2021= 0.71 m/s using rollator; 08/22/2021= 0.94 m/s using rollator. 4/20: 1.1 m/s with rollator    Time 12    Period Weeks    Status Achieved    Target Date 10/11/21      PT LONG TERM GOAL #4  Title Patient will increase six minute walk test distance to >1000 for progression to community ambulator and improve gait ability    Baseline 8/9: 505 ft with rollator; 03/21/21: 739f c rollator, 04/24/21: 7583fc rollator; 07/06/2021=900 feet; 08/22/2021= 935 feet with use of Rollator 44/20: 940 ft 11/28/21: 1055 feet with rollator    Time 12    Period Weeks    Status Achieved    Target Date 01/04/22      PT LONG TERM GOAL #5   Title Patient will increase glute medius strength on Left LE from 3-/5 to 4/5 to improve stability, gait mechanics, and functional strength.    Baseline 10/31: 3-/5; 07/06/2021= 3-/5; 08/22/2021=3-/5 unable to achieve full ROM yet able to withstand some resistance with testing 4/20: unable to obtainfull ROM against gravity 6/6: unable to move against gravity    Time 12    Period Weeks    Status On-going    Target Date 01/04/22      PT LONG TERM GOAL #6   Title Patient will increase Left single leg stance time to 15 seconds or greater to increase safety in shower and independence with ADLs.    Baseline 10/31; 1.83 sec without UE support; 08/22/2021=2-3  sec  on left; 6 sec on right 4/20: RLE 10 seconds LLE unable to perform 6/6: R LE 4 seconds during BERG    Time 12    Period Weeks    Status On-going    Target Date 01/04/22      PT LONG TERM GOAL #7   Title Patient will safely negotiate 13 steps with handrails to safely enter/exit mothers house.    Baseline 4/20: very challenging 6/6: able to copmlete safely even managing dogs per pt report    Time 12    Period Weeks    Status New    Target Date 01/04/22              Plan - 12/13/21 1340     Clinical  Impression Statement Plan    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    Rehab Potential Fair    PT Frequency 2x / week    PT Duration 12 weeks    PT Treatment/Interventions ADLs/Self Care Home Management;Aquatic Therapy;Canalith Repostioning;Biofeedback;Cryotherapy;Ultrasound;Traction;Moist Heat;Electrical Stimulation;DME Instruction;Gait training;Therapeutic exercise;Therapeutic activities;Functional mobility training;Stair training;Balance training;Neuromuscular re-education;Patient/family education;Manual techniques;Orthotic Fit/Training;Compression bandaging;Passive range of motion;Vestibular;Taping;Splinting;Energy conservation;Dry needling;Visual/perceptual remediation/compensation    PT Next Visit Plan Continue with progessive balance training, Progressive LE strenthening as appropriate.    PT Home Exercise Plan Access Code: [email or web address]: [email or web address]  Added Bridges with BTB and encouraged more walking around the home during commerical breaks when watching TV.    Consulted and Agree with Plan of Care Patient               ChParticia LatherPT 12/13/2021, 1:41 PM

## 2021-12-18 ENCOUNTER — Telehealth: Payer: Self-pay | Admitting: Cardiology

## 2021-12-18 ENCOUNTER — Ambulatory Visit: Payer: Medicare PPO | Admitting: Physical Therapy

## 2021-12-18 MED ORDER — DILTIAZEM HCL ER 120 MG PO CP12: 120.0000 mg | ORAL_CAPSULE | Freq: Two times a day (BID) | ORAL | 0 refills | Status: DC

## 2021-12-18 NOTE — Progress Notes (Signed)
Requested PCP notes, labs and EKG.

## 2021-12-18 NOTE — Progress Notes (Signed)
Requested PCP notes, EKG and labs.

## 2021-12-21 ENCOUNTER — Ambulatory Visit: Payer: Medicare PPO | Admitting: Physical Therapy

## 2021-12-21 ENCOUNTER — Encounter: Payer: Self-pay | Admitting: Physical Therapy

## 2021-12-21 DIAGNOSIS — R262 Difficulty in walking, not elsewhere classified: Secondary | ICD-10-CM

## 2021-12-21 DIAGNOSIS — R278 Other lack of coordination: Secondary | ICD-10-CM | POA: Diagnosis not present

## 2021-12-21 DIAGNOSIS — M6281 Muscle weakness (generalized): Secondary | ICD-10-CM | POA: Diagnosis not present

## 2021-12-21 DIAGNOSIS — I890 Lymphedema, not elsewhere classified: Secondary | ICD-10-CM | POA: Diagnosis not present

## 2021-12-21 DIAGNOSIS — R269 Unspecified abnormalities of gait and mobility: Secondary | ICD-10-CM | POA: Diagnosis not present

## 2021-12-21 DIAGNOSIS — R2689 Other abnormalities of gait and mobility: Secondary | ICD-10-CM | POA: Diagnosis not present

## 2021-12-21 DIAGNOSIS — R2681 Unsteadiness on feet: Secondary | ICD-10-CM | POA: Diagnosis not present
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
## 2021-12-22 ENCOUNTER — Encounter: Payer: Self-pay | Admitting: Cardiology

## 2021-12-22 ENCOUNTER — Ambulatory Visit: Payer: Medicare PPO | Admitting: Cardiology

## 2021-12-22 VITALS — BP 136/80 | HR 59 | Ht 74.0 in | Wt 261.6 lb

## 2021-12-22 DIAGNOSIS — I4819 Other persistent atrial fibrillation: Secondary | ICD-10-CM | POA: Diagnosis not present

## 2021-12-22 DIAGNOSIS — E0590 Thyrotoxicosis, unspecified without thyrotoxic crisis or storm: Secondary | ICD-10-CM | POA: Diagnosis not present

## 2021-12-22 DIAGNOSIS — I429 Cardiomyopathy, unspecified: Secondary | ICD-10-CM

## 2021-12-22 NOTE — Progress Notes (Signed)
Cardiology Office Note:    Date:  12/22/2021   ID:  Dustin Gray, DOB 04-18-1956, MRN [id number]  PCP:  [name], MD  Cardiologist:  [name], MD  Electrophysiologist:  [name], MD   Referring MD: [name], MD   Chief Complaint  Patient presents with   [facility] follow up for AFIB and follow up for Cardioversion. Patients states that he is doing ok, other than being tired. Meds reviewed with patient.     History of Present Illness:    Dustin Gray is a 66 y.o. male with a hx of persistent atrial fibrillation s/p DCCV, multiple sclerosis, lymphedema, hyperthyroidism/Graves' disease, who presents for follow-up.    Being seen for persistent A-fib.  Underwent DC cardioversion 11/22/2021 successfully.  Compliant with Eliquis, Cardizem, propanolol as prescribed.  A-fib typically adequately controlled when thyroid function is well managed.  Since TSH and free T4 obtained 11/21/2021 showed abnormal TSH and free T4, consistent with hypothyroidism.  His methimazole is suggested by his endocrinology team.  He otherwise feels okay, denies any chest pain, palpitations or shortness of breath.  Denies any bleeding issues with Eliquis.  Prior notes Patient originally seen in the hospital for atrial fibrillation with rapid ventricular response.  He was managed with diltiazem and Eliquis.  He was subsequently diagnosed with hyperthyroidism and propanolol ER added to his medical regimen.   Echocardiogram on 09/2019 showed normal ejection fraction with EF 55 to 60%, left atrial size was also normal.   He was started on anticoagulation due to plan for cardioversion.  Cardioversion was attempted but unsuccessful.  His heart rates have improved when hyperthyroidism is controlled.  Past Medical History:  Diagnosis Date   Abscess    groin   Arthritis    lower spine   Erectile dysfunction    Low testosterone    Lumbar herniated disc    L5   Multiple  sclerosis ([place])    Staph aureus infection     Past Surgical History:  Procedure Laterality Date   CARDIOVERSION N/A 11/22/2021   Procedure: CARDIOVERSION;  Surgeon: [name], MD;  Location: [facility];  Service: Cardiovascular;  Laterality: N/A;   COLONOSCOPY WITH PROPOFOL N/A 07/04/2016   Procedure: COLONOSCOPY WITH PROPOFOL;  Surgeon: [name], MD;  Location: [place];  Service: Endoscopy;  Laterality: N/A;   [place]   POLYPECTOMY  07/04/2016   Procedure: POLYPECTOMY;  Surgeon: [name], MD;  Location: [facility];  Service: Endoscopy;;   TONSILLECTOMY      Current Medications: Current Meds  Medication Sig   apixaban (ELIQUIS) 5 MG TABS tablet Take 1 tablet (5 mg total) by mouth 2 (two) times daily.   baclofen (LIORESAL) 10 MG tablet Take 10 mg by mouth 3 (three) times daily.   diltiazem (CARDIZEM SR) 120 MG 12 hr capsule Take 1 capsule (120 mg total) by mouth 2 (two) times daily.   Dimethyl Fumarate 240 MG CPDR Take 240 mg by mouth 2 (two) times daily.    furosemide (LASIX) 40 MG tablet Take 1 tablet (40 mg total) by mouth as needed. With your potassium.   gabapentin (NEURONTIN) 300 MG capsule Take 300-600 mg by mouth See admin instructions. Take 1 capsule (300 mg) by mouth in the morning, take 1 capsule (300 mg) by mouth at midday, & take 2 capsules (600 mg) by mouth at bedtime.   meloxicam (MOBIC) 15 MG  tablet Take 15 mg by mouth daily as needed for pain.    methimazole (TAPAZOLE) 5 MG tablet Take 0.5 tablets by mouth daily.   potassium chloride (KLOR-CON) 10 MEQ tablet Take 1 tablet (10 mEq total) by mouth as needed. Take with your Lasix.   propranolol ER (INDERAL LA) 80 MG 24 hr capsule Take 1 capsule (80 mg total) by mouth 2 (two) times daily.     Allergies:   Cephalexin, Ancef [cefazolin sodium], and Cefadroxil   Social History   Socioeconomic History   Marital status: Legally Separated    Spouse name: Not  on file   Number of children: Not on file   Years of education: Not on file   Highest education level: Not on file  Occupational History   Not on file  Tobacco Use   Smoking status: Former   Smokeless tobacco: Never   Tobacco comments:    smoked as teenager  Vaping Use   Vaping Use: Never used  Substance and Sexual Activity   Alcohol use: Not Currently    Comment: 2 drinks/month   Drug use: No   Sexual activity: Not on file  Other Topics Concern   Not on file  Social History Narrative   Not on file   Social Determinants of Health   Financial Resource Strain: Not on file  Food Insecurity: Not on file  Transportation Needs: Not on file  Physical Activity: Not on file  Stress: Not on file  Social Connections: Not on file     Family History: The patient's family history includes Diabetes in his father.  ROS:   Please see the history of present illness.     All other systems reviewed and are negative.  EKGs/Labs/Other Studies Reviewed:    The following studies were reviewed today:   EKG:  EKG is  ordered today.  The ekg ordered today demonstrates sinus bradycardia, heart rate 59  Recent Labs: 10/27/2021: BUN 13; Creatinine, Ser 0.92; Hemoglobin 15.9; Platelets 217; Potassium 4.7; Sodium 140  Recent Lipid Panel    Component Value Date/Time   CHOL 108 10/01/2019 0358   TRIG 81 10/01/2019 0358   HDL 35 (L) 10/01/2019 0358   CHOLHDL 3.1 10/01/2019 0358   VLDL 16 10/01/2019 0358   LDLCALC 57 10/01/2019 0358    Physical Exam:    VS:  BP 136/80 (BP Location: Left Arm, Patient Position: Sitting, Cuff Size: Normal)   Pulse (!) 59   Ht '6\' 2"'$  (1.88 m)   Wt 261 lb 9.6 oz (118.7 kg)   SpO2 95%   BMI 33.59 kg/m     Wt Readings from Last 3 Encounters:  12/22/21 261 lb 9.6 oz (118.7 kg)  11/22/21 262 lb (118.8 kg)  10/27/21 262 lb (118.8 kg)     GEN:  Well nourished, well developed in no acute distress HEENT: Normal NECK: No JVD; No carotid bruits CARDIAC:  Regular rate and rhythm RESPIRATORY:  Clear to auscultation without rales, wheezing or rhonchi  ABDOMEN: Soft, non-tender, non-distended MUSCULOSKELETAL:  no edema; No deformity  SKIN: Warm and dry NEUROLOGIC:  Alert and oriented x 3 PSYCHIATRIC:  Normal affect   ASSESSMENT:    1. Persistent atrial fibrillation (HCC)   2. Cardiomyopathy, unspecified type ([place])   3. Hyperthyroidism    PLAN:    In order of problems listed above:  Persistent atrial fibrillation.  S/p DC cardioversion 11/22/2021.  Maintaining sinus rhythm . Continue Cardizem SR 120 twice daily, continue Inderal, continue Eliquis  5 mg twice daily.  A-fib driven by thyroid dysfunction.  Management of thyroid dysfunction as per endocrinology. Cardiomyopathy EF 40 to 45%.  Likely tachycardia/A-fib induced.  Continue Cardizem and Inderal for now,.  Plan repeat echo after thyroid function is adequately controlled and patient maintaining sinus rhythm. hyperthyroidism.  On methimazole. management as per endocrinology.  Follow-up in 3 months.  Consider ordering repeat echo at follow-up visit.  Total encounter time 40 minutes  Greater than 50% was spent in counseling and coordination of care with the patient        Medication Adjustments/Labs and Tests Ordered: Current medicines are reviewed at length with the patient today.  Concerns regarding medicines are outlined above.  Orders Placed This Encounter  Procedures   EKG 12-Lead     No orders of the defined types were placed in this encounter.     Patient Instructions  Medication Instructions:   Your physician recommends that you continue on your current medications as directed. Please refer to the Current Medication list given to you today.  *If you need a refill on your cardiac medications before your next appointment, please call your pharmacy*   Follow-Up: At [facility], you and your health needs are our priority.  As part of our continuing mission to  provide you with exceptional heart care, we have created designated Provider Care Teams.  These Care Teams include your primary Cardiologist (physician) and Advanced Practice Providers (APPs -  Physician Assistants and Nurse Practitioners) who all work together to provide you with the care you need, when you need it.  We recommend signing up for the patient portal called "MyChart".  Sign up information is provided on this After Visit Summary.  MyChart is used to connect with patients for Virtual Visits (Telemedicine).  Patients are able to view lab/test results, encounter notes, upcoming appointments, etc.  Non-urgent messages can be sent to your provider as well.   To learn more about what you can do with MyChart, go to [email or web address].    Your next appointment:   3 month(s)  The format for your next appointment:   In Person  Provider:   [name], MD    Other Instructions   Important Information About Sugar         Signed, [name], MD  12/22/2021 11:48 AM    [place]

## 2021-12-22 NOTE — Patient Instructions (Signed)
Medication Instructions:   Your physician recommends that you continue on your current medications as directed. Please refer to the Current Medication list given to you today.  *If you need a refill on your cardiac medications before your next appointment, please call your pharmacy*   Follow-Up: At [facility], you and your health needs are our priority.  As part of our continuing mission to provide you with exceptional heart care, we have created designated Provider Care Teams.  These Care Teams include your primary Cardiologist (physician) and Advanced Practice Providers (APPs -  Physician Assistants and Nurse Practitioners) who all work together to provide you with the care you need, when you need it.  We recommend signing up for the patient portal called "MyChart".  Sign up information is provided on this After Visit Summary.  MyChart is used to connect with patients for Virtual Visits (Telemedicine).  Patients are able to view lab/test results, encounter notes, upcoming appointments, etc.  Non-urgent messages can be sent to your provider as well.   To learn more about what you can do with MyChart, go to [email or web address].    Your next appointment:   3 month(s)  The format for your next appointment:   In Person  Provider:   [name], MD    Other Instructions   Important Information About Sugar

## 2021-12-28 ENCOUNTER — Ambulatory Visit: Payer: Medicare PPO | Attending: Neurology

## 2021-12-28 DIAGNOSIS — R2689 Other abnormalities of gait and mobility: Secondary | ICD-10-CM | POA: Insufficient documentation

## 2021-12-28 DIAGNOSIS — R278 Other lack of coordination: Secondary | ICD-10-CM | POA: Diagnosis not present

## 2021-12-28 DIAGNOSIS — R269 Unspecified abnormalities of gait and mobility: Secondary | ICD-10-CM | POA: Insufficient documentation

## 2021-12-28 DIAGNOSIS — R262 Difficulty in walking, not elsewhere classified: Secondary | ICD-10-CM | POA: Diagnosis not present

## 2021-12-28 DIAGNOSIS — R2681 Unsteadiness on feet: Secondary | ICD-10-CM | POA: Insufficient documentation

## 2021-12-28 DIAGNOSIS — M6281 Muscle weakness (generalized): Secondary | ICD-10-CM | POA: Diagnosis not present

## 2021-12-28 NOTE — Therapy (Signed)
OUTPATIENT PHYSICAL THERAPY TREATMENT NOTE   Patient Name: Dustin Gray MRN: [id number] DOB:April 19, 1956, 66 y.o., male 72 Date: 12/28/2021  PCP: [name], MD REFERRING PROVIDER: [name], MD   PT End of Session - 12/28/21 1616     Visit Number 24    Number of Visits 38    Date for PT Re-Evaluation 01/04/22    Authorization Type [place] primary    Authorization Time Period 08/23/21-11/23/21 24 visits    Authorization - Number of Visits 24    Progress Note Due on Visit 66    PT Start Time 1519    PT Stop Time 1600    PT Time Calculation (min) 41 min    Equipment Utilized During Treatment Gait belt    Activity Tolerance Patient tolerated treatment well;No increased pain;Patient limited by fatigue    Behavior During Therapy [facility] for tasks assessed/performed              Past Medical History:  Diagnosis Date   Abscess    groin   Arthritis    lower spine   Erectile dysfunction    Low testosterone    Lumbar herniated disc    L5   Multiple sclerosis ([place])    Staph aureus infection    Past Surgical History:  Procedure Laterality Date   CARDIOVERSION N/A 11/22/2021   Procedure: CARDIOVERSION;  Surgeon: [name], MD;  Location: [facility];  Service: Cardiovascular;  Laterality: N/A;   COLONOSCOPY WITH PROPOFOL N/A 07/04/2016   Procedure: COLONOSCOPY WITH PROPOFOL;  Surgeon: [name], MD;  Location: [place];  Service: Endoscopy;  Laterality: N/A;   [place]   POLYPECTOMY  07/04/2016   Procedure: POLYPECTOMY;  Surgeon: [name], MD;  Location: [place];  Service: Endoscopy;;   TONSILLECTOMY     Patient Active Problem List   Diagnosis Date Noted   Persistent atrial fibrillation ([place])    Graves disease    Abdominal bloating    Atrial fibrillation with RVR ([place]) 10/01/2019   Hyperthyroidism    Atrial fibrillation with rapid ventricular response ([place]) 09/30/2019    Leg edema    Left leg cellulitis    Special screening for malignant neoplasms, colon    Polyp of sigmoid colon    Benign neoplasm of ascending colon    Rectal polyp    Herniated nucleus pulposus, L5-S1 11/09/2015   Low back pain 10/25/2015   Herniated nucleus pulposus, C5-6 right 10/06/2015   Dysesthesia 09/16/2015   Spasticity 09/16/2015   Unsteady gait 09/16/2015   Multiple sclerosis ([place]) 06/08/2011     REFERRING DIAG: R26.81 (ICD-10-CM) - Unsteadiness on feet   THERAPY DIAG:  Unsteadiness on feet  Difficulty in walking, not elsewhere classified  Abnormality of gait and mobility  Muscle weakness (generalized)  Rationale for Evaluation and Treatment Rehabilitation  PERTINENT HISTORY: Patient presents to physical therapy for unsteadiness, walking instability, fatigue. His PMH includes paroxysmal a fib, MS, lymphedema, hypothyroidism, Graves Disease, arthritis, lumbar herniated disc (L5), depression and neuropathy. Patient first developed symptoms of MS in 2005 and was diagnosed in 2006. Patient has done PT in the past, but hasn't been seen since 2020 at [facility]. Has not been doing his exercises in the past year. Walk outside to garage to ride lawnmower, walks in house. Retired Automotive engineer. Has a rollator at home but doesn't use it in the house   PRECAUTIONS: Fall  SUBJECTIVE: Nothing  new. No current aches or pains or concerns. Pt reports only dealing with some stress currently.   PAIN:  Are you having pain? No    TODAY'S TREATMENT: 12/28/2021  Therex    Nustep level 2 x 2 min, level 3 x 2 min, lvl 4 x 2 min. Min assist for set-up, pt monitored throughout for response to intervention.  Pt rates all levels as easy    Seated hip march with hurdle step over and back (3lb on right and no weight on left, hurdle on side to lower height) 2 x 15 reps ea LE. Pt rates as challenging, pt fatigues, requires recovery interval.  Sit to stand using 1 inch step with 3  degree wedge toward contralateral LE (under RLE)  1x10, 1x12 reps. Rates medium  Airex marching with UE support 1x20 alt LE, 1x30 alt LE - continued challenge to keep neutral ankle alignment on the left side, cuing throughout for correction    PATIENT EDUCATION: Education details: exercise technique, body mechanics Person educated: Patient Education method: Customer service manager, verbal cues Education comprehension: verbalized understanding, returned demonstration    HOME EXERCISE PROGRAM: No changes this session   PT Short Term Goals -       PT SHORT TERM GOAL #1   Title Patient will be independent in home exercise program to improve strength/mobility for better functional independence with ADLs.    Baseline 8/9: HEP to be given next session, 10/31: patient reports compliance with HEP and would like progressions added next session. 08/22/2021=Patient verbalized that he is walking and doing his LE exercises with no questions at this time.    Time 4    Period Weeks    Status Achieved    Target Date 08/16/21              PT Long Term Goals       PT LONG TERM GOAL #1   Title Patient will increase FOTO score to equal to or greater than 70%  to demonstrate statistically significant improvement in mobility and quality of life.    Baseline 8/9: 64%; risk adjusted 34%; 03/21/21 FOTO: 45; 04/24/21 FOTO: 49% ; 08/22/2021= 56% 4/20: 60% 11/28/21: 63%    Time 12    Period Weeks    Status On-going    Target Date 01/04/22      PT LONG TERM GOAL #2   Title Patient will increase Berg Balance score by > 6 points to demonstrate decreased fall risk during functional activities.    Baseline 4/20: 43/56 6/6: 48/56    Time 12    Period Weeks    Status On-going    Target Date 01/04/22      PT LONG TERM GOAL #3   Title Patient will increase 10 meter walk test to >1.22ms as to improve gait speed for better community ambulation and to reduce fall risk.    Baseline 8/9: 0.65 m/s with  rollator; 10/31: 0.68 m/s with rollator. 07/06/2021= 0.71 m/s using rollator; 08/22/2021= 0.94 m/s using rollator. 4/20: 1.1 m/s with rollator    Time 12    Period Weeks    Status Achieved    Target Date 10/11/21      PT LONG TERM GOAL #4   Title Patient will increase six minute walk test distance to >1000 for progression to community ambulator and improve gait ability    Baseline 8/9: 505 ft with rollator; 03/21/21: [id number] rollator, 04/24/21: [id number] rollator; 07/06/2021=900 feet; 08/22/2021= 935 feet with  use of Rollator 44/20: 940 ft 11/28/21: 1055 feet with rollator    Time 12    Period Weeks    Status Achieved    Target Date 01/04/22      PT LONG TERM GOAL #5   Title Patient will increase glute medius strength on Left LE from 3-/5 to 4/5 to improve stability, gait mechanics, and functional strength.    Baseline 10/31: 3-/5; 07/06/2021= 3-/5; 08/22/2021=3-/5 unable to achieve full ROM yet able to withstand some resistance with testing 4/20: unable to obtainfull ROM against gravity 6/6: unable to move against gravity    Time 12    Period Weeks    Status On-going    Target Date 01/04/22      PT LONG TERM GOAL #6   Title Patient will increase Left single leg stance time to 15 seconds or greater to increase safety in shower and independence with ADLs.    Baseline 10/31; 1.83 sec without UE support; 08/22/2021=2-3  sec  on left; 6 sec on right 4/20: RLE 10 seconds LLE unable to perform 6/6: R LE 4 seconds during BERG    Time 12    Period Weeks    Status On-going    Target Date 01/04/22      PT LONG TERM GOAL #7   Title Patient will safely negotiate 13 steps with handrails to safely enter/exit mothers house.    Baseline 4/20: very challenging 6/6: able to copmlete safely even managing dogs per pt report    Time 12    Period Weeks    Status New    Target Date 01/04/22              Plan - 12/13/21 1340     Clinical Impression Statement PT continued current plan of care as laid out in  recent prior sessions and eval. Pt with excellent motivation to participate in PT and tolerates interventions fair with no pain but with fatigue, requiring rest breaks throughout. Despite fatigue, pt was able to increase number of reps for majority of interventions and was able to increase level of resistance with nustep.  Pt would benefit from further skilled PT to improve strength, balance and mobility.   Personal Factors and Comorbidities Age;Comorbidity 3+;Fitness;Past/Current Experience;Time since onset of injury/illness/exacerbation    Comorbidities aroxysmal a fib, MS, lymphedema, hypothyroidism, Graves Disease, arthritis, lumbar herniated disc (L5), depression and neuropathy    Examination-Activity Limitations Bed Mobility;Bend;Caring for Others;Carry;Reach Overhead;Locomotion Level;Lift;Hygiene/Grooming;Dressing;Squat;Stairs;Stand;Toileting;Transfers    Examination-Participation Restrictions Cleaning;Community Activity;Laundry;Occupation;Shop;Volunteer;Yard Work    Merchant [employer] officer Evolving/Moderate complexity    Rehab Potential Fair    PT Frequency 2x / week    PT Duration 12 weeks    PT Treatment/Interventions ADLs/Self Care Home Management;Aquatic Therapy;Canalith Repostioning;Biofeedback;Cryotherapy;Ultrasound;Traction;Moist Heat;Electrical Stimulation;DME Instruction;Gait training;Therapeutic exercise;Therapeutic activities;Functional mobility training;Stair training;Balance training;Neuromuscular re-education;Patient/family education;Manual techniques;Orthotic Fit/Training;Compression bandaging;Passive range of motion;Vestibular;Taping;Splinting;Energy conservation;Dry needling;Visual/perceptual remediation/compensation    PT Next Visit Plan Continue with progessive balance training, Progressive LE strenthening as appropriate.    PT Home Exercise Plan Access Code: [email or web address] URL: [email or web address]  Added [place] with BTB and encouraged more walking around  the home during commerical breaks when watching TV.    Consulted and Agree with Plan of Care Patient               Ricard Dillon PT, DPT  12/13/2021, 1:41 PM

## 2022-01-01 ENCOUNTER — Ambulatory Visit: Payer: Medicare PPO | Admitting: Physical Therapy

## 2022-01-04 ENCOUNTER — Ambulatory Visit: Payer: Medicare PPO

## 2022-01-04 DIAGNOSIS — R2689 Other abnormalities of gait and mobility: Secondary | ICD-10-CM | POA: Diagnosis not present

## 2022-01-04 DIAGNOSIS — R262 Difficulty in walking, not elsewhere classified: Secondary | ICD-10-CM | POA: Diagnosis not present

## 2022-01-04 DIAGNOSIS — M6281 Muscle weakness (generalized): Secondary | ICD-10-CM

## 2022-01-04 DIAGNOSIS — R2681 Unsteadiness on feet: Secondary | ICD-10-CM | POA: Diagnosis not present

## 2022-01-04 DIAGNOSIS — R269 Unspecified abnormalities of gait and mobility: Secondary | ICD-10-CM | POA: Diagnosis not present

## 2022-01-04 DIAGNOSIS — R278 Other lack of coordination: Secondary | ICD-10-CM | POA: Diagnosis not present

## 2022-01-04 NOTE — Therapy (Signed)
OUTPATIENT PHYSICAL THERAPY TREATMENT NOTE/RECERTIFICATION FOR DATES 01/04/2022- 03/29/2022   Patient Name: Dustin Gray MRN: [id number] DOB:20-Jan-1956, 66 y.o., male 61 Date: 01/04/2022  PCP: [name], MD REFERRING PROVIDER: [name], MD   PT End of Session - 01/04/22 1117     Visit Number 14    Date for PT Re-Evaluation 01/04/22    Authorization Type [place] primary    Authorization Time Period 08/23/21-11/23/21 24 visits    Authorization - Number of Visits 24    Progress Note Due on Visit 89    PT Start Time 1113    PT Stop Time 1144    PT Time Calculation (min) 31 min    Equipment Utilized During Treatment Gait belt    Activity Tolerance Patient tolerated treatment well;No increased pain;Patient limited by fatigue    Behavior During Therapy [facility] for tasks assessed/performed              Past Medical History:  Diagnosis Date   Abscess    groin   Arthritis    lower spine   Erectile dysfunction    Low testosterone    Lumbar herniated disc    L5   Multiple sclerosis ([place])    Staph aureus infection    Past Surgical History:  Procedure Laterality Date   CARDIOVERSION N/A 11/22/2021   Procedure: CARDIOVERSION;  Surgeon: [name], MD;  Location: [facility];  Service: Cardiovascular;  Laterality: N/A;   COLONOSCOPY WITH PROPOFOL N/A 07/04/2016   Procedure: COLONOSCOPY WITH PROPOFOL;  Surgeon: [name], MD;  Location: [place];  Service: Endoscopy;  Laterality: N/A;   [place]   POLYPECTOMY  07/04/2016   Procedure: POLYPECTOMY;  Surgeon: [name], MD;  Location: [place];  Service: Endoscopy;;   TONSILLECTOMY     Patient Active Problem List   Diagnosis Date Noted   Persistent atrial fibrillation ([place])    Graves disease    Abdominal bloating    Atrial fibrillation with RVR ([place]) 10/01/2019   Hyperthyroidism    Atrial fibrillation with rapid ventricular  response ([place]) 09/30/2019   Leg edema    Left leg cellulitis    Special screening for malignant neoplasms, colon    Polyp of sigmoid colon    Benign neoplasm of ascending colon    Rectal polyp    Herniated nucleus pulposus, L5-S1 11/09/2015   Low back pain 10/25/2015   Herniated nucleus pulposus, C5-6 right 10/06/2015   Dysesthesia 09/16/2015   Spasticity 09/16/2015   Unsteady gait 09/16/2015   Multiple sclerosis ([place]) 06/08/2011     REFERRING DIAG: R26.81 (ICD-10-CM) - Unsteadiness on feet   THERAPY DIAG:  Unsteadiness on feet  Difficulty in walking, not elsewhere classified  Abnormality of gait and mobility  Muscle weakness (generalized)  Rationale for Evaluation and Treatment Rehabilitation  PERTINENT HISTORY: Patient presents to physical therapy for unsteadiness, walking instability, fatigue. His PMH includes paroxysmal a fib, MS, lymphedema, hypothyroidism, Graves Disease, arthritis, lumbar herniated disc (L5), depression and neuropathy. Patient first developed symptoms of MS in 2005 and was diagnosed in 2006. Patient has done PT in the past, but hasn't been seen since 2020 at [facility]. Has not been doing his exercises in the past year. Walk outside to garage to ride lawnmower, walks in house. 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 Pt reports  dealing with some stress at home currently but denies any changes physically since last visit. Reports he feels like he is making good progress and would like to continue with LE strengthening and balance.   PAIN:  Are you having pain? No    TODAY'S TREATMENT:   Reassessed all remaining goals for today's recert visit   -FOTO= 34% (slight decline but patient reports that he feels like he is moving around better and feels stronger) - BERG balance test= 50/56 (improved from 48/56) - SLS  - able to achieve approx 3 sec at best left leg and 8 sec on  right -Gluteal Med strength= 3-/5    PATIENT EDUCATION: Education details: exercise technique, body mechanics Person educated: Patient Education method: Customer service manager, verbal cues Education comprehension: verbalized understanding, returned demonstration    HOME EXERCISE PROGRAM: No changes this session   PT Short Term Goals -       PT SHORT TERM GOAL #1   Title Patient will be independent in home exercise program to improve strength/mobility for better functional independence with ADLs.    Baseline 8/9: HEP to be given next session, 10/31: patient reports compliance with HEP and would like progressions added next session. 08/22/2021=Patient verbalized that he is walking and doing his LE exercises with no questions at this time.    Time 4    Period Weeks    Status Achieved    Target Date 08/16/21              PT Long Term Goals       PT LONG TERM GOAL #1   Title Patient will increase FOTO score to equal to or greater than 70%  to demonstrate statistically significant improvement in mobility and quality of life.    Baseline 8/9: 64%; risk adjusted 34%; 03/21/21 FOTO: 45; 04/24/21 FOTO: 49% ; 08/22/2021= 56% 4/20: 60% 11/28/21: 63%; 01/04/2022= 60%   Time 12    Period Weeks    Status On-going    Target Date 03/29/2022     PT LONG TERM GOAL #2   Title Patient will increase Berg Balance score by > 6 points to demonstrate decreased fall risk during functional activities.    Baseline 4/20: 43/56 6/6: 48/56; 713/2023= 50/56   Time 12    Period Weeks    Status Achieved   Target Date 03/29/2022     PT LONG TERM GOAL #3   Title Patient will increase 10 meter walk test to >1.40ms as to improve gait speed for better community ambulation and to reduce fall risk.    Baseline 8/9: 0.65 m/s with rollator; 10/31: 0.68 m/s with rollator. 07/06/2021= 0.71 m/s using rollator; 08/22/2021= 0.94 m/s using rollator. 4/20: 1.1 m/s with rollator    Time 12    Period Weeks     Status Achieved    Target Date 10/11/21      PT LONG TERM GOAL #4   Title Patient will increase six minute walk test distance to >1000 for progression to community ambulator and improve gait ability    Baseline 8/9: 505 ft with rollator; 03/21/21: [id number] rollator, 04/24/21: 75566f rollator; 07/06/2021=900 feet; 08/22/2021= 935 feet with use of Rollator 44/20: 940 ft 11/28/21: 1055 feet with rollator    Time 12    Period Weeks    Status Achieved    Target Date 01/04/22      PT LONG TERM GOAL #5   Title Patient will increase glute medius strength on Left  LE from 3-/5 to 4/5 to improve stability, gait mechanics, and functional strength.    Baseline 10/31: 3-/5; 07/06/2021= 3-/5; 08/22/2021=3-/5 unable to achieve full ROM yet able to withstand some resistance with testing 4/20: unable to obtainfull ROM against gravity 6/6: unable to move against gravity; 01/04/2022= Continues to have difficulty - unable to raise left LE in sidelye against gravity but able to perform supine left Hip abd with some resistance= 3-/5   Time 12    Period Weeks    Status On-going    Target Date 03/29/2022     PT LONG TERM GOAL #6   Title Patient will increase Left single leg stance time to 15 seconds or greater to increase safety in shower and independence with ADLs.    Baseline 10/31; 1.83 sec without UE support; 08/22/2021=2-3  sec  on left; 6 sec on right 4/20: RLE 10 seconds LLE unable to perform 6/6: R LE 4 seconds during BERG; 01/04/2022= left = 3 sec and right = 8 sec   Time 12    Period Weeks    Status On-going    Target Date 03/29/2022     PT LONG TERM GOAL #7   Title Patient will safely negotiate 13 steps with handrails to safely enter/exit mothers house.    Baseline 4/20: very challenging 6/6: able to copmlete safely even managing dogs per pt report; 01/04/2022- Patient performed 16 steps with B Rail-no difficulty other than fatigue.    Time 12    Period Weeks    Status Achieved   Target Date 01/04/22               Plan - 12/13/21 1340     Clinical Impression Statement Patient presents with improving functional mobility this cert. He performed well today during reassessment- Progressing with balance and met his balance and step goal. He continues to present with ongoing Left LE muscle weakness and would benefit from further skilled PT to improve strength, balance and mobility. Remaining goals appropriate for recert at this time and patient  encouraged to continue. Patient's condition has the potential to improve in response to therapy. Maximum improvement is yet to be obtained. The anticipated improvement is attainable and reasonable in a generally predictable time.    Personal Factors and Comorbidities Age;Comorbidity 3+;Fitness;Past/Current Experience;Time since onset of injury/illness/exacerbation    Comorbidities aroxysmal a fib, MS, lymphedema, hypothyroidism, Graves Disease, arthritis, lumbar herniated disc (L5), depression and neuropathy    Examination-Activity Limitations Bed Mobility;Bend;Caring for Others;Carry;Reach Overhead;Locomotion Level;Lift;Hygiene/Grooming;Dressing;Squat;Stairs;Stand;Toileting;Transfers    Examination-Participation Restrictions Cleaning;Community Activity;Laundry;Occupation;Shop;Volunteer;Yard Work    Merchant navy officer Evolving/Moderate complexity    Rehab Potential Fair    PT Frequency 2x / week    PT Duration 12 weeks    PT Treatment/Interventions ADLs/Self Care Home Management;Aquatic Therapy;Canalith Repostioning;Biofeedback;Cryotherapy;Ultrasound;Traction;Moist Heat;Electrical Stimulation;DME Instruction;Gait training;Therapeutic exercise;Therapeutic activities;Functional mobility training;Stair training;Balance training;Neuromuscular re-education;Patient/family education;Manual techniques;Orthotic Fit/Training;Compression bandaging;Passive range of motion;Vestibular;Taping;Splinting;Energy conservation;Dry needling;Visual/perceptual  remediation/compensation    PT Next Visit Plan Continue with progessive balance training, Progressive LE strenthening as appropriate.    PT Home Exercise Plan Access Code: [email or web address] URL: [email or web address]  Added Bridges with BTB and encouraged more walking around the home during commerical breaks when watching TV.    Consulted and Agree with Plan of Care Patient               Ollen Bowl, PT   12/13/2021, 1:41 PM

## 2022-01-08 ENCOUNTER — Ambulatory Visit: Payer: Medicare PPO | Admitting: Physical Therapy

## 2022-01-08 ENCOUNTER — Telehealth: Payer: Self-pay | Admitting: Physical Therapy

## 2022-01-08 NOTE — Therapy (Deleted)
OUTPATIENT PHYSICAL THERAPY TREATMENT NOTE/RECERTIFICATION FOR DATES 01/04/2022- 03/29/2022   Patient Name: Dustin Gray MRN: [id number] DOB:[birth year], 66 y.o., male Today's Date: 01/08/2022  PCP: [name], MD REFERRING PROVIDER: [name], MD      Past Medical History:  Diagnosis Date   Abscess    groin   Arthritis    lower spine   Erectile dysfunction    Low testosterone    Lumbar herniated disc    L5   Multiple sclerosis ([place])    Staph aureus infection    Past Surgical History:  Procedure Laterality Date   CARDIOVERSION N/A 11/22/2021   Procedure: CARDIOVERSION;  Surgeon: [name], MD;  Location: [facility];  Service: Cardiovascular;  Laterality: N/A;   COLONOSCOPY WITH PROPOFOL N/A 07/04/2016   Procedure: COLONOSCOPY WITH PROPOFOL;  Surgeon: [name], MD;  Location: [place];  Service: Endoscopy;  Laterality: N/A;   [place]   POLYPECTOMY  07/04/2016   Procedure: POLYPECTOMY;  Surgeon: [name], MD;  Location: [place];  Service: Endoscopy;;   TONSILLECTOMY     Patient Active Problem List   Diagnosis Date Noted   Persistent atrial fibrillation ([place])    Graves disease    Abdominal bloating    Atrial fibrillation with RVR ([place]) 10/01/2019   Hyperthyroidism    Atrial fibrillation with rapid ventricular response ([place]) 09/30/2019   Leg edema    Left leg cellulitis    Special screening for malignant neoplasms, colon    Polyp of sigmoid colon    Benign neoplasm of ascending colon    Rectal polyp    Herniated nucleus pulposus, L5-S1 11/09/2015   Low back pain 10/25/2015   Herniated nucleus pulposus, C5-6 right 10/06/2015   Dysesthesia 09/16/2015   Spasticity 09/16/2015   Unsteady gait 09/16/2015   Multiple sclerosis ([place]) 06/08/2011     REFERRING DIAG: R26.81 (ICD-10-CM) - Unsteadiness on feet   THERAPY DIAG:  Unsteadiness on feet  Difficulty in walking, not  elsewhere classified  Abnormality of gait and mobility  Muscle weakness (generalized)  Rationale for Evaluation and Treatment Rehabilitation  PERTINENT HISTORY: Patient presents to physical therapy for unsteadiness, walking instability, fatigue. His PMH includes paroxysmal a fib, MS, lymphedema, hypothyroidism, Graves Disease, arthritis, lumbar herniated disc (L5), depression and neuropathy. Patient first developed symptoms of MS in 2005 and was diagnosed in 2006. Patient has done PT in the past, but hasn't been seen since 2020 at [facility]. Has not been doing his exercises in the past year. Walk outside to garage to ride lawnmower, walks in house. 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 Pt reports  dealing with some stress at home currently but denies any changes physically since last visit. Reports he feels like he is making good progress and would like to continue with LE strengthening and balance.   PAIN:  Are you having pain? No    TODAY'S TREATMENT:   Reassessed all remaining goals for today's recert visit   -FOTO= 98% (slight decline but patient reports that he feels like he is moving around better and feels stronger) - BERG balance test= 50/56 (improved from 48/56) - SLS  - able to achieve approx 3 sec at best left leg and 8 sec on right -Gluteal Med strength= 3-/5    PATIENT EDUCATION: Education details: exercise technique, body mechanics Person educated: Patient Education  method: Explanation and Demonstration, verbal cues Education comprehension: verbalized understanding, returned demonstration    HOME EXERCISE PROGRAM: No changes this session   PT Short Term Goals -       PT SHORT TERM GOAL #1   Title Patient will be independent in home exercise program to improve strength/mobility for better functional independence with ADLs.    Baseline 8/9: HEP to be given next session, 10/31:  patient reports compliance with HEP and would like progressions added next session. 08/22/2021=Patient verbalized that he is walking and doing his LE exercises with no questions at this time.    Time 4    Period Weeks    Status Achieved    Target Date 08/16/21              PT Long Term Goals       PT LONG TERM GOAL #1   Title Patient will increase FOTO score to equal to or greater than 70%  to demonstrate statistically significant improvement in mobility and quality of life.    Baseline 8/9: 64%; risk adjusted 34%; 03/21/21 FOTO: 45; 04/24/21 FOTO: 49% ; 08/22/2021= 56% 4/20: 60% 11/28/21: 63%; 01/04/2022= 60%   Time 12    Period Weeks    Status On-going    Target Date 03/29/2022     PT LONG TERM GOAL #2   Title Patient will increase Berg Balance score by > 6 points to demonstrate decreased fall risk during functional activities.    Baseline 4/20: 43/56 6/6: 48/56; 713/2023= 50/56   Time 12    Period Weeks    Status Achieved   Target Date 03/29/2022     PT LONG TERM GOAL #3   Title Patient will increase 10 meter walk test to >1.77ms as to improve gait speed for better community ambulation and to reduce fall risk.    Baseline 8/9: 0.65 m/s with rollator; 10/31: 0.68 m/s with rollator. 07/06/2021= 0.71 m/s using rollator; 08/22/2021= 0.94 m/s using rollator. 4/20: 1.1 m/s with rollator    Time 12    Period Weeks    Status Achieved    Target Date 10/11/21      PT LONG TERM GOAL #4   Title Patient will increase six minute walk test distance to >1000 for progression to community ambulator and improve gait ability    Baseline 8/9: 505 ft with rollator; 03/21/21: 7769fc rollator, 04/24/21: 75557f rollator; 07/06/2021=900 feet; 08/22/2021= 935 feet with use of Rollator 44/20: 940 ft 11/28/21: 1055 feet with rollator    Time 12    Period Weeks    Status Achieved    Target Date 01/04/22      PT LONG TERM GOAL #5   Title Patient will increase glute medius strength on Left LE from 3-/5 to  4/5 to improve stability, gait mechanics, and functional strength.    Baseline 10/31: 3-/5; 07/06/2021= 3-/5; 08/22/2021=3-/5 unable to achieve full ROM yet able to withstand some resistance with testing 4/20: unable to obtainfull ROM against gravity 6/6: unable to move against gravity; 01/04/2022= Continues to have difficulty - unable to raise left LE in sidelye against gravity but able to perform supine left Hip abd with some resistance= 3-/5   Time 12    Period Weeks    Status On-going    Target Date 03/29/2022     PT LONG TERM GOAL #6   Title Patient will increase Left single leg stance time to 15 seconds or greater to increase safety in shower  and independence with ADLs.    Baseline 10/31; 1.83 sec without UE support; 08/22/2021=2-3  sec  on left; 6 sec on right 4/20: RLE 10 seconds LLE unable to perform 6/6: R LE 4 seconds during BERG; 01/04/2022= left = 3 sec and right = 8 sec   Time 12    Period Weeks    Status On-going    Target Date 03/29/2022     PT LONG TERM GOAL #7   Title Patient will safely negotiate 13 steps with handrails to safely enter/exit mothers house.    Baseline 4/20: very challenging 6/6: able to copmlete safely even managing dogs per pt report; 01/04/2022- Patient performed 16 steps with B Rail-no difficulty other than fatigue.    Time 12    Period Weeks    Status Achieved   Target Date 01/04/22              Plan - 12/13/21 1340     Clinical Impression Statement Patient presents with improving functional mobility this cert. He performed well today during reassessment- Progressing with balance and met his balance and step goal. He continues to present with ongoing Left LE muscle weakness and would benefit from further skilled PT to improve strength, balance and mobility. Remaining goals appropriate for recert at this time and patient  encouraged to continue. Patient's condition has the potential to improve in response to therapy. Maximum improvement is yet to be  obtained. The anticipated improvement is attainable and reasonable in a generally predictable time.    Personal Factors and Comorbidities Age;Comorbidity 3+;Fitness;Past/Current Experience;Time since onset of injury/illness/exacerbation    Comorbidities aroxysmal a fib, MS, lymphedema, hypothyroidism, Graves Disease, arthritis, lumbar herniated disc (L5), depression and neuropathy    Examination-Activity Limitations Bed Mobility;Bend;Caring for Others;Carry;Reach Overhead;Locomotion Level;Lift;Hygiene/Grooming;Dressing;Squat;Stairs;Stand;Toileting;Transfers    Examination-Participation Restrictions Cleaning;Community Activity;Laundry;Occupation;Shop;Volunteer;Yard Work    Merchant navy officer Evolving/Moderate complexity    Rehab Potential Fair    PT Frequency 2x / week    PT Duration 12 weeks    PT Treatment/Interventions ADLs/Self Care Home Management;Aquatic Therapy;Canalith Repostioning;Biofeedback;Cryotherapy;Ultrasound;Traction;Moist Heat;Electrical Stimulation;DME Instruction;Gait training;Therapeutic exercise;Therapeutic activities;Functional mobility training;Stair training;Balance training;Neuromuscular re-education;Patient/family education;Manual techniques;Orthotic Fit/Training;Compression bandaging;Passive range of motion;Vestibular;Taping;Splinting;Energy conservation;Dry needling;Visual/perceptual remediation/compensation    PT Next Visit Plan Continue with progessive balance training, Progressive LE strenthening as appropriate.    PT Home Exercise Plan Access Code: [email or web address] URL: [email or web address]  Added Bridges with BTB and encouraged more walking around the home during commerical breaks when watching TV.    Consulted and Agree with Plan of Care Patient               Ollen Bowl, PT   12/13/2021, 1:41 PM

## 2022-01-08 NOTE — Telephone Encounter (Signed)
Pt contacted via telephone and author and unable to leave  VM regarding missed appointment 7/17 ( voicemail box was full)   Rivka Barbara PT, DPT

## 2022-01-11 ENCOUNTER — Ambulatory Visit: Payer: Medicare PPO

## 2022-01-16 ENCOUNTER — Ambulatory Visit: Payer: Medicare PPO

## 2022-01-16 DIAGNOSIS — E0500 Thyrotoxicosis with diffuse goiter without thyrotoxic crisis or storm: Secondary | ICD-10-CM | POA: Diagnosis not present

## 2022-01-18 ENCOUNTER — Ambulatory Visit: Payer: Medicare PPO

## 2022-01-23 ENCOUNTER — Ambulatory Visit: Payer: Medicare PPO

## 2022-01-25 ENCOUNTER — Ambulatory Visit: Payer: Medicare PPO | Attending: Neurology

## 2022-01-25 DIAGNOSIS — R278 Other lack of coordination: Secondary | ICD-10-CM | POA: Insufficient documentation

## 2022-01-25 DIAGNOSIS — R2689 Other abnormalities of gait and mobility: Secondary | ICD-10-CM | POA: Insufficient documentation

## 2022-01-25 DIAGNOSIS — R262 Difficulty in walking, not elsewhere classified: Secondary | ICD-10-CM | POA: Insufficient documentation

## 2022-01-25 DIAGNOSIS — R269 Unspecified abnormalities of gait and mobility: Secondary | ICD-10-CM | POA: Insufficient documentation

## 2022-01-25 DIAGNOSIS — M6281 Muscle weakness (generalized): Secondary | ICD-10-CM | POA: Insufficient documentation

## 2022-01-25 DIAGNOSIS — R2681 Unsteadiness on feet: Secondary | ICD-10-CM | POA: Insufficient documentation

## 2022-01-30 ENCOUNTER — Ambulatory Visit: Payer: Medicare PPO

## 2022-01-30 DIAGNOSIS — R278 Other lack of coordination: Secondary | ICD-10-CM

## 2022-01-30 DIAGNOSIS — R2681 Unsteadiness on feet: Secondary | ICD-10-CM

## 2022-01-30 DIAGNOSIS — M6281 Muscle weakness (generalized): Secondary | ICD-10-CM

## 2022-01-30 DIAGNOSIS — R2689 Other abnormalities of gait and mobility: Secondary | ICD-10-CM | POA: Diagnosis not present

## 2022-01-30 DIAGNOSIS — R262 Difficulty in walking, not elsewhere classified: Secondary | ICD-10-CM | POA: Diagnosis not present

## 2022-01-30 DIAGNOSIS — R269 Unspecified abnormalities of gait and mobility: Secondary | ICD-10-CM | POA: Diagnosis not present

## 2022-01-30 NOTE — Therapy (Signed)
OUTPATIENT PHYSICAL THERAPY TREATMENT NOTE/RECERTIFICATION FOR DATES 01/04/2022- 03/29/2022   Patient Name: Dustin Gray MRN: [id number] DOB:Mar 01, 1956, 66 y.o., male 54 Date: 01/30/2022  PCP: [name], MD REFERRING PROVIDER: [name], MD   PT End of Session - 01/30/22 1407     Visit Number 75    Date for PT Re-Evaluation 01/04/22    Authorization Type [place] primary    Authorization Time Period 08/23/21-11/23/21 24 visits    Authorization - Number of Visits 24    Progress Note Due on Visit 70    PT Start Time 1400    Equipment Utilized During Treatment Gait belt    Activity Tolerance Patient tolerated treatment well;No increased pain;Patient limited by fatigue    Behavior During Therapy [facility] for tasks assessed/performed              Past Medical History:  Diagnosis Date   Abscess    groin   Arthritis    lower spine   Erectile dysfunction    Low testosterone    Lumbar herniated disc    L5   Multiple sclerosis ([place])    Staph aureus infection    Past Surgical History:  Procedure Laterality Date   CARDIOVERSION N/A 11/22/2021   Procedure: CARDIOVERSION;  Surgeon: [name], MD;  Location: [facility];  Service: Cardiovascular;  Laterality: N/A;   COLONOSCOPY WITH PROPOFOL N/A 07/04/2016   Procedure: COLONOSCOPY WITH PROPOFOL;  Surgeon: [name], MD;  Location: [place];  Service: Endoscopy;  Laterality: N/A;   [place]   POLYPECTOMY  07/04/2016   Procedure: POLYPECTOMY;  Surgeon: [name], MD;  Location: [place];  Service: Endoscopy;;   TONSILLECTOMY     Patient Active Problem List   Diagnosis Date Noted   Persistent atrial fibrillation ([place])    Graves disease    Abdominal bloating    Atrial fibrillation with RVR ([place]) 10/01/2019   Hyperthyroidism    Atrial fibrillation with rapid ventricular response ([place]) 09/30/2019   Leg edema    Left leg  cellulitis    Special screening for malignant neoplasms, colon    Polyp of sigmoid colon    Benign neoplasm of ascending colon    Rectal polyp    Herniated nucleus pulposus, L5-S1 11/09/2015   Low back pain 10/25/2015   Herniated nucleus pulposus, C5-6 right 10/06/2015   Dysesthesia 09/16/2015   Spasticity 09/16/2015   Unsteady gait 09/16/2015   Multiple sclerosis ([place]) 06/08/2011     REFERRING DIAG: R26.81 (ICD-10-CM) - Unsteadiness on feet   THERAPY DIAG:  Unsteadiness on feet  Difficulty in walking, not elsewhere classified  Abnormality of gait and mobility  Muscle weakness (generalized)  Rationale for Evaluation and Treatment Rehabilitation  PERTINENT HISTORY: Patient presents to physical therapy for unsteadiness, walking instability, fatigue. His PMH includes paroxysmal a fib, MS, lymphedema, hypothyroidism, Graves Disease, arthritis, lumbar herniated disc (L5), depression and neuropathy. Patient first developed symptoms of MS in 2005 and was diagnosed in 2006. Patient has done PT in the past, but hasn't been seen since 2020 at [facility]. Has not been doing his exercises in the past year. Walk outside to garage to ride lawnmower, walks in house. 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 Pt reports  dealing with some stress at home currently but denies any changes physically since last visit. Reports  he feels like he is making good progress and would like to continue with LE strengthening and balance.   PAIN:  Are you having pain? No    TODAY'S TREATMENT:   Reassessed all remaining goals for today's recert visit   -FOTO= 83% (slight decline but patient reports that he feels like he is moving around better and feels stronger) - BERG balance test= 50/56 (improved from 48/56) - SLS  - able to achieve approx 3 sec at best left leg and 8 sec on right -Gluteal Med strength= 3-/5    PATIENT  EDUCATION: Education details: exercise technique, body mechanics Person educated: Patient Education method: Customer service manager, verbal cues Education comprehension: verbalized understanding, returned demonstration    HOME EXERCISE PROGRAM: No changes this session   PT Short Term Goals -       PT SHORT TERM GOAL #1   Title Patient will be independent in home exercise program to improve strength/mobility for better functional independence with ADLs.    Baseline 8/9: HEP to be given next session, 10/31: patient reports compliance with HEP and would like progressions added next session. 08/22/2021=Patient verbalized that he is walking and doing his LE exercises with no questions at this time.    Time 4    Period Weeks    Status Achieved    Target Date 08/16/21              PT Long Term Goals       PT LONG TERM GOAL #1   Title Patient will increase FOTO score to equal to or greater than 70%  to demonstrate statistically significant improvement in mobility and quality of life.    Baseline 8/9: 64%; risk adjusted 34%; 03/21/21 FOTO: 45; 04/24/21 FOTO: 49% ; 08/22/2021= 56% 4/20: 60% 11/28/21: 63%; 01/04/2022= 60%   Time 12    Period Weeks    Status On-going    Target Date 03/29/2022     PT LONG TERM GOAL #2   Title Patient will increase Berg Balance score by > 6 points to demonstrate decreased fall risk during functional activities.    Baseline 4/20: 43/56 6/6: 48/56; 713/2023= 50/56   Time 12    Period Weeks    Status Achieved   Target Date 03/29/2022     PT LONG TERM GOAL #3   Title Patient will increase 10 meter walk test to >1.30ms as to improve gait speed for better community ambulation and to reduce fall risk.    Baseline 8/9: 0.65 m/s with rollator; 10/31: 0.68 m/s with rollator. 07/06/2021= 0.71 m/s using rollator; 08/22/2021= 0.94 m/s using rollator. 4/20: 1.1 m/s with rollator    Time 12    Period Weeks    Status Achieved    Target Date 10/11/21      PT  LONG TERM GOAL #4   Title Patient will increase six minute walk test distance to >1000 for progression to community ambulator and improve gait ability    Baseline 8/9: 505 ft with rollator; 03/21/21: 7716fc rollator, 04/24/21: 75564f rollator; 07/06/2021=900 feet; 08/22/2021= 935 feet with use of Rollator 44/20: 940 ft 11/28/21: 1055 feet with rollator    Time 12    Period Weeks    Status Achieved    Target Date 01/04/22      PT LONG TERM GOAL #5   Title Patient will increase glute medius strength on Left LE from 3-/5 to 4/5 to improve stability, gait mechanics, and functional strength.  Baseline 10/31: 3-/5; 07/06/2021= 3-/5; 08/22/2021=3-/5 unable to achieve full ROM yet able to withstand some resistance with testing 4/20: unable to obtainfull ROM against gravity 6/6: unable to move against gravity; 01/04/2022= Continues to have difficulty - unable to raise left LE in sidelye against gravity but able to perform supine left Hip abd with some resistance= 3-/5   Time 12    Period Weeks    Status On-going    Target Date 03/29/2022     PT LONG TERM GOAL #6   Title Patient will increase Left single leg stance time to 15 seconds or greater to increase safety in shower and independence with ADLs.    Baseline 10/31; 1.83 sec without UE support; 08/22/2021=2-3  sec  on left; 6 sec on right 4/20: RLE 10 seconds LLE unable to perform 6/6: R LE 4 seconds during BERG; 01/04/2022= left = 3 sec and right = 8 sec   Time 12    Period Weeks    Status On-going    Target Date 03/29/2022     PT LONG TERM GOAL #7   Title Patient will safely negotiate 13 steps with handrails to safely enter/exit mothers house.    Baseline 4/20: very challenging 6/6: able to copmlete safely even managing dogs per pt report; 01/04/2022- Patient performed 16 steps with B Rail-no difficulty other than fatigue.    Time 12    Period Weeks    Status Achieved   Target Date 01/04/22              Plan - 12/13/21 1340     Clinical  Impression Statement Patient presents with improving functional mobility this cert. He performed well today during reassessment- Progressing with balance and met his balance and step goal. He continues to present with ongoing Left LE muscle weakness and would benefit from further skilled PT to improve strength, balance and mobility. Remaining goals appropriate for recert at this time and patient  encouraged to continue. Patient's condition has the potential to improve in response to therapy. Maximum improvement is yet to be obtained. The anticipated improvement is attainable and reasonable in a generally predictable time.    Personal Factors and Comorbidities Age;Comorbidity 3+;Fitness;Past/Current Experience;Time since onset of injury/illness/exacerbation    Comorbidities aroxysmal a fib, MS, lymphedema, hypothyroidism, Graves Disease, arthritis, lumbar herniated disc (L5), depression and neuropathy    Examination-Activity Limitations Bed Mobility;Bend;Caring for Others;Carry;Reach Overhead;Locomotion Level;Lift;Hygiene/Grooming;Dressing;Squat;Stairs;Stand;Toileting;Transfers    Examination-Participation Restrictions Cleaning;Community Activity;Laundry;Occupation;Shop;Volunteer;Yard Work    Merchant navy officer Evolving/Moderate complexity    Rehab Potential Fair    PT Frequency 2x / week    PT Duration 12 weeks    PT Treatment/Interventions ADLs/Self Care Home Management;Aquatic Therapy;Canalith Repostioning;Biofeedback;Cryotherapy;Ultrasound;Traction;Moist Heat;Electrical Stimulation;DME Instruction;Gait training;Therapeutic exercise;Therapeutic activities;Functional mobility training;Stair training;Balance training;Neuromuscular re-education;Patient/family education;Manual techniques;Orthotic Fit/Training;Compression bandaging;Passive range of motion;Vestibular;Taping;Splinting;Energy conservation;Dry needling;Visual/perceptual remediation/compensation    PT Next Visit Plan Continue with  progessive balance training, Progressive LE strenthening as appropriate.    PT Home Exercise Plan Access Code: [email or web address] URL: [email or web address]  Added Bridges with BTB and encouraged more walking around the home during commerical breaks when watching TV.    Consulted and Agree with Plan of Care Patient               Ollen Bowl, PT   12/13/2021, 1:41 PM

## 2022-02-01 ENCOUNTER — Ambulatory Visit: Payer: Medicare PPO

## 2022-02-01 ENCOUNTER — Encounter: Payer: Medicare PPO | Admitting: Occupational Therapy

## 2022-02-05 DIAGNOSIS — I4891 Unspecified atrial fibrillation: Secondary | ICD-10-CM | POA: Diagnosis not present

## 2022-02-05 DIAGNOSIS — E0500 Thyrotoxicosis with diffuse goiter without thyrotoxic crisis or storm: Secondary | ICD-10-CM | POA: Diagnosis not present

## 2022-02-05 DIAGNOSIS — Z0000 Encounter for general adult medical examination without abnormal findings: Secondary | ICD-10-CM | POA: Diagnosis not present

## 2022-02-05 DIAGNOSIS — G35 Multiple sclerosis: Secondary | ICD-10-CM | POA: Diagnosis not present

## 2022-02-05 DIAGNOSIS — Z23 Encounter for immunization: Secondary | ICD-10-CM | POA: Diagnosis not present

## 2022-02-05 DIAGNOSIS — E781 Pure hyperglyceridemia: Secondary | ICD-10-CM | POA: Diagnosis not present

## 2022-02-05 DIAGNOSIS — Z1331 Encounter for screening for depression: Secondary | ICD-10-CM | POA: Diagnosis not present

## 2022-02-05 DIAGNOSIS — D6869 Other thrombophilia: Secondary | ICD-10-CM | POA: Diagnosis not present

## 2022-02-05 DIAGNOSIS — Z125 Encounter for screening for malignant neoplasm of prostate: Secondary | ICD-10-CM | POA: Diagnosis not present

## 2022-02-06 ENCOUNTER — Ambulatory Visit: Payer: Medicare PPO

## 2022-02-06 DIAGNOSIS — R2689 Other abnormalities of gait and mobility: Secondary | ICD-10-CM | POA: Diagnosis not present

## 2022-02-06 DIAGNOSIS — R2681 Unsteadiness on feet: Secondary | ICD-10-CM

## 2022-02-06 DIAGNOSIS — R262 Difficulty in walking, not elsewhere classified: Secondary | ICD-10-CM

## 2022-02-06 DIAGNOSIS — R278 Other lack of coordination: Secondary | ICD-10-CM

## 2022-02-06 DIAGNOSIS — R269 Unspecified abnormalities of gait and mobility: Secondary | ICD-10-CM

## 2022-02-06 DIAGNOSIS — M6281 Muscle weakness (generalized): Secondary | ICD-10-CM

## 2022-02-06 NOTE — Therapy (Signed)
OUTPATIENT PHYSICAL THERAPY TREATMENT NOTE  Patient Name: Dustin Gray MRN: [id number] DOB:Jul 30, 1955, 66 y.o., male 41 Date: 02/06/2022  PCP: [name], MD REFERRING PROVIDER: [name], MD   PT End of Session - 02/06/22 1407     Visit Number 88    Number of Visits 52    Date for PT Re-Evaluation 04/24/22    Authorization Type [place] primary    Authorization Time Period 08/23/21-11/23/21 24 visits    Authorization - Number of Visits 24    Progress Note Due on Visit 1    PT Start Time 1348    PT Stop Time 1429    PT Time Calculation (min) 41 min    Equipment Utilized During Treatment Gait belt    Activity Tolerance Patient tolerated treatment well;No increased pain;Patient limited by fatigue    Behavior During Therapy [facility] for tasks assessed/performed              Past Medical History:  Diagnosis Date   Abscess    groin   Arthritis    lower spine   Erectile dysfunction    Low testosterone    Lumbar herniated disc    L5   Multiple sclerosis ([place])    Staph aureus infection    Past Surgical History:  Procedure Laterality Date   CARDIOVERSION N/A 11/22/2021   Procedure: CARDIOVERSION;  Surgeon: [name], MD;  Location: [facility];  Service: Cardiovascular;  Laterality: N/A;   COLONOSCOPY WITH PROPOFOL N/A 07/04/2016   Procedure: COLONOSCOPY WITH PROPOFOL;  Surgeon: [name], MD;  Location: [place];  Service: Endoscopy;  Laterality: N/A;   [place]   POLYPECTOMY  07/04/2016   Procedure: POLYPECTOMY;  Surgeon: [name], MD;  Location: [place];  Service: Endoscopy;;   TONSILLECTOMY     Patient Active Problem List   Diagnosis Date Noted   Persistent atrial fibrillation ([place])    Graves disease    Abdominal bloating    Atrial fibrillation with RVR ([place]) 10/01/2019   Hyperthyroidism    Atrial fibrillation with rapid ventricular response ([place]) 09/30/2019    Leg edema    Left leg cellulitis    Special screening for malignant neoplasms, colon    Polyp of sigmoid colon    Benign neoplasm of ascending colon    Rectal polyp    Herniated nucleus pulposus, L5-S1 11/09/2015   Low back pain 10/25/2015   Herniated nucleus pulposus, C5-6 right 10/06/2015   Dysesthesia 09/16/2015   Spasticity 09/16/2015   Unsteady gait 09/16/2015   Multiple sclerosis ([place]) 06/08/2011     REFERRING DIAG: R26.81 (ICD-10-CM) - Unsteadiness on feet   THERAPY DIAG:  Unsteadiness on feet  Difficulty in walking, not elsewhere classified  Abnormality of gait and mobility  Muscle weakness (generalized)  Rationale for Evaluation and Treatment Rehabilitation  PERTINENT HISTORY: Patient presents to physical therapy for unsteadiness, walking instability, fatigue. His PMH includes paroxysmal a fib, MS, lymphedema, hypothyroidism, Graves Disease, arthritis, lumbar herniated disc (L5), depression and neuropathy. Patient first developed symptoms of MS in 2005 and was diagnosed in 2006. Patient has done PT in the past, but hasn't been seen since 2020 at [facility]. Has not been doing his exercises in the past year. Walk outside to garage to ride lawnmower, walks in house. Retired Automotive engineer. Has a rollator at home but doesn't use it in the house   PRECAUTIONS: Fall  SUBJECTIVE: Patient reports  doing okay - denies any significant issues and no falls.   PAIN:  Are you having pain? No    TODAY'S TREATMENT:    Therex:    Standing hip hike + swings forward/backward x 20 reps-  each with BUE support Sit to stand with min BUE Support x 12 reps.  Standing calf raises x 12 reps.  Standing Ham curls x BTB 2 sets x 12 reps Forward mini lunge squat x 8 reps each LE Reverse Mini lunge squat x 8 reps each LE  Side step onto then off of green therapad x 10 reps each LE (difficulty with left knee hyperextension)   Education provided throughout session via VC/TC and  demonstration to facilitate movement at target joints and correct muscle activation for all testing and exercises performed.   PATIENT EDUCATION: Education details: exercise technique, body mechanics Person educated: Patient Education method: Customer service manager, verbal cues Education comprehension: verbalized understanding, returned demonstration    HOME EXERCISE PROGRAM: No changes this session   PT Short Term Goals -       PT SHORT TERM GOAL #1   Title Patient will be independent in home exercise program to improve strength/mobility for better functional independence with ADLs.    Baseline 8/9: HEP to be given next session, 10/31: patient reports compliance with HEP and would like progressions added next session. 08/22/2021=Patient verbalized that he is walking and doing his LE exercises with no questions at this time.    Time 4    Period Weeks    Status Achieved    Target Date 08/16/21              PT Long Term Goals       PT LONG TERM GOAL #1   Title Patient will increase FOTO score to equal to or greater than 70%  to demonstrate statistically significant improvement in mobility and quality of life.    Baseline 8/9: 64%; risk adjusted 34%; 03/21/21 FOTO: 45; 04/24/21 FOTO: 49% ; 08/22/2021= 56% 4/20: 60% 11/28/21: 63%; 01/04/2022= 60%   Time 12    Period Weeks    Status On-going    Target Date 03/29/2022     PT LONG TERM GOAL #2   Title Patient will increase Berg Balance score by > 6 points to demonstrate decreased fall risk during functional activities.    Baseline 4/20: 43/56 6/6: 48/56; 713/2023= 50/56   Time 12    Period Weeks    Status Achieved   Target Date 03/29/2022     PT LONG TERM GOAL #3   Title Patient will increase 10 meter walk test to >1.33ms as to improve gait speed for better community ambulation and to reduce fall risk.    Baseline 8/9: 0.65 m/s with rollator; 10/31: 0.68 m/s with rollator. 07/06/2021= 0.71 m/s using rollator; 08/22/2021=  0.94 m/s using rollator. 4/20: 1.1 m/s with rollator    Time 12    Period Weeks    Status Achieved    Target Date 10/11/21      PT LONG TERM GOAL #4   Title Patient will increase six minute walk test distance to >1000 for progression to community ambulator and improve gait ability    Baseline 8/9: 505 ft with rollator; 03/21/21: 7712fc rollator, 04/24/21: 75592f rollator; 07/06/2021=900 feet; 08/22/2021= 935 feet with use of Rollator 44/20: 940 ft 11/28/21: 1055 feet with rollator    Time 12    Period Weeks    Status Achieved  Target Date 01/04/22      PT LONG TERM GOAL #5   Title Patient will increase glute medius strength on Left LE from 3-/5 to 4/5 to improve stability, gait mechanics, and functional strength.    Baseline 10/31: 3-/5; 07/06/2021= 3-/5; 08/22/2021=3-/5 unable to achieve full ROM yet able to withstand some resistance with testing 4/20: unable to obtainfull ROM against gravity 6/6: unable to move against gravity; 01/04/2022= Continues to have difficulty - unable to raise left LE in sidelye against gravity but able to perform supine left Hip abd with some resistance= 3-/5   Time 12    Period Weeks    Status On-going    Target Date 03/29/2022     PT LONG TERM GOAL #6   Title Patient will increase Left single leg stance time to 15 seconds or greater to increase safety in shower and independence with ADLs.    Baseline 10/31; 1.83 sec without UE support; 08/22/2021=2-3  sec  on left; 6 sec on right 4/20: RLE 10 seconds LLE unable to perform 6/6: R LE 4 seconds during BERG; 01/04/2022= left = 3 sec and right = 8 sec   Time 12    Period Weeks    Status On-going    Target Date 03/29/2022     PT LONG TERM GOAL #7   Title Patient will safely negotiate 13 steps with handrails to safely enter/exit mothers house.    Baseline 4/20: very challenging 6/6: able to copmlete safely even managing dogs per pt report; 01/04/2022- Patient performed 16 steps with B Rail-no difficulty other than  fatigue.    Time 12    Period Weeks    Status Achieved   Target Date 01/04/22              Plan - 12/13/21 1340     Clinical Impression Statement Patient presented with ongoing left LE muscle weakness- Well motivated for today's session including mostly strengthening exercises. He was limited by some hyperextension of left knee and fatigue but able to complete all therex today with VC. Patient will benefit from skilled PT services to improve his LE strength, balance and overall mobility for decreased risk of falling and improved quality of life.    Personal Factors and Comorbidities Age;Comorbidity 3+;Fitness;Past/Current Experience;Time since onset of injury/illness/exacerbation    Comorbidities aroxysmal a fib, MS, lymphedema, hypothyroidism, Graves Disease, arthritis, lumbar herniated disc (L5), depression and neuropathy    Examination-Activity Limitations Bed Mobility;Bend;Caring for Others;Carry;Reach Overhead;Locomotion Level;Lift;Hygiene/Grooming;Dressing;Squat;Stairs;Stand;Toileting;Transfers    Examination-Participation Restrictions Cleaning;Community Activity;Laundry;Occupation;Shop;Volunteer;Yard Work    Merchant [employer] officer Evolving/Moderate complexity    Rehab Potential Fair    PT Frequency 2x / week    PT Duration 12 weeks    PT Treatment/Interventions ADLs/Self Care Home Management;Aquatic Therapy;Canalith Repostioning;Biofeedback;Cryotherapy;Ultrasound;Traction;Moist Heat;Electrical Stimulation;DME Instruction;Gait training;Therapeutic exercise;Therapeutic activities;Functional mobility training;Stair training;Balance training;Neuromuscular re-education;Patient/family education;Manual techniques;Orthotic Fit/Training;Compression bandaging;Passive range of motion;Vestibular;Taping;Splinting;Energy conservation;Dry needling;Visual/perceptual remediation/compensation    PT Next Visit Plan Continue with progessive balance training, Progressive LE strenthening as  appropriate.    PT Home Exercise Plan Access Code: [email or web address] URL: [email or web address]  Added Bridges with BTB and encouraged more walking around the home during commerical breaks when watching TV.    Consulted and Agree with Plan of Care Patient               Ollen Bowl, PT   12/13/2021, 1:41 PM

## 2022-02-08 ENCOUNTER — Ambulatory Visit: Payer: Medicare PPO

## 2022-02-08 DIAGNOSIS — M6281 Muscle weakness (generalized): Secondary | ICD-10-CM

## 2022-02-08 DIAGNOSIS — R2681 Unsteadiness on feet: Secondary | ICD-10-CM

## 2022-02-08 DIAGNOSIS — R269 Unspecified abnormalities of gait and mobility: Secondary | ICD-10-CM

## 2022-02-08 DIAGNOSIS — R262 Difficulty in walking, not elsewhere classified: Secondary | ICD-10-CM

## 2022-02-08 DIAGNOSIS — R278 Other lack of coordination: Secondary | ICD-10-CM | POA: Diagnosis not present

## 2022-02-08 DIAGNOSIS — R2689 Other abnormalities of gait and mobility: Secondary | ICD-10-CM | POA: Diagnosis not present

## 2022-02-08 NOTE — Therapy (Signed)
OUTPATIENT PHYSICAL THERAPY TREATMENT NOTE  Patient Name: Dustin Gray MRN: [id number] DOB:04-17-1956, 66 y.o., male 6 Date: 02/08/2022  PCP: [name], MD REFERRING PROVIDER: [name], MD   PT End of Session - 02/08/22 1352     Visit Number 48    Number of Visits 78    Date for PT Re-Evaluation 04/24/22    Authorization Type [place] primary    Authorization Time Period 08/23/21-11/23/21 24 visits    Authorization - Number of Visits 24    Progress Note Due on Visit 86    PT Start Time 1352    PT Stop Time 1430    PT Time Calculation (min) 38 min    Equipment Utilized During Treatment Gait belt    Activity Tolerance Patient tolerated treatment well;No increased pain;Patient limited by fatigue    Behavior During Therapy [facility] for tasks assessed/performed               Past Medical History:  Diagnosis Date   Abscess    groin   Arthritis    lower spine   Erectile dysfunction    Low testosterone    Lumbar herniated disc    L5   Multiple sclerosis ([place])    Staph aureus infection    Past Surgical History:  Procedure Laterality Date   CARDIOVERSION N/A 11/22/2021   Procedure: CARDIOVERSION;  Surgeon: [name], MD;  Location: [facility];  Service: Cardiovascular;  Laterality: N/A;   COLONOSCOPY WITH PROPOFOL N/A 07/04/2016   Procedure: COLONOSCOPY WITH PROPOFOL;  Surgeon: [name], MD;  Location: [place];  Service: Endoscopy;  Laterality: N/A;   [employer]   POLYPECTOMY  07/04/2016   Procedure: POLYPECTOMY;  Surgeon: [name], MD;  Location: [place];  Service: Endoscopy;;   TONSILLECTOMY     Patient Active Problem List   Diagnosis Date Noted   Persistent atrial fibrillation ([place])    Graves disease    Abdominal bloating    Atrial fibrillation with RVR ([place]) 10/01/2019   Hyperthyroidism    Atrial fibrillation with rapid ventricular response ([place])  09/30/2019   Leg edema    Left leg cellulitis    Special screening for malignant neoplasms, colon    Polyp of sigmoid colon    Benign neoplasm of ascending colon    Rectal polyp    Herniated nucleus pulposus, L5-S1 11/09/2015   Low back pain 10/25/2015   Herniated nucleus pulposus, C5-6 right 10/06/2015   Dysesthesia 09/16/2015   Spasticity 09/16/2015   Unsteady gait 09/16/2015   Multiple sclerosis ([place]) 06/08/2011     REFERRING DIAG: R26.81 (ICD-10-CM) - Unsteadiness on feet   THERAPY DIAG:  Unsteadiness on feet  Difficulty in walking, not elsewhere classified  Abnormality of gait and mobility  Muscle weakness (generalized)  Rationale for Evaluation and Treatment Rehabilitation  PERTINENT HISTORY: Patient presents to physical therapy for unsteadiness, walking instability, fatigue. His PMH includes paroxysmal a fib, MS, lymphedema, hypothyroidism, Graves Disease, arthritis, lumbar herniated disc (L5), depression and neuropathy. Patient first developed symptoms of MS in 2005 and was diagnosed in 2006. Patient has done PT in the past, but hasn't been seen since 2020 at [facility] Has not been doing his exercises in the past year. Walk outside to garage to ride lawnmower, walks in house. 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   PAIN:  Are you having pain? No    TODAY'S TREATMENT:  Neuro Re-ed:  Airex pad: -visual scans with reaching outside BOS for word game x 4 minutes  Lateral Step over orange hurdle and back 10x each side with BUE  Forward step over orange hurdle and back SUE support 10x each LE  Therex: Squat 15x Lateral squat 15x each side with BUE support.  Standing cowboy in/out 10x each LE  Standing heel raise 15x  Seated  hamstring isometric contraction 15x 3 second holds BTB abduction 15x   Education provided throughout  session via VC/TC and demonstration to facilitate movement at target joints and correct muscle activation for all testing and exercises performed.   PATIENT EDUCATION: Education details: exercise technique, body mechanics Person educated: Patient Education method: Customer service manager, verbal cues Education comprehension: verbalized understanding, returned demonstration    HOME EXERCISE PROGRAM: No changes this session   PT Short Term Goals -       PT SHORT TERM GOAL #1   Title Patient will be independent in home exercise program to improve strength/mobility for better functional independence with ADLs.    Baseline 8/9: HEP to be given next session, 10/31: patient reports compliance with HEP and would like progressions added next session. 08/22/2021=Patient verbalized that he is walking and doing his LE exercises with no questions at this time.    Time 4    Period Weeks    Status Achieved    Target Date 08/16/21              PT Long Term Goals       PT LONG TERM GOAL #1   Title Patient will increase FOTO score to equal to or greater than 70%  to demonstrate statistically significant improvement in mobility and quality of life.    Baseline 8/9: 64%; risk adjusted 34%; 03/21/21 FOTO: 45; 04/24/21 FOTO: 49% ; 08/22/2021= 56% 4/20: 60% 11/28/21: 63%; 01/04/2022= 60%   Time 12    Period Weeks    Status On-going    Target Date 03/29/2022     PT LONG TERM GOAL #2   Title Patient will increase Berg Balance score by > 6 points to demonstrate decreased fall risk during functional activities.    Baseline 4/20: 43/56 6/6: 48/56; 713/2023= 50/56   Time 12    Period Weeks    Status Achieved   Target Date 03/29/2022     PT LONG TERM GOAL #3   Title Patient will increase 10 meter walk test to >1.79ms as to improve gait speed for better community ambulation and to reduce fall risk.    Baseline 8/9: 0.65 m/s with rollator; 10/31: 0.68 m/s with rollator. 07/06/2021= 0.71 m/s using  rollator; 08/22/2021= 0.94 m/s using rollator. 4/20: 1.1 m/s with rollator    Time 12    Period Weeks    Status Achieved    Target Date 10/11/21      PT LONG TERM GOAL #4   Title Patient will increase six minute walk test distance to >1000 for progression to community ambulator and improve gait ability    Baseline 8/9: 505 ft with rollator; 03/21/21: [id number] rollator, 04/24/21: [id number] rollator; 07/06/2021=900 feet; 08/22/2021= 935 feet with use of Rollator 44/20: 940 ft 11/28/21: 1055 feet with rollator    Time 12    Period Weeks    Status Achieved    Target Date 01/04/22  PT LONG TERM GOAL #5   Title Patient will increase glute medius strength on Left LE from 3-/5 to 4/5 to improve stability, gait mechanics, and functional strength.    Baseline 10/31: 3-/5; 07/06/2021= 3-/5; 08/22/2021=3-/5 unable to achieve full ROM yet able to withstand some resistance with testing 4/20: unable to obtainfull ROM against gravity 6/6: unable to move against gravity; 01/04/2022= Continues to have difficulty - unable to raise left LE in sidelye against gravity but able to perform supine left Hip abd with some resistance= 3-/5   Time 12    Period Weeks    Status On-going    Target Date 03/29/2022     PT LONG TERM GOAL #6   Title Patient will increase Left single leg stance time to 15 seconds or greater to increase safety in shower and independence with ADLs.    Baseline 10/31; 1.83 sec without UE support; 08/22/2021=2-3  sec  on left; 6 sec on right 4/20: RLE 10 seconds LLE unable to perform 6/6: R LE 4 seconds during BERG; 01/04/2022= left = 3 sec and right = 8 sec   Time 12    Period Weeks    Status On-going    Target Date 03/29/2022     PT LONG TERM GOAL #7   Title Patient will safely negotiate 13 steps with handrails to safely enter/exit mothers house.    Baseline 4/20: very challenging 6/6: able to copmlete safely even managing dogs per pt report; 01/04/2022- Patient performed 16 steps with B Rail-no  difficulty other than fatigue.    Time 12    Period Weeks    Status Achieved   Target Date 01/04/22              Plan - 12/13/21 1340     Clinical Impression Statement Patient tolerates progressive stability and strengthening interventions well without pain increase.  Intermittent rest breaks required throughout session due to fatigue. Patient will benefit from skilled PT services to improve his LE strength, balance and overall mobility for decreased risk of falling and improved quality of life.    Personal Factors and Comorbidities Age;Comorbidity 3+;Fitness;Past/Current Experience;Time since onset of injury/illness/exacerbation    Comorbidities aroxysmal a fib, MS, lymphedema, hypothyroidism, Graves Disease, arthritis, lumbar herniated disc (L5), depression and neuropathy    Examination-Activity Limitations Bed Mobility;Bend;Caring for Others;Carry;Reach Overhead;Locomotion Level;Lift;Hygiene/Grooming;Dressing;Squat;Stairs;Stand;Toileting;Transfers    Examination-Participation Restrictions Cleaning;Community Activity;Laundry;Occupation;Shop;Volunteer;Yard Work    Merchant navy officer Evolving/Moderate complexity    Rehab Potential Fair    PT Frequency 2x / week    PT Duration 12 weeks    PT Treatment/Interventions ADLs/Self Care Home Management;Aquatic Therapy;Canalith Repostioning;Biofeedback;Cryotherapy;Ultrasound;Traction;Moist Heat;Electrical Stimulation;DME Instruction;Gait training;Therapeutic exercise;Therapeutic activities;Functional mobility training;Stair training;Balance training;Neuromuscular re-education;Patient/family education;Manual techniques;Orthotic Fit/Training;Compression bandaging;Passive range of motion;Vestibular;Taping;Splinting;Energy conservation;Dry needling;Visual/perceptual remediation/compensation    PT Next Visit Plan Continue with progessive balance training, Progressive LE strenthening as appropriate.    PT Home Exercise Plan Access Code:  [email or web address] URL: [email or web address]  Added Bridges with BTB and encouraged more walking around the home during commerical breaks when watching TV.    Consulted and Agree with Plan of Care Patient              Janna Arch PT   12/13/2021, 1:41 PM

## 2022-02-13 ENCOUNTER — Ambulatory Visit: Payer: Medicare PPO

## 2022-02-13 DIAGNOSIS — M6281 Muscle weakness (generalized): Secondary | ICD-10-CM | POA: Diagnosis not present

## 2022-02-13 DIAGNOSIS — R2681 Unsteadiness on feet: Secondary | ICD-10-CM

## 2022-02-13 DIAGNOSIS — R2689 Other abnormalities of gait and mobility: Secondary | ICD-10-CM

## 2022-02-13 DIAGNOSIS — R278 Other lack of coordination: Secondary | ICD-10-CM | POA: Diagnosis not present

## 2022-02-13 DIAGNOSIS — R269 Unspecified abnormalities of gait and mobility: Secondary | ICD-10-CM

## 2022-02-13 DIAGNOSIS — R262 Difficulty in walking, not elsewhere classified: Secondary | ICD-10-CM

## 2022-02-13 NOTE — Therapy (Unsigned)
OUTPATIENT PHYSICAL THERAPY TREATMENT NOTE/Physical Therapy Progress Note   Dates of reporting period  11/28/2021   to   02/13/2022  Patient Name: Dustin Gray MRN: [id number] DOB:[birth year], 66 y.o., male 59 Date: 02/14/2022  PCP: [name], MD REFERRING PROVIDER: [name], MD   PT End of Session - 02/13/22 1435     Visit Number 38    Number of Visits 55    Date for PT Re-Evaluation 04/24/22    Authorization Type [place] primary    Authorization Time Period 08/23/21-11/23/21 24 visits    Authorization - Number of Visits 24    Progress Note Due on Visit 70    PT Start Time 1430    PT Stop Time 1512    PT Time Calculation (min) 42 min    Equipment Utilized During Treatment Gait belt    Activity Tolerance Patient tolerated treatment well;No increased pain;Patient limited by fatigue    Behavior During Therapy [facility] for tasks assessed/performed               Past Medical History:  Diagnosis Date   Abscess    groin   Arthritis    lower spine   Erectile dysfunction    Low testosterone    Lumbar herniated disc    L5   Multiple sclerosis ([place])    Staph aureus infection    Past Surgical History:  Procedure Laterality Date   CARDIOVERSION N/A 11/22/2021   Procedure: CARDIOVERSION;  Surgeon: [name], MD;  Location: [facility];  Service: Cardiovascular;  Laterality: N/A;   COLONOSCOPY WITH PROPOFOL N/A 07/04/2016   Procedure: COLONOSCOPY WITH PROPOFOL;  Surgeon: [name], MD;  Location: [place];  Service: Endoscopy;  Laterality: N/A;   [place]   POLYPECTOMY  07/04/2016   Procedure: POLYPECTOMY;  Surgeon: [name], MD;  Location: [place];  Service: Endoscopy;;   TONSILLECTOMY     Patient Active Problem List   Diagnosis Date Noted   Persistent atrial fibrillation ([place])    Graves disease    Abdominal bloating    Atrial fibrillation with RVR ([place]) 10/01/2019    Hyperthyroidism    Atrial fibrillation with rapid ventricular response ([place]) 09/30/2019   Leg edema    Left leg cellulitis    Special screening for malignant neoplasms, colon    Polyp of sigmoid colon    Benign neoplasm of ascending colon    Rectal polyp    Herniated nucleus pulposus, L5-S1 11/09/2015   Low back pain 10/25/2015   Herniated nucleus pulposus, C5-6 right 10/06/2015   Dysesthesia 09/16/2015   Spasticity 09/16/2015   Unsteady gait 09/16/2015   Multiple sclerosis ([place]) 06/08/2011     REFERRING DIAG: R26.81 (ICD-10-CM) - Unsteadiness on feet   THERAPY DIAG:  Unsteadiness on feet  Difficulty in walking, not elsewhere classified  Abnormality of gait and mobility  Muscle weakness (generalized)  Rationale for Evaluation and Treatment Rehabilitation  PERTINENT HISTORY: Patient presents to physical therapy for unsteadiness, walking instability, fatigue. His PMH includes paroxysmal a fib, MS, lymphedema, hypothyroidism, Graves Disease, arthritis, lumbar herniated disc (L5), depression and neuropathy. Patient first developed symptoms of MS in 2005 and was diagnosed in 2006. Patient has done PT in the past, but hasn't been seen since 2020 at [facility]. Has not been doing his exercises in the past year. Walk outside to garage to ride lawnmower, walks in house. Retired Automotive engineer. Has a  rollator at home but doesn't use it in the house   PRECAUTIONS: Fall  SUBJECTIVE: Patient reports he was able to go to beach over the weekend and enjoyed spending time with family.  PAIN:  Are you having pain? No    TODAY'S TREATMENT:   Reassessed goals for progress note #70- See goal section for details.    Therex: Squat 15x Standing heel raise 15x Standing TKE with matrix system Standing Hip swings- Flex/ext x 15 reps  Standing hip swings- ABD/ADD x 15 reps Standing on edge of 6 " block- hip hike QL x 12 reps each LE.   Sidelye:  - Hip ER (clamshell) x 12 reps L  LE - hip Abd (approx 4 in off mat with LLE) - x 12 reps   Education provided throughout session via VC/TC and demonstration to facilitate movement at target joints and correct muscle activation for all testing and exercises performed.   PATIENT EDUCATION: Education details: exercise technique, body mechanics Person educated: Patient Education method: Customer service manager, verbal cues Education comprehension: verbalized understanding, returned demonstration    HOME EXERCISE PROGRAM: No changes this session   PT Short Term Goals -       PT SHORT TERM GOAL #1   Title Patient will be independent in home exercise program to improve strength/mobility for better functional independence with ADLs.    Baseline 8/9: HEP to be given next session, 10/31: patient reports compliance with HEP and would like progressions added next session. 08/22/2021=Patient verbalized that he is walking and doing his LE exercises with no questions at this time.    Time 4    Period Weeks    Status Achieved    Target Date 08/16/21              PT Long Term Goals       PT LONG TERM GOAL #1   Title Patient will increase FOTO score to equal to or greater than 70%  to demonstrate statistically significant improvement in mobility and quality of life.    Baseline 8/9: 64%; risk adjusted 34%; 03/21/21 FOTO: 45; 04/24/21 FOTO: 49% ; 08/22/2021= 56% 4/20: 60% 11/28/21: 63%; 01/04/2022= 60%   Time 12    Period Weeks    Status On-going    Target Date 03/29/2022     PT LONG TERM GOAL #2   Title Patient will increase Berg Balance score by > 6 points to demonstrate decreased fall risk during functional activities.    Baseline 4/20: 43/56 6/6: 48/56; 713/2023= 50/56   Time 12    Period Weeks    Status Achieved   Target Date 03/29/2022     PT LONG TERM GOAL #3   Title Patient will increase 10 meter walk test to >1.72ms as to improve gait speed for better community ambulation and to reduce fall risk.     Baseline 8/9: 0.65 m/s with rollator; 10/31: 0.68 m/s with rollator. 07/06/2021= 0.71 m/s using rollator; 08/22/2021= 0.94 m/s using rollator. 4/20: 1.1 m/s with rollator    Time 12    Period Weeks    Status Achieved    Target Date 10/11/21      PT LONG TERM GOAL #4   Title Patient will increase six minute walk test distance to >1000 for progression to community ambulator and improve gait ability    Baseline 8/9: 505 ft with rollator; 03/21/21: [id number] rollator, 04/24/21: 75568f rollator; 07/06/2021=900 feet; 08/22/2021= 935 feet with use of Rollator 44/20: 940 ft  11/28/21: 1055 feet with rollator    Time 12    Period Weeks    Status Achieved    Target Date 01/04/22      PT LONG TERM GOAL #5   Title Patient will increase glute medius strength on Left LE from 3-/5 to 4/5 to improve stability, gait mechanics, and functional strength.    Baseline 10/31: 3-/5; 07/06/2021= 3-/5; 08/22/2021=3-/5 unable to achieve full ROM yet able to withstand some resistance with testing 4/20: unable to obtainfull ROM against gravity 6/6: unable to move against gravity; 01/04/2022= Continues to have difficulty - unable to raise left LE in sidelye against gravity but able to perform supine left Hip abd with some resistance= 3-/5   Time 12    Period Weeks    Status On-going    Target Date 03/29/2022     PT LONG TERM GOAL #6   Title Patient will increase Left single leg stance time to 15 seconds or greater to increase safety in shower and independence with ADLs.    Baseline 10/31; 1.83 sec without UE support; 08/22/2021=2-3  sec  on left; 6 sec on right 4/20: RLE 10 seconds LLE unable to perform 6/6: R LE 4 seconds during BERG; 01/04/2022= left = 3 sec and right = 8 sec; 02/13/2022= 20 sec on right at best and 4 sec on left LE at best.   Time 12    Period Weeks    Status On-going    Target Date 03/29/2022     PT LONG TERM GOAL #7   Title Patient will safely negotiate 13 steps with handrails to safely enter/exit mothers  house.    Baseline 4/20: very challenging 6/6: able to copmlete safely even managing dogs per pt report; 01/04/2022- Patient performed 16 steps with B Rail-no difficulty other than fatigue.    Time 12    Period Weeks    Status Achieved   Target Date 01/04/22              Plan - 12/13/21 1340     Clinical Impression Statement Patient presents with some improvement during testing today including more activation of glute med in sidelye- able to perform actively as previous unable and able to lift Left hip into abd in sidelye approx 4 in off mat. Patient will benefit from skilled PT services to improve his LE strength, balance and overall mobility for decreased risk of falling and improved quality of life. Patient's condition has the potential to improve in response to therapy. Maximum improvement is yet to be obtained. The anticipated improvement is attainable and reasonable in a generally predictable time.     Personal Factors and Comorbidities Age;Comorbidity 3+;Fitness;Past/Current Experience;Time since onset of injury/illness/exacerbation    Comorbidities aroxysmal a fib, MS, lymphedema, hypothyroidism, Graves Disease, arthritis, lumbar herniated disc (L5), depression and neuropathy    Examination-Activity Limitations Bed Mobility;Bend;Caring for Others;Carry;Reach Overhead;Locomotion Level;Lift;Hygiene/Grooming;Dressing;Squat;Stairs;Stand;Toileting;Transfers    Examination-Participation Restrictions Cleaning;Community Activity;Laundry;Occupation;Shop;Volunteer;Yard Work    Merchant navy officer Evolving/Moderate complexity    Rehab Potential Fair    PT Frequency 2x / week    PT Duration 12 weeks    PT Treatment/Interventions ADLs/Self Care Home Management;Aquatic Therapy;Canalith Repostioning;Biofeedback;Cryotherapy;Ultrasound;Traction;Moist Heat;Electrical Stimulation;DME Instruction;Gait training;Therapeutic exercise;Therapeutic activities;Functional mobility training;Stair  training;Balance training;Neuromuscular re-education;Patient/family education;Manual techniques;Orthotic Fit/Training;Compression bandaging;Passive range of motion;Vestibular;Taping;Splinting;Energy conservation;Dry needling;Visual/perceptual remediation/compensation    PT Next Visit Plan Continue with progessive balance training, Progressive LE strenthening as appropriate.    PT Home Exercise Plan Access Code: [email or web address] URL: [email or web address]  Added Bridges with BTB  and encouraged more walking around the home during commerical breaks when watching TV.    Consulted and Agree with Plan of Care Patient              Lewis Moccasin PT   12/13/2021, 1:41 PM

## 2022-02-15 ENCOUNTER — Ambulatory Visit: Payer: Medicare PPO

## 2022-02-15 DIAGNOSIS — R269 Unspecified abnormalities of gait and mobility: Secondary | ICD-10-CM

## 2022-02-15 DIAGNOSIS — R262 Difficulty in walking, not elsewhere classified: Secondary | ICD-10-CM

## 2022-02-15 DIAGNOSIS — R2681 Unsteadiness on feet: Secondary | ICD-10-CM | POA: Diagnosis not present

## 2022-02-15 DIAGNOSIS — M6281 Muscle weakness (generalized): Secondary | ICD-10-CM

## 2022-02-15 DIAGNOSIS — R278 Other lack of coordination: Secondary | ICD-10-CM | POA: Diagnosis not present

## 2022-02-15 DIAGNOSIS — R2689 Other abnormalities of gait and mobility: Secondary | ICD-10-CM

## 2022-02-15 NOTE — Therapy (Signed)
OUTPATIENT PHYSICAL THERAPY TREATMENT NOTE  Patient Name: Dustin Gray MRN: [id number] DOB:[birth year], 66 y.o., male 48 Date: 02/15/2022  PCP: [name], MD REFERRING PROVIDER: [name], MD   PT End of Session - 02/15/22 1436     Visit Number 71    Number of Visits 90    Date for PT Re-Evaluation 04/24/22    Authorization Type [employer] Auth 11/28/21-02/22/22 for 24 visits    Authorization Time Period Cert for 11/20/39-32/4/40    Progress Note Due on Visit 80    PT Start Time 1434    PT Stop Time 1514    PT Time Calculation (min) 40 min    Equipment Utilized During Treatment Gait belt    Activity Tolerance Patient tolerated treatment well;No increased pain;Patient limited by fatigue    Behavior During Therapy [facility] for tasks assessed/performed               Past Medical History:  Diagnosis Date   Abscess    groin   Arthritis    lower spine   Erectile dysfunction    Low testosterone    Lumbar herniated disc    L5   Multiple sclerosis ([place])    Staph aureus infection    Past Surgical History:  Procedure Laterality Date   CARDIOVERSION N/A 11/22/2021   Procedure: CARDIOVERSION;  Surgeon: [name], MD;  Location: [facility];  Service: Cardiovascular;  Laterality: N/A;   COLONOSCOPY WITH PROPOFOL N/A 07/04/2016   Procedure: COLONOSCOPY WITH PROPOFOL;  Surgeon: [name], MD;  Location: [place];  Service: Endoscopy;  Laterality: N/A;   [place]   POLYPECTOMY  07/04/2016   Procedure: POLYPECTOMY;  Surgeon: [name], MD;  Location: [place];  Service: Endoscopy;;   TONSILLECTOMY     Patient Active Problem List   Diagnosis Date Noted   Persistent atrial fibrillation ([place])    Graves disease    Abdominal bloating    Atrial fibrillation with RVR ([place]) 10/01/2019   Hyperthyroidism    Atrial fibrillation with rapid ventricular response ([place]) 09/30/2019   Leg edema    Left leg  cellulitis    Special screening for malignant neoplasms, colon    Polyp of sigmoid colon    Benign neoplasm of ascending colon    Rectal polyp    Herniated nucleus pulposus, L5-S1 11/09/2015   Low back pain 10/25/2015   Herniated nucleus pulposus, C5-6 right 10/06/2015   Dysesthesia 09/16/2015   Spasticity 09/16/2015   Unsteady gait 09/16/2015   Multiple sclerosis ([place]) 06/08/2011     REFERRING DIAG: R26.81 (ICD-10-CM) - Unsteadiness on feet   THERAPY DIAG:  Unsteadiness on feet  Difficulty in walking, not elsewhere classified  Abnormality of gait and mobility  Muscle weakness (generalized)  Rationale for Evaluation and Treatment Rehabilitation  PERTINENT HISTORY: Patient presents to physical therapy for unsteadiness, walking instability, fatigue. His PMH includes paroxysmal a fib, MS, lymphedema, hypothyroidism, Graves Disease, arthritis, lumbar herniated disc (L5), depression and neuropathy. Patient first developed symptoms of MS in 2005 and was diagnosed in 2006. Patient has done PT in the past, but hasn't been seen since 2020 at [facility]. Has not been doing his exercises in the past year. Walk outside to garage to ride lawnmower, walks in house. 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 No updates  since last session. Has been ben working in the yard quite a bit.   PAIN:  Are you having pain? No    TODAY'S TREATMENT:  -Overground AMB x 6 minutes with bari rollator *seated recovery x3 minutes -Overground AMB x 6 minutes with bari rollator -Standing heel raise 15x, 4ww support  -STS from elevated surface x10, hands free  -seated step-over dumbbell marching 1x15 bilat  -STS from elevated surface x10, hands free     Education provided throughout session via VC/TC and demonstration to facilitate movement at target joints and correct muscle activation for all  testing and exercises performed.   PATIENT EDUCATION: Education details: exercise technique, body mechanics Person educated: Patient Education method: Customer service manager, verbal cues Education comprehension: verbalized understanding, returned demonstration    HOME EXERCISE PROGRAM: No changes this session   PT Short Term Goals -       PT SHORT TERM GOAL #1   Title Patient will be independent in home exercise program to improve strength/mobility for better functional independence with ADLs.    Baseline 8/9: HEP to be given next session, 10/31: patient reports compliance with HEP and would like progressions added next session. 08/22/2021=Patient verbalized that he is walking and doing his LE exercises with no questions at this time.    Time 4    Period Weeks    Status Achieved    Target Date 08/16/21              PT Long Term Goals       PT LONG TERM GOAL #1   Title Patient will increase FOTO score to equal to or greater than 70%  to demonstrate statistically significant improvement in mobility and quality of life.    Baseline 8/9: 64%; risk adjusted 34%; 03/21/21 FOTO: 45; 04/24/21 FOTO: 49% ; 08/22/2021= 56% 4/20: 60% 11/28/21: 63%; 01/04/2022= 60%   Time 12    Period Weeks    Status On-going    Target Date 03/29/2022     PT LONG TERM GOAL #2   Title Patient will increase Berg Balance score by > 6 points to demonstrate decreased fall risk during functional activities.    Baseline 4/20: 43/56 6/6: 48/56; 713/2023= 50/56   Time 12    Period Weeks    Status Achieved   Target Date 03/29/2022     PT LONG TERM GOAL #3   Title Patient will increase 10 meter walk test to >1.109ms as to improve gait speed for better community ambulation and to reduce fall risk.    Baseline 8/9: 0.65 m/s with rollator; 10/31: 0.68 m/s with rollator. 07/06/2021= 0.71 m/s using rollator; 08/22/2021= 0.94 m/s using rollator. 4/20: 1.1 m/s with rollator    Time 12    Period Weeks    Status  Achieved    Target Date 10/11/21      PT LONG TERM GOAL #4   Title Patient will increase six minute walk test distance to >1000 for progression to community ambulator and improve gait ability    Baseline 8/9: 505 ft with rollator; 03/21/21: [id number] rollator, 04/24/21: [id number] rollator; 07/06/2021=900 feet; 08/22/2021= 935 feet with use of Rollator 44/20: 940 ft 11/28/21: 1055 feet with rollator    Time 12    Period Weeks    Status Achieved    Target Date 01/04/22      PT LONG TERM GOAL #5   Title Patient will increase glute medius strength on Left LE from 3-/5 to 4/5 to  improve stability, gait mechanics, and functional strength.    Baseline 10/31: 3-/5; 07/06/2021= 3-/5; 08/22/2021=3-/5 unable to achieve full ROM yet able to withstand some resistance with testing 4/20: unable to obtainfull ROM against gravity 6/6: unable to move against gravity; 01/04/2022= Continues to have difficulty - unable to raise left LE in sidelye against gravity but able to perform supine left Hip abd with some resistance= 3-/5   Time 12    Period Weeks    Status On-going    Target Date 03/29/2022     PT LONG TERM GOAL #6   Title Patient will increase Left single leg stance time to 15 seconds or greater to increase safety in shower and independence with ADLs.    Baseline 10/31; 1.83 sec without UE support; 08/22/2021=2-3  sec  on left; 6 sec on right 4/20: RLE 10 seconds LLE unable to perform 6/6: R LE 4 seconds during BERG; 01/04/2022= left = 3 sec and right = 8 sec; 02/13/2022= 20 sec on right at best and 4 sec on left LE at best.   Time 12    Period Weeks    Status On-going    Target Date 03/29/2022     PT LONG TERM GOAL #7   Title Patient will safely negotiate 13 steps with handrails to safely enter/exit mothers house.    Baseline 4/20: very challenging 6/6: able to copmlete safely even managing dogs per pt report; 01/04/2022- Patient performed 16 steps with B Rail-no difficulty other than fatigue.    Time 12     Period Weeks    Status Achieved   Target Date 01/04/22              Plan - 12/13/21 1340     Clinical Impression Statement Heavy emphasis on sustained walking given 6MWT goals and recent detours. Pt remains very motivated. Pt has good safety awareness in general, no LOB despite frank motor deficits. Pt conitnues to make progress toward goals of treatment overall.    Personal Factors and Comorbidities Age;Comorbidity 3+;Fitness;Past/Current Experience;Time since onset of injury/illness/exacerbation    Comorbidities aroxysmal a fib, MS, lymphedema, hypothyroidism, Graves Disease, arthritis, lumbar herniated disc (L5), depression and neuropathy    Examination-Activity Limitations Bed Mobility;Bend;Caring for Others;Carry;Reach Overhead;Locomotion Level;Lift;Hygiene/Grooming;Dressing;Squat;Stairs;Stand;Toileting;Transfers    Examination-Participation Restrictions Cleaning;Community Activity;Laundry;Occupation;Shop;Volunteer;Yard Work    Merchant navy officer Evolving/Moderate complexity    Rehab Potential Fair    PT Frequency 2x / week    PT Duration 12 weeks    PT Treatment/Interventions ADLs/Self Care Home Management;Aquatic Therapy;Canalith Repostioning;Biofeedback;Cryotherapy;Ultrasound;Traction;Moist Heat;Electrical Stimulation;DME Instruction;Gait training;Therapeutic exercise;Therapeutic activities;Functional mobility training;Stair training;Balance training;Neuromuscular re-education;Patient/family education;Manual techniques;Orthotic Fit/Training;Compression bandaging;Passive range of motion;Vestibular;Taping;Splinting;Energy conservation;Dry needling;Visual/perceptual remediation/compensation    PT Next Visit Plan Continue with progessive balance training, Progressive LE strenthening as appropriate.    PT Home Exercise Plan Access Code: [email or web address] URL: [email or web address]  Added [place] with BTB and encouraged more walking around the home during commerical  breaks when watching TV.    Consulted and Agree with Plan of Care Patient              Etta Grandchild PT 2:51 PM, 02/15/22 Etta Grandchild, PT, DPT Physical Therapist - [place] [phone]     12/13/2021, 1:41 PM

## 2022-02-20 ENCOUNTER — Ambulatory Visit: Payer: Medicare PPO

## 2022-02-21 ENCOUNTER — Ambulatory Visit: Payer: Medicare PPO | Attending: Cardiology | Admitting: Cardiology

## 2022-02-21 ENCOUNTER — Encounter: Payer: Self-pay | Admitting: Cardiology

## 2022-02-21 VITALS — BP 110/60 | Ht 74.0 in | Wt 260.0 lb

## 2022-02-21 DIAGNOSIS — G35 Multiple sclerosis: Secondary | ICD-10-CM

## 2022-02-21 DIAGNOSIS — I5022 Chronic systolic (congestive) heart failure: Secondary | ICD-10-CM | POA: Diagnosis not present

## 2022-02-21 DIAGNOSIS — I4819 Other persistent atrial fibrillation: Secondary | ICD-10-CM

## 2022-02-21 NOTE — Patient Instructions (Signed)
Medication Instructions:  none *If you need a refill on your cardiac medications before your next appointment, please call your pharmacy*   Lab Work: none If you have labs (blood work) drawn today and your tests are completely normal, you will receive your results only by: [place] (if you have MyChart) OR A paper copy in the mail If you have any lab test that is abnormal or we need to change your treatment, we will call you to review the results.   Testing/Procedures: Your physician has requested that you have an echocardiogram. Echocardiography is a painless test that uses sound waves to create images of your heart. It provides your doctor with information about the size and shape of your heart and how well your heart's chambers and valves are working. This procedure takes approximately one hour. There are no restrictions for this procedure.    Follow-Up: At [facility], you and your health needs are our priority.  As part of our continuing mission to provide you with exceptional heart care, we have created designated Provider Care Teams.  These Care Teams include your primary Cardiologist (physician) and Advanced Practice Providers (APPs -  Physician Assistants and Nurse Practitioners) who all work together to provide you with the care you need, when you need it.  We recommend signing up for the patient portal called "MyChart".  Sign up information is provided on this After Visit Summary.  MyChart is used to connect with patients for Virtual Visits (Telemedicine).  Patients are able to view lab/test results, encounter notes, upcoming appointments, etc.  Non-urgent messages can be sent to your provider as well.   To learn more about what you can do with MyChart, go to [email or web address].    Your next appointment:   1 year(s)  The format for your next appointment:   In Person  Provider:   You will see one of the following Advanced Practice Providers on your  designated Care Team:   Murray Hodgkins, NP Christell Faith, PA-C Cadence Kathlen Mody, PA-C Gerrie Nordmann, NP       Important Information About Sugar

## 2022-02-21 NOTE — Addendum Note (Signed)
Addended by: Darrell Jewel on: 02/21/2022 11:29 AM   Modules accepted: Orders

## 2022-02-21 NOTE — Progress Notes (Signed)
Electrophysiology Office Follow up Visit Note:    Date:  02/21/2022   ID:  Dustin Gray, DOB Aug 20, 1955, MRN [id number]  PCP:  [name], MD  [facility] Cardiologist:  [name], MD  [facility] Electrophysiologist:  [name], MD    Interval History:    Dustin Gray is a 66 y.o. male who presents for a follow up visit. They were last seen in clinic in February 2023.  He takes Eliquis for stroke prophylaxis related to his persistent atrial fibrillation.  He is not thought to be a good candidate for catheter ablation given his medical comorbidities.  He last saw Dr. [name] on December 22, 2021 he had a cardioversion Nov 22, 2021.  On June 30, his EKG showed sinus bradycardia. Today he tells me he is feeling much better now that he is back in sinus rhythm.  His energy level has improved.  He is pleased with the results.  He continues to tolerate anticoagulation in addition to his diltiazem and propranolol.      Past Medical History:  Diagnosis Date   Abscess    groin   Arthritis    lower spine   Erectile dysfunction    Low testosterone    Lumbar herniated disc    L5   Multiple sclerosis ([place])    Staph aureus infection     Past Surgical History:  Procedure Laterality Date   CARDIOVERSION N/A 11/22/2021   Procedure: CARDIOVERSION;  Surgeon: [name], MD;  Location: [facility];  Service: Cardiovascular;  Laterality: N/A;   COLONOSCOPY WITH PROPOFOL N/A 07/04/2016   Procedure: COLONOSCOPY WITH PROPOFOL;  Surgeon: [name], MD;  Location: [place];  Service: Endoscopy;  Laterality: N/A;   [place]   POLYPECTOMY  07/04/2016   Procedure: POLYPECTOMY;  Surgeon: [name], MD;  Location: [facility];  Service: Endoscopy;;   TONSILLECTOMY      Current Medications: Current Meds  Medication Sig   apixaban (ELIQUIS) 5 MG TABS tablet Take 1 tablet (5 mg total) by mouth 2 (two) times  daily.   baclofen (LIORESAL) 10 MG tablet Take 10 mg by mouth 3 (three) times daily.   diltiazem (CARDIZEM SR) 120 MG 12 hr capsule Take 1 capsule (120 mg total) by mouth 2 (two) times daily.   Dimethyl Fumarate 240 MG CPDR Take 240 mg by mouth 2 (two) times daily.    furosemide (LASIX) 40 MG tablet Take 1 tablet (40 mg total) by mouth as needed. With your potassium.   gabapentin (NEURONTIN) 300 MG capsule Take 300-600 mg by mouth See admin instructions. Take 1 capsule (300 mg) by mouth in the morning, take 1 capsule (300 mg) by mouth at midday, & take 2 capsules (600 mg) by mouth at bedtime.   meloxicam (MOBIC) 15 MG tablet Take 15 mg by mouth daily as needed for pain.    methimazole (TAPAZOLE) 5 MG tablet Take 0.5 tablets by mouth daily.   potassium chloride (KLOR-CON) 10 MEQ tablet Take 1 tablet (10 mEq total) by mouth as needed. Take with your Lasix.   propranolol ER (INDERAL LA) 80 MG 24 hr capsule Take 1 capsule (80 mg total) by mouth 2 (two) times daily.   [DISCONTINUED] methimazole (TAPAZOLE) 10 MG tablet Take 2.5 mg by mouth in the morning.     Allergies:   Cephalexin, Ancef [cefazolin sodium], and Cefadroxil   Social History   Socioeconomic History  Marital status: Legally Separated    Spouse name: Not on file   Number of children: Not on file   Years of education: Not on file   Highest education level: Not on file  Occupational History   Not on file  Tobacco Use   Smoking status: Former   Smokeless tobacco: Never   Tobacco comments:    smoked as teenager  Vaping Use   Vaping Use: Never used  Substance and Sexual Activity   Alcohol use: Not Currently    Comment: 2 drinks/month   Drug use: No   Sexual activity: Not on file  Other Topics Concern   Not on file  Social History Narrative   Not on file   Social Determinants of Health   Financial Resource Strain: Not on file  Food Insecurity: Not on file  Transportation Needs: Not on file  Physical Activity: Not on  file  Stress: Not on file  Social Connections: Not on file     Family History: The patient's family history includes Diabetes in his father.  ROS:   Please see the history of present illness.    All other systems reviewed and are negative.  EKGs/Labs/Other Studies Reviewed:    The following studies were reviewed today:  September 13, 2021 echo Ejection fraction 40 to 45%, global hypokinesis  EKG:  The ekg ordered today demonstrates sinus rhythm with a ventricular rate of 65 bpm.  Recent Labs: 10/27/2021: BUN 13; Creatinine, Ser 0.92; Hemoglobin 15.9; Platelets 217; Potassium 4.7; Sodium 140  Recent Lipid Panel    Component Value Date/Time   CHOL 108 10/01/2019 0358   TRIG 81 10/01/2019 0358   HDL 35 (L) 10/01/2019 0358   CHOLHDL 3.1 10/01/2019 0358   VLDL 16 10/01/2019 0358   LDLCALC 57 10/01/2019 0358    Physical Exam:    VS:  BP 110/60   Ht '6\' 2"'$  (1.88 m)   Wt 260 lb (117.9 kg)   SpO2 94%   BMI 33.38 kg/m     Wt Readings from Last 3 Encounters:  02/21/22 260 lb (117.9 kg)  12/22/21 261 lb 9.6 oz (118.7 kg)  11/22/21 262 lb (118.8 kg)     GEN:  Well nourished, well developed in no acute distress HEENT: Normal NECK: No JVD; No carotid bruits LYMPHATICS: No lymphadenopathy CARDIAC: RRR, no murmurs, rubs, gallops RESPIRATORY:  Clear to auscultation without rales, wheezing or rhonchi  ABDOMEN: Soft, non-tender, non-distended MUSCULOSKELETAL:  No edema; No deformity  SKIN: Warm and dry NEUROLOGIC:  Alert and oriented x 3 PSYCHIATRIC:  Normal affect        ASSESSMENT:    1. Persistent atrial fibrillation ([place])   2. Multiple sclerosis ([place])   3. Chronic systolic heart failure (H[facility])    PLAN:    In order of problems listed above:    #Persistent atrial fibrillation In the setting of thyroid dysfunction.  Feels much better in sinus rhythm.  Rhythm control indicated. On Eliquis for stroke prophylaxis On propranolol and diltiazem  #Chronic systolic  heart failure Likely secondary to tachycardia/atrial fibrillation.  Plan to repeat echocardiogram to reassess LV function now that he has been in normal rhythm.  Follow-up 1 year with EP APP with interim follow-up with Dr. [name].   Medication Adjustments/Labs and Tests Ordered: Current medicines are reviewed at length with the patient today.  Concerns regarding medicines are outlined above.  No orders of the defined types were placed in this encounter.  No orders of the defined  types were placed in this encounter.    Signed, Lars Mage, MD, [facility], [facility] 02/21/2022 11:20 AM    Electrophysiology [facility]

## 2022-02-22 ENCOUNTER — Ambulatory Visit: Payer: Medicare PPO | Attending: Neurology

## 2022-02-22 DIAGNOSIS — R2681 Unsteadiness on feet: Secondary | ICD-10-CM | POA: Insufficient documentation

## 2022-02-22 DIAGNOSIS — M6281 Muscle weakness (generalized): Secondary | ICD-10-CM | POA: Insufficient documentation

## 2022-02-22 DIAGNOSIS — R262 Difficulty in walking, not elsewhere classified: Secondary | ICD-10-CM | POA: Insufficient documentation

## 2022-02-22 DIAGNOSIS — R269 Unspecified abnormalities of gait and mobility: Secondary | ICD-10-CM | POA: Diagnosis not present

## 2022-02-22 DIAGNOSIS — R2689 Other abnormalities of gait and mobility: Secondary | ICD-10-CM | POA: Insufficient documentation

## 2022-02-22 DIAGNOSIS — R278 Other lack of coordination: Secondary | ICD-10-CM | POA: Insufficient documentation

## 2022-02-22 NOTE — Therapy (Signed)
OUTPATIENT PHYSICAL THERAPY TREATMENT NOTE  Patient Name: Dustin Gray MRN: [id number] DOB:Jun 16, 1956, 66 y.o., male 46 Date: 02/22/2022  PCP: [name], MD REFERRING PROVIDER: [name], MD   PT End of Session - 02/22/22 1438     Visit Number 72    Number of Visits 49    Date for PT Re-Evaluation 04/24/22    Authorization Type [employer] Auth 11/28/21-02/22/22 for 24 visits    Authorization Time Period Cert for 01/31/97-33/8/25    Progress Note Due on Visit 80    PT Start Time 1432    PT Stop Time 1513    PT Time Calculation (min) 41 min    Equipment Utilized During Treatment Gait belt    Activity Tolerance Patient tolerated treatment well;No increased pain;Patient limited by fatigue    Behavior During Therapy [facility] for tasks assessed/performed                Past Medical History:  Diagnosis Date   Abscess    groin   Arthritis    lower spine   Erectile dysfunction    Low testosterone    Lumbar herniated disc    L5   Multiple sclerosis ([place])    Staph aureus infection    Past Surgical History:  Procedure Laterality Date   CARDIOVERSION N/A 11/22/2021   Procedure: CARDIOVERSION;  Surgeon: [name], MD;  Location: [facility];  Service: Cardiovascular;  Laterality: N/A;   COLONOSCOPY WITH PROPOFOL N/A 07/04/2016   Procedure: COLONOSCOPY WITH PROPOFOL;  Surgeon: [name], MD;  Location: [place];  Service: Endoscopy;  Laterality: N/A;   [place]   POLYPECTOMY  07/04/2016   Procedure: POLYPECTOMY;  Surgeon: [name], MD;  Location: [place];  Service: Endoscopy;;   TONSILLECTOMY     Patient Active Problem List   Diagnosis Date Noted   Persistent atrial fibrillation ([place])    Graves disease    Abdominal bloating    Atrial fibrillation with RVR ([place]) 10/01/2019   Hyperthyroidism    Atrial fibrillation with rapid ventricular response ([place]) 09/30/2019   Leg edema    Left  leg cellulitis    Special screening for malignant neoplasms, colon    Polyp of sigmoid colon    Benign neoplasm of ascending colon    Rectal polyp    Herniated nucleus pulposus, L5-S1 11/09/2015   Low back pain 10/25/2015   Herniated nucleus pulposus, C5-6 right 10/06/2015   Dysesthesia 09/16/2015   Spasticity 09/16/2015   Unsteady gait 09/16/2015   Multiple sclerosis ([place]) 06/08/2011     REFERRING DIAG: R26.81 (ICD-10-CM) - Unsteadiness on feet   THERAPY DIAG:  Unsteadiness on feet  Difficulty in walking, not elsewhere classified  Abnormality of gait and mobility  Muscle weakness (generalized)  Rationale for Evaluation and Treatment Rehabilitation  PERTINENT HISTORY: Patient presents to physical therapy for unsteadiness, walking instability, fatigue. His PMH includes paroxysmal a fib, MS, lymphedema, hypothyroidism, Graves Disease, arthritis, lumbar herniated disc (L5), depression and neuropathy. Patient first developed symptoms of MS in 2005 and was diagnosed in 2006. Patient has done PT in the past, but hasn't been seen since 2020 at [facility]. Has not been doing his exercises in the past year. Walk outside to garage to ride lawnmower, walks in house. Retired Automotive engineer. Has a rollator at home but doesn't use it in the house   PRECAUTIONS: Fall  SUBJECTIVE: Pt reports has been busy but doing well  overall.  PAIN:  Are you having pain? No    TODAY'S TREATMENT:   THEREX:   -Standing on airex pad with BUE Support x 20 reps each -Standing donkey kick (minimal ability to flex left knee)  -seated ankle IV/EV with Yellow TB- (minimal ability to move foot) x 15 reps each -Seated B hip march x 15 reps each LE -Seated calf raises x 10 reps x 2 sets (minimal ability on left LE)  -Seated knee ext with 5 sec hold B LE (concentration on eccentric control)  -Overground AMB x 6 minutes with bariatric 4WW- approx 700 feet -STS from elevated surface ( on airex pad)  x10,  hands free      Education provided throughout session via VC/TC and demonstration to facilitate movement at target joints and correct muscle activation for all testing and exercises performed.   PATIENT EDUCATION: Education details: exercise technique, body mechanics Person educated: Patient Education method: Customer service manager, verbal cues Education comprehension: verbalized understanding, returned demonstration    HOME EXERCISE PROGRAM: No changes this session   PT Short Term Goals -       PT SHORT TERM GOAL #1   Title Patient will be independent in home exercise program to improve strength/mobility for better functional independence with ADLs.    Baseline 8/9: HEP to be given next session, 10/31: patient reports compliance with HEP and would like progressions added next session. 08/22/2021=Patient verbalized that he is walking and doing his LE exercises with no questions at this time.    Time 4    Period Weeks    Status Achieved    Target Date 08/16/21              PT Long Term Goals       PT LONG TERM GOAL #1   Title Patient will increase FOTO score to equal to or greater than 70%  to demonstrate statistically significant improvement in mobility and quality of life.    Baseline 8/9: 64%; risk adjusted 34%; 03/21/21 FOTO: 45; 04/24/21 FOTO: 49% ; 08/22/2021= 56% 4/20: 60% 11/28/21: 63%; 01/04/2022= 60%   Time 12    Period Weeks    Status On-going    Target Date 03/29/2022     PT LONG TERM GOAL #2   Title Patient will increase Berg Balance score by > 6 points to demonstrate decreased fall risk during functional activities.    Baseline 4/20: 43/56 6/6: 48/56; 713/2023= 50/56   Time 12    Period Weeks    Status Achieved   Target Date 03/29/2022     PT LONG TERM GOAL #3   Title Patient will increase 10 meter walk test to >1.25ms as to improve gait speed for better community ambulation and to reduce fall risk.    Baseline 8/9: 0.65 m/s with rollator; 10/31:  0.68 m/s with rollator. 07/06/2021= 0.71 m/s using rollator; 08/22/2021= 0.94 m/s using rollator. 4/20: 1.1 m/s with rollator    Time 12    Period Weeks    Status Achieved    Target Date 10/11/21      PT LONG TERM GOAL #4   Title Patient will increase six minute walk test distance to >1000 for progression to community ambulator and improve gait ability    Baseline 8/9: 505 ft with rollator; 03/21/21: 7757fc rollator, 04/24/21: 75567f rollator; 07/06/2021=900 feet; 08/22/2021= 935 feet with use of Rollator 44/20: 940 ft 11/28/21: 1055 feet with rollator    Time 12  Period Weeks    Status Achieved    Target Date 01/04/22      PT LONG TERM GOAL #5   Title Patient will increase glute medius strength on Left LE from 3-/5 to 4/5 to improve stability, gait mechanics, and functional strength.    Baseline 10/31: 3-/5; 07/06/2021= 3-/5; 08/22/2021=3-/5 unable to achieve full ROM yet able to withstand some resistance with testing 4/20: unable to obtainfull ROM against gravity 6/6: unable to move against gravity; 01/04/2022= Continues to have difficulty - unable to raise left LE in sidelye against gravity but able to perform supine left Hip abd with some resistance= 3-/5   Time 12    Period Weeks    Status On-going    Target Date 03/29/2022     PT LONG TERM GOAL #6   Title Patient will increase Left single leg stance time to 15 seconds or greater to increase safety in shower and independence with ADLs.    Baseline 10/31; 1.83 sec without UE support; 08/22/2021=2-3  sec  on left; 6 sec on right 4/20: RLE 10 seconds LLE unable to perform 6/6: R LE 4 seconds during BERG; 01/04/2022= left = 3 sec and right = 8 sec; 02/13/2022= 20 sec on right at best and 4 sec on left LE at best.   Time 12    Period Weeks    Status On-going    Target Date 03/29/2022     PT LONG TERM GOAL #7   Title Patient will safely negotiate 13 steps with handrails to safely enter/exit mothers house.    Baseline 4/20: very challenging 6/6:  able to copmlete safely even managing dogs per pt report; 01/04/2022- Patient performed 16 steps with B Rail-no difficulty other than fatigue.    Time 12    Period Weeks    Status Achieved   Target Date 01/04/22              Plan - 12/13/21 1340     Clinical Impression Statement Patient continues to present with Left LE muscle weakness but responds well to all instruction and therex today. He was appropriately tired but able to accomplish all therex well and no difficulty with sit to stand transfers today. He demo improved distance with 6 min walk today as well.  Pt conitnues to make progress toward goals of treatment overall.    Personal Factors and Comorbidities Age;Comorbidity 3+;Fitness;Past/Current Experience;Time since onset of injury/illness/exacerbation    Comorbidities aroxysmal a fib, MS, lymphedema, hypothyroidism, Graves Disease, arthritis, lumbar herniated disc (L5), depression and neuropathy    Examination-Activity Limitations Bed Mobility;Bend;Caring for Others;Carry;Reach Overhead;Locomotion Level;Lift;Hygiene/Grooming;Dressing;Squat;Stairs;Stand;Toileting;Transfers    Examination-Participation Restrictions Cleaning;Community Activity;Laundry;Occupation;Shop;Volunteer;Yard Work    Merchant [employer] officer Evolving/Moderate complexity    Rehab Potential Fair    PT Frequency 2x / week    PT Duration 12 weeks    PT Treatment/Interventions ADLs/Self Care Home Management;Aquatic Therapy;Canalith Repostioning;Biofeedback;Cryotherapy;Ultrasound;Traction;Moist Heat;Electrical Stimulation;DME Instruction;Gait training;Therapeutic exercise;Therapeutic activities;Functional mobility training;Stair training;Balance training;Neuromuscular re-education;Patient/family education;Manual techniques;Orthotic Fit/Training;Compression bandaging;Passive range of motion;Vestibular;Taping;Splinting;Energy conservation;Dry needling;Visual/perceptual remediation/compensation    PT Next Visit  Plan Continue with progessive balance training, Progressive LE strenthening as appropriate.    PT Home Exercise Plan Access Code: [email or web address] URL: [email or web address]  Added Bridges with BTB and encouraged more walking around the home during commerical breaks when watching TV.    Consulted and Agree with Plan of Care Patient              Lewis Moccasin PT 5:12 PM, 02/22/22 Physical Therapist - Larence Penning  [place] [phone]     12/13/2021, 1:41 PM

## 2022-02-27 ENCOUNTER — Ambulatory Visit: Payer: Medicare PPO | Attending: Neurology

## 2022-02-27 DIAGNOSIS — R2689 Other abnormalities of gait and mobility: Secondary | ICD-10-CM | POA: Diagnosis not present

## 2022-02-27 DIAGNOSIS — R269 Unspecified abnormalities of gait and mobility: Secondary | ICD-10-CM | POA: Diagnosis not present

## 2022-02-27 DIAGNOSIS — M6281 Muscle weakness (generalized): Secondary | ICD-10-CM | POA: Insufficient documentation

## 2022-02-27 DIAGNOSIS — R2681 Unsteadiness on feet: Secondary | ICD-10-CM | POA: Diagnosis not present

## 2022-02-27 DIAGNOSIS — R278 Other lack of coordination: Secondary | ICD-10-CM | POA: Diagnosis not present

## 2022-02-27 DIAGNOSIS — I890 Lymphedema, not elsewhere classified: Secondary | ICD-10-CM | POA: Diagnosis not present

## 2022-02-27 DIAGNOSIS — R262 Difficulty in walking, not elsewhere classified: Secondary | ICD-10-CM | POA: Diagnosis not present

## 2022-02-27 NOTE — Therapy (Signed)
OUTPATIENT PHYSICAL THERAPY TREATMENT NOTE  Patient Name: Dustin Gray MRN: [id number] DOB:10/08/1955, 66 y.o., male 30 Date: 02/27/2022  PCP: [name], MD REFERRING PROVIDER: [name], MD   PT End of Session - 02/27/22 1348     Visit Number 73    Number of Visits 36    Date for PT Re-Evaluation 04/24/22    Authorization Type [employer] Auth 11/28/21-02/22/22 for 24 visits    Authorization Time Period Cert for 11/09/59-60/7/37    Progress Note Due on Visit 80    PT Start Time 1342    PT Stop Time 1429    PT Time Calculation (min) 47 min    Equipment Utilized During Treatment Gait belt    Activity Tolerance Patient tolerated treatment well;No increased pain;Patient limited by fatigue    Behavior During Therapy [facility] for tasks assessed/performed                 Past Medical History:  Diagnosis Date   Abscess    groin   Arthritis    lower spine   Erectile dysfunction    Low testosterone    Lumbar herniated disc    L5   Multiple sclerosis ([place])    Staph aureus infection    Past Surgical History:  Procedure Laterality Date   CARDIOVERSION N/A 11/22/2021   Procedure: CARDIOVERSION;  Surgeon: [name], MD;  Location: [facility];  Service: Cardiovascular;  Laterality: N/A;   COLONOSCOPY WITH PROPOFOL N/A 07/04/2016   Procedure: COLONOSCOPY WITH PROPOFOL;  Surgeon: [name], MD;  Location: [place];  Service: Endoscopy;  Laterality: N/A;   [place]   POLYPECTOMY  07/04/2016   Procedure: POLYPECTOMY;  Surgeon: [name], MD;  Location: [place];  Service: Endoscopy;;   TONSILLECTOMY     Patient Active Problem List   Diagnosis Date Noted   Persistent atrial fibrillation ([place])    Graves disease    Abdominal bloating    Atrial fibrillation with RVR ([place]) 10/01/2019   Hyperthyroidism    Atrial fibrillation with rapid ventricular response ([place]) 09/30/2019   Leg edema    Left  leg cellulitis    Special screening for malignant neoplasms, colon    Polyp of sigmoid colon    Benign neoplasm of ascending colon    Rectal polyp    Herniated nucleus pulposus, L5-S1 11/09/2015   Low back pain 10/25/2015   Herniated nucleus pulposus, C5-6 right 10/06/2015   Dysesthesia 09/16/2015   Spasticity 09/16/2015   Unsteady gait 09/16/2015   Multiple sclerosis ([place]) 06/08/2011     REFERRING DIAG: R26.81 (ICD-10-CM) - Unsteadiness on feet   THERAPY DIAG:  Unsteadiness on feet  Difficulty in walking, not elsewhere classified  Abnormality of gait and mobility  Muscle weakness (generalized)  Rationale for Evaluation and Treatment Rehabilitation  PERTINENT HISTORY: Patient presents to physical therapy for unsteadiness, walking instability, fatigue. His PMH includes paroxysmal a fib, MS, lymphedema, hypothyroidism, Graves Disease, arthritis, lumbar herniated disc (L5), depression and neuropathy. Patient first developed symptoms of MS in 2005 and was diagnosed in 2006. Patient has done PT in the past, but hasn't been seen since 2020 at [facility]. Has not been doing his exercises in the past year. Walk outside to garage to ride lawnmower, walks in house. Retired Automotive engineer. Has a rollator at home but doesn't use it in the house   PRECAUTIONS: Fall  SUBJECTIVE: Pt reports doing okay- states was able  to mow over the weekend   PAIN:  Are you having pain? No    TODAY'S TREATMENT:   THEREX:   -Standing step taps onto airex pad with BUE Support x 20 reps each -Standing donkey kick (minimal ability to flex left knee with 2.5 # AW and 5# AW L LE) x 15 reps  -Standing step tap (2.5# L and 5 # AW RLE)   2 sets x 15 reps each LE -Standing calf raises x 15 reps x 2 sets (minimal ability on left LE)  -Sit to stand x 20 reps- No UE Support.  -Seated knee ext with 5 sec hold B LE into ham curl (concentration on eccentric control) x 20 reps  -Overground AMB x 750 feet with  bariatric 4WW. (450 feet with 2.5 # AW on left and 5# AW on L)     Education provided throughout session via VC/TC and demonstration to facilitate movement at target joints and correct muscle activation for all testing and exercises performed.   PATIENT EDUCATION: Education details: exercise technique, body mechanics Person educated: Patient Education method: Customer service manager, verbal cues Education comprehension: verbalized understanding, returned demonstration    HOME EXERCISE PROGRAM: No changes this session   PT Short Term Goals -       PT SHORT TERM GOAL #1   Title Patient will be independent in home exercise program to improve strength/mobility for better functional independence with ADLs.    Baseline 8/9: HEP to be given next session, 10/31: patient reports compliance with HEP and would like progressions added next session. 08/22/2021=Patient verbalized that he is walking and doing his LE exercises with no questions at this time.    Time 4    Period Weeks    Status Achieved    Target Date 08/16/21              PT Long Term Goals       PT LONG TERM GOAL #1   Title Patient will increase FOTO score to equal to or greater than 70%  to demonstrate statistically significant improvement in mobility and quality of life.    Baseline 8/9: 64%; risk adjusted 34%; 03/21/21 FOTO: 45; 04/24/21 FOTO: 49% ; 08/22/2021= 56% 4/20: 60% 11/28/21: 63%; 01/04/2022= 60%   Time 12    Period Weeks    Status On-going    Target Date 03/29/2022     PT LONG TERM GOAL #2   Title Patient will increase Berg Balance score by > 6 points to demonstrate decreased fall risk during functional activities.    Baseline 4/20: 43/56 6/6: 48/56; 713/2023= 50/56   Time 12    Period Weeks    Status Achieved   Target Date 03/29/2022     PT LONG TERM GOAL #3   Title Patient will increase 10 meter walk test to >1.66ms as to improve gait speed for better community ambulation and to reduce fall risk.     Baseline 8/9: 0.65 m/s with rollator; 10/31: 0.68 m/s with rollator. 07/06/2021= 0.71 m/s using rollator; 08/22/2021= 0.94 m/s using rollator. 4/20: 1.1 m/s with rollator    Time 12    Period Weeks    Status Achieved    Target Date 10/11/21      PT LONG TERM GOAL #4   Title Patient will increase six minute walk test distance to >1000 for progression to community ambulator and improve gait ability    Baseline 8/9: 505 ft with rollator; 03/21/21: [id number] rollator, 04/24/21: 75547f  c rollator; 07/06/2021=900 feet; 08/22/2021= 935 feet with use of Rollator 44/20: 940 ft 11/28/21: 1055 feet with rollator    Time 12    Period Weeks    Status Achieved    Target Date 01/04/22      PT LONG TERM GOAL #5   Title Patient will increase glute medius strength on Left LE from 3-/5 to 4/5 to improve stability, gait mechanics, and functional strength.    Baseline 10/31: 3-/5; 07/06/2021= 3-/5; 08/22/2021=3-/5 unable to achieve full ROM yet able to withstand some resistance with testing 4/20: unable to obtainfull ROM against gravity 6/6: unable to move against gravity; 01/04/2022= Continues to have difficulty - unable to raise left LE in sidelye against gravity but able to perform supine left Hip abd with some resistance= 3-/5   Time 12    Period Weeks    Status On-going    Target Date 03/29/2022     PT LONG TERM GOAL #6   Title Patient will increase Left single leg stance time to 15 seconds or greater to increase safety in shower and independence with ADLs.    Baseline 10/31; 1.83 sec without UE support; 08/22/2021=2-3  sec  on left; 6 sec on right 4/20: RLE 10 seconds LLE unable to perform 6/6: R LE 4 seconds during BERG; 01/04/2022= left = 3 sec and right = 8 sec; 02/13/2022= 20 sec on right at best and 4 sec on left LE at best.   Time 12    Period Weeks    Status On-going    Target Date 03/29/2022     PT LONG TERM GOAL #7   Title Patient will safely negotiate 13 steps with handrails to safely enter/exit mothers  house.    Baseline 4/20: very challenging 6/6: able to copmlete safely even managing dogs per pt report; 01/04/2022- Patient performed 16 steps with B Rail-no difficulty other than fatigue.    Time 12    Period Weeks    Status Achieved   Target Date 01/04/22              Plan - 12/13/21 1340     Clinical Impression Statement Continued with POC- focusing on LE strengthening - able to increase resistance with LLE and focus on muscle control. He was able to walk with resistance some and increase overall distance today. Pt conitnues to make progress toward goals of treatment overall.    Personal Factors and Comorbidities Age;Comorbidity 3+;Fitness;Past/Current Experience;Time since onset of injury/illness/exacerbation    Comorbidities aroxysmal a fib, MS, lymphedema, hypothyroidism, Graves Disease, arthritis, lumbar herniated disc (L5), depression and neuropathy    Examination-Activity Limitations Bed Mobility;Bend;Caring for Others;Carry;Reach Overhead;Locomotion Level;Lift;Hygiene/Grooming;Dressing;Squat;Stairs;Stand;Toileting;Transfers    Examination-Participation Restrictions Cleaning;Community Activity;Laundry;Occupation;Shop;Volunteer;Yard Work    Merchant [employer] officer Evolving/Moderate complexity    Rehab Potential Fair    PT Frequency 2x / week    PT Duration 12 weeks    PT Treatment/Interventions ADLs/Self Care Home Management;Aquatic Therapy;Canalith Repostioning;Biofeedback;Cryotherapy;Ultrasound;Traction;Moist Heat;Electrical Stimulation;DME Instruction;Gait training;Therapeutic exercise;Therapeutic activities;Functional mobility training;Stair training;Balance training;Neuromuscular re-education;Patient/family education;Manual techniques;Orthotic Fit/Training;Compression bandaging;Passive range of motion;Vestibular;Taping;Splinting;Energy conservation;Dry needling;Visual/perceptual remediation/compensation    PT Next Visit Plan Continue with progessive balance training,  Progressive LE strenthening as appropriate.    PT Home Exercise Plan Access Code: [email or web address] URL: [email or web address]  Added Bridges with BTB and encouraged more walking around the home during commerical breaks when watching TV.    Consulted and Agree with Plan of Care Patient              Kathlee Nations  Chestine Belknap PT 3:16 PM, 02/27/22 Physical Therapist - [place] [phone]     12/13/2021, 1:41 PM

## 2022-03-01 ENCOUNTER — Ambulatory Visit: Payer: Medicare PPO | Admitting: Occupational Therapy

## 2022-03-01 ENCOUNTER — Ambulatory Visit: Payer: Medicare PPO

## 2022-03-01 DIAGNOSIS — R278 Other lack of coordination: Secondary | ICD-10-CM

## 2022-03-01 DIAGNOSIS — R262 Difficulty in walking, not elsewhere classified: Secondary | ICD-10-CM | POA: Diagnosis not present

## 2022-03-01 DIAGNOSIS — R2681 Unsteadiness on feet: Secondary | ICD-10-CM

## 2022-03-01 DIAGNOSIS — R269 Unspecified abnormalities of gait and mobility: Secondary | ICD-10-CM

## 2022-03-01 DIAGNOSIS — M6281 Muscle weakness (generalized): Secondary | ICD-10-CM | POA: Diagnosis not present

## 2022-03-01 DIAGNOSIS — R2689 Other abnormalities of gait and mobility: Secondary | ICD-10-CM | POA: Diagnosis not present

## 2022-03-01 DIAGNOSIS — I890 Lymphedema, not elsewhere classified: Secondary | ICD-10-CM | POA: Diagnosis not present

## 2022-03-01 NOTE — Patient Instructions (Signed)

## 2022-03-01 NOTE — Therapy (Signed)
OUTPATIENT PHYSICAL THERAPY TREATMENT NOTE  Patient Name: Dustin Gray MRN: [id number] DOB:[birth year], 66 y.o., male 62 Date: 03/01/2022  PCP: [name], MD REFERRING PROVIDER: [name], MD   PT End of Session - 03/01/22 1403     Visit Number 19    Number of Visits 46    Date for PT Re-Evaluation 04/24/22    Authorization Type [employer] Auth 11/28/21-02/22/22 for 24 visits    Authorization Time Period Cert for 3/89/37-34/2/87    Progress Note Due on Visit 80    PT Start Time 1345    Equipment Utilized During Treatment Gait belt    Activity Tolerance Patient tolerated treatment well;No increased pain;Patient limited by fatigue    Behavior During Therapy [facility] for tasks assessed/performed                 Past Medical History:  Diagnosis Date   Abscess    groin   Arthritis    lower spine   Erectile dysfunction    Low testosterone    Lumbar herniated disc    L5   Multiple sclerosis ([place])    Staph aureus infection    Past Surgical History:  Procedure Laterality Date   CARDIOVERSION N/A 11/22/2021   Procedure: CARDIOVERSION;  Surgeon: [name], MD;  Location: [facility];  Service: Cardiovascular;  Laterality: N/A;   COLONOSCOPY WITH PROPOFOL N/A 07/04/2016   Procedure: COLONOSCOPY WITH PROPOFOL;  Surgeon: [name], MD;  Location: [place];  Service: Endoscopy;  Laterality: N/A;   [place]   POLYPECTOMY  07/04/2016   Procedure: POLYPECTOMY;  Surgeon: [name], MD;  Location: [place];  Service: Endoscopy;;   TONSILLECTOMY     Patient Active Problem List   Diagnosis Date Noted   Persistent atrial fibrillation ([place])    Graves disease    Abdominal bloating    Atrial fibrillation with RVR ([place]) 10/01/2019   Hyperthyroidism    Atrial fibrillation with rapid ventricular response ([place]) 09/30/2019   Leg edema    Left leg cellulitis    Special screening for malignant  neoplasms, colon    Polyp of sigmoid colon    Benign neoplasm of ascending colon    Rectal polyp    Herniated nucleus pulposus, L5-S1 11/09/2015   Low back pain 10/25/2015   Herniated nucleus pulposus, C5-6 right 10/06/2015   Dysesthesia 09/16/2015   Spasticity 09/16/2015   Unsteady gait 09/16/2015   Multiple sclerosis ([place]) 06/08/2011     REFERRING DIAG: R26.81 (ICD-10-CM) - Unsteadiness on feet   THERAPY DIAG:  Unsteadiness on feet  Difficulty in walking, not elsewhere classified  Abnormality of gait and mobility  Muscle weakness (generalized)  Rationale for Evaluation and Treatment Rehabilitation  PERTINENT HISTORY: Patient presents to physical therapy for unsteadiness, walking instability, fatigue. His PMH includes paroxysmal a fib, MS, lymphedema, hypothyroidism, Graves Disease, arthritis, lumbar herniated disc (L5), depression and neuropathy. Patient first developed symptoms of MS in 2005 and was diagnosed in 2006. Patient has done PT in the past, but hasn't been seen since 2020 at [facility]. Has not been doing his exercises in the past year. Walk outside to garage to ride lawnmower, walks in house. 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 Denies any pain or falls.    PAIN:  Are  you having pain? No    TODAY'S TREATMENT:   THEREX:   Well zone exercises:   Knee ext- Level 2 2 sets of 12 each LE  Knee flex- Level 1 - 2 sets of 12 reps each LE   Leg press: Level 2 (each LE- 1 leg at a time) 2 sets of 10 reps  Calf press: Level 1 ( Each LE- 1 leg at a time) 2 sets of 10 reps  *Increased time spent setting up equipment and positioning patient  -Standing step taps onto airex pad with BUE Support x 20 reps each -Standing donkey kick (minimal ability to flex left knee with 2.5 # AW and 5# AW L LE) x 15 reps  -Standing  step tap (2.5# L and 5 # AW RLE)   2 sets x 15 reps each LE -Standing calf raises x 15 reps x 2 sets (minimal ability on left LE)  -Sit to stand x 20 reps- No UE Support.  -Seated knee ext with 5 sec hold B LE into ham curl (concentration on eccentric control) x 20 reps  -Overground AMB x 750 feet with bariatric 4WW. (450 feet with 2.5 # AW on left and 5# AW on L)     Education provided throughout session via VC/TC and demonstration to facilitate movement at target joints and correct muscle activation for all testing and exercises performed.   PATIENT EDUCATION: Education details: exercise technique, body mechanics Person educated: Patient Education method: Customer service manager, verbal cues Education comprehension: verbalized understanding, returned demonstration    HOME EXERCISE PROGRAM: No changes this session   PT Short Term Goals -       PT SHORT TERM GOAL #1   Title Patient will be independent in home exercise program to improve strength/mobility for better functional independence with ADLs.    Baseline 8/9: HEP to be given next session, 10/31: patient reports compliance with HEP and would like progressions added next session. 08/22/2021=Patient verbalized that he is walking and doing his LE exercises with no questions at this time.    Time 4    Period Weeks    Status Achieved    Target Date 08/16/21              PT Long Term Goals       PT LONG TERM GOAL #1   Title Patient will increase FOTO score to equal to or greater than 70%  to demonstrate statistically significant improvement in mobility and quality of life.    Baseline 8/9: 64%; risk adjusted 34%; 03/21/21 FOTO: 45; 04/24/21 FOTO: 49% ; 08/22/2021= 56% 4/20: 60% 11/28/21: 63%; 01/04/2022= 60%   Time 12    Period Weeks    Status On-going    Target Date 03/29/2022     PT LONG TERM GOAL #2   Title Patient will increase Berg Balance score by > 6 points to demonstrate decreased fall risk during functional  activities.    Baseline 4/20: 43/56 6/6: 48/56; 713/2023= 50/56   Time 12    Period Weeks    Status Achieved   Target Date 03/29/2022     PT LONG TERM GOAL #3   Title Patient will increase 10 meter walk test to >1.61ms as to improve gait speed for better community ambulation and to reduce fall risk.    Baseline 8/9: 0.65 m/s with rollator; 10/31: 0.68 m/s with rollator. 07/06/2021= 0.71 m/s using rollator; 08/22/2021= 0.94 m/s using rollator. 4/20: 1.1 m/s with rollator  Time 12    Period Weeks    Status Achieved    Target Date 10/11/21      PT LONG TERM GOAL #4   Title Patient will increase six minute walk test distance to >1000 for progression to community ambulator and improve gait ability    Baseline 8/9: 505 ft with rollator; 03/21/21: 763f c rollator, 04/24/21: 7525fc rollator; 07/06/2021=900 feet; 08/22/2021= 935 feet with use of Rollator 44/20: 940 ft 11/28/21: 1055 feet with rollator    Time 12    Period Weeks    Status Achieved    Target Date 01/04/22      PT LONG TERM GOAL #5   Title Patient will increase glute medius strength on Left LE from 3-/5 to 4/5 to improve stability, gait mechanics, and functional strength.    Baseline 10/31: 3-/5; 07/06/2021= 3-/5; 08/22/2021=3-/5 unable to achieve full ROM yet able to withstand some resistance with testing 4/20: unable to obtainfull ROM against gravity 6/6: unable to move against gravity; 01/04/2022= Continues to have difficulty - unable to raise left LE in sidelye against gravity but able to perform supine left Hip abd with some resistance= 3-/5   Time 12    Period Weeks    Status On-going    Target Date 03/29/2022     PT LONG TERM GOAL #6   Title Patient will increase Left single leg stance time to 15 seconds or greater to increase safety in shower and independence with ADLs.    Baseline 10/31; 1.83 sec without UE support; 08/22/2021=2-3  sec  on left; 6 sec on right 4/20: RLE 10 seconds LLE unable to perform 6/6: R LE 4 seconds  during BERG; 01/04/2022= left = 3 sec and right = 8 sec; 02/13/2022= 20 sec on right at best and 4 sec on left LE at best.   Time 12    Period Weeks    Status On-going    Target Date 03/29/2022     PT LONG TERM GOAL #7   Title Patient will safely negotiate 13 steps with handrails to safely enter/exit mothers house.    Baseline 4/20: very challenging 6/6: able to copmlete safely even managing dogs per pt report; 01/04/2022- Patient performed 16 steps with B Rail-no difficulty other than fatigue.    Time 12    Period Weeks    Status Achieved   Target Date 01/04/22              Plan - 12/13/21 1340     Clinical Impression Statement Continued with POC- focusing on LE strengthening - able to increase resistance with LLE and focus on muscle control. He was able to walk with resistance some and increase overall distance today. Pt conitnues to make progress toward goals of treatment overall.    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    Rehab Potential Fair    PT Frequency 2x / week    PT Duration 12 weeks    PT Treatment/Interventions ADLs/Self Care Home Management;Aquatic Therapy;Canalith Repostioning;Biofeedback;Cryotherapy;Ultrasound;Traction;Moist Heat;Electrical Stimulation;DME Instruction;Gait training;Therapeutic exercise;Therapeutic activities;Functional mobility training;Stair training;Balance  training;Neuromuscular re-education;Patient/family education;Manual techniques;Orthotic Fit/Training;Compression bandaging;Passive range of motion;Vestibular;Taping;Splinting;Energy conservation;Dry needling;Visual/perceptual remediation/compensation    PT Next Visit Plan Continue  with progessive balance training, Progressive LE strenthening as appropriate.    PT Home Exercise Plan Access Code: [email or web address] URL: [email or web address]  Added [place] with BTB and encouraged more walking around the home during commerical breaks when watching TV.    Consulted and Agree with Plan of Care Patient              Lewis Moccasin PT 2:04 PM, 03/01/22 Physical Therapist - [place] [phone]     12/13/2021, 1:41 PM

## 2022-03-01 NOTE — Therapy (Signed)
[place] MAIN [facility] SERVICES [address] [place], [place], [address] Phone: [phone]   Fax:  [phone]  Occupational Therapy Treatment  Patient Details  Name: Dustin Gray MRN: [id number] Date of Birth: 11-25-1955 Referring Provider (OT): Staci Acosta, MD   Encounter Date: 03/01/2022   OT End of Session - 03/01/22 1549     Visit Number 18    Date for OT Re-Evaluation 05/30/22    OT Start Time 0250    OT Stop Time 0350    OT Time Calculation (min) 60 min    Activity Tolerance Patient tolerated treatment well;No increased pain    Behavior During Therapy WFL for tasks assessed/performed             Past Medical History:  Diagnosis Date   Abscess    groin   Arthritis    lower spine   Erectile dysfunction    Low testosterone    Lumbar herniated disc    L5   Multiple sclerosis ([place])    Staph aureus infection     Past Surgical History:  Procedure Laterality Date   CARDIOVERSION N/A 11/22/2021   Procedure: CARDIOVERSION;  Surgeon: [name], MD;  Location: [facility];  Service: Cardiovascular;  Laterality: N/A;   COLONOSCOPY WITH PROPOFOL N/A 07/04/2016   Procedure: COLONOSCOPY WITH PROPOFOL;  Surgeon: [name], MD;  Location: [place];  Service: Endoscopy;  Laterality: N/A;   [place]   POLYPECTOMY  07/04/2016   Procedure: POLYPECTOMY;  Surgeon: [name], MD;  Location: [place];  Service: Endoscopy;;   TONSILLECTOMY      There were no vitals filed for this visit.   Subjective Assessment - 03/01/22 1551     Subjective  Dustin Gray returns to OT for follow up support on lymphedema self management. Pt denies LE related leg pain. Pt  presents with custom compression stockings in place bilaterally. He states he needs new compression socks because, "the right one has a big hole in it".    Pertinent History relevant to LE: HTN, MS, Herniated C-5-C6,  L5-S1, chronic low back pain, persistent Afib,Hx LLE cellulitis, Graves disease, HYPERthyroidism, OA    Limitations difficulty walking, impaired functional mobility and transfers, impaired balance, muscle weakness, L>R,, altered sensation, chronic back pain , chronic leg swelling and associated pain, spasticity in legs    Repetition Increases Symptoms    Special Tests +Stemmer sign, L>R; Intake FOTO: 51/100    Patient Stated Goals Learn about lymphedema and explore treatment options    Currently in Pain? No/denies    Pain Onset Yesterday    Pain Onset More than a month ago                 LYMPHEDEMA/ONCOLOGY QUESTIONNAIRE - 03/01/22 1554       Lymphedema Assessments   Lymphedema Assessments Lower extremities      Right Lower Extremity Lymphedema   Other RLE (dominant)  limb volume from base of toes to tibial tuberosity (A-D) = 4061.4 ml.    Other REA-D volume increased by 0.2% since last measured on 11/02/21. Excellent management over time.      Left Lower Extremity Lymphedema   Other LLE limb volume from base of toes to tibial tuberosity (A-D) = 3996.7 ml.    Other LLEA-D volume decreased by 2.4% since last measured on 11/02/21. Excellent management over time.  OT Treatments/Exercises (OP) - 03/01/22 1553       ADLs   ADL Education Given Yes      Manual Therapy   Manual Therapy Edema management    Edema Management BLE comparative limb volumetrics; compression garment assessment                    OT Education - 03/01/22 1557     Education Details Pt education on results of follow up limb volumetrics and compression garment assessment. Pt edu for ordering replacement garments from DME provider.    Person(s) Educated Patient    Methods Explanation;Demonstration;Handout    Comprehension Verbalized understanding;Returned demonstration;Need further instruction                 OT Long Term Goals - 11/02/21 1028       OT  LONG TERM GOAL #1   Title Given this patient's Intake score of 51/100 on the functional outcomes FOTO tool, patient will experience an increase in function of 5 points to improve basic and instrumental ADLs performance, including lymphedema self-care.    Baseline Max A    Time 12    Period Weeks    Status Achieved   08/08/21 ( OT visit 9) increased 2 points tfrom 51/100 initially to 53100 today. 11/02/21: Goal met with final score 77/100%, a 26 point increase. Pt is also simultaneously undergoing PT, whoich has helped lymphatic function immensely.   Target Date 01/07/22      OT LONG TERM GOAL #2   Title Pt will be able to apply knee length, multi-layer, short stretch compression wraps to one limb at a time using gradient techniques with MAX CG ASSISTANCE to decrease limb volume, to limit infection risk, and to limit lymphedema progression.    Baseline Dependent    Time 4    Period Days    Status Achieved   Pt met and exceeded this goal as he is able to wrap independently     OT LONG TERM GOAL #3   Title Pt will demonstrate understanding of lymphedema prevention strategies by identifying and discussing 5 precautions using printed reference (modified assistance) to reduce risk of progression and to limit infection risk.    Baseline Max A    Time 4    Period Days    Status Achieved    Target Date --   4th OT Rx visit     OT LONG TERM GOAL #4   Title Pt will achieve at least a 10% limb volume reduction in bilateral legs to return limb to normal size and shape,  to limit lymphedema progression and to limit infection risk.    Baseline mAx    Time 12    Period Weeks    Status Achieved   LLE volume reduced by 18.4%. RLE volume reduced by 11.7%.   Target Date 01/07/22      OT LONG TERM GOAL #5   Title With MAX CG ASSISTANCE Pt will achieve and sustain a least 85% compliance with all 4 LE self-care home program components throughout Intensive Phase CDT, including modified simple self-MLD, daily  skin inspection and care, lymphatic pumping the ex, 23/7 compression wraps to sustain clinical gains made in CDT and to limit lymphedema progression and further functional decline.    Baseline Dependent    Time 12    Period Weeks    Status Achieved   Met and exceeded goal. Pt modified independent (extra time) with all home program components  and consistently 85% compliant w LE self care                  Plan - 03/01/22 1558     Clinical Impression Statement Pt demonstrates excellent LE self-management over time. Skin is in excellent condition and B limb volumes below the knees are virtually unchanged since last measured on 11/02/21. Pt has remained infection free and is 100% compliant with LE self care home program. His custom garments are well worn and in need of replacement. OT emailed DME vendor with Pt's need for exact relpacements as the fit remains very good and Pt is able to don and doff independently. OT requested DME provider to contact Pt directly. Pt will call and schedule when garments arrive so we can assist with fitting.    OT Occupational Profile and History Comprehensive Assessment- Review of records and extensive additional review of physical, cognitive, psychosocial history related to current functional performance    Occupational performance deficits (Please refer to evaluation for details): ADL's;IADL's;Work;Leisure;Social Participation;Other   role performance   Body Structure / Function / Physical Skills ADL;Edema;Skin integrity;Flexibility;Pain;ROM;Decreased knowledge of use of DME;Scar mobility;IADL    Rehab Potential Good    Clinical Decision Making Several treatment options, min-mod task modification necessary    Comorbidities Affecting Occupational Performance: Presence of comorbidities impacting occupational performance    Modification or Assistance to Complete Evaluation  Min-Moderate modification of tasks or assist with assess necessary to complete eval    OT  Frequency 1x / week    OT Duration 2 weeks, and PRN for custom compression replacement garment fitting and q 3-6 months for follow along support   OT Treatment/Interventions Self-care/ADL training;Therapeutic exercise;Manual Therapy;Coping strategies training;Therapeutic activities;Manual lymph drainage;DME and/or AE instruction;Compression bandaging;Other (comment);Patient/family education   skin care to limit infection risk   Plan Complete Decongestive Therapy (CDT) One leg at a time to limit fall LLE first. Manual lymphatic drainage (MLD), skin care, ther ex, compression wraps, then    OT Home Exercise Plan Pt verbalized understanding that he requires assistance with all lymphedema home care components, especially compression wrapping, between visits for optimal prognosis. Without assistance prognosis is poor    Recommended Other Services In insurance benefits available, garmentsConsider advanced sequential pneumatic compression device (Flexitouch) to maximize independence w lymphedema self-care at home over time. Pt unable to perform simple self-mld    Consulted and Agree with Plan of Care Patient             Patient will benefit from skilled therapeutic intervention in order to improve the following deficits and impairments:   Body Structure / Function / Physical Skills: ADL, Edema, Skin integrity, Flexibility, Pain, ROM, Decreased knowledge of use of DME, Scar mobility, IADL       Visit Diagnosis: Lymphedema, not elsewhere classified    Problem List Patient Active Problem List   Diagnosis Date Noted   Persistent atrial fibrillation ([facility])    Graves disease    Abdominal bloating    Atrial fibrillation with RVR ([place]) 10/01/2019   Hyperthyroidism    Atrial fibrillation with rapid ventricular response ([place]) 09/30/2019   Leg edema    Left leg cellulitis    Special screening for malignant neoplasms, colon    Polyp of sigmoid colon    Benign neoplasm of ascending colon    Rectal  polyp    Herniated nucleus pulposus, L5-S1 11/09/2015   Low back pain 10/25/2015   Herniated nucleus pulposus, C5-6 right 10/06/2015   Dysesthesia 09/16/2015  Spasticity 09/16/2015   Unsteady gait 09/16/2015   Multiple sclerosis ([place]) 06/08/2011    Andrey Spearman, MS, OTR/L, [facility] 03/01/22 4:08 PM   [place] MAIN [facility] SERVICES [address] [place], [place], [address] Phone: [phone]   Fax:  [phone]  Name: Dustin Gray MRN: [id number] Date of Birth: [birth year]

## 2022-03-06 ENCOUNTER — Ambulatory Visit: Payer: Medicare PPO

## 2022-03-06 DIAGNOSIS — R278 Other lack of coordination: Secondary | ICD-10-CM

## 2022-03-06 DIAGNOSIS — R2689 Other abnormalities of gait and mobility: Secondary | ICD-10-CM | POA: Diagnosis not present

## 2022-03-06 DIAGNOSIS — R2681 Unsteadiness on feet: Secondary | ICD-10-CM | POA: Diagnosis not present

## 2022-03-06 DIAGNOSIS — R269 Unspecified abnormalities of gait and mobility: Secondary | ICD-10-CM

## 2022-03-06 DIAGNOSIS — R262 Difficulty in walking, not elsewhere classified: Secondary | ICD-10-CM

## 2022-03-06 DIAGNOSIS — M6281 Muscle weakness (generalized): Secondary | ICD-10-CM

## 2022-03-06 DIAGNOSIS — I890 Lymphedema, not elsewhere classified: Secondary | ICD-10-CM | POA: Diagnosis not present

## 2022-03-06 NOTE — Therapy (Signed)
OUTPATIENT PHYSICAL THERAPY TREATMENT NOTE  Patient Name: Dustin Gray MRN: [id number] DOB:10-29-1955, 66 y.o., male 69 Date: 03/07/2022  PCP: [name], MD REFERRING PROVIDER: [name], MD   PT End of Session - 03/06/22 1305     Visit Number 1    Number of Visits 70    Date for PT Re-Evaluation 04/24/22    Authorization Type [employer] Auth 11/28/21-02/22/22 for 24 visits    Authorization Time Period Cert for 4/33/29-51/8/84    Progress Note Due on Visit 80    PT Start Time 1515    PT Stop Time 1559    PT Time Calculation (min) 44 min    Equipment Utilized During Treatment Gait belt    Activity Tolerance Patient tolerated treatment well;No increased pain;Patient limited by fatigue    Behavior During Therapy [facility] for tasks assessed/performed                  Past Medical History:  Diagnosis Date   Abscess    groin   Arthritis    lower spine   Erectile dysfunction    Low testosterone    Lumbar herniated disc    L5   Multiple sclerosis ([place])    Staph aureus infection    Past Surgical History:  Procedure Laterality Date   CARDIOVERSION N/A 11/22/2021   Procedure: CARDIOVERSION;  Surgeon: [name], MD;  Location: [facility];  Service: Cardiovascular;  Laterality: N/A;   COLONOSCOPY WITH PROPOFOL N/A 07/04/2016   Procedure: COLONOSCOPY WITH PROPOFOL;  Surgeon: [name], MD;  Location: [place];  Service: Endoscopy;  Laterality: N/A;   [place]   POLYPECTOMY  07/04/2016   Procedure: POLYPECTOMY;  Surgeon: [name], MD;  Location: [place];  Service: Endoscopy;;   TONSILLECTOMY     Patient Active Problem List   Diagnosis Date Noted   Persistent atrial fibrillation ([place])    Graves disease    Abdominal bloating    Atrial fibrillation with RVR ([place]) 10/01/2019   Hyperthyroidism    Atrial fibrillation with rapid ventricular response ([place]) 09/30/2019   Leg edema     Left leg cellulitis    Special screening for malignant neoplasms, colon    Polyp of sigmoid colon    Benign neoplasm of ascending colon    Rectal polyp    Herniated nucleus pulposus, L5-S1 11/09/2015   Low back pain 10/25/2015   Herniated nucleus pulposus, C5-6 right 10/06/2015   Dysesthesia 09/16/2015   Spasticity 09/16/2015   Unsteady gait 09/16/2015   Multiple sclerosis ([place]) 06/08/2011     REFERRING DIAG: R26.81 (ICD-10-CM) - Unsteadiness on feet   THERAPY DIAG:  Unsteadiness on feet  Difficulty in walking, not elsewhere classified  Abnormality of gait and mobility  Muscle weakness (generalized)  Rationale for Evaluation and Treatment Rehabilitation  PERTINENT HISTORY: Patient presents to physical therapy for unsteadiness, walking instability, fatigue. His PMH includes paroxysmal a fib, MS, lymphedema, hypothyroidism, Graves Disease, arthritis, lumbar herniated disc (L5), depression and neuropathy. Patient first developed symptoms of MS in 2005 and was diagnosed in 2006. Patient has done PT in the past, but hasn't been seen since 2020 at [facility]. Has not been doing his exercises in the past year. Walk outside to garage to ride lawnmower, walks in house. Retired Automotive engineer. Has a rollator at home but doesn't use it in the house   PRECAUTIONS: Fall  SUBJECTIVE: Pt reports doing well overall -  no residual soreness after last visit. Denies any pain or falls.    PAIN:  Are you having pain? No    TODAY'S TREATMENT:   THEREX:   Well zone exercises:   Knee ext- Level 2 2 sets of 12 each LE  Knee flex- Level 1 - 2 sets of 12 reps each LE   Leg press: Level 2 (each LE- 1 leg at a time) 2 sets of 10 reps  Calf press: Level 2 ( Each LE- 1 leg at a time) 2 sets of 10 reps  Standing hip circles around yoga block x 12 reps each LE Standing squats with 5 sec hold at bottom  x 12 reps with BUE Support   Education provided throughout session via VC/TC and  demonstration to facilitate movement at target joints and correct muscle activation for all testing and exercises performed.   PATIENT EDUCATION: Education details: exercise technique, body mechanics Person educated: Patient Education method: Customer service manager, verbal cues Education comprehension: verbalized understanding, returned demonstration    HOME EXERCISE PROGRAM: No changes this session   PT Short Term Goals -       PT SHORT TERM GOAL #1   Title Patient will be independent in home exercise program to improve strength/mobility for better functional independence with ADLs.    Baseline 8/9: HEP to be given next session, 10/31: patient reports compliance with HEP and would like progressions added next session. 08/22/2021=Patient verbalized that he is walking and doing his LE exercises with no questions at this time.    Time 4    Period Weeks    Status Achieved    Target Date 08/16/21              PT Long Term Goals       PT LONG TERM GOAL #1   Title Patient will increase FOTO score to equal to or greater than 70%  to demonstrate statistically significant improvement in mobility and quality of life.    Baseline 8/9: 64%; risk adjusted 34%; 03/21/21 FOTO: 45; 04/24/21 FOTO: 49% ; 08/22/2021= 56% 4/20: 60% 11/28/21: 63%; 01/04/2022= 60%   Time 12    Period Weeks    Status On-going    Target Date 03/29/2022     PT LONG TERM GOAL #2   Title Patient will increase Berg Balance score by > 6 points to demonstrate decreased fall risk during functional activities.    Baseline 4/20: 43/56 6/6: 48/56; 713/2023= 50/56   Time 12    Period Weeks    Status Achieved   Target Date 03/29/2022     PT LONG TERM GOAL #3   Title Patient will increase 10 meter walk test to >1.47ms as to improve gait speed for better community ambulation and to reduce fall risk.    Baseline 8/9: 0.65 m/s with rollator; 10/31: 0.68 m/s with rollator. 07/06/2021= 0.71 m/s using rollator; 08/22/2021=  0.94 m/s using rollator. 4/20: 1.1 m/s with rollator    Time 12    Period Weeks    Status Achieved    Target Date 10/11/21      PT LONG TERM GOAL #4   Title Patient will increase six minute walk test distance to >1000 for progression to community ambulator and improve gait ability    Baseline 8/9: 505 ft with rollator; 03/21/21: 7760fc rollator, 04/24/21: 75550f rollator; 07/06/2021=900 feet; 08/22/2021= 935 feet with use of Rollator 44/20: 940 ft 11/28/21: 1055 feet with rollator    Time 12  Period Weeks    Status Achieved    Target Date 01/04/22      PT LONG TERM GOAL #5   Title Patient will increase glute medius strength on Left LE from 3-/5 to 4/5 to improve stability, gait mechanics, and functional strength.    Baseline 10/31: 3-/5; 07/06/2021= 3-/5; 08/22/2021=3-/5 unable to achieve full ROM yet able to withstand some resistance with testing 4/20: unable to obtainfull ROM against gravity 6/6: unable to move against gravity; 01/04/2022= Continues to have difficulty - unable to raise left LE in sidelye against gravity but able to perform supine left Hip abd with some resistance= 3-/5   Time 12    Period Weeks    Status On-going    Target Date 03/29/2022     PT LONG TERM GOAL #6   Title Patient will increase Left single leg stance time to 15 seconds or greater to increase safety in shower and independence with ADLs.    Baseline 10/31; 1.83 sec without UE support; 08/22/2021=2-3  sec  on left; 6 sec on right 4/20: RLE 10 seconds LLE unable to perform 6/6: R LE 4 seconds during BERG; 01/04/2022= left = 3 sec and right = 8 sec; 02/13/2022= 20 sec on right at best and 4 sec on left LE at best.   Time 12    Period Weeks    Status On-going    Target Date 03/29/2022     PT LONG TERM GOAL #7   Title Patient will safely negotiate 13 steps with handrails to safely enter/exit mothers house.    Baseline 4/20: very challenging 6/6: able to copmlete safely even managing dogs per pt report; 01/04/2022-  Patient performed 16 steps with B Rail-no difficulty other than fatigue.    Time 12    Period Weeks    Status Achieved   Target Date 01/04/22              Plan - 12/13/21 1340     Clinical Impression Statement Continued with POC- patient reviewed gym based exercises and able to progress as well as more controlled with less VC for technique and less overall physical assistance. Patient exhibited fatigue as limiting factor but was able to slightly progress.  Pt conitnues to make progress toward goals of treatment overall and will continue to benefit from skilled PT interventions to improve his overall strength, mobility and quality of life.    Personal Factors and Comorbidities Age;Comorbidity 3+;Fitness;Past/Current Experience;Time since onset of injury/illness/exacerbation    Comorbidities aroxysmal a fib, MS, lymphedema, hypothyroidism, Graves Disease, arthritis, lumbar herniated disc (L5), depression and neuropathy    Examination-Activity Limitations Bed Mobility;Bend;Caring for Others;Carry;Reach Overhead;Locomotion Level;Lift;Hygiene/Grooming;Dressing;Squat;Stairs;Stand;Toileting;Transfers    Examination-Participation Restrictions Cleaning;Community Activity;Laundry;Occupation;Shop;Volunteer;Yard Work    Merchant [employer] officer Evolving/Moderate complexity    Rehab Potential Fair    PT Frequency 2x / week    PT Duration 12 weeks    PT Treatment/Interventions ADLs/Self Care Home Management;Aquatic Therapy;Canalith Repostioning;Biofeedback;Cryotherapy;Ultrasound;Traction;Moist Heat;Electrical Stimulation;DME Instruction;Gait training;Therapeutic exercise;Therapeutic activities;Functional mobility training;Stair training;Balance training;Neuromuscular re-education;Patient/family education;Manual techniques;Orthotic Fit/Training;Compression bandaging;Passive range of motion;Vestibular;Taping;Splinting;Energy conservation;Dry needling;Visual/perceptual remediation/compensation    PT  Next Visit Plan Continue with progessive balance training, Progressive LE strenthening as appropriate.    PT Home Exercise Plan Access Code: [email or web address] URL: [email or web address]  Added Bridges with BTB and encouraged more walking around the home during commerical breaks when watching TV.    Consulted and Agree with Plan of Care Patient              Kathlee Nations  Leda Bellefeuille PT 1:06 PM, 03/07/22 Physical Therapist - [place] [phone]     12/13/2021, 1:41 PM

## 2022-03-08 ENCOUNTER — Ambulatory Visit: Payer: Medicare PPO

## 2022-03-08 DIAGNOSIS — R262 Difficulty in walking, not elsewhere classified: Secondary | ICD-10-CM | POA: Diagnosis not present

## 2022-03-08 DIAGNOSIS — M6281 Muscle weakness (generalized): Secondary | ICD-10-CM

## 2022-03-08 DIAGNOSIS — R2681 Unsteadiness on feet: Secondary | ICD-10-CM | POA: Diagnosis not present

## 2022-03-08 DIAGNOSIS — R2689 Other abnormalities of gait and mobility: Secondary | ICD-10-CM | POA: Diagnosis not present

## 2022-03-08 DIAGNOSIS — I890 Lymphedema, not elsewhere classified: Secondary | ICD-10-CM | POA: Diagnosis not present

## 2022-03-08 DIAGNOSIS — R269 Unspecified abnormalities of gait and mobility: Secondary | ICD-10-CM | POA: Diagnosis not present

## 2022-03-08 DIAGNOSIS — R278 Other lack of coordination: Secondary | ICD-10-CM | POA: Diagnosis not present

## 2022-03-08 NOTE — Therapy (Signed)
OUTPATIENT PHYSICAL THERAPY TREATMENT NOTE  Patient Name: Dustin Gray MRN: [id number] DOB:1955/12/26, 66 y.o., male 1 Date: 03/08/2022  PCP: [name], MD REFERRING PROVIDER: [name], MD   PT End of Session - 03/08/22 1354     Visit Number 32    Number of Visits 90    Date for PT Re-Evaluation 04/24/22    Authorization Type [employer] Auth 11/28/21-02/22/22 for 24 visits    Authorization Time Period Cert for 8/67/67-20/9/47    Progress Note Due on Visit 80    PT Start Time 1346    PT Stop Time 1431    PT Time Calculation (min) 45 min    Equipment Utilized During Treatment Gait belt    Activity Tolerance Patient tolerated treatment well;No increased pain;Patient limited by fatigue    Behavior During Therapy [facility] for tasks assessed/performed                  Past Medical History:  Diagnosis Date   Abscess    groin   Arthritis    lower spine   Erectile dysfunction    Low testosterone    Lumbar herniated disc    L5   Multiple sclerosis ([place])    Staph aureus infection    Past Surgical History:  Procedure Laterality Date   CARDIOVERSION N/A 11/22/2021   Procedure: CARDIOVERSION;  Surgeon: [name], MD;  Location: [facility];  Service: Cardiovascular;  Laterality: N/A;   COLONOSCOPY WITH PROPOFOL N/A 07/04/2016   Procedure: COLONOSCOPY WITH PROPOFOL;  Surgeon: [name], MD;  Location: [place];  Service: Endoscopy;  Laterality: N/A;   [place]   POLYPECTOMY  07/04/2016   Procedure: POLYPECTOMY;  Surgeon: [name], MD;  Location: [place];  Service: Endoscopy;;   TONSILLECTOMY     Patient Active Problem List   Diagnosis Date Noted   Persistent atrial fibrillation ([place])    Graves disease    Abdominal bloating    Atrial fibrillation with RVR ([place]) 10/01/2019   Hyperthyroidism    Atrial fibrillation with rapid ventricular response ([place]) 09/30/2019   Leg edema     Left leg cellulitis    Special screening for malignant neoplasms, colon    Polyp of sigmoid colon    Benign neoplasm of ascending colon    Rectal polyp    Herniated nucleus pulposus, L5-S1 11/09/2015   Low back pain 10/25/2015   Herniated nucleus pulposus, C5-6 right 10/06/2015   Dysesthesia 09/16/2015   Spasticity 09/16/2015   Unsteady gait 09/16/2015   Multiple sclerosis ([place]) 06/08/2011     REFERRING DIAG: R26.81 (ICD-10-CM) - Unsteadiness on feet   THERAPY DIAG:  Unsteadiness on feet  Difficulty in walking, not elsewhere classified  Abnormality of gait and mobility  Muscle weakness (generalized)  Rationale for Evaluation and Treatment Rehabilitation  PERTINENT HISTORY: Patient presents to physical therapy for unsteadiness, walking instability, fatigue. His PMH includes paroxysmal a fib, MS, lymphedema, hypothyroidism, Graves Disease, arthritis, lumbar herniated disc (L5), depression and neuropathy. Patient first developed symptoms of MS in 2005 and was diagnosed in 2006. Patient has done PT in the past, but hasn't been seen since 2020 at [facility]. Has not been doing his exercises in the past year. Walk outside to garage to ride lawnmower, walks in house. Retired Automotive engineer. Has a rollator at home but doesn't use it in the house   PRECAUTIONS: Fall  SUBJECTIVE: Pt reports feeling okay- mild sore  back but better today.    PAIN:  Are you having pain? No    TODAY'S TREATMENT:   THEREX:   Well zone exercises: Pyramid LE Strengthening  Knee ext-  Level 1 x  12 reps each LE  Level 2 x 10 reps each LE Level 3 x 8 reps Right LE; level 2 x 8 reps with left LE Level 4 x 6 reps Right LE; Level 2 x 6 reps with left LE     Knee flex:  Level 1 Left LE x 12; Level 2 x 12 with right LE Level 2 Left LE x 8 reps; Level 3 x 10 reps with right LE Level  2 Left LE x 8 reps; Level 4 x 8 reps with right LE Level 2 left LE x 6; Level 5 x 6 reps with right LE  Leg  press:  Level 2 right LE x 12 reps; Left LE - level 1 x 12 reps Level 3 right LE x 10 reps; Left LE - level 2 - 10 reps Level 4 right LE x 8 reps ; Left LE - Level 3 - 8 reps Level 5 right LE x 8 reps; Left LE- Level 4 - 8 reps    Calf press:  Level 2 each LE x 12 reps each Level 3 each LE x 10 reps each Level 4 each LE x 8 reps each Level 5 each LE x 6 reps each   Education provided throughout session via VC/TC and demonstration to facilitate movement at target joints and correct muscle activation for all testing and exercises performed.   PATIENT EDUCATION: Education details: exercise technique, body mechanics Person educated: Patient Education method: Customer service manager, verbal cues Education comprehension: verbalized understanding, returned demonstration    HOME EXERCISE PROGRAM: No changes this session   PT Short Term Goals -       PT SHORT TERM GOAL #1   Title Patient will be independent in home exercise program to improve strength/mobility for better functional independence with ADLs.    Baseline 8/9: HEP to be given next session, 10/31: patient reports compliance with HEP and would like progressions added next session. 08/22/2021=Patient verbalized that he is walking and doing his LE exercises with no questions at this time.    Time 4    Period Weeks    Status Achieved    Target Date 08/16/21              PT Long Term Goals       PT LONG TERM GOAL #1   Title Patient will increase FOTO score to equal to or greater than 70%  to demonstrate statistically significant improvement in mobility and quality of life.    Baseline 8/9: 64%; risk adjusted 34%; 03/21/21 FOTO: 45; 04/24/21 FOTO: 49% ; 08/22/2021= 56% 4/20: 60% 11/28/21: 63%; 01/04/2022= 60%   Time 12    Period Weeks    Status On-going    Target Date 03/29/2022     PT LONG TERM GOAL #2   Title Patient will increase Berg Balance score by > 6 points to demonstrate decreased fall risk during  functional activities.    Baseline 4/20: 43/56 6/6: 48/56; 713/2023= 50/56   Time 12    Period Weeks    Status Achieved   Target Date 03/29/2022     PT LONG TERM GOAL #3   Title Patient will increase 10 meter walk test to >1.42ms as to improve gait speed for better community ambulation and  to reduce fall risk.    Baseline 8/9: 0.65 m/s with rollator; 10/31: 0.68 m/s with rollator. 07/06/2021= 0.71 m/s using rollator; 08/22/2021= 0.94 m/s using rollator. 4/20: 1.1 m/s with rollator    Time 12    Period Weeks    Status Achieved    Target Date 10/11/21      PT LONG TERM GOAL #4   Title Patient will increase six minute walk test distance to >1000 for progression to community ambulator and improve gait ability    Baseline 8/9: 505 ft with rollator; 03/21/21: 756f c rollator, 04/24/21: 7520fc rollator; 07/06/2021=900 feet; 08/22/2021= 935 feet with use of Rollator 44/20: 940 ft 11/28/21: 1055 feet with rollator    Time 12    Period Weeks    Status Achieved    Target Date 01/04/22      PT LONG TERM GOAL #5   Title Patient will increase glute medius strength on Left LE from 3-/5 to 4/5 to improve stability, gait mechanics, and functional strength.    Baseline 10/31: 3-/5; 07/06/2021= 3-/5; 08/22/2021=3-/5 unable to achieve full ROM yet able to withstand some resistance with testing 4/20: unable to obtainfull ROM against gravity 6/6: unable to move against gravity; 01/04/2022= Continues to have difficulty - unable to raise left LE in sidelye against gravity but able to perform supine left Hip abd with some resistance= 3-/5   Time 12    Period Weeks    Status On-going    Target Date 03/29/2022     PT LONG TERM GOAL #6   Title Patient will increase Left single leg stance time to 15 seconds or greater to increase safety in shower and independence with ADLs.    Baseline 10/31; 1.83 sec without UE support; 08/22/2021=2-3  sec  on left; 6 sec on right 4/20: RLE 10 seconds LLE unable to perform 6/6: R LE 4  seconds during BERG; 01/04/2022= left = 3 sec and right = 8 sec; 02/13/2022= 20 sec on right at best and 4 sec on left LE at best.   Time 12    Period Weeks    Status On-going    Target Date 03/29/2022     PT LONG TERM GOAL #7   Title Patient will safely negotiate 13 steps with handrails to safely enter/exit mothers house.    Baseline 4/20: very challenging 6/6: able to copmlete safely even managing dogs per pt report; 01/04/2022- Patient performed 16 steps with B Rail-no difficulty other than fatigue.    Time 12    Period Weeks    Status Achieved   Target Date 01/04/22              Plan - 12/13/21 1340     Clinical Impression Statement Continued with POC- Patient able to participate in LE strengthening today- pyramid using gym equipment. Patient was able to progress in LE strength and endurance overall today- Good form with less overall VC for technique. Some assist to position Left LE on and off equipment initially yet patient improves with practice.  Pt conitnues to make progress toward goals of treatment overall and will continue to benefit from skilled PT interventions to improve his overall strength, mobility and quality of life.    Personal Factors and Comorbidities Age;Comorbidity 3+;Fitness;Past/Current Experience;Time since onset of injury/illness/exacerbation    Comorbidities aroxysmal a fib, MS, lymphedema, hypothyroidism, Graves Disease, arthritis, lumbar herniated disc (L5), depression and neuropathy    Examination-Activity Limitations Bed Mobility;Bend;Caring for Others;Carry;Reach Overhead;Locomotion Level;Lift;Hygiene/Grooming;Dressing;Squat;Stairs;Stand;Toileting;Transfers  Examination-Participation Restrictions Cleaning;Community Activity;Laundry;Occupation;Shop;Volunteer;Yard Work    Merchant [employer] officer Evolving/Moderate complexity    Rehab Potential Fair    PT Frequency 2x / week    PT Duration 12 weeks    PT Treatment/Interventions ADLs/Self Care  Home Management;Aquatic Therapy;Canalith Repostioning;Biofeedback;Cryotherapy;Ultrasound;Traction;Moist Heat;Electrical Stimulation;DME Instruction;Gait training;Therapeutic exercise;Therapeutic activities;Functional mobility training;Stair training;Balance training;Neuromuscular re-education;Patient/family education;Manual techniques;Orthotic Fit/Training;Compression bandaging;Passive range of motion;Vestibular;Taping;Splinting;Energy conservation;Dry needling;Visual/perceptual remediation/compensation    PT Next Visit Plan Continue with progessive balance training, Progressive LE strenthening as appropriate.    PT Home Exercise Plan Access Code: [email or web address] URL: [email or web address]  Added Bridges with BTB and encouraged more walking around the home during commerical breaks when watching TV.    Consulted and Agree with Plan of Care Patient              Lewis Moccasin PT 2:51 PM, 03/08/22 Physical Therapist - [place] [phone]     12/13/2021, 1:41 PM

## 2022-03-13 ENCOUNTER — Ambulatory Visit: Payer: Medicare PPO

## 2022-03-13 DIAGNOSIS — R2689 Other abnormalities of gait and mobility: Secondary | ICD-10-CM | POA: Diagnosis not present

## 2022-03-13 DIAGNOSIS — R269 Unspecified abnormalities of gait and mobility: Secondary | ICD-10-CM | POA: Diagnosis not present

## 2022-03-13 DIAGNOSIS — R2681 Unsteadiness on feet: Secondary | ICD-10-CM

## 2022-03-13 DIAGNOSIS — M6281 Muscle weakness (generalized): Secondary | ICD-10-CM | POA: Diagnosis not present

## 2022-03-13 DIAGNOSIS — R278 Other lack of coordination: Secondary | ICD-10-CM | POA: Diagnosis not present

## 2022-03-13 DIAGNOSIS — R262 Difficulty in walking, not elsewhere classified: Secondary | ICD-10-CM | POA: Diagnosis not present

## 2022-03-13 DIAGNOSIS — I890 Lymphedema, not elsewhere classified: Secondary | ICD-10-CM | POA: Diagnosis not present

## 2022-03-13 NOTE — Therapy (Signed)
OUTPATIENT PHYSICAL THERAPY TREATMENT NOTE  Patient Name: Dustin Gray MRN: [id number] DOB:Jan 22, 1956, 66 y.o., male 24 Date: 03/14/2022  PCP: [name], MD REFERRING PROVIDER: [name], MD   PT End of Session - 03/13/22 1354     Visit Number 77    Number of Visits 90    Date for PT Re-Evaluation 04/24/22    Authorization Type [employer] Auth 11/28/21-02/22/22 for 24 visits    Authorization Time Period Cert for 1/49/70-26/3/78    Progress Note Due on Visit 80    PT Start Time 1346    PT Stop Time 1428    PT Time Calculation (min) 42 min    Equipment Utilized During Treatment Gait belt    Activity Tolerance Patient tolerated treatment well;No increased pain;Patient limited by fatigue    Behavior During Therapy [facility] for tasks assessed/performed                   Past Medical History:  Diagnosis Date   Abscess    groin   Arthritis    lower spine   Erectile dysfunction    Low testosterone    Lumbar herniated disc    L5   Multiple sclerosis ([place])    Staph aureus infection    Past Surgical History:  Procedure Laterality Date   CARDIOVERSION N/A 11/22/2021   Procedure: CARDIOVERSION;  Surgeon: [name], MD;  Location: [facility];  Service: Cardiovascular;  Laterality: N/A;   COLONOSCOPY WITH PROPOFOL N/A 07/04/2016   Procedure: COLONOSCOPY WITH PROPOFOL;  Surgeon: [name], MD;  Location: [place];  Service: Endoscopy;  Laterality: N/A;   [place]   POLYPECTOMY  07/04/2016   Procedure: POLYPECTOMY;  Surgeon: [name], MD;  Location: [place];  Service: Endoscopy;;   TONSILLECTOMY     Patient Active Problem List   Diagnosis Date Noted   Persistent atrial fibrillation ([place])    Graves disease    Abdominal bloating    Atrial fibrillation with RVR ([place]) 10/01/2019   Hyperthyroidism    Atrial fibrillation with rapid ventricular response ([place]) 09/30/2019   Leg edema     Left leg cellulitis    Special screening for malignant neoplasms, colon    Polyp of sigmoid colon    Benign neoplasm of ascending colon    Rectal polyp    Herniated nucleus pulposus, L5-S1 11/09/2015   Low back pain 10/25/2015   Herniated nucleus pulposus, C5-6 right 10/06/2015   Dysesthesia 09/16/2015   Spasticity 09/16/2015   Unsteady gait 09/16/2015   Multiple sclerosis ([place]) 06/08/2011     REFERRING DIAG: R26.81 (ICD-10-CM) - Unsteadiness on feet   THERAPY DIAG:  Unsteadiness on feet  Difficulty in walking, not elsewhere classified  Abnormality of gait and mobility  Muscle weakness (generalized)  Rationale for Evaluation and Treatment Rehabilitation  PERTINENT HISTORY: Patient presents to physical therapy for unsteadiness, walking instability, fatigue. His PMH includes paroxysmal a fib, MS, lymphedema, hypothyroidism, Graves Disease, arthritis, lumbar herniated disc (L5), depression and neuropathy. Patient first developed symptoms of MS in 2005 and was diagnosed in 2006. Patient has done PT in the past, but hasn't been seen since 2020 at [facility]. Has not been doing his exercises in the past year. Walk outside to garage to ride lawnmower, walks in house. 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    PAIN:  Are you having pain? Soreness in low back - Did not rate    TODAY'S TREATMENT:   THEREX:   Well zone exercises: Pyramid LE Strengthening  Knee ext-  Level 1 x  12 reps Left LE;  Level 2 Right LE x 12 reps Level 2 x 10 reps Left LE:  Level 3 Right LE x 10 reps Level 3 x 8 reps Left LE; level 4 Right LE x 8 reps Level 3 x 6 reps Left  LE; Level 5 Right LE  x 6 reps     Knee flex:  Level 1 Left LE x 12; Level 2 x 12 with right LE Level 2 Left LE x 10  reps; Level 3 x 10 reps with right LE Level  3 Left LE x 8 reps; Level 4 x  8 reps with right LE Level 3 left LE x 6 (last 3 with AAROM) ; Level 5 x 6 reps with right LE  Leg press:  Level 2 right LE x 12 reps; Left LE - level 1 x 12 reps Level 3 right LE x 10 reps; Left LE - level 2 - 10 reps Level 4 right LE x 8 reps ; Left LE - Level 3 - 8 reps Level 5 right LE x 8 reps; Left LE- Level 4 - 8 reps (attempted Level 5- unable to perform)     Calf press:  Level 2 each LE x 12 reps each Level 3 each LE x 10 reps each Level 4 each LE x 8 reps each Level 5 each LE x 6 reps each (Patient struggled on left yet able to complete   Education provided throughout session via VC/TC and demonstration to facilitate movement at target joints and correct muscle activation for all testing and exercises performed.   PATIENT EDUCATION: Education details: exercise technique, body mechanics Person educated: Patient Education method: Customer service manager, verbal cues Education comprehension: verbalized understanding, returned demonstration    HOME EXERCISE PROGRAM: No changes this session   PT Short Term Goals -       PT SHORT TERM GOAL #1   Title Patient will be independent in home exercise program to improve strength/mobility for better functional independence with ADLs.    Baseline 8/9: HEP to be given next session, 10/31: patient reports compliance with HEP and would like progressions added next session. 08/22/2021=Patient verbalized that he is walking and doing his LE exercises with no questions at this time.    Time 4    Period Weeks    Status Achieved    Target Date 08/16/21              PT Long Term Goals       PT LONG TERM GOAL #1   Title Patient will increase FOTO score to equal to or greater than 70%  to demonstrate statistically significant improvement in mobility and quality of life.    Baseline 8/9: 64%; risk adjusted 34%; 03/21/21 FOTO: 45; 04/24/21 FOTO: 49% ; 08/22/2021= 56% 4/20: 60% 11/28/21: 63%; 01/04/2022= 60%   Time 12    Period Weeks     Status On-going    Target Date 03/29/2022     PT LONG TERM GOAL #2   Title Patient will increase Berg Balance score by > 6 points to demonstrate decreased fall risk during functional activities.    Baseline 4/20: 43/56 6/6: 48/56; 713/2023= 50/56   Time 12  Period Weeks    Status Achieved   Target Date 03/29/2022     PT LONG TERM GOAL #3   Title Patient will increase 10 meter walk test to >1.23ms as to improve gait speed for better community ambulation and to reduce fall risk.    Baseline 8/9: 0.65 m/s with rollator; 10/31: 0.68 m/s with rollator. 07/06/2021= 0.71 m/s using rollator; 08/22/2021= 0.94 m/s using rollator. 4/20: 1.1 m/s with rollator    Time 12    Period Weeks    Status Achieved    Target Date 10/11/21      PT LONG TERM GOAL #4   Title Patient will increase six minute walk test distance to >1000 for progression to community ambulator and improve gait ability    Baseline 8/9: 505 ft with rollator; 03/21/21: 7750fc rollator, 04/24/21: 75535f rollator; 07/06/2021=900 feet; 08/22/2021= 935 feet with use of Rollator 44/20: 940 ft 11/28/21: 1055 feet with rollator    Time 12    Period Weeks    Status Achieved    Target Date 01/04/22      PT LONG TERM GOAL #5   Title Patient will increase glute medius strength on Left LE from 3-/5 to 4/5 to improve stability, gait mechanics, and functional strength.    Baseline 10/31: 3-/5; 07/06/2021= 3-/5; 08/22/2021=3-/5 unable to achieve full ROM yet able to withstand some resistance with testing 4/20: unable to obtainfull ROM against gravity 6/6: unable to move against gravity; 01/04/2022= Continues to have difficulty - unable to raise left LE in sidelye against gravity but able to perform supine left Hip abd with some resistance= 3-/5   Time 12    Period Weeks    Status On-going    Target Date 03/29/2022     PT LONG TERM GOAL #6   Title Patient will increase Left single leg stance time to 15 seconds or greater to increase safety in  shower and independence with ADLs.    Baseline 10/31; 1.83 sec without UE support; 08/22/2021=2-3  sec  on left; 6 sec on right 4/20: RLE 10 seconds LLE unable to perform 6/6: R LE 4 seconds during BERG; 01/04/2022= left = 3 sec and right = 8 sec; 02/13/2022= 20 sec on right at best and 4 sec on left LE at best.   Time 12    Period Weeks    Status On-going    Target Date 03/29/2022     PT LONG TERM GOAL #7   Title Patient will safely negotiate 13 steps with handrails to safely enter/exit mothers house.    Baseline 4/20: very challenging 6/6: able to copmlete safely even managing dogs per pt report; 01/04/2022- Patient performed 16 steps with B Rail-no difficulty other than fatigue.    Time 12    Period Weeks    Status Achieved   Target Date 01/04/22              Plan - 12/13/21 1340     Clinical Impression Statement Patient continues to demo good progress with pyramid strengthening. He was able to increase resistance in some exercises vs. Previous visits without significant difficulty other than fatigue. Pt conitnues to make progress toward goals of treatment overall and will continue to benefit from skilled PT interventions to improve his overall strength, mobility and quality of life.    Personal Factors and Comorbidities Age;Comorbidity 3+;Fitness;Past/Current Experience;Time since onset of injury/illness/exacerbation    Comorbidities aroxysmal a fib, MS, lymphedema, hypothyroidism, Graves Disease, arthritis, lumbar herniated disc (  L5), depression and neuropathy    Examination-Activity Limitations Bed Mobility;Bend;Caring for Others;Carry;Reach Overhead;Locomotion Level;Lift;Hygiene/Grooming;Dressing;Squat;Stairs;Stand;Toileting;Transfers    Examination-Participation Restrictions Cleaning;Community Activity;Laundry;Occupation;Shop;Volunteer;Yard Work    Merchant navy officer Evolving/Moderate complexity    Rehab Potential Fair    PT Frequency 2x / week    PT Duration 12  weeks    PT Treatment/Interventions ADLs/Self Care Home Management;Aquatic Therapy;Canalith Repostioning;Biofeedback;Cryotherapy;Ultrasound;Traction;Moist Heat;Electrical Stimulation;DME Instruction;Gait training;Therapeutic exercise;Therapeutic activities;Functional mobility training;Stair training;Balance training;Neuromuscular re-education;Patient/family education;Manual techniques;Orthotic Fit/Training;Compression bandaging;Passive range of motion;Vestibular;Taping;Splinting;Energy conservation;Dry needling;Visual/perceptual remediation/compensation    PT Next Visit Plan Continue with progessive balance training, Progressive LE strenthening as appropriate.    PT Home Exercise Plan Access Code: [email or web address] URL: [email or web address]  Added Bridges with BTB and encouraged more walking around the home during commerical breaks when watching TV.    Consulted and Agree with Plan of Care Patient              Lewis Moccasin PT 12:30 PM, 03/14/22 Physical Therapist - [place] [phone]     12/13/2021, 1:41 PM

## 2022-03-15 ENCOUNTER — Ambulatory Visit: Payer: Medicare PPO

## 2022-03-15 DIAGNOSIS — R2689 Other abnormalities of gait and mobility: Secondary | ICD-10-CM | POA: Diagnosis not present

## 2022-03-15 DIAGNOSIS — I890 Lymphedema, not elsewhere classified: Secondary | ICD-10-CM | POA: Diagnosis not present

## 2022-03-15 DIAGNOSIS — R262 Difficulty in walking, not elsewhere classified: Secondary | ICD-10-CM

## 2022-03-15 DIAGNOSIS — R2681 Unsteadiness on feet: Secondary | ICD-10-CM | POA: Diagnosis not present

## 2022-03-15 DIAGNOSIS — R278 Other lack of coordination: Secondary | ICD-10-CM

## 2022-03-15 DIAGNOSIS — R269 Unspecified abnormalities of gait and mobility: Secondary | ICD-10-CM | POA: Diagnosis not present

## 2022-03-15 DIAGNOSIS — M6281 Muscle weakness (generalized): Secondary | ICD-10-CM

## 2022-03-15 NOTE — Therapy (Signed)
OUTPATIENT PHYSICAL THERAPY TREATMENT NOTE  Patient Name: Dustin Gray MRN: [id number] DOB:1955-07-03, 66 y.o., male 98 Date: 03/15/2022  PCP: Dustin Arabian, MD REFERRING PROVIDER: [name], MD   PT End of Session - 03/15/22 1444     Visit Number 78    Number of Visits 90    Date for PT Re-Evaluation 04/24/22    Authorization Type [employer] Auth 11/28/21-02/22/22 for 24 visits    Authorization Time Period Cert for 07/24/84-57/8/46    Progress Note Due on Visit 80    PT Start Time 1348    PT Stop Time 1432    PT Time Calculation (min) 44 min    Equipment Utilized During Treatment Gait belt    Activity Tolerance Patient tolerated treatment well;No increased pain;Patient limited by fatigue    Behavior During Therapy [facility] for tasks assessed/performed                   Past Medical History:  Diagnosis Date   Abscess    groin   Arthritis    lower spine   Erectile dysfunction    Low testosterone    Lumbar herniated disc    L5   Multiple sclerosis ([place])    Staph aureus infection    Past Surgical History:  Procedure Laterality Date   CARDIOVERSION N/A 11/22/2021   Procedure: CARDIOVERSION;  Surgeon: Dustin Sable, MD;  Location: [facility];  Service: Cardiovascular;  Laterality: N/A;   COLONOSCOPY WITH PROPOFOL N/A 07/04/2016   Procedure: COLONOSCOPY WITH PROPOFOL;  Surgeon: Dustin Lame, MD;  Location: [place];  Service: Endoscopy;  Laterality: N/A;   [place]   POLYPECTOMY  07/04/2016   Procedure: POLYPECTOMY;  Surgeon: Dustin Lame, MD;  Location: [place];  Service: Endoscopy;;   TONSILLECTOMY     Patient Active Problem List   Diagnosis Date Noted   Persistent atrial fibrillation (Dustin Gray)    Graves disease    Abdominal bloating    Atrial fibrillation with RVR (Dustin Gray) 10/01/2019   Hyperthyroidism    Atrial fibrillation with rapid ventricular response (Dustin Gray) 09/30/2019   Leg edema     Left leg cellulitis    Special screening for malignant neoplasms, colon    Polyp of sigmoid colon    Benign neoplasm of ascending colon    Rectal polyp    Herniated nucleus pulposus, L5-S1 11/09/2015   Low back pain 10/25/2015   Herniated nucleus pulposus, C5-6 right 10/06/2015   Dysesthesia 09/16/2015   Spasticity 09/16/2015   Unsteady gait 09/16/2015   Multiple sclerosis (Dustin Gray) 06/08/2011     REFERRING DIAG: R26.81 (ICD-10-CM) - Unsteadiness on feet   THERAPY DIAG:  Unsteadiness on feet  Difficulty in walking, not elsewhere classified  Abnormality of gait and mobility  Muscle weakness (generalized)  Rationale for Evaluation and Treatment Rehabilitation  PERTINENT HISTORY: Patient presents to physical therapy for unsteadiness, walking instability, fatigue. His PMH includes paroxysmal a fib, MS, lymphedema, hypothyroidism, Graves Disease, arthritis, lumbar herniated disc (L5), depression and neuropathy. Patient first developed symptoms of MS in 2005 and was diagnosed in 2006. Patient has done PT in the past, but hasn't been seen since 2020 at [facility]. Has not been doing his exercises in the past year. Walk outside to garage to ride lawnmower, walks in house. 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 No    TODAY'S TREATMENT:   THEREX:   Well zone exercises: Pyramid LE Strengthening  Knee ext-  Level 1 x  12 reps Left LE;  Level 2 Right LE x 12 reps Level 2 x 10 reps Left LE:  Level 3 Right LE x 10 reps Level 3 x 8 reps Left LE; level 4 Right LE x 8 reps Level 4 x 6 reps Left  LE; Level 5 Right LE  x 6 reps     Knee flex:  Level 1 Left LE x 12; Level 2 x 12 with right LE Level 2 Left LE x 10  reps; Level 3 x 10 reps with right LE Level  3 Left LE x 8 reps; Level 4 x 8 reps with right  LE Level 4 left LE x 6 (last 3 with AAROM) ; Level 5 x 6 reps with right LE  Leg press:  Level 2 right LE x 12 reps; Left LE - level 1 x 12 reps Level 3 right LE x 10 reps; Left LE - level 2 - 10 reps Level 4 right LE x 8 reps ; Left LE - Level 3 - 8 reps Level 5 right LE x 8 reps; Left LE- Level 4 - 8 reps      Calf press:  Level 2 each LE x 12 reps each Level 3 each LE x 10 reps each Level 4 each LE x 8 reps each Level 5 each LE x 6 reps each    Education provided throughout session via VC/TC and demonstration to facilitate movement at target joints and correct muscle activation for all testing and exercises performed.   PATIENT EDUCATION: Education details: exercise technique, body mechanics Person educated: Patient Education method: Customer service manager, verbal cues Education comprehension: verbalized understanding, returned demonstration    HOME EXERCISE PROGRAM: No changes this session   PT Short Term Goals -       PT SHORT TERM GOAL #1   Title Patient will be independent in home exercise program to improve strength/mobility for better functional independence with ADLs.    Baseline 8/9: HEP to be given next session, 10/31: patient reports compliance with HEP and would like progressions added next session. 08/22/2021=Patient verbalized that he is walking and doing his LE exercises with no questions at this time.    Time 4    Period Weeks    Status Achieved    Target Date 08/16/21              PT Long Term Goals       PT LONG TERM GOAL #1   Title Patient will increase FOTO score to equal to or greater than 70%  to demonstrate statistically significant improvement in mobility and quality of life.    Baseline 8/9: 64%; risk adjusted 34%; 03/21/21 FOTO: 45; 04/24/21 FOTO: 49% ; 08/22/2021= 56% 4/20: 60% 11/28/21: 63%; 01/04/2022= 60%   Time 12    Period Weeks    Status On-going    Target Date 03/29/2022     PT LONG TERM GOAL #2   Title Patient will  increase Berg Balance score by > 6 points to demonstrate decreased fall risk during functional activities.    Baseline 4/20: 43/56 6/6: 48/56; 713/2023= 50/56   Time 12    Period Weeks    Status Achieved   Target Date 03/29/2022     PT  LONG TERM GOAL #3   Title Patient will increase 10 meter walk test to >1.6ms as to improve gait speed for better community ambulation and to reduce fall risk.    Baseline 8/9: 0.65 m/s with rollator; 10/31: 0.68 m/s with rollator. 07/06/2021= 0.71 m/s using rollator; 08/22/2021= 0.94 m/s using rollator. 4/20: 1.1 m/s with rollator    Time 12    Period Weeks    Status Achieved    Target Date 10/11/21      PT LONG TERM GOAL #4   Title Patient will increase six minute walk test distance to >1000 for progression to community ambulator and improve gait ability    Baseline 8/9: 505 ft with rollator; 03/21/21: 7755fc rollator, 04/24/21: 75545f rollator; 07/06/2021=900 feet; 08/22/2021= 935 feet with use of Rollator 44/20: 940 ft 11/28/21: 1055 feet with rollator    Time 12    Period Weeks    Status Achieved    Target Date 01/04/22      PT LONG TERM GOAL #5   Title Patient will increase glute medius strength on Left LE from 3-/5 to 4/5 to improve stability, gait mechanics, and functional strength.    Baseline 10/31: 3-/5; 07/06/2021= 3-/5; 08/22/2021=3-/5 unable to achieve full ROM yet able to withstand some resistance with testing 4/20: unable to obtainfull ROM against gravity 6/6: unable to move against gravity; 01/04/2022= Continues to have difficulty - unable to raise left LE in sidelye against gravity but able to perform supine left Hip abd with some resistance= 3-/5   Time 12    Period Weeks    Status On-going    Target Date 03/29/2022     PT LONG TERM GOAL #6   Title Patient will increase Left single leg stance time to 15 seconds or greater to increase safety in shower and independence with ADLs.    Baseline 10/31; 1.83 sec without UE support; 08/22/2021=2-3   sec  on left; 6 sec on right 4/20: RLE 10 seconds LLE unable to perform 6/6: R LE 4 seconds during BERG; 01/04/2022= left = 3 sec and right = 8 sec; 02/13/2022= 20 sec on right at best and 4 sec on left LE at best.   Time 12    Period Weeks    Status On-going    Target Date 03/29/2022     PT LONG TERM GOAL #7   Title Patient will safely negotiate 13 steps with handrails to safely enter/exit mothers house.    Baseline 4/20: very challenging 6/6: able to copmlete safely even managing dogs per pt report; 01/04/2022- Patient performed 16 steps with B Rail-no difficulty other than fatigue.    Time 12    Period Weeks    Status Achieved   Target Date 01/04/22              Plan - 12/13/21 1340     Clinical Impression Statement Patient was able to demo slight progression in 2 weeks of resistive strength training as seen by ability to increased resistance with several LE strengthening exercises. He required less physical assist as well to utilize the equipment. Pt conitnues to make progress toward goals of treatment overall and will continue to benefit from skilled PT interventions to improve his overall strength, mobility and quality of life.    Personal Factors and Comorbidities Age;Comorbidity 3+;Fitness;Past/Current Experience;Time since onset of injury/illness/exacerbation    Comorbidities aroxysmal a fib, MS, lymphedema, hypothyroidism, Graves Disease, arthritis, lumbar herniated disc (L5), depression and neuropathy  Examination-Activity Limitations Bed Mobility;Bend;Caring for Others;Carry;Reach Overhead;Locomotion Level;Lift;Hygiene/Grooming;Dressing;Squat;Stairs;Stand;Toileting;Transfers    Examination-Participation Restrictions Cleaning;Community Activity;Laundry;Occupation;Shop;Volunteer;Yard Work    Merchant navy officer Evolving/Moderate complexity    Rehab Potential Fair    PT Frequency 2x / week    PT Duration 12 weeks    PT Treatment/Interventions ADLs/Self Care Home  Management;Aquatic Therapy;Canalith Repostioning;Biofeedback;Cryotherapy;Ultrasound;Traction;Moist Heat;Electrical Stimulation;DME Instruction;Gait training;Therapeutic exercise;Therapeutic activities;Functional mobility training;Stair training;Balance training;Neuromuscular re-education;Patient/family education;Manual techniques;Orthotic Fit/Training;Compression bandaging;Passive range of motion;Vestibular;Taping;Splinting;Energy conservation;Dry needling;Visual/perceptual remediation/compensation    PT Next Visit Plan Continue with progessive balance training, Progressive LE strenthening as appropriate.    PT Home Exercise Plan Access Code: [email or web address] URL: [email or web address]  Added Bridges with BTB and encouraged more walking around the home during commerical breaks when watching TV.    Consulted and Agree with Plan of Care Patient              Lewis Moccasin PT 2:45 PM, 03/15/22 Physical Therapist - [place] [phone]     12/13/2021, 1:41 PM

## 2022-03-20 ENCOUNTER — Other Ambulatory Visit: Payer: Self-pay | Admitting: Cardiology

## 2022-03-20 ENCOUNTER — Ambulatory Visit: Payer: Medicare PPO

## 2022-03-20 DIAGNOSIS — R262 Difficulty in walking, not elsewhere classified: Secondary | ICD-10-CM | POA: Diagnosis not present

## 2022-03-20 DIAGNOSIS — M6281 Muscle weakness (generalized): Secondary | ICD-10-CM

## 2022-03-20 DIAGNOSIS — R2689 Other abnormalities of gait and mobility: Secondary | ICD-10-CM | POA: Diagnosis not present

## 2022-03-20 DIAGNOSIS — R278 Other lack of coordination: Secondary | ICD-10-CM | POA: Diagnosis not present

## 2022-03-20 DIAGNOSIS — R269 Unspecified abnormalities of gait and mobility: Secondary | ICD-10-CM | POA: Diagnosis not present

## 2022-03-20 DIAGNOSIS — I890 Lymphedema, not elsewhere classified: Secondary | ICD-10-CM | POA: Diagnosis not present

## 2022-03-20 DIAGNOSIS — R2681 Unsteadiness on feet: Secondary | ICD-10-CM | POA: Diagnosis not present

## 2022-03-20 MED ORDER — DILTIAZEM HCL ER 120 MG PO CP12: 120.0000 mg | ORAL_CAPSULE | Freq: Two times a day (BID) | ORAL | 2 refills | Status: DC

## 2022-03-20 NOTE — Therapy (Signed)
OUTPATIENT PHYSICAL THERAPY TREATMENT NOTE  Patient Name: Dustin Gray MRN: [id number] DOB:[birth year], 66 y.o., male 72 Date: 03/21/2022  PCP: [name], MD REFERRING PROVIDER: [name], MD   PT End of Session - 03/20/22 1345     Visit Number 79    Number of Visits 90    Date for PT Re-Evaluation 04/24/22    Authorization Type [employer] Auth 11/28/21-02/22/22 for 24 visits    Authorization Time Period Cert for 6/71/24-58/0/99    Progress Note Due on Visit 80    PT Start Time 1345    PT Stop Time 1427    PT Time Calculation (min) 42 min    Equipment Utilized During Treatment Gait belt    Activity Tolerance Patient tolerated treatment well;No increased pain;Patient limited by fatigue    Behavior During Therapy [facility] for tasks assessed/performed                   Past Medical History:  Diagnosis Date   Abscess    groin   Arthritis    lower spine   Erectile dysfunction    Low testosterone    Lumbar herniated disc    L5   Multiple sclerosis ([place])    Staph aureus infection    Past Surgical History:  Procedure Laterality Date   CARDIOVERSION N/A 11/22/2021   Procedure: CARDIOVERSION;  Surgeon: [name], MD;  Location: [facility];  Service: Cardiovascular;  Laterality: N/A;   COLONOSCOPY WITH PROPOFOL N/A 07/04/2016   Procedure: COLONOSCOPY WITH PROPOFOL;  Surgeon: [name], MD;  Location: [place];  Service: Endoscopy;  Laterality: N/A;   [place]   POLYPECTOMY  07/04/2016   Procedure: POLYPECTOMY;  Surgeon: [name], MD;  Location: [place];  Service: Endoscopy;;   TONSILLECTOMY     Patient Active Problem List   Diagnosis Date Noted   Persistent atrial fibrillation ([place])    Graves disease    Abdominal bloating    Atrial fibrillation with RVR ([place]) 10/01/2019   Hyperthyroidism    Atrial fibrillation with rapid ventricular response ([place]) 09/30/2019   Leg edema     Left leg cellulitis    Special screening for malignant neoplasms, colon    Polyp of sigmoid colon    Benign neoplasm of ascending colon    Rectal polyp    Herniated nucleus pulposus, L5-S1 11/09/2015   Low back pain 10/25/2015   Herniated nucleus pulposus, C5-6 right 10/06/2015   Dysesthesia 09/16/2015   Spasticity 09/16/2015   Unsteady gait 09/16/2015   Multiple sclerosis ([place]) 06/08/2011     REFERRING DIAG: R26.81 (ICD-10-CM) - Unsteadiness on feet   THERAPY DIAG:  Unsteadiness on feet  Difficulty in walking, not elsewhere classified  Abnormality of gait and mobility  Muscle weakness (generalized)  Rationale for Evaluation and Treatment Rehabilitation  PERTINENT HISTORY: Patient presents to physical therapy for unsteadiness, walking instability, fatigue. His PMH includes paroxysmal a fib, MS, lymphedema, hypothyroidism, Graves Disease, arthritis, lumbar herniated disc (L5), depression and neuropathy. Patient first developed symptoms of MS in 2005 and was diagnosed in 2006. Patient has done PT in the past, but hasn't been seen since 2020 at [facility]. Has not been doing his exercises in the past year. Walk outside to garage to ride lawnmower, walks in house. 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 No    TODAY'S TREATMENT:   THEREX:   Well zone exercises: Pyramid LE Strengthening  Knee ext-  Level 1 x  12 reps Left LE;  Level 2 Right LE x 12 reps Level 2 x 10 reps Left LE:  Level 3 Right LE x 10 reps Level 3 x 8 reps Left LE; level 4 Right LE x 8 reps Level 4 x 6 reps Left  LE; Level 5 Right LE  x 6 reps  Level 4 x 4 reps Left LE; level 6 right LE x 4 reps   Knee flex:  Level 1 Left LE x 12; Level 2 x 12 with right LE Level 2 Left LE x 10  reps; Level 3 x 10 reps with right LE Level  3  Left LE x 8 reps; Level 4 x 8 reps with right LE Level 4 left LE x 6 reps ; Level 5 x 6 reps with right LE  Leg press:  Level 2 right LE x 12 reps; Left LE - level 2- 12 reps Level 3 right LE x 10 reps; Left LE - level 3 - 10 reps Level 4 right LE x 8 reps ; Left LE - Level 4 - 8 reps Level 5 right LE x 6 reps; Left LE- Level 5 - 6 reps   Level 6 right LE x 4 reps; Left LE - Level 6 - 4 reps    Calf press:  Level 2 each LE x 12 reps each Level 3 each LE x 10 reps each Level 4 each LE x 8 reps each Level 5 each LE x 6 reps each  Level 6 each LE x 4 reps  Level 7 each LE x 2 reps   Education provided throughout session via VC/TC and demonstration to facilitate movement at target joints and correct muscle activation for all testing and exercises performed.   PATIENT EDUCATION: Education details: exercise technique, body mechanics Person educated: Patient Education method: Customer service manager, verbal cues Education comprehension: verbalized understanding, returned demonstration    HOME EXERCISE PROGRAM: No changes this session   PT Short Term Goals -       PT SHORT TERM GOAL #1   Title Patient will be independent in home exercise program to improve strength/mobility for better functional independence with ADLs.    Baseline 8/9: HEP to be given next session, 10/31: patient reports compliance with HEP and would like progressions added next session. 08/22/2021=Patient verbalized that he is walking and doing his LE exercises with no questions at this time.    Time 4    Period Weeks    Status Achieved    Target Date 08/16/21              PT Long Term Goals       PT LONG TERM GOAL #1   Title Patient will increase FOTO score to equal to or greater than 70%  to demonstrate statistically significant improvement in mobility and quality of life.    Baseline 8/9: 64%; risk adjusted 34%; 03/21/21 FOTO: 45; 04/24/21 FOTO: 49% ; 08/22/2021= 56% 4/20: 60% 11/28/21: 63%;  01/04/2022= 60%   Time 12    Period Weeks    Status On-going    Target Date 03/29/2022     PT LONG TERM GOAL #2   Title Patient will increase Berg Balance score by > 6 points to demonstrate decreased  fall risk during functional activities.    Baseline 4/20: 43/56 6/6: 48/56; 713/2023= 50/56   Time 12    Period Weeks    Status Achieved   Target Date 03/29/2022     PT LONG TERM GOAL #3   Title Patient will increase 10 meter walk test to >1.27ms as to improve gait speed for better community ambulation and to reduce fall risk.    Baseline 8/9: 0.65 m/s with rollator; 10/31: 0.68 m/s with rollator. 07/06/2021= 0.71 m/s using rollator; 08/22/2021= 0.94 m/s using rollator. 4/20: 1.1 m/s with rollator    Time 12    Period Weeks    Status Achieved    Target Date 10/11/21      PT LONG TERM GOAL #4   Title Patient will increase six minute walk test distance to >1000 for progression to community ambulator and improve gait ability    Baseline 8/9: 505 ft with rollator; 03/21/21: 7738fc rollator, 04/24/21: 75540f rollator; 07/06/2021=900 feet; 08/22/2021= 935 feet with use of Rollator 44/20: 940 ft 11/28/21: 1055 feet with rollator    Time 12    Period Weeks    Status Achieved    Target Date 01/04/22      PT LONG TERM GOAL #5   Title Patient will increase glute medius strength on Left LE from 3-/5 to 4/5 to improve stability, gait mechanics, and functional strength.    Baseline 10/31: 3-/5; 07/06/2021= 3-/5; 08/22/2021=3-/5 unable to achieve full ROM yet able to withstand some resistance with testing 4/20: unable to obtainfull ROM against gravity 6/6: unable to move against gravity; 01/04/2022= Continues to have difficulty - unable to raise left LE in sidelye against gravity but able to perform supine left Hip abd with some resistance= 3-/5   Time 12    Period Weeks    Status On-going    Target Date 03/29/2022     PT LONG TERM GOAL #6   Title Patient will increase Left single leg stance time to 15  seconds or greater to increase safety in shower and independence with ADLs.    Baseline 10/31; 1.83 sec without UE support; 08/22/2021=2-3  sec  on left; 6 sec on right 4/20: RLE 10 seconds LLE unable to perform 6/6: R LE 4 seconds during BERG; 01/04/2022= left = 3 sec and right = 8 sec; 02/13/2022= 20 sec on right at best and 4 sec on left LE at best.   Time 12    Period Weeks    Status On-going    Target Date 03/29/2022     PT LONG TERM GOAL #7   Title Patient will safely negotiate 13 steps with handrails to safely enter/exit mothers house.    Baseline 4/20: very challenging 6/6: able to copmlete safely even managing dogs per pt report; 01/04/2022- Patient performed 16 steps with B Rail-no difficulty other than fatigue.    Time 12    Period Weeks    Status Achieved   Target Date 01/04/22              Plan - 12/13/21 1340     Clinical Impression Statement Patient continues to present with good motivation for today's session focusing on LE strengthening. He was again able to increase overall resistance and reps as previously unable to manuevering around equipment with less physical assist. Pt conitnues to make progress toward goals of treatment overall and will continue to benefit from skilled PT interventions to improve his overall strength, mobility and quality of life.  Personal Factors and Comorbidities Age;Comorbidity 3+;Fitness;Past/Current Experience;Time since onset of injury/illness/exacerbation    Comorbidities aroxysmal a fib, MS, lymphedema, hypothyroidism, Graves Disease, arthritis, lumbar herniated disc (L5), depression and neuropathy    Examination-Activity Limitations Bed Mobility;Bend;Caring for Others;Carry;Reach Overhead;Locomotion Level;Lift;Hygiene/Grooming;Dressing;Squat;Stairs;Stand;Toileting;Transfers    Examination-Participation Restrictions Cleaning;Community Activity;Laundry;Occupation;Shop;Volunteer;Yard Work    Merchant navy officer  Evolving/Moderate complexity    Rehab Potential Fair    PT Frequency 2x / week    PT Duration 12 weeks    PT Treatment/Interventions ADLs/Self Care Home Management;Aquatic Therapy;Canalith Repostioning;Biofeedback;Cryotherapy;Ultrasound;Traction;Moist Heat;Electrical Stimulation;DME Instruction;Gait training;Therapeutic exercise;Therapeutic activities;Functional mobility training;Stair training;Balance training;Neuromuscular re-education;Patient/family education;Manual techniques;Orthotic Fit/Training;Compression bandaging;Passive range of motion;Vestibular;Taping;Splinting;Energy conservation;Dry needling;Visual/perceptual remediation/compensation    PT Next Visit Plan Continue with progessive balance training, Progressive LE strenthening as appropriate.    PT Home Exercise Plan Access Code: [email or web address] URL: [email or web address]  Added Bridges with BTB and encouraged more walking around the home during commerical breaks when watching TV.    Consulted and Agree with Plan of Care Patient              Lewis Moccasin PT 9:55 AM, 03/21/22 Physical Therapist - [place] [phone]     12/13/2021, 1:41 PM

## 2022-03-22 ENCOUNTER — Ambulatory Visit: Payer: Medicare PPO

## 2022-03-22 DIAGNOSIS — M6281 Muscle weakness (generalized): Secondary | ICD-10-CM | POA: Diagnosis not present

## 2022-03-22 DIAGNOSIS — R2681 Unsteadiness on feet: Secondary | ICD-10-CM | POA: Diagnosis not present

## 2022-03-22 DIAGNOSIS — R262 Difficulty in walking, not elsewhere classified: Secondary | ICD-10-CM | POA: Diagnosis not present

## 2022-03-22 DIAGNOSIS — R2689 Other abnormalities of gait and mobility: Secondary | ICD-10-CM | POA: Diagnosis not present

## 2022-03-22 DIAGNOSIS — R269 Unspecified abnormalities of gait and mobility: Secondary | ICD-10-CM

## 2022-03-22 DIAGNOSIS — R278 Other lack of coordination: Secondary | ICD-10-CM

## 2022-03-22 DIAGNOSIS — I890 Lymphedema, not elsewhere classified: Secondary | ICD-10-CM | POA: Diagnosis not present

## 2022-03-22 NOTE — Therapy (Signed)
OUTPATIENT PHYSICAL THERAPY TREATMENT NOTE/Physical Therapy Progress Note   Dates of reporting period  02/13/2022   to   03/22/2022  Patient Name: Dustin Gray MRN: [id number] DOB:Aug 04, 1955, 66 y.o., male 50 Date: 03/23/2022  PCP: [name], MD REFERRING PROVIDER: [name], MD   PT End of Session - 03/22/22 1356     Visit Number 80    Number of Visits 37    Date for PT Re-Evaluation 04/24/22    Authorization Type [employer] Auth 11/28/21-02/22/22 for 24 visits    Authorization Time Period Cert for 10/10/38-81/4/48    Progress Note Due on Visit 80    PT Start Time 1345    PT Stop Time 1429    PT Time Calculation (min) 44 min    Equipment Utilized During Treatment Gait belt    Activity Tolerance Patient tolerated treatment well;No increased pain;Patient limited by fatigue    Behavior During Therapy [facility] for tasks assessed/performed                   Past Medical History:  Diagnosis Date   Abscess    groin   Arthritis    lower spine   Erectile dysfunction    Low testosterone    Lumbar herniated disc    L5   Multiple sclerosis ([place])    Staph aureus infection    Past Surgical History:  Procedure Laterality Date   CARDIOVERSION N/A 11/22/2021   Procedure: CARDIOVERSION;  Surgeon: [name], MD;  Location: [facility];  Service: Cardiovascular;  Laterality: N/A;   COLONOSCOPY WITH PROPOFOL N/A 07/04/2016   Procedure: COLONOSCOPY WITH PROPOFOL;  Surgeon: [name], MD;  Location: [place];  Service: Endoscopy;  Laterality: N/A;   [place]   POLYPECTOMY  07/04/2016   Procedure: POLYPECTOMY;  Surgeon: [name], MD;  Location: [place];  Service: Endoscopy;;   TONSILLECTOMY     Patient Active Problem List   Diagnosis Date Noted   Persistent atrial fibrillation ([place])    Graves disease    Abdominal bloating    Atrial fibrillation with RVR ([place]) 10/01/2019   Hyperthyroidism     Atrial fibrillation with rapid ventricular response ([place]) 09/30/2019   Leg edema    Left leg cellulitis    Special screening for malignant neoplasms, colon    Polyp of sigmoid colon    Benign neoplasm of ascending colon    Rectal polyp    Herniated nucleus pulposus, L5-S1 11/09/2015   Low back pain 10/25/2015   Herniated nucleus pulposus, C5-6 right 10/06/2015   Dysesthesia 09/16/2015   Spasticity 09/16/2015   Unsteady gait 09/16/2015   Multiple sclerosis ([place]) 06/08/2011     REFERRING DIAG: R26.81 (ICD-10-CM) - Unsteadiness on feet   THERAPY DIAG:  Unsteadiness on feet  Difficulty in walking, not elsewhere classified  Abnormality of gait and mobility  Muscle weakness (generalized)  Rationale for Evaluation and Treatment Rehabilitation  PERTINENT HISTORY: Patient presents to physical therapy for unsteadiness, walking instability, fatigue. His PMH includes paroxysmal a fib, MS, lymphedema, hypothyroidism, Graves Disease, arthritis, lumbar herniated disc (L5), depression and neuropathy. Patient first developed symptoms of MS in 2005 and was diagnosed in 2006. Patient has done PT in the past, but hasn't been seen since 2020 at [facility]. Has not been doing his exercises in the past year. Walk outside to garage to ride lawnmower, walks in house. Retired Automotive engineer. Has a rollator at home but  doesn't use it in the house   PRECAUTIONS: Fall  SUBJECTIVE: Pt reports doing well overall and feeling stronger - denies any pain or falls.    PAIN:  Are you having pain? No    TODAY'S TREATMENT:    Reassessed Muscle strength- See goal section for details Reassessed Single leg stance- See goal section for details  THEREX:   Sidelye on right performing clamshell- 2 sets of 12 reps  Supine Hip flex/abd/add arc over yoga block- each LE - 12 reps each Supine heel slides (Alt LE) 2x 12 reps  Supine bridge with right LE on large (peanut shape lime green ball) and left knee  flexed- x 15 reps.   Education provided throughout session via VC/TC and demonstration to facilitate movement at target joints and correct muscle activation for all testing and exercises performed.   PATIENT EDUCATION: Education details: exercise technique, body mechanics Person educated: Patient Education method: Customer service manager, verbal cues Education comprehension: verbalized understanding, returned demonstration    HOME EXERCISE PROGRAM: No changes this session   PT Short Term Goals -       PT SHORT TERM GOAL #1   Title Patient will be independent in home exercise program to improve strength/mobility for better functional independence with ADLs.    Baseline 8/9: HEP to be given next session, 10/31: patient reports compliance with HEP and would like progressions added next session. 08/22/2021=Patient verbalized that he is walking and doing his LE exercises with no questions at this time.    Time 4    Period Weeks    Status Achieved    Target Date 08/16/21              PT Long Term Goals       PT LONG TERM GOAL #1   Title Patient will increase FOTO score to equal to or greater than 70%  to demonstrate statistically significant improvement in mobility and quality of life.    Baseline 8/9: 64%; risk adjusted 34%; 03/21/21 FOTO: 45; 04/24/21 FOTO: 49% ; 08/22/2021= 56% 4/20: 60% 11/28/21: 63%; 01/04/2022= 60%   Time 12    Period Weeks    Status On-going    Target Date 03/29/2022     PT LONG TERM GOAL #2   Title Patient will increase Berg Balance score by > 6 points to demonstrate decreased fall risk during functional activities.    Baseline 4/20: 43/56 6/6: 48/56; 713/2023= 50/56   Time 12    Period Weeks    Status Achieved   Target Date 03/29/2022     PT LONG TERM GOAL #3   Title Patient will increase 10 meter walk test to >1.61ms as to improve gait speed for better community ambulation and to reduce fall risk.    Baseline 8/9: 0.65 m/s with rollator; 10/31:  0.68 m/s with rollator. 07/06/2021= 0.71 m/s using rollator; 08/22/2021= 0.94 m/s using rollator. 4/20: 1.1 m/s with rollator    Time 12    Period Weeks    Status Achieved    Target Date 10/11/21      PT LONG TERM GOAL #4   Title Patient will increase six minute walk test distance to >1000 for progression to community ambulator and improve gait ability    Baseline 8/9: 505 ft with rollator; 03/21/21: 7752fc rollator, 04/24/21: 75541f rollator; 07/06/2021=900 feet; 08/22/2021= 935 feet with use of Rollator 44/20: 940 ft 11/28/21: 1055 feet with rollator    Time 12    Period Weeks  Status Achieved    Target Date 01/04/22      PT LONG TERM GOAL #5   Title Patient will increase glute medius strength on Left LE from 3-/5 to 4/5 to improve stability, gait mechanics, and functional strength.    Baseline 10/31: 3-/5; 07/06/2021= 3-/5; 08/22/2021=3-/5 unable to achieve full ROM yet able to withstand some resistance with testing 4/20: unable to obtainfull ROM against gravity 6/6: unable to move against gravity; 01/04/2022= Continues to have difficulty - unable to raise left LE in sidelye against gravity but able to perform supine left Hip abd with some resistance= 3-/5; 03/22/2022- Patient able to raise his left LE 3 in off mat today.    Time 12    Period Weeks    Status On-going    Target Date 03/29/2022     PT LONG TERM GOAL #6   Title Patient will increase Left single leg stance time to 15 seconds or greater to increase safety in shower and independence with ADLs.    Baseline 10/31; 1.83 sec without UE support; 08/22/2021=2-3  sec  on left; 6 sec on right 4/20: RLE 10 seconds LLE unable to perform 6/6: R LE 4 seconds during BERG; 01/04/2022= left = 3 sec and right = 8 sec; 02/13/2022= 20 sec on right at best and 4 sec on left LE at best. 03/23/2022=Patient able to stand on left LE at best for 7 sec today.   Time 12    Period Weeks    Status On-going    Target Date 03/29/2022     PT LONG TERM GOAL #7    Title Patient will safely negotiate 13 steps with handrails to safely enter/exit mothers house.    Baseline 4/20: very challenging 6/6: able to copmlete safely even managing dogs per pt report; 01/04/2022- Patient performed 16 steps with B Rail-no difficulty other than fatigue.    Time 12    Period Weeks    Status Achieved   Target Date 01/04/22              Plan - 12/13/21 1340     Clinical Impression Statement Patient continues to present with good motivation for today's session including reassessment of goals for progress note. He was able to demo good progress on two of his remaining goas- exhibiting improved single leg stance time on left LE and improved Left LE hip strength as he was able to lift his LE up off mat for 1st time today. He continues to present with overall Left LE muscle weakness and difficulty with mobility. Patient's condition has the potential to improve in response to therapy. Maximum improvement is yet to be obtained. The anticipated improvement is attainable and reasonable in a generally predictable time.   Pt conitnues to make progress toward goals of treatment overall and will continue to benefit from skilled PT interventions to improve his overall strength, mobility and quality of life.    Personal Factors and Comorbidities Age;Comorbidity 3+;Fitness;Past/Current Experience;Time since onset of injury/illness/exacerbation    Comorbidities aroxysmal a fib, MS, lymphedema, hypothyroidism, Graves Disease, arthritis, lumbar herniated disc (L5), depression and neuropathy    Examination-Activity Limitations Bed Mobility;Bend;Caring for Others;Carry;Reach Overhead;Locomotion Level;Lift;Hygiene/Grooming;Dressing;Squat;Stairs;Stand;Toileting;Transfers    Examination-Participation Restrictions Cleaning;Community Activity;Laundry;Occupation;Shop;Volunteer;Yard Work    Merchant navy officer Evolving/Moderate complexity    Rehab Potential Fair    PT Frequency 2x /  week    PT Duration 12 weeks    PT Treatment/Interventions ADLs/Self Care Home Management;Aquatic Therapy;Canalith Repostioning;Biofeedback;Cryotherapy;Ultrasound;Traction;Moist Heat;Electrical Stimulation;DME Instruction;Gait training;Therapeutic exercise;Therapeutic activities;Functional  mobility training;Stair training;Balance training;Neuromuscular re-education;Patient/family education;Manual techniques;Orthotic Fit/Training;Compression bandaging;Passive range of motion;Vestibular;Taping;Splinting;Energy conservation;Dry needling;Visual/perceptual remediation/compensation    PT Next Visit Plan Continue with progessive balance training, Progressive LE strenthening as appropriate.    PT Home Exercise Plan Access Code: [email or web address] URL: [email or web address]  Added Bridges with BTB and encouraged more walking around the home during commerical breaks when watching TV.    Consulted and Agree with Plan of Care Patient              Lewis Moccasin PT 12:45 PM, 03/23/22 Physical Therapist - [place] [phone]     12/13/2021, 1:41 PM

## 2022-03-23 ENCOUNTER — Ambulatory Visit: Payer: Medicare PPO | Attending: Cardiology | Admitting: Cardiology

## 2022-03-23 ENCOUNTER — Encounter: Payer: Self-pay | Admitting: Cardiology

## 2022-03-23 VITALS — BP 102/62 | HR 64 | Ht 72.0 in | Wt 264.0 lb

## 2022-03-23 DIAGNOSIS — E0590 Thyrotoxicosis, unspecified without thyrotoxic crisis or storm: Secondary | ICD-10-CM

## 2022-03-23 DIAGNOSIS — I4819 Other persistent atrial fibrillation: Secondary | ICD-10-CM | POA: Diagnosis not present

## 2022-03-23 DIAGNOSIS — I429 Cardiomyopathy, unspecified: Secondary | ICD-10-CM

## 2022-03-23 NOTE — Patient Instructions (Signed)
Medication Instructions:   Your physician recommends that you continue on your current medications as directed. Please refer to the Current Medication list given to you today.  *If you need a refill on your cardiac medications before your next appointment, please call your pharmacy*    Follow-Up: At [facility], you and your health needs are our priority.  As part of our continuing mission to provide you with exceptional heart care, we have created designated Provider Care Teams.  These Care Teams include your primary Cardiologist (physician) and Advanced Practice Providers (APPs -  Physician Assistants and Nurse Practitioners) who all work together to provide you with the care you need, when you need it.  We recommend signing up for the patient portal called "MyChart".  Sign up information is provided on this After Visit Summary.  MyChart is used to connect with patients for Virtual Visits (Telemedicine).  Patients are able to view lab/test results, encounter notes, upcoming appointments, etc.  Non-urgent messages can be sent to your provider as well.   To learn more about what you can do with MyChart, go to [email or web address].    Your next appointment:   6 month(s)  The format for your next appointment:   In Person  Provider:   You may see [name], MD or one of the following Advanced Practice Providers on your designated Care Team:   Murray Hodgkins, NP Christell Faith, PA-C Cadence Kathlen Mody, PA-C Gerrie Nordmann, NP    Other Instructions   Important Information About Sugar

## 2022-03-23 NOTE — Progress Notes (Signed)
Cardiology Office Note:    Date:  03/23/2022   ID:  Dustin Gray, DOB [birth year], MRN [id number]  PCP:  [name], MD  Cardiologist:  [name], MD  Electrophysiologist:  [name], MD   Referring MD: [name], MD   Chief Complaint  Patient presents with   Follow-up    3 month f/u,no new cardiac concerns     History of Present Illness:    Dustin Gray is a 66 y.o. male with a hx of persistent atrial fibrillation s/p DCCV 5/23, multiple sclerosis, lymphedema, hyperthyroidism/Graves' disease, who presents for follow-up.    Being seen for persistent A-fib driven by abnormal thyroid function/hyperthyroidism.  Hypothyroidism being managed by endocrinology team.  He has maintained sinus rhythm since last cardioversion on 11/22/2021.  Currently takes Inderal, Cardizem and Eliquis for stroke prophylaxis.  Denies any bleeding issues.  Has a repeat echocardiogram ordered next week, previous echo showed mildly reduced ejection fraction.  Prior notes Echo 08/2021 EF 40 to 45% Patient originally seen in the hospital for atrial fibrillation with rapid ventricular response.  He was managed with diltiazem and Eliquis.  He was subsequently diagnosed with hyperthyroidism and propanolol ER added to his medical regimen.   Echocardiogram on 09/2019 showed normal ejection fraction with EF 55 to 60%, left atrial size was also normal.   He was started on anticoagulation due to plan for cardioversion.  Cardioversion was attempted but unsuccessful.  His heart rates have improved when hyperthyroidism is controlled.  Past Medical History:  Diagnosis Date   Abscess    groin   Arthritis    lower spine   Erectile dysfunction    Low testosterone    Lumbar herniated disc    L5   Multiple sclerosis ([place])    Staph aureus infection     Past Surgical History:  Procedure Laterality Date   CARDIOVERSION N/A 11/22/2021   Procedure: CARDIOVERSION;  Surgeon: [name],  MD;  Location: [facility];  Service: Cardiovascular;  Laterality: N/A;   COLONOSCOPY WITH PROPOFOL N/A 07/04/2016   Procedure: COLONOSCOPY WITH PROPOFOL;  Surgeon: [name], MD;  Location: [place];  Service: Endoscopy;  Laterality: N/A;   [place]   POLYPECTOMY  07/04/2016   Procedure: POLYPECTOMY;  Surgeon: [name], MD;  Location: [facility];  Service: Endoscopy;;   TONSILLECTOMY      Current Medications: Current Meds  Medication Sig   apixaban (ELIQUIS) 5 MG TABS tablet Take 1 tablet (5 mg total) by mouth 2 (two) times daily.   baclofen (LIORESAL) 10 MG tablet Take 10 mg by mouth 3 (three) times daily.   diltiazem (CARDIZEM SR) 120 MG 12 hr capsule Take 1 capsule (120 mg total) by mouth 2 (two) times daily.   Dimethyl Fumarate 240 MG CPDR Take 240 mg by mouth 2 (two) times daily.    furosemide (LASIX) 40 MG tablet Take 1 tablet (40 mg total) by mouth as needed. With your potassium.   gabapentin (NEURONTIN) 300 MG capsule Take 300-600 mg by mouth See admin instructions. Take 1 capsule (300 mg) by mouth in the morning, take 1 capsule (300 mg) by mouth at midday, & take 2 capsules (600 mg) by mouth at bedtime.   meloxicam (MOBIC) 15 MG tablet Take 15 mg by mouth daily as needed for pain.    methimazole (TAPAZOLE) 5 MG tablet Take 0.5 tablets by mouth daily.   potassium chloride (KLOR-CON) 10  MEQ tablet Take 1 tablet (10 mEq total) by mouth as needed. Take with your Lasix.   propranolol ER (INDERAL LA) 80 MG 24 hr capsule Take 1 capsule (80 mg total) by mouth 2 (two) times daily.     Allergies:   Cephalexin, Ancef [cefazolin sodium], and Cefadroxil   Social History   Socioeconomic History   Marital status: Legally Separated    Spouse name: Not on file   Number of children: Not on file   Years of education: Not on file   Highest education level: Not on file  Occupational History   Not on file  Tobacco Use   Smoking status:  Former   Smokeless tobacco: Never   Tobacco comments:    smoked as teenager  Vaping Use   Vaping Use: Never used  Substance and Sexual Activity   Alcohol use: Not Currently    Comment: 2 drinks/month   Drug use: No   Sexual activity: Not on file  Other Topics Concern   Not on file  Social History Narrative   Not on file   Social Determinants of Health   Financial Resource Strain: Not on file  Food Insecurity: Not on file  Transportation Needs: Not on file  Physical Activity: Not on file  Stress: Not on file  Social Connections: Not on file     Family History: The patient's family history includes Diabetes in his father.  ROS:   Please see the history of present illness.     All other systems reviewed and are negative.  EKGs/Labs/Other Studies Reviewed:    The following studies were reviewed today:   EKG:  EKG is  ordered today.  The ekg ordered today demonstrates normal sinus rhythm, normal ECG, heart rate 64  Recent Labs: 10/27/2021: BUN 13; Creatinine, Ser 0.92; Hemoglobin 15.9; Platelets 217; Potassium 4.7; Sodium 140  Recent Lipid Panel    Component Value Date/Time   CHOL 108 10/01/2019 0358   TRIG 81 10/01/2019 0358   HDL 35 (L) 10/01/2019 0358   CHOLHDL 3.1 10/01/2019 0358   VLDL 16 10/01/2019 0358   LDLCALC 57 10/01/2019 0358    Physical Exam:    VS:  BP 102/62 (BP Location: Left Arm, Patient Position: Sitting, Cuff Size: Large)   Pulse 64   Ht 6' (1.829 m)   Wt 264 lb (119.7 kg)   SpO2 94%   BMI 35.80 kg/m     Wt Readings from Last 3 Encounters:  03/23/22 264 lb (119.7 kg)  02/21/22 260 lb (117.9 kg)  12/22/21 261 lb 9.6 oz (118.7 kg)     GEN:  Well nourished, well developed in no acute distress HEENT: Normal NECK: No JVD; No carotid bruits CARDIAC: Regular rate and rhythm RESPIRATORY:  Clear to auscultation without rales, wheezing or rhonchi  ABDOMEN: Soft, non-tender, non-distended MUSCULOSKELETAL:  no edema; No deformity  SKIN: Warm  and dry NEUROLOGIC:  Alert and oriented x 3 PSYCHIATRIC:  Normal affect   ASSESSMENT:    1. Persistent atrial fibrillation (HCC)   2. Cardiomyopathy, unspecified type ([place])   3. Hyperthyroidism    PLAN:    In order of problems listed above:  Persistent atrial fibrillation.  S/p DC cardioversion 11/22/2021.  Maintaining sinus rhythm . Continue Cardizem SR 120 twice daily, continue Inderal, continue Eliquis 5 mg twice daily.  A-fib driven by thyroid dysfunction.   Cardiomyopathy EF 40 to 45%.  Likely tachycardia/A-fib induced.  Continue Cardizem and Inderal for now.  Repeat echo planned  for next week. hyperthyroidism.  On methimazole. management as per endocrinology.  Follow-up in 6 months.   Total encounter time 35 minutes  Greater than 50% was spent in counseling and coordination of care with the patient    Medication Adjustments/Labs and Tests Ordered: Current medicines are reviewed at length with the patient today.  Concerns regarding medicines are outlined above.  Orders Placed This Encounter  Procedures   EKG 12-Lead     No orders of the defined types were placed in this encounter.   Patient Instructions  Medication Instructions:   Your physician recommends that you continue on your current medications as directed. Please refer to the Current Medication list given to you today.  *If you need a refill on your cardiac medications before your next appointment, please call your pharmacy*    Follow-Up: At [facility], you and your health needs are our priority.  As part of our continuing mission to provide you with exceptional heart care, we have created designated Provider Care Teams.  These Care Teams include your primary Cardiologist (physician) and Advanced Practice Providers (APPs -  Physician Assistants and Nurse Practitioners) who all work together to provide you with the care you need, when you need it.  We recommend signing up for the patient portal  called "MyChart".  Sign up information is provided on this After Visit Summary.  MyChart is used to connect with patients for Virtual Visits (Telemedicine).  Patients are able to view lab/test results, encounter notes, upcoming appointments, etc.  Non-urgent messages can be sent to your provider as well.   To learn more about what you can do with MyChart, go to [email or web address].    Your next appointment:   6 month(s)  The format for your next appointment:   In Person  Provider:   You may see [name], MD or one of the following Advanced Practice Providers on your designated Care Team:   Murray Hodgkins, NP Christell Faith, PA-C Cadence Kathlen Mody, PA-C Gerrie Nordmann, NP    Other Instructions   Important Information About Sugar         Signed, [name], MD  03/23/2022 11:12 AM    [place]

## 2022-03-27 ENCOUNTER — Ambulatory Visit: Payer: Medicare PPO | Attending: Neurology

## 2022-03-27 DIAGNOSIS — R278 Other lack of coordination: Secondary | ICD-10-CM | POA: Insufficient documentation

## 2022-03-27 DIAGNOSIS — R262 Difficulty in walking, not elsewhere classified: Secondary | ICD-10-CM | POA: Insufficient documentation

## 2022-03-27 DIAGNOSIS — I890 Lymphedema, not elsewhere classified: Secondary | ICD-10-CM | POA: Diagnosis not present

## 2022-03-27 DIAGNOSIS — R2689 Other abnormalities of gait and mobility: Secondary | ICD-10-CM | POA: Diagnosis not present

## 2022-03-27 DIAGNOSIS — R269 Unspecified abnormalities of gait and mobility: Secondary | ICD-10-CM | POA: Diagnosis not present

## 2022-03-27 DIAGNOSIS — R2681 Unsteadiness on feet: Secondary | ICD-10-CM | POA: Diagnosis not present

## 2022-03-27 DIAGNOSIS — M6281 Muscle weakness (generalized): Secondary | ICD-10-CM | POA: Diagnosis not present

## 2022-03-27 NOTE — Therapy (Signed)
OUTPATIENT PHYSICAL THERAPY TREATMENT NOTE   Patient Name: Dustin Gray MRN: [id number] DOB:February 08, 1956, 66 y.o., male 46 Date: 03/27/2022  PCP: [name], MD REFERRING PROVIDER: [name], MD   PT End of Session - 03/27/22 1400     Visit Number 63    Number of Visits 90    Date for PT Re-Evaluation 04/24/22    Authorization Type [employer] Auth 11/28/21-02/22/22 for 24 visits    Authorization Time Period Cert for 9/56/38-75/6/43    Progress Note Due on Visit 80    PT Start Time 1348    PT Stop Time 1430    PT Time Calculation (min) 42 min    Equipment Utilized During Treatment Gait belt    Activity Tolerance Patient tolerated treatment well;No increased pain;Patient limited by fatigue    Behavior During Therapy [facility] for tasks assessed/performed                    Past Medical History:  Diagnosis Date   Abscess    groin   Arthritis    lower spine   Erectile dysfunction    Low testosterone    Lumbar herniated disc    L5   Multiple sclerosis ([place])    Staph aureus infection    Past Surgical History:  Procedure Laterality Date   CARDIOVERSION N/A 11/22/2021   Procedure: CARDIOVERSION;  Surgeon: [name], MD;  Location: [facility];  Service: Cardiovascular;  Laterality: N/A;   COLONOSCOPY WITH PROPOFOL N/A 07/04/2016   Procedure: COLONOSCOPY WITH PROPOFOL;  Surgeon: [name], MD;  Location: [place];  Service: Endoscopy;  Laterality: N/A;   [place]   POLYPECTOMY  07/04/2016   Procedure: POLYPECTOMY;  Surgeon: [name], MD;  Location: [place];  Service: Endoscopy;;   TONSILLECTOMY     Patient Active Problem List   Diagnosis Date Noted   Persistent atrial fibrillation ([place])    Graves disease    Abdominal bloating    Atrial fibrillation with RVR ([place]) 10/01/2019   Hyperthyroidism    Atrial fibrillation with rapid ventricular response ([place]) 09/30/2019   Leg edema     Left leg cellulitis    Special screening for malignant neoplasms, colon    Polyp of sigmoid colon    Benign neoplasm of ascending colon    Rectal polyp    Herniated nucleus pulposus, L5-S1 11/09/2015   Low back pain 10/25/2015   Herniated nucleus pulposus, C5-6 right 10/06/2015   Dysesthesia 09/16/2015   Spasticity 09/16/2015   Unsteady gait 09/16/2015   Multiple sclerosis ([place]) 06/08/2011     REFERRING DIAG: R26.81 (ICD-10-CM) - Unsteadiness on feet   THERAPY DIAG:  Unsteadiness on feet  Difficulty in walking, not elsewhere classified  Abnormality of gait and mobility  Muscle weakness (generalized)  Rationale for Evaluation and Treatment Rehabilitation  PERTINENT HISTORY: Patient presents to physical therapy for unsteadiness, walking instability, fatigue. His PMH includes paroxysmal a fib, MS, lymphedema, hypothyroidism, Graves Disease, arthritis, lumbar herniated disc (L5), depression and neuropathy. Patient first developed symptoms of MS in 2005 and was diagnosed in 2006. Patient has done PT in the past, but hasn't been seen since 2020 at [facility]. Has not been doing his exercises in the past year. Walk outside to garage to ride lawnmower, walks in house. Retired Automotive engineer. Has a rollator at home but doesn't use it in the house   PRECAUTIONS: Fall  SUBJECTIVE:  PAIN:  Are you having pain? No    TODAY'S TREATMENT:   THEREX:    Well zone exercises:  Side step lunge squat (standing in front of mirror - holding 5lb kettlebell ) x 12 reps    Knee ext-  Level 1 x  12 reps Left LE;  Level 2 Right LE x 12 reps Level 2 x 10 reps Left LE:  Level 3 Right LE x 10 reps Level 3 x 8 reps Left LE; level 4 Right LE x 8 reps Level 3 x 6 reps Left  LE; Level 5 Right LE  x 6 reps  Level 3 x 4 reps Left LE; level 6 right LE x 4 reps  Level 3 x 2 reps Left LE; Level 7 right LE x 2 reps   Knee flex:   Level 1 Left LE x 12; Level 2 x 12 with right LE Level 3 Left  LE x 10  reps; Level 3 x 10 reps with right LE Level  3 Left LE x 8 reps; Level 4 x 8 reps with right LE Level 4 left LE x 6 reps ; Level 5 x 6 reps with right LE Level 5 Left LE x 4 reps (partial ROM); Level 6 x 4 reps right LE   Leg press: Seat level increased from 12 in previous visits to 11 today Level 2 right LE x 12 reps; Left LE - level 2- 12 reps Level 3 right LE x 10 reps; Left LE - level 3 - 10 reps Level 4 right LE x 8 reps ; Left LE - Level 4 - 8 reps Level 5 right LE x 6 reps; Left LE- Level 5 - 3 reps   Level 6 right LE x 4 reps; Left LE - Level 5 - 6 reps Level 7 right LE x 2 reps; Right LE - Level 6- 2 reps    Calf press:  Level 2 each LE x 12 reps each Level 3 each LE x 10 reps each Level 4 each LE x 8 reps each Level 5 each LE x 6 reps each  Level 6 each LE x 4 reps  Level 7 each LE x 2 reps     Education provided throughout session via VC/TC and demonstration to facilitate movement at target joints and correct muscle activation for all testing and exercises performed.   PATIENT EDUCATION: Education details: exercise technique, body mechanics Person educated: Patient Education method: Customer service manager, verbal cues Education comprehension: verbalized understanding, returned demonstration    HOME EXERCISE PROGRAM: No changes this session   PT Short Term Goals -       PT SHORT TERM GOAL #1   Title Patient will be independent in home exercise program to improve strength/mobility for better functional independence with ADLs.    Baseline 8/9: HEP to be given next session, 10/31: patient reports compliance with HEP and would like progressions added next session. 08/22/2021=Patient verbalized that he is walking and doing his LE exercises with no questions at this time.    Time 4    Period Weeks    Status Achieved    Target Date 08/16/21              PT Long Term Goals       PT LONG TERM GOAL #1   Title Patient will increase FOTO score to  equal to or greater than 70%  to demonstrate statistically significant improvement in mobility and quality of life.  Baseline 8/9: 64%; risk adjusted 34%; 03/21/21 FOTO: 45; 04/24/21 FOTO: 49% ; 08/22/2021= 56% 4/20: 60% 11/28/21: 63%; 01/04/2022= 60%   Time 12    Period Weeks    Status On-going    Target Date 03/29/2022     PT LONG TERM GOAL #2   Title Patient will increase Berg Balance score by > 6 points to demonstrate decreased fall risk during functional activities.    Baseline 4/20: 43/56 6/6: 48/56; 713/2023= 50/56   Time 12    Period Weeks    Status Achieved   Target Date 03/29/2022     PT LONG TERM GOAL #3   Title Patient will increase 10 meter walk test to >1.35ms as to improve gait speed for better community ambulation and to reduce fall risk.    Baseline 8/9: 0.65 m/s with rollator; 10/31: 0.68 m/s with rollator. 07/06/2021= 0.71 m/s using rollator; 08/22/2021= 0.94 m/s using rollator. 4/20: 1.1 m/s with rollator    Time 12    Period Weeks    Status Achieved    Target Date 10/11/21      PT LONG TERM GOAL #4   Title Patient will increase six minute walk test distance to >1000 for progression to community ambulator and improve gait ability    Baseline 8/9: 505 ft with rollator; 03/21/21: [id number] rollator, 04/24/21: [id number] rollator; 07/06/2021=900 feet; 08/22/2021= 935 feet with use of Rollator 44/20: 940 ft 11/28/21: 1055 feet with rollator    Time 12    Period Weeks    Status Achieved    Target Date 01/04/22      PT LONG TERM GOAL #5   Title Patient will increase glute medius strength on Left LE from 3-/5 to 4/5 to improve stability, gait mechanics, and functional strength.    Baseline 10/31: 3-/5; 07/06/2021= 3-/5; 08/22/2021=3-/5 unable to achieve full ROM yet able to withstand some resistance with testing 4/20: unable to obtainfull ROM against gravity 6/6: unable to move against gravity; 01/04/2022= Continues to have difficulty - unable to raise left LE in sidelye against gravity  but able to perform supine left Hip abd with some resistance= 3-/5; 03/22/2022- Patient able to raise his left LE 3 in off mat today.    Time 12    Period Weeks    Status On-going    Target Date 03/29/2022     PT LONG TERM GOAL #6   Title Patient will increase Left single leg stance time to 15 seconds or greater to increase safety in shower and independence with ADLs.    Baseline 10/31; 1.83 sec without UE support; 08/22/2021=2-3  sec  on left; 6 sec on right 4/20: RLE 10 seconds LLE unable to perform 6/6: R LE 4 seconds during BERG; 01/04/2022= left = 3 sec and right = 8 sec; 02/13/2022= 20 sec on right at best and 4 sec on left LE at best. 03/23/2022=Patient able to stand on left LE at best for 7 sec today.   Time 12    Period Weeks    Status On-going    Target Date 03/29/2022     PT LONG TERM GOAL #7   Title Patient will safely negotiate 13 steps with handrails to safely enter/exit mothers house.    Baseline 4/20: very challenging 6/6: able to copmlete safely even managing dogs per pt report; 01/04/2022- Patient performed 16 steps with B Rail-no difficulty other than fatigue.    Time 12    Period Weeks    Status Achieved  Target Date 01/04/22              Plan - 12/13/21 1340     Clinical Impression Statement Patient with mixed results today- did improve some with some resistance yet other exercises had slight decline in left LE performance. May be due partially to seat position (as in leg press seat was 1 position closer and with seated knee ext and flex- seat may have been pushed back further than normal.)   Pt will continue to benefit from skilled PT interventions to improve his overall strength, mobility and quality of life.    Personal Factors and Comorbidities Age;Comorbidity 3+;Fitness;Past/Current Experience;Time since onset of injury/illness/exacerbation    Comorbidities aroxysmal a fib, MS, lymphedema, hypothyroidism, Graves Disease, arthritis, lumbar herniated disc (L5),  depression and neuropathy    Examination-Activity Limitations Bed Mobility;Bend;Caring for Others;Carry;Reach Overhead;Locomotion Level;Lift;Hygiene/Grooming;Dressing;Squat;Stairs;Stand;Toileting;Transfers    Examination-Participation Restrictions Cleaning;Community Activity;Laundry;Occupation;Shop;Volunteer;Yard Work    Merchant [employer] officer Evolving/Moderate complexity    Rehab Potential Fair    PT Frequency 2x / week    PT Duration 12 weeks    PT Treatment/Interventions ADLs/Self Care Home Management;Aquatic Therapy;Canalith Repostioning;Biofeedback;Cryotherapy;Ultrasound;Traction;Moist Heat;Electrical Stimulation;DME Instruction;Gait training;Therapeutic exercise;Therapeutic activities;Functional mobility training;Stair training;Balance training;Neuromuscular re-education;Patient/family education;Manual techniques;Orthotic Fit/Training;Compression bandaging;Passive range of motion;Vestibular;Taping;Splinting;Energy conservation;Dry needling;Visual/perceptual remediation/compensation    PT Next Visit Plan Continue with progessive balance training, Progressive LE strenthening as appropriate.    PT Home Exercise Plan Access Code: [email or web address] URL: [email or web address]  Added [place] with BTB and encouraged more walking around the home during commerical breaks when watching TV.    Consulted and Agree with Plan of Care Patient              Lewis Moccasin PT 2:34 PM, 03/27/22 Physical Therapist - [place] [phone]     12/13/2021, 1:41 PM

## 2022-03-28 ENCOUNTER — Ambulatory Visit: Payer: Medicare PPO | Attending: Cardiology

## 2022-03-28 DIAGNOSIS — I4819 Other persistent atrial fibrillation: Secondary | ICD-10-CM | POA: Diagnosis not present

## 2022-03-28 DIAGNOSIS — I5022 Chronic systolic (congestive) heart failure: Secondary | ICD-10-CM

## 2022-03-28 DIAGNOSIS — G35 Multiple sclerosis: Secondary | ICD-10-CM

## 2022-03-28 LAB — ECHOCARDIOGRAM COMPLETE
AR max vel: 2.84 cm2
AV Area VTI: 2.94 cm2
AV Area mean vel: 2.78 cm2
AV Mean grad: 8 mmHg
AV Peak grad: 14.1 mmHg
Ao pk vel: 1.88 m/s
Area-P 1/2: 2.67 cm2
S' Lateral: 3.5 cm

## 2022-03-28 MED ORDER — PERFLUTREN LIPID MICROSPHERE
1.0000 mL | INTRAVENOUS | Status: AC | PRN
  Administered 2022-03-28: 2 mL via INTRAVENOUS

## 2022-03-29 ENCOUNTER — Ambulatory Visit: Payer: Medicare PPO

## 2022-03-29 DIAGNOSIS — R2689 Other abnormalities of gait and mobility: Secondary | ICD-10-CM

## 2022-03-29 DIAGNOSIS — M6281 Muscle weakness (generalized): Secondary | ICD-10-CM

## 2022-03-29 DIAGNOSIS — R2681 Unsteadiness on feet: Secondary | ICD-10-CM

## 2022-03-29 DIAGNOSIS — R262 Difficulty in walking, not elsewhere classified: Secondary | ICD-10-CM | POA: Diagnosis not present

## 2022-03-29 DIAGNOSIS — I890 Lymphedema, not elsewhere classified: Secondary | ICD-10-CM | POA: Diagnosis not present

## 2022-03-29 DIAGNOSIS — R278 Other lack of coordination: Secondary | ICD-10-CM

## 2022-03-29 DIAGNOSIS — R269 Unspecified abnormalities of gait and mobility: Secondary | ICD-10-CM

## 2022-03-29 NOTE — Therapy (Signed)
OUTPATIENT PHYSICAL THERAPY TREATMENT NOTE   Patient Name: Dustin Gray MRN: [id number] DOB:August 09, 1955, 66 y.o., male 44 Date: 03/29/2022  PCP: [name], MD REFERRING PROVIDER: [name], MD   PT End of Session - 03/29/22 1444     Visit Number 82    Number of Visits 90    Date for PT Re-Evaluation 04/24/22    Authorization Type [employer] Auth 11/28/21-02/22/22 for 24 visits    Authorization Time Period Cert for 8/34/19-62/2/29    Progress Note Due on Visit 80    PT Start Time 1346    PT Stop Time 1428    PT Time Calculation (min) 42 min    Equipment Utilized During Treatment Gait belt    Activity Tolerance Patient tolerated treatment well;No increased pain;Patient limited by fatigue    Behavior During Therapy [facility] for tasks assessed/performed                    Past Medical History:  Diagnosis Date   Abscess    groin   Arthritis    lower spine   Erectile dysfunction    Low testosterone    Lumbar herniated disc    L5   Multiple sclerosis ([place])    Staph aureus infection    Past Surgical History:  Procedure Laterality Date   CARDIOVERSION N/A 11/22/2021   Procedure: CARDIOVERSION;  Surgeon: [name], MD;  Location: [facility];  Service: Cardiovascular;  Laterality: N/A;   COLONOSCOPY WITH PROPOFOL N/A 07/04/2016   Procedure: COLONOSCOPY WITH PROPOFOL;  Surgeon: [name], MD;  Location: [place];  Service: Endoscopy;  Laterality: N/A;   [place]   POLYPECTOMY  07/04/2016   Procedure: POLYPECTOMY;  Surgeon: [name], MD;  Location: [place];  Service: Endoscopy;;   TONSILLECTOMY     Patient Active Problem List   Diagnosis Date Noted   Persistent atrial fibrillation ([place])    Graves disease    Abdominal bloating    Atrial fibrillation with RVR ([place]) 10/01/2019   Hyperthyroidism    Atrial fibrillation with rapid ventricular response ([place]) 09/30/2019   Leg edema     Left leg cellulitis    Special screening for malignant neoplasms, colon    Polyp of sigmoid colon    Benign neoplasm of ascending colon    Rectal polyp    Herniated nucleus pulposus, L5-S1 11/09/2015   Low back pain 10/25/2015   Herniated nucleus pulposus, C5-6 right 10/06/2015   Dysesthesia 09/16/2015   Spasticity 09/16/2015   Unsteady gait 09/16/2015   Multiple sclerosis ([place]) 06/08/2011     REFERRING DIAG: R26.81 (ICD-10-CM) - Unsteadiness on feet   THERAPY DIAG:  Unsteadiness on feet  Difficulty in walking, not elsewhere classified  Abnormality of gait and mobility  Muscle weakness (generalized)  Rationale for Evaluation and Treatment Rehabilitation  PERTINENT HISTORY: Patient presents to physical therapy for unsteadiness, walking instability, fatigue. His PMH includes paroxysmal a fib, MS, lymphedema, hypothyroidism, Graves Disease, arthritis, lumbar herniated disc (L5), depression and neuropathy. Patient first developed symptoms of MS in 2005 and was diagnosed in 2006. Patient has done PT in the past, but hasn't been seen since 2020 at [facility]. Has not been doing his exercises in the past year. Walk outside to garage to ride lawnmower, walks in house. 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 No    TODAY'S TREATMENT:   THEREX:    Well zone exercises:    Knee ext-  Level 1 x  12 reps Left LE;  Level 2 Right LE x 12 reps Level 2 x 10 reps Left LE:  Level 3 Right LE x 10 reps Level 3 x 8 reps Left LE; level 4 Right LE x 8 reps Level 4 x 6 reps Left  LE; Level 5 Right LE  x 6 reps  Level 4 x 4 reps Left LE; level 6 right LE x 4 reps  Level 4 x 2 reps Left LE; Level 7 right LE x 2 reps   Knee flex:   Level 1 Left LE x 12; Level 2 x 12 with  right LE Level 3 Left LE x 10  reps; Level 3 x 10 reps with right LE Level  3 Left LE x 8 reps; Level 4 x 8 reps with right LE Level 4 left LE x 6 reps ; Level 5 x 6 reps with right LE Level 5 Left LE x 4 reps (partial ROM); Level 6 x 4 reps right LE Level 5 Left LE x 2 reps (partial ROM); Level 7 x 2 reps right LE   Leg press: Seat at level 12  Level 2 right LE x 12 reps; Left LE - level 2- 12 reps Level 3 right LE x 10 reps; Left LE - level 3 - 10 reps Level 4 right LE x 8 reps ; Left LE - Level 4 - 8 reps Level 5 right LE x 6 reps; Left LE- Level 5 - 6 reps   Level 6 right LE x 4 reps; Left LE - Level 6- 4 reps Level 7 right LE x 2 reps; Right LE - Level 6- 2 reps    Calf press:  Level 2 each LE x 12 reps each Level 3 each LE x 10 reps each Level 4 each LE x 8 reps each Level 5 each LE x 6 reps each  Level 6 each LE x 4 reps  Level 7 each LE x 2 reps     Education provided throughout session via VC/TC and demonstration to facilitate movement at target joints and correct muscle activation for all testing and exercises performed.   PATIENT EDUCATION: Education details: exercise technique, body mechanics Person educated: Patient Education method: Customer service manager, verbal cues Education comprehension: verbalized understanding, returned demonstration    HOME EXERCISE PROGRAM: No changes this session   PT Short Term Goals -       PT SHORT TERM GOAL #1   Title Patient will be independent in home exercise program to improve strength/mobility for better functional independence with ADLs.    Baseline 8/9: HEP to be given next session, 10/31: patient reports compliance with HEP and would like progressions added next session. 08/22/2021=Patient verbalized that he is walking and doing his LE exercises with no questions at this time.    Time 4    Period Weeks    Status Achieved    Target Date 08/16/21              PT Long Term Goals       PT LONG TERM GOAL  #1   Title Patient will increase FOTO score to equal to or greater than 70%  to demonstrate statistically significant improvement  in mobility and quality of life.    Baseline 8/9: 64%; risk adjusted 34%; 03/21/21 FOTO: 45; 04/24/21 FOTO: 49% ; 08/22/2021= 56% 4/20: 60% 11/28/21: 63%; 01/04/2022= 60%   Time 12    Period Weeks    Status On-going    Target Date 03/29/2022     PT LONG TERM GOAL #2   Title Patient will increase Berg Balance score by > 6 points to demonstrate decreased fall risk during functional activities.    Baseline 4/20: 43/56 6/6: 48/56; 713/2023= 50/56   Time 12    Period Weeks    Status Achieved   Target Date 03/29/2022     PT LONG TERM GOAL #3   Title Patient will increase 10 meter walk test to >1.36ms as to improve gait speed for better community ambulation and to reduce fall risk.    Baseline 8/9: 0.65 m/s with rollator; 10/31: 0.68 m/s with rollator. 07/06/2021= 0.71 m/s using rollator; 08/22/2021= 0.94 m/s using rollator. 4/20: 1.1 m/s with rollator    Time 12    Period Weeks    Status Achieved    Target Date 10/11/21      PT LONG TERM GOAL #4   Title Patient will increase six minute walk test distance to >1000 for progression to community ambulator and improve gait ability    Baseline 8/9: 505 ft with rollator; 03/21/21: 7743fc rollator, 04/24/21: 75555f rollator; 07/06/2021=900 feet; 08/22/2021= 935 feet with use of Rollator 44/20: 940 ft 11/28/21: 1055 feet with rollator    Time 12    Period Weeks    Status Achieved    Target Date 01/04/22      PT LONG TERM GOAL #5   Title Patient will increase glute medius strength on Left LE from 3-/5 to 4/5 to improve stability, gait mechanics, and functional strength.    Baseline 10/31: 3-/5; 07/06/2021= 3-/5; 08/22/2021=3-/5 unable to achieve full ROM yet able to withstand some resistance with testing 4/20: unable to obtainfull ROM against gravity 6/6: unable to move against gravity; 01/04/2022= Continues to have difficulty -  unable to raise left LE in sidelye against gravity but able to perform supine left Hip abd with some resistance= 3-/5; 03/22/2022- Patient able to raise his left LE 3 in off mat today.    Time 12    Period Weeks    Status On-going    Target Date 03/29/2022     PT LONG TERM GOAL #6   Title Patient will increase Left single leg stance time to 15 seconds or greater to increase safety in shower and independence with ADLs.    Baseline 10/31; 1.83 sec without UE support; 08/22/2021=2-3  sec  on left; 6 sec on right 4/20: RLE 10 seconds LLE unable to perform 6/6: R LE 4 seconds during BERG; 01/04/2022= left = 3 sec and right = 8 sec; 02/13/2022= 20 sec on right at best and 4 sec on left LE at best. 03/23/2022=Patient able to stand on left LE at best for 7 sec today.   Time 12    Period Weeks    Status On-going    Target Date 03/29/2022     PT LONG TERM GOAL #7   Title Patient will safely negotiate 13 steps with handrails to safely enter/exit mothers house.    Baseline 4/20: very challenging 6/6: able to copmlete safely even managing dogs per pt report; 01/04/2022- Patient performed 16 steps with B Rail-no difficulty other than fatigue.    Time 12  Period Weeks    Status Achieved   Target Date 01/04/22              Plan - 12/13/21 1340     Clinical Impression Statement Patient presented with progressing left LE strength as seen by increased resistance with weight training today. He requires less rest and able to change weight and move around the equipment well without significant difficulty.  Pt will continue to benefit from skilled PT interventions to improve his overall strength, mobility and quality of life.    Personal Factors and Comorbidities Age;Comorbidity 3+;Fitness;Past/Current Experience;Time since onset of injury/illness/exacerbation    Comorbidities aroxysmal a fib, MS, lymphedema, hypothyroidism, Graves Disease, arthritis, lumbar herniated disc (L5), depression and neuropathy     Examination-Activity Limitations Bed Mobility;Bend;Caring for Others;Carry;Reach Overhead;Locomotion Level;Lift;Hygiene/Grooming;Dressing;Squat;Stairs;Stand;Toileting;Transfers    Examination-Participation Restrictions Cleaning;Community Activity;Laundry;Occupation;Shop;Volunteer;Yard Work    Merchant [employer] officer Evolving/Moderate complexity    Rehab Potential Fair    PT Frequency 2x / week    PT Duration 12 weeks    PT Treatment/Interventions ADLs/Self Care Home Management;Aquatic Therapy;Canalith Repostioning;Biofeedback;Cryotherapy;Ultrasound;Traction;Moist Heat;Electrical Stimulation;DME Instruction;Gait training;Therapeutic exercise;Therapeutic activities;Functional mobility training;Stair training;Balance training;Neuromuscular re-education;Patient/family education;Manual techniques;Orthotic Fit/Training;Compression bandaging;Passive range of motion;Vestibular;Taping;Splinting;Energy conservation;Dry needling;Visual/perceptual remediation/compensation    PT Next Visit Plan Continue with progessive balance training, Progressive LE strenthening as appropriate.    PT Home Exercise Plan Access Code: [email or web address] URL: [email or web address]  Added Bridges with BTB and encouraged more walking around the home during commerical breaks when watching TV.    Consulted and Agree with Plan of Care Patient              Lewis Moccasin PT 2:51 PM, 03/29/22 Physical Therapist - [place] [phone]     12/13/2021, 1:41 PM

## 2022-04-03 ENCOUNTER — Ambulatory Visit: Payer: Medicare PPO

## 2022-04-03 DIAGNOSIS — M6281 Muscle weakness (generalized): Secondary | ICD-10-CM

## 2022-04-03 DIAGNOSIS — R278 Other lack of coordination: Secondary | ICD-10-CM

## 2022-04-03 DIAGNOSIS — R269 Unspecified abnormalities of gait and mobility: Secondary | ICD-10-CM | POA: Diagnosis not present

## 2022-04-03 DIAGNOSIS — R2689 Other abnormalities of gait and mobility: Secondary | ICD-10-CM | POA: Diagnosis not present

## 2022-04-03 DIAGNOSIS — R262 Difficulty in walking, not elsewhere classified: Secondary | ICD-10-CM

## 2022-04-03 DIAGNOSIS — R2681 Unsteadiness on feet: Secondary | ICD-10-CM

## 2022-04-03 DIAGNOSIS — I890 Lymphedema, not elsewhere classified: Secondary | ICD-10-CM | POA: Diagnosis not present

## 2022-04-03 NOTE — Therapy (Signed)
OUTPATIENT PHYSICAL THERAPY TREATMENT NOTE/RECERTIFICATION   Patient Name: Dustin Gray MRN: [id number] DOB:Apr 14, 1956, 66 y.o., male 48 Date: 04/03/2022  PCP: [name], MD REFERRING PROVIDER: [name], MD   PT End of Session - 04/03/22 1356     Visit Number 19    Number of Visits 107    Date for PT Re-Evaluation 06/26/22    Authorization Type [employer] Auth 11/28/21-02/22/22 for 24 visits    Authorization Time Period Cert for 5/32/99-24/2/68; Recert 34/19/6222- 03/02/9891    Progress Note Due on Visit 90    PT Start Time 1194    PT Stop Time 1429    PT Time Calculation (min) 40 min    Equipment Utilized During Treatment Gait belt    Activity Tolerance Patient tolerated treatment well;No increased pain;Patient limited by fatigue    Behavior During Therapy [facility] for tasks assessed/performed                     Past Medical History:  Diagnosis Date   Abscess    groin   Arthritis    lower spine   Erectile dysfunction    Low testosterone    Lumbar herniated disc    L5   Multiple sclerosis ([place])    Staph aureus infection    Past Surgical History:  Procedure Laterality Date   CARDIOVERSION N/A 11/22/2021   Procedure: CARDIOVERSION;  Surgeon: [name], MD;  Location: [facility];  Service: Cardiovascular;  Laterality: N/A;   COLONOSCOPY WITH PROPOFOL N/A 07/04/2016   Procedure: COLONOSCOPY WITH PROPOFOL;  Surgeon: [name], MD;  Location: [place];  Service: Endoscopy;  Laterality: N/A;   [place]   POLYPECTOMY  07/04/2016   Procedure: POLYPECTOMY;  Surgeon: [name], MD;  Location: [place];  Service: Endoscopy;;   TONSILLECTOMY     Patient Active Problem List   Diagnosis Date Noted   Persistent atrial fibrillation ([place])    Graves disease    Abdominal bloating    Atrial fibrillation with RVR ([place]) 10/01/2019   Hyperthyroidism    Atrial fibrillation with rapid  ventricular response ([place]) 09/30/2019   Leg edema    Left leg cellulitis    Special screening for malignant neoplasms, colon    Polyp of sigmoid colon    Benign neoplasm of ascending colon    Rectal polyp    Herniated nucleus pulposus, L5-S1 11/09/2015   Low back pain 10/25/2015   Herniated nucleus pulposus, C5-6 right 10/06/2015   Dysesthesia 09/16/2015   Spasticity 09/16/2015   Unsteady gait 09/16/2015   Multiple sclerosis ([place]) 06/08/2011     REFERRING DIAG: R26.81 (ICD-10-CM) - Unsteadiness on feet   THERAPY DIAG:  Unsteadiness on feet  Difficulty in walking, not elsewhere classified  Abnormality of gait and mobility  Muscle weakness (generalized)  Rationale for Evaluation and Treatment Rehabilitation  PERTINENT HISTORY: Patient presents to physical therapy for unsteadiness, walking instability, fatigue. His PMH includes paroxysmal a fib, MS, lymphedema, hypothyroidism, Graves Disease, arthritis, lumbar herniated disc (L5), depression and neuropathy. Patient first developed symptoms of MS in 2005 and was diagnosed in 2006. Patient has done PT in the past, but hasn't been seen since 2020 at [facility]. Has not been doing his exercises in the past year. Walk outside to garage to ride lawnmower, walks in house. Retired Automotive engineer. Has a rollator at home but doesn't use it in the house   PRECAUTIONS: Fall  SUBJECTIVE: Patient reports feeling okay today without any major difficulties. States he feels like the strengthening exercises are making a difference.     PAIN:  Are you having pain? No    TODAY'S TREATMENT:   *Reassessed goals for Recert visit today.   THEREX:    Hip abd using Matrix Cable system- 2.5 # 3 sets of 10 reps BLE ( patient with difficulty and reported fatigue) but able to complete today.       Education provided throughout session via VC/TC and demonstration to facilitate movement at target joints and correct muscle activation for all  testing and exercises performed.   PATIENT EDUCATION: Education details: exercise technique, body mechanics Person educated: Patient Education method: Customer service manager, verbal cues Education comprehension: verbalized understanding, returned demonstration    HOME EXERCISE PROGRAM: No changes this session   PT Short Term Goals -       PT SHORT TERM GOAL #1   Title Patient will be independent in home exercise program to improve strength/mobility for better functional independence with ADLs.    Baseline 8/9: HEP to be given next session, 10/31: patient reports compliance with HEP and would like progressions added next session. 08/22/2021=Patient verbalized that he is walking and doing his LE exercises with no questions at this time.    Time 4    Period Weeks    Status Achieved    Target Date 08/16/21              PT Long Term Goals       PT LONG TERM GOAL #1   Title Patient will increase FOTO score to equal to or greater than 70%  to demonstrate statistically significant improvement in mobility and quality of life.    Baseline 8/9: 64%; risk adjusted 34%; 03/21/21 FOTO: 45; 04/24/21 FOTO: 49% ; 08/22/2021= 56% 4/20: 60% 11/28/21: 63%; 01/04/2022= 60%. 04/03/2022= Will reassess next visit   Time 12    Period Weeks    Status On-going    Target Date 06/26/2022     PT LONG TERM GOAL #2   Title Patient will increase Berg Balance score by > 6 points to demonstrate decreased fall risk during functional activities.    Baseline 4/20: 43/56 6/6: 48/56; 713/2023= 50/56   Time 12    Period Weeks    Status Achieved   Target Date 03/29/2022     PT LONG TERM GOAL #3   Title Patient will increase 10 meter walk test to >1.24ms as to improve gait speed for better community ambulation and to reduce fall risk.    Baseline 8/9: 0.65 m/s with rollator; 10/31: 0.68 m/s with rollator. 07/06/2021= 0.71 m/s using rollator; 08/22/2021= 0.94 m/s using rollator. 4/20: 1.1 m/s with rollator     Time 12    Period Weeks    Status Achieved    Target Date 10/11/21      PT LONG TERM GOAL #4   Title Patient will increase six minute walk test distance to >1000 for progression to community ambulator and improve gait ability    Baseline 8/9: 505 ft with rollator; 03/21/21: 7765fc rollator, 04/24/21: 75552f rollator; 07/06/2021=900 feet; 08/22/2021= 935 feet with use of Rollator 44/20: 940 ft 11/28/21: 1055 feet with rollator   Time 12    Period Weeks    Status Achieved    Target Date 01/04/22      PT LONG TERM GOAL #5   Title Patient will increase glute medius strength on Left  LE from able to lift off 3" (sidelye on mat) to > 5" to improve stability, gait mechanics, and functional strength.    Baseline 10/31: 3-/5; 07/06/2021= 3-/5; 08/22/2021=3-/5 unable to achieve full ROM yet able to withstand some resistance with testing 4/20: unable to obtainfull ROM against gravity 6/6: unable to move against gravity; 01/04/2022= Continues to have difficulty - unable to raise left LE in sidelye against gravity but able to perform supine left Hip abd with some resistance= 3-/5; 03/22/2022- Patient able to raise his left LE 3 in off mat today. 04/03/2022= Patient able to raise left LE 3" off mat in sidelye today   Time 12    Period Weeks    Status Revised   Target Date 06/26/2022     PT LONG TERM GOAL #6   Title Patient will increase Left single leg stance time to 15 seconds or greater to increase safety in shower and independence with ADLs.    Baseline 10/31; 1.83 sec without UE support; 08/22/2021=2-3  sec  on left; 6 sec on right 4/20: RLE 10 seconds LLE unable to perform 6/6: R LE 4 seconds during BERG; 01/04/2022= left = 3 sec and right = 8 sec; 02/13/2022= 20 sec on right at best and 4 sec on left LE at best. 03/23/2022=Patient able to stand on left LE at best for 7 sec today. 04/03/2022= 27 sec on right and 12 sec on left   Time 12    Period Weeks    Status On-going    Target Date 06/26/2022     PT LONG  TERM GOAL #7   Title Patient will safely negotiate 13 steps with handrails to safely enter/exit mothers house.    Baseline 4/20: very challenging 6/6: able to copmlete safely even managing dogs per pt report; 01/04/2022- Patient performed 16 steps with B Rail-no difficulty other than fatigue.    Time 12    Period Weeks    Status Achieved   Target Date 01/04/22    PT LONG TERM GOAL #8  Title Patient will increase six minute walk test distance to >1100 ft.  for progression to community ambulator and improve gait ability   Baseline 04/03/2022= 920 feet with 4WW - (Added another goal for endurance to continue to progress community amb distances)   Time 12   Period Weeks   Status INITIAL  Target Date 06/26/2022            Plan - 12/13/21 1340     Clinical Impression Statement Patient presents today with good overall motivation. He continues to work on LE strengthening particularly left LE. He presents with ongoing left hip weakness yet overall stronger during this certification as seen by ability for patient to raise leg off mat as previously unable. He was retested with his endurance and presented with decreased overall functional endurance which will be a point of emphasis during new cert. He continues to progress his balance and able to single leg stand significantly longer than previously. Patient's condition has the potential to improve in response to therapy. Maximum improvement is yet to be obtained. The anticipated improvement is attainable and reasonable in a generally predictable time. Pt will continue to benefit from skilled PT interventions to improve his overall strength, mobility and quality of life.    Personal Factors and Comorbidities Age;Comorbidity 3+;Fitness;Past/Current Experience;Time since onset of injury/illness/exacerbation    Comorbidities aroxysmal a fib, MS, lymphedema, hypothyroidism, Graves Disease, arthritis, lumbar herniated disc (L5), depression and neuropathy  Examination-Activity Limitations Bed Mobility;Bend;Caring for Others;Carry;Reach Overhead;Locomotion Level;Lift;Hygiene/Grooming;Dressing;Squat;Stairs;Stand;Toileting;Transfers    Examination-Participation Restrictions Cleaning;Community Activity;Laundry;Occupation;Shop;Volunteer;Yard Work    Merchant navy officer Evolving/Moderate complexity    Rehab Potential Fair    PT Frequency 2x / week    PT Duration 12 weeks    PT Treatment/Interventions ADLs/Self Care Home Management;Aquatic Therapy;Canalith Repostioning;Biofeedback;Cryotherapy;Ultrasound;Traction;Moist Heat;Electrical Stimulation;DME Instruction;Gait training;Therapeutic exercise;Therapeutic activities;Functional mobility training;Stair training;Balance training;Neuromuscular re-education;Patient/family education;Manual techniques;Orthotic Fit/Training;Compression bandaging;Passive range of motion;Vestibular;Taping;Splinting;Energy conservation;Dry needling;Visual/perceptual remediation/compensation    PT Next Visit Plan Continue with progessive balance training, Progressive LE strenthening as appropriate.    PT Home Exercise Plan Access Code: [email or web address] URL: [email or web address]  Added Bridges with BTB and encouraged more walking around the home during commerical breaks when watching TV.    Consulted and Agree with Plan of Care Patient              Lewis Moccasin PT 4:22 PM, 04/03/22 Physical Therapist - [place] [phone]     12/13/2021, 1:41 PM

## 2022-04-05 ENCOUNTER — Ambulatory Visit: Payer: Medicare PPO

## 2022-04-05 DIAGNOSIS — R269 Unspecified abnormalities of gait and mobility: Secondary | ICD-10-CM | POA: Diagnosis not present

## 2022-04-05 DIAGNOSIS — R262 Difficulty in walking, not elsewhere classified: Secondary | ICD-10-CM | POA: Diagnosis not present

## 2022-04-05 DIAGNOSIS — R2681 Unsteadiness on feet: Secondary | ICD-10-CM | POA: Diagnosis not present

## 2022-04-05 DIAGNOSIS — I890 Lymphedema, not elsewhere classified: Secondary | ICD-10-CM | POA: Diagnosis not present

## 2022-04-05 DIAGNOSIS — R278 Other lack of coordination: Secondary | ICD-10-CM | POA: Diagnosis not present

## 2022-04-05 DIAGNOSIS — M6281 Muscle weakness (generalized): Secondary | ICD-10-CM | POA: Diagnosis not present

## 2022-04-05 DIAGNOSIS — R2689 Other abnormalities of gait and mobility: Secondary | ICD-10-CM

## 2022-04-05 NOTE — Therapy (Signed)
OUTPATIENT PHYSICAL THERAPY TREATMENT NOTE   Patient Name: Dustin Gray MRN: [id number] DOB:Jan 04, 1956, 66 y.o., male Today's Date: 04/07/2022  PCP: [name], MD REFERRING PROVIDER: [name], MD              Past Medical History:  Diagnosis Date   Abscess    groin   Arthritis    lower spine   Erectile dysfunction    Low testosterone    Lumbar herniated disc    L5   Multiple sclerosis ([place])    Staph aureus infection    Past Surgical History:  Procedure Laterality Date   CARDIOVERSION N/A 11/22/2021   Procedure: CARDIOVERSION;  Surgeon: [name], MD;  Location: [facility];  Service: Cardiovascular;  Laterality: N/A;   COLONOSCOPY WITH PROPOFOL N/A 07/04/2016   Procedure: COLONOSCOPY WITH PROPOFOL;  Surgeon: [name], MD;  Location: [facility];  Service: Endoscopy;  Laterality: N/A;   [place]   POLYPECTOMY  07/04/2016   Procedure: POLYPECTOMY;  Surgeon: [name], MD;  Location: [place];  Service: Endoscopy;;   TONSILLECTOMY     Patient Active Problem List   Diagnosis Date Noted   Persistent atrial fibrillation ([place])    Graves disease    Abdominal bloating    Atrial fibrillation with RVR ([place]) 10/01/2019   Hyperthyroidism    Atrial fibrillation with rapid ventricular response ([place]) 09/30/2019   Leg edema    Left leg cellulitis    Special screening for malignant neoplasms, colon    Polyp of sigmoid colon    Benign neoplasm of ascending colon    Rectal polyp    Herniated nucleus pulposus, L5-S1 11/09/2015   Low back pain 10/25/2015   Herniated nucleus pulposus, C5-6 right 10/06/2015   Dysesthesia 09/16/2015   Spasticity 09/16/2015   Unsteady gait 09/16/2015   Multiple sclerosis ([place]) 06/08/2011     REFERRING DIAG: R26.81 (ICD-10-CM) - Unsteadiness on feet   THERAPY DIAG:  Unsteadiness on feet  Difficulty in walking, not elsewhere  classified  Abnormality of gait and mobility  Muscle weakness (generalized)  Rationale for Evaluation and Treatment Rehabilitation  PERTINENT HISTORY: Patient presents to physical therapy for unsteadiness, walking instability, fatigue. His PMH includes paroxysmal a fib, MS, lymphedema, hypothyroidism, Graves Disease, arthritis, lumbar herniated disc (L5), depression and neuropathy. Patient first developed symptoms of MS in 2005 and was diagnosed in 2006. Patient has done PT in the past, but hasn't been seen since 2020 at [facility] Has not been doing his exercises in the past year. Walk outside to garage to ride lawnmower, walks in house. 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    PAIN:  Are you having pain? No    TODAY'S TREATMENT:     THEREX:    Seated LAQ 1 set combo with hip add ball squeeze to facilitate VM x 12 reps  2nd set LAQ without ball squeeze x 12 reps 3rd set LAQ with 3 sec eccentric lowering.  Active Ham curls into seated heel strike (knee ext position)  x 20 rep on left (Patient reports as very difficult Seated hip flexion- AROM x 12 reps x 2 sets   Seated hip flex/abd/Add- up and over bell - 2 sets of 10 reps        Education provided throughout session  via VC/TC and demonstration to facilitate movement at target joints and correct muscle activation for all testing and exercises performed.   PATIENT EDUCATION: Education details: exercise technique, body mechanics Person educated: Patient Education method: Customer service manager, verbal cues Education comprehension: verbalized understanding, returned demonstration    HOME EXERCISE PROGRAM: No changes this session   PT Short Term Goals -       PT SHORT TERM GOAL #1   Title Patient will be independent in home exercise program to improve  strength/mobility for better functional independence with ADLs.    Baseline 8/9: HEP to be given next session, 10/31: patient reports compliance with HEP and would like progressions added next session. 08/22/2021=Patient verbalized that he is walking and doing his LE exercises with no questions at this time.    Time 4    Period Weeks    Status Achieved    Target Date 08/16/21              PT Long Term Goals       PT LONG TERM GOAL #1   Title Patient will increase FOTO score to equal to or greater than 70%  to demonstrate statistically significant improvement in mobility and quality of life.    Baseline 8/9: 64%; risk adjusted 34%; 03/21/21 FOTO: 45; 04/24/21 FOTO: 49% ; 08/22/2021= 56% 4/20: 60% 11/28/21: 63%; 01/04/2022= 60%. 04/03/2022= Will reassess next visit   Time 12    Period Weeks    Status On-going    Target Date 06/26/2022     PT LONG TERM GOAL #2   Title Patient will increase Berg Balance score by > 6 points to demonstrate decreased fall risk during functional activities.    Baseline 4/20: 43/56 6/6: 48/56; 713/2023= 50/56   Time 12    Period Weeks    Status Achieved   Target Date 03/29/2022     PT LONG TERM GOAL #3   Title Patient will increase 10 meter walk test to >1.78ms as to improve gait speed for better community ambulation and to reduce fall risk.    Baseline 8/9: 0.65 m/s with rollator; 10/31: 0.68 m/s with rollator. 07/06/2021= 0.71 m/s using rollator; 08/22/2021= 0.94 m/s using rollator. 4/20: 1.1 m/s with rollator    Time 12    Period Weeks    Status Achieved    Target Date 10/11/21      PT LONG TERM GOAL #4   Title Patient will increase six minute walk test distance to >1000 for progression to community ambulator and improve gait ability    Baseline 8/9: 505 ft with rollator; 03/21/21: [id number] rollator, 04/24/21: 7556f rollator; 07/06/2021=900 feet; 08/22/2021= 935 feet with use of Rollator 44/20: 940 ft 11/28/21: 1055 feet with rollator   Time 12    Period  Weeks    Status Achieved    Target Date 01/04/22      PT LONG TERM GOAL #5   Title Patient will increase glute medius strength on Left LE from able to lift off 3" (sidelye on mat) to > 5" to improve stability, gait mechanics, and functional strength.    Baseline 10/31: 3-/5; 07/06/2021= 3-/5; 08/22/2021=3-/5 unable to achieve full ROM yet able to withstand some resistance with testing 4/20: unable to obtainfull ROM against gravity 6/6: unable to move against gravity; 01/04/2022= Continues to have difficulty - unable to raise left LE in sidelye against gravity but able to perform supine left Hip abd with some resistance= 3-/5; 03/22/2022- Patient able to  raise his left LE 3 in off mat today. 04/03/2022= Patient able to raise left LE 3" off mat in sidelye today   Time 12    Period Weeks    Status Revised   Target Date 06/26/2022     PT LONG TERM GOAL #6   Title Patient will increase Left single leg stance time to 15 seconds or greater to increase safety in shower and independence with ADLs.    Baseline 10/31; 1.83 sec without UE support; 08/22/2021=2-3  sec  on left; 6 sec on right 4/20: RLE 10 seconds LLE unable to perform 6/6: R LE 4 seconds during BERG; 01/04/2022= left = 3 sec and right = 8 sec; 02/13/2022= 20 sec on right at best and 4 sec on left LE at best. 03/23/2022=Patient able to stand on left LE at best for 7 sec today. 04/03/2022= 27 sec on right and 12 sec on left   Time 12    Period Weeks    Status On-going    Target Date 06/26/2022     PT LONG TERM GOAL #7   Title Patient will safely negotiate 13 steps with handrails to safely enter/exit mothers house.    Baseline 4/20: very challenging 6/6: able to copmlete safely even managing dogs per pt report; 01/04/2022- Patient performed 16 steps with B Rail-no difficulty other than fatigue.    Time 12    Period Weeks    Status Achieved   Target Date 01/04/22    PT LONG TERM GOAL #8  Title Patient will increase six minute walk test distance to  >1100 ft.  for progression to community ambulator and improve gait ability   Baseline 04/03/2022= 920 feet with 4WW - (Added another goal for endurance to continue to progress community amb distances)   Time 12   Period Weeks   Status INITIAL  Target Date 06/26/2022            Plan - 12/13/21 1340     Clinical Impression Statement Treatment limited today to seated LE strengthening as patient was feeling weak. He required rest break with exercises yet strongly motivated to push his left LE as able. Significant Left LE weakness yet able to complete reps/sets with increased time. Pt will continue to benefit from skilled PT interventions to improve his overall strength, mobility and quality of life.    Personal Factors and Comorbidities Age;Comorbidity 3+;Fitness;Past/Current Experience;Time since onset of injury/illness/exacerbation    Comorbidities aroxysmal a fib, MS, lymphedema, hypothyroidism, Graves Disease, arthritis, lumbar herniated disc (L5), depression and neuropathy    Examination-Activity Limitations Bed Mobility;Bend;Caring for Others;Carry;Reach Overhead;Locomotion Level;Lift;Hygiene/Grooming;Dressing;Squat;Stairs;Stand;Toileting;Transfers    Examination-Participation Restrictions Cleaning;Community Activity;Laundry;Occupation;Shop;Volunteer;Yard Work    Merchant [employer] officer Evolving/Moderate complexity    Rehab Potential Fair    PT Frequency 2x / week    PT Duration 12 weeks    PT Treatment/Interventions ADLs/Self Care Home Management;Aquatic Therapy;Canalith Repostioning;Biofeedback;Cryotherapy;Ultrasound;Traction;Moist Heat;Electrical Stimulation;DME Instruction;Gait training;Therapeutic exercise;Therapeutic activities;Functional mobility training;Stair training;Balance training;Neuromuscular re-education;Patient/family education;Manual techniques;Orthotic Fit/Training;Compression bandaging;Passive range of motion;Vestibular;Taping;Splinting;Energy conservation;Dry  needling;Visual/perceptual remediation/compensation    PT Next Visit Plan Continue with progessive balance training, Progressive LE strenthening as appropriate.    PT Home Exercise Plan Access Code: [email or web address] URL: [email or web address]  Added Bridges with BTB and encouraged more walking around the home during commerical breaks when watching TV.    Consulted and Agree with Plan of Care Patient              Lewis Moccasin PT 7:41 AM, 04/07/22 Physical Therapist - Larence Penning  [place] [phone]     12/13/2021, 1:41 PM

## 2022-04-10 ENCOUNTER — Ambulatory Visit: Payer: Medicare PPO

## 2022-04-10 DIAGNOSIS — M6281 Muscle weakness (generalized): Secondary | ICD-10-CM

## 2022-04-10 DIAGNOSIS — R278 Other lack of coordination: Secondary | ICD-10-CM | POA: Diagnosis not present

## 2022-04-10 DIAGNOSIS — R262 Difficulty in walking, not elsewhere classified: Secondary | ICD-10-CM

## 2022-04-10 DIAGNOSIS — R2681 Unsteadiness on feet: Secondary | ICD-10-CM | POA: Diagnosis not present

## 2022-04-10 DIAGNOSIS — R2689 Other abnormalities of gait and mobility: Secondary | ICD-10-CM

## 2022-04-10 DIAGNOSIS — R269 Unspecified abnormalities of gait and mobility: Secondary | ICD-10-CM | POA: Diagnosis not present

## 2022-04-10 DIAGNOSIS — I890 Lymphedema, not elsewhere classified: Secondary | ICD-10-CM | POA: Diagnosis not present

## 2022-04-10 NOTE — Therapy (Signed)
OUTPATIENT PHYSICAL THERAPY TREATMENT NOTE   Patient Name: Dustin Gray MRN: [id number] DOB:1955-08-24, 66 y.o., male 16 Date: 04/11/2022  PCP: [name], MD REFERRING PROVIDER: [name], MD   PT End of Session - 04/10/22 1440     Visit Number 25    Number of Visits 107    Date for PT Re-Evaluation 06/26/22    Authorization Type [employer] Auth 11/28/21-02/22/22 for 24 visits    Authorization Time Period Cert for 0/63/01-60/1/09; Recert 32/35/5732- 2/0/2542    Progress Note Due on Visit 90    PT Start Time 1345    PT Stop Time 1428    PT Time Calculation (min) 43 min    Equipment Utilized During Treatment Gait belt    Activity Tolerance Patient tolerated treatment well;No increased pain;Patient limited by fatigue    Behavior During Therapy [facility] for tasks assessed/performed                       Past Medical History:  Diagnosis Date   Abscess    groin   Arthritis    lower spine   Erectile dysfunction    Low testosterone    Lumbar herniated disc    L5   Multiple sclerosis ([place])    Staph aureus infection    Past Surgical History:  Procedure Laterality Date   CARDIOVERSION N/A 11/22/2021   Procedure: CARDIOVERSION;  Surgeon: [name], MD;  Location: [facility];  Service: Cardiovascular;  Laterality: N/A;   COLONOSCOPY WITH PROPOFOL N/A 07/04/2016   Procedure: COLONOSCOPY WITH PROPOFOL;  Surgeon: [name], MD;  Location: [place];  Service: Endoscopy;  Laterality: N/A;   [place]   POLYPECTOMY  07/04/2016   Procedure: POLYPECTOMY;  Surgeon: [name], MD;  Location: [place];  Service: Endoscopy;;   TONSILLECTOMY     Patient Active Problem List   Diagnosis Date Noted   Persistent atrial fibrillation ([place])    Graves disease    Abdominal bloating    Atrial fibrillation with RVR ([place]) 10/01/2019   Hyperthyroidism    Atrial fibrillation with rapid ventricular  response ([place]) 09/30/2019   Leg edema    Left leg cellulitis    Special screening for malignant neoplasms, colon    Polyp of sigmoid colon    Benign neoplasm of ascending colon    Rectal polyp    Herniated nucleus pulposus, L5-S1 11/09/2015   Low back pain 10/25/2015   Herniated nucleus pulposus, C5-6 right 10/06/2015   Dysesthesia 09/16/2015   Spasticity 09/16/2015   Unsteady gait 09/16/2015   Multiple sclerosis ([place]) 06/08/2011     REFERRING DIAG: R26.81 (ICD-10-CM) - Unsteadiness on feet   THERAPY DIAG:  Unsteadiness on feet  Difficulty in walking, not elsewhere classified  Abnormality of gait and mobility  Muscle weakness (generalized)  Rationale for Evaluation and Treatment Rehabilitation  PERTINENT HISTORY: Patient presents to physical therapy for unsteadiness, walking instability, fatigue. His PMH includes paroxysmal a fib, MS, lymphedema, hypothyroidism, Graves Disease, arthritis, lumbar herniated disc (L5), depression and neuropathy. Patient first developed symptoms of MS in 2005 and was diagnosed in 2006. Patient has done PT in the past, but hasn't been seen since 2020 at [facility]. Has not been doing his exercises in the past year. Walk outside to garage to ride lawnmower, walks in house. Retired Automotive engineer. Has a rollator at home but doesn't use it in the house   PRECAUTIONS:  Fall  SUBJECTIVE: Patient reports feeling tired with increased LE muscle weakness today.    PAIN:  Are you having pain? No    TODAY'S TREATMENT:     THEREX:    Well zone exercises:  Leg press: Seat at level 12   Level 2 right LE x 12 reps; Left LE - level 2- 12 reps Level 3 right LE x 10 reps; Left LE - level 3 - 10 reps Level 4 right LE x 8 reps ; Left LE - Level 4 - 8 reps Level 5 right LE x 6 reps; Left LE- Level 5 - 6 reps   Level 6 right LE x 4 reps; Left LE - Level 6- 4 reps Level 7 right LE x 2 reps; Right LE - Level 7- 2 reps (max effort)     Calf press:   Level 2 each LE x 12 reps each Level 3 each LE x 10 reps each Level 4 each LE x 8 reps each Level 5 each LE x 6 reps each  Level 6 each LE x 4 reps  Level 7 each LE x 2 reps      Knee ext-  Level 1 x  12 reps Left LE;  Level 2 Right LE x 12 reps Level 2 x 10 reps Left LE:  Level 3 Right LE x 10 reps Level 3 x 8 reps Left LE; level 4 Right LE x 8 reps Level 4 x 6 reps Left  LE; Level 5 Right LE  x 6 reps  Level 4 x 4 reps Left LE; level 4 right LE x 4 reps  Level 4 x 2 reps Left LE; Level 4 right LE x 2 reps   Knee flex:   Level 1 Left LE x 12; Level 2 x 12 with right LE Level 3 Left LE x 10  reps; Level 3 x 10 reps with right LE Level  3 Left LE x 8 reps; Level 4 x 8 reps with right LE Level 4 left LE x 6 reps ; Level 5 x 6 reps with right LE Level 5 Left LE x 4 reps (partial ROM); Level 6 x 4 reps right LE Level 4 Left LE x 2 reps (partial ROM); Level 7 x 2 reps right LE        Education provided throughout session via VC/TC and demonstration to facilitate movement at target joints and correct muscle activation for all testing and exercises performed.   PATIENT EDUCATION: Education details: exercise technique, body mechanics Person educated: Patient Education method: Customer service manager, verbal cues Education comprehension: verbalized understanding, returned demonstration    HOME EXERCISE PROGRAM: No changes this session   PT Short Term Goals -       PT SHORT TERM GOAL #1   Title Patient will be independent in home exercise program to improve strength/mobility for better functional independence with ADLs.    Baseline 8/9: HEP to be given next session, 10/31: patient reports compliance with HEP and would like progressions added next session. 08/22/2021=Patient verbalized that he is walking and doing his LE exercises with no questions at this time.    Time 4    Period Weeks    Status Achieved    Target Date 08/16/21              PT Long Term Goals        PT LONG TERM GOAL #1   Title Patient will increase FOTO score to equal to  or greater than 70%  to demonstrate statistically significant improvement in mobility and quality of life.    Baseline 8/9: 64%; risk adjusted 34%; 03/21/21 FOTO: 45; 04/24/21 FOTO: 49% ; 08/22/2021= 56% 4/20: 60% 11/28/21: 63%; 01/04/2022= 60%. 04/03/2022= Will reassess next visit   Time 12    Period Weeks    Status On-going    Target Date 06/26/2022     PT LONG TERM GOAL #2   Title Patient will increase Berg Balance score by > 6 points to demonstrate decreased fall risk during functional activities.    Baseline 4/20: 43/56 6/6: 48/56; 713/2023= 50/56   Time 12    Period Weeks    Status Achieved   Target Date 03/29/2022     PT LONG TERM GOAL #3   Title Patient will increase 10 meter walk test to >1.7ms as to improve gait speed for better community ambulation and to reduce fall risk.    Baseline 8/9: 0.65 m/s with rollator; 10/31: 0.68 m/s with rollator. 07/06/2021= 0.71 m/s using rollator; 08/22/2021= 0.94 m/s using rollator. 4/20: 1.1 m/s with rollator    Time 12    Period Weeks    Status Achieved    Target Date 10/11/21      PT LONG TERM GOAL #4   Title Patient will increase six minute walk test distance to >1000 for progression to community ambulator and improve gait ability    Baseline 8/9: 505 ft with rollator; 03/21/21: 7796fc rollator, 04/24/21: [id number] rollator; 07/06/2021=900 feet; 08/22/2021= 935 feet with use of Rollator 44/20: 940 ft 11/28/21: 1055 feet with rollator   Time 12    Period Weeks    Status Achieved    Target Date 01/04/22      PT LONG TERM GOAL #5   Title Patient will increase glute medius strength on Left LE from able to lift off 3" (sidelye on mat) to > 5" to improve stability, gait mechanics, and functional strength.    Baseline 10/31: 3-/5; 07/06/2021= 3-/5; 08/22/2021=3-/5 unable to achieve full ROM yet able to withstand some resistance with testing 4/20: unable to obtainfull ROM  against gravity 6/6: unable to move against gravity; 01/04/2022= Continues to have difficulty - unable to raise left LE in sidelye against gravity but able to perform supine left Hip abd with some resistance= 3-/5; 03/22/2022- Patient able to raise his left LE 3 in off mat today. 04/03/2022= Patient able to raise left LE 3" off mat in sidelye today   Time 12    Period Weeks    Status Revised   Target Date 06/26/2022     PT LONG TERM GOAL #6   Title Patient will increase Left single leg stance time to 15 seconds or greater to increase safety in shower and independence with ADLs.    Baseline 10/31; 1.83 sec without UE support; 08/22/2021=2-3  sec  on left; 6 sec on right 4/20: RLE 10 seconds LLE unable to perform 6/6: R LE 4 seconds during BERG; 01/04/2022= left = 3 sec and right = 8 sec; 02/13/2022= 20 sec on right at best and 4 sec on left LE at best. 03/23/2022=Patient able to stand on left LE at best for 7 sec today. 04/03/2022= 27 sec on right and 12 sec on left   Time 12    Period Weeks    Status On-going    Target Date 06/26/2022     PT LONG TERM GOAL #7   Title Patient will safely negotiate 13 steps  with handrails to safely enter/exit mothers house.    Baseline 4/20: very challenging 6/6: able to copmlete safely even managing dogs per pt report; 01/04/2022- Patient performed 16 steps with B Rail-no difficulty other than fatigue.    Time 12    Period Weeks    Status Achieved   Target Date 01/04/22    PT LONG TERM GOAL #8  Title Patient will increase six minute walk test distance to >1100 ft.  for progression to community ambulator and improve gait ability   Baseline 04/03/2022= 920 feet with 4WW - (Added another goal for endurance to continue to progress community amb distances)   Time 12   Period Weeks   Status INITIAL  Target Date 06/26/2022            Plan - 12/13/21 1340     Clinical Impression Statement Patient presented with good motivation for today's session. He presented  with continued progress with LE strengthening and able to achieve max with leg press indicating improving LE strength. Pt will continue to benefit from skilled PT interventions to improve his overall strength, mobility and quality of life.    Personal Factors and Comorbidities Age;Comorbidity 3+;Fitness;Past/Current Experience;Time since onset of injury/illness/exacerbation    Comorbidities aroxysmal a fib, MS, lymphedema, hypothyroidism, Graves Disease, arthritis, lumbar herniated disc (L5), depression and neuropathy    Examination-Activity Limitations Bed Mobility;Bend;Caring for Others;Carry;Reach Overhead;Locomotion Level;Lift;Hygiene/Grooming;Dressing;Squat;Stairs;Stand;Toileting;Transfers    Examination-Participation Restrictions Cleaning;Community Activity;Laundry;Occupation;Shop;Volunteer;Yard Work    Merchant [employer] officer Evolving/Moderate complexity    Rehab Potential Fair    PT Frequency 2x / week    PT Duration 12 weeks    PT Treatment/Interventions ADLs/Self Care Home Management;Aquatic Therapy;Canalith Repostioning;Biofeedback;Cryotherapy;Ultrasound;Traction;Moist Heat;Electrical Stimulation;DME Instruction;Gait training;Therapeutic exercise;Therapeutic activities;Functional mobility training;Stair training;Balance training;Neuromuscular re-education;Patient/family education;Manual techniques;Orthotic Fit/Training;Compression bandaging;Passive range of motion;Vestibular;Taping;Splinting;Energy conservation;Dry needling;Visual/perceptual remediation/compensation    PT Next Visit Plan Continue with progessive balance training, Progressive LE strenthening as appropriate.    PT Home Exercise Plan Access Code: [email or web address] URL: [email or web address]  Added Bridges with BTB and encouraged more walking around the home during commerical breaks when watching TV.    Consulted and Agree with Plan of Care Patient              Lewis Moccasin PT 12:49 PM,  04/11/22 Physical Therapist - [place] [phone]     12/13/2021, 1:41 PM

## 2022-04-12 ENCOUNTER — Ambulatory Visit: Payer: Medicare PPO

## 2022-04-12 ENCOUNTER — Ambulatory Visit: Payer: Medicare PPO | Admitting: Occupational Therapy

## 2022-04-12 DIAGNOSIS — R2681 Unsteadiness on feet: Secondary | ICD-10-CM | POA: Diagnosis not present

## 2022-04-12 DIAGNOSIS — R262 Difficulty in walking, not elsewhere classified: Secondary | ICD-10-CM | POA: Diagnosis not present

## 2022-04-12 DIAGNOSIS — M6281 Muscle weakness (generalized): Secondary | ICD-10-CM

## 2022-04-12 DIAGNOSIS — I890 Lymphedema, not elsewhere classified: Secondary | ICD-10-CM | POA: Diagnosis not present

## 2022-04-12 DIAGNOSIS — R269 Unspecified abnormalities of gait and mobility: Secondary | ICD-10-CM

## 2022-04-12 DIAGNOSIS — R2689 Other abnormalities of gait and mobility: Secondary | ICD-10-CM | POA: Diagnosis not present

## 2022-04-12 DIAGNOSIS — R278 Other lack of coordination: Secondary | ICD-10-CM | POA: Diagnosis not present

## 2022-04-12 NOTE — Patient Instructions (Signed)

## 2022-04-12 NOTE — Therapy (Signed)
Dustin Gray MAIN [facility] SERVICES [address] [place], [place], [address] Phone: [phone]   Fax:  [phone]  Occupational Therapy Treatment  Patient Details  Name: Dustin Gray MRN: [id number] Date of Birth: 11-Feb-1956 Referring Provider (OT): Staci Acosta, MD   Encounter Date: 04/12/2022   OT End of Session - 04/12/22 1503     Visit Number 19    Number of Visits 36    Date for OT Re-Evaluation 05/30/22    OT Start Time 0300    OT Stop Time 0340    OT Time Calculation (min) 40 min    Activity Tolerance Patient tolerated treatment well;No increased pain    Behavior During Therapy WFL for tasks assessed/performed             Past Medical History:  Diagnosis Date   Abscess    groin   Arthritis    lower spine   Erectile dysfunction    Low testosterone    Lumbar herniated disc    L5   Multiple sclerosis ([place])    Staph aureus infection     Past Surgical History:  Procedure Laterality Date   CARDIOVERSION N/A 11/22/2021   Procedure: CARDIOVERSION;  Surgeon: [name], MD;  Location: [facility];  Service: Cardiovascular;  Laterality: N/A;   COLONOSCOPY WITH PROPOFOL N/A 07/04/2016   Procedure: COLONOSCOPY WITH PROPOFOL;  Surgeon: [name], MD;  Location: [place];  Service: Endoscopy;  Laterality: N/A;   [place]   POLYPECTOMY  07/04/2016   Procedure: POLYPECTOMY;  Surgeon: [name], MD;  Location: [place];  Service: Endoscopy;;   TONSILLECTOMY      There were no vitals filed for this visit.   Subjective Assessment - 04/12/22 1513     Subjective  Mr. Dustin Gray returns to OT for follow up support on lymphedema self management. Pt denies LE related leg pain. Pt's custom compression knee highs arrived today and are ready for fitting. These garments are remakes for initial fitting, which lacked silicone top bands as  initially requested.    Pertinent  History relevant to LE: HTN, MS, Herniated C-5-C6, L5-S1, chronic low back pain, persistent Afib,Hx LLE cellulitis, Graves disease, HYPERthyroidism, OA    Limitations difficulty walking, impaired functional mobility and transfers, impaired balance, muscle weakness, L>R,, altered sensation, chronic back pain , chronic leg swelling and associated pain, spasticity in legs    Repetition Increases Symptoms    Special Tests +Stemmer sign, L>R; Intake FOTO: 51/100    Patient Stated Goals Learn about lymphedema and explore treatment options    Pain Onset Yesterday    Pain Onset More than a month ago                          OT Treatments/Exercises (OP) - 04/12/22 1558       ADLs   ADL Education Given Yes      Manual Therapy   Manual Therapy Edema management    Edema Management fitting for replac61mnts remakes                    OT Education - 04/12/22 1559     Education Details Pt edu for donning and doffing compression garments with and without assistive devices    Person(s) Educated Patient    Methods Explanation;Demonstration;Handout    Comprehension Verbalized understanding;Returned demonstration  OT Long Term Goals - 11/02/21 1028       OT LONG TERM GOAL #1   Title Given this patient's Intake score of 51/100 on the functional outcomes FOTO tool, patient will experience an increase in function of 5 points to improve basic and instrumental ADLs performance, including lymphedema self-care.    Baseline Max A    Time 12    Period Weeks    Status Achieved   08/08/21 ( OT visit 9) increased 2 points tfrom 51/100 initially to 53100 today. 11/02/21: Goal met with final score 77/100%, a 26 point increase. Pt is also simultaneously undergoing PT, whoich has helped lymphatic function immensely.   Target Date 01/07/22      OT LONG TERM GOAL #2   Title Pt will be able to apply knee length, multi-layer, short stretch compression wraps to one limb  at a time using gradient techniques with MAX CG ASSISTANCE to decrease limb volume, to limit infection risk, and to limit lymphedema progression.    Baseline Dependent    Time 4    Period Days    Status Achieved   Pt met and exceeded this goal as he is able to wrap independently     OT LONG TERM GOAL #3   Title Pt will demonstrate understanding of lymphedema prevention strategies by identifying and discussing 5 precautions using printed reference (modified assistance) to reduce risk of progression and to limit infection risk.    Baseline Max A    Time 4    Period Days    Status Achieved    Target Date --   4th OT Rx visit     OT LONG TERM GOAL #4   Title Pt will achieve at least a 10% limb volume reduction in bilateral legs to return limb to normal size and shape,  to limit lymphedema progression and to limit infection risk.    Baseline mAx    Time 12    Period Weeks    Status Achieved   LLE volume reduced by 18.4%. RLE volume reduced by 11.7%.   Target Date 01/07/22      OT LONG TERM GOAL #5   Title With MAX CG ASSISTANCE Pt will achieve and sustain a least 85% compliance with all 4 LE self-care home program components throughout Intensive Phase CDT, including modified simple self-MLD, daily skin inspection and care, lymphatic pumping the ex, 23/7 compression wraps to sustain clinical gains made in CDT and to limit lymphedema progression and further functional decline.    Baseline Dependent    Time 12    Period Weeks    Status Achieved   Met and exceeded goal. Pt modified independent (extra time) with all home program components and consistently 85% compliant w LE self care                  Plan - 04/12/22 1559     Clinical Impression Statement RLE compression stocking fits well, has correct silicone top band and has closed toe as requested. LLE stocking fits well, has correct silicone band, but has open toe instead of closed toe. We need LLE garment remake to correct  toe  config. Pt will wash and wear for a few days, then let me know on Tuesday next week if anyother adjust46mnts are needed. Cont as per POC.    OT Occupational Profile and History Comprehensive Assessment- Review of records and extensive additional review of physical, cognitive, psychosocial history related to current functional performance  Occupational performance deficits (Please refer to evaluation for details): ADL's;IADL's;Work;Leisure;Social Participation;Other   role performance   Body Structure / Function / Physical Skills ADL;Edema;Skin integrity;Flexibility;Pain;ROM;Decreased knowledge of use of DME;Scar mobility;IADL    Rehab Potential Good    Clinical Decision Making Several treatment options, min-mod task modification necessary    Comorbidities Affecting Occupational Performance: Presence of comorbidities impacting occupational performance    Modification or Assistance to Complete Evaluation  Min-Moderate modification of tasks or assist with assess necessary to complete eval    OT Frequency 2x / week    OT Duration 12 weeks   and PRN for follow along   OT Treatment/Interventions Self-care/ADL training;Therapeutic exercise;Manual Therapy;Coping strategies training;Therapeutic activities;Manual lymph drainage;DME and/or AE instruction;Compression bandaging;Other (comment);Patient/family education   skin care to limit infection risk   Plan Complete Decongestive Therapy (CDT) One leg at a time to limit fall LLE first. Manual lymphatic drainage (MLD), skin care, ther ex, compression wraps, then    OT Home Exercise Plan Pt verbalized understanding that he requires assistance with all lymphedema home care components, especially compression wrapping, between visits for optimal prognosis. Without assistance prognosis is poor    Recommended Other Services In insurance benefits available, garmentsConsider advanced sequential pneumatic compression device (Flexitouch) to maximize independence w  lymphedema self-care at home over time. Pt unable to perform simple self-mld    Consulted and Agree with Plan of Care Patient             Patient will benefit from skilled therapeutic intervention in order to improve the following deficits and impairments:   Body Structure / Function / Physical Skills: ADL, Edema, Skin integrity, Flexibility, Pain, ROM, Decreased knowledge of use of DME, Scar mobility, IADL       Visit Diagnosis: Lymphedema, not elsewhere classified    Problem List Patient Active Problem List   Diagnosis Date Noted   Persistent atrial fibrillation (HCC)    Graves disease    Abdominal bloating    Atrial fibrillation with RVR ([place]) 10/01/2019   Hyperthyroidism    Atrial fibrillation with rapid ventricular response ([place]) 09/30/2019   Leg edema    Left leg cellulitis    Special screening for malignant neoplasms, colon    Polyp of sigmoid colon    Benign neoplasm of ascending colon    Rectal polyp    Herniated nucleus pulposus, L5-S1 11/09/2015   Low back pain 10/25/2015   Herniated nucleus pulposus, C5-6 right 10/06/2015   Dysesthesia 09/16/2015   Spasticity 09/16/2015   Unsteady gait 09/16/2015   Multiple sclerosis ([place]) 06/08/2011    Andrey Spearman, MS, OTR/L, CLT-LANA 04/12/22 4:03 PM   [facility] MAIN [facility] SERVICES [address] [place], [place], [address] Phone: [phone]   Fax:  [phone]  Name: Dustin Gray MRN: [id number] Date of Birth: 02-09-1956

## 2022-04-12 NOTE — Therapy (Signed)
OUTPATIENT PHYSICAL THERAPY TREATMENT NOTE   Patient Name: Dustin Gray MRN: [id number] DOB:1955-10-30, 66 y.o., male 75 Date: 04/12/2022  PCP: [name], MD REFERRING PROVIDER: [name], MD   PT End of Session - 04/12/22 1406     Visit Number 47    Number of Visits 107    Date for PT Re-Evaluation 06/26/22    Authorization Type [employer] Auth 11/28/21-02/22/22 for 24 visits    Authorization Time Period Cert for 0/73/71-11/25/67; Recert 48/54/6270- 08/28/91    Progress Note Due on Visit 90    PT Start Time 1346    PT Stop Time 1429    PT Time Calculation (min) 43 min    Equipment Utilized During Treatment Gait belt    Activity Tolerance Patient tolerated treatment well;No increased pain;Patient limited by fatigue    Behavior During Therapy [facility] for tasks assessed/performed                       Past Medical History:  Diagnosis Date   Abscess    groin   Arthritis    lower spine   Erectile dysfunction    Low testosterone    Lumbar herniated disc    L5   Multiple sclerosis ([place])    Staph aureus infection    Past Surgical History:  Procedure Laterality Date   CARDIOVERSION N/A 11/22/2021   Procedure: CARDIOVERSION;  Surgeon: [name], MD;  Location: [facility];  Service: Cardiovascular;  Laterality: N/A;   COLONOSCOPY WITH PROPOFOL N/A 07/04/2016   Procedure: COLONOSCOPY WITH PROPOFOL;  Surgeon: [name], MD;  Location: [place];  Service: Endoscopy;  Laterality: N/A;   [place]   POLYPECTOMY  07/04/2016   Procedure: POLYPECTOMY;  Surgeon: [name], MD;  Location: [place];  Service: Endoscopy;;   TONSILLECTOMY     Patient Active Problem List   Diagnosis Date Noted   Persistent atrial fibrillation ([place])    Graves disease    Abdominal bloating    Atrial fibrillation with RVR ([place]) 10/01/2019   Hyperthyroidism    Atrial fibrillation with rapid ventricular  response ([place]) 09/30/2019   Leg edema    Left leg cellulitis    Special screening for malignant neoplasms, colon    Polyp of sigmoid colon    Benign neoplasm of ascending colon    Rectal polyp    Herniated nucleus pulposus, L5-S1 11/09/2015   Low back pain 10/25/2015   Herniated nucleus pulposus, C5-6 right 10/06/2015   Dysesthesia 09/16/2015   Spasticity 09/16/2015   Unsteady gait 09/16/2015   Multiple sclerosis ([place]) 06/08/2011     REFERRING DIAG: R26.81 (ICD-10-CM) - Unsteadiness on feet   THERAPY DIAG:  Unsteadiness on feet  Difficulty in walking, not elsewhere classified  Abnormality of gait and mobility  Muscle weakness (generalized)  Rationale for Evaluation and Treatment Rehabilitation  PERTINENT HISTORY: Patient presents to physical therapy for unsteadiness, walking instability, fatigue. His PMH includes paroxysmal a fib, MS, lymphedema, hypothyroidism, Graves Disease, arthritis, lumbar herniated disc (L5), depression and neuropathy. Patient first developed symptoms of MS in 2005 and was diagnosed in 2006. Patient has done PT in the past, but hasn't been seen since 2020 at [facility]. Has not been doing his exercises in the past year. Walk outside to garage to ride lawnmower, walks in house. Retired Automotive engineer. Has a rollator at home but doesn't use it in the house   PRECAUTIONS:  Fall  SUBJECTIVE: Patient reports doing well overall and no new issues. Denies any falls.    PAIN:  Are you having pain? No    TODAY'S TREATMENT:     THEREX:    Well zone exercises:  Leg press: Seat at level 12   Level 2 right LE x 12 reps; Left LE - level 2- 12 reps Level 5 right LE x 6 reps; Left LE- Level 5 - 6 reps  x 4 sets  Level 7 right LE x 2 reps; Right LE - Level 7- 2 reps     Calf press:  Level 2 each LE x 12 reps each Level 3 each LE x 10 reps each Level 4 each LE x 8 reps each Level 5 each LE x 6 reps each  Level 6 each LE x 4 reps  Level 7 right LE  x 2 reps; Level 4 right LE x 2 reps      Knee ext-  Level 1 x  12 reps Left LE;  Level 2 Right LE x 12 reps Level 2 x 10 reps Left LE:  Level 3 Right LE x 10 reps Level 3 x 8 reps Left LE; level 4 Right LE x 8 reps Level 4 x 6 reps Left  LE; Level 5 Right LE  x 6 reps  Level 4 x 4 reps Left LE; level 4 right LE x 4 reps  Level 4 x 2 reps Left LE; Level 4 right LE x 2 reps   Knee flex:   Level 1 Left LE x 12; Level 2 x 12 with right LE Level 3 Left LE x 10  reps; Level 3 x 10 reps with right LE Level  3 Left LE x 8 reps; Level 4 x 8 reps with right LE Level 4 left LE x 6 reps ; Level 5 x 6 reps with right LE Level 5 Left LE x 4 reps (partial ROM); Level 6 x 4 reps right LE Level 5 Left LE x 2 reps (partial ROM); Level 7 x 2 reps right LE        Education provided throughout session via VC/TC and demonstration to facilitate movement at target joints and correct muscle activation for all testing and exercises performed.   PATIENT EDUCATION: Education details: exercise technique, body mechanics Person educated: Patient Education method: Customer service manager, verbal cues Education comprehension: verbalized understanding, returned demonstration    HOME EXERCISE PROGRAM: No changes this session   PT Short Term Goals -       PT SHORT TERM GOAL #1   Title Patient will be independent in home exercise program to improve strength/mobility for better functional independence with ADLs.    Baseline 8/9: HEP to be given next session, 10/31: patient reports compliance with HEP and would like progressions added next session. 08/22/2021=Patient verbalized that he is walking and doing his LE exercises with no questions at this time.    Time 4    Period Weeks    Status Achieved    Target Date 08/16/21              PT Long Term Goals       PT LONG TERM GOAL #1   Title Patient will increase FOTO score to equal to or greater than 70%  to demonstrate statistically significant  improvement in mobility and quality of life.    Baseline 8/9: 64%; risk adjusted 34%; 03/21/21 FOTO: 45; 04/24/21 FOTO: 49% ; 08/22/2021= 56% 4/20:  60% 11/28/21: 63%; 01/04/2022= 60%. 04/03/2022= Will reassess next visit   Time 12    Period Weeks    Status On-going    Target Date 06/26/2022     PT LONG TERM GOAL #2   Title Patient will increase Berg Balance score by > 6 points to demonstrate decreased fall risk during functional activities.    Baseline 4/20: 43/56 6/6: 48/56; 713/2023= 50/56   Time 12    Period Weeks    Status Achieved   Target Date 03/29/2022     PT LONG TERM GOAL #3   Title Patient will increase 10 meter walk test to >1.46ms as to improve gait speed for better community ambulation and to reduce fall risk.    Baseline 8/9: 0.65 m/s with rollator; 10/31: 0.68 m/s with rollator. 07/06/2021= 0.71 m/s using rollator; 08/22/2021= 0.94 m/s using rollator. 4/20: 1.1 m/s with rollator    Time 12    Period Weeks    Status Achieved    Target Date 10/11/21      PT LONG TERM GOAL #4   Title Patient will increase six minute walk test distance to >1000 for progression to community ambulator and improve gait ability    Baseline 8/9: 505 ft with rollator; 03/21/21: [id number] rollator, 04/24/21: [id number] rollator; 07/06/2021=900 feet; 08/22/2021= 935 feet with use of Rollator 44/20: 940 ft 11/28/21: 1055 feet with rollator   Time 12    Period Weeks    Status Achieved    Target Date 01/04/22      PT LONG TERM GOAL #5   Title Patient will increase glute medius strength on Left LE from able to lift off 3" (sidelye on mat) to > 5" to improve stability, gait mechanics, and functional strength.    Baseline 10/31: 3-/5; 07/06/2021= 3-/5; 08/22/2021=3-/5 unable to achieve full ROM yet able to withstand some resistance with testing 4/20: unable to obtainfull ROM against gravity 6/6: unable to move against gravity; 01/04/2022= Continues to have difficulty - unable to raise left LE in sidelye against gravity but  able to perform supine left Hip abd with some resistance= 3-/5; 03/22/2022- Patient able to raise his left LE 3 in off mat today. 04/03/2022= Patient able to raise left LE 3" off mat in sidelye today   Time 12    Period Weeks    Status Revised   Target Date 06/26/2022     PT LONG TERM GOAL #6   Title Patient will increase Left single leg stance time to 15 seconds or greater to increase safety in shower and independence with ADLs.    Baseline 10/31; 1.83 sec without UE support; 08/22/2021=2-3  sec  on left; 6 sec on right 4/20: RLE 10 seconds LLE unable to perform 6/6: R LE 4 seconds during BERG; 01/04/2022= left = 3 sec and right = 8 sec; 02/13/2022= 20 sec on right at best and 4 sec on left LE at best. 03/23/2022=Patient able to stand on left LE at best for 7 sec today. 04/03/2022= 27 sec on right and 12 sec on left   Time 12    Period Weeks    Status On-going    Target Date 06/26/2022     PT LONG TERM GOAL #7   Title Patient will safely negotiate 13 steps with handrails to safely enter/exit mothers house.    Baseline 4/20: very challenging 6/6: able to copmlete safely even managing dogs per pt report; 01/04/2022- Patient performed 16 steps with B Rail-no difficulty other  than fatigue.    Time 12    Period Weeks    Status Achieved   Target Date 01/04/22    PT LONG TERM GOAL #8  Title Patient will increase six minute walk test distance to >1100 ft.  for progression to community ambulator and improve gait ability   Baseline 04/03/2022= 920 feet with 4WW - (Added another goal for endurance to continue to progress community amb distances)   Time 12   Period Weeks   Status INITIAL  Target Date 06/26/2022            Plan - 12/13/21 1340     Clinical Impression Statement Treatment continued to focus on LE strengthening in wellzone. Patient continues to demonstrate progress- able to maneuver around equipment with less overall assist from PT today. Patient was able to move LE into position and  independent move pin into appropriate weight stack hole. He continues to require some assist to hold left LE into position on leg press and HIP ER- manual pressure to push hip into neutral.  Pt will continue to benefit from skilled PT interventions to improve his overall strength, mobility and quality of life.    Personal Factors and Comorbidities Age;Comorbidity 3+;Fitness;Past/Current Experience;Time since onset of injury/illness/exacerbation    Comorbidities aroxysmal a fib, MS, lymphedema, hypothyroidism, Graves Disease, arthritis, lumbar herniated disc (L5), depression and neuropathy    Examination-Activity Limitations Bed Mobility;Bend;Caring for Others;Carry;Reach Overhead;Locomotion Level;Lift;Hygiene/Grooming;Dressing;Squat;Stairs;Stand;Toileting;Transfers    Examination-Participation Restrictions Cleaning;Community Activity;Laundry;Occupation;Shop;Volunteer;Yard Work    Merchant [employer] officer Evolving/Moderate complexity    Rehab Potential Fair    PT Frequency 2x / week    PT Duration 12 weeks    PT Treatment/Interventions ADLs/Self Care Home Management;Aquatic Therapy;Canalith Repostioning;Biofeedback;Cryotherapy;Ultrasound;Traction;Moist Heat;Electrical Stimulation;DME Instruction;Gait training;Therapeutic exercise;Therapeutic activities;Functional mobility training;Stair training;Balance training;Neuromuscular re-education;Patient/family education;Manual techniques;Orthotic Fit/Training;Compression bandaging;Passive range of motion;Vestibular;Taping;Splinting;Energy conservation;Dry needling;Visual/perceptual remediation/compensation    PT Next Visit Plan Continue with progessive balance training, Progressive LE strenthening as appropriate.    PT Home Exercise Plan Access Code: [email or web address] URL: [email or web address]  Added [place] with BTB and encouraged more walking around the home during commerical breaks when watching TV.    Consulted and Agree with Plan of Care  Patient              Lewis Moccasin PT 2:44 PM, 04/12/22 Physical Therapist - [place] [phone]     12/13/2021, 1:41 PM

## 2022-04-17 ENCOUNTER — Ambulatory Visit: Payer: Medicare PPO

## 2022-04-17 DIAGNOSIS — E042 Nontoxic multinodular goiter: Secondary | ICD-10-CM | POA: Diagnosis not present

## 2022-04-19 ENCOUNTER — Ambulatory Visit: Payer: Medicare PPO

## 2022-04-19 DIAGNOSIS — R278 Other lack of coordination: Secondary | ICD-10-CM

## 2022-04-19 DIAGNOSIS — R2689 Other abnormalities of gait and mobility: Secondary | ICD-10-CM | POA: Diagnosis not present

## 2022-04-19 DIAGNOSIS — R262 Difficulty in walking, not elsewhere classified: Secondary | ICD-10-CM | POA: Diagnosis not present

## 2022-04-19 DIAGNOSIS — R2681 Unsteadiness on feet: Secondary | ICD-10-CM | POA: Diagnosis not present

## 2022-04-19 DIAGNOSIS — R269 Unspecified abnormalities of gait and mobility: Secondary | ICD-10-CM | POA: Diagnosis not present

## 2022-04-19 DIAGNOSIS — M6281 Muscle weakness (generalized): Secondary | ICD-10-CM | POA: Diagnosis not present

## 2022-04-19 DIAGNOSIS — I890 Lymphedema, not elsewhere classified: Secondary | ICD-10-CM | POA: Diagnosis not present

## 2022-04-19 NOTE — Therapy (Signed)
OUTPATIENT PHYSICAL THERAPY TREATMENT NOTE   Patient Name: Dustin Gray MRN: [id number] DOB:08/25/1955, 66 y.o., male 32 Date: 04/19/2022  PCP: [name], MD REFERRING PROVIDER: [name], MD   PT End of Session - 04/19/22 1347     Visit Number 87    Number of Visits 107    Date for PT Re-Evaluation 06/26/22    Authorization Type [employer] Auth 11/28/21-02/22/22 for 24 visits    Authorization Time Period Cert for 10/02/79-19/1/47; Recert 82/95/6213- 0/01/6577    Progress Note Due on Visit 90    PT Start Time 1346    PT Stop Time 1430    PT Time Calculation (min) 44 min    Equipment Utilized During Treatment Gait belt    Activity Tolerance Patient tolerated treatment well;No increased pain;Patient limited by fatigue    Behavior During Therapy [facility] for tasks assessed/performed                       Past Medical History:  Diagnosis Date   Abscess    groin   Arthritis    lower spine   Erectile dysfunction    Low testosterone    Lumbar herniated disc    L5   Multiple sclerosis ([place])    Staph aureus infection    Past Surgical History:  Procedure Laterality Date   CARDIOVERSION N/A 11/22/2021   Procedure: CARDIOVERSION;  Surgeon: [name], MD;  Location: [facility];  Service: Cardiovascular;  Laterality: N/A;   COLONOSCOPY WITH PROPOFOL N/A 07/04/2016   Procedure: COLONOSCOPY WITH PROPOFOL;  Surgeon: [name], MD;  Location: [place];  Service: Endoscopy;  Laterality: N/A;   [place]   POLYPECTOMY  07/04/2016   Procedure: POLYPECTOMY;  Surgeon: [name], MD;  Location: [place];  Service: Endoscopy;;   TONSILLECTOMY     Patient Active Problem List   Diagnosis Date Noted   Persistent atrial fibrillation ([place])    Graves disease    Abdominal bloating    Atrial fibrillation with RVR ([place]) 10/01/2019   Hyperthyroidism    Atrial fibrillation with rapid ventricular  response ([place]) 09/30/2019   Leg edema    Left leg cellulitis    Special screening for malignant neoplasms, colon    Polyp of sigmoid colon    Benign neoplasm of ascending colon    Rectal polyp    Herniated nucleus pulposus, L5-S1 11/09/2015   Low back pain 10/25/2015   Herniated nucleus pulposus, C5-6 right 10/06/2015   Dysesthesia 09/16/2015   Spasticity 09/16/2015   Unsteady gait 09/16/2015   Multiple sclerosis ([place]) 06/08/2011     REFERRING DIAG: R26.81 (ICD-10-CM) - Unsteadiness on feet   THERAPY DIAG:  Unsteadiness on feet  Difficulty in walking, not elsewhere classified  Abnormality of gait and mobility  Muscle weakness (generalized)  Rationale for Evaluation and Treatment Rehabilitation  PERTINENT HISTORY: Patient presents to physical therapy for unsteadiness, walking instability, fatigue. His PMH includes paroxysmal a fib, MS, lymphedema, hypothyroidism, Graves Disease, arthritis, lumbar herniated disc (L5), depression and neuropathy. Patient first developed symptoms of MS in 2005 and was diagnosed in 2006. Patient has done PT in the past, but hasn't been seen since 2020 at [facility]. Has not been doing his exercises in the past year. Walk outside to garage to ride lawnmower, walks in house. 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 Still currently high up to 6/10 NPS in low back.   PAIN:  Are you having pain? Yes. See above.    TODAY'S TREATMENT:   There.ex: B hamstring stretch: 2x1 minute B knee to chest: 2x1 minute, added some hip adduction during knee to chest.  Hook lying lumbar trunk rotations: x15/direction  Well zone exercises:   Leg press: Seat at level 12 Level 2 right LE x 12 reps; Left LE - level 2- 12 reps   Level 3 RLE x10; LLE x10    Level 4 RLE x8; LLE x8   Level 5  right LE x 6 reps; Left LE- Level 5 - 6 reps   Level 6 RLE x4; LLE x4  Level 7 right LE x 2 reps; Right LE - Level 7- 2 reps     Calf press (Today pt requires some manual assistance keeping LLE on pad):  Level 2 each LE x 12 reps each Level 3 each LE x 10 reps each Level 4 each LE x 8 reps each Level 5 each LE x 6 reps each  Level 6 each LE x 4 reps  Level 7 right LE x 2 reps; Level 4 right LE x 2 reps     PATIENT EDUCATION: Education details: exercise technique, body mechanics Person educated: Patient Education method: Customer service manager, verbal cues Education comprehension: verbalized understanding, returned demonstration    HOME EXERCISE PROGRAM: No changes this session   PT Short Term Goals -       PT SHORT TERM GOAL #1   Title Patient will be independent in home exercise program to improve strength/mobility for better functional independence with ADLs.    Baseline 8/9: HEP to be given next session, 10/31: patient reports compliance with HEP and would like progressions added next session. 08/22/2021=Patient verbalized that he is walking and doing his LE exercises with no questions at this time.    Time 4    Period Weeks    Status Achieved    Target Date 08/16/21              PT Long Term Goals       PT LONG TERM GOAL #1   Title Patient will increase FOTO score to equal to or greater than 70%  to demonstrate statistically significant improvement in mobility and quality of life.    Baseline 8/9: 64%; risk adjusted 34%; 03/21/21 FOTO: 45; 04/24/21 FOTO: 49% ; 08/22/2021= 56% 4/20: 60% 11/28/21: 63%; 01/04/2022= 60%. 04/03/2022= Will reassess next visit   Time 12    Period Weeks    Status On-going    Target Date 06/26/2022     PT LONG TERM GOAL #2   Title Patient will increase Berg Balance score by > 6 points to demonstrate decreased fall risk during functional activities.    Baseline 4/20: 43/56 6/6: 48/56; 713/2023= 50/56   Time 12    Period Weeks     Status Achieved   Target Date 03/29/2022     PT LONG TERM GOAL #3   Title Patient will increase 10 meter walk test to >1.82ms as to improve gait speed for better community ambulation and to reduce fall risk.    Baseline 8/9: 0.65 m/s with rollator; 10/31: 0.68 m/s with rollator. 07/06/2021= 0.71 m/s using rollator; 08/22/2021= 0.94 m/s using rollator. 4/20: 1.1 m/s with rollator    Time  12    Period Weeks    Status Achieved    Target Date 10/11/21      PT LONG TERM GOAL #4   Title Patient will increase six minute walk test distance to >1000 for progression to community ambulator and improve gait ability    Baseline 8/9: 505 ft with rollator; 03/21/21: 713f c rollator, 04/24/21: 7549fc rollator; 07/06/2021=900 feet; 08/22/2021= 935 feet with use of Rollator 44/20: 940 ft 11/28/21: 1055 feet with rollator   Time 12    Period Weeks    Status Achieved    Target Date 01/04/22      PT LONG TERM GOAL #5   Title Patient will increase glute medius strength on Left LE from able to lift off 3" (sidelye on mat) to > 5" to improve stability, gait mechanics, and functional strength.    Baseline 10/31: 3-/5; 07/06/2021= 3-/5; 08/22/2021=3-/5 unable to achieve full ROM yet able to withstand some resistance with testing 4/20: unable to obtainfull ROM against gravity 6/6: unable to move against gravity; 01/04/2022= Continues to have difficulty - unable to raise left LE in sidelye against gravity but able to perform supine left Hip abd with some resistance= 3-/5; 03/22/2022- Patient able to raise his left LE 3 in off mat today. 04/03/2022= Patient able to raise left LE 3" off mat in sidelye today   Time 12    Period Weeks    Status Revised   Target Date 06/26/2022     PT LONG TERM GOAL #6   Title Patient will increase Left single leg stance time to 15 seconds or greater to increase safety in shower and independence with ADLs.    Baseline 10/31; 1.83 sec without UE support; 08/22/2021=2-3  sec  on left; 6 sec on right  4/20: RLE 10 seconds LLE unable to perform 6/6: R LE 4 seconds during BERG; 01/04/2022= left = 3 sec and right = 8 sec; 02/13/2022= 20 sec on right at best and 4 sec on left LE at best. 03/23/2022=Patient able to stand on left LE at best for 7 sec today. 04/03/2022= 27 sec on right and 12 sec on left   Time 12    Period Weeks    Status On-going    Target Date 06/26/2022     PT LONG TERM GOAL #7   Title Patient will safely negotiate 13 steps with handrails to safely enter/exit mothers house.    Baseline 4/20: very challenging 6/6: able to copmlete safely even managing dogs per pt report; 01/04/2022- Patient performed 16 steps with B Rail-no difficulty other than fatigue.    Time 12    Period Weeks    Status Achieved   Target Date 01/04/22    PT LONG TERM GOAL #8  Title Patient will increase six minute walk test distance to >1100 ft.  for progression to community ambulator and improve gait ability   Baseline 04/03/2022= 920 feet with 4WW - (Added another goal for endurance to continue to progress community amb distances)   Time 12   Period Weeks   Status INITIAL  Target Date 06/26/2022            Plan - 12/13/21 1340     Clinical Impression Statement Due to increased LBP, beginning of session with focus on flexibility and gentle hip/lumbar mobility to decrease pain. Otherwise continuing PT POC with focus on resistance gym based exercise to improve LE strength for transfers and gait. Pt independent with exercises but requires some LLE  assist for positioning and keeping hip in neutral alignment with leg press. Pt will continue to benefit from skilled PT interventions to improve his overall strength, mobility and quality of life.    Personal Factors and Comorbidities Age;Comorbidity 3+;Fitness;Past/Current Experience;Time since onset of injury/illness/exacerbation    Comorbidities aroxysmal a fib, MS, lymphedema, hypothyroidism, Graves Disease, arthritis, lumbar herniated disc (L5), depression  and neuropathy    Examination-Activity Limitations Bed Mobility;Bend;Caring for Others;Carry;Reach Overhead;Locomotion Level;Lift;Hygiene/Grooming;Dressing;Squat;Stairs;Stand;Toileting;Transfers    Examination-Participation Restrictions Cleaning;Community Activity;Laundry;Occupation;Shop;Volunteer;Yard Work    Merchant navy officer Evolving/Moderate complexity    Rehab Potential Fair    PT Frequency 2x / week    PT Duration 12 weeks    PT Treatment/Interventions ADLs/Self Care Home Management;Aquatic Therapy;Canalith Repostioning;Biofeedback;Cryotherapy;Ultrasound;Traction;Moist Heat;Electrical Stimulation;DME Instruction;Gait training;Therapeutic exercise;Therapeutic activities;Functional mobility training;Stair training;Balance training;Neuromuscular re-education;Patient/family education;Manual techniques;Orthotic Fit/Training;Compression bandaging;Passive range of motion;Vestibular;Taping;Splinting;Energy conservation;Dry needling;Visual/perceptual remediation/compensation    PT Next Visit Plan Continue with progessive balance training, Progressive LE strenthening as appropriate.    PT Home Exercise Plan Access Code: [email or web address] URL: [email or web address]  Added Bridges with BTB and encouraged more walking around the home during commerical breaks when watching TV.    Consulted and Agree with Plan of Care Patient              Salem Caster. Fairly IV, PT, DPT Physical Therapist- [facility]  3:20 PM, 04/19/22

## 2022-04-20 ENCOUNTER — Encounter: Payer: Medicare PPO | Admitting: Occupational Therapy

## 2022-04-24 ENCOUNTER — Ambulatory Visit: Payer: Medicare PPO

## 2022-04-24 DIAGNOSIS — I890 Lymphedema, not elsewhere classified: Secondary | ICD-10-CM | POA: Diagnosis not present

## 2022-04-24 DIAGNOSIS — R2689 Other abnormalities of gait and mobility: Secondary | ICD-10-CM | POA: Diagnosis not present

## 2022-04-24 DIAGNOSIS — M6281 Muscle weakness (generalized): Secondary | ICD-10-CM

## 2022-04-24 DIAGNOSIS — R269 Unspecified abnormalities of gait and mobility: Secondary | ICD-10-CM

## 2022-04-24 DIAGNOSIS — R2681 Unsteadiness on feet: Secondary | ICD-10-CM | POA: Diagnosis not present

## 2022-04-24 DIAGNOSIS — E042 Nontoxic multinodular goiter: Secondary | ICD-10-CM | POA: Diagnosis not present

## 2022-04-24 DIAGNOSIS — R278 Other lack of coordination: Secondary | ICD-10-CM | POA: Diagnosis not present

## 2022-04-24 DIAGNOSIS — R262 Difficulty in walking, not elsewhere classified: Secondary | ICD-10-CM | POA: Diagnosis not present

## 2022-04-24 DIAGNOSIS — E0500 Thyrotoxicosis with diffuse goiter without thyrotoxic crisis or storm: Secondary | ICD-10-CM | POA: Diagnosis not present

## 2022-04-24 NOTE — Therapy (Signed)
OUTPATIENT PHYSICAL THERAPY TREATMENT NOTE   Patient Name: Dustin Gray MRN: [id number] DOB:12/02/1955, 66 y.o., male 76 Date: 04/24/2022  PCP: [name], MD REFERRING PROVIDER: [name], MD   PT End of Session - 04/24/22 1443     Visit Number 88    Number of Visits 107    Date for PT Re-Evaluation 06/26/22    Authorization Type [employer] Auth 11/28/21-02/22/22 for 24 visits    Authorization Time Period Cert for 0/86/76-19/5/09; Recert 32/67/1245- 8/0/9983    Progress Note Due on Visit 90    PT Start Time 1435    PT Stop Time 1515    PT Time Calculation (min) 40 min    Equipment Utilized During Treatment Gait belt    Activity Tolerance Patient tolerated treatment well;No increased pain;Patient limited by fatigue    Behavior During Therapy [facility] for tasks assessed/performed                       Past Medical History:  Diagnosis Date   Abscess    groin   Arthritis    lower spine   Erectile dysfunction    Low testosterone    Lumbar herniated disc    L5   Multiple sclerosis ([place])    Staph aureus infection    Past Surgical History:  Procedure Laterality Date   CARDIOVERSION N/A 11/22/2021   Procedure: CARDIOVERSION;  Surgeon: [name], MD;  Location: [facility];  Service: Cardiovascular;  Laterality: N/A;   COLONOSCOPY WITH PROPOFOL N/A 07/04/2016   Procedure: COLONOSCOPY WITH PROPOFOL;  Surgeon: [name], MD;  Location: [place];  Service: Endoscopy;  Laterality: N/A;   [place]   POLYPECTOMY  07/04/2016   Procedure: POLYPECTOMY;  Surgeon: [name], MD;  Location: [place];  Service: Endoscopy;;   TONSILLECTOMY     Patient Active Problem List   Diagnosis Date Noted   Persistent atrial fibrillation ([place])    Graves disease    Abdominal bloating    Atrial fibrillation with RVR ([place]) 10/01/2019   Hyperthyroidism    Atrial fibrillation with rapid ventricular  response ([place]) 09/30/2019   Leg edema    Left leg cellulitis    Special screening for malignant neoplasms, colon    Polyp of sigmoid colon    Benign neoplasm of ascending colon    Rectal polyp    Herniated nucleus pulposus, L5-S1 11/09/2015   Low back pain 10/25/2015   Herniated nucleus pulposus, C5-6 right 10/06/2015   Dysesthesia 09/16/2015   Spasticity 09/16/2015   Unsteady gait 09/16/2015   Multiple sclerosis ([place]) 06/08/2011     REFERRING DIAG: R26.81 (ICD-10-CM) - Unsteadiness on feet   THERAPY DIAG:  Unsteadiness on feet  Difficulty in walking, not elsewhere classified  Abnormality of gait and mobility  Muscle weakness (generalized)  Rationale for Evaluation and Treatment Rehabilitation  PERTINENT HISTORY: Patient presents to physical therapy for unsteadiness, walking instability, fatigue. His PMH includes paroxysmal a fib, MS, lymphedema, hypothyroidism, Graves Disease, arthritis, lumbar herniated disc (L5), depression and neuropathy. Patient first developed symptoms of MS in 2005 and was diagnosed in 2006. Patient has done PT in the past, but hasn't been seen since 2020 at [facility] Has not been doing his exercises in the past year. Walk outside to garage to ride lawnmower, walks in house. Retired Automotive engineer. Has a rollator at home but doesn't use it in the house   PRECAUTIONS:  Fall  SUBJECTIVE: Patient reports low back is sore but not as bad as last week  PAIN:  Are you having pain? Yes. See above.    TODAY'S TREATMENT:   There.ex:  Well zone exercises:   Knee ext-  Level 2 x  12 reps Left LE;  Level 2 Right LE x 12 reps Level 3 x 10 reps Left LE:  Level 3 Right LE x 10 reps Level 3 x 8 reps Left LE; level 4 Right LE x 8 reps Level 3 x 6 reps Left  LE; Level 5 Right LE  x 6 reps  Level 3 x 4 reps Left LE; level  6 right LE x 4 reps  Level 4 x 2 reps Left LE; Level  7 right LE x 2 reps   Knee flex:   Level 1 Left LE x 12; Level 1 x 12 with  right LE Level 2 Left LE x 10  reps; Level  2x 10 reps with right LE Level  3 Left LE x 8 reps; Level 3 x 8 reps with right LE Level 4 left LE x 6 reps ; Level 5 x 6 reps with right LE Level 5 Left LE x 4 reps (near full ROM); Level 5 x 4 reps right LE Level 6 Left LE x 2 reps (partial ROM); Level 6 x 2 reps right LE    Leg press: Seat at level 12  Level 2 right LE x 12 reps; Left LE - level 2- 12 reps Level 3 RLE x10; LLE x10  Level 4 RLE x8; LLE x8  Level 5 right LE x 6 reps; Left LE- Level 5 - 6 reps  Level 6 RLE x4; LLE x4 Level 7 right LE x 2 reps; Right LE - Level 7- 2 reps     Calf press (Today pt requires some manual assistance keeping LLE on pad):  Level 2 each LE x 12 reps each Level 3 each LE x 10 reps each Level 4 each LE x 8 reps each Level 5 each LE x 6 reps each  Level 6 each LE x 4 reps  Level 7 right LE x 2 reps; Level 4 right LE x 2 reps     PATIENT EDUCATION: Education details: exercise technique, body mechanics Person educated: Patient Education method: Customer service manager, verbal cues Education comprehension: verbalized understanding, returned demonstration    HOME EXERCISE PROGRAM: No changes this session   PT Short Term Goals -       PT SHORT TERM GOAL #1   Title Patient will be independent in home exercise program to improve strength/mobility for better functional independence with ADLs.    Baseline 8/9: HEP to be given next session, 10/31: patient reports compliance with HEP and would like progressions added next session. 08/22/2021=Patient verbalized that he is walking and doing his LE exercises with no questions at this time.    Time 4    Period Weeks    Status Achieved    Target Date 08/16/21              PT Long Term Goals       PT LONG TERM GOAL #1   Title Patient will increase FOTO score to equal to or greater than 70%  to demonstrate statistically significant improvement in mobility and quality of life.    Baseline 8/9:  64%; risk adjusted 34%; 03/21/21 FOTO: 45; 04/24/21 FOTO: 49% ; 08/22/2021= 56% 4/20: 60% 11/28/21: 63%; 01/04/2022= 60%.  04/03/2022= Will reassess next visit   Time 12    Period Weeks    Status On-going    Target Date 06/26/2022     PT LONG TERM GOAL #2   Title Patient will increase Berg Balance score by > 6 points to demonstrate decreased fall risk during functional activities.    Baseline 4/20: 43/56 6/6: 48/56; 713/2023= 50/56   Time 12    Period Weeks    Status Achieved   Target Date 03/29/2022     PT LONG TERM GOAL #3   Title Patient will increase 10 meter walk test to >1.48ms as to improve gait speed for better community ambulation and to reduce fall risk.    Baseline 8/9: 0.65 m/s with rollator; 10/31: 0.68 m/s with rollator. 07/06/2021= 0.71 m/s using rollator; 08/22/2021= 0.94 m/s using rollator. 4/20: 1.1 m/s with rollator    Time 12    Period Weeks    Status Achieved    Target Date 10/11/21      PT LONG TERM GOAL #4   Title Patient will increase six minute walk test distance to >1000 for progression to community ambulator and improve gait ability    Baseline 8/9: 505 ft with rollator; 03/21/21: [id number] rollator, 04/24/21: [id number] rollator; 07/06/2021=900 feet; 08/22/2021= 935 feet with use of Rollator 44/20: 940 ft 11/28/21: 1055 feet with rollator   Time 12    Period Weeks    Status Achieved    Target Date 01/04/22      PT LONG TERM GOAL #5   Title Patient will increase glute medius strength on Left LE from able to lift off 3" (sidelye on mat) to > 5" to improve stability, gait mechanics, and functional strength.    Baseline 10/31: 3-/5; 07/06/2021= 3-/5; 08/22/2021=3-/5 unable to achieve full ROM yet able to withstand some resistance with testing 4/20: unable to obtainfull ROM against gravity 6/6: unable to move against gravity; 01/04/2022= Continues to have difficulty - unable to raise left LE in sidelye against gravity but able to perform supine left Hip abd with some resistance=  3-/5; 03/22/2022- Patient able to raise his left LE 3 in off mat today. 04/03/2022= Patient able to raise left LE 3" off mat in sidelye today   Time 12    Period Weeks    Status Revised   Target Date 06/26/2022     PT LONG TERM GOAL #6   Title Patient will increase Left single leg stance time to 15 seconds or greater to increase safety in shower and independence with ADLs.    Baseline 10/31; 1.83 sec without UE support; 08/22/2021=2-3  sec  on left; 6 sec on right 4/20: RLE 10 seconds LLE unable to perform 6/6: R LE 4 seconds during BERG; 01/04/2022= left = 3 sec and right = 8 sec; 02/13/2022= 20 sec on right at best and 4 sec on left LE at best. 03/23/2022=Patient able to stand on left LE at best for 7 sec today. 04/03/2022= 27 sec on right and 12 sec on left   Time 12    Period Weeks    Status On-going    Target Date 06/26/2022     PT LONG TERM GOAL #7   Title Patient will safely negotiate 13 steps with handrails to safely enter/exit mothers house.    Baseline 4/20: very challenging 6/6: able to copmlete safely even managing dogs per pt report; 01/04/2022- Patient performed 16 steps with B Rail-no difficulty other than fatigue.  Time 12    Period Weeks    Status Achieved   Target Date 01/04/22    PT LONG TERM GOAL #8  Title Patient will increase six minute walk test distance to >1100 ft.  for progression to community ambulator and improve gait ability   Baseline 04/03/2022= 920 feet with 4WW - (Added another goal for endurance to continue to progress community amb distances)   Time 12   Period Weeks   Status INITIAL  Target Date 06/26/2022            Plan - 12/13/21 1340     Clinical Impression Statement Treatment continues to focus on LE strengthening. Patient exhibiting more difficulty with some increased left quad weakness initially but did improve overall and performed well with leg press with less physical assist to keep leg in neutral position and avoid excessive hip abd.   Pt  will continue to benefit from skilled PT interventions to improve his overall strength, mobility and quality of life.    Personal Factors and Comorbidities Age;Comorbidity 3+;Fitness;Past/Current Experience;Time since onset of injury/illness/exacerbation    Comorbidities aroxysmal a fib, MS, lymphedema, hypothyroidism, Graves Disease, arthritis, lumbar herniated disc (L5), depression and neuropathy    Examination-Activity Limitations Bed Mobility;Bend;Caring for Others;Carry;Reach Overhead;Locomotion Level;Lift;Hygiene/Grooming;Dressing;Squat;Stairs;Stand;Toileting;Transfers    Examination-Participation Restrictions Cleaning;Community Activity;Laundry;Occupation;Shop;Volunteer;Yard Work    Merchant navy officer Evolving/Moderate complexity    Rehab Potential Fair    PT Frequency 2x / week    PT Duration 12 weeks    PT Treatment/Interventions ADLs/Self Care Home Management;Aquatic Therapy;Canalith Repostioning;Biofeedback;Cryotherapy;Ultrasound;Traction;Moist Heat;Electrical Stimulation;DME Instruction;Gait training;Therapeutic exercise;Therapeutic activities;Functional mobility training;Stair training;Balance training;Neuromuscular re-education;Patient/family education;Manual techniques;Orthotic Fit/Training;Compression bandaging;Passive range of motion;Vestibular;Taping;Splinting;Energy conservation;Dry needling;Visual/perceptual remediation/compensation    PT Next Visit Plan Continue with progessive balance training, Progressive LE strenthening as appropriate.    PT Home Exercise Plan Access Code: [email or web address] URL: [email or web address]  Added Bridges with BTB and encouraged more walking around the home during commerical breaks when watching TV.    Consulted and Agree with Plan of Care Patient              Ollen Bowl, PT Physical Therapist- [facility]  3:27 PM, 04/24/22

## 2022-04-26 ENCOUNTER — Ambulatory Visit: Payer: Medicare PPO | Attending: Neurology

## 2022-04-26 DIAGNOSIS — R2681 Unsteadiness on feet: Secondary | ICD-10-CM | POA: Insufficient documentation

## 2022-04-26 DIAGNOSIS — R269 Unspecified abnormalities of gait and mobility: Secondary | ICD-10-CM | POA: Insufficient documentation

## 2022-04-26 DIAGNOSIS — M6281 Muscle weakness (generalized): Secondary | ICD-10-CM | POA: Diagnosis not present

## 2022-04-26 DIAGNOSIS — R278 Other lack of coordination: Secondary | ICD-10-CM | POA: Diagnosis not present

## 2022-04-26 DIAGNOSIS — R262 Difficulty in walking, not elsewhere classified: Secondary | ICD-10-CM | POA: Insufficient documentation

## 2022-04-26 DIAGNOSIS — R2689 Other abnormalities of gait and mobility: Secondary | ICD-10-CM | POA: Diagnosis not present

## 2022-04-26 NOTE — Therapy (Signed)
OUTPATIENT PHYSICAL THERAPY TREATMENT NOTE   Patient Name: Dustin Gray MRN: [id number] DOB:1956/02/12, 66 y.o., male Today's Date: 04/26/2022  PCP: [name], MD REFERRING PROVIDER: [name], MD   PT End of Session - 04/26/22 1522     Visit Number 72    Number of Visits 107    Date for PT Re-Evaluation 06/26/22    Authorization Type [employer] Auth 11/28/21-02/22/22 for 24 visits    Authorization Time Period Cert for 9/93/71-69/6/78; Recert 93/81/0175- 1/0/2585    Progress Note Due on Visit 90    PT Start Time 1435    PT Stop Time 1517    PT Time Calculation (min) 42 min    Equipment Utilized During Treatment Gait belt    Activity Tolerance Patient tolerated treatment well;No increased pain;Patient limited by fatigue    Behavior During Therapy [facility] for tasks assessed/performed                       Past Medical History:  Diagnosis Date   Abscess    groin   Arthritis    lower spine   Erectile dysfunction    Low testosterone    Lumbar herniated disc    L5   Multiple sclerosis ([place])    Staph aureus infection    Past Surgical History:  Procedure Laterality Date   CARDIOVERSION N/A 11/22/2021   Procedure: CARDIOVERSION;  Surgeon: [name], MD;  Location: [facility];  Service: Cardiovascular;  Laterality: N/A;   COLONOSCOPY WITH PROPOFOL N/A 07/04/2016   Procedure: COLONOSCOPY WITH PROPOFOL;  Surgeon: [name], MD;  Location: [place];  Service: Endoscopy;  Laterality: N/A;   [place]   POLYPECTOMY  07/04/2016   Procedure: POLYPECTOMY;  Surgeon: [name], MD;  Location: [place];  Service: Endoscopy;;   TONSILLECTOMY     Patient Active Problem List   Diagnosis Date Noted   Persistent atrial fibrillation ([place])    Graves disease    Abdominal bloating    Atrial fibrillation with RVR ([place]) 10/01/2019   Hyperthyroidism    Atrial fibrillation with rapid ventricular  response ([place]) 09/30/2019   Leg edema    Left leg cellulitis    Special screening for malignant neoplasms, colon    Polyp of sigmoid colon    Benign neoplasm of ascending colon    Rectal polyp    Herniated nucleus pulposus, L5-S1 11/09/2015   Low back pain 10/25/2015   Herniated nucleus pulposus, C5-6 right 10/06/2015   Dysesthesia 09/16/2015   Spasticity 09/16/2015   Unsteady gait 09/16/2015   Multiple sclerosis ([place]) 06/08/2011     REFERRING DIAG: R26.81 (ICD-10-CM) - Unsteadiness on feet   THERAPY DIAG:  Unsteadiness on feet  Difficulty in walking, not elsewhere classified  Abnormality of gait and mobility  Muscle weakness (generalized)  Rationale for Evaluation and Treatment Rehabilitation  PERTINENT HISTORY: Patient presents to physical therapy for unsteadiness, walking instability, fatigue. His PMH includes paroxysmal a fib, MS, lymphedema, hypothyroidism, Graves Disease, arthritis, lumbar herniated disc (L5), depression and neuropathy. Patient first developed symptoms of MS in 2005 and was diagnosed in 2006. Patient has done PT in the past, but hasn't been seen since 2020 at [facility]. Has not been doing his exercises in the past year. Walk outside to garage to ride lawnmower, walks in house. 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   PAIN:  Are you having pain? Yes. See above.    TODAY'S TREATMENT:   There.ex: Standing hip ext 4lb AW BLE x 15 reps Standing calf press- 4lb AW x 15 reps with VC for 2 sec hold Standing heel to toe gait sequencing- BTB x 15 reps Standing side step tap onto airex pad- focusing on contralateral Hip contraction/stabilizers x 15 reps Seated (knee extended position on left) - Swing up and over 1/2 spike ball x 15 reps. (Very difficult to clear ball - fatigue as limiting factor)   Standing hip flex with 4lb  BLE x 15 reps   PATIENT EDUCATION: Education details: exercise technique, body mechanics Person educated: Patient Education method: Customer service manager, verbal cues Education comprehension: verbalized understanding, returned demonstration    HOME EXERCISE PROGRAM: No changes this session   PT Short Term Goals -       PT SHORT TERM GOAL #1   Title Patient will be independent in home exercise program to improve strength/mobility for better functional independence with ADLs.    Baseline 8/9: HEP to be given next session, 10/31: patient reports compliance with HEP and would like progressions added next session. 08/22/2021=Patient verbalized that he is walking and doing his LE exercises with no questions at this time.    Time 4    Period Weeks    Status Achieved    Target Date 08/16/21              PT Long Term Goals       PT LONG TERM GOAL #1   Title Patient will increase FOTO score to equal to or greater than 70%  to demonstrate statistically significant improvement in mobility and quality of life.    Baseline 8/9: 64%; risk adjusted 34%; 03/21/21 FOTO: 45; 04/24/21 FOTO: 49% ; 08/22/2021= 56% 4/20: 60% 11/28/21: 63%; 01/04/2022= 60%. 04/03/2022= Will reassess next visit   Time 12    Period Weeks    Status On-going    Target Date 06/26/2022     PT LONG TERM GOAL #2   Title Patient will increase Berg Balance score by > 6 points to demonstrate decreased fall risk during functional activities.    Baseline 4/20: 43/56 6/6: 48/56; 713/2023= 50/56   Time 12    Period Weeks    Status Achieved   Target Date 03/29/2022     PT LONG TERM GOAL #3   Title Patient will increase 10 meter walk test to >1.41ms as to improve gait speed for better community ambulation and to reduce fall risk.    Baseline 8/9: 0.65 m/s with rollator; 10/31: 0.68 m/s with rollator. 07/06/2021= 0.71 m/s using rollator; 08/22/2021= 0.94 m/s using rollator. 4/20: 1.1 m/s with rollator    Time 12    Period  Weeks    Status Achieved    Target Date 10/11/21      PT LONG TERM GOAL #4   Title Patient will increase six minute walk test distance to >1000 for progression to community ambulator and improve gait ability    Baseline 8/9: 505 ft with rollator; 03/21/21: 7757fc rollator, 04/24/21: 75530f rollator; 07/06/2021=900 feet; 08/22/2021= 935 feet with use of Rollator 44/20: 940 ft 11/28/21: 1055 feet with rollator   Time 12    Period Weeks    Status Achieved    Target Date 01/04/22      PT LONG TERM GOAL #5  Title Patient will increase glute medius strength on Left LE from able to lift off 3" (sidelye on mat) to > 5" to improve stability, gait mechanics, and functional strength.    Baseline 10/31: 3-/5; 07/06/2021= 3-/5; 08/22/2021=3-/5 unable to achieve full ROM yet able to withstand some resistance with testing 4/20: unable to obtainfull ROM against gravity 6/6: unable to move against gravity; 01/04/2022= Continues to have difficulty - unable to raise left LE in sidelye against gravity but able to perform supine left Hip abd with some resistance= 3-/5; 03/22/2022- Patient able to raise his left LE 3 in off mat today. 04/03/2022= Patient able to raise left LE 3" off mat in sidelye today   Time 12    Period Weeks    Status Revised   Target Date 06/26/2022     PT LONG TERM GOAL #6   Title Patient will increase Left single leg stance time to 15 seconds or greater to increase safety in shower and independence with ADLs.    Baseline 10/31; 1.83 sec without UE support; 08/22/2021=2-3  sec  on left; 6 sec on right 4/20: RLE 10 seconds LLE unable to perform 6/6: R LE 4 seconds during BERG; 01/04/2022= left = 3 sec and right = 8 sec; 02/13/2022= 20 sec on right at best and 4 sec on left LE at best. 03/23/2022=Patient able to stand on left LE at best for 7 sec today. 04/03/2022= 27 sec on right and 12 sec on left   Time 12    Period Weeks    Status On-going    Target Date 06/26/2022     PT LONG TERM GOAL #7    Title Patient will safely negotiate 13 steps with handrails to safely enter/exit mothers house.    Baseline 4/20: very challenging 6/6: able to copmlete safely even managing dogs per pt report; 01/04/2022- Patient performed 16 steps with B Rail-no difficulty other than fatigue.    Time 12    Period Weeks    Status Achieved   Target Date 01/04/22    PT LONG TERM GOAL #8  Title Patient will increase six minute walk test distance to >1100 ft.  for progression to community ambulator and improve gait ability   Baseline 04/03/2022= 920 feet with 4WW - (Added another goal for endurance to continue to progress community amb distances)   Time 12   Period Weeks   Status INITIAL  Target Date 06/26/2022            Plan - 12/13/21 1340     Clinical Impression Statement Treatment focused on LE strengthening particularly hip today. Patient continues to present with some gluteal weakness- Medius and minimus - along with continued hyperextension of left LE. He was able to complete most therex with minimal rest and did improve with hip stabilization with practice and cues today.   Pt will continue to benefit from skilled PT interventions to improve his overall strength, mobility and quality of life.    Personal Factors and Comorbidities Age;Comorbidity 3+;Fitness;Past/Current Experience;Time since onset of injury/illness/exacerbation    Comorbidities aroxysmal a fib, MS, lymphedema, hypothyroidism, Graves Disease, arthritis, lumbar herniated disc (L5), depression and neuropathy    Examination-Activity Limitations Bed Mobility;Bend;Caring for Others;Carry;Reach Overhead;Locomotion Level;Lift;Hygiene/Grooming;Dressing;Squat;Stairs;Stand;Toileting;Transfers    Examination-Participation Restrictions Cleaning;Community Activity;Laundry;Occupation;Shop;Volunteer;Yard Work    Merchant navy officer Evolving/Moderate complexity    Rehab Potential Fair    PT Frequency 2x / week    PT Duration 12 weeks     PT Treatment/Interventions ADLs/Self Care Home  Management;Aquatic Therapy;Canalith Repostioning;Biofeedback;Cryotherapy;Ultrasound;Traction;Moist Heat;Electrical Stimulation;DME Instruction;Gait training;Therapeutic exercise;Therapeutic activities;Functional mobility training;Stair training;Balance training;Neuromuscular re-education;Patient/family education;Manual techniques;Orthotic Fit/Training;Compression bandaging;Passive range of motion;Vestibular;Taping;Splinting;Energy conservation;Dry needling;Visual/perceptual remediation/compensation    PT Next Visit Plan Continue with progessive balance training, Progressive LE strenthening as appropriate.    PT Home Exercise Plan Access Code: [email or web address] URL: [email or web address]  Added Bridges with BTB and encouraged more walking around the home during commerical breaks when watching TV.    Consulted and Agree with Plan of Care Patient              Ollen Bowl, PT Physical Therapist- [facility]  3:40 PM, 04/26/22

## 2022-05-01 ENCOUNTER — Ambulatory Visit: Payer: Medicare PPO

## 2022-05-01 DIAGNOSIS — M6281 Muscle weakness (generalized): Secondary | ICD-10-CM | POA: Diagnosis not present

## 2022-05-01 DIAGNOSIS — R269 Unspecified abnormalities of gait and mobility: Secondary | ICD-10-CM | POA: Diagnosis not present

## 2022-05-01 DIAGNOSIS — R278 Other lack of coordination: Secondary | ICD-10-CM

## 2022-05-01 DIAGNOSIS — R2681 Unsteadiness on feet: Secondary | ICD-10-CM | POA: Diagnosis not present

## 2022-05-01 DIAGNOSIS — R262 Difficulty in walking, not elsewhere classified: Secondary | ICD-10-CM | POA: Diagnosis not present

## 2022-05-01 DIAGNOSIS — R2689 Other abnormalities of gait and mobility: Secondary | ICD-10-CM | POA: Diagnosis not present

## 2022-05-01 NOTE — Therapy (Signed)
OUTPATIENT PHYSICAL THERAPY TREATMENT NOTE/Physical Therapy Progress Note   Dates of reporting period  03/22/2022   to   05/01/2022   Patient Name: Dustin Gray MRN: [id number] DOB:11/09/1955, 66 y.o., male Today's Date: 05/02/2022  PCP: [name], MD REFERRING PROVIDER: [name], MD   PT End of Session - 05/01/22 1442     Visit Number 22    Number of Visits 107    Date for PT Re-Evaluation 06/26/22    Authorization Type [employer] Auth 11/28/21-02/22/22 for 24 visits    Authorization Time Period Cert for 10/10/38-81/4/48; Recert 18/56/3149- 7/0/2637    Progress Note Due on Visit 100    PT Start Time 1430    PT Stop Time 1514    PT Time Calculation (min) 44 min    Equipment Utilized During Treatment Gait belt    Activity Tolerance Patient tolerated treatment well;No increased pain;Patient limited by fatigue    Behavior During Therapy [facility] for tasks assessed/performed                       Past Medical History:  Diagnosis Date   Abscess    groin   Arthritis    lower spine   Erectile dysfunction    Low testosterone    Lumbar herniated disc    L5   Multiple sclerosis ([place])    Staph aureus infection    Past Surgical History:  Procedure Laterality Date   CARDIOVERSION N/A 11/22/2021   Procedure: CARDIOVERSION;  Surgeon: [name], MD;  Location: [facility];  Service: Cardiovascular;  Laterality: N/A;   COLONOSCOPY WITH PROPOFOL N/A 07/04/2016   Procedure: COLONOSCOPY WITH PROPOFOL;  Surgeon: [name], MD;  Location: [place];  Service: Endoscopy;  Laterality: N/A;   [place]   POLYPECTOMY  07/04/2016   Procedure: POLYPECTOMY;  Surgeon: [name], MD;  Location: [place];  Service: Endoscopy;;   TONSILLECTOMY     Patient Active Problem List   Diagnosis Date Noted   Persistent atrial fibrillation ([place])    Graves disease    Abdominal bloating    Atrial fibrillation with  RVR ([place]) 10/01/2019   Hyperthyroidism    Atrial fibrillation with rapid ventricular response ([place]) 09/30/2019   Leg edema    Left leg cellulitis    Special screening for malignant neoplasms, colon    Polyp of sigmoid colon    Benign neoplasm of ascending colon    Rectal polyp    Herniated nucleus pulposus, L5-S1 11/09/2015   Low back pain 10/25/2015   Herniated nucleus pulposus, C5-6 right 10/06/2015   Dysesthesia 09/16/2015   Spasticity 09/16/2015   Unsteady gait 09/16/2015   Multiple sclerosis ([place]) 06/08/2011     REFERRING DIAG: R26.81 (ICD-10-CM) - Unsteadiness on feet   THERAPY DIAG:  Unsteadiness on feet  Difficulty in walking, not elsewhere classified  Abnormality of gait and mobility  Muscle weakness (generalized)  Rationale for Evaluation and Treatment Rehabilitation  PERTINENT HISTORY: Patient presents to physical therapy for unsteadiness, walking instability, fatigue. His PMH includes paroxysmal a fib, MS, lymphedema, hypothyroidism, Graves Disease, arthritis, lumbar herniated disc (L5), depression and neuropathy. Patient first developed symptoms of MS in 2005 and was diagnosed in 2006. Patient has done PT in the past, but hasn't been seen since 2020 at [facility]. Has not been doing his exercises in the past year. Walk outside to garage to ride lawnmower, walks in house. Retired  assistant principle. Has a rollator at home but doesn't use it in the house   PRECAUTIONS: Fall  SUBJECTIVE: Patient reports his low back pain has flared up pretty bad as he was attempting to vacuum over the weekend with resultant LBP.   PAIN:  Are you having pain? Yes. Low Back. 6/10- ache Worse with mobility, better with rest.     TODAY'S TREATMENT:   Therapeutic Activity:  Education with body mechanics while performing household activities including vacuuming. PT demonstrated method without rotating or twisting spine. Patient verbalized understanding of this technique.   Limited  ability to retest goals for progress note visit due to acute LBP.   Therex:  Core - Instructed in Transverse abdominal contraction- Hold 5 sec x 10 reps.  Core- TrA contraction with ball squeeze x 5 sec hold x 10 reps.   Manual therapy:   Manual stretching- knee to chest x 20 sec hold x 4 each LE Hamstring- hold 30 sec x 3 sets each LE- Increased tightness Left LE Figure 4- hold 30 sec x 3 each LE - Increased tightness right LE.  Standing hip ext 4lb AW BLE x 15 reps Standing calf press- 4lb AW x 15 reps with VC for 2 sec hold Standing heel to toe gait sequencing- BTB x 15 reps Standing side step tap onto airex pad- focusing on contralateral Hip contraction/stabilizers x 15 reps Seated (knee extended position on left) - Swing up and over 1/2 spike ball x 15 reps. (Very difficult to clear ball - fatigue as limiting factor)   Standing hip flex with 4lb BLE x 15 reps   PATIENT EDUCATION: Education details: exercise technique, body mechanics Person educated: Patient Education method: Customer service manager, verbal cues Education comprehension: verbalized understanding, returned demonstration    HOME EXERCISE PROGRAM: No changes this session   PT Short Term Goals -       PT SHORT TERM GOAL #1   Title Patient will be independent in home exercise program to improve strength/mobility for better functional independence with ADLs.    Baseline 8/9: HEP to be given next session, 10/31: patient reports compliance with HEP and would like progressions added next session. 08/22/2021=Patient verbalized that he is walking and doing his LE exercises with no questions at this time.    Time 4    Period Weeks    Status Achieved    Target Date 08/16/21              PT Long Term Goals       PT LONG TERM GOAL #1   Title Patient will increase FOTO score to equal to or greater than 70%  to demonstrate statistically significant improvement in mobility and quality of life.    Baseline  8/9: 64%; risk adjusted 34%; 03/21/21 FOTO: 45; 04/24/21 FOTO: 49% ; 08/22/2021= 56% 4/20: 60% 11/28/21: 63%; 01/04/2022= 60%. 03/22/2022= 73%   Time 12    Period Weeks    Status Achieved   Target Date 06/26/2022     PT LONG TERM GOAL #2   Title Patient will increase Berg Balance score by > 6 points to demonstrate decreased fall risk during functional activities.    Baseline 4/20: 43/56 6/6: 48/56; 713/2023= 50/56   Time 12    Period Weeks    Status Achieved   Target Date 03/29/2022     PT LONG TERM GOAL #3   Title Patient will increase 10 meter walk test to >1.68ms as to improve gait speed for  better community ambulation and to reduce fall risk.    Baseline 8/9: 0.65 m/s with rollator; 10/31: 0.68 m/s with rollator. 07/06/2021= 0.71 m/s using rollator; 08/22/2021= 0.94 m/s using rollator. 4/20: 1.1 m/s with rollator    Time 12    Period Weeks    Status Achieved    Target Date 10/11/21      PT LONG TERM GOAL #4   Title Patient will increase six minute walk test distance to >1000 for progression to community ambulator and improve gait ability    Baseline 8/9: 505 ft with rollator; 03/21/21: 715f c rollator, 04/24/21: 7510fc rollator; 07/06/2021=900 feet; 08/22/2021= 935 feet with use of Rollator 44/20: 940 ft 11/28/21: 1055 feet with rollator   Time 12    Period Weeks    Status Achieved    Target Date 01/04/22      PT LONG TERM GOAL #5   Title Patient will increase glute medius strength on Left LE from able to lift off 3" (sidelye on mat) to > 5" to improve stability, gait mechanics, and functional strength.    Baseline 10/31: 3-/5; 07/06/2021= 3-/5; 08/22/2021=3-/5 unable to achieve full ROM yet able to withstand some resistance with testing 4/20: unable to obtainfull ROM against gravity 6/6: unable to move against gravity; 01/04/2022= Continues to have difficulty - unable to raise left LE in sidelye against gravity but able to perform supine left Hip abd with some resistance= 3-/5; 03/22/2022-  Patient able to raise his left LE 3 in off mat today. 04/03/2022= Patient able to raise left LE 3" off mat in sidelye today; 05/01/2022= 05/01/2022= patient able to raise left LE from sidelye position= 5"   Time 12    Period Weeks    Status  PROGRESSING    Target Date 06/26/2022     PT LONG TERM GOAL #6   Title Patient will increase Left single leg stance time to 15 seconds or greater to increase safety in shower and independence with ADLs.    Baseline 10/31; 1.83 sec without UE support; 08/22/2021=2-3  sec  on left; 6 sec on right 4/20: RLE 10 seconds LLE unable to perform 6/6: R LE 4 seconds during BERG; 01/04/2022= left = 3 sec and right = 8 sec; 02/13/2022= 20 sec on right at best and 4 sec on left LE at best. 03/23/2022=Patient able to stand on left LE at best for 7 sec today. 04/03/2022= 27 sec on right and 12 sec on left;    Time 12    Period Weeks    Status On-going    Target Date 06/26/2022     PT LONG TERM GOAL #7   Title Patient will safely negotiate 13 steps with handrails to safely enter/exit mothers house.    Baseline 4/20: very challenging 6/6: able to copmlete safely even managing dogs per pt report; 01/04/2022- Patient performed 16 steps with B Rail-no difficulty other than fatigue.    Time 12    Period Weeks    Status Achieved   Target Date 01/04/22    PT LONG TERM GOAL #8  Title Patient will increase six minute walk test distance to >1100 ft.  for progression to community ambulator and improve gait ability   Baseline 04/03/2022= 920 feet with 4WW - (Added another goal for endurance to continue to progress community amb distances)   Time 12   Period Weeks   Status INITIAL  Target Date 06/26/2022  Plan - 12/13/21 1340     Clinical Impression Statement Patient presented with increased complaint of LBP today so unable to test all goals for progress note. He did respond well to manual techniques and use of moist heat to low back with reported decreased pain at end  of session. He was able to demo improving hip abd strength as seen by increased elevation of leg in sidelye position. Patient's condition has the potential to improve in response to therapy. Maximum improvement is yet to be obtained. The anticipated improvement is attainable and reasonable in a generally predictable time. Pt will continue to benefit from skilled PT interventions to improve his overall strength, mobility and quality of life.    Personal Factors and Comorbidities Age;Comorbidity 3+;Fitness;Past/Current Experience;Time since onset of injury/illness/exacerbation    Comorbidities aroxysmal a fib, MS, lymphedema, hypothyroidism, Graves Disease, arthritis, lumbar herniated disc (L5), depression and neuropathy    Examination-Activity Limitations Bed Mobility;Bend;Caring for Others;Carry;Reach Overhead;Locomotion Level;Lift;Hygiene/Grooming;Dressing;Squat;Stairs;Stand;Toileting;Transfers    Examination-Participation Restrictions Cleaning;Community Activity;Laundry;Occupation;Shop;Volunteer;Yard Work    Merchant [employer] officer Evolving/Moderate complexity    Rehab Potential Fair    PT Frequency 2x / week    PT Duration 12 weeks    PT Treatment/Interventions ADLs/Self Care Home Management;Aquatic Therapy;Canalith Repostioning;Biofeedback;Cryotherapy;Ultrasound;Traction;Moist Heat;Electrical Stimulation;DME Instruction;Gait training;Therapeutic exercise;Therapeutic activities;Functional mobility training;Stair training;Balance training;Neuromuscular re-education;Patient/family education;Manual techniques;Orthotic Fit/Training;Compression bandaging;Passive range of motion;Vestibular;Taping;Splinting;Energy conservation;Dry needling;Visual/perceptual remediation/compensation    PT Next Visit Plan Continue with progessive balance training, Progressive LE strenthening as appropriate.    PT Home Exercise Plan Access Code: [email or web address] URL: [email or web address]  Added Bridges with BTB  and encouraged more walking around the home during commerical breaks when watching TV.    Consulted and Agree with Plan of Care Patient              Ollen Bowl, PT Physical Therapist- [facility]  8:24 AM, 05/02/22

## 2022-05-02 ENCOUNTER — Other Ambulatory Visit: Payer: Self-pay

## 2022-05-02 ENCOUNTER — Telehealth: Payer: Self-pay | Admitting: Cardiology

## 2022-05-02 MED ORDER — APIXABAN 5 MG PO TABS: 5.0000 mg | ORAL_TABLET | Freq: Two times a day (BID) | ORAL | 1 refills | Status: DC

## 2022-05-02 NOTE — Telephone Encounter (Signed)
Refill request

## 2022-05-02 NOTE — Telephone Encounter (Signed)
*  STAT* If patient is at the pharmacy, call can be transferred to refill team.   1. Which medications need to be refilled? (please list name of each medication and dose if known)  apixaban (ELIQUIS) 5 MG TABS tablet   2. Which pharmacy/location (including street and city if local pharmacy) is medication to be sent to? [place], [place]    3. Do they need a 30 day or 90 day supply?  90 day

## 2022-05-02 NOTE — Telephone Encounter (Signed)
Prescription refill request for Eliquis received. Indication:afib Last office visit:9/23 Scr:0.9 Age: 67 Weight:119.7 kg  Prescription refilled

## 2022-05-02 NOTE — Telephone Encounter (Signed)
Prescription refill request for Eliquis received. Indication: PAF Last office visit: 03/23/22  Agbor-Etang MD Scr: 0.92 on 10/27/21 Age: 66 Weight: 119.7kg  Based on above findings Eliquis '5mg'$  twice daily is the appropriate dose.  Refill approved.

## 2022-05-03 ENCOUNTER — Ambulatory Visit: Payer: Medicare PPO

## 2022-05-03 DIAGNOSIS — R278 Other lack of coordination: Secondary | ICD-10-CM

## 2022-05-03 DIAGNOSIS — R2689 Other abnormalities of gait and mobility: Secondary | ICD-10-CM | POA: Diagnosis not present

## 2022-05-03 DIAGNOSIS — R269 Unspecified abnormalities of gait and mobility: Secondary | ICD-10-CM

## 2022-05-03 DIAGNOSIS — M6281 Muscle weakness (generalized): Secondary | ICD-10-CM | POA: Diagnosis not present

## 2022-05-03 DIAGNOSIS — R2681 Unsteadiness on feet: Secondary | ICD-10-CM

## 2022-05-03 DIAGNOSIS — R262 Difficulty in walking, not elsewhere classified: Secondary | ICD-10-CM

## 2022-05-03 NOTE — Therapy (Signed)
OUTPATIENT PHYSICAL THERAPY TREATMENT NOTE  Patient Name: Dustin Gray MRN: [id number] DOB:02-Mar-1956, 66 y.o., male Today's Date: 05/04/2022  PCP: [name], MD REFERRING PROVIDER: [name], MD   PT End of Session - 05/03/22 1438     Visit Number 91    Number of Visits 107    Date for PT Re-Evaluation 06/26/22    Authorization Type [employer] Auth 11/28/21-02/22/22 for 24 visits    Authorization Time Period Cert for 3/71/69-67/8/93; Recert 81/06/7508- 07/30/8525    Progress Note Due on Visit 100    PT Start Time 1435    PT Stop Time 1514    PT Time Calculation (min) 39 min    Equipment Utilized During Treatment Gait belt    Activity Tolerance Patient tolerated treatment well;No increased pain;Patient limited by fatigue    Behavior During Therapy [facility] for tasks assessed/performed                       Past Medical History:  Diagnosis Date   Abscess    groin   Arthritis    lower spine   Erectile dysfunction    Low testosterone    Lumbar herniated disc    L5   Multiple sclerosis ([place])    Staph aureus infection    Past Surgical History:  Procedure Laterality Date   CARDIOVERSION N/A 11/22/2021   Procedure: CARDIOVERSION;  Surgeon: [name], MD;  Location: [facility];  Service: Cardiovascular;  Laterality: N/A;   COLONOSCOPY WITH PROPOFOL N/A 07/04/2016   Procedure: COLONOSCOPY WITH PROPOFOL;  Surgeon: [name], MD;  Location: [place];  Service: Endoscopy;  Laterality: N/A;   [place]   POLYPECTOMY  07/04/2016   Procedure: POLYPECTOMY;  Surgeon: [name], MD;  Location: [place];  Service: Endoscopy;;   TONSILLECTOMY     Patient Active Problem List   Diagnosis Date Noted   Persistent atrial fibrillation ([place])    Graves disease    Abdominal bloating    Atrial fibrillation with RVR ([place]) 10/01/2019   Hyperthyroidism    Atrial fibrillation with rapid ventricular  response ([place]) 09/30/2019   Leg edema    Left leg cellulitis    Special screening for malignant neoplasms, colon    Polyp of sigmoid colon    Benign neoplasm of ascending colon    Rectal polyp    Herniated nucleus pulposus, L5-S1 11/09/2015   Low back pain 10/25/2015   Herniated nucleus pulposus, C5-6 right 10/06/2015   Dysesthesia 09/16/2015   Spasticity 09/16/2015   Unsteady gait 09/16/2015   Multiple sclerosis ([place]) 06/08/2011     REFERRING DIAG: R26.81 (ICD-10-CM) - Unsteadiness on feet   THERAPY DIAG:  Unsteadiness on feet  Difficulty in walking, not elsewhere classified  Abnormality of gait and mobility  Muscle weakness (generalized)  Rationale for Evaluation and Treatment Rehabilitation  PERTINENT HISTORY: Patient presents to physical therapy for unsteadiness, walking instability, fatigue. His PMH includes paroxysmal a fib, MS, lymphedema, hypothyroidism, Graves Disease, arthritis, lumbar herniated disc (L5), depression and neuropathy. Patient first developed symptoms of MS in 2005 and was diagnosed in 2006. Patient has done PT in the past, but hasn't been seen since 2020 at [facility]. Has not been doing his exercises in the past year. Walk outside to garage to ride lawnmower, walks in house. Retired Automotive engineer. Has a rollator at home but doesn't use it in the house   PRECAUTIONS: Fall  SUBJECTIVE: Patient reports back is still sore but not as bad as last visit.  PAIN:  Are you having pain? Yes. Low Back. 4/10- ache Worse with mobility, better with rest.     TODAY'S TREATMENT:     Therex:  Seated LAQ- AROM  (left LE- Partial ROM) 2 sets x 12 reps Left LE Seated hip march AROM (left LE - partial ROM) x 12 reps  Left LE. On 2nd set- Patient had to lift LE up high enough to clear a 1.25" PVC pipe Seated ham curl with GTB - 2 sets x 12 reps Seated hip march/swing over 1.25 pvc pipe - 2 sets of 12 reps Left LE   6 min walk with upright walker- 960 feet.  Patient reported feeling better with less soreness in BUE with use of upright Walker.      PATIENT EDUCATION: Education details: exercise technique, body mechanics Person educated: Patient Education method: Customer service manager, verbal cues Education comprehension: verbalized understanding, returned demonstration    HOME EXERCISE PROGRAM: No changes this session   PT Short Term Goals -       PT SHORT TERM GOAL #1   Title Patient will be independent in home exercise program to improve strength/mobility for better functional independence with ADLs.    Baseline 8/9: HEP to be given next session, 10/31: patient reports compliance with HEP and would like progressions added next session. 08/22/2021=Patient verbalized that he is walking and doing his LE exercises with no questions at this time.    Time 4    Period Weeks    Status Achieved    Target Date 08/16/21              PT Long Term Goals       PT LONG TERM GOAL #1   Title Patient will increase FOTO score to equal to or greater than 70%  to demonstrate statistically significant improvement in mobility and quality of life.    Baseline 8/9: 64%; risk adjusted 34%; 03/21/21 FOTO: 45; 04/24/21 FOTO: 49% ; 08/22/2021= 56% 4/20: 60% 11/28/21: 63%; 01/04/2022= 60%. 03/22/2022= 73%   Time 12    Period Weeks    Status Achieved   Target Date 06/26/2022     PT LONG TERM GOAL #2   Title Patient will increase Berg Balance score by > 6 points to demonstrate decreased fall risk during functional activities.    Baseline 4/20: 43/56 6/6: 48/56; 713/2023= 50/56   Time 12    Period Weeks    Status Achieved   Target Date 03/29/2022     PT LONG TERM GOAL #3   Title Patient will increase 10 meter walk test to >1.26ms as to improve gait speed for better community ambulation and to reduce fall risk.    Baseline 8/9: 0.65 m/s with rollator; 10/31: 0.68 m/s with rollator. 07/06/2021= 0.71 m/s using rollator; 08/22/2021= 0.94 m/s using  rollator. 4/20: 1.1 m/s with rollator    Time 12    Period Weeks    Status Achieved    Target Date 10/11/21      PT LONG TERM GOAL #4   Title Patient will increase six minute walk test distance to >1000 for progression to community ambulator and improve gait ability    Baseline 8/9: 505 ft with rollator; 03/21/21: [id number] rollator, 04/24/21: [id number] rollator; 07/06/2021=900 feet; 08/22/2021= 935 feet with use of Rollator 44/20: 940 ft 11/28/21: 1055 feet with rollator   Time 12    Period  Weeks    Status Achieved    Target Date 01/04/22      PT LONG TERM GOAL #5   Title Patient will increase glute medius strength on Left LE from able to lift off 3" (sidelye on mat) to > 5" to improve stability, gait mechanics, and functional strength.    Baseline 10/31: 3-/5; 07/06/2021= 3-/5; 08/22/2021=3-/5 unable to achieve full ROM yet able to withstand some resistance with testing 4/20: unable to obtainfull ROM against gravity 6/6: unable to move against gravity; 01/04/2022= Continues to have difficulty - unable to raise left LE in sidelye against gravity but able to perform supine left Hip abd with some resistance= 3-/5; 03/22/2022- Patient able to raise his left LE 3 in off mat today. 04/03/2022= Patient able to raise left LE 3" off mat in sidelye today; 05/01/2022= 05/01/2022= patient able to raise left LE from sidelye position= 5"   Time 12    Period Weeks    Status  PROGRESSING    Target Date 06/26/2022     PT LONG TERM GOAL #6   Title Patient will increase Left single leg stance time to 15 seconds or greater to increase safety in shower and independence with ADLs.    Baseline 10/31; 1.83 sec without UE support; 08/22/2021=2-3  sec  on left; 6 sec on right 4/20: RLE 10 seconds LLE unable to perform 6/6: R LE 4 seconds during BERG; 01/04/2022= left = 3 sec and right = 8 sec; 02/13/2022= 20 sec on right at best and 4 sec on left LE at best. 03/23/2022=Patient able to stand on left LE at best for 7 sec today.  04/03/2022= 27 sec on right and 12 sec on left;    Time 12    Period Weeks    Status On-going    Target Date 06/26/2022     PT LONG TERM GOAL #7   Title Patient will safely negotiate 13 steps with handrails to safely enter/exit mothers house.    Baseline 4/20: very challenging 6/6: able to copmlete safely even managing dogs per pt report; 01/04/2022- Patient performed 16 steps with B Rail-no difficulty other than fatigue.    Time 12    Period Weeks    Status Achieved   Target Date 01/04/22    PT LONG TERM GOAL #8  Title Patient will increase six minute walk test distance to >1100 ft.  for progression to community ambulator and improve gait ability   Baseline 04/03/2022= 920 feet with 4WW - (Added another goal for endurance to continue to progress community amb distances); 05/03/2022= 960 feet with upright 4WW (patient's first time using device)   Time 12   Period Weeks   Status Progressing  Target Date 06/26/2022            Plan - 12/13/21 1340     Clinical Impression Statement Patient still dealing with some low back pain but better today- able to participate in LE strengthening without any report of increased pain. He does continue to present with significant Left LE weakness but responding well progressive strengthening focusing on hip/quad/ham/gastroc strengthening. He was able to lift LE high enough to clear a 1.25 in PVC today. He performed trial of upright 4WW available in clinic today and improved his 6 min walk test. He reported he liked this walker and feels it will help him with overall endurance without the UE soresness associated with use of normal 4WW with weight bearing through his forearms on new walker vs. Hands  on regular 4WW. Pt will continue to benefit from skilled PT interventions to improve his overall strength, mobility and quality of life.    Personal Factors and Comorbidities Age;Comorbidity 3+;Fitness;Past/Current Experience;Time since onset of  injury/illness/exacerbation    Comorbidities aroxysmal a fib, MS, lymphedema, hypothyroidism, Graves Disease, arthritis, lumbar herniated disc (L5), depression and neuropathy    Examination-Activity Limitations Bed Mobility;Bend;Caring for Others;Carry;Reach Overhead;Locomotion Level;Lift;Hygiene/Grooming;Dressing;Squat;Stairs;Stand;Toileting;Transfers    Examination-Participation Restrictions Cleaning;Community Activity;Laundry;Occupation;Shop;Volunteer;Yard Work    Merchant navy officer Evolving/Moderate complexity    Rehab Potential Fair    PT Frequency 2x / week    PT Duration 12 weeks    PT Treatment/Interventions ADLs/Self Care Home Management;Aquatic Therapy;Canalith Repostioning;Biofeedback;Cryotherapy;Ultrasound;Traction;Moist Heat;Electrical Stimulation;DME Instruction;Gait training;Therapeutic exercise;Therapeutic activities;Functional mobility training;Stair training;Balance training;Neuromuscular re-education;Patient/family education;Manual techniques;Orthotic Fit/Training;Compression bandaging;Passive range of motion;Vestibular;Taping;Splinting;Energy conservation;Dry needling;Visual/perceptual remediation/compensation    PT Next Visit Plan Continue with progessive balance training, Progressive LE strenthening as appropriate.    PT Home Exercise Plan Access Code: [email or web address] URL: [email or web address]  Added Bridges with BTB and encouraged more walking around the home during commerical breaks when watching TV.    Consulted and Agree with Plan of Care Patient              Ollen Bowl, PT Physical Therapist- [facility]  6:26 AM, 05/04/22

## 2022-05-08 ENCOUNTER — Ambulatory Visit: Payer: Medicare PPO

## 2022-05-08 DIAGNOSIS — R262 Difficulty in walking, not elsewhere classified: Secondary | ICD-10-CM | POA: Diagnosis not present

## 2022-05-08 DIAGNOSIS — R269 Unspecified abnormalities of gait and mobility: Secondary | ICD-10-CM

## 2022-05-08 DIAGNOSIS — R2689 Other abnormalities of gait and mobility: Secondary | ICD-10-CM | POA: Diagnosis not present

## 2022-05-08 DIAGNOSIS — R2681 Unsteadiness on feet: Secondary | ICD-10-CM

## 2022-05-08 DIAGNOSIS — R278 Other lack of coordination: Secondary | ICD-10-CM

## 2022-05-08 DIAGNOSIS — M6281 Muscle weakness (generalized): Secondary | ICD-10-CM | POA: Diagnosis not present

## 2022-05-08 NOTE — Therapy (Signed)
OUTPATIENT PHYSICAL THERAPY TREATMENT NOTE  Patient Name: Dustin Gray MRN: [id number] DOB:1956/05/04, 66 y.o., male Today's Date: 05/08/2022  PCP: [name], MD REFERRING PROVIDER: [name], MD   PT End of Session - 05/08/22 1442     Visit Number 92    Number of Visits 107    Date for PT Re-Evaluation 06/26/22    Authorization Type [employer] Auth 11/28/21-02/22/22 for 24 visits    Authorization Time Period Cert for 0/99/83-38/2/50; Recert 53/97/6734- 07/03/3788    Progress Note Due on Visit 100    PT Start Time 1433    PT Stop Time 1516    PT Time Calculation (min) 43 min    Equipment Utilized During Treatment Gait belt    Activity Tolerance Patient tolerated treatment well;No increased pain;Patient limited by fatigue    Behavior During Therapy [facility] for tasks assessed/performed                       Past Medical History:  Diagnosis Date   Abscess    groin   Arthritis    lower spine   Erectile dysfunction    Low testosterone    Lumbar herniated disc    L5   Multiple sclerosis ([place])    Staph aureus infection    Past Surgical History:  Procedure Laterality Date   CARDIOVERSION N/A 11/22/2021   Procedure: CARDIOVERSION;  Surgeon: [name], MD;  Location: [facility];  Service: Cardiovascular;  Laterality: N/A;   COLONOSCOPY WITH PROPOFOL N/A 07/04/2016   Procedure: COLONOSCOPY WITH PROPOFOL;  Surgeon: [name], MD;  Location: [place];  Service: Endoscopy;  Laterality: N/A;   [place]   POLYPECTOMY  07/04/2016   Procedure: POLYPECTOMY;  Surgeon: [name], MD;  Location: [place];  Service: Endoscopy;;   TONSILLECTOMY     Patient Active Problem List   Diagnosis Date Noted   Persistent atrial fibrillation ([place])    Graves disease    Abdominal bloating    Atrial fibrillation with RVR ([place]) 10/01/2019   Hyperthyroidism    Atrial fibrillation with rapid ventricular  response ([place]) 09/30/2019   Leg edema    Left leg cellulitis    Special screening for malignant neoplasms, colon    Polyp of sigmoid colon    Benign neoplasm of ascending colon    Rectal polyp    Herniated nucleus pulposus, L5-S1 11/09/2015   Low back pain 10/25/2015   Herniated nucleus pulposus, C5-6 right 10/06/2015   Dysesthesia 09/16/2015   Spasticity 09/16/2015   Unsteady gait 09/16/2015   Multiple sclerosis ([place]) 06/08/2011     REFERRING DIAG: R26.81 (ICD-10-CM) - Unsteadiness on feet   THERAPY DIAG:  Unsteadiness on feet  Difficulty in walking, not elsewhere classified  Abnormality of gait and mobility  Muscle weakness (generalized)  Rationale for Evaluation and Treatment Rehabilitation  PERTINENT HISTORY: Patient presents to physical therapy for unsteadiness, walking instability, fatigue. His PMH includes paroxysmal a fib, MS, lymphedema, hypothyroidism, Graves Disease, arthritis, lumbar herniated disc (L5), depression and neuropathy. Patient first developed symptoms of MS in 2005 and was diagnosed in 2006. Patient has done PT in the past, but hasn't been seen since 2020 at [facility]. Has not been doing his exercises in the past year. Walk outside to garage to ride lawnmower, walks in house. Retired Automotive engineer. Has a rollator at home but doesn't use it in the house   PRECAUTIONS: Fall  SUBJECTIVE: Patient reports back is still sore but not as bad as last visit.  PAIN:  Are you having pain? Yes. Low Back. 4/10- ache Worse with mobility, better with rest.     TODAY'S TREATMENT:   Well zone exercises:    Knee ext-  Level 2 x  12 reps Left LE;  Level 2 Right LE x 12 reps Level 3 x 10 reps Left LE:  Level 3 Right LE x 10 reps Level 3 x 8 reps Left LE; level 4 Right LE x 8 reps Level 3 x 6 reps Left  LE; Level 5 Right LE  x 6 reps  Level 3 x 4 reps Left LE; level  6 right LE x 4 reps  Level 4 x 2 reps Left LE; Level  7 right LE x 2 reps   Knee flex:    Level 2 Left LE x 12; Level 3 x 12 with right LE Level 3 Left LE x 10  reps; Level  4 x 10 reps with right LE Level  4 Left LE x 8 reps; Level 5 x 8 reps with right LE Level 5 left LE x 6 reps ; Level 6 x 6 reps with right LE Level 5 Left LE x 4 reps; Level 7 x 4 reps right LE Level 6 Left LE x 2 reps (partial ROM); Level 8 x 2 reps right LE     Standing Hip Abd in main gym (matrix cable)- 7.5 # 2 Sets of 12 reps each LE  PATIENT EDUCATION: Education details: exercise technique, body mechanics Person educated: Patient Education method: Customer service manager, verbal cues Education comprehension: verbalized understanding, returned demonstration    HOME EXERCISE PROGRAM: No changes this session   PT Short Term Goals -       PT SHORT TERM GOAL #1   Title Patient will be independent in home exercise program to improve strength/mobility for better functional independence with ADLs.    Baseline 8/9: HEP to be given next session, 10/31: patient reports compliance with HEP and would like progressions added next session. 08/22/2021=Patient verbalized that he is walking and doing his LE exercises with no questions at this time.    Time 4    Period Weeks    Status Achieved    Target Date 08/16/21              PT Long Term Goals       PT LONG TERM GOAL #1   Title Patient will increase FOTO score to equal to or greater than 70%  to demonstrate statistically significant improvement in mobility and quality of life.    Baseline 8/9: 64%; risk adjusted 34%; 03/21/21 FOTO: 45; 04/24/21 FOTO: 49% ; 08/22/2021= 56% 4/20: 60% 11/28/21: 63%; 01/04/2022= 60%. 03/22/2022= 73%   Time 12    Period Weeks    Status Achieved   Target Date 06/26/2022     PT LONG TERM GOAL #2   Title Patient will increase Berg Balance score by > 6 points to demonstrate decreased fall risk during functional activities.    Baseline 4/20: 43/56 6/6: 48/56; 713/2023= 50/56   Time 12    Period Weeks    Status  Achieved   Target Date 03/29/2022     PT LONG TERM GOAL #3   Title Patient will increase 10 meter walk test to >1.60ms as to improve gait speed for better community ambulation and to reduce fall risk.    Baseline 8/9: 0.65 m/s with  rollator; 10/31: 0.68 m/s with rollator. 07/06/2021= 0.71 m/s using rollator; 08/22/2021= 0.94 m/s using rollator. 4/20: 1.1 m/s with rollator    Time 12    Period Weeks    Status Achieved    Target Date 10/11/21      PT LONG TERM GOAL #4   Title Patient will increase six minute walk test distance to >1000 for progression to community ambulator and improve gait ability    Baseline 8/9: 505 ft with rollator; 03/21/21: 739f c rollator, 04/24/21: 7583fc rollator; 07/06/2021=900 feet; 08/22/2021= 935 feet with use of Rollator 44/20: 940 ft 11/28/21: 1055 feet with rollator   Time 12    Period Weeks    Status Achieved    Target Date 01/04/22      PT LONG TERM GOAL #5   Title Patient will increase glute medius strength on Left LE from able to lift off 3" (sidelye on mat) to > 5" to improve stability, gait mechanics, and functional strength.    Baseline 10/31: 3-/5; 07/06/2021= 3-/5; 08/22/2021=3-/5 unable to achieve full ROM yet able to withstand some resistance with testing 4/20: unable to obtainfull ROM against gravity 6/6: unable to move against gravity; 01/04/2022= Continues to have difficulty - unable to raise left LE in sidelye against gravity but able to perform supine left Hip abd with some resistance= 3-/5; 03/22/2022- Patient able to raise his left LE 3 in off mat today. 04/03/2022= Patient able to raise left LE 3" off mat in sidelye today; 05/01/2022= 05/01/2022= patient able to raise left LE from sidelye position= 5"   Time 12    Period Weeks    Status  PROGRESSING    Target Date 06/26/2022     PT LONG TERM GOAL #6   Title Patient will increase Left single leg stance time to 15 seconds or greater to increase safety in shower and independence with ADLs.    Baseline  10/31; 1.83 sec without UE support; 08/22/2021=2-3  sec  on left; 6 sec on right 4/20: RLE 10 seconds LLE unable to perform 6/6: R LE 4 seconds during BERG; 01/04/2022= left = 3 sec and right = 8 sec; 02/13/2022= 20 sec on right at best and 4 sec on left LE at best. 03/23/2022=Patient able to stand on left LE at best for 7 sec today. 04/03/2022= 27 sec on right and 12 sec on left;    Time 12    Period Weeks    Status On-going    Target Date 06/26/2022     PT LONG TERM GOAL #7   Title Patient will safely negotiate 13 steps with handrails to safely enter/exit mothers house.    Baseline 4/20: very challenging 6/6: able to copmlete safely even managing dogs per pt report; 01/04/2022- Patient performed 16 steps with B Rail-no difficulty other than fatigue.    Time 12    Period Weeks    Status Achieved   Target Date 01/04/22    PT LONG TERM GOAL #8  Title Patient will increase six minute walk test distance to >1100 ft.  for progression to community ambulator and improve gait ability   Baseline 04/03/2022= 920 feet with 4WW - (Added another goal for endurance to continue to progress community amb distances); 05/03/2022= 960 feet with upright 4WW (patient's first time using device)   Time 12   Period Weeks   Status Progressing  Target Date 06/26/2022            Plan - 12/13/21 1340  Clinical Impression Statement Patient presented with good motivation for today's visit. He was having slight more difficulty with knee ext strength yet performed more with ham curl and able to perform some standing Hip abd with good ROM and even some eccentric control on left LE.  Pt will continue to benefit from skilled PT interventions to improve his overall strength, mobility and quality of life.    Personal Factors and Comorbidities Age;Comorbidity 3+;Fitness;Past/Current Experience;Time since onset of injury/illness/exacerbation    Comorbidities aroxysmal a fib, MS, lymphedema, hypothyroidism, Graves Disease,  arthritis, lumbar herniated disc (L5), depression and neuropathy    Examination-Activity Limitations Bed Mobility;Bend;Caring for Others;Carry;Reach Overhead;Locomotion Level;Lift;Hygiene/Grooming;Dressing;Squat;Stairs;Stand;Toileting;Transfers    Examination-Participation Restrictions Cleaning;Community Activity;Laundry;Occupation;Shop;Volunteer;Yard Work    Merchant [employer] officer Evolving/Moderate complexity    Rehab Potential Fair    PT Frequency 2x / week    PT Duration 12 weeks    PT Treatment/Interventions ADLs/Self Care Home Management;Aquatic Therapy;Canalith Repostioning;Biofeedback;Cryotherapy;Ultrasound;Traction;Moist Heat;Electrical Stimulation;DME Instruction;Gait training;Therapeutic exercise;Therapeutic activities;Functional mobility training;Stair training;Balance training;Neuromuscular re-education;Patient/family education;Manual techniques;Orthotic Fit/Training;Compression bandaging;Passive range of motion;Vestibular;Taping;Splinting;Energy conservation;Dry needling;Visual/perceptual remediation/compensation    PT Next Visit Plan Continue with progessive balance training, Progressive LE strenthening as appropriate.    PT Home Exercise Plan Access Code: [email or web address] URL: [email or web address]  Added Bridges with BTB and encouraged more walking around the home during commerical breaks when watching TV.    Consulted and Agree with Plan of Care Patient              Ollen Bowl, PT Physical Therapist- [facility]  3:52 PM, 05/08/22

## 2022-05-10 ENCOUNTER — Ambulatory Visit: Payer: Medicare PPO

## 2022-05-10 ENCOUNTER — Ambulatory Visit: Payer: Medicare PPO | Admitting: Occupational Therapy

## 2022-05-15 ENCOUNTER — Ambulatory Visit: Payer: Medicare PPO

## 2022-05-15 DIAGNOSIS — R278 Other lack of coordination: Secondary | ICD-10-CM

## 2022-05-15 DIAGNOSIS — R2689 Other abnormalities of gait and mobility: Secondary | ICD-10-CM | POA: Diagnosis not present

## 2022-05-15 DIAGNOSIS — M6281 Muscle weakness (generalized): Secondary | ICD-10-CM | POA: Diagnosis not present

## 2022-05-15 DIAGNOSIS — R262 Difficulty in walking, not elsewhere classified: Secondary | ICD-10-CM | POA: Diagnosis not present

## 2022-05-15 DIAGNOSIS — R269 Unspecified abnormalities of gait and mobility: Secondary | ICD-10-CM | POA: Diagnosis not present

## 2022-05-15 DIAGNOSIS — R2681 Unsteadiness on feet: Secondary | ICD-10-CM | POA: Diagnosis not present

## 2022-05-15 NOTE — Therapy (Signed)
OUTPATIENT PHYSICAL THERAPY TREATMENT NOTE  Patient Name: Dustin Gray MRN: [id number] DOB:07/04/1955, 66 y.o., male Today's Date: 05/15/2022  PCP: [name], MD REFERRING PROVIDER: [name], MD   PT End of Session - 05/15/22 1437     Visit Number 93    Number of Visits 107    Date for PT Re-Evaluation 06/26/22    Authorization Type [employer] Auth 11/28/21-02/22/22 for 24 visits    Authorization Time Period Cert for 7/34/19-37/9/02; Recert 40/97/3532- 03/03/2425    Progress Note Due on Visit 100    PT Start Time 1435    PT Stop Time 1514    PT Time Calculation (min) 39 min    Equipment Utilized During Treatment Gait belt    Activity Tolerance Patient tolerated treatment well;No increased pain;Patient limited by fatigue    Behavior During Therapy [facility] for tasks assessed/performed                        Past Medical History:  Diagnosis Date   Abscess    groin   Arthritis    lower spine   Erectile dysfunction    Low testosterone    Lumbar herniated disc    L5   Multiple sclerosis ([place])    Staph aureus infection    Past Surgical History:  Procedure Laterality Date   CARDIOVERSION N/A 11/22/2021   Procedure: CARDIOVERSION;  Surgeon: [name], MD;  Location: [facility];  Service: Cardiovascular;  Laterality: N/A;   COLONOSCOPY WITH PROPOFOL N/A 07/04/2016   Procedure: COLONOSCOPY WITH PROPOFOL;  Surgeon: [name], MD;  Location: [place];  Service: Endoscopy;  Laterality: N/A;   [place]   POLYPECTOMY  07/04/2016   Procedure: POLYPECTOMY;  Surgeon: [name], MD;  Location: [place];  Service: Endoscopy;;   TONSILLECTOMY     Patient Active Problem List   Diagnosis Date Noted   Persistent atrial fibrillation ([place])    Graves disease    Abdominal bloating    Atrial fibrillation with RVR ([place]) 10/01/2019   Hyperthyroidism    Atrial fibrillation with rapid ventricular  response ([place]) 09/30/2019   Leg edema    Left leg cellulitis    Special screening for malignant neoplasms, colon    Polyp of sigmoid colon    Benign neoplasm of ascending colon    Rectal polyp    Herniated nucleus pulposus, L5-S1 11/09/2015   Low back pain 10/25/2015   Herniated nucleus pulposus, C5-6 right 10/06/2015   Dysesthesia 09/16/2015   Spasticity 09/16/2015   Unsteady gait 09/16/2015   Multiple sclerosis ([place]) 06/08/2011     REFERRING DIAG: R26.81 (ICD-10-CM) - Unsteadiness on feet   THERAPY DIAG:  Unsteadiness on feet  Difficulty in walking, not elsewhere classified  Abnormality of gait and mobility  Muscle weakness (generalized)  Rationale for Evaluation and Treatment Rehabilitation  PERTINENT HISTORY: Patient presents to physical therapy for unsteadiness, walking instability, fatigue. His PMH includes paroxysmal a fib, MS, lymphedema, hypothyroidism, Graves Disease, arthritis, lumbar herniated disc (L5), depression and neuropathy. Patient first developed symptoms of MS in 2005 and was diagnosed in 2006. Patient has done PT in the past, but hasn't been seen since 2020 at [facility]. Has not been doing his exercises in the past year. Walk outside to garage to ride lawnmower, walks in house. 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  PAIN:  Are you having pain? Yes. Low Back. 4/10- ache Worse with mobility, better with rest.     TODAY'S TREATMENT:   Therex:  Seated hip march 2.5 # AW and 5# AW x 12 reps with 3 sec hold- each LE Seated knee ext 2.5# AW and 5# AW x12 reps with 3 sec hold each LE Seated leg lift (hip/knee at 90 deg) up and over 1 in pvc pipe - 2 sets of 6 reps on left with 2.5# and 5# AW on Right over a 2 lb dumbbell.  Standing Terminal Knee extension with BTB x 15 reps each LE Heel to toe sequencing  (5# on right and 2.5# on left)  Side swing leg to touch incline at varying angles- Lowest setting then raises by 2 notches for total of 3 sets 10-12 rep each LE Sit to stand x 12 reps without UE support   PATIENT EDUCATION: Education details: exercise technique, body mechanics Person educated: Patient Education method: Customer service manager, verbal cues Education comprehension: verbalized understanding, returned demonstration    HOME EXERCISE PROGRAM: No changes this session   PT Short Term Goals -       PT SHORT TERM GOAL #1   Title Patient will be independent in home exercise program to improve strength/mobility for better functional independence with ADLs.    Baseline 8/9: HEP to be given next session, 10/31: patient reports compliance with HEP and would like progressions added next session. 08/22/2021=Patient verbalized that he is walking and doing his LE exercises with no questions at this time.    Time 4    Period Weeks    Status Achieved    Target Date 08/16/21              PT Long Term Goals       PT LONG TERM GOAL #1   Title Patient will increase FOTO score to equal to or greater than 70%  to demonstrate statistically significant improvement in mobility and quality of life.    Baseline 8/9: 64%; risk adjusted 34%; 03/21/21 FOTO: 45; 04/24/21 FOTO: 49% ; 08/22/2021= 56% 4/20: 60% 11/28/21: 63%; 01/04/2022= 60%. 03/22/2022= 73%   Time 12    Period Weeks    Status Achieved   Target Date 06/26/2022     PT LONG TERM GOAL #2   Title Patient will increase Berg Balance score by > 6 points to demonstrate decreased fall risk during functional activities.    Baseline 4/20: 43/56 6/6: 48/56; 713/2023= 50/56   Time 12    Period Weeks    Status Achieved   Target Date 03/29/2022     PT LONG TERM GOAL #3   Title Patient will increase 10 meter walk test to >1.106ms as to improve gait speed for better community ambulation and to reduce fall risk.    Baseline 8/9: 0.65 m/s  with rollator; 10/31: 0.68 m/s with rollator. 07/06/2021= 0.71 m/s using rollator; 08/22/2021= 0.94 m/s using rollator. 4/20: 1.1 m/s with rollator    Time 12    Period Weeks    Status Achieved    Target Date 10/11/21      PT LONG TERM GOAL #4   Title Patient will increase six minute walk test distance to >1000 for progression to community ambulator and improve gait ability    Baseline 8/9: 505 ft with rollator; 03/21/21: 7730fc rollator, 04/24/21: 75565f  rollator; 07/06/2021=900 feet; 08/22/2021= 935 feet with use of Rollator 44/20: 940 ft 11/28/21: 1055 feet with rollator   Time 12    Period Weeks    Status Achieved    Target Date 01/04/22      PT LONG TERM GOAL #5   Title Patient will increase glute medius strength on Left LE from able to lift off 3" (sidelye on mat) to > 5" to improve stability, gait mechanics, and functional strength.    Baseline 10/31: 3-/5; 07/06/2021= 3-/5; 08/22/2021=3-/5 unable to achieve full ROM yet able to withstand some resistance with testing 4/20: unable to obtainfull ROM against gravity 6/6: unable to move against gravity; 01/04/2022= Continues to have difficulty - unable to raise left LE in sidelye against gravity but able to perform supine left Hip abd with some resistance= 3-/5; 03/22/2022- Patient able to raise his left LE 3 in off mat today. 04/03/2022= Patient able to raise left LE 3" off mat in sidelye today; 05/01/2022= 05/01/2022= patient able to raise left LE from sidelye position= 5"   Time 12    Period Weeks    Status  PROGRESSING    Target Date 06/26/2022     PT LONG TERM GOAL #6   Title Patient will increase Left single leg stance time to 15 seconds or greater to increase safety in shower and independence with ADLs.    Baseline 10/31; 1.83 sec without UE support; 08/22/2021=2-3  sec  on left; 6 sec on right 4/20: RLE 10 seconds LLE unable to perform 6/6: R LE 4 seconds during BERG; 01/04/2022= left = 3 sec and right = 8 sec; 02/13/2022= 20 sec on right at best  and 4 sec on left LE at best. 03/23/2022=Patient able to stand on left LE at best for 7 sec today. 04/03/2022= 27 sec on right and 12 sec on left;    Time 12    Period Weeks    Status On-going    Target Date 06/26/2022     PT LONG TERM GOAL #7   Title Patient will safely negotiate 13 steps with handrails to safely enter/exit mothers house.    Baseline 4/20: very challenging 6/6: able to copmlete safely even managing dogs per pt report; 01/04/2022- Patient performed 16 steps with B Rail-no difficulty other than fatigue.    Time 12    Period Weeks    Status Achieved   Target Date 01/04/22    PT LONG TERM GOAL #8  Title Patient will increase six minute walk test distance to >1100 ft.  for progression to community ambulator and improve gait ability   Baseline 04/03/2022= 920 feet with 4WW - (Added another goal for endurance to continue to progress community amb distances); 05/03/2022= 960 feet with upright 4WW (patient's first time using device)   Time 12   Period Weeks   Status Progressing  Target Date 06/26/2022            Plan - 12/13/21 1340     Clinical Impression Statement Patient able to perform all therex today without any complaint of increased low back pain. He was able to perform left LE with 2.5 # resistance with good effort yet  ongoing knee hyperextension. He focused on trying to control his knee ext with improved ability. Pt will continue to benefit from skilled PT interventions to improve his overall strength, mobility and quality of life.    Personal Factors and Comorbidities Age;Comorbidity 3+;Fitness;Past/Current Experience;Time since onset of injury/illness/exacerbation    Comorbidities aroxysmal  a fib, MS, lymphedema, hypothyroidism, Graves Disease, arthritis, lumbar herniated disc (L5), depression and neuropathy    Examination-Activity Limitations Bed Mobility;Bend;Caring for Others;Carry;Reach Overhead;Locomotion  Level;Lift;Hygiene/Grooming;Dressing;Squat;Stairs;Stand;Toileting;Transfers    Examination-Participation Restrictions Cleaning;Community Activity;Laundry;Occupation;Shop;Volunteer;Yard Work    Merchant navy officer Evolving/Moderate complexity    Rehab Potential Fair    PT Frequency 2x / week    PT Duration 12 weeks    PT Treatment/Interventions ADLs/Self Care Home Management;Aquatic Therapy;Canalith Repostioning;Biofeedback;Cryotherapy;Ultrasound;Traction;Moist Heat;Electrical Stimulation;DME Instruction;Gait training;Therapeutic exercise;Therapeutic activities;Functional mobility training;Stair training;Balance training;Neuromuscular re-education;Patient/family education;Manual techniques;Orthotic Fit/Training;Compression bandaging;Passive range of motion;Vestibular;Taping;Splinting;Energy conservation;Dry needling;Visual/perceptual remediation/compensation    PT Next Visit Plan Continue with progessive balance training, Progressive LE strenthening as appropriate.    PT Home Exercise Plan Access Code: [email or web address] URL: [email or web address]  Added Bridges with BTB and encouraged more walking around the home during commerical breaks when watching TV.    Consulted and Agree with Plan of Care Patient              Ollen Bowl, PT Physical Therapist- [facility]  4:04 PM, 05/15/22

## 2022-05-21 DIAGNOSIS — G35 Multiple sclerosis: Secondary | ICD-10-CM | POA: Diagnosis not present

## 2022-05-21 DIAGNOSIS — Z79899 Other long term (current) drug therapy: Secondary | ICD-10-CM | POA: Diagnosis not present

## 2022-05-22 ENCOUNTER — Ambulatory Visit: Payer: Medicare PPO

## 2022-05-22 DIAGNOSIS — M6281 Muscle weakness (generalized): Secondary | ICD-10-CM

## 2022-05-22 DIAGNOSIS — R269 Unspecified abnormalities of gait and mobility: Secondary | ICD-10-CM

## 2022-05-22 DIAGNOSIS — R2689 Other abnormalities of gait and mobility: Secondary | ICD-10-CM

## 2022-05-22 DIAGNOSIS — R2681 Unsteadiness on feet: Secondary | ICD-10-CM

## 2022-05-22 DIAGNOSIS — R262 Difficulty in walking, not elsewhere classified: Secondary | ICD-10-CM | POA: Diagnosis not present

## 2022-05-22 DIAGNOSIS — R278 Other lack of coordination: Secondary | ICD-10-CM | POA: Diagnosis not present

## 2022-05-22 NOTE — Therapy (Signed)
OUTPATIENT PHYSICAL THERAPY TREATMENT NOTE  Patient Name: Dustin Gray MRN: [id number] DOB:[birth year], 66 y.o., male Today's Date: 05/22/2022  PCP: [name], MD REFERRING PROVIDER: [name], MD   PT End of Session - 05/22/22 1447     Visit Number 94    Number of Visits 107    Date for PT Re-Evaluation 06/26/22    Authorization Type [employer] Auth 11/28/21-02/22/22 for 24 visits    Authorization Time Period Cert for 12/17/61-89/3/73; Recert 42/87/6811- 10/30/2618    Progress Note Due on Visit 100    PT Start Time 1438    PT Stop Time 1512    PT Time Calculation (min) 34 min    Equipment Utilized During Treatment Gait belt    Activity Tolerance Patient tolerated treatment well;No increased pain;Patient limited by fatigue    Behavior During Therapy [facility] for tasks assessed/performed                        Past Medical History:  Diagnosis Date   Abscess    groin   Arthritis    lower spine   Erectile dysfunction    Low testosterone    Lumbar herniated disc    L5   Multiple sclerosis ([place])    Staph aureus infection    Past Surgical History:  Procedure Laterality Date   CARDIOVERSION N/A 11/22/2021   Procedure: CARDIOVERSION;  Surgeon: [name], MD;  Location: [facility];  Service: Cardiovascular;  Laterality: N/A;   COLONOSCOPY WITH PROPOFOL N/A 07/04/2016   Procedure: COLONOSCOPY WITH PROPOFOL;  Surgeon: [name], MD;  Location: [place];  Service: Endoscopy;  Laterality: N/A;   [place]   POLYPECTOMY  07/04/2016   Procedure: POLYPECTOMY;  Surgeon: [name], MD;  Location: [place];  Service: Endoscopy;;   TONSILLECTOMY     Patient Active Problem List   Diagnosis Date Noted   Persistent atrial fibrillation ([place])    Graves disease    Abdominal bloating    Atrial fibrillation with RVR ([place]) 10/01/2019   Hyperthyroidism    Atrial fibrillation with rapid ventricular  response ([place]) 09/30/2019   Leg edema    Left leg cellulitis    Special screening for malignant neoplasms, colon    Polyp of sigmoid colon    Benign neoplasm of ascending colon    Rectal polyp    Herniated nucleus pulposus, L5-S1 11/09/2015   Low back pain 10/25/2015   Herniated nucleus pulposus, C5-6 right 10/06/2015   Dysesthesia 09/16/2015   Spasticity 09/16/2015   Unsteady gait 09/16/2015   Multiple sclerosis ([place]) 06/08/2011     REFERRING DIAG: R26.81 (ICD-10-CM) - Unsteadiness on feet   THERAPY DIAG:  Unsteadiness on feet  Difficulty in walking, not elsewhere classified  Abnormality of gait and mobility  Muscle weakness (generalized)  Rationale for Evaluation and Treatment Rehabilitation  PERTINENT HISTORY: Patient presents to physical therapy for unsteadiness, walking instability, fatigue. His PMH includes paroxysmal a fib, MS, lymphedema, hypothyroidism, Graves Disease, arthritis, lumbar herniated disc (L5), depression and neuropathy. Patient first developed symptoms of MS in 2005 and was diagnosed in 2006. Patient has done PT in the past, but hasn't been seen since 2020 at [facility]. Has not been doing his exercises in the past year. Walk outside to garage to ride lawnmower, walks in house. 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  PAIN:  Are you having pain? Yes. Low Back. 4/10- ache Worse with mobility, better with rest.     TODAY'S TREATMENT:   Therex:   Side step lunge squat- x 15 reps each side Forward lunge squat onto BOSU x15 reps Narrowed stance then later- Staggered stand on airex pad while arranging magnetic letters on dry erase board  Side step reach overhead diagonal x 15 reps  Sit to stand with Right LE/foot on airex pad to emphasize weight bearing onto left side x 10 reps.  Patient ambulated approx  110 feet with 4WW with Supervision with good reciprocal steps and minimal UE support on handle of walker.       PATIENT EDUCATION: Education details: exercise technique, body mechanics Person educated: Patient Education method: Customer service manager, verbal cues Education comprehension: verbalized understanding, returned demonstration    HOME EXERCISE PROGRAM: No changes this session   PT Short Term Goals -       PT SHORT TERM GOAL #1   Title Patient will be independent in home exercise program to improve strength/mobility for better functional independence with ADLs.    Baseline 8/9: HEP to be given next session, 10/31: patient reports compliance with HEP and would like progressions added next session. 08/22/2021=Patient verbalized that he is walking and doing his LE exercises with no questions at this time.    Time 4    Period Weeks    Status Achieved    Target Date 08/16/21              PT Long Term Goals       PT LONG TERM GOAL #1   Title Patient will increase FOTO score to equal to or greater than 70%  to demonstrate statistically significant improvement in mobility and quality of life.    Baseline 8/9: 64%; risk adjusted 34%; 03/21/21 FOTO: 45; 04/24/21 FOTO: 49% ; 08/22/2021= 56% 4/20: 60% 11/28/21: 63%; 01/04/2022= 60%. 03/22/2022= 73%   Time 12    Period Weeks    Status Achieved   Target Date 06/26/2022     PT LONG TERM GOAL #2   Title Patient will increase Berg Balance score by > 6 points to demonstrate decreased fall risk during functional activities.    Baseline 4/20: 43/56 6/6: 48/56; 713/2023= 50/56   Time 12    Period Weeks    Status Achieved   Target Date 03/29/2022     PT LONG TERM GOAL #3   Title Patient will increase 10 meter walk test to >1.82ms as to improve gait speed for better community ambulation and to reduce fall risk.    Baseline 8/9: 0.65 m/s with rollator; 10/31: 0.68 m/s with rollator. 07/06/2021= 0.71 m/s using rollator; 08/22/2021=  0.94 m/s using rollator. 4/20: 1.1 m/s with rollator    Time 12    Period Weeks    Status Achieved    Target Date 10/11/21      PT LONG TERM GOAL #4   Title Patient will increase six minute walk test distance to >1000 for progression to community ambulator and improve gait ability    Baseline 8/9: 505 ft with rollator; 03/21/21: 7755fc rollator, 04/24/21: 75571f rollator; 07/06/2021=900 feet; 08/22/2021= 935 feet with use of Rollator 44/20: 940 ft 11/28/21: 1055 feet with rollator   Time 12    Period Weeks    Status Achieved    Target Date  01/04/22      PT LONG TERM GOAL #5   Title Patient will increase glute medius strength on Left LE from able to lift off 3" (sidelye on mat) to > 5" to improve stability, gait mechanics, and functional strength.    Baseline 10/31: 3-/5; 07/06/2021= 3-/5; 08/22/2021=3-/5 unable to achieve full ROM yet able to withstand some resistance with testing 4/20: unable to obtainfull ROM against gravity 6/6: unable to move against gravity; 01/04/2022= Continues to have difficulty - unable to raise left LE in sidelye against gravity but able to perform supine left Hip abd with some resistance= 3-/5; 03/22/2022- Patient able to raise his left LE 3 in off mat today. 04/03/2022= Patient able to raise left LE 3" off mat in sidelye today; 05/01/2022= 05/01/2022= patient able to raise left LE from sidelye position= 5"   Time 12    Period Weeks    Status  PROGRESSING    Target Date 06/26/2022     PT LONG TERM GOAL #6   Title Patient will increase Left single leg stance time to 15 seconds or greater to increase safety in shower and independence with ADLs.    Baseline 10/31; 1.83 sec without UE support; 08/22/2021=2-3  sec  on left; 6 sec on right 4/20: RLE 10 seconds LLE unable to perform 6/6: R LE 4 seconds during BERG; 01/04/2022= left = 3 sec and right = 8 sec; 02/13/2022= 20 sec on right at best and 4 sec on left LE at best. 03/23/2022=Patient able to stand on left LE at best for 7 sec  today. 04/03/2022= 27 sec on right and 12 sec on left;    Time 12    Period Weeks    Status On-going    Target Date 06/26/2022     PT LONG TERM GOAL #7   Title Patient will safely negotiate 13 steps with handrails to safely enter/exit mothers house.    Baseline 4/20: very challenging 6/6: able to copmlete safely even managing dogs per pt report; 01/04/2022- Patient performed 16 steps with B Rail-no difficulty other than fatigue.    Time 12    Period Weeks    Status Achieved   Target Date 01/04/22    PT LONG TERM GOAL #8  Title Patient will increase six minute walk test distance to >1100 ft.  for progression to community ambulator and improve gait ability   Baseline 04/03/2022= 920 feet with 4WW - (Added another goal for endurance to continue to progress community amb distances); 05/03/2022= 960 feet with upright 4WW (patient's first time using device)   Time 12   Period Weeks   Status Progressing  Target Date 06/26/2022            Plan - 12/13/21 1340     Clinical Impression Statement Patient performed well with standing therex - able to lunge squat laterally and forward well today without failure. He was able to weight shift well to left side with no buckling of left knee.  Pt will continue to benefit from skilled PT interventions to improve his overall strength, mobility and quality of life.    Personal Factors and Comorbidities Age;Comorbidity 3+;Fitness;Past/Current Experience;Time since onset of injury/illness/exacerbation    Comorbidities aroxysmal a fib, MS, lymphedema, hypothyroidism, Graves Disease, arthritis, lumbar herniated disc (L5), depression and neuropathy    Examination-Activity Limitations Bed Mobility;Bend;Caring for Others;Carry;Reach Overhead;Locomotion Level;Lift;Hygiene/Grooming;Dressing;Squat;Stairs;Stand;Toileting;Transfers    Examination-Participation Restrictions Cleaning;Community Activity;Laundry;Occupation;Shop;Volunteer;Yard Work    Engineer, civil (consulting)  Fair    PT Frequency 2x / week    PT Duration 12 weeks    PT Treatment/Interventions ADLs/Self Care Home Management;Aquatic Therapy;Canalith Repostioning;Biofeedback;Cryotherapy;Ultrasound;Traction;Moist Heat;Electrical Stimulation;DME Instruction;Gait training;Therapeutic exercise;Therapeutic activities;Functional mobility training;Stair training;Balance training;Neuromuscular re-education;Patient/family education;Manual techniques;Orthotic Fit/Training;Compression bandaging;Passive range of motion;Vestibular;Taping;Splinting;Energy conservation;Dry needling;Visual/perceptual remediation/compensation    PT Next Visit Plan Continue with progessive balance training, Progressive LE strenthening as appropriate.    PT Home Exercise Plan Access Code: [email or web address] URL: [email or web address]  Added Bridges with BTB and encouraged more walking around the home during commerical breaks when watching TV.    Consulted and Agree with Plan of Care Patient              Ollen Bowl, PT Physical Therapist- [facility]  3:32 PM, 05/22/22

## 2022-05-23 NOTE — Therapy (Signed)
OUTPATIENT PHYSICAL THERAPY TREATMENT NOTE  Patient Name: Dustin Gray MRN: [id number] DOB:12-10-1955, 66 y.o., male Today's Date: 05/24/2022  PCP: [name], MD REFERRING PROVIDER: [name], MD   PT End of Session - 05/24/22 1447     Visit Number 95    Number of Visits 107    Date for PT Re-Evaluation 06/26/22    Authorization Type [employer] Auth 11/28/21-02/22/22 for 24 visits    Authorization Time Period Cert for 09/20/49-88/4/16; Recert 60/63/0160- 1/0/9323    Progress Note Due on Visit 100    PT Start Time 1433    PT Stop Time 1513    PT Time Calculation (min) 40 min    Equipment Utilized During Treatment Gait belt    Activity Tolerance Patient tolerated treatment well;No increased pain;Patient limited by fatigue    Behavior During Therapy [facility] for tasks assessed/performed                        Past Medical History:  Diagnosis Date   Abscess    groin   Arthritis    lower spine   Erectile dysfunction    Low testosterone    Lumbar herniated disc    L5   Multiple sclerosis ([place])    Staph aureus infection    Past Surgical History:  Procedure Laterality Date   CARDIOVERSION N/A 11/22/2021   Procedure: CARDIOVERSION;  Surgeon: [name], MD;  Location: [facility];  Service: Cardiovascular;  Laterality: N/A;   COLONOSCOPY WITH PROPOFOL N/A 07/04/2016   Procedure: COLONOSCOPY WITH PROPOFOL;  Surgeon: [name], MD;  Location: [place];  Service: Endoscopy;  Laterality: N/A;   [place]   POLYPECTOMY  07/04/2016   Procedure: POLYPECTOMY;  Surgeon: [name], MD;  Location: [place];  Service: Endoscopy;;   TONSILLECTOMY     Patient Active Problem List   Diagnosis Date Noted   Persistent atrial fibrillation ([place])    Graves disease    Abdominal bloating    Atrial fibrillation with RVR ([place]) 10/01/2019   Hyperthyroidism    Atrial fibrillation with rapid ventricular  response ([place]) 09/30/2019   Leg edema    Left leg cellulitis    Special screening for malignant neoplasms, colon    Polyp of sigmoid colon    Benign neoplasm of ascending colon    Rectal polyp    Herniated nucleus pulposus, L5-S1 11/09/2015   Low back pain 10/25/2015   Herniated nucleus pulposus, C5-6 right 10/06/2015   Dysesthesia 09/16/2015   Spasticity 09/16/2015   Unsteady gait 09/16/2015   Multiple sclerosis ([place]) 06/08/2011     REFERRING DIAG: R26.81 (ICD-10-CM) - Unsteadiness on feet   THERAPY DIAG:  Unsteadiness on feet  Difficulty in walking, not elsewhere classified  Abnormality of gait and mobility  Muscle weakness (generalized)  Rationale for Evaluation and Treatment Rehabilitation  PERTINENT HISTORY: Patient presents to physical therapy for unsteadiness, walking instability, fatigue. His PMH includes paroxysmal a fib, MS, lymphedema, hypothyroidism, Graves Disease, arthritis, lumbar herniated disc (L5), depression and neuropathy. Patient first developed symptoms of MS in 2005 and was diagnosed in 2006. Patient has done PT in the past, but hasn't been seen since 2020 at [facility]. Has not been doing his exercises in the past year. Walk outside to garage to ride lawnmower, walks in house. 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  PAIN:  Are you having pain? Yes. Low Back. 4/10- ache Worse with mobility, better with rest.     TODAY'S TREATMENT:   Therex:   Well zone exercises:    Knee ext-  Level 2 x  12 reps Left LE;  Level 3 Right LE x 12 reps Level 3 x 10 reps Left LE:  Level 4 Right LE x 10 reps Level 3 x 8 reps Left LE; level 5 Right LE x 8 reps Level 3 x 6 reps Left  LE; Level 6 Right LE  x 6 reps  Level 3 x 4 reps Left LE; level  7 right LE x 4 reps  Level 4 x 2 reps Left LE; Level  8 right LE x 2  reps   Knee flex:   Level 1 Left LE x 12; Level 1 x 12 with right LE Level 2 Left LE x 10  reps; Level  2x 10 reps with right LE Level  3 Left LE x 8 reps; Level 3 x 8 reps with right LE Level 4 left LE x 6 reps ; Level 5 x 6 reps with right LE Level 5 Left LE x 4 reps (near full ROM); Level 5 x 4 reps right LE Level 6 Left LE x 2 reps (partial ROM); Level 6 x 2 reps right LE    Leg press: Seat at level 11   Level 2 right LE x 12 reps; Left LE - level 2- 12 reps Level 3 RLE x10; LLE x10       Level 4 RLE x8; LLE x8           Level 5 right LE x 6 reps; Left LE- Level 5 - 6 reps  Level 6 RLE x4; LLE x4 Level 6 right LE x 2 reps; Right LE - Level 7- 2 reps     Calf press (Today pt requires some manual assistance keeping LLE on pad):  Level 2 each LE x 12 reps each Level 3 each LE x 10 reps each Level 4 each LE x 8 reps each Level 5 each LE x 6 reps each  Level 6 each LE x 4 reps  Level 7 right LE x 2 reps; Level 6 LeftLE x 2 reps       PATIENT EDUCATION: Education details: exercise technique, body mechanics Person educated: Patient Education method: Customer service manager, verbal cues Education comprehension: verbalized understanding, returned demonstration    HOME EXERCISE PROGRAM: No changes this session   PT Short Term Goals -       PT SHORT TERM GOAL #1   Title Patient will be independent in home exercise program to improve strength/mobility for better functional independence with ADLs.    Baseline 8/9: HEP to be given next session, 10/31: patient reports compliance with HEP and would like progressions added next session. 08/22/2021=Patient verbalized that he is walking and doing his LE exercises with no questions at this time.    Time 4    Period Weeks    Status Achieved    Target Date 08/16/21              PT Long Term Goals       PT LONG TERM GOAL #1   Title Patient will increase FOTO score to equal to or greater than 70%  to demonstrate  statistically significant improvement in mobility and  quality of life.    Baseline 8/9: 64%; risk adjusted 34%; 03/21/21 FOTO: 45; 04/24/21 FOTO: 49% ; 08/22/2021= 56% 4/20: 60% 11/28/21: 63%; 01/04/2022= 60%. 03/22/2022= 73%   Time 12    Period Weeks    Status Achieved   Target Date 06/26/2022     PT LONG TERM GOAL #2   Title Patient will increase Berg Balance score by > 6 points to demonstrate decreased fall risk during functional activities.    Baseline 4/20: 43/56 6/6: 48/56; 713/2023= 50/56   Time 12    Period Weeks    Status Achieved   Target Date 03/29/2022     PT LONG TERM GOAL #3   Title Patient will increase 10 meter walk test to >1.73ms as to improve gait speed for better community ambulation and to reduce fall risk.    Baseline 8/9: 0.65 m/s with rollator; 10/31: 0.68 m/s with rollator. 07/06/2021= 0.71 m/s using rollator; 08/22/2021= 0.94 m/s using rollator. 4/20: 1.1 m/s with rollator    Time 12    Period Weeks    Status Achieved    Target Date 10/11/21      PT LONG TERM GOAL #4   Title Patient will increase six minute walk test distance to >1000 for progression to community ambulator and improve gait ability    Baseline 8/9: 505 ft with rollator; 03/21/21: 7771fc rollator, 04/24/21: 75541f rollator; 07/06/2021=900 feet; 08/22/2021= 935 feet with use of Rollator 44/20: 940 ft 11/28/21: 1055 feet with rollator   Time 12    Period Weeks    Status Achieved    Target Date 01/04/22      PT LONG TERM GOAL #5   Title Patient will increase glute medius strength on Left LE from able to lift off 3" (sidelye on mat) to > 5" to improve stability, gait mechanics, and functional strength.    Baseline 10/31: 3-/5; 07/06/2021= 3-/5; 08/22/2021=3-/5 unable to achieve full ROM yet able to withstand some resistance with testing 4/20: unable to obtainfull ROM against gravity 6/6: unable to move against gravity; 01/04/2022= Continues to have difficulty - unable to raise left LE in sidelye against gravity  but able to perform supine left Hip abd with some resistance= 3-/5; 03/22/2022- Patient able to raise his left LE 3 in off mat today. 04/03/2022= Patient able to raise left LE 3" off mat in sidelye today; 05/01/2022= 05/01/2022= patient able to raise left LE from sidelye position= 5"   Time 12    Period Weeks    Status  PROGRESSING    Target Date 06/26/2022     PT LONG TERM GOAL #6   Title Patient will increase Left single leg stance time to 15 seconds or greater to increase safety in shower and independence with ADLs.    Baseline 10/31; 1.83 sec without UE support; 08/22/2021=2-3  sec  on left; 6 sec on right 4/20: RLE 10 seconds LLE unable to perform 6/6: R LE 4 seconds during BERG; 01/04/2022= left = 3 sec and right = 8 sec; 02/13/2022= 20 sec on right at best and 4 sec on left LE at best. 03/23/2022=Patient able to stand on left LE at best for 7 sec today. 04/03/2022= 27 sec on right and 12 sec on left;    Time 12    Period Weeks    Status On-going    Target Date 06/26/2022     PT LONG TERM GOAL #7   Title Patient will safely negotiate 13 steps with handrails  to safely enter/exit mothers house.    Baseline 4/20: very challenging 6/6: able to copmlete safely even managing dogs per pt report; 01/04/2022- Patient performed 16 steps with B Rail-no difficulty other than fatigue.    Time 12    Period Weeks    Status Achieved   Target Date 01/04/22    PT LONG TERM GOAL #8  Title Patient will increase six minute walk test distance to >1100 ft.  for progression to community ambulator and improve gait ability   Baseline 04/03/2022= 920 feet with 4WW - (Added another goal for endurance to continue to progress community amb distances); 05/03/2022= 960 feet with upright 4WW (patient's first time using device)   Time 12   Period Weeks   Status Progressing  Target Date 06/26/2022            Plan - 12/13/21 1340     Clinical Impression Statement Patient returned to more gym based exercises -pyramid  style demonstrating about the same as he did 2 weeks ago- left LE fatiguing but no worse since last gym session in well zone. Discussed need to continue with HEP to continue with strength gains and patient verbalized understanding.  Pt will continue to benefit from skilled PT interventions to improve his overall strength, mobility and quality of life.    Personal Factors and Comorbidities Age;Comorbidity 3+;Fitness;Past/Current Experience;Time since onset of injury/illness/exacerbation    Comorbidities aroxysmal a fib, MS, lymphedema, hypothyroidism, Graves Disease, arthritis, lumbar herniated disc (L5), depression and neuropathy    Examination-Activity Limitations Bed Mobility;Bend;Caring for Others;Carry;Reach Overhead;Locomotion Level;Lift;Hygiene/Grooming;Dressing;Squat;Stairs;Stand;Toileting;Transfers    Examination-Participation Restrictions Cleaning;Community Activity;Laundry;Occupation;Shop;Volunteer;Yard Work    Merchant [employer] officer Evolving/Moderate complexity    Rehab Potential Fair    PT Frequency 2x / week    PT Duration 12 weeks    PT Treatment/Interventions ADLs/Self Care Home Management;Aquatic Therapy;Canalith Repostioning;Biofeedback;Cryotherapy;Ultrasound;Traction;Moist Heat;Electrical Stimulation;DME Instruction;Gait training;Therapeutic exercise;Therapeutic activities;Functional mobility training;Stair training;Balance training;Neuromuscular re-education;Patient/family education;Manual techniques;Orthotic Fit/Training;Compression bandaging;Passive range of motion;Vestibular;Taping;Splinting;Energy conservation;Dry needling;Visual/perceptual remediation/compensation    PT Next Visit Plan Continue with progessive balance training, Progressive LE strenthening as appropriate.    PT Home Exercise Plan Access Code: [email or web address] URL: [email or web address]  Added Bridges with BTB and encouraged more walking around the home during commerical breaks when watching TV.     Consulted and Agree with Plan of Care Patient              Ollen Bowl, PT Physical Therapist- [facility]  5:00 PM, 05/24/22

## 2022-05-24 ENCOUNTER — Ambulatory Visit: Payer: Medicare PPO

## 2022-05-24 DIAGNOSIS — R2689 Other abnormalities of gait and mobility: Secondary | ICD-10-CM | POA: Diagnosis not present

## 2022-05-24 DIAGNOSIS — R2681 Unsteadiness on feet: Secondary | ICD-10-CM

## 2022-05-24 DIAGNOSIS — R262 Difficulty in walking, not elsewhere classified: Secondary | ICD-10-CM | POA: Diagnosis not present

## 2022-05-24 DIAGNOSIS — R269 Unspecified abnormalities of gait and mobility: Secondary | ICD-10-CM | POA: Diagnosis not present

## 2022-05-24 DIAGNOSIS — M6281 Muscle weakness (generalized): Secondary | ICD-10-CM | POA: Diagnosis not present

## 2022-05-24 DIAGNOSIS — R278 Other lack of coordination: Secondary | ICD-10-CM

## 2022-05-28 ENCOUNTER — Ambulatory Visit
Admission: RE | Admit: 2022-05-28 | Discharge: 2022-05-28 | Disposition: A | Discharge status: Home / Self Care | Payer: Medicare PPO | Source: Ambulatory Visit | Attending: Neurology | Admitting: Neurology

## 2022-05-28 DIAGNOSIS — G35 Multiple sclerosis: Secondary | ICD-10-CM | POA: Diagnosis not present

## 2022-05-28 DIAGNOSIS — M5136 Other intervertebral disc degeneration, lumbar region: Secondary | ICD-10-CM | POA: Insufficient documentation

## 2022-05-28 DIAGNOSIS — G319 Degenerative disease of nervous system, unspecified: Secondary | ICD-10-CM | POA: Diagnosis not present

## 2022-05-28 DIAGNOSIS — M51369 Other intervertebral disc degeneration, lumbar region without mention of lumbar back pain or lower extremity pain: Secondary | ICD-10-CM

## 2022-05-28 DIAGNOSIS — M5126 Other intervertebral disc displacement, lumbar region: Secondary | ICD-10-CM | POA: Diagnosis not present

## 2022-05-28 DIAGNOSIS — R200 Anesthesia of skin: Secondary | ICD-10-CM | POA: Diagnosis not present

## 2022-05-29 ENCOUNTER — Ambulatory Visit: Payer: Medicare PPO | Attending: Physician Assistant

## 2022-05-29 DIAGNOSIS — I890 Lymphedema, not elsewhere classified: Secondary | ICD-10-CM | POA: Insufficient documentation

## 2022-05-29 DIAGNOSIS — R2689 Other abnormalities of gait and mobility: Secondary | ICD-10-CM | POA: Insufficient documentation

## 2022-05-29 DIAGNOSIS — M6281 Muscle weakness (generalized): Secondary | ICD-10-CM | POA: Insufficient documentation

## 2022-05-29 DIAGNOSIS — R278 Other lack of coordination: Secondary | ICD-10-CM | POA: Insufficient documentation

## 2022-05-29 DIAGNOSIS — R269 Unspecified abnormalities of gait and mobility: Secondary | ICD-10-CM | POA: Insufficient documentation

## 2022-05-29 DIAGNOSIS — R2681 Unsteadiness on feet: Secondary | ICD-10-CM | POA: Insufficient documentation

## 2022-05-29 DIAGNOSIS — R262 Difficulty in walking, not elsewhere classified: Secondary | ICD-10-CM | POA: Insufficient documentation

## 2022-05-29 NOTE — Therapy (Incomplete)
OUTPATIENT PHYSICAL THERAPY TREATMENT NOTE  Patient Name: Dustin Gray MRN: [id number] DOB:August 04, 1955, 66 y.o., male Today's Date: 05/29/2022  PCP: [name], MD REFERRING PROVIDER: [name], MD                Past Medical History:  Diagnosis Date   Abscess    groin   Arthritis    lower spine   Erectile dysfunction    Low testosterone    Lumbar herniated disc    L5   Multiple sclerosis ([place])    Staph aureus infection    Past Surgical History:  Procedure Laterality Date   CARDIOVERSION N/A 11/22/2021   Procedure: CARDIOVERSION;  Surgeon: [name], MD;  Location: [facility];  Service: Cardiovascular;  Laterality: N/A;   COLONOSCOPY WITH PROPOFOL N/A 07/04/2016   Procedure: COLONOSCOPY WITH PROPOFOL;  Surgeon: [name], MD;  Location: [place];  Service: Endoscopy;  Laterality: N/A;   [place]   POLYPECTOMY  07/04/2016   Procedure: POLYPECTOMY;  Surgeon: [name], MD;  Location: [place];  Service: Endoscopy;;   TONSILLECTOMY     Patient Active Problem List   Diagnosis Date Noted   Persistent atrial fibrillation ([place])    Graves disease    Abdominal bloating    Atrial fibrillation with RVR ([place]) 10/01/2019   Hyperthyroidism    Atrial fibrillation with rapid ventricular response ([place]) 09/30/2019   Leg edema    Left leg cellulitis    Special screening for malignant neoplasms, colon    Polyp of sigmoid colon    Benign neoplasm of ascending colon    Rectal polyp    Herniated nucleus pulposus, L5-S1 11/09/2015   Low back pain 10/25/2015   Herniated nucleus pulposus, C5-6 right 10/06/2015   Dysesthesia 09/16/2015   Spasticity 09/16/2015   Unsteady gait 09/16/2015   Multiple sclerosis ([place]) 06/08/2011     REFERRING DIAG: R26.81 (ICD-10-CM) - Unsteadiness on feet   THERAPY DIAG:  Unsteadiness on feet  Difficulty in walking, not elsewhere  classified  Abnormality of gait and mobility  Muscle weakness (generalized)  Rationale for Evaluation and Treatment Rehabilitation  PERTINENT HISTORY: Patient presents to physical therapy for unsteadiness, walking instability, fatigue. His PMH includes paroxysmal a fib, MS, lymphedema, hypothyroidism, Graves Disease, arthritis, lumbar herniated disc (L5), depression and neuropathy. Patient first developed symptoms of MS in 2005 and was diagnosed in 2006. Patient has done PT in the past, but hasn't been seen since 2020 at [facility]. Has not been doing his exercises in the past year. Walk outside to garage to ride lawnmower, walks in house. Retired Automotive engineer. Has a rollator at home but doesn't use it in the house   PRECAUTIONS: Fall  SUBJECTIVE: Patient reports back pain had improved but now has kicked back up again.  PAIN:  Are you having pain? Yes. Low Back. 4/10- ache Worse with mobility, better with rest.     TODAY'S TREATMENT:   Therex:   Well zone exercises:    Knee ext-  Level 2 x  12 reps Left LE;  Level 3 Right LE x 12 reps Level 3 x 10 reps Left LE:  Level 4 Right LE x 10 reps Level 3 x 8 reps Left LE; level 5 Right LE x 8 reps Level 3 x 6 reps Left  LE; Level 6 Right LE  x 6 reps  Level 3 x 4 reps Left LE; level  7 right  LE x 4 reps  Level 4 x 2 reps Left LE; Level  8 right LE x 2 reps   Knee flex:   Level 1 Left LE x 12; Level 1 x 12 with right LE Level 2 Left LE x 10  reps; Level  2x 10 reps with right LE Level  3 Left LE x 8 reps; Level 3 x 8 reps with right LE Level 4 left LE x 6 reps ; Level 5 x 6 reps with right LE Level 5 Left LE x 4 reps (near full ROM); Level 5 x 4 reps right LE Level 6 Left LE x 2 reps (partial ROM); Level 6 x 2 reps right LE    Leg press: Seat at level 11   Level 2 right LE x 12 reps; Left LE - level 2- 12 reps Level 3 RLE x10; LLE x10       Level 4 RLE x8; LLE x8           Level 5 right LE x 6 reps; Left LE- Level 5 - 6  reps  Level 6 RLE x4; LLE x4 Level 6 right LE x 2 reps; Right LE - Level 7- 2 reps     Calf press (Today pt requires some manual assistance keeping LLE on pad):  Level 2 each LE x 12 reps each Level 3 each LE x 10 reps each Level 4 each LE x 8 reps each Level 5 each LE x 6 reps each  Level 6 each LE x 4 reps  Level 7 right LE x 2 reps; Level 6 LeftLE x 2 reps       PATIENT EDUCATION: Education details: exercise technique, body mechanics Person educated: Patient Education method: Customer service manager, verbal cues Education comprehension: verbalized understanding, returned demonstration    HOME EXERCISE PROGRAM: No changes this session   PT Short Term Goals -       PT SHORT TERM GOAL #1   Title Patient will be independent in home exercise program to improve strength/mobility for better functional independence with ADLs.    Baseline 8/9: HEP to be given next session, 10/31: patient reports compliance with HEP and would like progressions added next session. 08/22/2021=Patient verbalized that he is walking and doing his LE exercises with no questions at this time.    Time 4    Period Weeks    Status Achieved    Target Date 08/16/21              PT Long Term Goals       PT LONG TERM GOAL #1   Title Patient will increase FOTO score to equal to or greater than 70%  to demonstrate statistically significant improvement in mobility and quality of life.    Baseline 8/9: 64%; risk adjusted 34%; 03/21/21 FOTO: 45; 04/24/21 FOTO: 49% ; 08/22/2021= 56% 4/20: 60% 11/28/21: 63%; 01/04/2022= 60%. 03/22/2022= 73%   Time 12    Period Weeks    Status Achieved   Target Date 06/26/2022     PT LONG TERM GOAL #2   Title Patient will increase Berg Balance score by > 6 points to demonstrate decreased fall risk during functional activities.    Baseline 4/20: 43/56 6/6: 48/56; 713/2023= 50/56   Time 12    Period Weeks    Status Achieved   Target Date 03/29/2022     PT LONG TERM GOAL  #3   Title Patient will increase 10 meter walk test to >  1.75ms as to improve gait speed for better community ambulation and to reduce fall risk.    Baseline 8/9: 0.65 m/s with rollator; 10/31: 0.68 m/s with rollator. 07/06/2021= 0.71 m/s using rollator; 08/22/2021= 0.94 m/s using rollator. 4/20: 1.1 m/s with rollator    Time 12    Period Weeks    Status Achieved    Target Date 10/11/21      PT LONG TERM GOAL #4   Title Patient will increase six minute walk test distance to >1000 for progression to community ambulator and improve gait ability    Baseline 8/9: 505 ft with rollator; 03/21/21: [id number] rollator, 04/24/21: 75510f rollator; 07/06/2021=900 feet; 08/22/2021= 935 feet with use of Rollator 44/20: 940 ft 11/28/21: 1055 feet with rollator   Time 12    Period Weeks    Status Achieved    Target Date 01/04/22      PT LONG TERM GOAL #5   Title Patient will increase glute medius strength on Left LE from able to lift off 3" (sidelye on mat) to > 5" to improve stability, gait mechanics, and functional strength.    Baseline 10/31: 3-/5; 07/06/2021= 3-/5; 08/22/2021=3-/5 unable to achieve full ROM yet able to withstand some resistance with testing 4/20: unable to obtainfull ROM against gravity 6/6: unable to move against gravity; 01/04/2022= Continues to have difficulty - unable to raise left LE in sidelye against gravity but able to perform supine left Hip abd with some resistance= 3-/5; 03/22/2022- Patient able to raise his left LE 3 in off mat today. 04/03/2022= Patient able to raise left LE 3" off mat in sidelye today; 05/01/2022= 05/01/2022= patient able to raise left LE from sidelye position= 5"   Time 12    Period Weeks    Status  PROGRESSING    Target Date 06/26/2022     PT LONG TERM GOAL #6   Title Patient will increase Left single leg stance time to 15 seconds or greater to increase safety in shower and independence with ADLs.    Baseline 10/31; 1.83 sec without UE support; 08/22/2021=2-3  sec  on  left; 6 sec on right 4/20: RLE 10 seconds LLE unable to perform 6/6: R LE 4 seconds during BERG; 01/04/2022= left = 3 sec and right = 8 sec; 02/13/2022= 20 sec on right at best and 4 sec on left LE at best. 03/23/2022=Patient able to stand on left LE at best for 7 sec today. 04/03/2022= 27 sec on right and 12 sec on left;    Time 12    Period Weeks    Status On-going    Target Date 06/26/2022     PT LONG TERM GOAL #7   Title Patient will safely negotiate 13 steps with handrails to safely enter/exit mothers house.    Baseline 4/20: very challenging 6/6: able to copmlete safely even managing dogs per pt report; 01/04/2022- Patient performed 16 steps with B Rail-no difficulty other than fatigue.    Time 12    Period Weeks    Status Achieved   Target Date 01/04/22    PT LONG TERM GOAL #8  Title Patient will increase six minute walk test distance to >1100 ft.  for progression to community ambulator and improve gait ability   Baseline 04/03/2022= 920 feet with 4WW - (Added another goal for endurance to continue to progress community amb distances); 05/03/2022= 960 feet with upright 4WW (patient's first time using device)   Time 12   Period Weeks  Status Progressing  Target Date 06/26/2022            Plan - 12/13/21 1340     Clinical Impression Statement Patient returned to more gym based exercises -pyramid style demonstrating about the same as he did 2 weeks ago- left LE fatiguing but no worse since last gym session in well zone. Discussed need to continue with HEP to continue with strength gains and patient verbalized understanding.  Pt will continue to benefit from skilled PT interventions to improve his overall strength, mobility and quality of life.    Personal Factors and Comorbidities Age;Comorbidity 3+;Fitness;Past/Current Experience;Time since onset of injury/illness/exacerbation    Comorbidities aroxysmal a fib, MS, lymphedema, hypothyroidism, Graves Disease, arthritis, lumbar  herniated disc (L5), depression and neuropathy    Examination-Activity Limitations Bed Mobility;Bend;Caring for Others;Carry;Reach Overhead;Locomotion Level;Lift;Hygiene/Grooming;Dressing;Squat;Stairs;Stand;Toileting;Transfers    Examination-Participation Restrictions Cleaning;Community Activity;Laundry;Occupation;Shop;Volunteer;Yard Work    Merchant [employer] officer Evolving/Moderate complexity    Rehab Potential Fair    PT Frequency 2x / week    PT Duration 12 weeks    PT Treatment/Interventions ADLs/Self Care Home Management;Aquatic Therapy;Canalith Repostioning;Biofeedback;Cryotherapy;Ultrasound;Traction;Moist Heat;Electrical Stimulation;DME Instruction;Gait training;Therapeutic exercise;Therapeutic activities;Functional mobility training;Stair training;Balance training;Neuromuscular re-education;Patient/family education;Manual techniques;Orthotic Fit/Training;Compression bandaging;Passive range of motion;Vestibular;Taping;Splinting;Energy conservation;Dry needling;Visual/perceptual remediation/compensation    PT Next Visit Plan Continue with progessive balance training, Progressive LE strenthening as appropriate.    PT Home Exercise Plan Access Code: [email or web address] URL: [email or web address]  Added Bridges with BTB and encouraged more walking around the home during commerical breaks when watching TV.    Consulted and Agree with Plan of Care Patient              Ollen Bowl, PT Physical Therapist- [facility]  2:24 PM, 05/29/22

## 2022-05-31 ENCOUNTER — Ambulatory Visit: Payer: Medicare PPO

## 2022-05-31 DIAGNOSIS — R262 Difficulty in walking, not elsewhere classified: Secondary | ICD-10-CM | POA: Diagnosis not present

## 2022-05-31 DIAGNOSIS — M6281 Muscle weakness (generalized): Secondary | ICD-10-CM | POA: Diagnosis not present

## 2022-05-31 DIAGNOSIS — R2681 Unsteadiness on feet: Secondary | ICD-10-CM | POA: Diagnosis not present

## 2022-05-31 DIAGNOSIS — I890 Lymphedema, not elsewhere classified: Secondary | ICD-10-CM | POA: Diagnosis not present

## 2022-05-31 DIAGNOSIS — R2689 Other abnormalities of gait and mobility: Secondary | ICD-10-CM | POA: Diagnosis not present

## 2022-05-31 DIAGNOSIS — R278 Other lack of coordination: Secondary | ICD-10-CM

## 2022-05-31 DIAGNOSIS — R269 Unspecified abnormalities of gait and mobility: Secondary | ICD-10-CM | POA: Diagnosis not present

## 2022-05-31 NOTE — Therapy (Addendum)
OUTPATIENT PHYSICAL THERAPY TREATMENT NOTE  Patient Name: Dustin Gray MRN: [id number] DOB:18-Oct-1955, 66 y.o., male Today's Date: 06/01/2022  PCP: [name], MD REFERRING PROVIDER: [name], MD   PT End of Session - 05/31/22 6606     Visit Number 96    Number of Visits 107    Date for PT Re-Evaluation 06/26/22    Authorization Type [employer] Auth 11/28/21-02/22/22 for 24 visits    Authorization Time Period Cert for 08/24/58-04/03/31; Recert 35/57/3220- 07/30/4268    Progress Note Due on Visit 100    PT Start Time 1435    PT Stop Time 1514    PT Time Calculation (min) 39 min    Equipment Utilized During Treatment Gait belt    Activity Tolerance Patient tolerated treatment well;No increased pain;Patient limited by fatigue    Behavior During Therapy [facility] for tasks assessed/performed                         Past Medical History:  Diagnosis Date   Abscess    groin   Arthritis    lower spine   Erectile dysfunction    Low testosterone    Lumbar herniated disc    L5   Multiple sclerosis ([place])    Staph aureus infection    Past Surgical History:  Procedure Laterality Date   CARDIOVERSION N/A 11/22/2021   Procedure: CARDIOVERSION;  Surgeon: [name], MD;  Location: [facility];  Service: Cardiovascular;  Laterality: N/A;   COLONOSCOPY WITH PROPOFOL N/A 07/04/2016   Procedure: COLONOSCOPY WITH PROPOFOL;  Surgeon: [name], MD;  Location: [place];  Service: Endoscopy;  Laterality: N/A;   [place]   POLYPECTOMY  07/04/2016   Procedure: POLYPECTOMY;  Surgeon: [name], MD;  Location: [place];  Service: Endoscopy;;   TONSILLECTOMY     Patient Active Problem List   Diagnosis Date Noted   Persistent atrial fibrillation ([place])    Graves disease    Abdominal bloating    Atrial fibrillation with RVR ([place]) 10/01/2019   Hyperthyroidism    Atrial fibrillation with rapid ventricular  response ([place]) 09/30/2019   Leg edema    Left leg cellulitis    Special screening for malignant neoplasms, colon    Polyp of sigmoid colon    Benign neoplasm of ascending colon    Rectal polyp    Herniated nucleus pulposus, L5-S1 11/09/2015   Low back pain 10/25/2015   Herniated nucleus pulposus, C5-6 right 10/06/2015   Dysesthesia 09/16/2015   Spasticity 09/16/2015   Unsteady gait 09/16/2015   Multiple sclerosis ([place]) 06/08/2011     REFERRING DIAG: R26.81 (ICD-10-CM) - Unsteadiness on feet   THERAPY DIAG:  Unsteadiness on feet  Difficulty in walking, not elsewhere classified  Abnormality of gait and mobility  Muscle weakness (generalized)  Rationale for Evaluation and Treatment Rehabilitation  PERTINENT HISTORY: Patient presents to physical therapy for unsteadiness, walking instability, fatigue. His PMH includes paroxysmal a fib, MS, lymphedema, hypothyroidism, Graves Disease, arthritis, lumbar herniated disc (L5), depression and neuropathy. Patient first developed symptoms of MS in 2005 and was diagnosed in 2006. Patient has done PT in the past, but hasn't been seen since 2020 at [facility]. Has not been doing his exercises in the past year. Walk outside to garage to ride lawnmower, walks in house. 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  PAIN:  Are you having pain? No Worse with mobility, better with rest.     TODAY'S TREATMENT:    Therex:   Resistive cable hip strengthening:   Ext 7.5 # 2 sets of 12 reps BLE  Abd 7.5 # 2 sets of 12 reps BLE  Flex 7.5 # 2 sets of 12 reps BLE  Standing in varying foot positions on airex pad arranging letters into alphabetical order   Feet together  Feet staggered  PATIENT EDUCATION: Education details: exercise technique, body mechanics Person educated: Patient Education method:  Customer service manager, verbal cues Education comprehension: verbalized understanding, returned demonstration    HOME EXERCISE PROGRAM: No changes this session   PT Short Term Goals -       PT SHORT TERM GOAL #1   Title Patient will be independent in home exercise program to improve strength/mobility for better functional independence with ADLs.    Baseline 8/9: HEP to be given next session, 10/31: patient reports compliance with HEP and would like progressions added next session. 08/22/2021=Patient verbalized that he is walking and doing his LE exercises with no questions at this time.    Time 4    Period Weeks    Status Achieved    Target Date 08/16/21              PT Long Term Goals       PT LONG TERM GOAL #1   Title Patient will increase FOTO score to equal to or greater than 70%  to demonstrate statistically significant improvement in mobility and quality of life.    Baseline 8/9: 64%; risk adjusted 34%; 03/21/21 FOTO: 45; 04/24/21 FOTO: 49% ; 08/22/2021= 56% 4/20: 60% 11/28/21: 63%; 01/04/2022= 60%. 03/22/2022= 73%   Time 12    Period Weeks    Status Achieved   Target Date 06/26/2022     PT LONG TERM GOAL #2   Title Patient will increase Berg Balance score by > 6 points to demonstrate decreased fall risk during functional activities.    Baseline 4/20: 43/56 6/6: 48/56; 713/2023= 50/56   Time 12    Period Weeks    Status Achieved   Target Date 03/29/2022     PT LONG TERM GOAL #3   Title Patient will increase 10 meter walk test to >1.58ms as to improve gait speed for better community ambulation and to reduce fall risk.    Baseline 8/9: 0.65 m/s with rollator; 10/31: 0.68 m/s with rollator. 07/06/2021= 0.71 m/s using rollator; 08/22/2021= 0.94 m/s using rollator. 4/20: 1.1 m/s with rollator    Time 12    Period Weeks    Status Achieved    Target Date 10/11/21      PT LONG TERM GOAL #4   Title Patient will increase six minute walk test distance to >1000 for  progression to community ambulator and improve gait ability    Baseline 8/9: 505 ft with rollator; 03/21/21: 7716fc rollator, 04/24/21: 75562f rollator; 07/06/2021=900 feet; 08/22/2021= 935 feet with use of Rollator 44/20: 940 ft 11/28/21: 1055 feet with rollator   Time 12    Period Weeks    Status Achieved    Target Date 01/04/22      PT LONG TERM GOAL #5   Title Patient will increase glute medius strength on Left LE from able to lift off 3" (sidelye on mat) to > 5" to improve  stability, gait mechanics, and functional strength.    Baseline 10/31: 3-/5; 07/06/2021= 3-/5; 08/22/2021=3-/5 unable to achieve full ROM yet able to withstand some resistance with testing 4/20: unable to obtainfull ROM against gravity 6/6: unable to move against gravity; 01/04/2022= Continues to have difficulty - unable to raise left LE in sidelye against gravity but able to perform supine left Hip abd with some resistance= 3-/5; 03/22/2022- Patient able to raise his left LE 3 in off mat today. 04/03/2022= Patient able to raise left LE 3" off mat in sidelye today; 05/01/2022= 05/01/2022= patient able to raise left LE from sidelye position= 5"   Time 12    Period Weeks    Status  PROGRESSING    Target Date 06/26/2022     PT LONG TERM GOAL #6   Title Patient will increase Left single leg stance time to 15 seconds or greater to increase safety in shower and independence with ADLs.    Baseline 10/31; 1.83 sec without UE support; 08/22/2021=2-3  sec  on left; 6 sec on right 4/20: RLE 10 seconds LLE unable to perform 6/6: R LE 4 seconds during BERG; 01/04/2022= left = 3 sec and right = 8 sec; 02/13/2022= 20 sec on right at best and 4 sec on left LE at best. 03/23/2022=Patient able to stand on left LE at best for 7 sec today. 04/03/2022= 27 sec on right and 12 sec on left;    Time 12    Period Weeks    Status On-going    Target Date 06/26/2022     PT LONG TERM GOAL #7   Title Patient will safely negotiate 13 steps with handrails to safely  enter/exit mothers house.    Baseline 4/20: very challenging 6/6: able to copmlete safely even managing dogs per pt report; 01/04/2022- Patient performed 16 steps with B Rail-no difficulty other than fatigue.    Time 12    Period Weeks    Status Achieved   Target Date 01/04/22    PT LONG TERM GOAL #8  Title Patient will increase six minute walk test distance to >1100 ft.  for progression to community ambulator and improve gait ability   Baseline 04/03/2022= 920 feet with 4WW - (Added another goal for endurance to continue to progress community amb distances); 05/03/2022= 960 feet with upright 4WW (patient's first time using device)   Time 12   Period Weeks   Status Progressing  Target Date 06/26/2022            Plan - 12/13/21 1340     Clinical Impression Statement Patient performed well - able to complete cable hip strengthening exercises with fatigue as limiting factor. He responded well to use of walk for UE support to perform all standing therex. He presented with gluteal weakness yet able to perform through partial ROM without significant compensation. Pt will continue to benefit from skilled PT interventions to improve his overall strength, mobility and quality of life.    Personal Factors and Comorbidities Age;Comorbidity 3+;Fitness;Past/Current Experience;Time since onset of injury/illness/exacerbation    Comorbidities aroxysmal a fib, MS, lymphedema, hypothyroidism, Graves Disease, arthritis, lumbar herniated disc (L5), depression and neuropathy    Examination-Activity Limitations Bed Mobility;Bend;Caring for Others;Carry;Reach Overhead;Locomotion Level;Lift;Hygiene/Grooming;Dressing;Squat;Stairs;Stand;Toileting;Transfers    Examination-Participation Restrictions Cleaning;Community Activity;Laundry;Occupation;Shop;Volunteer;Yard Work    Merchant navy officer Evolving/Moderate complexity    Rehab Potential Fair    PT Frequency 2x / week    PT Duration 12 weeks    PT  Treatment/Interventions ADLs/Self Care Home Management;Aquatic Therapy;Canalith  Repostioning;Biofeedback;Cryotherapy;Ultrasound;Traction;Moist Heat;Electrical Stimulation;DME Instruction;Gait training;Therapeutic exercise;Therapeutic activities;Functional mobility training;Stair training;Balance training;Neuromuscular re-education;Patient/family education;Manual techniques;Orthotic Fit/Training;Compression bandaging;Passive range of motion;Vestibular;Taping;Splinting;Energy conservation;Dry needling;Visual/perceptual remediation/compensation    PT Next Visit Plan Continue with progessive balance training, Progressive LE strenthening as appropriate.    PT Home Exercise Plan Access Code: [email or web address] URL: [email or web address]  Added Bridges with BTB and encouraged more walking around the home during commerical breaks when watching TV.    Consulted and Agree with Plan of Care Patient              Ollen Bowl, PT Physical Therapist- [facility]  12:12 PM, 06/01/22

## 2022-06-05 ENCOUNTER — Ambulatory Visit: Payer: Medicare PPO

## 2022-06-05 DIAGNOSIS — M6281 Muscle weakness (generalized): Secondary | ICD-10-CM | POA: Diagnosis not present

## 2022-06-05 DIAGNOSIS — R2681 Unsteadiness on feet: Secondary | ICD-10-CM | POA: Diagnosis not present

## 2022-06-05 DIAGNOSIS — R269 Unspecified abnormalities of gait and mobility: Secondary | ICD-10-CM | POA: Diagnosis not present

## 2022-06-05 DIAGNOSIS — R262 Difficulty in walking, not elsewhere classified: Secondary | ICD-10-CM

## 2022-06-05 DIAGNOSIS — I890 Lymphedema, not elsewhere classified: Secondary | ICD-10-CM | POA: Diagnosis not present

## 2022-06-05 DIAGNOSIS — E0500 Thyrotoxicosis with diffuse goiter without thyrotoxic crisis or storm: Secondary | ICD-10-CM | POA: Diagnosis not present

## 2022-06-05 DIAGNOSIS — R278 Other lack of coordination: Secondary | ICD-10-CM | POA: Diagnosis not present

## 2022-06-05 DIAGNOSIS — R2689 Other abnormalities of gait and mobility: Secondary | ICD-10-CM

## 2022-06-05 NOTE — Therapy (Signed)
OUTPATIENT PHYSICAL THERAPY TREATMENT NOTE  Patient Name: Dustin Gray MRN: [id number] DOB:12/10/1955, 66 y.o., male Today's Date: 06/06/2022  PCP: [name], MD REFERRING PROVIDER: [name], MD   PT End of Session - 06/05/22 1441     Visit Number 97    Number of Visits 107    Date for PT Re-Evaluation 06/26/22    Authorization Type [employer] Auth 11/28/21-02/22/22 for 24 visits    Authorization Time Period Cert for 4/97/02-63/7/85; Recert 88/50/2774- 06/26/8784    Progress Note Due on Visit 100    PT Start Time 1434    PT Stop Time 1514    PT Time Calculation (min) 40 min    Equipment Utilized During Treatment Gait belt    Activity Tolerance Patient tolerated treatment well;No increased pain;Patient limited by fatigue    Behavior During Therapy [facility] for tasks assessed/performed                         Past Medical History:  Diagnosis Date   Abscess    groin   Arthritis    lower spine   Erectile dysfunction    Low testosterone    Lumbar herniated disc    L5   Multiple sclerosis ([place])    Staph aureus infection    Past Surgical History:  Procedure Laterality Date   CARDIOVERSION N/A 11/22/2021   Procedure: CARDIOVERSION;  Surgeon: [name], MD;  Location: [facility];  Service: Cardiovascular;  Laterality: N/A;   COLONOSCOPY WITH PROPOFOL N/A 07/04/2016   Procedure: COLONOSCOPY WITH PROPOFOL;  Surgeon: [name], MD;  Location: [place];  Service: Endoscopy;  Laterality: N/A;   [place]   POLYPECTOMY  07/04/2016   Procedure: POLYPECTOMY;  Surgeon: [name], MD;  Location: [place];  Service: Endoscopy;;   TONSILLECTOMY     Patient Active Problem List   Diagnosis Date Noted   Persistent atrial fibrillation ([place])    Graves disease    Abdominal bloating    Atrial fibrillation with RVR ([place]) 10/01/2019   Hyperthyroidism    Atrial fibrillation with rapid ventricular  response ([place]) 09/30/2019   Leg edema    Left leg cellulitis    Special screening for malignant neoplasms, colon    Polyp of sigmoid colon    Benign neoplasm of ascending colon    Rectal polyp    Herniated nucleus pulposus, L5-S1 11/09/2015   Low back pain 10/25/2015   Herniated nucleus pulposus, C5-6 right 10/06/2015   Dysesthesia 09/16/2015   Spasticity 09/16/2015   Unsteady gait 09/16/2015   Multiple sclerosis ([place]) 06/08/2011     REFERRING DIAG: R26.81 (ICD-10-CM) - Unsteadiness on feet   THERAPY DIAG:  Unsteadiness on feet  Difficulty in walking, not elsewhere classified  Abnormality of gait and mobility  Muscle weakness (generalized)  Rationale for Evaluation and Treatment Rehabilitation  PERTINENT HISTORY: Patient presents to physical therapy for unsteadiness, walking instability, fatigue. His PMH includes paroxysmal a fib, MS, lymphedema, hypothyroidism, Graves Disease, arthritis, lumbar herniated disc (L5), depression and neuropathy. Patient first developed symptoms of MS in 2005 and was diagnosed in 2006. Patient has done PT in the past, but hasn't been seen since 2020 at [facility]. Has not been doing his exercises in the past year. Walk outside to garage to ride lawnmower, walks in house. Retired Automotive engineer. Has a rollator at home but doesn't use it in the house  PRECAUTIONS: Fall  SUBJECTIVE: Patient reports feeling okay- No pain and no falls.  PAIN:  Are you having pain? No    TODAY'S TREATMENT:    Therex:   Resistive cable hip strengthening:   Ext 7.5 # 2 sets of 12 reps each LE  Abd 2.5#  x 12 reps each LE; 7.5# RLE x 12 reps; 4.4 lb LLE x 12 reps  Flex 7.5 # 2 sets of 12 reps each LE  Knee ext 7.5# 2 sets of 12 reps Right LE; 2.5 # 2 sets of 12 reps     PATIENT EDUCATION: Education details: exercise technique, body mechanics Person educated: Patient Education method: Customer service manager, verbal cues Education comprehension:  verbalized understanding, returned demonstration    HOME EXERCISE PROGRAM: No changes this session   PT Short Term Goals -       PT SHORT TERM GOAL #1   Title Patient will be independent in home exercise program to improve strength/mobility for better functional independence with ADLs.    Baseline 8/9: HEP to be given next session, 10/31: patient reports compliance with HEP and would like progressions added next session. 08/22/2021=Patient verbalized that he is walking and doing his LE exercises with no questions at this time.    Time 4    Period Weeks    Status Achieved    Target Date 08/16/21              PT Long Term Goals       PT LONG TERM GOAL #1   Title Patient will increase FOTO score to equal to or greater than 70%  to demonstrate statistically significant improvement in mobility and quality of life.    Baseline 8/9: 64%; risk adjusted 34%; 03/21/21 FOTO: 45; 04/24/21 FOTO: 49% ; 08/22/2021= 56% 4/20: 60% 11/28/21: 63%; 01/04/2022= 60%. 03/22/2022= 73%   Time 12    Period Weeks    Status Achieved   Target Date 06/26/2022     PT LONG TERM GOAL #2   Title Patient will increase Berg Balance score by > 6 points to demonstrate decreased fall risk during functional activities.    Baseline 4/20: 43/56 6/6: 48/56; 713/2023= 50/56   Time 12    Period Weeks    Status Achieved   Target Date 03/29/2022     PT LONG TERM GOAL #3   Title Patient will increase 10 meter walk test to >1.53ms as to improve gait speed for better community ambulation and to reduce fall risk.    Baseline 8/9: 0.65 m/s with rollator; 10/31: 0.68 m/s with rollator. 07/06/2021= 0.71 m/s using rollator; 08/22/2021= 0.94 m/s using rollator. 4/20: 1.1 m/s with rollator    Time 12    Period Weeks    Status Achieved    Target Date 10/11/21      PT LONG TERM GOAL #4   Title Patient will increase six minute walk test distance to >1000 for progression to community ambulator and improve gait ability    Baseline 8/9:  505 ft with rollator; 03/21/21: 7732fc rollator, 04/24/21: 75546f rollator; 07/06/2021=900 feet; 08/22/2021= 935 feet with use of Rollator 44/20: 940 ft 11/28/21: 1055 feet with rollator   Time 12    Period Weeks    Status Achieved    Target Date 01/04/22      PT LONG TERM GOAL #5   Title Patient will increase glute medius strength on Left LE from able to lift off 3" (sidelye on mat) to >  5" to improve stability, gait mechanics, and functional strength.    Baseline 10/31: 3-/5; 07/06/2021= 3-/5; 08/22/2021=3-/5 unable to achieve full ROM yet able to withstand some resistance with testing 4/20: unable to obtainfull ROM against gravity 6/6: unable to move against gravity; 01/04/2022= Continues to have difficulty - unable to raise left LE in sidelye against gravity but able to perform supine left Hip abd with some resistance= 3-/5; 03/22/2022- Patient able to raise his left LE 3 in off mat today. 04/03/2022= Patient able to raise left LE 3" off mat in sidelye today; 05/01/2022= 05/01/2022= patient able to raise left LE from sidelye position= 5"   Time 12    Period Weeks    Status  PROGRESSING    Target Date 06/26/2022     PT LONG TERM GOAL #6   Title Patient will increase Left single leg stance time to 15 seconds or greater to increase safety in shower and independence with ADLs.    Baseline 10/31; 1.83 sec without UE support; 08/22/2021=2-3  sec  on left; 6 sec on right 4/20: RLE 10 seconds LLE unable to perform 6/6: R LE 4 seconds during BERG; 01/04/2022= left = 3 sec and right = 8 sec; 02/13/2022= 20 sec on right at best and 4 sec on left LE at best. 03/23/2022=Patient able to stand on left LE at best for 7 sec today. 04/03/2022= 27 sec on right and 12 sec on left;    Time 12    Period Weeks    Status On-going    Target Date 06/26/2022     PT LONG TERM GOAL #7   Title Patient will safely negotiate 13 steps with handrails to safely enter/exit mothers house.    Baseline 4/20: very challenging 6/6: able to  copmlete safely even managing dogs per pt report; 01/04/2022- Patient performed 16 steps with B Rail-no difficulty other than fatigue.    Time 12    Period Weeks    Status Achieved   Target Date 01/04/22    PT LONG TERM GOAL #8  Title Patient will increase six minute walk test distance to >1100 ft.  for progression to community ambulator and improve gait ability   Baseline 04/03/2022= 920 feet with 4WW - (Added another goal for endurance to continue to progress community amb distances); 05/03/2022= 960 feet with upright 4WW (patient's first time using device)   Time 12   Period Weeks   Status Progressing  Target Date 06/26/2022            Plan - 12/13/21 1340     Clinical Impression Statement Treatment continued to focus on LE- hip/knee strengthening. Patient fatigued with cable weighted activities- yet performed well- able to contract weaker left LE and push through some partial ROM and focus more on weaker hip abd/flex to assist with standing/walking. Pt will continue to benefit from skilled PT interventions to improve his overall strength, mobility and quality of life.    Personal Factors and Comorbidities Age;Comorbidity 3+;Fitness;Past/Current Experience;Time since onset of injury/illness/exacerbation    Comorbidities aroxysmal a fib, MS, lymphedema, hypothyroidism, Graves Disease, arthritis, lumbar herniated disc (L5), depression and neuropathy    Examination-Activity Limitations Bed Mobility;Bend;Caring for Others;Carry;Reach Overhead;Locomotion Level;Lift;Hygiene/Grooming;Dressing;Squat;Stairs;Stand;Toileting;Transfers    Examination-Participation Restrictions Cleaning;Community Activity;Laundry;Occupation;Shop;Volunteer;Yard Work    Merchant navy officer Evolving/Moderate complexity    Rehab Potential Fair    PT Frequency 2x / week    PT Duration 12 weeks    PT Treatment/Interventions ADLs/Self Care Home Management;Aquatic Therapy;Canalith  Repostioning;Biofeedback;Cryotherapy;Ultrasound;Traction;Moist Heat;Electrical Stimulation;DME  Instruction;Gait training;Therapeutic exercise;Therapeutic activities;Functional mobility training;Stair training;Balance training;Neuromuscular re-education;Patient/family education;Manual techniques;Orthotic Fit/Training;Compression bandaging;Passive range of motion;Vestibular;Taping;Splinting;Energy conservation;Dry needling;Visual/perceptual remediation/compensation    PT Next Visit Plan Continue with progessive balance training, Progressive LE strenthening as appropriate.    PT Home Exercise Plan Access Code: [email or web address] URL: [email or web address]  Added Bridges with BTB and encouraged more walking around the home during commerical breaks when watching TV.    Consulted and Agree with Plan of Care Patient              Ollen Bowl, PT Physical Therapist- [facility]  8:29 AM, 06/06/22

## 2022-06-07 ENCOUNTER — Ambulatory Visit: Payer: Medicare PPO

## 2022-06-07 DIAGNOSIS — I890 Lymphedema, not elsewhere classified: Secondary | ICD-10-CM | POA: Diagnosis not present

## 2022-06-07 DIAGNOSIS — R2681 Unsteadiness on feet: Secondary | ICD-10-CM

## 2022-06-07 DIAGNOSIS — R278 Other lack of coordination: Secondary | ICD-10-CM | POA: Diagnosis not present

## 2022-06-07 DIAGNOSIS — R2689 Other abnormalities of gait and mobility: Secondary | ICD-10-CM

## 2022-06-07 DIAGNOSIS — R269 Unspecified abnormalities of gait and mobility: Secondary | ICD-10-CM

## 2022-06-07 DIAGNOSIS — M6281 Muscle weakness (generalized): Secondary | ICD-10-CM | POA: Diagnosis not present

## 2022-06-07 DIAGNOSIS — R262 Difficulty in walking, not elsewhere classified: Secondary | ICD-10-CM

## 2022-06-07 NOTE — Therapy (Signed)
OUTPATIENT PHYSICAL THERAPY TREATMENT NOTE  Patient Name: Dustin Gray MRN: [id number] DOB:02/29/1956, 66 y.o., male Today's Date: 06/08/2022  PCP: [name], MD REFERRING PROVIDER: [name], MD   PT End of Session - 06/07/22 1444     Visit Number 98    Number of Visits 107    Date for PT Re-Evaluation 06/26/22    Authorization Type [employer] Auth 11/28/21-02/22/22 for 24 visits    Authorization Time Period Cert for 5/99/35-70/1/77; Recert 93/90/3009- 07/28/3005    Progress Note Due on Visit 100    PT Start Time 1436    PT Stop Time 1514    PT Time Calculation (min) 38 min    Equipment Utilized During Treatment Gait belt    Activity Tolerance Patient tolerated treatment well;No increased pain;Patient limited by fatigue    Behavior During Therapy [facility] for tasks assessed/performed                         Past Medical History:  Diagnosis Date   Abscess    groin   Arthritis    lower spine   Erectile dysfunction    Low testosterone    Lumbar herniated disc    L5   Multiple sclerosis ([place])    Staph aureus infection    Past Surgical History:  Procedure Laterality Date   CARDIOVERSION N/A 11/22/2021   Procedure: CARDIOVERSION;  Surgeon: [name], MD;  Location: [facility];  Service: Cardiovascular;  Laterality: N/A;   COLONOSCOPY WITH PROPOFOL N/A 07/04/2016   Procedure: COLONOSCOPY WITH PROPOFOL;  Surgeon: [name], MD;  Location: [place];  Service: Endoscopy;  Laterality: N/A;   [place]   POLYPECTOMY  07/04/2016   Procedure: POLYPECTOMY;  Surgeon: [name], MD;  Location: [place];  Service: Endoscopy;;   TONSILLECTOMY     Patient Active Problem List   Diagnosis Date Noted   Persistent atrial fibrillation ([place])    Graves disease    Abdominal bloating    Atrial fibrillation with RVR ([place]) 10/01/2019   Hyperthyroidism    Atrial fibrillation with rapid ventricular  response ([place]) 09/30/2019   Leg edema    Left leg cellulitis    Special screening for malignant neoplasms, colon    Polyp of sigmoid colon    Benign neoplasm of ascending colon    Rectal polyp    Herniated nucleus pulposus, L5-S1 11/09/2015   Low back pain 10/25/2015   Herniated nucleus pulposus, C5-6 right 10/06/2015   Dysesthesia 09/16/2015   Spasticity 09/16/2015   Unsteady gait 09/16/2015   Multiple sclerosis ([place]) 06/08/2011     REFERRING DIAG: R26.81 (ICD-10-CM) - Unsteadiness on feet   THERAPY DIAG:  Unsteadiness on feet  Difficulty in walking, not elsewhere classified  Abnormality of gait and mobility  Muscle weakness (generalized)  Rationale for Evaluation and Treatment Rehabilitation  PERTINENT HISTORY: Patient presents to physical therapy for unsteadiness, walking instability, fatigue. His PMH includes paroxysmal a fib, MS, lymphedema, hypothyroidism, Graves Disease, arthritis, lumbar herniated disc (L5), depression and neuropathy. Patient first developed symptoms of MS in 2005 and was diagnosed in 2006. Patient has done PT in the past, but hasn't been seen since 2020 at [facility]. Has not been doing his exercises in the past year. Walk outside to garage to ride lawnmower, walks in house. 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 Reports doing well not too sore from last visit and states walking in again today rather than taking a ride in wheelchair.    PAIN:  Are you having pain? No    TODAY'S TREATMENT:    Therex:   Seated hip flex/abd up and over 1/2 foam roll - 2 sets of 12 reps Seated single LE calf raise-2 sets of 12 reps Sit to stand x 10 with right foot on green therapad and no UE support   Resistive cable hip strengthening:   Ext 7.5 # 2 sets of 12 reps each LE  Abd 2.5#  x 12 reps each LE; 7.5# RLE x 12 reps; 4.4 lb LLE x 12 reps  Hip flex/SLR forward- right LE  7.5# 2 sets of 12 reps; Left LE 2.5 # 2 sets of 12 reps     PATIENT EDUCATION: Education details: exercise technique, body mechanics Person educated: Patient Education method: Customer service manager, verbal cues Education comprehension: verbalized understanding, returned demonstration    HOME EXERCISE PROGRAM: No changes this session   PT Short Term Goals -       PT SHORT TERM GOAL #1   Title Patient will be independent in home exercise program to improve strength/mobility for better functional independence with ADLs.    Baseline 8/9: HEP to be given next session, 10/31: patient reports compliance with HEP and would like progressions added next session. 08/22/2021=Patient verbalized that he is walking and doing his LE exercises with no questions at this time.    Time 4    Period Weeks    Status Achieved    Target Date 08/16/21              PT Long Term Goals       PT LONG TERM GOAL #1   Title Patient will increase FOTO score to equal to or greater than 70%  to demonstrate statistically significant improvement in mobility and quality of life.    Baseline 8/9: 64%; risk adjusted 34%; 03/21/21 FOTO: 45; 04/24/21 FOTO: 49% ; 08/22/2021= 56% 4/20: 60% 11/28/21: 63%; 01/04/2022= 60%. 03/22/2022= 73%   Time 12    Period Weeks    Status Achieved   Target Date 06/26/2022     PT LONG TERM GOAL #2   Title Patient will increase Berg Balance score by > 6 points to demonstrate decreased fall risk during functional activities.    Baseline 4/20: 43/56 6/6: 48/56; 713/2023= 50/56   Time 12    Period Weeks    Status Achieved   Target Date 03/29/2022     PT LONG TERM GOAL #3   Title Patient will increase 10 meter walk test to >1.24ms as to improve gait speed for better community ambulation and to reduce fall risk.    Baseline 8/9: 0.65 m/s with rollator; 10/31: 0.68 m/s with rollator. 07/06/2021= 0.71 m/s using rollator; 08/22/2021= 0.94 m/s using rollator. 4/20: 1.1 m/s with rollator     Time 12    Period Weeks    Status Achieved    Target Date 10/11/21      PT LONG TERM GOAL #4   Title Patient will increase six minute walk test distance to >1000 for progression to community ambulator and improve gait ability    Baseline 8/9: 505 ft with rollator; 03/21/21: 7787fc rollator, 04/24/21: 75562f rollator; 07/06/2021=900 feet; 08/22/2021= 935 feet with use of Rollator 44/20: 940 ft 11/28/21: 1055 feet with rollator   Time 12  Period Weeks    Status Achieved    Target Date 01/04/22      PT LONG TERM GOAL #5   Title Patient will increase glute medius strength on Left LE from able to lift off 3" (sidelye on mat) to > 5" to improve stability, gait mechanics, and functional strength.    Baseline 10/31: 3-/5; 07/06/2021= 3-/5; 08/22/2021=3-/5 unable to achieve full ROM yet able to withstand some resistance with testing 4/20: unable to obtainfull ROM against gravity 6/6: unable to move against gravity; 01/04/2022= Continues to have difficulty - unable to raise left LE in sidelye against gravity but able to perform supine left Hip abd with some resistance= 3-/5; 03/22/2022- Patient able to raise his left LE 3 in off mat today. 04/03/2022= Patient able to raise left LE 3" off mat in sidelye today; 05/01/2022= 05/01/2022= patient able to raise left LE from sidelye position= 5"   Time 12    Period Weeks    Status  PROGRESSING    Target Date 06/26/2022     PT LONG TERM GOAL #6   Title Patient will increase Left single leg stance time to 15 seconds or greater to increase safety in shower and independence with ADLs.    Baseline 10/31; 1.83 sec without UE support; 08/22/2021=2-3  sec  on left; 6 sec on right 4/20: RLE 10 seconds LLE unable to perform 6/6: R LE 4 seconds during BERG; 01/04/2022= left = 3 sec and right = 8 sec; 02/13/2022= 20 sec on right at best and 4 sec on left LE at best. 03/23/2022=Patient able to stand on left LE at best for 7 sec today. 04/03/2022= 27 sec on right and 12 sec on left;     Time 12    Period Weeks    Status On-going    Target Date 06/26/2022     PT LONG TERM GOAL #7   Title Patient will safely negotiate 13 steps with handrails to safely enter/exit mothers house.    Baseline 4/20: very challenging 6/6: able to copmlete safely even managing dogs per pt report; 01/04/2022- Patient performed 16 steps with B Rail-no difficulty other than fatigue.    Time 12    Period Weeks    Status Achieved   Target Date 01/04/22    PT LONG TERM GOAL #8  Title Patient will increase six minute walk test distance to >1100 ft.  for progression to community ambulator and improve gait ability   Baseline 04/03/2022= 920 feet with 4WW - (Added another goal for endurance to continue to progress community amb distances); 05/03/2022= 960 feet with upright 4WW (patient's first time using device)   Time 12   Period Weeks   Status Progressing  Target Date 06/26/2022            Plan - 12/13/21 1340     Clinical Impression Statement Patient responded well today with continued treatment- working on hip strength to improve his hip stability with all walking/standing. He was able to stand on left LE with less overall hyperextension of left knee today vs. Some previous visits. He was fatigued with resistive training yet able to complete 2 rounds of 12 reps today. Pt will continue to benefit from skilled PT interventions to improve his overall strength, mobility and quality of life.    Personal Factors and Comorbidities Age;Comorbidity 3+;Fitness;Past/Current Experience;Time since onset of injury/illness/exacerbation    Comorbidities aroxysmal a fib, MS, lymphedema, hypothyroidism, Graves Disease, arthritis, lumbar herniated disc (L5), depression and neuropathy  Examination-Activity Limitations Bed Mobility;Bend;Caring for Others;Carry;Reach Overhead;Locomotion Level;Lift;Hygiene/Grooming;Dressing;Squat;Stairs;Stand;Toileting;Transfers    Examination-Participation Restrictions  Cleaning;Community Activity;Laundry;Occupation;Shop;Volunteer;Yard Work    Merchant [employer] officer Evolving/Moderate complexity    Rehab Potential Fair    PT Frequency 2x / week    PT Duration 12 weeks    PT Treatment/Interventions ADLs/Self Care Home Management;Aquatic Therapy;Canalith Repostioning;Biofeedback;Cryotherapy;Ultrasound;Traction;Moist Heat;Electrical Stimulation;DME Instruction;Gait training;Therapeutic exercise;Therapeutic activities;Functional mobility training;Stair training;Balance training;Neuromuscular re-education;Patient/family education;Manual techniques;Orthotic Fit/Training;Compression bandaging;Passive range of motion;Vestibular;Taping;Splinting;Energy conservation;Dry needling;Visual/perceptual remediation/compensation    PT Next Visit Plan Continue with progessive balance training, Progressive LE strenthening as appropriate.    PT Home Exercise Plan Access Code: [email or web address] URL: [email or web address]  Added Bridges with BTB and encouraged more walking around the home during commerical breaks when watching TV.    Consulted and Agree with Plan of Care Patient              Ollen Bowl, PT Physical Therapist- [facility]  8:13 AM, 06/08/22

## 2022-06-12 ENCOUNTER — Ambulatory Visit: Payer: Medicare PPO

## 2022-06-12 DIAGNOSIS — R2689 Other abnormalities of gait and mobility: Secondary | ICD-10-CM

## 2022-06-12 DIAGNOSIS — R269 Unspecified abnormalities of gait and mobility: Secondary | ICD-10-CM

## 2022-06-12 DIAGNOSIS — M6281 Muscle weakness (generalized): Secondary | ICD-10-CM | POA: Diagnosis not present

## 2022-06-12 DIAGNOSIS — R262 Difficulty in walking, not elsewhere classified: Secondary | ICD-10-CM

## 2022-06-12 DIAGNOSIS — R278 Other lack of coordination: Secondary | ICD-10-CM

## 2022-06-12 DIAGNOSIS — R2681 Unsteadiness on feet: Secondary | ICD-10-CM | POA: Diagnosis not present

## 2022-06-12 DIAGNOSIS — I890 Lymphedema, not elsewhere classified: Secondary | ICD-10-CM | POA: Diagnosis not present

## 2022-06-12 NOTE — Therapy (Signed)
OUTPATIENT PHYSICAL THERAPY TREATMENT NOTE  Patient Name: Dustin Gray MRN: [id number] DOB:19-Oct-1955, 66 y.o., male Today's Date: 06/12/2022  PCP: [name], MD REFERRING PROVIDER: [name], MD   PT End of Session - 06/12/22 1439     Visit Number 99    Number of Visits 107    Date for PT Re-Evaluation 06/26/22    Authorization Type [employer] Auth 11/28/21-02/22/22 for 24 visits    Authorization Time Period Cert for 9/98/33-82/5/05; Recert 39/76/7341- 02/25/7901    Progress Note Due on Visit 100    PT Start Time 1436    PT Stop Time 1515    PT Time Calculation (min) 39 min    Equipment Utilized During Treatment Gait belt    Activity Tolerance Patient tolerated treatment well;No increased pain;Patient limited by fatigue    Behavior During Therapy [facility] for tasks assessed/performed                          Past Medical History:  Diagnosis Date   Abscess    groin   Arthritis    lower spine   Erectile dysfunction    Low testosterone    Lumbar herniated disc    L5   Multiple sclerosis ([place])    Staph aureus infection    Past Surgical History:  Procedure Laterality Date   CARDIOVERSION N/A 11/22/2021   Procedure: CARDIOVERSION;  Surgeon: [name], MD;  Location: [facility];  Service: Cardiovascular;  Laterality: N/A;   COLONOSCOPY WITH PROPOFOL N/A 07/04/2016   Procedure: COLONOSCOPY WITH PROPOFOL;  Surgeon: [name], MD;  Location: [place];  Service: Endoscopy;  Laterality: N/A;   [place]   POLYPECTOMY  07/04/2016   Procedure: POLYPECTOMY;  Surgeon: [name], MD;  Location: [place];  Service: Endoscopy;;   TONSILLECTOMY     Patient Active Problem List   Diagnosis Date Noted   Persistent atrial fibrillation ([place])    Graves disease    Abdominal bloating    Atrial fibrillation with RVR ([place]) 10/01/2019   Hyperthyroidism    Atrial fibrillation with rapid  ventricular response ([place]) 09/30/2019   Leg edema    Left leg cellulitis    Special screening for malignant neoplasms, colon    Polyp of sigmoid colon    Benign neoplasm of ascending colon    Rectal polyp    Herniated nucleus pulposus, L5-S1 11/09/2015   Low back pain 10/25/2015   Herniated nucleus pulposus, C5-6 right 10/06/2015   Dysesthesia 09/16/2015   Spasticity 09/16/2015   Unsteady gait 09/16/2015   Multiple sclerosis ([place]) 06/08/2011     REFERRING DIAG: R26.81 (ICD-10-CM) - Unsteadiness on feet   THERAPY DIAG:  Unsteadiness on feet  Difficulty in walking, not elsewhere classified  Abnormality of gait and mobility  Muscle weakness (generalized)  Rationale for Evaluation and Treatment Rehabilitation  PERTINENT HISTORY: Patient presents to physical therapy for unsteadiness, walking instability, fatigue. His PMH includes paroxysmal a fib, MS, lymphedema, hypothyroidism, Graves Disease, arthritis, lumbar herniated disc (L5), depression and neuropathy. Patient first developed symptoms of MS in 2005 and was diagnosed in 2006. Patient has done PT in the past, but hasn't been seen since 2020 at [facility]. Has not been doing his exercises in the past year. Walk outside to garage to ride lawnmower, walks in house. 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 Reports doing well not too sore from last visit and states walking in again today rather than taking a ride in wheelchair.    PAIN:  Are you having pain? No    TODAY'S TREATMENT:    Therex:   Reassessed hip strengthening - utilized lifting height to show true progression of strength as technically paitent would fall into the 2-/5 with MMT but by lifting his leg against gravity and measuring how high he can lift his inside heel against gravity can demo some strength difference.  Patient measured avg of 6.25 in today Left LE.   Reassessed 6 min  walk test- utilized 4 [employer]- 990 feet   Resistive cable hip strengthening:    Abd 12.5#  x 8 reps each LE; 7.5#  2 sets of 12 reps each LE  NMR:   Reassessed Single leg stance time attempting avg of 5 times each Leg with best effort on left LE = 13 sec and right = 33 sec     PATIENT EDUCATION: Education details: exercise technique, body mechanics Person educated: Patient Education method: Customer service manager, verbal cues Education comprehension: verbalized understanding, returned demonstration    HOME EXERCISE PROGRAM: No changes this session   PT Short Term Goals -       PT SHORT TERM GOAL #1   Title Patient will be independent in home exercise program to improve strength/mobility for better functional independence with ADLs.    Baseline 8/9: HEP to be given next session, 10/31: patient reports compliance with HEP and would like progressions added next session. 08/22/2021=Patient verbalized that he is walking and doing his LE exercises with no questions at this time.    Time 4    Period Weeks    Status Achieved    Target Date 08/16/21              PT Long Term Goals       PT LONG TERM GOAL #1   Title Patient will increase FOTO score to equal to or greater than 70%  to demonstrate statistically significant improvement in mobility and quality of life.    Baseline 8/9: 64%; risk adjusted 34%; 03/21/21 FOTO: 45; 04/24/21 FOTO: 49% ; 08/22/2021= 56% 4/20: 60% 11/28/21: 63%; 01/04/2022= 60%. 03/22/2022= 73%   Time 12    Period Weeks    Status Achieved   Target Date 06/26/2022     PT LONG TERM GOAL #2   Title Patient will increase Berg Balance score by > 6 points to demonstrate decreased fall risk during functional activities.    Baseline 4/20: 43/56 6/6: 48/56; 713/2023= 50/56   Time 12    Period Weeks    Status Achieved   Target Date 03/29/2022     PT LONG TERM GOAL #3   Title Patient will increase 10 meter walk test to >1.44ms as to improve gait speed for better  community ambulation and to reduce fall risk.    Baseline 8/9: 0.65 m/s with rollator; 10/31: 0.68 m/s with rollator. 07/06/2021= 0.71 m/s using rollator; 08/22/2021= 0.94 m/s using rollator. 4/20: 1.1 m/s with rollator    Time 12    Period Weeks    Status Achieved    Target Date 10/11/21      PT LONG TERM GOAL #4   Title Patient will increase six minute walk test distance to >1000 for progression to community ambulator and improve gait ability    Baseline 8/9: 505 ft with rollator; 03/21/21: 7763f  c rollator, 04/24/21: 732f c rollator; 07/06/2021=900 feet; 08/22/2021= 935 feet with use of Rollator 44/20: 940 ft 11/28/21: 1055 feet with rollator   Time 12    Period Weeks    Status Achieved    Target Date 01/04/22      PT LONG TERM GOAL #5   Title Patient will increase glute medius strength on Left LE from able to lift off 3" (sidelye on mat) to > 5" to improve stability, gait mechanics, and functional strength.    Baseline 10/31: 3-/5; 07/06/2021= 3-/5; 08/22/2021=3-/5 unable to achieve full ROM yet able to withstand some resistance with testing 4/20: unable to obtainfull ROM against gravity 6/6: unable to move against gravity; 01/04/2022= Continues to have difficulty - unable to raise left LE in sidelye against gravity but able to perform supine left Hip abd with some resistance= 3-/5; 03/22/2022- Patient able to raise his left LE 3 in off mat today. 04/03/2022= Patient able to raise left LE 3" off mat in sidelye today; 05/01/2022= 05/01/2022= patient able to raise left LE from sidelye position= 5"; 06/12/2022- Patient presents with 6.25 in sidelye Left hip abd against gravity.   Time 12    Period Weeks    Status  PROGRESSING    Target Date 06/26/2022     PT LONG TERM GOAL #6   Title Patient will increase Left single leg stance time to 15 seconds or greater to increase safety in shower and independence with ADLs.    Baseline 10/31; 1.83 sec without UE support; 08/22/2021=2-3  sec  on left; 6 sec on right  4/20: RLE 10 seconds LLE unable to perform 6/6: R LE 4 seconds during BERG; 01/04/2022= left = 3 sec and right = 8 sec; 02/13/2022= 20 sec on right at best and 4 sec on left LE at best. 03/23/2022=Patient able to stand on left LE at best for 7 sec today. 04/03/2022= 27 sec on right and 12 sec on left; 06/12/2022= 13 sec on left  and 33 sec at best on right LE   Time 12    Period Weeks    Status On-going    Target Date 06/26/2022     PT LONG TERM GOAL #7   Title Patient will safely negotiate 13 steps with handrails to safely enter/exit mothers house.    Baseline 4/20: very challenging 6/6: able to copmlete safely even managing dogs per pt report; 01/04/2022- Patient performed 16 steps with B Rail-no difficulty other than fatigue.    Time 12    Period Weeks    Status Achieved   Target Date 01/04/22    PT LONG TERM GOAL #8  Title Patient will increase six minute walk test distance to >1100 ft.  for progression to community ambulator and improve gait ability   Baseline 04/03/2022= 920 feet with 4WW - (Added another goal for endurance to continue to progress community amb distances); 05/03/2022= 960 feet with upright 4WW (patient's first time using device): 06/12/2022= 990 feet with 4WW  Time 12   Period Weeks   Status Progressing  Target Date 06/26/2022            Plan - 12/13/21 1340     Clinical Impression Statement Patient responded well over to treatment and reassessed many goals as patient reported doing well and progress note pending next visit. He did in fact present with solid progression exhibiting some improved height with left hip abd (in sidelye testing position)  and improved hip stabilization on left LE as  seen with increased SLS time on left LE today. He also demonstrated progression in 6 min walk test using 4WW vs. Previously tested upright walker. Pt will continue to benefit from skilled PT interventions to improve his overall strength, mobility and quality of life.    Personal  Factors and Comorbidities Age;Comorbidity 3+;Fitness;Past/Current Experience;Time since onset of injury/illness/exacerbation    Comorbidities aroxysmal a fib, MS, lymphedema, hypothyroidism, Graves Disease, arthritis, lumbar herniated disc (L5), depression and neuropathy    Examination-Activity Limitations Bed Mobility;Bend;Caring for Others;Carry;Reach Overhead;Locomotion Level;Lift;Hygiene/Grooming;Dressing;Squat;Stairs;Stand;Toileting;Transfers    Examination-Participation Restrictions Cleaning;Community Activity;Laundry;Occupation;Shop;Volunteer;Yard Work    Merchant navy officer Evolving/Moderate complexity    Rehab Potential Fair    PT Frequency 2x / week    PT Duration 12 weeks    PT Treatment/Interventions ADLs/Self Care Home Management;Aquatic Therapy;Canalith Repostioning;Biofeedback;Cryotherapy;Ultrasound;Traction;Moist Heat;Electrical Stimulation;DME Instruction;Gait training;Therapeutic exercise;Therapeutic activities;Functional mobility training;Stair training;Balance training;Neuromuscular re-education;Patient/family education;Manual techniques;Orthotic Fit/Training;Compression bandaging;Passive range of motion;Vestibular;Taping;Splinting;Energy conservation;Dry needling;Visual/perceptual remediation/compensation    PT Next Visit Plan Continue with progessive balance training, Progressive LE strenthening as appropriate.    PT Home Exercise Plan Access Code: [email or web address] URL: [email or web address]  Added Bridges with BTB and encouraged more walking around the home during commerical breaks when watching TV.    Consulted and Agree with Plan of Care Patient              Ollen Bowl, PT Physical Therapist- [facility]  3:29 PM, 06/12/22

## 2022-06-14 ENCOUNTER — Ambulatory Visit: Payer: Medicare PPO

## 2022-06-14 DIAGNOSIS — R262 Difficulty in walking, not elsewhere classified: Secondary | ICD-10-CM

## 2022-06-14 DIAGNOSIS — R278 Other lack of coordination: Secondary | ICD-10-CM | POA: Diagnosis not present

## 2022-06-14 DIAGNOSIS — M6281 Muscle weakness (generalized): Secondary | ICD-10-CM

## 2022-06-14 DIAGNOSIS — R269 Unspecified abnormalities of gait and mobility: Secondary | ICD-10-CM | POA: Diagnosis not present

## 2022-06-14 DIAGNOSIS — R2689 Other abnormalities of gait and mobility: Secondary | ICD-10-CM | POA: Diagnosis not present

## 2022-06-14 DIAGNOSIS — R2681 Unsteadiness on feet: Secondary | ICD-10-CM | POA: Diagnosis not present

## 2022-06-14 DIAGNOSIS — I890 Lymphedema, not elsewhere classified: Secondary | ICD-10-CM | POA: Diagnosis not present

## 2022-06-14 NOTE — Therapy (Signed)
OUTPATIENT PHYSICAL THERAPY TREATMENT NOTE/PROGRESS NOTE  Dates of Reporting period: 05/01/22 - 06/14/22  Patient Name: Dustin Gray MRN: [id number] DOB:May 13, 1956, 66 y.o., male Today's Date: 06/14/2022  PCP: [name], MD REFERRING PROVIDER: [name], MD   PT End of Session - 06/14/22 1441     Visit Number 100    Number of Visits 107    Date for PT Re-Evaluation 06/26/22    Authorization Type [employer] Auth 11/28/21-02/22/22 for 24 visits    Authorization Time Period Cert for 6/94/85-46/2/70; Recert 35/00/9381- 01/25/9936    Progress Note Due on Visit 100    PT Start Time 1438    PT Stop Time 1515    PT Time Calculation (min) 37 min    Equipment Utilized During Treatment Gait belt    Activity Tolerance Patient tolerated treatment well;No increased pain;Patient limited by fatigue    Behavior During Therapy [facility] for tasks assessed/performed                          Past Medical History:  Diagnosis Date   Abscess    groin   Arthritis    lower spine   Erectile dysfunction    Low testosterone    Lumbar herniated disc    L5   Multiple sclerosis ([place])    Staph aureus infection    Past Surgical History:  Procedure Laterality Date   CARDIOVERSION N/A 11/22/2021   Procedure: CARDIOVERSION;  Surgeon: [name], MD;  Location: [facility];  Service: Cardiovascular;  Laterality: N/A;   COLONOSCOPY WITH PROPOFOL N/A 07/04/2016   Procedure: COLONOSCOPY WITH PROPOFOL;  Surgeon: [name], MD;  Location: [facility];  Service: Endoscopy;  Laterality: N/A;   [place]   POLYPECTOMY  07/04/2016   Procedure: POLYPECTOMY;  Surgeon: [name], MD;  Location: [place];  Service: Endoscopy;;   TONSILLECTOMY     Patient Active Problem List   Diagnosis Date Noted   Persistent atrial fibrillation ([place])    Graves disease    Abdominal bloating    Atrial fibrillation with RVR ([place]) 10/01/2019    Hyperthyroidism    Atrial fibrillation with rapid ventricular response ([place]) 09/30/2019   Leg edema    Left leg cellulitis    Special screening for malignant neoplasms, colon    Polyp of sigmoid colon    Benign neoplasm of ascending colon    Rectal polyp    Herniated nucleus pulposus, L5-S1 11/09/2015   Low back pain 10/25/2015   Herniated nucleus pulposus, C5-6 right 10/06/2015   Dysesthesia 09/16/2015   Spasticity 09/16/2015   Unsteady gait 09/16/2015   Multiple sclerosis ([place]) 06/08/2011     REFERRING DIAG: R26.81 (ICD-10-CM) - Unsteadiness on feet   THERAPY DIAG:  Unsteadiness on feet  Difficulty in walking, not elsewhere classified  Abnormality of gait and mobility  Muscle weakness (generalized)  Rationale for Evaluation and Treatment Rehabilitation  PERTINENT HISTORY: Patient presents to physical therapy for unsteadiness, walking instability, fatigue. His PMH includes paroxysmal a fib, MS, lymphedema, hypothyroidism, Graves Disease, arthritis, lumbar herniated disc (L5), depression and neuropathy. Patient first developed symptoms of MS in 2005 and was diagnosed in 2006. Patient has done PT in the past, but hasn't been seen since 2020 at [facility] Has not been doing his exercises in the past year. Walk outside to garage to ride lawnmower, walks in house. 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 Denies pain. Continues to ambulate with 4WW to gym.    PAIN:  Are you having pain? No    TODAY'S TREATMENT:   Therex:  STS with RLE on airex pad: x10 no resistance. Reported easy. 2x10 with 2 KG med ball. Bouts of momentum anteriorly to assist in standing.   B heel raises on 6" step: 2x20   Alternating step ups on 6" step: x12/LE  Standing R/LLE abduction: hip Abd 17.5#  2x8/RLE, 3x8 on LLE. 9IP support     PATIENT EDUCATION: Education  details: exercise technique, body mechanics Person educated: Patient Education method: Customer service manager, verbal cues Education comprehension: verbalized understanding, returned demonstration    HOME EXERCISE PROGRAM: No changes this session   PT Short Term Goals -       PT SHORT TERM GOAL #1   Title Patient will be independent in home exercise program to improve strength/mobility for better functional independence with ADLs.    Baseline 8/9: HEP to be given next session, 10/31: patient reports compliance with HEP and would like progressions added next session. 08/22/2021=Patient verbalized that he is walking and doing his LE exercises with no questions at this time.    Time 4    Period Weeks    Status Achieved    Target Date 08/16/21              PT Long Term Goals       PT LONG TERM GOAL #1   Title Patient will increase FOTO score to equal to or greater than 70%  to demonstrate statistically significant improvement in mobility and quality of life.    Baseline 8/9: 64%; risk adjusted 34%; 03/21/21 FOTO: 45; 04/24/21 FOTO: 49% ; 08/22/2021= 56% 4/20: 60% 11/28/21: 63%; 01/04/2022= 60%. 03/22/2022= 73%   Time 12    Period Weeks    Status Achieved   Target Date 06/26/2022     PT LONG TERM GOAL #2   Title Patient will increase Berg Balance score by > 6 points to demonstrate decreased fall risk during functional activities.    Baseline 4/20: 43/56 6/6: 48/56; 713/2023= 50/56   Time 12    Period Weeks    Status Achieved   Target Date 03/29/2022     PT LONG TERM GOAL #3   Title Patient will increase 10 meter walk test to >1.3ms as to improve gait speed for better community ambulation and to reduce fall risk.    Baseline 8/9: 0.65 m/s with rollator; 10/31: 0.68 m/s with rollator. 07/06/2021= 0.71 m/s using rollator; 08/22/2021= 0.94 m/s using rollator. 4/20: 1.1 m/s with rollator    Time 12    Period Weeks    Status Achieved    Target Date 10/11/21      PT LONG TERM  GOAL #4   Title Patient will increase six minute walk test distance to >1000 for progression to community ambulator and improve gait ability    Baseline 8/9: 505 ft with rollator; 03/21/21: 7798fc rollator, 04/24/21: 75539f rollator; 07/06/2021=900 feet; 08/22/2021= 935 feet with use of Rollator 44/20: 940 ft 11/28/21: 1055 feet with rollator   Time 12    Period Weeks    Status Achieved    Target Date 01/04/22      PT LONG TERM GOAL #5   Title Patient will increase glute medius strength on Left LE  from able to lift off 3" (sidelye on mat) to > 5" to improve stability, gait mechanics, and functional strength.    Baseline 10/31: 3-/5; 07/06/2021= 3-/5; 08/22/2021=3-/5 unable to achieve full ROM yet able to withstand some resistance with testing 4/20: unable to obtainfull ROM against gravity 6/6: unable to move against gravity; 01/04/2022= Continues to have difficulty - unable to raise left LE in sidelye against gravity but able to perform supine left Hip abd with some resistance= 3-/5; 03/22/2022- Patient able to raise his left LE 3 in off mat today. 04/03/2022= Patient able to raise left LE 3" off mat in sidelye today; 05/01/2022= 05/01/2022= patient able to raise left LE from sidelye position= 5"; 06/12/2022- Patient presents with 6.25 in sidelye Left hip abd against gravity.   Time 12    Period Weeks    Status  PROGRESSING    Target Date 06/26/2022     PT LONG TERM GOAL #6   Title Patient will increase Left single leg stance time to 15 seconds or greater to increase safety in shower and independence with ADLs.    Baseline 10/31; 1.83 sec without UE support; 08/22/2021=2-3  sec  on left; 6 sec on right 4/20: RLE 10 seconds LLE unable to perform 6/6: R LE 4 seconds during BERG; 01/04/2022= left = 3 sec and right = 8 sec; 02/13/2022= 20 sec on right at best and 4 sec on left LE at best. 03/23/2022=Patient able to stand on left LE at best for 7 sec today. 04/03/2022= 27 sec on right and 12 sec on left; 06/12/2022=  13 sec on left  and 33 sec at best on right LE   Time 12    Period Weeks    Status On-going    Target Date 06/26/2022     PT LONG TERM GOAL #7   Title Patient will safely negotiate 13 steps with handrails to safely enter/exit mothers house.    Baseline 4/20: very challenging 6/6: able to copmlete safely even managing dogs per pt report; 01/04/2022- Patient performed 16 steps with B Rail-no difficulty other than fatigue.    Time 12    Period Weeks    Status Achieved   Target Date 01/04/22    PT LONG TERM GOAL #8  Title Patient will increase six minute walk test distance to >1100 ft.  for progression to community ambulator and improve gait ability   Baseline 04/03/2022= 920 feet with 4WW - (Added another goal for endurance to continue to progress community amb distances); 05/03/2022= 960 feet with upright 4WW (patient's first time using device): 06/12/2022= 990 feet with 4WW  Time 12   Period Weeks   Status Progressing  Target Date 06/26/2022            Plan - 12/13/21 1340     Clinical Impression Statement Continuing PT POC focusing on strengthening this date. Session shortened due to pt being late to session. Pt remains limited in hip abductor strength especially on LLE due to progressive weakness from MS. Pt will continue to benfit from standing strengthening to work on capacity for upright activity and strengthening in functional positions that relate to ADL completion. Pt on 100th visit warranting progress note. Per primary PT, progress note performed on visit 99 with pt making good progress towards goals. Please see that session for details.    Personal Factors and Comorbidities Age;Comorbidity 3+;Fitness;Past/Current Experience;Time since onset of injury/illness/exacerbation    Comorbidities aroxysmal a fib, MS, lymphedema, hypothyroidism, Graves Disease, arthritis, lumbar  herniated disc (L5), depression and neuropathy    Examination-Activity Limitations Bed Mobility;Bend;Caring  for Others;Carry;Reach Overhead;Locomotion Level;Lift;Hygiene/Grooming;Dressing;Squat;Stairs;Stand;Toileting;Transfers    Examination-Participation Restrictions Cleaning;Community Activity;Laundry;Occupation;Shop;Volunteer;Yard Work    Merchant navy officer Evolving/Moderate complexity    Rehab Potential Fair    PT Frequency 2x / week    PT Duration 12 weeks    PT Treatment/Interventions ADLs/Self Care Home Management;Aquatic Therapy;Canalith Repostioning;Biofeedback;Cryotherapy;Ultrasound;Traction;Moist Heat;Electrical Stimulation;DME Instruction;Gait training;Therapeutic exercise;Therapeutic activities;Functional mobility training;Stair training;Balance training;Neuromuscular re-education;Patient/family education;Manual techniques;Orthotic Fit/Training;Compression bandaging;Passive range of motion;Vestibular;Taping;Splinting;Energy conservation;Dry needling;Visual/perceptual remediation/compensation    PT Next Visit Plan Continue with progessive balance training, Progressive LE strenthening as appropriate.    PT Home Exercise Plan Access Code: [email or web address] URL: [email or web address]  Added Bridges with BTB and encouraged more walking around the home during commerical breaks when watching TV.    Consulted and Agree with Plan of Care Patient              Salem Caster. Fairly IV, PT, DPT Physical Therapist- [facility]  4:47 PM, 06/14/22

## 2022-06-19 ENCOUNTER — Ambulatory Visit: Payer: Medicare PPO

## 2022-06-21 ENCOUNTER — Ambulatory Visit: Payer: Medicare PPO

## 2022-06-21 DIAGNOSIS — I890 Lymphedema, not elsewhere classified: Secondary | ICD-10-CM | POA: Diagnosis not present

## 2022-06-21 DIAGNOSIS — R262 Difficulty in walking, not elsewhere classified: Secondary | ICD-10-CM

## 2022-06-21 DIAGNOSIS — M6281 Muscle weakness (generalized): Secondary | ICD-10-CM | POA: Diagnosis not present

## 2022-06-21 DIAGNOSIS — R278 Other lack of coordination: Secondary | ICD-10-CM | POA: Diagnosis not present

## 2022-06-21 DIAGNOSIS — R269 Unspecified abnormalities of gait and mobility: Secondary | ICD-10-CM

## 2022-06-21 DIAGNOSIS — R2681 Unsteadiness on feet: Secondary | ICD-10-CM | POA: Diagnosis not present

## 2022-06-21 DIAGNOSIS — R2689 Other abnormalities of gait and mobility: Secondary | ICD-10-CM | POA: Diagnosis not present

## 2022-06-21 NOTE — Therapy (Signed)
OUTPATIENT PHYSICAL THERAPY TREATMENT NOTE    Patient Name: Dustin Gray MRN: [id number] DOB:[birth year], 66 y.o., male Today's Date: 06/21/2022  PCP: [name], MD REFERRING PROVIDER: [name], MD   PT End of Session - 06/21/22 1434     Visit Number 101    Number of Visits 107    Date for PT Re-Evaluation 06/26/22    Authorization Type [employer] Auth 11/28/21-02/22/22 for 24 visits    Authorization Time Period Cert for 8/54/62-70/3/50; Recert 09/38/1829- 02/26/7168    Progress Note Due on Visit 110    PT Start Time 1430    PT Stop Time 1513    PT Time Calculation (min) 43 min    Equipment Utilized During Treatment Gait belt    Activity Tolerance Patient tolerated treatment well;No increased pain;Patient limited by fatigue    Behavior During Therapy [facility] for tasks assessed/performed                           Past Medical History:  Diagnosis Date   Abscess    groin   Arthritis    lower spine   Erectile dysfunction    Low testosterone    Lumbar herniated disc    L5   Multiple sclerosis ([place])    Staph aureus infection    Past Surgical History:  Procedure Laterality Date   CARDIOVERSION N/A 11/22/2021   Procedure: CARDIOVERSION;  Surgeon: [name], MD;  Location: [facility];  Service: Cardiovascular;  Laterality: N/A;   COLONOSCOPY WITH PROPOFOL N/A 07/04/2016   Procedure: COLONOSCOPY WITH PROPOFOL;  Surgeon: [name], MD;  Location: [place];  Service: Endoscopy;  Laterality: N/A;   [place]   POLYPECTOMY  07/04/2016   Procedure: POLYPECTOMY;  Surgeon: [name], MD;  Location: [place];  Service: Endoscopy;;   TONSILLECTOMY     Patient Active Problem List   Diagnosis Date Noted   Persistent atrial fibrillation ([place])    Graves disease    Abdominal bloating    Atrial fibrillation with RVR ([place]) 10/01/2019   Hyperthyroidism    Atrial fibrillation with rapid  ventricular response ([place]) 09/30/2019   Leg edema    Left leg cellulitis    Special screening for malignant neoplasms, colon    Polyp of sigmoid colon    Benign neoplasm of ascending colon    Rectal polyp    Herniated nucleus pulposus, L5-S1 11/09/2015   Low back pain 10/25/2015   Herniated nucleus pulposus, C5-6 right 10/06/2015   Dysesthesia 09/16/2015   Spasticity 09/16/2015   Unsteady gait 09/16/2015   Multiple sclerosis ([place]) 06/08/2011     REFERRING DIAG: R26.81 (ICD-10-CM) - Unsteadiness on feet   THERAPY DIAG:  Unsteadiness on feet  Difficulty in walking, not elsewhere classified  Abnormality of gait and mobility  Muscle weakness (generalized)  Rationale for Evaluation and Treatment Rehabilitation  PERTINENT HISTORY: Patient presents to physical therapy for unsteadiness, walking instability, fatigue. His PMH includes paroxysmal a fib, MS, lymphedema, hypothyroidism, Graves Disease, arthritis, lumbar herniated disc (L5), depression and neuropathy. Patient first developed symptoms of MS in 2005 and was diagnosed in 2006. Patient has done PT in the past, but hasn't been seen since 2020 at [facility]. Has not been doing his exercises in the past year. Walk outside to garage to ride lawnmower, walks in house. Retired Automotive engineer. Has a rollator at home but doesn't use it in  the house   PRECAUTIONS: Fall  SUBJECTIVE:     PAIN:  Are you having pain? No    TODAY'S TREATMENT:   Therex:  STS with RLE on airex pad: x10 no resistance. Reported easy. 2x10 with 2 KG med ball. Bouts of momentum anteriorly to assist in standing.   Hip Hike- Standing on 2" step 2 sets of 12 reps each LE  Step forward/backward over varying height object in //bars with BUE Support: 1) over 1/2 inch PVC pipe x 10 reps each LE 2) over 1/2 foam roll x 10 reps each LE 3) over orange hurdle- x 10 reps each LE  Gait in clinic using 4WW and 5lb AW LE over 300 feet today   Standing R/LLE  abduction- step out in // bars with reach overhead - diagonal to left then right x 15 reps   Standing on 2" block with right LE while dynamic march with left -2 sets of 12 reps with 5lb AW each LE     PATIENT EDUCATION: Education details: exercise technique, body mechanics Person educated: Patient Education method: Customer service manager, verbal cues Education comprehension: verbalized understanding, returned demonstration    HOME EXERCISE PROGRAM: No changes this session   PT Short Term Goals -       PT SHORT TERM GOAL #1   Title Patient will be independent in home exercise program to improve strength/mobility for better functional independence with ADLs.    Baseline 8/9: HEP to be given next session, 10/31: patient reports compliance with HEP and would like progressions added next session. 08/22/2021=Patient verbalized that he is walking and doing his LE exercises with no questions at this time.    Time 4    Period Weeks    Status Achieved    Target Date 08/16/21              PT Long Term Goals       PT LONG TERM GOAL #1   Title Patient will increase FOTO score to equal to or greater than 70%  to demonstrate statistically significant improvement in mobility and quality of life.    Baseline 8/9: 64%; risk adjusted 34%; 03/21/21 FOTO: 45; 04/24/21 FOTO: 49% ; 08/22/2021= 56% 4/20: 60% 11/28/21: 63%; 01/04/2022= 60%. 03/22/2022= 73%   Time 12    Period Weeks    Status Achieved   Target Date 06/26/2022     PT LONG TERM GOAL #2   Title Patient will increase Berg Balance score by > 6 points to demonstrate decreased fall risk during functional activities.    Baseline 4/20: 43/56 6/6: 48/56; 713/2023= 50/56   Time 12    Period Weeks    Status Achieved   Target Date 03/29/2022     PT LONG TERM GOAL #3   Title Patient will increase 10 meter walk test to >1.17ms as to improve gait speed for better community ambulation and to reduce fall risk.    Baseline 8/9: 0.65 m/s  with rollator; 10/31: 0.68 m/s with rollator. 07/06/2021= 0.71 m/s using rollator; 08/22/2021= 0.94 m/s using rollator. 4/20: 1.1 m/s with rollator    Time 12    Period Weeks    Status Achieved    Target Date 10/11/21      PT LONG TERM GOAL #4   Title Patient will increase six minute walk test distance to >1000 for progression to community ambulator and improve gait ability    Baseline 8/9: 505 ft with rollator; 03/21/21: 7763fc rollator, 04/24/21:  755f c rollator; 07/06/2021=900 feet; 08/22/2021= 935 feet with use of Rollator 44/20: 940 ft 11/28/21: 1055 feet with rollator   Time 12    Period Weeks    Status Achieved    Target Date 01/04/22      PT LONG TERM GOAL #5   Title Patient will increase glute medius strength on Left LE from able to lift off 3" (sidelye on mat) to > 5" to improve stability, gait mechanics, and functional strength.    Baseline 10/31: 3-/5; 07/06/2021= 3-/5; 08/22/2021=3-/5 unable to achieve full ROM yet able to withstand some resistance with testing 4/20: unable to obtainfull ROM against gravity 6/6: unable to move against gravity; 01/04/2022= Continues to have difficulty - unable to raise left LE in sidelye against gravity but able to perform supine left Hip abd with some resistance= 3-/5; 03/22/2022- Patient able to raise his left LE 3 in off mat today. 04/03/2022= Patient able to raise left LE 3" off mat in sidelye today; 05/01/2022= 05/01/2022= patient able to raise left LE from sidelye position= 5"; 06/12/2022- Patient presents with 6.25 in sidelye Left hip abd against gravity.   Time 12    Period Weeks    Status  PROGRESSING    Target Date 06/26/2022     PT LONG TERM GOAL #6   Title Patient will increase Left single leg stance time to 15 seconds or greater to increase safety in shower and independence with ADLs.    Baseline 10/31; 1.83 sec without UE support; 08/22/2021=2-3  sec  on left; 6 sec on right 4/20: RLE 10 seconds LLE unable to perform 6/6: R LE 4 seconds during  BERG; 01/04/2022= left = 3 sec and right = 8 sec; 02/13/2022= 20 sec on right at best and 4 sec on left LE at best. 03/23/2022=Patient able to stand on left LE at best for 7 sec today. 04/03/2022= 27 sec on right and 12 sec on left; 06/12/2022= 13 sec on left  and 33 sec at best on right LE   Time 12    Period Weeks    Status On-going    Target Date 06/26/2022     PT LONG TERM GOAL #7   Title Patient will safely negotiate 13 steps with handrails to safely enter/exit mothers house.    Baseline 4/20: very challenging 6/6: able to copmlete safely even managing dogs per pt report; 01/04/2022- Patient performed 16 steps with B Rail-no difficulty other than fatigue.    Time 12    Period Weeks    Status Achieved   Target Date 01/04/22    PT LONG TERM GOAL #8  Title Patient will increase six minute walk test distance to >1100 ft.  for progression to community ambulator and improve gait ability   Baseline 04/03/2022= 920 feet with 4WW - (Added another goal for endurance to continue to progress community amb distances); 05/03/2022= 960 feet with upright 4WW (patient's first time using device): 06/12/2022= 990 feet with 4WW  Time 12   Period Weeks   Status Progressing  Target Date 06/26/2022            Plan - 12/13/21 1340     Clinical Impression Statement Continuing PT POC focusing on strengthening this date. Patient presented with good ability to hip hike today and improving hip stabilizing with activities today. He ambulated well with resistance today only limited by fatigue.  Pt will continue to benefit from skilled PT interventions to improve his overall strength, mobility and quality of  life.     Personal Factors and Comorbidities Age;Comorbidity 3+;Fitness;Past/Current Experience;Time since onset of injury/illness/exacerbation    Comorbidities aroxysmal a fib, MS, lymphedema, hypothyroidism, Graves Disease, arthritis, lumbar herniated disc (L5), depression and neuropathy     Examination-Activity Limitations Bed Mobility;Bend;Caring for Others;Carry;Reach Overhead;Locomotion Level;Lift;Hygiene/Grooming;Dressing;Squat;Stairs;Stand;Toileting;Transfers    Examination-Participation Restrictions Cleaning;Community Activity;Laundry;Occupation;Shop;Volunteer;Yard Work    Merchant [employer] officer Evolving/Moderate complexity    Rehab Potential Fair    PT Frequency 2x / week    PT Duration 12 weeks    PT Treatment/Interventions ADLs/Self Care Home Management;Aquatic Therapy;Canalith Repostioning;Biofeedback;Cryotherapy;Ultrasound;Traction;Moist Heat;Electrical Stimulation;DME Instruction;Gait training;Therapeutic exercise;Therapeutic activities;Functional mobility training;Stair training;Balance training;Neuromuscular re-education;Patient/family education;Manual techniques;Orthotic Fit/Training;Compression bandaging;Passive range of motion;Vestibular;Taping;Splinting;Energy conservation;Dry needling;Visual/perceptual remediation/compensation    PT Next Visit Plan Continue with progessive balance training, Progressive LE strenthening as appropriate.    PT Home Exercise Plan Access Code: [email or web address] URL: [email or web address]  Added Bridges with BTB and encouraged more walking around the home during commerical breaks when watching TV.    Consulted and Agree with Plan of Care Patient             Ollen Bowl, PT Physical Therapist- [facility]  3:38 PM, 06/21/22

## 2022-06-26 ENCOUNTER — Ambulatory Visit: Payer: Medicare PPO | Attending: Neurology

## 2022-06-26 DIAGNOSIS — M6281 Muscle weakness (generalized): Secondary | ICD-10-CM | POA: Diagnosis not present

## 2022-06-26 DIAGNOSIS — R278 Other lack of coordination: Secondary | ICD-10-CM | POA: Diagnosis not present

## 2022-06-26 DIAGNOSIS — R269 Unspecified abnormalities of gait and mobility: Secondary | ICD-10-CM

## 2022-06-26 DIAGNOSIS — R262 Difficulty in walking, not elsewhere classified: Secondary | ICD-10-CM | POA: Diagnosis not present

## 2022-06-26 DIAGNOSIS — I890 Lymphedema, not elsewhere classified: Secondary | ICD-10-CM | POA: Diagnosis not present

## 2022-06-26 DIAGNOSIS — R2681 Unsteadiness on feet: Secondary | ICD-10-CM | POA: Diagnosis not present

## 2022-06-26 DIAGNOSIS — R2689 Other abnormalities of gait and mobility: Secondary | ICD-10-CM | POA: Diagnosis not present

## 2022-06-26 NOTE — Therapy (Signed)
OUTPATIENT PHYSICAL THERAPY TREATMENT NOTE    Patient Name: Dustin Gray MRN: [id number] DOB:07-12-1955, 67 y.o., male Today's Date: 06/27/2022  PCP: [name], MD REFERRING PROVIDER: [name], MD   PT End of Session - 06/26/22 1449     Visit Number 102    Number of Visits 126    Date for PT Re-Evaluation 09/18/22    Authorization Type [employer] Auth 11/28/21-02/22/22 for 24 visits    Authorization Time Period Cert for 5/46/50-35/4/65; Recert 68/05/7516- 0/0/1749    Progress Note Due on Visit 110    PT Start Time 1430    PT Stop Time 1515    PT Time Calculation (min) 45 min    Equipment Utilized During Treatment Gait belt    Activity Tolerance Patient tolerated treatment well;No increased pain;Patient limited by fatigue    Behavior During Therapy [facility] for tasks assessed/performed                           Past Medical History:  Diagnosis Date   Abscess    groin   Arthritis    lower spine   Erectile dysfunction    Low testosterone    Lumbar herniated disc    L5   Multiple sclerosis ([place])    Staph aureus infection    Past Surgical History:  Procedure Laterality Date   CARDIOVERSION N/A 11/22/2021   Procedure: CARDIOVERSION;  Surgeon: [name], MD;  Location: [facility];  Service: Cardiovascular;  Laterality: N/A;   COLONOSCOPY WITH PROPOFOL N/A 07/04/2016   Procedure: COLONOSCOPY WITH PROPOFOL;  Surgeon: [name], MD;  Location: [place];  Service: Endoscopy;  Laterality: N/A;   [place]   POLYPECTOMY  07/04/2016   Procedure: POLYPECTOMY;  Surgeon: [name], MD;  Location: [place];  Service: Endoscopy;;   TONSILLECTOMY     Patient Active Problem List   Diagnosis Date Noted   Persistent atrial fibrillation ([place])    Graves disease    Abdominal bloating    Atrial fibrillation with RVR ([place]) 10/01/2019   Hyperthyroidism    Atrial fibrillation with rapid  ventricular response ([place]) 09/30/2019   Leg edema    Left leg cellulitis    Special screening for malignant neoplasms, colon    Polyp of sigmoid colon    Benign neoplasm of ascending colon    Rectal polyp    Herniated nucleus pulposus, L5-S1 11/09/2015   Low back pain 10/25/2015   Herniated nucleus pulposus, C5-6 right 10/06/2015   Dysesthesia 09/16/2015   Spasticity 09/16/2015   Unsteady gait 09/16/2015   Multiple sclerosis ([place]) 06/08/2011     REFERRING DIAG: R26.81 (ICD-10-CM) - Unsteadiness on feet   THERAPY DIAG:  Unsteadiness on feet  Difficulty in walking, not elsewhere classified  Abnormality of gait and mobility  Muscle weakness (generalized)  Rationale for Evaluation and Treatment Rehabilitation  PERTINENT HISTORY: Patient presents to physical therapy for unsteadiness, walking instability, fatigue. His PMH includes paroxysmal a fib, MS, lymphedema, hypothyroidism, Graves Disease, arthritis, lumbar herniated disc (L5), depression and neuropathy. Patient first developed symptoms of MS in 2005 and was diagnosed in 2006. Patient has done PT in the past, but hasn't been seen since 2020 at [facility]. Has not been doing his exercises in the past year. Walk outside to garage to ride lawnmower, walks in house. 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    PAIN:  Are you having pain? No    TODAY'S TREATMENT:  Reassessed all goals for recert visit- See goal section for details  Therex:  STS x12 reps no resistance.  Hip circles around cone positioned under left foot x 20 reps.      PATIENT EDUCATION: Education details: exercise technique, body mechanics Person educated: Patient Education method: Customer service manager, verbal cues Education comprehension: verbalized understanding, returned demonstration    HOME  EXERCISE PROGRAM: No changes this session   PT Short Term Goals -       PT SHORT TERM GOAL #1   Title Patient will be independent in home exercise program to improve strength/mobility for better functional independence with ADLs.    Baseline 8/9: HEP to be given next session, 10/31: patient reports compliance with HEP and would like progressions added next session. 08/22/2021=Patient verbalized that he is walking and doing his LE exercises with no questions at this time.    Time 4    Period Weeks    Status Achieved    Target Date 08/16/21              PT Long Term Goals       PT LONG TERM GOAL #1   Title Patient will increase FOTO score to equal to or greater than 70%  to demonstrate statistically significant improvement in mobility and quality of life.    Baseline 8/9: 64%; risk adjusted 34%; 03/21/21 FOTO: 45; 04/24/21 FOTO: 49% ; 08/22/2021= 56% 4/20: 60% 11/28/21: 63%; 01/04/2022= 60%. 03/22/2022= 73%   Time 12    Period Weeks    Status Achieved   Target Date 06/26/2022     PT LONG TERM GOAL #2   Title Patient will increase Berg Balance score by > 6 points to demonstrate decreased fall risk during functional activities.    Baseline 4/20: 43/56 6/6: 48/56; 713/2023= 50/56   Time 12    Period Weeks    Status Achieved   Target Date 03/29/2022     PT LONG TERM GOAL #3   Title Patient will increase 10 meter walk test to >1.76ms as to improve gait speed for better community ambulation and to reduce fall risk.    Baseline 8/9: 0.65 m/s with rollator; 10/31: 0.68 m/s with rollator. 07/06/2021= 0.71 m/s using rollator; 08/22/2021= 0.94 m/s using rollator. 4/20: 1.1 m/s with rollator    Time 12    Period Weeks    Status Achieved    Target Date 10/11/21      PT LONG TERM GOAL #4   Title Patient will increase six minute walk test distance to >1000 for progression to community ambulator and improve gait ability    Baseline 8/9: 505 ft with rollator; 03/21/21: 7769fc rollator, 04/24/21:  75567f rollator; 07/06/2021=900 feet; 08/22/2021= 935 feet with use of Rollator 44/20: 940 ft 11/28/21: 1055 feet with rollator   Time 12    Period Weeks    Status Achieved    Target Date 01/04/22      PT LONG TERM GOAL #5   Title Patient will increase glute medius strength on Left LE from able to lift off 3" (sidelye on mat) to > 5" to improve stability, gait mechanics, and functional strength.    Baseline 10/31: 3-/5; 07/06/2021= 3-/5; 08/22/2021=3-/5 unable to achieve full ROM yet able to withstand some  resistance with testing 4/20: unable to obtainfull ROM against gravity 6/6: unable to move against gravity; 01/04/2022= Continues to have difficulty - unable to raise left LE in sidelye against gravity but able to perform supine left Hip abd with some resistance= 3-/5; 03/22/2022- Patient able to raise his left LE 3 in off mat today. 04/03/2022= Patient able to raise left LE 3" off mat in sidelye today; 05/01/2022= 05/01/2022= patient able to raise left LE from sidelye position= 5"; 06/12/2022- Patient presents with 6.25 in sidelye Left hip abd against gravity. 06/26/2022= 7in in sidelye position.    Time 12    Period Weeks    Status  PROGRESSING    Target Date 06/26/2022     PT LONG TERM GOAL #6   Title Patient will increase Left single leg stance time to 15 seconds or greater to increase safety in shower and independence with ADLs.    Baseline 10/31; 1.83 sec without UE support; 08/22/2021=2-3  sec  on left; 6 sec on right 4/20: RLE 10 seconds LLE unable to perform 6/6: R LE 4 seconds during BERG; 01/04/2022= left = 3 sec and right = 8 sec; 02/13/2022= 20 sec on right at best and 4 sec on left LE at best. 03/23/2022=Patient able to stand on left LE at best for 7 sec today. 04/03/2022= 27 sec on right and 12 sec on left; 06/12/2022= 13 sec on left  and 33 sec at best on right LE; 06/26/2021= 11 sec on left at best and 22 sec on right today   Time 12    Period Weeks    Status On-going    Target Date 09/18/2022      PT LONG TERM GOAL #7   Title Patient will safely negotiate 13 steps with handrails to safely enter/exit mothers house.    Baseline 4/20: very challenging 6/6: able to copmlete safely even managing dogs per pt report; 01/04/2022- Patient performed 16 steps with B Rail-no difficulty other than fatigue.    Time 12    Period Weeks    Status Achieved   Target Date 01/04/22    PT LONG TERM GOAL #8  Title Patient will increase six minute walk test distance to >1100 ft.  for progression to community ambulator and improve gait ability   Baseline 04/03/2022= 920 feet with 4WW - (Added another goal for endurance to continue to progress community amb distances); 05/03/2022= 960 feet with upright 4WW (patient's first time using device): 06/12/2022= 990 feet with 4WW; 06/26/2022= 1010 feet with 4WW  Time 12   Period Weeks   Status Progressing  Target Date 09/18/2022       PT LONG TERM GOAL #9  Title Patient will be able to perform functional activities such as vacuuming or using backpack sprayer with modified independence for improved household chore abilities   Baseline 06/26/2022= Patient unable to perform either task but has strong desire to return to performing if able.   Time 12   Period Weeks   Status MET  Target Date 06/26/2022   PT LONG TERM GOAL #10  Title Patient will increase glute medius strength on Left LE from able to lift off 7" (sidelye on mat) to > 10" to improve Hip stability with standing, gait mechanics, and functional strength.   Baseline 06/26/2022= 7in in sidelye position.   Time 12   Period Weeks   Status  NEW  Target Date 09/18/2022     Plan - 12/13/21 1340     Clinical Impression  Statement Patient presents with good motivation for recert. He continues to be able to demo some improvement in glute med/hip strength as seen by able to raise leg higher against gravity and ability to overall improve with single leg stance. He also ambulated well overall demo improving 6 min  walk time indicating more efficient step length. As he continues to make some progress despite degenerative condition- He will continue to benefit from skilled PT interventions to improve his overall strength, mobility and quality of  Life. Patient's condition has the potential to improve in response to therapy. Maximum improvement is yet to be obtained. The anticipated improvement is attainable and reasonaPatient reports     Personal Factors and Comorbidities Age;Comorbidity 3+;Fitness;Past/Current Experience;Time since onset of injury/illness/exacerbation    Comorbidities aroxysmal a fib, MS, lymphedema, hypothyroidism, Graves Disease, arthritis, lumbar herniated disc (L5), depression and neuropathy    Examination-Activity Limitations Bed Mobility;Bend;Caring for Others;Carry;Reach Overhead;Locomotion Level;Lift;Hygiene/Grooming;Dressing;Squat;Stairs;Stand;Toileting;Transfers    Examination-Participation Restrictions Cleaning;Community Activity;Laundry;Occupation;Shop;Volunteer;Yard Work    Merchant navy officer Evolving/Moderate complexity    Rehab Potential Fair    PT Frequency 2x / week    PT Duration 12 weeks    PT Treatment/Interventions ADLs/Self Care Home Management;Aquatic Therapy;Canalith Repostioning;Biofeedback;Cryotherapy;Ultrasound;Traction;Moist Heat;Electrical Stimulation;DME Instruction;Gait training;Therapeutic exercise;Therapeutic activities;Functional mobility training;Stair training;Balance training;Neuromuscular re-education;Patient/family education;Manual techniques;Orthotic Fit/Training;Compression bandaging;Passive range of motion;Vestibular;Taping;Splinting;Energy conservation;Dry needling;Visual/perceptual remediation/compensation    PT Next Visit Plan Continue with progessive balance training, Progressive LE strenthening as appropriate.    PT Home Exercise Plan Access Code: [email or web address] URL: [email or web address]  Added Bridges with BTB and encouraged  more walking around the home during commerical breaks when watching TV.    Consulted and Agree with Plan of Care Patient             Ollen Bowl, PT Physical Therapist- [facility]  8:38 AM, 06/27/22

## 2022-06-28 ENCOUNTER — Ambulatory Visit: Payer: Medicare PPO

## 2022-06-28 DIAGNOSIS — R278 Other lack of coordination: Secondary | ICD-10-CM | POA: Diagnosis not present

## 2022-06-28 DIAGNOSIS — R269 Unspecified abnormalities of gait and mobility: Secondary | ICD-10-CM

## 2022-06-28 DIAGNOSIS — R2681 Unsteadiness on feet: Secondary | ICD-10-CM | POA: Diagnosis not present

## 2022-06-28 DIAGNOSIS — I890 Lymphedema, not elsewhere classified: Secondary | ICD-10-CM | POA: Diagnosis not present

## 2022-06-28 DIAGNOSIS — R262 Difficulty in walking, not elsewhere classified: Secondary | ICD-10-CM | POA: Diagnosis not present

## 2022-06-28 DIAGNOSIS — M6281 Muscle weakness (generalized): Secondary | ICD-10-CM

## 2022-06-28 DIAGNOSIS — R2689 Other abnormalities of gait and mobility: Secondary | ICD-10-CM | POA: Diagnosis not present

## 2022-06-28 NOTE — Therapy (Signed)
OUTPATIENT PHYSICAL THERAPY TREATMENT NOTE    Patient Name: Dustin Gray MRN: [id number] DOB:02/25/1956, 67 y.o., male Today's Date: 06/29/2022  PCP: [name], MD REFERRING PROVIDER: [name], MD   PT End of Session - 06/28/22 1435     Visit Number 103    Number of Visits 126    Date for PT Re-Evaluation 09/18/22    Authorization Type [employer] Auth 11/28/21-02/22/22 for 24 visits    Authorization Time Period Cert for 10/02/79-19/1/47; Recert 82/95/6213- 0/01/6577; Recert 09/29/9627-11/20/4130    Progress Note Due on Visit 110    PT Start Time 1433    PT Stop Time 1515    PT Time Calculation (min) 42 min    Equipment Utilized During Treatment Gait belt    Activity Tolerance Patient tolerated treatment well;No increased pain;Patient limited by fatigue    Behavior During Therapy [facility] for tasks assessed/performed                           Past Medical History:  Diagnosis Date   Abscess    groin   Arthritis    lower spine   Erectile dysfunction    Low testosterone    Lumbar herniated disc    L5   Multiple sclerosis ([place])    Staph aureus infection    Past Surgical History:  Procedure Laterality Date   CARDIOVERSION N/A 11/22/2021   Procedure: CARDIOVERSION;  Surgeon: [name], MD;  Location: [facility];  Service: Cardiovascular;  Laterality: N/A;   COLONOSCOPY WITH PROPOFOL N/A 07/04/2016   Procedure: COLONOSCOPY WITH PROPOFOL;  Surgeon: [name], MD;  Location: [place];  Service: Endoscopy;  Laterality: N/A;   [place]   POLYPECTOMY  07/04/2016   Procedure: POLYPECTOMY;  Surgeon: [name], MD;  Location: [place];  Service: Endoscopy;;   TONSILLECTOMY     Patient Active Problem List   Diagnosis Date Noted   Persistent atrial fibrillation ([place])    Graves disease    Abdominal bloating    Atrial fibrillation with RVR ([place]) 10/01/2019   Hyperthyroidism    Atrial  fibrillation with rapid ventricular response ([place]) 09/30/2019   Leg edema    Left leg cellulitis    Special screening for malignant neoplasms, colon    Polyp of sigmoid colon    Benign neoplasm of ascending colon    Rectal polyp    Herniated nucleus pulposus, L5-S1 11/09/2015   Low back pain 10/25/2015   Herniated nucleus pulposus, C5-6 right 10/06/2015   Dysesthesia 09/16/2015   Spasticity 09/16/2015   Unsteady gait 09/16/2015   Multiple sclerosis ([place]) 06/08/2011     REFERRING DIAG: R26.81 (ICD-10-CM) - Unsteadiness on feet   THERAPY DIAG:  Unsteadiness on feet  Difficulty in walking, not elsewhere classified  Abnormality of gait and mobility  Muscle weakness (generalized)  Rationale for Evaluation and Treatment Rehabilitation  PERTINENT HISTORY: Patient presents to physical therapy for unsteadiness, walking instability, fatigue. His PMH includes paroxysmal a fib, MS, lymphedema, hypothyroidism, Graves Disease, arthritis, lumbar herniated disc (L5), depression and neuropathy. Patient first developed symptoms of MS in 2005 and was diagnosed in 2006. Patient has done PT in the past, but hasn't been seen since 2020 at [facility]. Has not been doing his exercises in the past year. Walk outside to garage to ride lawnmower, walks in house. Retired Automotive engineer. Has a rollator at home but doesn't use  it in the house   PRECAUTIONS: Fall  SUBJECTIVE:  Patient reports having a pretty good week- going to purchase a riding lawn mower later this week.  PAIN:  Are you having pain? No    TODAY'S TREATMENT:   Therex:   Hip abd - matrix cable 7.5 # each LE - 2 sets of 12 reps. Hip hike (one LE on top of 2" block) - 2 sets of 12 reps.  STS from seat of 4WW x12 reps x 2 sets  Hip swing Forward/backward with straight LE while stance leg on 2" block.  Hip circles around cone positioned under left foot x 20 reps.  Step over 1/2 foam (1 LE at a time) x 12 reps each - Focusing on  hip flex/ext strength.  Step lunge squat down and back x 2     PATIENT EDUCATION: Education details: exercise technique, body mechanics Person educated: Patient Education method: Customer service manager, verbal cues Education comprehension: verbalized understanding, returned demonstration    HOME EXERCISE PROGRAM: No changes this session   PT Short Term Goals -       PT SHORT TERM GOAL #1   Title Patient will be independent in home exercise program to improve strength/mobility for better functional independence with ADLs.    Baseline 8/9: HEP to be given next session, 10/31: patient reports compliance with HEP and would like progressions added next session. 08/22/2021=Patient verbalized that he is walking and doing his LE exercises with no questions at this time.    Time 4    Period Weeks    Status Achieved    Target Date 08/16/21              PT Long Term Goals       PT LONG TERM GOAL #1   Title Patient will increase FOTO score to equal to or greater than 70%  to demonstrate statistically significant improvement in mobility and quality of life.    Baseline 8/9: 64%; risk adjusted 34%; 03/21/21 FOTO: 45; 04/24/21 FOTO: 49% ; 08/22/2021= 56% 4/20: 60% 11/28/21: 63%; 01/04/2022= 60%. 03/22/2022= 73%   Time 12    Period Weeks    Status Achieved   Target Date 06/26/2022     PT LONG TERM GOAL #2   Title Patient will increase Berg Balance score by > 6 points to demonstrate decreased fall risk during functional activities.    Baseline 4/20: 43/56 6/6: 48/56; 713/2023= 50/56   Time 12    Period Weeks    Status Achieved   Target Date 03/29/2022     PT LONG TERM GOAL #3   Title Patient will increase 10 meter walk test to >1.33ms as to improve gait speed for better community ambulation and to reduce fall risk.    Baseline 8/9: 0.65 m/s with rollator; 10/31: 0.68 m/s with rollator. 07/06/2021= 0.71 m/s using rollator; 08/22/2021= 0.94 m/s using rollator. 4/20: 1.1 m/s with  rollator    Time 12    Period Weeks    Status Achieved    Target Date 10/11/21      PT LONG TERM GOAL #4   Title Patient will increase six minute walk test distance to >1000 for progression to community ambulator and improve gait ability    Baseline 8/9: 505 ft with rollator; 03/21/21: 7733fc rollator, 04/24/21: 75524f rollator; 07/06/2021=900 feet; 08/22/2021= 935 feet with use of Rollator 44/20: 940 ft 11/28/21: 1055 feet with rollator   Time 12    Period Weeks  Status Achieved    Target Date 01/04/22      PT LONG TERM GOAL #5   Title Patient will increase glute medius strength on Left LE from able to lift off 3" (sidelye on mat) to > 5" to improve stability, gait mechanics, and functional strength.    Baseline 10/31: 3-/5; 07/06/2021= 3-/5; 08/22/2021=3-/5 unable to achieve full ROM yet able to withstand some resistance with testing 4/20: unable to obtainfull ROM against gravity 6/6: unable to move against gravity; 01/04/2022= Continues to have difficulty - unable to raise left LE in sidelye against gravity but able to perform supine left Hip abd with some resistance= 3-/5; 03/22/2022- Patient able to raise his left LE 3 in off mat today. 04/03/2022= Patient able to raise left LE 3" off mat in sidelye today; 05/01/2022= 05/01/2022= patient able to raise left LE from sidelye position= 5"; 06/12/2022- Patient presents with 6.25 in sidelye Left hip abd against gravity. 06/26/2022= 7in in sidelye position.    Time 12    Period Weeks    Status MET   Target Date 06/26/2022     PT LONG TERM GOAL #6   Title Patient will increase Left single leg stance time to 15 seconds or greater to increase safety in shower and independence with ADLs.    Baseline 10/31; 1.83 sec without UE support; 08/22/2021=2-3  sec  on left; 6 sec on right 4/20: RLE 10 seconds LLE unable to perform 6/6: R LE 4 seconds during BERG; 01/04/2022= left = 3 sec and right = 8 sec; 02/13/2022= 20 sec on right at best and 4 sec on left LE at best.  03/23/2022=Patient able to stand on left LE at best for 7 sec today. 04/03/2022= 27 sec on right and 12 sec on left; 06/12/2022= 13 sec on left  and 33 sec at best on right LE; 06/26/2021= 11 sec on left at best and 22 sec on right today   Time 12    Period Weeks    Status On-going    Target Date 09/18/2022     PT LONG TERM GOAL #7   Title Patient will safely negotiate 13 steps with handrails to safely enter/exit mothers house.    Baseline 4/20: very challenging 6/6: able to copmlete safely even managing dogs per pt report; 01/04/2022- Patient performed 16 steps with B Rail-no difficulty other than fatigue.    Time 12    Period Weeks    Status Achieved   Target Date 01/04/22    PT LONG TERM GOAL #8  Title Patient will increase six minute walk test distance to >1100 ft.  for progression to community ambulator and improve gait ability   Baseline 04/03/2022= 920 feet with 4WW - (Added another goal for endurance to continue to progress community amb distances); 05/03/2022= 960 feet with upright 4WW (patient's first time using device): 06/12/2022= 990 feet with 4WW; 06/26/2022= 1010 feet with 4WW  Time 12   Period Weeks   Status Progressing  Target Date 09/18/2022       PT LONG TERM GOAL #9  Title Patient will be able to perform functional activities such as vacuuming or using backpack sprayer with modified independence for improved household chore abilities   Baseline 06/26/2022= Patient unable to perform either task but has strong desire to return to performing if able.   Time 12   Period Weeks   Status MET  Target Date 06/26/2022   PT LONG TERM GOAL #10  Title Patient will increase glute  medius strength on Left LE from able to lift off 7" (sidelye on mat) to > 10" to improve Hip stability with standing, gait mechanics, and functional strength.   Baseline 06/26/2022= 7in in sidelye position.   Time 12   Period Weeks   Status  NEW  Target Date 09/18/2022     Plan - 12/13/21 1340      Clinical Impression Statement Patient presents with good motivation for today's interventions. Treatment focused on LE strengthening particularly hip and patient performed well- able to hip hike better and swing LE out better today vs. Past visits. He will continue to benefit from skilled PT interventions to improve his overall strength, mobility and quality of  Life.    Personal Factors and Comorbidities Age;Comorbidity 3+;Fitness;Past/Current Experience;Time since onset of injury/illness/exacerbation    Comorbidities aroxysmal a fib, MS, lymphedema, hypothyroidism, Graves Disease, arthritis, lumbar herniated disc (L5), depression and neuropathy    Examination-Activity Limitations Bed Mobility;Bend;Caring for Others;Carry;Reach Overhead;Locomotion Level;Lift;Hygiene/Grooming;Dressing;Squat;Stairs;Stand;Toileting;Transfers    Examination-Participation Restrictions Cleaning;Community Activity;Laundry;Occupation;Shop;Volunteer;Yard Work    Merchant navy officer Evolving/Moderate complexity    Rehab Potential Fair    PT Frequency 2x / week    PT Duration 12 weeks    PT Treatment/Interventions ADLs/Self Care Home Management;Aquatic Therapy;Canalith Repostioning;Biofeedback;Cryotherapy;Ultrasound;Traction;Moist Heat;Electrical Stimulation;DME Instruction;Gait training;Therapeutic exercise;Therapeutic activities;Functional mobility training;Stair training;Balance training;Neuromuscular re-education;Patient/family education;Manual techniques;Orthotic Fit/Training;Compression bandaging;Passive range of motion;Vestibular;Taping;Splinting;Energy conservation;Dry needling;Visual/perceptual remediation/compensation    PT Next Visit Plan Continue with progessive balance training, Progressive LE strenthening as appropriate.    PT Home Exercise Plan Access Code: [email or web address] URL: [email or web address]  Added Bridges with BTB and encouraged more walking around the home during commerical breaks  when watching TV.    Consulted and Agree with Plan of Care Patient             Ollen Bowl, PT Physical Therapist- [facility]  9:08 AM, 06/29/22

## 2022-07-03 ENCOUNTER — Ambulatory Visit: Payer: Medicare PPO

## 2022-07-03 NOTE — Therapy (Incomplete)
OUTPATIENT PHYSICAL THERAPY TREATMENT NOTE    Patient Name: Dustin Gray MRN: [id number] DOB:Oct 28, 1955, 67 y.o., male Today's Date: 07/03/2022  PCP: [name], MD REFERRING PROVIDER: [name], MD                   Past Medical History:  Diagnosis Date   Abscess    groin   Arthritis    lower spine   Erectile dysfunction    Low testosterone    Lumbar herniated disc    L5   Multiple sclerosis ([place])    Staph aureus infection    Past Surgical History:  Procedure Laterality Date   CARDIOVERSION N/A 11/22/2021   Procedure: CARDIOVERSION;  Surgeon: [name], MD;  Location: [facility];  Service: Cardiovascular;  Laterality: N/A;   COLONOSCOPY WITH PROPOFOL N/A 07/04/2016   Procedure: COLONOSCOPY WITH PROPOFOL;  Surgeon: [name], MD;  Location: [place];  Service: Endoscopy;  Laterality: N/A;   [place]   POLYPECTOMY  07/04/2016   Procedure: POLYPECTOMY;  Surgeon: [name], MD;  Location: [place];  Service: Endoscopy;;   TONSILLECTOMY     Patient Active Problem List   Diagnosis Date Noted   Persistent atrial fibrillation ([place])    Graves disease    Abdominal bloating    Atrial fibrillation with RVR ([place]) 10/01/2019   Hyperthyroidism    Atrial fibrillation with rapid ventricular response ([place]) 09/30/2019   Leg edema    Left leg cellulitis    Special screening for malignant neoplasms, colon    Polyp of sigmoid colon    Benign neoplasm of ascending colon    Rectal polyp    Herniated nucleus pulposus, L5-S1 11/09/2015   Low back pain 10/25/2015   Herniated nucleus pulposus, C5-6 right 10/06/2015   Dysesthesia 09/16/2015   Spasticity 09/16/2015   Unsteady gait 09/16/2015   Multiple sclerosis ([place]) 06/08/2011     REFERRING DIAG: R26.81 (ICD-10-CM) - Unsteadiness on feet   THERAPY DIAG:  Unsteadiness on feet  Difficulty in walking, not elsewhere  classified  Abnormality of gait and mobility  Muscle weakness (generalized)  Rationale for Evaluation and Treatment Rehabilitation  PERTINENT HISTORY: Patient presents to physical therapy for unsteadiness, walking instability, fatigue. His PMH includes paroxysmal a fib, MS, lymphedema, hypothyroidism, Graves Disease, arthritis, lumbar herniated disc (L5), depression and neuropathy. Patient first developed symptoms of MS in 2005 and was diagnosed in 2006. Patient has done PT in the past, but hasn't been seen since 2020 at [facility]. Has not been doing his exercises in the past year. Walk outside to garage to ride lawnmower, walks in house. 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 No    TODAY'S TREATMENT:   Therex:   Hip abd - matrix cable 7.5 # each LE - 2 sets of 12 reps. Hip hike (one LE on top of 2" block) - 2 sets of 12 reps.  STS from seat of 4WW x12 reps x 2 sets  Hip swing Forward/backward with straight LE while stance leg on 2" block.  Hip circles around cone positioned under left foot x 20 reps.  Step over 1/2 foam (1 LE at a time) x 12 reps each - Focusing on hip flex/ext strength.  Step lunge squat down and back x 2  PATIENT EDUCATION: Education details: exercise technique, body mechanics Person educated: Patient Education method: Customer service manager, verbal cues Education comprehension: verbalized understanding, returned demonstration    HOME EXERCISE PROGRAM: No changes this session   PT Short Term Goals -       PT SHORT TERM GOAL #1   Title Patient will be independent in home exercise program to improve strength/mobility for better functional independence with ADLs.    Baseline 8/9: HEP to be given next session, 10/31: patient reports compliance with HEP and would like progressions added next session. 08/22/2021=Patient verbalized  that he is walking and doing his LE exercises with no questions at this time.    Time 4    Period Weeks    Status Achieved    Target Date 08/16/21              PT Long Term Goals       PT LONG TERM GOAL #1   Title Patient will increase FOTO score to equal to or greater than 70%  to demonstrate statistically significant improvement in mobility and quality of life.    Baseline 8/9: 64%; risk adjusted 34%; 03/21/21 FOTO: 45; 04/24/21 FOTO: 49% ; 08/22/2021= 56% 4/20: 60% 11/28/21: 63%; 01/04/2022= 60%. 03/22/2022= 73%   Time 12    Period Weeks    Status Achieved   Target Date 06/26/2022     PT LONG TERM GOAL #2   Title Patient will increase Berg Balance score by > 6 points to demonstrate decreased fall risk during functional activities.    Baseline 4/20: 43/56 6/6: 48/56; 713/2023= 50/56   Time 12    Period Weeks    Status Achieved   Target Date 03/29/2022     PT LONG TERM GOAL #3   Title Patient will increase 10 meter walk test to >1.74ms as to improve gait speed for better community ambulation and to reduce fall risk.    Baseline 8/9: 0.65 m/s with rollator; 10/31: 0.68 m/s with rollator. 07/06/2021= 0.71 m/s using rollator; 08/22/2021= 0.94 m/s using rollator. 4/20: 1.1 m/s with rollator    Time 12    Period Weeks    Status Achieved    Target Date 10/11/21      PT LONG TERM GOAL #4   Title Patient will increase six minute walk test distance to >1000 for progression to community ambulator and improve gait ability    Baseline 8/9: 505 ft with rollator; 03/21/21: 7742fc rollator, 04/24/21: 75550f rollator; 07/06/2021=900 feet; 08/22/2021= 935 feet with use of Rollator 44/20: 940 ft 11/28/21: 1055 feet with rollator   Time 12    Period Weeks    Status Achieved    Target Date 01/04/22      PT LONG TERM GOAL #5   Title Patient will increase glute medius strength on Left LE from able to lift off 3" (sidelye on mat) to > 5" to improve stability, gait mechanics, and functional strength.     Baseline 10/31: 3-/5; 07/06/2021= 3-/5; 08/22/2021=3-/5 unable to achieve full ROM yet able to withstand some resistance with testing 4/20: unable to obtainfull ROM against gravity 6/6: unable to move against gravity; 01/04/2022= Continues to have difficulty - unable to raise left LE in sidelye against gravity but able to perform supine left Hip abd with some resistance= 3-/5; 03/22/2022- Patient able to raise his left LE 3 in off mat today. 04/03/2022= Patient able to raise left LE 3" off mat in sidelye today; 05/01/2022= 05/01/2022= patient able  to raise left LE from sidelye position= 5"; 06/12/2022- Patient presents with 6.25 in sidelye Left hip abd against gravity. 06/26/2022= 7in in sidelye position.    Time 12    Period Weeks    Status MET   Target Date 06/26/2022     PT LONG TERM GOAL #6   Title Patient will increase Left single leg stance time to 15 seconds or greater to increase safety in shower and independence with ADLs.    Baseline 10/31; 1.83 sec without UE support; 08/22/2021=2-3  sec  on left; 6 sec on right 4/20: RLE 10 seconds LLE unable to perform 6/6: R LE 4 seconds during BERG; 01/04/2022= left = 3 sec and right = 8 sec; 02/13/2022= 20 sec on right at best and 4 sec on left LE at best. 03/23/2022=Patient able to stand on left LE at best for 7 sec today. 04/03/2022= 27 sec on right and 12 sec on left; 06/12/2022= 13 sec on left  and 33 sec at best on right LE; 06/26/2021= 11 sec on left at best and 22 sec on right today   Time 12    Period Weeks    Status On-going    Target Date 09/18/2022     PT LONG TERM GOAL #7   Title Patient will safely negotiate 13 steps with handrails to safely enter/exit mothers house.    Baseline 4/20: very challenging 6/6: able to copmlete safely even managing dogs per pt report; 01/04/2022- Patient performed 16 steps with B Rail-no difficulty other than fatigue.    Time 12    Period Weeks    Status Achieved   Target Date 01/04/22    PT LONG TERM GOAL #8  Title  Patient will increase six minute walk test distance to >1100 ft.  for progression to community ambulator and improve gait ability   Baseline 04/03/2022= 920 feet with 4WW - (Added another goal for endurance to continue to progress community amb distances); 05/03/2022= 960 feet with upright 4WW (patient's first time using device): 06/12/2022= 990 feet with 4WW; 06/26/2022= 1010 feet with 4WW  Time 12   Period Weeks   Status Progressing  Target Date 09/18/2022       PT LONG TERM GOAL #9  Title Patient will be able to perform functional activities such as vacuuming or using backpack sprayer with modified independence for improved household chore abilities   Baseline 06/26/2022= Patient unable to perform either task but has strong desire to return to performing if able.   Time 12   Period Weeks   Status MET  Target Date 06/26/2022   PT LONG TERM GOAL #10  Title Patient will increase glute medius strength on Left LE from able to lift off 7" (sidelye on mat) to > 10" to improve Hip stability with standing, gait mechanics, and functional strength.   Baseline 06/26/2022= 7in in sidelye position.   Time 12   Period Weeks   Status  NEW  Target Date 09/18/2022     Plan - 12/13/21 1340     Clinical Impression Statement Patient presents with good motivation for today's interventions. Treatment focused on LE strengthening particularly hip and patient performed well- able to hip hike better and swing LE out better today vs. Past visits. He will continue to benefit from skilled PT interventions to improve his overall strength, mobility and quality of  Life.    Personal Factors and Comorbidities Age;Comorbidity 3+;Fitness;Past/Current Experience;Time since onset of injury/illness/exacerbation    Comorbidities aroxysmal a fib,  MS, lymphedema, hypothyroidism, Graves Disease, arthritis, lumbar herniated disc (L5), depression and neuropathy    Examination-Activity Limitations Bed Mobility;Bend;Caring for  Others;Carry;Reach Overhead;Locomotion Level;Lift;Hygiene/Grooming;Dressing;Squat;Stairs;Stand;Toileting;Transfers    Examination-Participation Restrictions Cleaning;Community Activity;Laundry;Occupation;Shop;Volunteer;Yard Work    Merchant navy officer Evolving/Moderate complexity    Rehab Potential Fair    PT Frequency 2x / week    PT Duration 12 weeks    PT Treatment/Interventions ADLs/Self Care Home Management;Aquatic Therapy;Canalith Repostioning;Biofeedback;Cryotherapy;Ultrasound;Traction;Moist Heat;Electrical Stimulation;DME Instruction;Gait training;Therapeutic exercise;Therapeutic activities;Functional mobility training;Stair training;Balance training;Neuromuscular re-education;Patient/family education;Manual techniques;Orthotic Fit/Training;Compression bandaging;Passive range of motion;Vestibular;Taping;Splinting;Energy conservation;Dry needling;Visual/perceptual remediation/compensation    PT Next Visit Plan Continue with progessive balance training, Progressive LE strenthening as appropriate.    PT Home Exercise Plan Access Code: [email or web address] URL: [email or web address]  Added Bridges with BTB and encouraged more walking around the home during commerical breaks when watching TV.    Consulted and Agree with Plan of Care Patient             Ollen Bowl, PT Physical Therapist- [facility]  8:37 AM, 07/03/22

## 2022-07-05 ENCOUNTER — Ambulatory Visit: Payer: Medicare PPO

## 2022-07-05 DIAGNOSIS — R2689 Other abnormalities of gait and mobility: Secondary | ICD-10-CM | POA: Diagnosis not present

## 2022-07-05 DIAGNOSIS — I890 Lymphedema, not elsewhere classified: Secondary | ICD-10-CM | POA: Diagnosis not present

## 2022-07-05 DIAGNOSIS — R262 Difficulty in walking, not elsewhere classified: Secondary | ICD-10-CM

## 2022-07-05 DIAGNOSIS — R278 Other lack of coordination: Secondary | ICD-10-CM | POA: Diagnosis not present

## 2022-07-05 DIAGNOSIS — M6281 Muscle weakness (generalized): Secondary | ICD-10-CM | POA: Diagnosis not present

## 2022-07-05 DIAGNOSIS — R269 Unspecified abnormalities of gait and mobility: Secondary | ICD-10-CM

## 2022-07-05 DIAGNOSIS — R2681 Unsteadiness on feet: Secondary | ICD-10-CM | POA: Diagnosis not present

## 2022-07-05 NOTE — Therapy (Signed)
OUTPATIENT PHYSICAL THERAPY TREATMENT NOTE    Patient Name: Dustin Gray MRN: [id number] DOB:13-Nov-1955, 67 y.o., male Today's Date: 07/06/2022  PCP: [name], MD REFERRING PROVIDER: [name], MD   PT End of Session - 07/05/22 1450     Visit Number 104    Number of Visits 126    Date for PT Re-Evaluation 09/18/22    Authorization Type [employer] Auth 11/28/21-02/22/22 for 24 visits    Authorization Time Period Cert for 6/65/99-35/7/01; Recert 77/93/9030- 0/02/2329; Recert 0/12/6224-3/33/5456    Progress Note Due on Visit 110    PT Start Time 1440    PT Stop Time 1514    PT Time Calculation (min) 34 min    Equipment Utilized During Treatment Gait belt    Activity Tolerance Patient tolerated treatment well;No increased pain;Patient limited by fatigue    Behavior During Therapy [facility] for tasks assessed/performed                            Past Medical History:  Diagnosis Date   Abscess    groin   Arthritis    lower spine   Erectile dysfunction    Low testosterone    Lumbar herniated disc    L5   Multiple sclerosis ([place])    Staph aureus infection    Past Surgical History:  Procedure Laterality Date   CARDIOVERSION N/A 11/22/2021   Procedure: CARDIOVERSION;  Surgeon: [name], MD;  Location: [facility];  Service: Cardiovascular;  Laterality: N/A;   COLONOSCOPY WITH PROPOFOL N/A 07/04/2016   Procedure: COLONOSCOPY WITH PROPOFOL;  Surgeon: [name], MD;  Location: [place];  Service: Endoscopy;  Laterality: N/A;   [place]   POLYPECTOMY  07/04/2016   Procedure: POLYPECTOMY;  Surgeon: [name], MD;  Location: [place];  Service: Endoscopy;;   TONSILLECTOMY     Patient Active Problem List   Diagnosis Date Noted   Persistent atrial fibrillation ([place])    Graves disease    Abdominal bloating    Atrial fibrillation with RVR ([place]) 10/01/2019   Hyperthyroidism    Atrial  fibrillation with rapid ventricular response ([place]) 09/30/2019   Leg edema    Left leg cellulitis    Special screening for malignant neoplasms, colon    Polyp of sigmoid colon    Benign neoplasm of ascending colon    Rectal polyp    Herniated nucleus pulposus, L5-S1 11/09/2015   Low back pain 10/25/2015   Herniated nucleus pulposus, C5-6 right 10/06/2015   Dysesthesia 09/16/2015   Spasticity 09/16/2015   Unsteady gait 09/16/2015   Multiple sclerosis ([place]) 06/08/2011     REFERRING DIAG: R26.81 (ICD-10-CM) - Unsteadiness on feet   THERAPY DIAG:  Unsteadiness on feet  Difficulty in walking, not elsewhere classified  Abnormality of gait and mobility  Muscle weakness (generalized)  Rationale for Evaluation and Treatment Rehabilitation  PERTINENT HISTORY: Patient presents to physical therapy for unsteadiness, walking instability, fatigue. His PMH includes paroxysmal a fib, MS, lymphedema, hypothyroidism, Graves Disease, arthritis, lumbar herniated disc (L5), depression and neuropathy. Patient first developed symptoms of MS in 2005 and was diagnosed in 2006. Patient has done PT in the past, but hasn't been seen since 2020 at [facility]. Has not been doing his exercises in the past year. Walk outside to garage to ride lawnmower, walks in house. Retired Automotive engineer. Has a rollator at home but doesn't  use it in the house   PRECAUTIONS: Fall  SUBJECTIVE:  Patient reports doing well today without any new issues. He was able to walk down to PT clinic from his car using his 4WW again without issues other than fatigue.   PAIN:  Are you having pain? No    TODAY'S TREATMENT:   Therex:   Hip abd - matrix cable 7.5 # each LE - 4 sets of 12 reps. Hip ext matrix cable system at 7.5 # LE- 3  sets of 12 reps each LE  Standing forward/backward steps up/over 1/2 foam x 20 reps Side step up/over 1/2 foam x 20 reps.      PATIENT EDUCATION: Education details: exercise technique,  body mechanics Person educated: Patient Education method: Customer service manager, verbal cues Education comprehension: verbalized understanding, returned demonstration    HOME EXERCISE PROGRAM: No changes this session   PT Short Term Goals -       PT SHORT TERM GOAL #1   Title Patient will be independent in home exercise program to improve strength/mobility for better functional independence with ADLs.    Baseline 8/9: HEP to be given next session, 10/31: patient reports compliance with HEP and would like progressions added next session. 08/22/2021=Patient verbalized that he is walking and doing his LE exercises with no questions at this time.    Time 4    Period Weeks    Status Achieved    Target Date 08/16/21              PT Long Term Goals       PT LONG TERM GOAL #1   Title Patient will increase FOTO score to equal to or greater than 70%  to demonstrate statistically significant improvement in mobility and quality of life.    Baseline 8/9: 64%; risk adjusted 34%; 03/21/21 FOTO: 45; 04/24/21 FOTO: 49% ; 08/22/2021= 56% 4/20: 60% 11/28/21: 63%; 01/04/2022= 60%. 03/22/2022= 73%   Time 12    Period Weeks    Status Achieved   Target Date 06/26/2022     PT LONG TERM GOAL #2   Title Patient will increase Berg Balance score by > 6 points to demonstrate decreased fall risk during functional activities.    Baseline 4/20: 43/56 6/6: 48/56; 713/2023= 50/56   Time 12    Period Weeks    Status Achieved   Target Date 03/29/2022     PT LONG TERM GOAL #3   Title Patient will increase 10 meter walk test to >1.60ms as to improve gait speed for better community ambulation and to reduce fall risk.    Baseline 8/9: 0.65 m/s with rollator; 10/31: 0.68 m/s with rollator. 07/06/2021= 0.71 m/s using rollator; 08/22/2021= 0.94 m/s using rollator. 4/20: 1.1 m/s with rollator    Time 12    Period Weeks    Status Achieved    Target Date 10/11/21      PT LONG TERM GOAL #4   Title Patient will  increase six minute walk test distance to >1000 for progression to community ambulator and improve gait ability    Baseline 8/9: 505 ft with rollator; 03/21/21: 772fc rollator, 04/24/21: 7558f rollator; 07/06/2021=900 feet; 08/22/2021= 935 feet with use of Rollator 44/20: 940 ft 11/28/21: 1055 feet with rollator   Time 12    Period Weeks    Status Achieved    Target Date 01/04/22      PT LONG TERM GOAL #5   Title Patient will increase glute medius  strength on Left LE from able to lift off 3" (sidelye on mat) to > 5" to improve stability, gait mechanics, and functional strength.    Baseline 10/31: 3-/5; 07/06/2021= 3-/5; 08/22/2021=3-/5 unable to achieve full ROM yet able to withstand some resistance with testing 4/20: unable to obtainfull ROM against gravity 6/6: unable to move against gravity; 01/04/2022= Continues to have difficulty - unable to raise left LE in sidelye against gravity but able to perform supine left Hip abd with some resistance= 3-/5; 03/22/2022- Patient able to raise his left LE 3 in off mat today. 04/03/2022= Patient able to raise left LE 3" off mat in sidelye today; 05/01/2022= 05/01/2022= patient able to raise left LE from sidelye position= 5"; 06/12/2022- Patient presents with 6.25 in sidelye Left hip abd against gravity. 06/26/2022= 7in in sidelye position.    Time 12    Period Weeks    Status MET   Target Date 06/26/2022     PT LONG TERM GOAL #6   Title Patient will increase Left single leg stance time to 15 seconds or greater to increase safety in shower and independence with ADLs.    Baseline 10/31; 1.83 sec without UE support; 08/22/2021=2-3  sec  on left; 6 sec on right 4/20: RLE 10 seconds LLE unable to perform 6/6: R LE 4 seconds during BERG; 01/04/2022= left = 3 sec and right = 8 sec; 02/13/2022= 20 sec on right at best and 4 sec on left LE at best. 03/23/2022=Patient able to stand on left LE at best for 7 sec today. 04/03/2022= 27 sec on right and 12 sec on left; 06/12/2022= 13  sec on left  and 33 sec at best on right LE; 06/26/2021= 11 sec on left at best and 22 sec on right today   Time 12    Period Weeks    Status On-going    Target Date 09/18/2022     PT LONG TERM GOAL #7   Title Patient will safely negotiate 13 steps with handrails to safely enter/exit mothers house.    Baseline 4/20: very challenging 6/6: able to copmlete safely even managing dogs per pt report; 01/04/2022- Patient performed 16 steps with B Rail-no difficulty other than fatigue.    Time 12    Period Weeks    Status Achieved   Target Date 01/04/22    PT LONG TERM GOAL #8  Title Patient will increase six minute walk test distance to >1100 ft.  for progression to community ambulator and improve gait ability   Baseline 04/03/2022= 920 feet with 4WW - (Added another goal for endurance to continue to progress community amb distances); 05/03/2022= 960 feet with upright 4WW (patient's first time using device): 06/12/2022= 990 feet with 4WW; 06/26/2022= 1010 feet with 4WW  Time 12   Period Weeks   Status Progressing  Target Date 09/18/2022       PT LONG TERM GOAL #9  Title Patient will be able to perform functional activities such as vacuuming or using backpack sprayer with modified independence for improved household chore abilities   Baseline 06/26/2022= Patient unable to perform either task but has strong desire to return to performing if able.   Time 12   Period Weeks   Status MET  Target Date 06/26/2022   PT LONG TERM GOAL #10  Title Patient will increase glute medius strength on Left LE from able to lift off 7" (sidelye on mat) to > 10" to improve Hip stability with standing, gait mechanics, and  functional strength.   Baseline 06/26/2022= 7in in sidelye position.   Time 12   Period Weeks   Status  NEW  Target Date 09/18/2022     Plan - 12/13/21 1340     Clinical Impression Statement Patient presents with good motivation for today's interventions. Patient able to demo progress with  increased sets with hip/LE strengthening exercises yet required assist to adjust weights/cable. He demo improved overall LE strength with less overall struggle with hip strengthening.He will continue to benefit from skilled PT interventions to improve his overall strength, mobility and quality of  Life.    Personal Factors and Comorbidities Age;Comorbidity 3+;Fitness;Past/Current Experience;Time since onset of injury/illness/exacerbation    Comorbidities aroxysmal a fib, MS, lymphedema, hypothyroidism, Graves Disease, arthritis, lumbar herniated disc (L5), depression and neuropathy    Examination-Activity Limitations Bed Mobility;Bend;Caring for Others;Carry;Reach Overhead;Locomotion Level;Lift;Hygiene/Grooming;Dressing;Squat;Stairs;Stand;Toileting;Transfers    Examination-Participation Restrictions Cleaning;Community Activity;Laundry;Occupation;Shop;Volunteer;Yard Work    Merchant navy officer Evolving/Moderate complexity    Rehab Potential Fair    PT Frequency 2x / week    PT Duration 12 weeks    PT Treatment/Interventions ADLs/Self Care Home Management;Aquatic Therapy;Canalith Repostioning;Biofeedback;Cryotherapy;Ultrasound;Traction;Moist Heat;Electrical Stimulation;DME Instruction;Gait training;Therapeutic exercise;Therapeutic activities;Functional mobility training;Stair training;Balance training;Neuromuscular re-education;Patient/family education;Manual techniques;Orthotic Fit/Training;Compression bandaging;Passive range of motion;Vestibular;Taping;Splinting;Energy conservation;Dry needling;Visual/perceptual remediation/compensation    PT Next Visit Plan Continue with progessive balance training, Progressive LE strenthening as appropriate.    PT Home Exercise Plan Access Code: [email or web address] URL: [email or web address]  Added Bridges with BTB and encouraged more walking around the home during commerical breaks when watching TV.    Consulted and Agree with Plan of Care Patient              Ollen Bowl, PT Physical Therapist- [facility]  2:41 PM, 07/06/22

## 2022-07-10 ENCOUNTER — Ambulatory Visit: Payer: Medicare PPO

## 2022-07-10 DIAGNOSIS — R262 Difficulty in walking, not elsewhere classified: Secondary | ICD-10-CM

## 2022-07-10 DIAGNOSIS — M6281 Muscle weakness (generalized): Secondary | ICD-10-CM | POA: Diagnosis not present

## 2022-07-10 DIAGNOSIS — R2689 Other abnormalities of gait and mobility: Secondary | ICD-10-CM

## 2022-07-10 DIAGNOSIS — R269 Unspecified abnormalities of gait and mobility: Secondary | ICD-10-CM

## 2022-07-10 DIAGNOSIS — R278 Other lack of coordination: Secondary | ICD-10-CM | POA: Diagnosis not present

## 2022-07-10 DIAGNOSIS — R2681 Unsteadiness on feet: Secondary | ICD-10-CM

## 2022-07-10 DIAGNOSIS — I890 Lymphedema, not elsewhere classified: Secondary | ICD-10-CM | POA: Diagnosis not present

## 2022-07-10 NOTE — Therapy (Signed)
OUTPATIENT PHYSICAL THERAPY TREATMENT NOTE    Patient Name: Dustin Gray MRN: [id number] DOB:11-Oct-1955, 67 y.o., male Today's Date: 07/11/2022  PCP: [name], MD REFERRING PROVIDER: [name], MD   PT End of Session - 07/10/22 1440     Visit Number 105    Number of Visits 126    Date for PT Re-Evaluation 09/18/22    Authorization Type [employer] Auth 11/28/21-02/22/22 for 24 visits    Authorization Time Period Cert for 9/92/42-68/3/41; Recert 96/22/2979- 01/31/2118; Recert 09/24/7406-1/44/8185    Progress Note Due on Visit 110    PT Start Time 1433    PT Stop Time 1514    PT Time Calculation (min) 41 min    Equipment Utilized During Treatment Gait belt    Activity Tolerance Patient tolerated treatment well;No increased pain;Patient limited by fatigue    Behavior During Therapy [facility] for tasks assessed/performed                            Past Medical History:  Diagnosis Date   Abscess    groin   Arthritis    lower spine   Erectile dysfunction    Low testosterone    Lumbar herniated disc    L5   Multiple sclerosis ([place])    Staph aureus infection    Past Surgical History:  Procedure Laterality Date   CARDIOVERSION N/A 11/22/2021   Procedure: CARDIOVERSION;  Surgeon: [name], MD;  Location: [facility];  Service: Cardiovascular;  Laterality: N/A;   COLONOSCOPY WITH PROPOFOL N/A 07/04/2016   Procedure: COLONOSCOPY WITH PROPOFOL;  Surgeon: [name], MD;  Location: [place];  Service: Endoscopy;  Laterality: N/A;   [place]   POLYPECTOMY  07/04/2016   Procedure: POLYPECTOMY;  Surgeon: [name], MD;  Location: [place];  Service: Endoscopy;;   TONSILLECTOMY     Patient Active Problem List   Diagnosis Date Noted   Persistent atrial fibrillation ([place])    Graves disease    Abdominal bloating    Atrial fibrillation with RVR ([place]) 10/01/2019   Hyperthyroidism    Atrial  fibrillation with rapid ventricular response ([place]) 09/30/2019   Leg edema    Left leg cellulitis    Special screening for malignant neoplasms, colon    Polyp of sigmoid colon    Benign neoplasm of ascending colon    Rectal polyp    Herniated nucleus pulposus, L5-S1 11/09/2015   Low back pain 10/25/2015   Herniated nucleus pulposus, C5-6 right 10/06/2015   Dysesthesia 09/16/2015   Spasticity 09/16/2015   Unsteady gait 09/16/2015   Multiple sclerosis ([place]) 06/08/2011     REFERRING DIAG: R26.81 (ICD-10-CM) - Unsteadiness on feet   THERAPY DIAG:  Unsteadiness on feet  Difficulty in walking, not elsewhere classified  Abnormality of gait and mobility  Muscle weakness (generalized)  Rationale for Evaluation and Treatment Rehabilitation  PERTINENT HISTORY: Patient presents to physical therapy for unsteadiness, walking instability, fatigue. His PMH includes paroxysmal a fib, MS, lymphedema, hypothyroidism, Graves Disease, arthritis, lumbar herniated disc (L5), depression and neuropathy. Patient first developed symptoms of MS in 2005 and was diagnosed in 2006. Patient has done PT in the past, but hasn't been seen since 2020 at [facility]. Has not been doing his exercises in the past year. Walk outside to garage to ride lawnmower, walks in house. 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   PAIN:  Are you having pain? No    TODAY'S TREATMENT:   Therex:   Step tap with min BUE Support to no UE support x 12 of each (min BUE support when lifting right LE)  Step forward up/over 6" block then step back up/over to start x 12 each LE  Side step tap onto 6" block Sit to stand from rollator x 15 reps each LE  Standing hip march +Hip ER x 15 reps each LE Standing hip ext B LE x 20 reps each.   Standing ham curl to desired height of curl-  placed pvc pipe at height with instruction to curl leg until you feel the pipe.   Standing - walking in // bars- forward/backward concentrating on larger steps - down and back x 5      PATIENT EDUCATION: Education details: exercise technique, body mechanics Person educated: Patient Education method: Customer service manager, verbal cues Education comprehension: verbalized understanding, returned demonstration    HOME EXERCISE PROGRAM: No changes this session   PT Short Term Goals -       PT SHORT TERM GOAL #1   Title Patient will be independent in home exercise program to improve strength/mobility for better functional independence with ADLs.    Baseline 8/9: HEP to be given next session, 10/31: patient reports compliance with HEP and would like progressions added next session. 08/22/2021=Patient verbalized that he is walking and doing his LE exercises with no questions at this time.    Time 4    Period Weeks    Status Achieved    Target Date 08/16/21              PT Long Term Goals       PT LONG TERM GOAL #1   Title Patient will increase FOTO score to equal to or greater than 70%  to demonstrate statistically significant improvement in mobility and quality of life.    Baseline 8/9: 64%; risk adjusted 34%; 03/21/21 FOTO: 45; 04/24/21 FOTO: 49% ; 08/22/2021= 56% 4/20: 60% 11/28/21: 63%; 01/04/2022= 60%. 03/22/2022= 73%   Time 12    Period Weeks    Status Achieved   Target Date 06/26/2022     PT LONG TERM GOAL #2   Title Patient will increase Berg Balance score by > 6 points to demonstrate decreased fall risk during functional activities.    Baseline 4/20: 43/56 6/6: 48/56; 713/2023= 50/56   Time 12    Period Weeks    Status Achieved   Target Date 03/29/2022     PT LONG TERM GOAL #3   Title Patient will increase 10 meter walk test to >1.37ms as to improve gait speed for better community ambulation and to reduce fall risk.    Baseline 8/9: 0.65 m/s with rollator;  10/31: 0.68 m/s with rollator. 07/06/2021= 0.71 m/s using rollator; 08/22/2021= 0.94 m/s using rollator. 4/20: 1.1 m/s with rollator    Time 12    Period Weeks    Status Achieved    Target Date 10/11/21      PT LONG TERM GOAL #4   Title Patient will increase six minute walk test distance to >1000 for progression to community ambulator and improve gait ability    Baseline 8/9: 505 ft with rollator; 03/21/21: [id number] rollator, 04/24/21: [id number] rollator; 07/06/2021=900 feet; 08/22/2021= 935 feet  with use of Rollator 44/20: 940 ft 11/28/21: 1055 feet with rollator   Time 12    Period Weeks    Status Achieved    Target Date 01/04/22      PT LONG TERM GOAL #5   Title Patient will increase glute medius strength on Left LE from able to lift off 3" (sidelye on mat) to > 5" to improve stability, gait mechanics, and functional strength.    Baseline 10/31: 3-/5; 07/06/2021= 3-/5; 08/22/2021=3-/5 unable to achieve full ROM yet able to withstand some resistance with testing 4/20: unable to obtainfull ROM against gravity 6/6: unable to move against gravity; 01/04/2022= Continues to have difficulty - unable to raise left LE in sidelye against gravity but able to perform supine left Hip abd with some resistance= 3-/5; 03/22/2022- Patient able to raise his left LE 3 in off mat today. 04/03/2022= Patient able to raise left LE 3" off mat in sidelye today; 05/01/2022= 05/01/2022= patient able to raise left LE from sidelye position= 5"; 06/12/2022- Patient presents with 6.25 in sidelye Left hip abd against gravity. 06/26/2022= 7in in sidelye position.    Time 12    Period Weeks    Status MET   Target Date 06/26/2022     PT LONG TERM GOAL #6   Title Patient will increase Left single leg stance time to 15 seconds or greater to increase safety in shower and independence with ADLs.    Baseline 10/31; 1.83 sec without UE support; 08/22/2021=2-3  sec  on left; 6 sec on right 4/20: RLE 10 seconds LLE unable to perform 6/6: R LE 4  seconds during BERG; 01/04/2022= left = 3 sec and right = 8 sec; 02/13/2022= 20 sec on right at best and 4 sec on left LE at best. 03/23/2022=Patient able to stand on left LE at best for 7 sec today. 04/03/2022= 27 sec on right and 12 sec on left; 06/12/2022= 13 sec on left  and 33 sec at best on right LE; 06/26/2021= 11 sec on left at best and 22 sec on right today   Time 12    Period Weeks    Status On-going    Target Date 09/18/2022     PT LONG TERM GOAL #7   Title Patient will safely negotiate 13 steps with handrails to safely enter/exit mothers house.    Baseline 4/20: very challenging 6/6: able to copmlete safely even managing dogs per pt report; 01/04/2022- Patient performed 16 steps with B Rail-no difficulty other than fatigue.    Time 12    Period Weeks    Status Achieved   Target Date 01/04/22    PT LONG TERM GOAL #8  Title Patient will increase six minute walk test distance to >1100 ft.  for progression to community ambulator and improve gait ability   Baseline 04/03/2022= 920 feet with 4WW - (Added another goal for endurance to continue to progress community amb distances); 05/03/2022= 960 feet with upright 4WW (patient's first time using device): 06/12/2022= 990 feet with 4WW; 06/26/2022= 1010 feet with 4WW  Time 12   Period Weeks   Status Progressing  Target Date 09/18/2022       PT LONG TERM GOAL #9  Title Patient will be able to perform functional activities such as vacuuming or using backpack sprayer with modified independence for improved household chore abilities   Baseline 06/26/2022= Patient unable to perform either task but has strong desire to return to performing if able.   Time 12  Period Weeks   Status MET  Target Date 06/26/2022   PT LONG TERM GOAL #10  Title Patient will increase glute medius strength on Left LE from able to lift off 7" (sidelye on mat) to > 10" to improve Hip stability with standing, gait mechanics, and functional strength.   Baseline 06/26/2022=  7in in sidelye position.   Time 12   Period Weeks   Status  NEW  Target Date 09/18/2022     Plan - 12/13/21 1340     Clinical Impression Statement Patient continues to progress able to lift leg up/over 6" block and later improved hip ext ROM and ability to ER left hip during exercises. Patient will continue to benefit from skilled PT interventions to improve his overall strength, mobility and quality of  Life.    Personal Factors and Comorbidities Age;Comorbidity 3+;Fitness;Past/Current Experience;Time since onset of injury/illness/exacerbation    Comorbidities aroxysmal a fib, MS, lymphedema, hypothyroidism, Graves Disease, arthritis, lumbar herniated disc (L5), depression and neuropathy    Examination-Activity Limitations Bed Mobility;Bend;Caring for Others;Carry;Reach Overhead;Locomotion Level;Lift;Hygiene/Grooming;Dressing;Squat;Stairs;Stand;Toileting;Transfers    Examination-Participation Restrictions Cleaning;Community Activity;Laundry;Occupation;Shop;Volunteer;Yard Work    Merchant [employer] officer Evolving/Moderate complexity    Rehab Potential Fair    PT Frequency 2x / week    PT Duration 12 weeks    PT Treatment/Interventions ADLs/Self Care Home Management;Aquatic Therapy;Canalith Repostioning;Biofeedback;Cryotherapy;Ultrasound;Traction;Moist Heat;Electrical Stimulation;DME Instruction;Gait training;Therapeutic exercise;Therapeutic activities;Functional mobility training;Stair training;Balance training;Neuromuscular re-education;Patient/family education;Manual techniques;Orthotic Fit/Training;Compression bandaging;Passive range of motion;Vestibular;Taping;Splinting;Energy conservation;Dry needling;Visual/perceptual remediation/compensation    PT Next Visit Plan Continue with progessive balance training, Progressive LE strenthening as appropriate.    PT Home Exercise Plan Access Code: [email or web address] URL: [email or web address]  Added Bridges with BTB and encouraged more  walking around the home during commerical breaks when watching TV.    Consulted and Agree with Plan of Care Patient             Ollen Bowl, PT Physical Therapist- [facility]  12:51 PM, 07/11/22

## 2022-07-12 ENCOUNTER — Ambulatory Visit: Payer: Medicare PPO

## 2022-07-12 DIAGNOSIS — R262 Difficulty in walking, not elsewhere classified: Secondary | ICD-10-CM

## 2022-07-12 DIAGNOSIS — R269 Unspecified abnormalities of gait and mobility: Secondary | ICD-10-CM

## 2022-07-12 DIAGNOSIS — R278 Other lack of coordination: Secondary | ICD-10-CM

## 2022-07-12 DIAGNOSIS — M6281 Muscle weakness (generalized): Secondary | ICD-10-CM | POA: Diagnosis not present

## 2022-07-12 DIAGNOSIS — R2681 Unsteadiness on feet: Secondary | ICD-10-CM | POA: Diagnosis not present

## 2022-07-12 DIAGNOSIS — R2689 Other abnormalities of gait and mobility: Secondary | ICD-10-CM

## 2022-07-12 DIAGNOSIS — I890 Lymphedema, not elsewhere classified: Secondary | ICD-10-CM | POA: Diagnosis not present

## 2022-07-12 NOTE — Therapy (Signed)
OUTPATIENT PHYSICAL THERAPY TREATMENT NOTE    Patient Name: Dustin Gray MRN: [id number] DOB:[birth year], 67 y.o., male Today's Date: 07/12/2022  PCP: [name], MD REFERRING PROVIDER: [name], MD   PT End of Session - 07/12/22 1617     Visit Number 106    Number of Visits 126    Date for PT Re-Evaluation 09/18/22    Authorization Type [employer] Auth 11/28/21-02/22/22 for 24 visits    Authorization Time Period Cert for 7/37/10-62/6/94; Recert 85/46/2703- 5/0/0938; Recert 07/03/2991-01/07/9677    Progress Note Due on Visit 110    PT Start Time 1430    PT Stop Time 1514    PT Time Calculation (min) 44 min    Equipment Utilized During Treatment Gait belt    Activity Tolerance Patient tolerated treatment well;No increased pain;Patient limited by fatigue    Behavior During Therapy [facility] for tasks assessed/performed                             Past Medical History:  Diagnosis Date   Abscess    groin   Arthritis    lower spine   Erectile dysfunction    Low testosterone    Lumbar herniated disc    L5   Multiple sclerosis ([place])    Staph aureus infection    Past Surgical History:  Procedure Laterality Date   CARDIOVERSION N/A 11/22/2021   Procedure: CARDIOVERSION;  Surgeon: [name], MD;  Location: [facility];  Service: Cardiovascular;  Laterality: N/A;   COLONOSCOPY WITH PROPOFOL N/A 07/04/2016   Procedure: COLONOSCOPY WITH PROPOFOL;  Surgeon: [name], MD;  Location: [place];  Service: Endoscopy;  Laterality: N/A;   [place]   POLYPECTOMY  07/04/2016   Procedure: POLYPECTOMY;  Surgeon: [name], MD;  Location: [place];  Service: Endoscopy;;   TONSILLECTOMY     Patient Active Problem List   Diagnosis Date Noted   Persistent atrial fibrillation ([place])    Graves disease    Abdominal bloating    Atrial fibrillation with RVR ([place]) 10/01/2019   Hyperthyroidism     Atrial fibrillation with rapid ventricular response ([place]) 09/30/2019   Leg edema    Left leg cellulitis    Special screening for malignant neoplasms, colon    Polyp of sigmoid colon    Benign neoplasm of ascending colon    Rectal polyp    Herniated nucleus pulposus, L5-S1 11/09/2015   Low back pain 10/25/2015   Herniated nucleus pulposus, C5-6 right 10/06/2015   Dysesthesia 09/16/2015   Spasticity 09/16/2015   Unsteady gait 09/16/2015   Multiple sclerosis ([place]) 06/08/2011     REFERRING DIAG: R26.81 (ICD-10-CM) - Unsteadiness on feet   THERAPY DIAG:  Unsteadiness on feet  Difficulty in walking, not elsewhere classified  Abnormality of gait and mobility  Muscle weakness (generalized)  Rationale for Evaluation and Treatment Rehabilitation  PERTINENT HISTORY: Patient presents to physical therapy for unsteadiness, walking instability, fatigue. His PMH includes paroxysmal a fib, MS, lymphedema, hypothyroidism, Graves Disease, arthritis, lumbar herniated disc (L5), depression and neuropathy. Patient first developed symptoms of MS in 2005 and was diagnosed in 2006. Patient has done PT in the past, but hasn't been seen since 2020 at [facility]. Has not been doing his exercises in the past year. Walk outside to garage to ride lawnmower, walks in house. Retired Automotive engineer. Has a rollator at home but  doesn't use it in the house   PRECAUTIONS: Fall  SUBJECTIVE:  Patient reports continuing to do well- denies any falls. States he has consistently been walking in to PT.   PAIN:  Are you having pain? No    TODAY'S TREATMENT:   Therex:   In // bars- Walking down and back- with 4 1/2 foam rolls positioned on floor- focusing on increasing step height and length with BUE support x 10  In // bars - Side stepping over 4 of the 1/2 foam rolls- down and back x 10 (patient fatigued heading to left but able to clear obstacles)   GAIT:   Gait trainer mode on [employer] TM- 0.62 m/s x 5 min  - 0.75 cycles/sec; Step length R=0.74; L=0.38; Time on each foot = 46/ Left=54; Amb index result = 80 *Patient with difficulty advancing Left LE at times   Gait during 10 MWT- avg of 0.71 m/s using Rollator   NMR:   Activity Description: step or hand tap either side in random setting using 6 pods (3 on tray table and 3 on floor) positioned inside of // bars Activity Setting:  The Blaze Pod Random setting was chosen to enhance cognitive processing and agility, providing an unpredictable environment to simulate real-world scenarios, and fostering quick reactions and adaptability.   Number of Pods:  6 Cycles/Sets:  5 Duration (Time or Hit Count):  30 sec  Patient Stats  Reaction Time:  1) 28 2) 28 3) 27 4) 28 5) 30   Activity Description: Same as above set up except with 5 distracting pods and 1 correct Activity Setting:  The [place] setting was selected to refine precision and concentration, isolating specific muscle groups or movements to enhance overall coordination and targeted muscle engagement.  Number of Pods:  6 Cycles/Sets:  3 Duration (Time or Hit Count):  30 sec  Patient Stats  Reaction Time:  1) 26 2) 26 3) 26 hits     PATIENT EDUCATION: Education details: exercise technique, body mechanics Person educated: Patient Education method: Customer service manager, verbal cues Education comprehension: verbalized understanding, returned demonstration    HOME EXERCISE PROGRAM: No changes this session   PT Short Term Goals -       PT SHORT TERM GOAL #1   Title Patient will be independent in home exercise program to improve strength/mobility for better functional independence with ADLs.    Baseline 8/9: HEP to be given next session, 10/31: patient reports compliance with HEP and would like progressions added next session. 08/22/2021=Patient verbalized that he is walking and doing his LE exercises with no questions at this time.    Time 4    Period Weeks     Status Achieved    Target Date 08/16/21              PT Long Term Goals       PT LONG TERM GOAL #1   Title Patient will increase FOTO score to equal to or greater than 70%  to demonstrate statistically significant improvement in mobility and quality of life.    Baseline 8/9: 64%; risk adjusted 34%; 03/21/21 FOTO: 45; 04/24/21 FOTO: 49% ; 08/22/2021= 56% 4/20: 60% 11/28/21: 63%; 01/04/2022= 60%. 03/22/2022= 73%   Time 12    Period Weeks    Status Achieved   Target Date 06/26/2022     PT LONG TERM GOAL #2   Title Patient will increase Berg Balance score by > 6 points to demonstrate decreased  fall risk during functional activities.    Baseline 4/20: 43/56 6/6: 48/56; 713/2023= 50/56   Time 12    Period Weeks    Status Achieved   Target Date 03/29/2022     PT LONG TERM GOAL #3   Title Patient will increase 10 meter walk test to >1.58ms as to improve gait speed for better community ambulation and to reduce fall risk.    Baseline 8/9: 0.65 m/s with rollator; 10/31: 0.68 m/s with rollator. 07/06/2021= 0.71 m/s using rollator; 08/22/2021= 0.94 m/s using rollator. 4/20: 1.1 m/s with rollator    Time 12    Period Weeks    Status Achieved    Target Date 10/11/21      PT LONG TERM GOAL #4   Title Patient will increase six minute walk test distance to >1000 for progression to community ambulator and improve gait ability    Baseline 8/9: 505 ft with rollator; 03/21/21: [id number] rollator, 04/24/21: [id number] rollator; 07/06/2021=900 feet; 08/22/2021= 935 feet with use of Rollator 44/20: 940 ft 11/28/21: 1055 feet with rollator   Time 12    Period Weeks    Status Achieved    Target Date 01/04/22      PT LONG TERM GOAL #5   Title Patient will increase glute medius strength on Left LE from able to lift off 3" (sidelye on mat) to > 5" to improve stability, gait mechanics, and functional strength.    Baseline 10/31: 3-/5; 07/06/2021= 3-/5; 08/22/2021=3-/5 unable to achieve full ROM yet able to withstand some  resistance with testing 4/20: unable to obtainfull ROM against gravity 6/6: unable to move against gravity; 01/04/2022= Continues to have difficulty - unable to raise left LE in sidelye against gravity but able to perform supine left Hip abd with some resistance= 3-/5; 03/22/2022- Patient able to raise his left LE 3 in off mat today. 04/03/2022= Patient able to raise left LE 3" off mat in sidelye today; 05/01/2022= 05/01/2022= patient able to raise left LE from sidelye position= 5"; 06/12/2022- Patient presents with 6.25 in sidelye Left hip abd against gravity. 06/26/2022= 7in in sidelye position.    Time 12    Period Weeks    Status MET   Target Date 06/26/2022     PT LONG TERM GOAL #6   Title Patient will increase Left single leg stance time to 15 seconds or greater to increase safety in shower and independence with ADLs.    Baseline 10/31; 1.83 sec without UE support; 08/22/2021=2-3  sec  on left; 6 sec on right 4/20: RLE 10 seconds LLE unable to perform 6/6: R LE 4 seconds during BERG; 01/04/2022= left = 3 sec and right = 8 sec; 02/13/2022= 20 sec on right at best and 4 sec on left LE at best. 03/23/2022=Patient able to stand on left LE at best for 7 sec today. 04/03/2022= 27 sec on right and 12 sec on left; 06/12/2022= 13 sec on left  and 33 sec at best on right LE; 06/26/2021= 11 sec on left at best and 22 sec on right today   Time 12    Period Weeks    Status On-going    Target Date 09/18/2022     PT LONG TERM GOAL #7   Title Patient will safely negotiate 13 steps with handrails to safely enter/exit mothers house.    Baseline 4/20: very challenging 6/6: able to copmlete safely even managing dogs per pt report; 01/04/2022- Patient performed 16 steps with B Rail-no  difficulty other than fatigue.    Time 12    Period Weeks    Status Achieved   Target Date 01/04/22    PT LONG TERM GOAL #8  Title Patient will increase six minute walk test distance to >1100 ft.  for progression to community ambulator and  improve gait ability   Baseline 04/03/2022= 920 feet with 4WW - (Added another goal for endurance to continue to progress community amb distances); 05/03/2022= 960 feet with upright 4WW (patient's first time using device): 06/12/2022= 990 feet with 4WW; 06/26/2022= 1010 feet with 4WW  Time 12   Period Weeks   Status Progressing  Target Date 09/18/2022       PT LONG TERM GOAL #9  Title Patient will be able to perform functional activities such as vacuuming or using backpack sprayer with modified independence for improved household chore abilities   Baseline 06/26/2022= Patient unable to perform either task but has strong desire to return to performing if able.   Time 12   Period Weeks   Status MET  Target Date 06/26/2022   PT LONG TERM GOAL #10  Title Patient will increase glute medius strength on Left LE from able to lift off 7" (sidelye on mat) to > 10" to improve Hip stability with standing, gait mechanics, and functional strength.   Baseline 06/26/2022= 7in in sidelye position.   Time 12   Period Weeks   Status  NEW  Target Date 09/18/2022     Plan - 12/13/21 1340     Clinical Impression Statement Patient exhibited difficulty with treadmill and obvious deficits picking up left LE - yet once using his rollator he presented with improved step length and improved gait speed. The patient demonstrated significant progress while utilizing [employer], showcasing improved coordination, balance, and cognitive function. The incorporation of dual-tasking technology with color recognition and association with specific movements in Blaze Pods was strategically chosen to provide a dynamic training environment, enabling the patient to engage in simultaneous physical and cognitive tasks. This unique approach enhances not only their physical abilities but also fosters increased neural connectivity and mental awareness, contributing to a well-rounded and effective rehabilitation and training experience.  .  Patient will continue to benefit from skilled PT interventions to improve his overall strength, mobility and quality of  Life.    Personal Factors and Comorbidities Age;Comorbidity 3+;Fitness;Past/Current Experience;Time since onset of injury/illness/exacerbation    Comorbidities aroxysmal a fib, MS, lymphedema, hypothyroidism, Graves Disease, arthritis, lumbar herniated disc (L5), depression and neuropathy    Examination-Activity Limitations Bed Mobility;Bend;Caring for Others;Carry;Reach Overhead;Locomotion Level;Lift;Hygiene/Grooming;Dressing;Squat;Stairs;Stand;Toileting;Transfers    Examination-Participation Restrictions Cleaning;Community Activity;Laundry;Occupation;Shop;Volunteer;Yard Work    [employer] officer Evolving/Moderate complexity    Rehab Potential Fair    PT Frequency 2x / week    PT Duration 12 weeks    PT Treatment/Interventions ADLs/Self Care Home Management;Aquatic Therapy;Canalith Repostioning;Biofeedback;Cryotherapy;Ultrasound;Traction;Moist Heat;Electrical Stimulation;DME Instruction;Gait training;Therapeutic exercise;Therapeutic activities;Functional mobility training;Stair training;Balance training;Neuromuscular re-education;Patient/family education;Manual techniques;Orthotic Fit/Training;Compression bandaging;Passive range of motion;Vestibular;Taping;Splinting;Energy conservation;Dry needling;Visual/perceptual remediation/compensation    PT Next Visit Plan Continue with progessive balance training, Progressive LE strenthening as appropriate.    PT Home Exercise Plan Access Code: [email or web address] URL: [email or web address]  Added [place] with BTB and encouraged more walking around the home during commerical breaks when watching TV.    Consulted and Agree with Plan of Care Patient             Ollen Bowl, PT Physical Therapist- [facility]  4:48 PM, 07/12/22

## 2022-07-17 ENCOUNTER — Ambulatory Visit: Payer: Medicare PPO

## 2022-07-17 DIAGNOSIS — R278 Other lack of coordination: Secondary | ICD-10-CM

## 2022-07-17 DIAGNOSIS — R262 Difficulty in walking, not elsewhere classified: Secondary | ICD-10-CM

## 2022-07-17 DIAGNOSIS — R2681 Unsteadiness on feet: Secondary | ICD-10-CM | POA: Diagnosis not present

## 2022-07-17 DIAGNOSIS — R2689 Other abnormalities of gait and mobility: Secondary | ICD-10-CM

## 2022-07-17 DIAGNOSIS — M6281 Muscle weakness (generalized): Secondary | ICD-10-CM | POA: Diagnosis not present

## 2022-07-17 DIAGNOSIS — R269 Unspecified abnormalities of gait and mobility: Secondary | ICD-10-CM | POA: Diagnosis not present

## 2022-07-17 DIAGNOSIS — I890 Lymphedema, not elsewhere classified: Secondary | ICD-10-CM

## 2022-07-17 NOTE — Therapy (Signed)
OUTPATIENT PHYSICAL THERAPY TREATMENT NOTE    Patient Name: Dustin Gray MRN: [id number] DOB:[birth year], 67 y.o., male Today's Date: 07/17/2022  PCP: [name], MD REFERRING PROVIDER: [name], MD   PT End of Session - 07/17/22 1434     Visit Number 107    Number of Visits 126    Date for PT Re-Evaluation 09/18/22    Authorization Type [employer] Auth 11/28/21-02/22/22 for 24 visits    Authorization Time Period Cert for 7/67/34-19/3/79; Recert 02/40/9735- 08/25/9922; Recert 08/01/8339-9/62/2297    Progress Note Due on Visit 110    PT Start Time 1434    PT Stop Time 1507    PT Time Calculation (min) 33 min    Equipment Utilized During Treatment Gait belt    Activity Tolerance Patient tolerated treatment well;No increased pain;Patient limited by fatigue    Behavior During Therapy [facility] for tasks assessed/performed                             Past Medical History:  Diagnosis Date   Abscess    groin   Arthritis    lower spine   Erectile dysfunction    Low testosterone    Lumbar herniated disc    L5   Multiple sclerosis ([place])    Staph aureus infection    Past Surgical History:  Procedure Laterality Date   CARDIOVERSION N/A 11/22/2021   Procedure: CARDIOVERSION;  Surgeon: [name], MD;  Location: [facility];  Service: Cardiovascular;  Laterality: N/A;   COLONOSCOPY WITH PROPOFOL N/A 07/04/2016   Procedure: COLONOSCOPY WITH PROPOFOL;  Surgeon: [name], MD;  Location: [place];  Service: Endoscopy;  Laterality: N/A;   [place]   POLYPECTOMY  07/04/2016   Procedure: POLYPECTOMY;  Surgeon: [name], MD;  Location: [place];  Service: Endoscopy;;   TONSILLECTOMY     Patient Active Problem List   Diagnosis Date Noted   Persistent atrial fibrillation ([place])    Graves disease    Abdominal bloating    Atrial fibrillation with RVR ([place]) 10/01/2019   Hyperthyroidism     Atrial fibrillation with rapid ventricular response ([place]) 09/30/2019   Leg edema    Left leg cellulitis    Special screening for malignant neoplasms, colon    Polyp of sigmoid colon    Benign neoplasm of ascending colon    Rectal polyp    Herniated nucleus pulposus, L5-S1 11/09/2015   Low back pain 10/25/2015   Herniated nucleus pulposus, C5-6 right 10/06/2015   Dysesthesia 09/16/2015   Spasticity 09/16/2015   Unsteady gait 09/16/2015   Multiple sclerosis ([place]) 06/08/2011     REFERRING DIAG: R26.81 (ICD-10-CM) - Unsteadiness on feet   THERAPY DIAG:  Unsteadiness on feet  Difficulty in walking, not elsewhere classified  Abnormality of gait and mobility  Muscle weakness (generalized)  Rationale for Evaluation and Treatment Rehabilitation  PERTINENT HISTORY: Patient presents to physical therapy for unsteadiness, walking instability, fatigue. His PMH includes paroxysmal a fib, MS, lymphedema, hypothyroidism, Graves Disease, arthritis, lumbar herniated disc (L5), depression and neuropathy. Patient first developed symptoms of MS in 2005 and was diagnosed in 2006. Patient has done PT in the past, but hasn't been seen since 2020 at [facility]. Has not been doing his exercises in the past year. Walk outside to garage to ride lawnmower, walks in house. Retired Automotive engineer. Has a rollator at home but  doesn't use it in the house   PRECAUTIONS: Fall  SUBJECTIVE:  Patient reports continuing to do well- denies any falls. States he has consistently been walking in to PT.   PAIN:  Are you having pain? No    TODAY'S TREATMENT:   Therex:   In // bars- Walking down and back- with 4 1/2 foam rolls positioned on floor- focusing on increasing step height and length with BUE support x15 In // bars - Side stepping over 4 of the 1/2 foam rolls- down and back x15 (patient fatigued heading to left but able to clear obstacles)   Gait:   Gait trainer mode on Biodex 0.45 cycles/sec for 5  min before pt requesting to slow down, and therapist shut down machine to prevent uncontrolled descent.  All other data was lost during the shut down, but pt performed well, just became fatigued during session.     PATIENT EDUCATION: Education details: exercise technique, body mechanics Person educated: Patient Education method: Customer service manager, verbal cues Education comprehension: verbalized understanding, returned demonstration    HOME EXERCISE PROGRAM: No changes this session   PT Short Term Goals -       PT SHORT TERM GOAL #1   Title Patient will be independent in home exercise program to improve strength/mobility for better functional independence with ADLs.    Baseline 8/9: HEP to be given next session, 10/31: patient reports compliance with HEP and would like progressions added next session. 08/22/2021=Patient verbalized that he is walking and doing his LE exercises with no questions at this time.    Time 4    Period Weeks    Status Achieved    Target Date 08/16/21              PT Long Term Goals       PT LONG TERM GOAL #1   Title Patient will increase FOTO score to equal to or greater than 70%  to demonstrate statistically significant improvement in mobility and quality of life.    Baseline 8/9: 64%; risk adjusted 34%; 03/21/21 FOTO: 45; 04/24/21 FOTO: 49% ; 08/22/2021= 56% 4/20: 60% 11/28/21: 63%; 01/04/2022= 60%. 03/22/2022= 73%   Time 12    Period Weeks    Status Achieved   Target Date 06/26/2022     PT LONG TERM GOAL #2   Title Patient will increase Berg Balance score by > 6 points to demonstrate decreased fall risk during functional activities.    Baseline 4/20: 43/56 6/6: 48/56; 713/2023= 50/56   Time 12    Period Weeks    Status Achieved   Target Date 03/29/2022     PT LONG TERM GOAL #3   Title Patient will increase 10 meter walk test to >1.71ms as to improve gait speed for better community ambulation and to reduce fall risk.    Baseline 8/9:  0.65 m/s with rollator; 10/31: 0.68 m/s with rollator. 07/06/2021= 0.71 m/s using rollator; 08/22/2021= 0.94 m/s using rollator. 4/20: 1.1 m/s with rollator    Time 12    Period Weeks    Status Achieved    Target Date 10/11/21      PT LONG TERM GOAL #4   Title Patient will increase six minute walk test distance to >1000 for progression to community ambulator and improve gait ability    Baseline 8/9: 505 ft with rollator; 03/21/21: 7743fc rollator, 04/24/21: 75556f rollator; 07/06/2021=900 feet; 08/22/2021= 935 feet with use of Rollator 44/20: 940 ft 11/28/21: 1055 feet  with rollator   Time 12    Period Weeks    Status Achieved    Target Date 01/04/22      PT LONG TERM GOAL #5   Title Patient will increase glute medius strength on Left LE from able to lift off 3" (sidelye on mat) to > 5" to improve stability, gait mechanics, and functional strength.    Baseline 10/31: 3-/5; 07/06/2021= 3-/5; 08/22/2021=3-/5 unable to achieve full ROM yet able to withstand some resistance with testing 4/20: unable to obtainfull ROM against gravity 6/6: unable to move against gravity; 01/04/2022= Continues to have difficulty - unable to raise left LE in sidelye against gravity but able to perform supine left Hip abd with some resistance= 3-/5; 03/22/2022- Patient able to raise his left LE 3 in off mat today. 04/03/2022= Patient able to raise left LE 3" off mat in sidelye today; 05/01/2022= 05/01/2022= patient able to raise left LE from sidelye position= 5"; 06/12/2022- Patient presents with 6.25 in sidelye Left hip abd against gravity. 06/26/2022= 7in in sidelye position.    Time 12    Period Weeks    Status MET   Target Date 06/26/2022     PT LONG TERM GOAL #6   Title Patient will increase Left single leg stance time to 15 seconds or greater to increase safety in shower and independence with ADLs.    Baseline 10/31; 1.83 sec without UE support; 08/22/2021=2-3  sec  on left; 6 sec on right 4/20: RLE 10 seconds LLE unable to  perform 6/6: R LE 4 seconds during BERG; 01/04/2022= left = 3 sec and right = 8 sec; 02/13/2022= 20 sec on right at best and 4 sec on left LE at best. 03/23/2022=Patient able to stand on left LE at best for 7 sec today. 04/03/2022= 27 sec on right and 12 sec on left; 06/12/2022= 13 sec on left  and 33 sec at best on right LE; 06/26/2021= 11 sec on left at best and 22 sec on right today   Time 12    Period Weeks    Status On-going    Target Date 09/18/2022     PT LONG TERM GOAL #7   Title Patient will safely negotiate 13 steps with handrails to safely enter/exit mothers house.    Baseline 4/20: very challenging 6/6: able to copmlete safely even managing dogs per pt report; 01/04/2022- Patient performed 16 steps with B Rail-no difficulty other than fatigue.    Time 12    Period Weeks    Status Achieved   Target Date 01/04/22    PT LONG TERM GOAL #8  Title Patient will increase six minute walk test distance to >1100 ft.  for progression to community ambulator and improve gait ability   Baseline 04/03/2022= 920 feet with 4WW - (Added another goal for endurance to continue to progress community amb distances); 05/03/2022= 960 feet with upright 4WW (patient's first time using device): 06/12/2022= 990 feet with 4WW; 06/26/2022= 1010 feet with 4WW  Time 12   Period Weeks   Status Progressing  Target Date 09/18/2022       PT LONG TERM GOAL #9  Title Patient will be able to perform functional activities such as vacuuming or using backpack sprayer with modified independence for improved household chore abilities   Baseline 06/26/2022= Patient unable to perform either task but has strong desire to return to performing if able.   Time 12   Period Weeks   Status MET  Target Date  06/26/2022   PT LONG TERM GOAL #10  Title Patient will increase glute medius strength on Left LE from able to lift off 7" (sidelye on mat) to > 10" to improve Hip stability with standing, gait mechanics, and functional strength.    Baseline 06/26/2022= 7in in sidelye position.   Time 12   Period Weeks   Status  NEW  Target Date 09/18/2022     Plan - 12/13/21 1340     Clinical Impression Statement Session limited due to pt requesting for it to be shortened.  Pt notes that he has a video meeting with MD that he needs to be home for.  Pt participated well with therapy and put forth great effort throughout the session.  Pt did much better with the ambulation across the 1/2 foam rollers and improved his hip clearance with the task.   Pt will continue to benefit from skilled therapy to address remaining deficits in order to improve overall QoL and return to PLOF.       Personal Factors and Comorbidities Age;Comorbidity 3+;Fitness;Past/Current Experience;Time since onset of injury/illness/exacerbation    Comorbidities aroxysmal a fib, MS, lymphedema, hypothyroidism, Graves Disease, arthritis, lumbar herniated disc (L5), depression and neuropathy    Examination-Activity Limitations Bed Mobility;Bend;Caring for Others;Carry;Reach Overhead;Locomotion Level;Lift;Hygiene/Grooming;Dressing;Squat;Stairs;Stand;Toileting;Transfers    Examination-Participation Restrictions Cleaning;Community Activity;Laundry;Occupation;Shop;Volunteer;Yard Work    Merchant navy officer Evolving/Moderate complexity    Rehab Potential Fair    PT Frequency 2x / week    PT Duration 12 weeks    PT Treatment/Interventions ADLs/Self Care Home Management;Aquatic Therapy;Canalith Repostioning;Biofeedback;Cryotherapy;Ultrasound;Traction;Moist Heat;Electrical Stimulation;DME Instruction;Gait training;Therapeutic exercise;Therapeutic activities;Functional mobility training;Stair training;Balance training;Neuromuscular re-education;Patient/family education;Manual techniques;Orthotic Fit/Training;Compression bandaging;Passive range of motion;Vestibular;Taping;Splinting;Energy conservation;Dry needling;Visual/perceptual remediation/compensation    PT Next  Visit Plan Continue with progessive balance training, Progressive LE strenthening as appropriate.    PT Home Exercise Plan Access Code: [email or web address] URL: [email or web address]  Added Bridges with BTB and encouraged more walking around the home during commerical breaks when watching TV.    Consulted and Agree with Plan of Care Patient             Gwenlyn Saran, PT, DPT Physical Therapist- [facility]  07/17/22, 5:09 PM

## 2022-07-19 ENCOUNTER — Ambulatory Visit: Payer: Medicare PPO

## 2022-07-19 DIAGNOSIS — R2681 Unsteadiness on feet: Secondary | ICD-10-CM | POA: Diagnosis not present

## 2022-07-19 DIAGNOSIS — R269 Unspecified abnormalities of gait and mobility: Secondary | ICD-10-CM

## 2022-07-19 DIAGNOSIS — R278 Other lack of coordination: Secondary | ICD-10-CM

## 2022-07-19 DIAGNOSIS — R262 Difficulty in walking, not elsewhere classified: Secondary | ICD-10-CM

## 2022-07-19 DIAGNOSIS — R2689 Other abnormalities of gait and mobility: Secondary | ICD-10-CM

## 2022-07-19 DIAGNOSIS — M6281 Muscle weakness (generalized): Secondary | ICD-10-CM | POA: Diagnosis not present

## 2022-07-19 DIAGNOSIS — I890 Lymphedema, not elsewhere classified: Secondary | ICD-10-CM | POA: Diagnosis not present

## 2022-07-19 NOTE — Therapy (Signed)
OUTPATIENT PHYSICAL THERAPY TREATMENT NOTE    Patient Name: Dustin Gray MRN: [id number] DOB:[birth year], 67 y.o., male Today's Date: 07/19/2022  PCP: [name], MD REFERRING PROVIDER: [name], MD   PT End of Session - 07/19/22 1440     Visit Number 108    Number of Visits 126    Date for PT Re-Evaluation 09/18/22    Authorization Type [employer] Auth 11/28/21-02/22/22 for 24 visits    Authorization Time Period Cert for 3/66/44-08/26/72; Recert 25/95/6387- 10/29/4330; Recert 02/28/1883-1/66/0630    Progress Note Due on Visit 110    PT Start Time 1431    PT Stop Time 1514    PT Time Calculation (min) 43 min    Equipment Utilized During Treatment Gait belt    Activity Tolerance Patient tolerated treatment well;No increased pain;Patient limited by fatigue    Behavior During Therapy [facility] for tasks assessed/performed                    Past Medical History:  Diagnosis Date   Abscess    groin   Arthritis    lower spine   Erectile dysfunction    Low testosterone    Lumbar herniated disc    L5   Multiple sclerosis ([place])    Staph aureus infection    Past Surgical History:  Procedure Laterality Date   CARDIOVERSION N/A 11/22/2021   Procedure: CARDIOVERSION;  Surgeon: [name], MD;  Location: [facility];  Service: Cardiovascular;  Laterality: N/A;   COLONOSCOPY WITH PROPOFOL N/A 07/04/2016   Procedure: COLONOSCOPY WITH PROPOFOL;  Surgeon: [name], MD;  Location: [place];  Service: Endoscopy;  Laterality: N/A;   [place]   POLYPECTOMY  07/04/2016   Procedure: POLYPECTOMY;  Surgeon: [name], MD;  Location: [place];  Service: Endoscopy;;   TONSILLECTOMY     Patient Active Problem List   Diagnosis Date Noted   Persistent atrial fibrillation ([place])    Graves disease    Abdominal bloating    Atrial fibrillation with RVR ([place]) 10/01/2019   Hyperthyroidism    Atrial fibrillation  with rapid ventricular response ([place]) 09/30/2019   Leg edema    Left leg cellulitis    Special screening for malignant neoplasms, colon    Polyp of sigmoid colon    Benign neoplasm of ascending colon    Rectal polyp    Herniated nucleus pulposus, L5-S1 11/09/2015   Low back pain 10/25/2015   Herniated nucleus pulposus, C5-6 right 10/06/2015   Dysesthesia 09/16/2015   Spasticity 09/16/2015   Unsteady gait 09/16/2015   Multiple sclerosis ([place]) 06/08/2011     REFERRING DIAG: R26.81 (ICD-10-CM) - Unsteadiness on feet   THERAPY DIAG:  Unsteadiness on feet  Difficulty in walking, not elsewhere classified  Abnormality of gait and mobility  Muscle weakness (generalized)  Rationale for Evaluation and Treatment Rehabilitation  PERTINENT HISTORY: Patient presents to physical therapy for unsteadiness, walking instability, fatigue. His PMH includes paroxysmal a fib, MS, lymphedema, hypothyroidism, Graves Disease, arthritis, lumbar herniated disc (L5), depression and neuropathy. Patient first developed symptoms of MS in 2005 and was diagnosed in 2006. Patient has done PT in the past, but hasn't been seen since 2020 at [facility]. Has not been doing his exercises in the past year. Walk outside to garage to ride lawnmower, walks in house. 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 Overall doing well since previous session.   PAIN:  Are you having pain? No    TODAY'S TREATMENT:    Neuro Re-ED:  Biodex for 5 minutes. .45 cycles/ min with step lengths at 65 cm.     Avg walking speed: .52 m/s    Step cycle: .60 cycles/sec    R step length: .48    L step length: .40    X4  bolsters in // bars for equal step lengths: x10 laps forwards, x10 laps laterally. Seated rest b/t forwards and lateral steps.    Forward and lateral step ups onto BOSU ball round side up. X12/LE per plane of motion. BUE  support.      PATIENT EDUCATION: Education details: exercise technique, body mechanics Person educated: Patient Education method: Customer service manager, verbal cues Education comprehension: verbalized understanding, returned demonstration    HOME EXERCISE PROGRAM: No changes this session   PT Short Term Goals -       PT SHORT TERM GOAL #1   Title Patient will be independent in home exercise program to improve strength/mobility for better functional independence with ADLs.    Baseline 8/9: HEP to be given next session, 10/31: patient reports compliance with HEP and would like progressions added next session. 08/22/2021=Patient verbalized that he is walking and doing his LE exercises with no questions at this time.    Time 4    Period Weeks    Status Achieved    Target Date 08/16/21              PT Long Term Goals       PT LONG TERM GOAL #1   Title Patient will increase FOTO score to equal to or greater than 70%  to demonstrate statistically significant improvement in mobility and quality of life.    Baseline 8/9: 64%; risk adjusted 34%; 03/21/21 FOTO: 45; 04/24/21 FOTO: 49% ; 08/22/2021= 56% 4/20: 60% 11/28/21: 63%; 01/04/2022= 60%. 03/22/2022= 73%   Time 12    Period Weeks    Status Achieved   Target Date 06/26/2022     PT LONG TERM GOAL #2   Title Patient will increase Berg Balance score by > 6 points to demonstrate decreased fall risk during functional activities.    Baseline 4/20: 43/56 6/6: 48/56; 713/2023= 50/56   Time 12    Period Weeks    Status Achieved   Target Date 03/29/2022     PT LONG TERM GOAL #3   Title Patient will increase 10 meter walk test to >1.59ms as to improve gait speed for better community ambulation and to reduce fall risk.    Baseline 8/9: 0.65 m/s with rollator; 10/31: 0.68 m/s with rollator. 07/06/2021= 0.71 m/s using rollator; 08/22/2021= 0.94 m/s using rollator. 4/20: 1.1 m/s with rollator    Time 12    Period Weeks    Status  Achieved    Target Date 10/11/21      PT LONG TERM GOAL #4   Title Patient will increase six minute walk test distance to >1000 for progression to community ambulator and improve gait ability    Baseline 8/9: 505 ft with rollator; 03/21/21: 7733fc rollator, 04/24/21: 75532f rollator; 07/06/2021=900 feet; 08/22/2021= 935 feet with use of Rollator 44/20: 940 ft 11/28/21: 1055 feet with rollator   Time 12    Period Weeks    Status Achieved    Target Date 01/04/22      PT  LONG TERM GOAL #5   Title Patient will increase glute medius strength on Left LE from able to lift off 3" (sidelye on mat) to > 5" to improve stability, gait mechanics, and functional strength.    Baseline 10/31: 3-/5; 07/06/2021= 3-/5; 08/22/2021=3-/5 unable to achieve full ROM yet able to withstand some resistance with testing 4/20: unable to obtainfull ROM against gravity 6/6: unable to move against gravity; 01/04/2022= Continues to have difficulty - unable to raise left LE in sidelye against gravity but able to perform supine left Hip abd with some resistance= 3-/5; 03/22/2022- Patient able to raise his left LE 3 in off mat today. 04/03/2022= Patient able to raise left LE 3" off mat in sidelye today; 05/01/2022= 05/01/2022= patient able to raise left LE from sidelye position= 5"; 06/12/2022- Patient presents with 6.25 in sidelye Left hip abd against gravity. 06/26/2022= 7in in sidelye position.    Time 12    Period Weeks    Status MET   Target Date 06/26/2022     PT LONG TERM GOAL #6   Title Patient will increase Left single leg stance time to 15 seconds or greater to increase safety in shower and independence with ADLs.    Baseline 10/31; 1.83 sec without UE support; 08/22/2021=2-3  sec  on left; 6 sec on right 4/20: RLE 10 seconds LLE unable to perform 6/6: R LE 4 seconds during BERG; 01/04/2022= left = 3 sec and right = 8 sec; 02/13/2022= 20 sec on right at best and 4 sec on left LE at best. 03/23/2022=Patient able to stand on left LE at  best for 7 sec today. 04/03/2022= 27 sec on right and 12 sec on left; 06/12/2022= 13 sec on left  and 33 sec at best on right LE; 06/26/2021= 11 sec on left at best and 22 sec on right today   Time 12    Period Weeks    Status On-going    Target Date 09/18/2022     PT LONG TERM GOAL #7   Title Patient will safely negotiate 13 steps with handrails to safely enter/exit mothers house.    Baseline 4/20: very challenging 6/6: able to copmlete safely even managing dogs per pt report; 01/04/2022- Patient performed 16 steps with B Rail-no difficulty other than fatigue.    Time 12    Period Weeks    Status Achieved   Target Date 01/04/22    PT LONG TERM GOAL #8  Title Patient will increase six minute walk test distance to >1100 ft.  for progression to community ambulator and improve gait ability   Baseline 04/03/2022= 920 feet with 4WW - (Added another goal for endurance to continue to progress community amb distances); 05/03/2022= 960 feet with upright 4WW (patient's first time using device): 06/12/2022= 990 feet with 4WW; 06/26/2022= 1010 feet with 4WW  Time 12   Period Weeks   Status Progressing  Target Date 09/18/2022       PT LONG TERM GOAL #9  Title Patient will be able to perform functional activities such as vacuuming or using backpack sprayer with modified independence for improved household chore abilities   Baseline 06/26/2022= Patient unable to perform either task but has strong desire to return to performing if able.   Time 12   Period Weeks   Status MET  Target Date 06/26/2022   PT LONG TERM GOAL #10  Title Patient will increase glute medius strength on Left LE from able to lift off 7" (sidelye on mat)  to > 10" to improve Hip stability with standing, gait mechanics, and functional strength.   Baseline 06/26/2022= 7in in sidelye position.   Time 12   Period Weeks   Status  NEW  Target Date 09/18/2022     Plan - 12/13/21 1340     Clinical Impression Statement Continuing PT POC  with focus on gait, dynamic balance and LE strengthening. Pt remains with decreased L step lengths compared to RLE as evidenced by gait trainer along with pelvic drop indicative of L glut med weakness. Focus of session on improving lateral gluteal strength along with step lengths with good success. Pt able to successfully take equal step lengths with visual targets but continues to require SUE to BUE support for steadiness due to his LLE weakness. Pt will continue to benefit from skilled therapy to address remaining deficits in order to improve overall QoL and return to PLOF.     Personal Factors and Comorbidities Age;Comorbidity 3+;Fitness;Past/Current Experience;Time since onset of injury/illness/exacerbation    Comorbidities aroxysmal a fib, MS, lymphedema, hypothyroidism, Graves Disease, arthritis, lumbar herniated disc (L5), depression and neuropathy    Examination-Activity Limitations Bed Mobility;Bend;Caring for Others;Carry;Reach Overhead;Locomotion Level;Lift;Hygiene/Grooming;Dressing;Squat;Stairs;Stand;Toileting;Transfers    Examination-Participation Restrictions Cleaning;Community Activity;Laundry;Occupation;Shop;Volunteer;Yard Work    Merchant [employer] officer Evolving/Moderate complexity    Rehab Potential Fair    PT Frequency 2x / week    PT Duration 12 weeks    PT Treatment/Interventions ADLs/Self Care Home Management;Aquatic Therapy;Canalith Repostioning;Biofeedback;Cryotherapy;Ultrasound;Traction;Moist Heat;Electrical Stimulation;DME Instruction;Gait training;Therapeutic exercise;Therapeutic activities;Functional mobility training;Stair training;Balance training;Neuromuscular re-education;Patient/family education;Manual techniques;Orthotic Fit/Training;Compression bandaging;Passive range of motion;Vestibular;Taping;Splinting;Energy conservation;Dry needling;Visual/perceptual remediation/compensation    PT Next Visit Plan Continue with progessive balance training, Progressive LE  strenthening as appropriate.    PT Home Exercise Plan Access Code: [email or web address] URL: [email or web address]  Added Bridges with BTB and encouraged more walking around the home during commerical breaks when watching TV.    Consulted and Agree with Plan of Care Patient            Salem Caster. Fairly IV, PT, DPT Physical Therapist- [facility]  07/19/22, 4:11 PM

## 2022-07-24 ENCOUNTER — Ambulatory Visit: Payer: Medicare PPO

## 2022-07-24 DIAGNOSIS — R2681 Unsteadiness on feet: Secondary | ICD-10-CM

## 2022-07-24 DIAGNOSIS — R278 Other lack of coordination: Secondary | ICD-10-CM | POA: Diagnosis not present

## 2022-07-24 DIAGNOSIS — R262 Difficulty in walking, not elsewhere classified: Secondary | ICD-10-CM | POA: Diagnosis not present

## 2022-07-24 DIAGNOSIS — R269 Unspecified abnormalities of gait and mobility: Secondary | ICD-10-CM

## 2022-07-24 DIAGNOSIS — I890 Lymphedema, not elsewhere classified: Secondary | ICD-10-CM | POA: Diagnosis not present

## 2022-07-24 DIAGNOSIS — M6281 Muscle weakness (generalized): Secondary | ICD-10-CM | POA: Diagnosis not present

## 2022-07-24 DIAGNOSIS — R2689 Other abnormalities of gait and mobility: Secondary | ICD-10-CM

## 2022-07-24 NOTE — Therapy (Signed)
OUTPATIENT PHYSICAL THERAPY TREATMENT NOTE    Patient Name: Dustin Gray MRN: [id number] DOB:February 27, 1956, 67 y.o., male Today's Date: 07/24/2022  PCP: [name], MD REFERRING PROVIDER: [name], MD   PT End of Session - 07/24/22 1438     Visit Number 109    Number of Visits 126    Date for PT Re-Evaluation 09/18/22    Authorization Type [employer] Auth 11/28/21-02/22/22 for 24 visits    Authorization Time Period Cert for 1/85/63-14/9/70; Recert 26/37/8588- 5/0/2774; Recert 06/26/8784-7/67/2094    Progress Note Due on Visit 110    PT Start Time 1430    PT Stop Time 1515    PT Time Calculation (min) 45 min    Equipment Utilized During Treatment Gait belt    Activity Tolerance Patient tolerated treatment well;No increased pain;Patient limited by fatigue    Behavior During Therapy [facility] for tasks assessed/performed                     Past Medical History:  Diagnosis Date   Abscess    groin   Arthritis    lower spine   Erectile dysfunction    Low testosterone    Lumbar herniated disc    L5   Multiple sclerosis ([place])    Staph aureus infection    Past Surgical History:  Procedure Laterality Date   CARDIOVERSION N/A 11/22/2021   Procedure: CARDIOVERSION;  Surgeon: [name], MD;  Location: [facility];  Service: Cardiovascular;  Laterality: N/A;   COLONOSCOPY WITH PROPOFOL N/A 07/04/2016   Procedure: COLONOSCOPY WITH PROPOFOL;  Surgeon: [name], MD;  Location: [place];  Service: Endoscopy;  Laterality: N/A;   [place]   POLYPECTOMY  07/04/2016   Procedure: POLYPECTOMY;  Surgeon: [name], MD;  Location: [place];  Service: Endoscopy;;   TONSILLECTOMY     Patient Active Problem List   Diagnosis Date Noted   Persistent atrial fibrillation ([place])    Graves disease    Abdominal bloating    Atrial fibrillation with RVR ([place]) 10/01/2019   Hyperthyroidism    Atrial fibrillation  with rapid ventricular response ([place]) 09/30/2019   Leg edema    Left leg cellulitis    Special screening for malignant neoplasms, colon    Polyp of sigmoid colon    Benign neoplasm of ascending colon    Rectal polyp    Herniated nucleus pulposus, L5-S1 11/09/2015   Low back pain 10/25/2015   Herniated nucleus pulposus, C5-6 right 10/06/2015   Dysesthesia 09/16/2015   Spasticity 09/16/2015   Unsteady gait 09/16/2015   Multiple sclerosis ([place]) 06/08/2011     REFERRING DIAG: R26.81 (ICD-10-CM) - Unsteadiness on feet   THERAPY DIAG:  Unsteadiness on feet  Difficulty in walking, not elsewhere classified  Abnormality of gait and mobility  Muscle weakness (generalized)  Rationale for Evaluation and Treatment Rehabilitation  PERTINENT HISTORY: Patient presents to physical therapy for unsteadiness, walking instability, fatigue. His PMH includes paroxysmal a fib, MS, lymphedema, hypothyroidism, Graves Disease, arthritis, lumbar herniated disc (L5), depression and neuropathy. Patient first developed symptoms of MS in 2005 and was diagnosed in 2006. Patient has done PT in the past, but hasn't been seen since 2020 at [facility]. Has not been doing his exercises in the past year. Walk outside to garage to ride lawnmower, walks in house. Retired Automotive engineer. Has a rollator at home but doesn't use it in the house  PRECAUTIONS: Fall  SUBJECTIVE:  Pt reports feeling tired but okay today.   PAIN:  Are you having pain? No    TODAY'S TREATMENT:   THEREX:   Nustep - LE only x 5 min level 2-6- maintaining SPM at > 60   Step up + high knee with 5# AW x 20 reps each LE  Stepping around 4 cones positioned in // bars (circumducting) down and back x 3 with 5# AW Stepping over 4 cones positioned in // bars down and back x 3 with 5# AW Stepping diagonals- forward/backward  in/around cones positioned in bars x 3 with 5# AW  Donkey kick on right LE x 15 reps with 5# Attempted Donkey  kick on left yet unable so performed standing hip ext x 10 reps and then hamstring curl x 10 reps  Seated LAQ with 5# AW on right - hold 3 sec x 20 reps (VC for slow eccentric control)  Seated LAQ with no AW on left - hold 3 sec x 20 reps (VC for slow eccentric control)        PATIENT EDUCATION: Education details: exercise technique, body mechanics Person educated: Patient Education method: Customer service manager, verbal cues Education comprehension: verbalized understanding, returned demonstration    HOME EXERCISE PROGRAM: No changes this session   PT Short Term Goals -       PT SHORT TERM GOAL #1   Title Patient will be independent in home exercise program to improve strength/mobility for better functional independence with ADLs.    Baseline 8/9: HEP to be given next session, 10/31: patient reports compliance with HEP and would like progressions added next session. 08/22/2021=Patient verbalized that he is walking and doing his LE exercises with no questions at this time.    Time 4    Period Weeks    Status Achieved    Target Date 08/16/21              PT Long Term Goals       PT LONG TERM GOAL #1   Title Patient will increase FOTO score to equal to or greater than 70%  to demonstrate statistically significant improvement in mobility and quality of life.    Baseline 8/9: 64%; risk adjusted 34%; 03/21/21 FOTO: 45; 04/24/21 FOTO: 49% ; 08/22/2021= 56% 4/20: 60% 11/28/21: 63%; 01/04/2022= 60%. 03/22/2022= 73%   Time 12    Period Weeks    Status Achieved   Target Date 06/26/2022     PT LONG TERM GOAL #2   Title Patient will increase Berg Balance score by > 6 points to demonstrate decreased fall risk during functional activities.    Baseline 4/20: 43/56 6/6: 48/56; 713/2023= 50/56   Time 12    Period Weeks    Status Achieved   Target Date 03/29/2022     PT LONG TERM GOAL #3   Title Patient will increase 10 meter walk test to >1.35ms as to improve gait speed for  better community ambulation and to reduce fall risk.    Baseline 8/9: 0.65 m/s with rollator; 10/31: 0.68 m/s with rollator. 07/06/2021= 0.71 m/s using rollator; 08/22/2021= 0.94 m/s using rollator. 4/20: 1.1 m/s with rollator    Time 12    Period Weeks    Status Achieved    Target Date 10/11/21      PT LONG TERM GOAL #4   Title Patient will increase six minute walk test distance to >1000 for progression to community ambulator and improve gait ability  Baseline 8/9: 505 ft with rollator; 03/21/21: 726f c rollator, 04/24/21: 7581fc rollator; 07/06/2021=900 feet; 08/22/2021= 935 feet with use of Rollator 44/20: 940 ft 11/28/21: 1055 feet with rollator   Time 12    Period Weeks    Status Achieved    Target Date 01/04/22      PT LONG TERM GOAL #5   Title Patient will increase glute medius strength on Left LE from able to lift off 3" (sidelye on mat) to > 5" to improve stability, gait mechanics, and functional strength.    Baseline 10/31: 3-/5; 07/06/2021= 3-/5; 08/22/2021=3-/5 unable to achieve full ROM yet able to withstand some resistance with testing 4/20: unable to obtainfull ROM against gravity 6/6: unable to move against gravity; 01/04/2022= Continues to have difficulty - unable to raise left LE in sidelye against gravity but able to perform supine left Hip abd with some resistance= 3-/5; 03/22/2022- Patient able to raise his left LE 3 in off mat today. 04/03/2022= Patient able to raise left LE 3" off mat in sidelye today; 05/01/2022= 05/01/2022= patient able to raise left LE from sidelye position= 5"; 06/12/2022- Patient presents with 6.25 in sidelye Left hip abd against gravity. 06/26/2022= 7in in sidelye position.    Time 12    Period Weeks    Status MET   Target Date 06/26/2022     PT LONG TERM GOAL #6   Title Patient will increase Left single leg stance time to 15 seconds or greater to increase safety in shower and independence with ADLs.    Baseline 10/31; 1.83 sec without UE support;  08/22/2021=2-3  sec  on left; 6 sec on right 4/20: RLE 10 seconds LLE unable to perform 6/6: R LE 4 seconds during BERG; 01/04/2022= left = 3 sec and right = 8 sec; 02/13/2022= 20 sec on right at best and 4 sec on left LE at best. 03/23/2022=Patient able to stand on left LE at best for 7 sec today. 04/03/2022= 27 sec on right and 12 sec on left; 06/12/2022= 13 sec on left  and 33 sec at best on right LE; 06/26/2021= 11 sec on left at best and 22 sec on right today   Time 12    Period Weeks    Status On-going    Target Date 09/18/2022     PT LONG TERM GOAL #7   Title Patient will safely negotiate 13 steps with handrails to safely enter/exit mothers house.    Baseline 4/20: very challenging 6/6: able to copmlete safely even managing dogs per pt report; 01/04/2022- Patient performed 16 steps with B Rail-no difficulty other than fatigue.    Time 12    Period Weeks    Status Achieved   Target Date 01/04/22    PT LONG TERM GOAL #8  Title Patient will increase six minute walk test distance to >1100 ft.  for progression to community ambulator and improve gait ability   Baseline 04/03/2022= 920 feet with 4WW - (Added another goal for endurance to continue to progress community amb distances); 05/03/2022= 960 feet with upright 4WW (patient's first time using device): 06/12/2022= 990 feet with 4WW; 06/26/2022= 1010 feet with 4WW  Time 12   Period Weeks   Status Progressing  Target Date 09/18/2022       PT LONG TERM GOAL #9  Title Patient will be able to perform functional activities such as vacuuming or using backpack sprayer with modified independence for improved household chore abilities   Baseline 06/26/2022= Patient unable  to perform either task but has strong desire to return to performing if able.   Time 12   Period Weeks   Status MET  Target Date 06/26/2022   PT LONG TERM GOAL #10  Title Patient will increase glute medius strength on Left LE from able to lift off 7" (sidelye on mat) to > 10" to  improve Hip stability with standing, gait mechanics, and functional strength.   Baseline 06/26/2022= 7in in sidelye position.   Time 12   Period Weeks   Status  NEW  Target Date 09/18/2022     Plan - 12/13/21 1340     Clinical Impression Statement Continuing PT POC with focus today on LE strengthening- increased resistance with Nustep up to level 6 today and then utilized 5# AW with each LE for various standing exercises. Patient continues to exhibit some Left LE weakness and some hyperextension with weight bearing but overall performed well with fatigue as limiting factor and good ability to lift Left up/over and around cones. Pt will continue to benefit from skilled therapy to address remaining deficits in order to improve overall QoL and return to PLOF.     Personal Factors and Comorbidities Age;Comorbidity 3+;Fitness;Past/Current Experience;Time since onset of injury/illness/exacerbation    Comorbidities aroxysmal a fib, MS, lymphedema, hypothyroidism, Graves Disease, arthritis, lumbar herniated disc (L5), depression and neuropathy    Examination-Activity Limitations Bed Mobility;Bend;Caring for Others;Carry;Reach Overhead;Locomotion Level;Lift;Hygiene/Grooming;Dressing;Squat;Stairs;Stand;Toileting;Transfers    Examination-Participation Restrictions Cleaning;Community Activity;Laundry;Occupation;Shop;Volunteer;Yard Work    [employer]chant [employer] officer Evolving/Moderate complexity    Rehab Potential Fair    PT Frequency 2x / week    PT Duration 12 weeks    PT Treatment/Interventions ADLs/Self Care Home Management;Aquatic Therapy;Canalith Repostioning;Biofeedback;Cryotherapy;Ultrasound;Traction;Moist Heat;Electrical Stimulation;DME Instruction;Gait training;Therapeutic exercise;Therapeutic activities;Functional mobility training;Stair training;Balance training;Neuromuscular re-education;Patient/family education;Manual techniques;Orthotic Fit/Training;Compression bandaging;Passive range of  motion;Vestibular;Taping;Splinting;Energy conservation;Dry needling;Visual/perceptual remediation/compensation    PT Next Visit Plan Continue with progessive balance training, Progressive LE strenthening as appropriate.    PT Home Exercise Plan Access Code: [email or web address] URL: [email or web address]  Added [place]ges with BTB and encouraged more walking around the home during commerical breaks when watching TV.    Consulted and Agree with Plan of Care Patient            Ollen Bowl, PT Physical Therapist- [facility]  07/24/22, 3:32 PM

## 2022-07-25 DIAGNOSIS — E0500 Thyrotoxicosis with diffuse goiter without thyrotoxic crisis or storm: Secondary | ICD-10-CM | POA: Diagnosis not present

## 2022-07-26 ENCOUNTER — Ambulatory Visit: Payer: Medicare PPO | Attending: Neurology

## 2022-07-26 DIAGNOSIS — R2681 Unsteadiness on feet: Secondary | ICD-10-CM | POA: Insufficient documentation

## 2022-07-26 DIAGNOSIS — R2689 Other abnormalities of gait and mobility: Secondary | ICD-10-CM | POA: Diagnosis not present

## 2022-07-26 DIAGNOSIS — R262 Difficulty in walking, not elsewhere classified: Secondary | ICD-10-CM | POA: Insufficient documentation

## 2022-07-26 DIAGNOSIS — R278 Other lack of coordination: Secondary | ICD-10-CM | POA: Diagnosis not present

## 2022-07-26 DIAGNOSIS — M6281 Muscle weakness (generalized): Secondary | ICD-10-CM | POA: Insufficient documentation

## 2022-07-26 DIAGNOSIS — R269 Unspecified abnormalities of gait and mobility: Secondary | ICD-10-CM | POA: Diagnosis not present

## 2022-07-26 NOTE — Therapy (Signed)
OUTPATIENT PHYSICAL THERAPY TREATMENT NOTE/Physical Therapy Progress Note   Dates of reporting period  06/14/2022   to   07/26/2022    Patient Name: Dustin Gray MRN: [id number] DOB:June 16, 1956, 67 y.o., male Today's Date: 07/27/2022  PCP: [name], MD REFERRING PROVIDER: [name], MD   PT End of Session - 07/26/22 1456     Visit Number 110    Number of Visits 126    Date for PT Re-Evaluation 09/18/22    Authorization Type [employer] Auth 11/28/21-02/22/22 for 24 visits    Authorization Time Period Cert for 10/02/79-19/1/47; Recert 82/95/6213- 0/01/6577; Recert 09/29/9627-11/20/4130    Progress Note Due on Visit 120    PT Start Time 1431    PT Stop Time 1514    PT Time Calculation (min) 43 min    Equipment Utilized During Treatment Gait belt    Activity Tolerance Patient tolerated treatment well;No increased pain;Patient limited by fatigue    Behavior During Therapy [facility] for tasks assessed/performed                      Past Medical History:  Diagnosis Date   Abscess    groin   Arthritis    lower spine   Erectile dysfunction    Low testosterone    Lumbar herniated disc    L5   Multiple sclerosis ([place])    Staph aureus infection    Past Surgical History:  Procedure Laterality Date   CARDIOVERSION N/A 11/22/2021   Procedure: CARDIOVERSION;  Surgeon: [name], MD;  Location: [facility];  Service: Cardiovascular;  Laterality: N/A;   COLONOSCOPY WITH PROPOFOL N/A 07/04/2016   Procedure: COLONOSCOPY WITH PROPOFOL;  Surgeon: [name], MD;  Location: [place];  Service: Endoscopy;  Laterality: N/A;   [place]   POLYPECTOMY  07/04/2016   Procedure: POLYPECTOMY;  Surgeon: [name], MD;  Location: [place];  Service: Endoscopy;;   TONSILLECTOMY     Patient Active Problem List   Diagnosis Date Noted   Persistent atrial fibrillation ([place])    Graves disease    Abdominal bloating     Atrial fibrillation with RVR ([place]) 10/01/2019   Hyperthyroidism    Atrial fibrillation with rapid ventricular response ([place]) 09/30/2019   Leg edema    Left leg cellulitis    Special screening for malignant neoplasms, colon    Polyp of sigmoid colon    Benign neoplasm of ascending colon    Rectal polyp    Herniated nucleus pulposus, L5-S1 11/09/2015   Low back pain 10/25/2015   Herniated nucleus pulposus, C5-6 right 10/06/2015   Dysesthesia 09/16/2015   Spasticity 09/16/2015   Unsteady gait 09/16/2015   Multiple sclerosis ([place]) 06/08/2011     REFERRING DIAG: R26.81 (ICD-10-CM) - Unsteadiness on feet   THERAPY DIAG:  Unsteadiness on feet  Difficulty in walking, not elsewhere classified  Abnormality of gait and mobility  Muscle weakness (generalized)  Rationale for Evaluation and Treatment Rehabilitation  PERTINENT HISTORY: Patient presents to physical therapy for unsteadiness, walking instability, fatigue. His PMH includes paroxysmal a fib, MS, lymphedema, hypothyroidism, Graves Disease, arthritis, lumbar herniated disc (L5), depression and neuropathy. Patient first developed symptoms of MS in 2005 and was diagnosed in 2006. Patient has done PT in the past, but hasn't been seen since 2020 at [facility] Has not been doing his exercises in the past year. Walk outside to garage to ride lawnmower, walks in  house. 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  PAIN:  Are you having pain? No    TODAY'S TREATMENT:  Reassessment of goals- See goal section for today's details.     THEREX:    Sidelye Clamshell- Left LE 2 sets of 10 reps         PATIENT EDUCATION: Education details: exercise technique, body mechanics Person educated: Patient Education method: Holiday representative, verbal cues Education comprehension: verbalized understanding, returned demonstration    HOME EXERCISE PROGRAM: No changes this session   PT Short Term Goals -       PT SHORT TERM GOAL #1   Title Patient will be independent in home exercise program to improve strength/mobility for better functional independence with ADLs.    Baseline 8/9: HEP to be given next session, 10/31: patient reports compliance with HEP and would like progressions added next session. 08/22/2021=Patient verbalized that he is walking and doing his LE exercises with no questions at this time.    Time 4    Period Weeks    Status Achieved    Target Date 08/16/21              PT Long Term Goals       PT LONG TERM GOAL #1   Title Patient will increase FOTO score to equal to or greater than 70%  to demonstrate statistically significant improvement in mobility and quality of life.    Baseline 8/9: 64%; risk adjusted 34%; 03/21/21 FOTO: 45; 04/24/21 FOTO: 49% ; 08/22/2021= 56% 4/20: 60% 11/28/21: 63%; 01/04/2022= 60%. 03/22/2022= 73%   Time 12    Period Weeks    Status Achieved   Target Date 06/26/2022     PT LONG TERM GOAL #2   Title Patient will increase Berg Balance score by > 6 points to demonstrate decreased fall risk during functional activities.    Baseline 4/20: 43/56 6/6: 48/56; 713/2023= 50/56   Time 12    Period Weeks    Status Achieved   Target Date 03/29/2022     PT LONG TERM GOAL #3   Title Patient will increase 10 meter walk test to >1.28ms as to improve gait speed for better community ambulation and to reduce fall risk.    Baseline 8/9: 0.65 m/s with rollator; 10/31: 0.68 m/s with rollator. 07/06/2021= 0.71 m/s using rollator; 08/22/2021= 0.94 m/s using rollator. 4/20: 1.1 m/s with rollator    Time 12    Period Weeks    Status Achieved    Target Date 10/11/21      PT LONG TERM GOAL #4   Title Patient will increase six minute walk test distance to >1000 for progression to community  ambulator and improve gait ability    Baseline 8/9: 505 ft with rollator; 03/21/21: 7760fc rollator, 04/24/21: 75519f rollator; 07/06/2021=900 feet; 08/22/2021= 935 feet with use of Rollator 44/20: 940 ft 11/28/21: 1055 feet with rollator   Time 12    Period Weeks    Status Achieved    Target Date 01/04/22      PT LONG TERM GOAL #5   Title Patient will increase glute medius strength on Left LE from able to lift off 3" (sidelye on mat) to > 5" to improve stability, gait mechanics, and functional strength.  Baseline 10/31: 3-/5; 07/06/2021= 3-/5; 08/22/2021=3-/5 unable to achieve full ROM yet able to withstand some resistance with testing 4/20: unable to obtainfull ROM against gravity 6/6: unable to move against gravity; 01/04/2022= Continues to have difficulty - unable to raise left LE in sidelye against gravity but able to perform supine left Hip abd with some resistance= 3-/5; 03/22/2022- Patient able to raise his left LE 3 in off mat today. 04/03/2022= Patient able to raise left LE 3" off mat in sidelye today; 05/01/2022= 05/01/2022= patient able to raise left LE from sidelye position= 5"; 06/12/2022- Patient presents with 6.25 in sidelye Left hip abd against gravity. 06/26/2022= 7in in sidelye position.    Time 12    Period Weeks    Status MET   Target Date 06/26/2022     PT LONG TERM GOAL #6   Title Patient will increase Left single leg stance time to 15 seconds or greater to increase safety in shower and independence with ADLs.    Baseline 10/31; 1.83 sec without UE support; 08/22/2021=2-3  sec  on left; 6 sec on right 4/20: RLE 10 seconds LLE unable to perform 6/6: R LE 4 seconds during BERG; 01/04/2022= left = 3 sec and right = 8 sec; 02/13/2022= 20 sec on right at best and 4 sec on left LE at best. 03/23/2022=Patient able to stand on left LE at best for 7 sec today. 04/03/2022= 27 sec on right and 12 sec on left; 06/12/2022= 13 sec on left  and 33 sec at best on right LE; 06/26/2021= 11 sec on left at best  and 22 sec on right today; 07/26/2022= 10 sec on left and 20 sec on right today.    Time 12    Period Weeks    Status On-going    Target Date 09/18/2022     PT LONG TERM GOAL #7   Title Patient will safely negotiate 13 steps with handrails to safely enter/exit mothers house.    Baseline 4/20: very challenging 6/6: able to copmlete safely even managing dogs per pt report; 01/04/2022- Patient performed 16 steps with B Rail-no difficulty other than fatigue.    Time 12    Period Weeks    Status Achieved   Target Date 01/04/22    PT LONG TERM GOAL #8  Title Patient will increase six minute walk test distance to >1100 ft.  for progression to community ambulator and improve gait ability   Baseline 04/03/2022= 920 feet with 4WW - (Added another goal for endurance to continue to progress community amb distances); 05/03/2022= 960 feet with upright 4WW (patient's first time using device): 06/12/2022= 990 feet with 4WW; 06/26/2022= 1010 feet with 4WW; 07/26/2022= 1015 feet  Time 12   Period Weeks   Status Progressing  Target Date 09/18/2022       PT LONG TERM GOAL #9  Title Patient will be able to perform functional activities such as vacuuming or using backpack sprayer with modified independence for improved household chore abilities   Baseline 06/26/2022= Patient unable to perform either task but has strong desire to return to performing if able. 07/26/2022- Patient has not attempted to vacuum yet and PT session focusing on LE strengthening.   Time 12   Period Weeks   Status Ongoing  Target Date 09/18/2022   PT LONG TERM GOAL #10  Title Patient will increase glute medius strength on Left LE from able to lift off 7" (sidelye on mat) to > 10" to improve Hip stability with standing,  gait mechanics, and functional strength.   Baseline 06/26/2022= 7in in sidelye position. 07/26/2022- 7.5 in  Time 12   Period Weeks   Status ONGOING  Target Date 09/18/2022     Plan - 12/13/21 1340     Clinical Impression  Statement Treatment consisted of LE strengthening and reassessment of goals for progress note visit. He continues to make some progress- able to slightly improve his overall walking distance in 6 min using walker as well as lift his left LE in sidelye more demonstrating increased Left LE strength. He continues to remain motivated and working hard in sessions to promote his strength, endurance and balance. Patient's condition has the potential to improve in response to therapy. Maximum improvement is yet to be obtained. The anticipated improvement is attainable and reasonable in a generally predictable time. Pt will continue to benefit from skilled therapy to address remaining deficits in order to improve overall QoL and return to PLOF.     Personal Factors and Comorbidities Age;Comorbidity 3+;Fitness;Past/Current Experience;Time since onset of injury/illness/exacerbation    Comorbidities aroxysmal a fib, MS, lymphedema, hypothyroidism, Graves Disease, arthritis, lumbar herniated disc (L5), depression and neuropathy    Examination-Activity Limitations Bed Mobility;Bend;Caring for Others;Carry;Reach Overhead;Locomotion Level;Lift;Hygiene/Grooming;Dressing;Squat;Stairs;Stand;Toileting;Transfers    Examination-Participation Restrictions Cleaning;Community Activity;Laundry;Occupation;Shop;Volunteer;Yard Work    Merchant navy officer Evolving/Moderate complexity    Rehab Potential Fair    PT Frequency 2x / week    PT Duration 12 weeks    PT Treatment/Interventions ADLs/Self Care Home Management;Aquatic Therapy;Canalith Repostioning;Biofeedback;Cryotherapy;Ultrasound;Traction;Moist Heat;Electrical Stimulation;DME Instruction;Gait training;Therapeutic exercise;Therapeutic activities;Functional mobility training;Stair training;Balance training;Neuromuscular re-education;Patient/family education;Manual techniques;Orthotic Fit/Training;Compression bandaging;Passive range of  motion;Vestibular;Taping;Splinting;Energy conservation;Dry needling;Visual/perceptual remediation/compensation    PT Next Visit Plan Continue with progessive balance training, Progressive LE strenthening as appropriate.    PT Home Exercise Plan Access Code: [email or web address] URL: [email or web address]  Added Bridges with BTB and encouraged more walking around the home during commerical breaks when watching TV.    Consulted and Agree with Plan of Care Patient            Ollen Bowl, PT Physical Therapist- [facility]  07/27/22, 9:51 AM

## 2022-07-31 ENCOUNTER — Ambulatory Visit: Payer: Medicare PPO

## 2022-07-31 DIAGNOSIS — R278 Other lack of coordination: Secondary | ICD-10-CM

## 2022-07-31 DIAGNOSIS — R2689 Other abnormalities of gait and mobility: Secondary | ICD-10-CM | POA: Diagnosis not present

## 2022-07-31 DIAGNOSIS — R262 Difficulty in walking, not elsewhere classified: Secondary | ICD-10-CM | POA: Diagnosis not present

## 2022-07-31 DIAGNOSIS — R269 Unspecified abnormalities of gait and mobility: Secondary | ICD-10-CM | POA: Diagnosis not present

## 2022-07-31 DIAGNOSIS — R2681 Unsteadiness on feet: Secondary | ICD-10-CM

## 2022-07-31 DIAGNOSIS — M6281 Muscle weakness (generalized): Secondary | ICD-10-CM

## 2022-07-31 DIAGNOSIS — E0500 Thyrotoxicosis with diffuse goiter without thyrotoxic crisis or storm: Secondary | ICD-10-CM | POA: Diagnosis not present

## 2022-07-31 NOTE — Therapy (Signed)
OUTPATIENT PHYSICAL THERAPY TREATMENT NOTE   Patient Name: Dustin Gray MRN: [id number] DOB:January 18, 1956, 67 y.o., male Today's Date: 07/31/2022  PCP: [name], MD REFERRING PROVIDER: [name], MD   PT End of Session - 07/31/22 1513     Visit Number 111    Number of Visits 126    Date for PT Re-Evaluation 09/18/22    Authorization Time Period 06/26/2022-09/18/2022    Progress Note Due on Visit 120    PT Start Time 1435    PT Stop Time 42   Pt asks to leave early for appointment elsewhere   PT Time Calculation (min) 30 min    Activity Tolerance Patient tolerated treatment well;No increased pain;Patient limited by fatigue    Behavior During Therapy [facility] for tasks assessed/performed                      Past Medical History:  Diagnosis Date   Abscess    groin   Arthritis    lower spine   Erectile dysfunction    Low testosterone    Lumbar herniated disc    L5   Multiple sclerosis ([place])    Staph aureus infection    Past Surgical History:  Procedure Laterality Date   CARDIOVERSION N/A 11/22/2021   Procedure: CARDIOVERSION;  Surgeon: [name], MD;  Location: [facility];  Service: Cardiovascular;  Laterality: N/A;   COLONOSCOPY WITH PROPOFOL N/A 07/04/2016   Procedure: COLONOSCOPY WITH PROPOFOL;  Surgeon: [name], MD;  Location: [place];  Service: Endoscopy;  Laterality: N/A;   [place]   POLYPECTOMY  07/04/2016   Procedure: POLYPECTOMY;  Surgeon: [name], MD;  Location: [place];  Service: Endoscopy;;   TONSILLECTOMY     Patient Active Problem List   Diagnosis Date Noted   Persistent atrial fibrillation ([place])    Graves disease    Abdominal bloating    Atrial fibrillation with RVR ([place]) 10/01/2019   Hyperthyroidism    Atrial fibrillation with rapid ventricular response ([place]) 09/30/2019   Leg edema    Left leg cellulitis    Special screening for malignant  neoplasms, colon    Polyp of sigmoid colon    Benign neoplasm of ascending colon    Rectal polyp    Herniated nucleus pulposus, L5-S1 11/09/2015   Low back pain 10/25/2015   Herniated nucleus pulposus, C5-6 right 10/06/2015   Dysesthesia 09/16/2015   Spasticity 09/16/2015   Unsteady gait 09/16/2015   Multiple sclerosis ([place]) 06/08/2011     REFERRING DIAG: R26.81 (ICD-10-CM) - Unsteadiness on feet   THERAPY DIAG:  Unsteadiness on feet  Difficulty in walking, not elsewhere classified  Abnormality of gait and mobility  Muscle weakness (generalized)  Rationale for Evaluation and Treatment Rehabilitation  PERTINENT HISTORY: Patient presents to physical therapy for unsteadiness, walking instability, fatigue. His PMH includes paroxysmal a fib, MS, lymphedema, hypothyroidism, Graves Disease, arthritis, lumbar herniated disc (L5), depression and neuropathy. Patient first developed symptoms of MS in 2005 and was diagnosed in 2006. Patient has done PT in the past, but hasn't been seen since 2020 at [facility]. Has not been doing his exercises in the past year. Walk outside to garage to ride lawnmower, walks in house. Retired Automotive engineer. Has a rollator at home but doesn't use it in the house   PRECAUTIONS: Fall  SUBJECTIVE:  No updates today. He reports to be doing generally well. He remains motivated  to advance his strength and mobility.   PAIN:  Are you having pain? No    TODAY'S TREATMENT: -Nustep - LE only x 5 min level 2-6- maintaining SPM at > 60 -STS from elevated surface hands-free 1x10  -standing marching in place 1x20-, 4lb AW, BUE support  -STS from elevated surface hands-free 1x10 c 10lb ball and toss/catch while standing -standing marching in place 1x16x2secH-, 4lb AW, BUE support     PATIENT EDUCATION: Education details: exercise technique, body mechanics Person educated: Patient Education method: Customer service manager, verbal cues Education  comprehension: verbalized understanding, returned demonstration    HOME EXERCISE PROGRAM: No changes this session   PT Short Term Goals -       PT SHORT TERM GOAL #1   Title Patient will be independent in home exercise program to improve strength/mobility for better functional independence with ADLs.    Baseline 8/9: HEP to be given next session, 10/31: patient reports compliance with HEP and would like progressions added next session. 08/22/2021=Patient verbalized that he is walking and doing his LE exercises with no questions at this time.    Time 4    Period Weeks    Status Achieved    Target Date 08/16/21              PT Long Term Goals       PT LONG TERM GOAL #1   Title Patient will increase FOTO score to equal to or greater than 70%  to demonstrate statistically significant improvement in mobility and quality of life.    Baseline 8/9: 64%; risk adjusted 34%; 03/21/21 FOTO: 45; 04/24/21 FOTO: 49% ; 08/22/2021= 56% 4/20: 60% 11/28/21: 63%; 01/04/2022= 60%. 03/22/2022= 73%   Time 12    Period Weeks    Status Achieved   Target Date 06/26/2022     PT LONG TERM GOAL #2   Title Patient will increase Berg Balance score by > 6 points to demonstrate decreased fall risk during functional activities.    Baseline 4/20: 43/56 6/6: 48/56; 713/2023= 50/56   Time 12    Period Weeks    Status Achieved   Target Date 03/29/2022     PT LONG TERM GOAL #3   Title Patient will increase 10 meter walk test to >1.52ms as to improve gait speed for better community ambulation and to reduce fall risk.    Baseline 8/9: 0.65 m/s with rollator; 10/31: 0.68 m/s with rollator. 07/06/2021= 0.71 m/s using rollator; 08/22/2021= 0.94 m/s using rollator. 4/20: 1.1 m/s with rollator    Time 12    Period Weeks    Status Achieved    Target Date 10/11/21      PT LONG TERM GOAL #4   Title Patient will increase six minute walk test distance to >1000 for progression to community ambulator and improve gait ability     Baseline 8/9: 505 ft with rollator; 03/21/21: 7744fc rollator, 04/24/21: 75513f rollator; 07/06/2021=900 feet; 08/22/2021= 935 feet with use of Rollator 44/20: 940 ft 11/28/21: 1055 feet with rollator   Time 12    Period Weeks    Status Achieved    Target Date 01/04/22      PT LONG TERM GOAL #5   Title Patient will increase glute medius strength on Left LE from able to lift off 3" (sidelye on mat) to > 5" to improve stability, gait mechanics, and functional strength.    Baseline 10/31: 3-/5; 07/06/2021= 3-/5; 08/22/2021=3-/5 unable to achieve full ROM yet  able to withstand some resistance with testing 4/20: unable to obtainfull ROM against gravity 6/6: unable to move against gravity; 01/04/2022= Continues to have difficulty - unable to raise left LE in sidelye against gravity but able to perform supine left Hip abd with some resistance= 3-/5; 03/22/2022- Patient able to raise his left LE 3 in off mat today. 04/03/2022= Patient able to raise left LE 3" off mat in sidelye today; 05/01/2022= 05/01/2022= patient able to raise left LE from sidelye position= 5"; 06/12/2022- Patient presents with 6.25 in sidelye Left hip abd against gravity. 06/26/2022= 7in in sidelye position.    Time 12    Period Weeks    Status MET   Target Date 06/26/2022     PT LONG TERM GOAL #6   Title Patient will increase Left single leg stance time to 15 seconds or greater to increase safety in shower and independence with ADLs.    Baseline 10/31; 1.83 sec without UE support; 08/22/2021=2-3  sec  on left; 6 sec on right 4/20: RLE 10 seconds LLE unable to perform 6/6: R LE 4 seconds during BERG; 01/04/2022= left = 3 sec and right = 8 sec; 02/13/2022= 20 sec on right at best and 4 sec on left LE at best. 03/23/2022=Patient able to stand on left LE at best for 7 sec today. 04/03/2022= 27 sec on right and 12 sec on left; 06/12/2022= 13 sec on left  and 33 sec at best on right LE; 06/26/2021= 11 sec on left at best and 22 sec on right today; 07/26/2022=  10 sec on left and 20 sec on right today.    Time 12    Period Weeks    Status On-going    Target Date 09/18/2022     PT LONG TERM GOAL #7   Title Patient will safely negotiate 13 steps with handrails to safely enter/exit mothers house.    Baseline 4/20: very challenging 6/6: able to copmlete safely even managing dogs per pt report; 01/04/2022- Patient performed 16 steps with B Rail-no difficulty other than fatigue.    Time 12    Period Weeks    Status Achieved   Target Date 01/04/22    PT LONG TERM GOAL #8  Title Patient will increase six minute walk test distance to >1100 ft.  for progression to community ambulator and improve gait ability   Baseline 04/03/2022= 920 feet with 4WW - (Added another goal for endurance to continue to progress community amb distances); 05/03/2022= 960 feet with upright 4WW (patient's first time using device): 06/12/2022= 990 feet with 4WW; 06/26/2022= 1010 feet with 4WW; 07/26/2022= 1015 feet  Time 12   Period Weeks   Status Progressing  Target Date 09/18/2022       PT LONG TERM GOAL #9  Title Patient will be able to perform functional activities such as vacuuming or using backpack sprayer with modified independence for improved household chore abilities   Baseline 06/26/2022= Patient unable to perform either task but has strong desire to return to performing if able. 07/26/2022- Patient has not attempted to vacuum yet and PT session focusing on LE strengthening.   Time 12   Period Weeks   Status Ongoing  Target Date 09/18/2022   PT LONG TERM GOAL #10  Title Patient will increase glute medius strength on Left LE from able to lift off 7" (sidelye on mat) to > 10" to improve Hip stability with standing, gait mechanics, and functional strength.   Baseline 06/26/2022= 7in in sidelye  position. 07/26/2022- 7.5 in  Time 12   Period Weeks   Status ONGOING  Target Date 09/18/2022     Plan - 12/13/21 1340     Clinical Impression Statement Continued previously  successful intervention with limited but predictable activity intolerance. No LOB this session. Pt leaves early due to scheduling constraints on his part. Will continue to follow.    Personal Factors and Comorbidities Age;Comorbidity 3+;Fitness;Past/Current Experience;Time since onset of injury/illness/exacerbation    Comorbidities aroxysmal a fib, MS, lymphedema, hypothyroidism, Graves Disease, arthritis, lumbar herniated disc (L5), depression and neuropathy    Examination-Activity Limitations Bed Mobility;Bend;Caring for Others;Carry;Reach Overhead;Locomotion Level;Lift;Hygiene/Grooming;Dressing;Squat;Stairs;Stand;Toileting;Transfers    Examination-Participation Restrictions Cleaning;Community Activity;Laundry;Occupation;Shop;Volunteer;Yard Work    Merchant navy officer Evolving/Moderate complexity    Rehab Potential Fair    PT Frequency 2x / week    PT Duration 12 weeks    PT Treatment/Interventions ADLs/Self Care Home Management;Aquatic Therapy;Canalith Repostioning;Biofeedback;Cryotherapy;Ultrasound;Traction;Moist Heat;Electrical Stimulation;DME Instruction;Gait training;Therapeutic exercise;Therapeutic activities;Functional mobility training;Stair training;Balance training;Neuromuscular re-education;Patient/family education;Manual techniques;Orthotic Fit/Training;Compression bandaging;Passive range of motion;Vestibular;Taping;Splinting;Energy conservation;Dry needling;Visual/perceptual remediation/compensation    PT Next Visit Plan Continue with progessive balance training, Progressive LE strenthening as appropriate.    PT Home Exercise Plan Access Code: [email or web address] URL: [email or web address]  Added Bridges with BTB and encouraged more walking around the home during commerical breaks when watching TV.    Consulted and Agree with Plan of Care Patient            3:15 PM, 07/31/22 Etta Grandchild, PT, DPT Physical Therapist - [place] [phone]     07/31/22, 3:14 PM

## 2022-08-02 ENCOUNTER — Ambulatory Visit: Payer: Medicare PPO

## 2022-08-02 DIAGNOSIS — R2681 Unsteadiness on feet: Secondary | ICD-10-CM

## 2022-08-02 DIAGNOSIS — R269 Unspecified abnormalities of gait and mobility: Secondary | ICD-10-CM

## 2022-08-02 DIAGNOSIS — R2689 Other abnormalities of gait and mobility: Secondary | ICD-10-CM | POA: Diagnosis not present

## 2022-08-02 DIAGNOSIS — M6281 Muscle weakness (generalized): Secondary | ICD-10-CM

## 2022-08-02 DIAGNOSIS — R278 Other lack of coordination: Secondary | ICD-10-CM

## 2022-08-02 DIAGNOSIS — R262 Difficulty in walking, not elsewhere classified: Secondary | ICD-10-CM

## 2022-08-02 NOTE — Therapy (Signed)
OUTPATIENT PHYSICAL THERAPY TREATMENT NOTE   Patient Name: Dustin Gray MRN: [id number] DOB:1956/02/16, 67 y.o., male Today's Date: 08/02/2022  PCP: [name], MD REFERRING PROVIDER: [name], MD   PT End of Session - 08/02/22 1445     Visit Number 112    Number of Visits 126    Date for PT Re-Evaluation 09/18/22    Authorization Time Period 06/26/2022-09/18/2022    Progress Note Due on Visit 120    PT Start Time 1440    PT Stop Time 1515    PT Time Calculation (min) 35 min    Activity Tolerance Patient tolerated treatment well;No increased pain;Patient limited by fatigue    Behavior During Therapy [facility] for tasks assessed/performed                      Past Medical History:  Diagnosis Date   Abscess    groin   Arthritis    lower spine   Erectile dysfunction    Low testosterone    Lumbar herniated disc    L5   Multiple sclerosis ([place])    Staph aureus infection    Past Surgical History:  Procedure Laterality Date   CARDIOVERSION N/A 11/22/2021   Procedure: CARDIOVERSION;  Surgeon: [name], MD;  Location: [facility];  Service: Cardiovascular;  Laterality: N/A;   COLONOSCOPY WITH PROPOFOL N/A 07/04/2016   Procedure: COLONOSCOPY WITH PROPOFOL;  Surgeon: [name], MD;  Location: [place];  Service: Endoscopy;  Laterality: N/A;   [place]   POLYPECTOMY  07/04/2016   Procedure: POLYPECTOMY;  Surgeon: [name], MD;  Location: [place];  Service: Endoscopy;;   TONSILLECTOMY     Patient Active Problem List   Diagnosis Date Noted   Persistent atrial fibrillation ([place])    Graves disease    Abdominal bloating    Atrial fibrillation with RVR ([place]) 10/01/2019   Hyperthyroidism    Atrial fibrillation with rapid ventricular response ([place]) 09/30/2019   Leg edema    Left leg cellulitis    Special screening for malignant neoplasms, colon    Polyp of sigmoid colon    Benign  neoplasm of ascending colon    Rectal polyp    Herniated nucleus pulposus, L5-S1 11/09/2015   Low back pain 10/25/2015   Herniated nucleus pulposus, C5-6 right 10/06/2015   Dysesthesia 09/16/2015   Spasticity 09/16/2015   Unsteady gait 09/16/2015   Multiple sclerosis ([place]) 06/08/2011     REFERRING DIAG: R26.81 (ICD-10-CM) - Unsteadiness on feet   THERAPY DIAG:  Unsteadiness on feet  Difficulty in walking, not elsewhere classified  Abnormality of gait and mobility  Muscle weakness (generalized)  Rationale for Evaluation and Treatment Rehabilitation  PERTINENT HISTORY: Patient presents to physical therapy for unsteadiness, walking instability, fatigue. His PMH includes paroxysmal a fib, MS, lymphedema, hypothyroidism, Graves Disease, arthritis, lumbar herniated disc (L5), depression and neuropathy. Patient first developed symptoms of MS in 2005 and was diagnosed in 2006. Patient has done PT in the past, but hasn't been seen since 2020 at [facility]. Has not been doing his exercises in the past year. Walk outside to garage to ride lawnmower, walks in house. 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 He does report some functional progress- seen by improved ability to  walk with rollator on unlevel surfaces keeping up with small dog in yard.   PAIN:  Are you having pain? No    TODAY'S TREATMENT: -STS with 1 foot on 2 in step x 10 reps each LE  -standing 3 way SLR- Forward/hip abd/Ext x 10 reps each, 4lb AW, BUE support   - Standing hip march combo with hip ER with 4# AW x 15 reps each LE.   - Resistive gait with 4# AW- 450 feet today using rollator focusing on mm endurance. Patient performed well overall - reciprocal steps and no excessive fatigue today. Patient reported some Left LE weakness yet able to clear feet and take  reciprocal steps throughout.      PATIENT EDUCATION: Education details: exercise technique, body mechanics Person educated: Patient Education method: Customer service manager, verbal cues Education comprehension: verbalized understanding, returned demonstration    HOME EXERCISE PROGRAM: No changes this session   PT Short Term Goals -       PT SHORT TERM GOAL #1   Title Patient will be independent in home exercise program to improve strength/mobility for better functional independence with ADLs.    Baseline 8/9: HEP to be given next session, 10/31: patient reports compliance with HEP and would like progressions added next session. 08/22/2021=Patient verbalized that he is walking and doing his LE exercises with no questions at this time.    Time 4    Period Weeks    Status Achieved    Target Date 08/16/21              PT Long Term Goals       PT LONG TERM GOAL #1   Title Patient will increase FOTO score to equal to or greater than 70%  to demonstrate statistically significant improvement in mobility and quality of life.    Baseline 8/9: 64%; risk adjusted 34%; 03/21/21 FOTO: 45; 04/24/21 FOTO: 49% ; 08/22/2021= 56% 4/20: 60% 11/28/21: 63%; 01/04/2022= 60%. 03/22/2022= 73%   Time 12    Period Weeks    Status Achieved   Target Date 06/26/2022     PT LONG TERM GOAL #2   Title Patient will increase Berg Balance score by > 6 points to demonstrate decreased fall risk during functional activities.    Baseline 4/20: 43/56 6/6: 48/56; 713/2023= 50/56   Time 12    Period Weeks    Status Achieved   Target Date 03/29/2022     PT LONG TERM GOAL #3   Title Patient will increase 10 meter walk test to >1.63ms as to improve gait speed for better community ambulation and to reduce fall risk.    Baseline 8/9: 0.65 m/s with rollator; 10/31: 0.68 m/s with rollator. 07/06/2021= 0.71 m/s using rollator; 08/22/2021= 0.94 m/s using rollator. 4/20: 1.1 m/s with rollator    Time 12    Period  Weeks    Status Achieved    Target Date 10/11/21      PT LONG TERM GOAL #4   Title Patient will increase six minute walk test distance to >1000 for progression to community ambulator and improve gait ability    Baseline 8/9: 505 ft with rollator; 03/21/21: 7754fc rollator, 04/24/21: 7551f rollator; 07/06/2021=900 feet; 08/22/2021= 935 feet with use of Rollator 44/20: 940 ft 11/28/21: 1055 feet with rollator   Time 12    Period Weeks    Status Achieved    Target Date 01/04/22      PT LONG TERM GOAL #5  Title Patient will increase glute medius strength on Left LE from able to lift off 3" (sidelye on mat) to > 5" to improve stability, gait mechanics, and functional strength.    Baseline 10/31: 3-/5; 07/06/2021= 3-/5; 08/22/2021=3-/5 unable to achieve full ROM yet able to withstand some resistance with testing 4/20: unable to obtainfull ROM against gravity 6/6: unable to move against gravity; 01/04/2022= Continues to have difficulty - unable to raise left LE in sidelye against gravity but able to perform supine left Hip abd with some resistance= 3-/5; 03/22/2022- Patient able to raise his left LE 3 in off mat today. 04/03/2022= Patient able to raise left LE 3" off mat in sidelye today; 05/01/2022= 05/01/2022= patient able to raise left LE from sidelye position= 5"; 06/12/2022- Patient presents with 6.25 in sidelye Left hip abd against gravity. 06/26/2022= 7in in sidelye position.    Time 12    Period Weeks    Status MET   Target Date 06/26/2022     PT LONG TERM GOAL #6   Title Patient will increase Left single leg stance time to 15 seconds or greater to increase safety in shower and independence with ADLs.    Baseline 10/31; 1.83 sec without UE support; 08/22/2021=2-3  sec  on left; 6 sec on right 4/20: RLE 10 seconds LLE unable to perform 6/6: R LE 4 seconds during BERG; 01/04/2022= left = 3 sec and right = 8 sec; 02/13/2022= 20 sec on right at best and 4 sec on left LE at best. 03/23/2022=Patient able to stand  on left LE at best for 7 sec today. 04/03/2022= 27 sec on right and 12 sec on left; 06/12/2022= 13 sec on left  and 33 sec at best on right LE; 06/26/2021= 11 sec on left at best and 22 sec on right today; 07/26/2022= 10 sec on left and 20 sec on right today.    Time 12    Period Weeks    Status On-going    Target Date 09/18/2022     PT LONG TERM GOAL #7   Title Patient will safely negotiate 13 steps with handrails to safely enter/exit mothers house.    Baseline 4/20: very challenging 6/6: able to copmlete safely even managing dogs per pt report; 01/04/2022- Patient performed 16 steps with B Rail-no difficulty other than fatigue.    Time 12    Period Weeks    Status Achieved   Target Date 01/04/22    PT LONG TERM GOAL #8  Title Patient will increase six minute walk test distance to >1100 ft.  for progression to community ambulator and improve gait ability   Baseline 04/03/2022= 920 feet with 4WW - (Added another goal for endurance to continue to progress community amb distances); 05/03/2022= 960 feet with upright 4WW (patient's first time using device): 06/12/2022= 990 feet with 4WW; 06/26/2022= 1010 feet with 4WW; 07/26/2022= 1015 feet  Time 12   Period Weeks   Status Progressing  Target Date 09/18/2022       PT LONG TERM GOAL #9  Title Patient will be able to perform functional activities such as vacuuming or using backpack sprayer with modified independence for improved household chore abilities   Baseline 06/26/2022= Patient unable to perform either task but has strong desire to return to performing if able. 07/26/2022- Patient has not attempted to vacuum yet and PT session focusing on LE strengthening.   Time 12   Period Weeks   Status Ongoing  Target Date 09/18/2022   PT  LONG TERM GOAL #10  Title Patient will increase glute medius strength on Left LE from able to lift off 7" (sidelye on mat) to > 10" to improve Hip stability with standing, gait mechanics, and functional strength.   Baseline  06/26/2022= 7in in sidelye position. 07/26/2022- 7.5 in  Time 12   Period Weeks   Status ONGOING  Target Date 09/18/2022     Plan - 12/13/21 1340     Clinical Impression Statement Continued per plan of care focusing on LE strengthening. Patient presents with excellent motivation and actually able to move Left LE well with resistance today.  Patient will continue to benefit from skilled PT services to continue to improve overall LE strength and functional mobility for optimal independent function.    Personal Factors and Comorbidities Age;Comorbidity 3+;Fitness;Past/Current Experience;Time since onset of injury/illness/exacerbation    Comorbidities aroxysmal a fib, MS, lymphedema, hypothyroidism, Graves Disease, arthritis, lumbar herniated disc (L5), depression and neuropathy    Examination-Activity Limitations Bed Mobility;Bend;Caring for Others;Carry;Reach Overhead;Locomotion Level;Lift;Hygiene/Grooming;Dressing;Squat;Stairs;Stand;Toileting;Transfers    Examination-Participation Restrictions Cleaning;Community Activity;Laundry;Occupation;Shop;Volunteer;Yard Work    Merchant navy officer Evolving/Moderate complexity    Rehab Potential Fair    PT Frequency 2x / week    PT Duration 12 weeks    PT Treatment/Interventions ADLs/Self Care Home Management;Aquatic Therapy;Canalith Repostioning;Biofeedback;Cryotherapy;Ultrasound;Traction;Moist Heat;Electrical Stimulation;DME Instruction;Gait training;Therapeutic exercise;Therapeutic activities;Functional mobility training;Stair training;Balance training;Neuromuscular re-education;Patient/family education;Manual techniques;Orthotic Fit/Training;Compression bandaging;Passive range of motion;Vestibular;Taping;Splinting;Energy conservation;Dry needling;Visual/perceptual remediation/compensation    PT Next Visit Plan Continue with progessive balance training, Progressive LE strenthening as appropriate.    PT Home Exercise Plan Access Code: [email or web address]  URL: [email or web address]  Added Bridges with BTB and encouraged more walking around the home during commerical breaks when watching TV.    Consulted and Agree with Plan of Care Patient            4:00 PM, 08/02/22 Ollen Bowl, PT Physical Therapist - [place] [phone]     08/02/22, 4:00 PM

## 2022-08-07 ENCOUNTER — Ambulatory Visit: Payer: Medicare PPO

## 2022-08-07 DIAGNOSIS — R262 Difficulty in walking, not elsewhere classified: Secondary | ICD-10-CM | POA: Diagnosis not present

## 2022-08-07 DIAGNOSIS — R2681 Unsteadiness on feet: Secondary | ICD-10-CM

## 2022-08-07 DIAGNOSIS — R269 Unspecified abnormalities of gait and mobility: Secondary | ICD-10-CM

## 2022-08-07 DIAGNOSIS — M6281 Muscle weakness (generalized): Secondary | ICD-10-CM

## 2022-08-07 DIAGNOSIS — R278 Other lack of coordination: Secondary | ICD-10-CM | POA: Diagnosis not present

## 2022-08-07 DIAGNOSIS — R2689 Other abnormalities of gait and mobility: Secondary | ICD-10-CM

## 2022-08-07 NOTE — Therapy (Signed)
OUTPATIENT PHYSICAL THERAPY TREATMENT NOTE   Patient Name: Dustin Gray MRN: [id number] DOB:10-08-1955, 67 y.o., male Today's Date: 08/08/2022  PCP: [name], MD REFERRING PROVIDER: [name], MD   PT End of Session - 08/07/22 1439     Visit Number 113    Number of Visits 126    Date for PT Re-Evaluation 09/18/22    Authorization Time Period 06/26/2022-09/18/2022    Progress Note Due on Visit 120    PT Start Time 1434    PT Stop Time 1514    PT Time Calculation (min) 40 min    Activity Tolerance Patient tolerated treatment well    Behavior During Therapy WFL for tasks assessed/performed                      Past Medical History:  Diagnosis Date   Abscess    groin   Arthritis    lower spine   Erectile dysfunction    Low testosterone    Lumbar herniated disc    L5   Multiple sclerosis ([place])    Staph aureus infection    Past Surgical History:  Procedure Laterality Date   CARDIOVERSION N/A 11/22/2021   Procedure: CARDIOVERSION;  Surgeon: [name], MD;  Location: [facility];  Service: Cardiovascular;  Laterality: N/A;   COLONOSCOPY WITH PROPOFOL N/A 07/04/2016   Procedure: COLONOSCOPY WITH PROPOFOL;  Surgeon: [name], MD;  Location: [place];  Service: Endoscopy;  Laterality: N/A;   [place]   POLYPECTOMY  07/04/2016   Procedure: POLYPECTOMY;  Surgeon: [name], MD;  Location: [place];  Service: Endoscopy;;   TONSILLECTOMY     Patient Active Problem List   Diagnosis Date Noted   Persistent atrial fibrillation ([place])    Graves disease    Abdominal bloating    Atrial fibrillation with RVR ([place]) 10/01/2019   Hyperthyroidism    Atrial fibrillation with rapid ventricular response ([place]) 09/30/2019   Leg edema    Left leg cellulitis    Special screening for malignant neoplasms, colon    Polyp of sigmoid colon    Benign neoplasm of ascending colon    Rectal polyp     Herniated nucleus pulposus, L5-S1 11/09/2015   Low back pain 10/25/2015   Herniated nucleus pulposus, C5-6 right 10/06/2015   Dysesthesia 09/16/2015   Spasticity 09/16/2015   Unsteady gait 09/16/2015   Multiple sclerosis ([place]) 06/08/2011     REFERRING DIAG: R26.81 (ICD-10-CM) - Unsteadiness on feet   THERAPY DIAG:  Unsteadiness on feet  Difficulty in walking, not elsewhere classified  Abnormality of gait and mobility  Muscle weakness (generalized)  Rationale for Evaluation and Treatment Rehabilitation  PERTINENT HISTORY: Patient presents to physical therapy for unsteadiness, walking instability, fatigue. His PMH includes paroxysmal a fib, MS, lymphedema, hypothyroidism, Graves Disease, arthritis, lumbar herniated disc (L5), depression and neuropathy. Patient first developed symptoms of MS in 2005 and was diagnosed in 2006. Patient has done PT in the past, but hasn't been seen since 2020 at [facility]. Has not been doing his exercises in the past year. Walk outside to garage to ride lawnmower, walks in house. Retired Automotive engineer. Has a rollator at home but doesn't use it in the house   PRECAUTIONS: Fall  SUBJECTIVE:  Patient reports he had a good trip to beach to see his mother and states feeling okay today- denies any falls or pain.   PAIN:  Are you having pain? No    TODAY'S TREATMENT:  THEREX:   Nustep L0 for 3 min 30 sec then 30 sec each for level 4,5, and 6 then back down to L2 for 1 min; L5 for 1 min; L7 for 1 min (RPE= 6/10) ; then L5, 3, 1 for 20 sec each  for cardiovasular response- Close monitoring for exertion- RPE= 3/10   Forward stepping using agility ladder in // bars - "challenging as my feet were more narrowed in the bars" - down and back x 5 using 3# AW   Side stepping in // bars using agility ladder- down and back x 4 with 3#AW  Step up onto 6" step with 3#AW  Step tap onto appropriate colored cone with minimal UE support    - Resistive  gait with 3# AW- 1020 feet today using rollator focusing on mm endurance. Patient performed well overall - reciprocal steps and no excessive fatigue today. Patient reported some Left LE weakness yet able to clear feet and take reciprocal steps throughout.      PATIENT EDUCATION: Education details: exercise technique, body mechanics Person educated: Patient Education method: Customer service manager, verbal cues Education comprehension: verbalized understanding, returned demonstration    HOME EXERCISE PROGRAM: No changes this session   PT Short Term Goals -       PT SHORT TERM GOAL #1   Title Patient will be independent in home exercise program to improve strength/mobility for better functional independence with ADLs.    Baseline 8/9: HEP to be given next session, 10/31: patient reports compliance with HEP and would like progressions added next session. 08/22/2021=Patient verbalized that he is walking and doing his LE exercises with no questions at this time.    Time 4    Period Weeks    Status Achieved    Target Date 08/16/21              PT Long Term Goals       PT LONG TERM GOAL #1   Title Patient will increase FOTO score to equal to or greater than 70%  to demonstrate statistically significant improvement in mobility and quality of life.    Baseline 8/9: 64%; risk adjusted 34%; 03/21/21 FOTO: 45; 04/24/21 FOTO: 49% ; 08/22/2021= 56% 4/20: 60% 11/28/21: 63%; 01/04/2022= 60%. 03/22/2022= 73%   Time 12    Period Weeks    Status Achieved   Target Date 06/26/2022     PT LONG TERM GOAL #2   Title Patient will increase Berg Balance score by > 6 points to demonstrate decreased fall risk during functional activities.    Baseline 4/20: 43/56 6/6: 48/56; 713/2023= 50/56   Time 12    Period Weeks    Status Achieved   Target Date 03/29/2022     PT LONG TERM GOAL #3   Title Patient will increase 10 meter walk test to >1.66ms as to improve gait speed for better community  ambulation and to reduce fall risk.    Baseline 8/9: 0.65 m/s with rollator; 10/31: 0.68 m/s with rollator. 07/06/2021= 0.71 m/s using rollator; 08/22/2021= 0.94 m/s using rollator. 4/20: 1.1 m/s with rollator    Time 12    Period Weeks    Status Achieved    Target Date 10/11/21      PT LONG TERM GOAL #4   Title Patient will increase six minute walk test distance to >1000 for progression to community ambulator and improve gait ability    Baseline  8/9: 505 ft with rollator; 03/21/21: 762f c rollator, 04/24/21: 7531fc rollator; 07/06/2021=900 feet; 08/22/2021= 935 feet with use of Rollator 44/20: 940 ft 11/28/21: 1055 feet with rollator   Time 12    Period Weeks    Status Achieved    Target Date 01/04/22      PT LONG TERM GOAL #5   Title Patient will increase glute medius strength on Left LE from able to lift off 3" (sidelye on mat) to > 5" to improve stability, gait mechanics, and functional strength.    Baseline 10/31: 3-/5; 07/06/2021= 3-/5; 08/22/2021=3-/5 unable to achieve full ROM yet able to withstand some resistance with testing 4/20: unable to obtainfull ROM against gravity 6/6: unable to move against gravity; 01/04/2022= Continues to have difficulty - unable to raise left LE in sidelye against gravity but able to perform supine left Hip abd with some resistance= 3-/5; 03/22/2022- Patient able to raise his left LE 3 in off mat today. 04/03/2022= Patient able to raise left LE 3" off mat in sidelye today; 05/01/2022= 05/01/2022= patient able to raise left LE from sidelye position= 5"; 06/12/2022- Patient presents with 6.25 in sidelye Left hip abd against gravity. 06/26/2022= 7in in sidelye position.    Time 12    Period Weeks    Status MET   Target Date 06/26/2022     PT LONG TERM GOAL #6   Title Patient will increase Left single leg stance time to 15 seconds or greater to increase safety in shower and independence with ADLs.    Baseline 10/31; 1.83 sec without UE support; 08/22/2021=2-3  sec  on  left; 6 sec on right 4/20: RLE 10 seconds LLE unable to perform 6/6: R LE 4 seconds during BERG; 01/04/2022= left = 3 sec and right = 8 sec; 02/13/2022= 20 sec on right at best and 4 sec on left LE at best. 03/23/2022=Patient able to stand on left LE at best for 7 sec today. 04/03/2022= 27 sec on right and 12 sec on left; 06/12/2022= 13 sec on left  and 33 sec at best on right LE; 06/26/2021= 11 sec on left at best and 22 sec on right today; 07/26/2022= 10 sec on left and 20 sec on right today.    Time 12    Period Weeks    Status On-going    Target Date 09/18/2022     PT LONG TERM GOAL #7   Title Patient will safely negotiate 13 steps with handrails to safely enter/exit mothers house.    Baseline 4/20: very challenging 6/6: able to copmlete safely even managing dogs per pt report; 01/04/2022- Patient performed 16 steps with B Rail-no difficulty other than fatigue.    Time 12    Period Weeks    Status Achieved   Target Date 01/04/22    PT LONG TERM GOAL #8  Title Patient will increase six minute walk test distance to >1100 ft.  for progression to community ambulator and improve gait ability   Baseline 04/03/2022= 920 feet with 4WW - (Added another goal for endurance to continue to progress community amb distances); 05/03/2022= 960 feet with upright 4WW (patient's first time using device): 06/12/2022= 990 feet with 4WW; 06/26/2022= 1010 feet with 4WW; 07/26/2022= 1015 feet  Time 12   Period Weeks   Status Progressing  Target Date 09/18/2022       PT LONG TERM GOAL #9  Title Patient will be able to perform functional activities such as vacuuming or using backpack sprayer  with modified independence for improved household chore abilities   Baseline 06/26/2022= Patient unable to perform either task but has strong desire to return to performing if able. 07/26/2022- Patient has not attempted to vacuum yet and PT session focusing on LE strengthening.   Time 12   Period Weeks   Status Ongoing  Target Date  09/18/2022   PT LONG TERM GOAL #10  Title Patient will increase glute medius strength on Left LE from able to lift off 7" (sidelye on mat) to > 10" to improve Hip stability with standing, gait mechanics, and functional strength.   Baseline 06/26/2022= 7in in sidelye position. 07/26/2022- 7.5 in  Time 12   Period Weeks   Status ONGOING  Target Date 09/18/2022     Plan - 12/13/21 1340     Clinical Impression Statement Patient continues to perform well overall and focusing on LE strengthening. He does require VC for all exercise technique yet able to advance Left LE and no significant hyperextension of left LE with stance or weight bearing on left LE.  Patient will continue to benefit from skilled PT services to continue to improve overall LE strength and functional mobility for optimal independent function.    Personal Factors and Comorbidities Age;Comorbidity 3+;Fitness;Past/Current Experience;Time since onset of injury/illness/exacerbation    Comorbidities aroxysmal a fib, MS, lymphedema, hypothyroidism, Graves Disease, arthritis, lumbar herniated disc (L5), depression and neuropathy    Examination-Activity Limitations Bed Mobility;Bend;Caring for Others;Carry;Reach Overhead;Locomotion Level;Lift;Hygiene/Grooming;Dressing;Squat;Stairs;Stand;Toileting;Transfers    Examination-Participation Restrictions Cleaning;Community Activity;Laundry;Occupation;Shop;Volunteer;Yard Work    Merchant navy officer Evolving/Moderate complexity    Rehab Potential Fair    PT Frequency 2x / week    PT Duration 12 weeks    PT Treatment/Interventions ADLs/Self Care Home Management;Aquatic Therapy;Canalith Repostioning;Biofeedback;Cryotherapy;Ultrasound;Traction;Moist Heat;Electrical Stimulation;DME Instruction;Gait training;Therapeutic exercise;Therapeutic activities;Functional mobility training;Stair training;Balance training;Neuromuscular re-education;Patient/family education;Manual techniques;Orthotic  Fit/Training;Compression bandaging;Passive range of motion;Vestibular;Taping;Splinting;Energy conservation;Dry needling;Visual/perceptual remediation/compensation    PT Next Visit Plan Continue with progessive balance training, Progressive LE strenthening as appropriate.    PT Home Exercise Plan Access Code: [email or web address] URL: [email or web address]  Added Bridges with BTB and encouraged more walking around the home during commerical breaks when watching TV.    Consulted and Agree with Plan of Care Patient            2:50 PM, 08/08/22 Ollen Bowl, PT Physical Therapist - [place] [phone]     08/08/22, 2:50 PM

## 2022-08-09 ENCOUNTER — Ambulatory Visit: Payer: Medicare PPO

## 2022-08-09 DIAGNOSIS — M6281 Muscle weakness (generalized): Secondary | ICD-10-CM

## 2022-08-09 DIAGNOSIS — R2689 Other abnormalities of gait and mobility: Secondary | ICD-10-CM

## 2022-08-09 DIAGNOSIS — R2681 Unsteadiness on feet: Secondary | ICD-10-CM

## 2022-08-09 DIAGNOSIS — R262 Difficulty in walking, not elsewhere classified: Secondary | ICD-10-CM | POA: Diagnosis not present

## 2022-08-09 DIAGNOSIS — R278 Other lack of coordination: Secondary | ICD-10-CM | POA: Diagnosis not present

## 2022-08-09 DIAGNOSIS — R269 Unspecified abnormalities of gait and mobility: Secondary | ICD-10-CM | POA: Diagnosis not present

## 2022-08-09 NOTE — Therapy (Signed)
OUTPATIENT PHYSICAL THERAPY TREATMENT NOTE   Patient Name: Dustin Gray MRN: [id number] DOB:[birth year], 67 y.o., male Today's Date: 08/09/2022  PCP: [name], MD REFERRING PROVIDER: [name], MD   PT End of Session - 08/09/22 1430     Visit Number 114    Number of Visits 126    Date for PT Re-Evaluation 09/18/22    Authorization Time Period 06/26/2022-09/18/2022    Progress Note Due on Visit 120    PT Start Time 1430    PT Stop Time 1513    PT Time Calculation (min) 43 min    Activity Tolerance Patient tolerated treatment well    Behavior During Therapy WFL for tasks assessed/performed                      Past Medical History:  Diagnosis Date   Abscess    groin   Arthritis    lower spine   Erectile dysfunction    Low testosterone    Lumbar herniated disc    L5   Multiple sclerosis ([place])    Staph aureus infection    Past Surgical History:  Procedure Laterality Date   CARDIOVERSION N/A 11/22/2021   Procedure: CARDIOVERSION;  Surgeon: [name], MD;  Location: [facility];  Service: Cardiovascular;  Laterality: N/A;   COLONOSCOPY WITH PROPOFOL N/A 07/04/2016   Procedure: COLONOSCOPY WITH PROPOFOL;  Surgeon: [name], MD;  Location: [place];  Service: Endoscopy;  Laterality: N/A;   [place]   POLYPECTOMY  07/04/2016   Procedure: POLYPECTOMY;  Surgeon: [name], MD;  Location: [place];  Service: Endoscopy;;   TONSILLECTOMY     Patient Active Problem List   Diagnosis Date Noted   Persistent atrial fibrillation ([place])    Graves disease    Abdominal bloating    Atrial fibrillation with RVR ([place]) 10/01/2019   Hyperthyroidism    Atrial fibrillation with rapid ventricular response ([place]) 09/30/2019   Leg edema    Left leg cellulitis    Special screening for malignant neoplasms, colon    Polyp of sigmoid colon    Benign neoplasm of ascending colon    Rectal polyp     Herniated nucleus pulposus, L5-S1 11/09/2015   Low back pain 10/25/2015   Herniated nucleus pulposus, C5-6 right 10/06/2015   Dysesthesia 09/16/2015   Spasticity 09/16/2015   Unsteady gait 09/16/2015   Multiple sclerosis ([place]) 06/08/2011     REFERRING DIAG: R26.81 (ICD-10-CM) - Unsteadiness on feet   THERAPY DIAG:  Unsteadiness on feet  Difficulty in walking, not elsewhere classified  Abnormality of gait and mobility  Muscle weakness (generalized)  Rationale for Evaluation and Treatment Rehabilitation  PERTINENT HISTORY: Patient presents to physical therapy for unsteadiness, walking instability, fatigue. His PMH includes paroxysmal a fib, MS, lymphedema, hypothyroidism, Graves Disease, arthritis, lumbar herniated disc (L5), depression and neuropathy. Patient first developed symptoms of MS in 2005 and was diagnosed in 2006. Patient has done PT in the past, but hasn't been seen since 2020 at [facility]. Has not been doing his exercises in the past year. Walk outside to garage to ride lawnmower, walks in house. 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 No    TODAY'S TREATMENT:  THEREX:   1st round circuit stye workout Seated LAQ with 5# AW on right LE and 3# AW on left LE x 12 reps Seated Hip march with 5#AW on right and 3#AW on left LE x 12 reps  Resistive gait with 5# AW RLE and 3# AW LLE x 160 feet  2nd round- same as above- RPE = 4/10- O2 sat=97% and HR=78 bpm  3rd round  - Sit to stand without UE support x12  - side step up/over  -Resistive gait with 5# AW RLE and 3# AW LLE x 160 feet  4th round - same as 3rd- O2 sat= 96% and HR= 78 bpm   5th round-  Calf raises 3# LLE and 5#RLE x 12 reps Standing ham curls x 12 reps alt LE with 3# Left LE and 5# RLE      PATIENT  EDUCATION: Education details: exercise technique, body mechanics Person educated: Patient Education method: Customer service manager, verbal cues Education comprehension: verbalized understanding, returned demonstration    HOME EXERCISE PROGRAM: No changes this session   PT Short Term Goals -       PT SHORT TERM GOAL #1   Title Patient will be independent in home exercise program to improve strength/mobility for better functional independence with ADLs.    Baseline 8/9: HEP to be given next session, 10/31: patient reports compliance with HEP and would like progressions added next session. 08/22/2021=Patient verbalized that he is walking and doing his LE exercises with no questions at this time.    Time 4    Period Weeks    Status Achieved    Target Date 08/16/21              PT Long Term Goals       PT LONG TERM GOAL #1   Title Patient will increase FOTO score to equal to or greater than 70%  to demonstrate statistically significant improvement in mobility and quality of life.    Baseline 8/9: 64%; risk adjusted 34%; 03/21/21 FOTO: 45; 04/24/21 FOTO: 49% ; 08/22/2021= 56% 4/20: 60% 11/28/21: 63%; 01/04/2022= 60%. 03/22/2022= 73%   Time 12    Period Weeks    Status Achieved   Target Date 06/26/2022     PT LONG TERM GOAL #2   Title Patient will increase Berg Balance score by > 6 points to demonstrate decreased fall risk during functional activities.    Baseline 4/20: 43/56 6/6: 48/56; 713/2023= 50/56   Time 12    Period Weeks    Status Achieved   Target Date 03/29/2022     PT LONG TERM GOAL #3   Title Patient will increase 10 meter walk test to >1.73ms as to improve gait speed for better community ambulation and to reduce fall risk.    Baseline 8/9: 0.65 m/s with rollator; 10/31: 0.68 m/s with rollator. 07/06/2021= 0.71 m/s using rollator; 08/22/2021= 0.94 m/s using rollator. 4/20: 1.1 m/s with rollator    Time 12    Period Weeks    Status Achieved    Target Date 10/11/21       PT LONG TERM GOAL #4   Title Patient will increase six minute walk test distance to >1000 for progression to community ambulator and improve gait ability    Baseline 8/9: 505 ft with rollator; 03/21/21: 7726fc rollator, 04/24/21: 7552f rollator; 07/06/2021=900 feet; 08/22/2021= 935 feet with use of Rollator 44/20: 940 ft 11/28/21: 1055 feet with rollator   Time 12  Period Weeks    Status Achieved    Target Date 01/04/22      PT LONG TERM GOAL #5   Title Patient will increase glute medius strength on Left LE from able to lift off 3" (sidelye on mat) to > 5" to improve stability, gait mechanics, and functional strength.    Baseline 10/31: 3-/5; 07/06/2021= 3-/5; 08/22/2021=3-/5 unable to achieve full ROM yet able to withstand some resistance with testing 4/20: unable to obtainfull ROM against gravity 6/6: unable to move against gravity; 01/04/2022= Continues to have difficulty - unable to raise left LE in sidelye against gravity but able to perform supine left Hip abd with some resistance= 3-/5; 03/22/2022- Patient able to raise his left LE 3 in off mat today. 04/03/2022= Patient able to raise left LE 3" off mat in sidelye today; 05/01/2022= 05/01/2022= patient able to raise left LE from sidelye position= 5"; 06/12/2022- Patient presents with 6.25 in sidelye Left hip abd against gravity. 06/26/2022= 7in in sidelye position.    Time 12    Period Weeks    Status MET   Target Date 06/26/2022     PT LONG TERM GOAL #6   Title Patient will increase Left single leg stance time to 15 seconds or greater to increase safety in shower and independence with ADLs.    Baseline 10/31; 1.83 sec without UE support; 08/22/2021=2-3  sec  on left; 6 sec on right 4/20: RLE 10 seconds LLE unable to perform 6/6: R LE 4 seconds during BERG; 01/04/2022= left = 3 sec and right = 8 sec; 02/13/2022= 20 sec on right at best and 4 sec on left LE at best. 03/23/2022=Patient able to stand on left LE at best for 7 sec today. 04/03/2022= 27  sec on right and 12 sec on left; 06/12/2022= 13 sec on left  and 33 sec at best on right LE; 06/26/2021= 11 sec on left at best and 22 sec on right today; 07/26/2022= 10 sec on left and 20 sec on right today.    Time 12    Period Weeks    Status On-going    Target Date 09/18/2022     PT LONG TERM GOAL #7   Title Patient will safely negotiate 13 steps with handrails to safely enter/exit mothers house.    Baseline 4/20: very challenging 6/6: able to copmlete safely even managing dogs per pt report; 01/04/2022- Patient performed 16 steps with B Rail-no difficulty other than fatigue.    Time 12    Period Weeks    Status Achieved   Target Date 01/04/22    PT LONG TERM GOAL #8  Title Patient will increase six minute walk test distance to >1100 ft.  for progression to community ambulator and improve gait ability   Baseline 04/03/2022= 920 feet with 4WW - (Added another goal for endurance to continue to progress community amb distances); 05/03/2022= 960 feet with upright 4WW (patient's first time using device): 06/12/2022= 990 feet with 4WW; 06/26/2022= 1010 feet with 4WW; 07/26/2022= 1015 feet  Time 12   Period Weeks   Status Progressing  Target Date 09/18/2022       PT LONG TERM GOAL #9  Title Patient will be able to perform functional activities such as vacuuming or using backpack sprayer with modified independence for improved household chore abilities   Baseline 06/26/2022= Patient unable to perform either task but has strong desire to return to performing if able. 07/26/2022- Patient has not attempted to vacuum yet and  PT session focusing on LE strengthening.   Time 12   Period Weeks   Status Ongoing  Target Date 09/18/2022   PT LONG TERM GOAL #10  Title Patient will increase glute medius strength on Left LE from able to lift off 7" (sidelye on mat) to > 10" to improve Hip stability with standing, gait mechanics, and functional strength.   Baseline 06/26/2022= 7in in sidelye position. 07/26/2022- 7.5  in  Time 12   Period Weeks   Status ONGOING  Target Date 09/18/2022     Plan - 12/13/21 1340     Clinical Impression Statement Patient presents as motivated and continues to work hard- focusing today on circuit style workout. He was able to go from one activity to next with minimal to no rest and no observed left knee buckling. He continues to clear his left LE with ambulation with resistance with only fatigue as limiting factor. Patient will continue to benefit from skilled PT services to continue to improve overall LE strength and functional mobility for optimal independent function.    Personal Factors and Comorbidities Age;Comorbidity 3+;Fitness;Past/Current Experience;Time since onset of injury/illness/exacerbation    Comorbidities aroxysmal a fib, MS, lymphedema, hypothyroidism, Graves Disease, arthritis, lumbar herniated disc (L5), depression and neuropathy    Examination-Activity Limitations Bed Mobility;Bend;Caring for Others;Carry;Reach Overhead;Locomotion Level;Lift;Hygiene/Grooming;Dressing;Squat;Stairs;Stand;Toileting;Transfers    Examination-Participation Restrictions Cleaning;Community Activity;Laundry;Occupation;Shop;Volunteer;Yard Work    Merchant navy officer Evolving/Moderate complexity    Rehab Potential Fair    PT Frequency 2x / week    PT Duration 12 weeks    PT Treatment/Interventions ADLs/Self Care Home Management;Aquatic Therapy;Canalith Repostioning;Biofeedback;Cryotherapy;Ultrasound;Traction;Moist Heat;Electrical Stimulation;DME Instruction;Gait training;Therapeutic exercise;Therapeutic activities;Functional mobility training;Stair training;Balance training;Neuromuscular re-education;Patient/family education;Manual techniques;Orthotic Fit/Training;Compression bandaging;Passive range of motion;Vestibular;Taping;Splinting;Energy conservation;Dry needling;Visual/perceptual remediation/compensation    PT Next Visit Plan Continue with progessive balance training,  Progressive LE strenthening as appropriate.    PT Home Exercise Plan Access Code: [email or web address] URL: [email or web address]  Added Bridges with BTB and encouraged more walking around the home during commerical breaks when watching TV.    Consulted and Agree with Plan of Care Patient            4:40 PM, 08/09/22 Ollen Bowl, PT Physical Therapist - [place] [phone]     08/09/22, 4:40 PM

## 2022-08-14 ENCOUNTER — Ambulatory Visit: Payer: Medicare PPO

## 2022-08-14 DIAGNOSIS — R269 Unspecified abnormalities of gait and mobility: Secondary | ICD-10-CM | POA: Diagnosis not present

## 2022-08-14 DIAGNOSIS — R262 Difficulty in walking, not elsewhere classified: Secondary | ICD-10-CM | POA: Diagnosis not present

## 2022-08-14 DIAGNOSIS — R2689 Other abnormalities of gait and mobility: Secondary | ICD-10-CM | POA: Diagnosis not present

## 2022-08-14 DIAGNOSIS — R2681 Unsteadiness on feet: Secondary | ICD-10-CM | POA: Diagnosis not present

## 2022-08-14 DIAGNOSIS — M6281 Muscle weakness (generalized): Secondary | ICD-10-CM

## 2022-08-14 DIAGNOSIS — R278 Other lack of coordination: Secondary | ICD-10-CM

## 2022-08-14 NOTE — Therapy (Signed)
OUTPATIENT PHYSICAL THERAPY TREATMENT NOTE   Patient Name: Dustin Gray MRN: [id number] DOB:[birth year], 67 y.o., male Today's Date: 08/14/2022  PCP: [name], MD REFERRING PROVIDER: [name], MD   PT End of Session - 08/14/22 1438     Visit Number 115    Number of Visits 126    Date for PT Re-Evaluation 09/18/22    Authorization Time Period 06/26/2022-09/18/2022    Progress Note Due on Visit 120    PT Start Time 1431    PT Stop Time 1501    PT Time Calculation (min) 30 min    Equipment Utilized During Treatment Gait belt    Activity Tolerance Patient tolerated treatment well    Behavior During Therapy WFL for tasks assessed/performed                      Past Medical History:  Diagnosis Date   Abscess    groin   Arthritis    lower spine   Erectile dysfunction    Low testosterone    Lumbar herniated disc    L5   Multiple sclerosis ([place])    Staph aureus infection    Past Surgical History:  Procedure Laterality Date   CARDIOVERSION N/A 11/22/2021   Procedure: CARDIOVERSION;  Surgeon: [name], MD;  Location: [facility];  Service: Cardiovascular;  Laterality: N/A;   COLONOSCOPY WITH PROPOFOL N/A 07/04/2016   Procedure: COLONOSCOPY WITH PROPOFOL;  Surgeon: [name], MD;  Location: [place];  Service: Endoscopy;  Laterality: N/A;   [place]   POLYPECTOMY  07/04/2016   Procedure: POLYPECTOMY;  Surgeon: [name], MD;  Location: [place];  Service: Endoscopy;;   TONSILLECTOMY     Patient Active Problem List   Diagnosis Date Noted   Persistent atrial fibrillation ([place])    Graves disease    Abdominal bloating    Atrial fibrillation with RVR ([place]) 10/01/2019   Hyperthyroidism    Atrial fibrillation with rapid ventricular response ([place]) 09/30/2019   Leg edema    Left leg cellulitis    Special screening for malignant neoplasms, colon    Polyp of sigmoid colon     Benign neoplasm of ascending colon    Rectal polyp    Herniated nucleus pulposus, L5-S1 11/09/2015   Low back pain 10/25/2015   Herniated nucleus pulposus, C5-6 right 10/06/2015   Dysesthesia 09/16/2015   Spasticity 09/16/2015   Unsteady gait 09/16/2015   Multiple sclerosis ([place]) 06/08/2011     REFERRING DIAG: R26.81 (ICD-10-CM) - Unsteadiness on feet   THERAPY DIAG:  Unsteadiness on feet  Difficulty in walking, not elsewhere classified  Abnormality of gait and mobility  Muscle weakness (generalized)  Rationale for Evaluation and Treatment Rehabilitation  PERTINENT HISTORY: Patient presents to physical therapy for unsteadiness, walking instability, fatigue. His PMH includes paroxysmal a fib, MS, lymphedema, hypothyroidism, Graves Disease, arthritis, lumbar herniated disc (L5), depression and neuropathy. Patient first developed symptoms of MS in 2005 and was diagnosed in 2006. Patient has done PT in the past, but hasn't been seen since 2020 at [facility]. Has not been doing his exercises in the past year. Walk outside to garage to ride lawnmower, walks in house. 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   PAIN:  Are you having pain? No  TODAY'S TREATMENT:  THEREX:   Standing hip ext @matrix$  cable system- 7.5# 2 sets of 12 reps Standing hip abd @ matrix cable system- 7.5# 2 sets of 12 reps Resistive gait with 4#AW x 1000 feet.  Sit to stand x 12 reps wihtout UE Support    PATIENT EDUCATION: Education details: exercise technique, body mechanics Person educated: Patient Education method: Customer service manager, verbal cues Education comprehension: verbalized understanding, returned demonstration    HOME EXERCISE PROGRAM: No changes this session   PT Short Term Goals -       PT SHORT TERM GOAL #1   Title Patient will be  independent in home exercise program to improve strength/mobility for better functional independence with ADLs.    Baseline 8/9: HEP to be given next session, 10/31: patient reports compliance with HEP and would like progressions added next session. 08/22/2021=Patient verbalized that he is walking and doing his LE exercises with no questions at this time.    Time 4    Period Weeks    Status Achieved    Target Date 08/16/21              PT Long Term Goals       PT LONG TERM GOAL #1   Title Patient will increase FOTO score to equal to or greater than 70%  to demonstrate statistically significant improvement in mobility and quality of life.    Baseline 8/9: 64%; risk adjusted 34%; 03/21/21 FOTO: 45; 04/24/21 FOTO: 49% ; 08/22/2021= 56% 4/20: 60% 11/28/21: 63%; 01/04/2022= 60%. 03/22/2022= 73%   Time 12    Period Weeks    Status Achieved   Target Date 06/26/2022     PT LONG TERM GOAL #2   Title Patient will increase Berg Balance score by > 6 points to demonstrate decreased fall risk during functional activities.    Baseline 4/20: 43/56 6/6: 48/56; 713/2023= 50/56   Time 12    Period Weeks    Status Achieved   Target Date 03/29/2022     PT LONG TERM GOAL #3   Title Patient will increase 10 meter walk test to >1.83ms as to improve gait speed for better community ambulation and to reduce fall risk.    Baseline 8/9: 0.65 m/s with rollator; 10/31: 0.68 m/s with rollator. 07/06/2021= 0.71 m/s using rollator; 08/22/2021= 0.94 m/s using rollator. 4/20: 1.1 m/s with rollator    Time 12    Period Weeks    Status Achieved    Target Date 10/11/21      PT LONG TERM GOAL #4   Title Patient will increase six minute walk test distance to >1000 for progression to community ambulator and improve gait ability    Baseline 8/9: 505 ft with rollator; 03/21/21: 7790fc rollator, 04/24/21: 75562f rollator; 07/06/2021=900 feet; 08/22/2021= 935 feet with use of Rollator 44/20: 940 ft 11/28/21: 1055 feet with rollator    Time 12    Period Weeks    Status Achieved    Target Date 01/04/22      PT LONG TERM GOAL #5   Title Patient will increase glute medius strength on Left LE from able to lift off 3" (sidelye on mat) to > 5" to improve stability, gait mechanics, and functional strength.    Baseline 10/31: 3-/5; 07/06/2021= 3-/5; 08/22/2021=3-/5 unable to achieve full ROM yet able to withstand some resistance with testing 4/20: unable to obtainfull ROM against gravity 6/6: unable to move against gravity; 01/04/2022= Continues to have difficulty -  unable to raise left LE in sidelye against gravity but able to perform supine left Hip abd with some resistance= 3-/5; 03/22/2022- Patient able to raise his left LE 3 in off mat today. 04/03/2022= Patient able to raise left LE 3" off mat in sidelye today; 05/01/2022= 05/01/2022= patient able to raise left LE from sidelye position= 5"; 06/12/2022- Patient presents with 6.25 in sidelye Left hip abd against gravity. 06/26/2022= 7in in sidelye position.    Time 12    Period Weeks    Status MET   Target Date 06/26/2022     PT LONG TERM GOAL #6   Title Patient will increase Left single leg stance time to 15 seconds or greater to increase safety in shower and independence with ADLs.    Baseline 10/31; 1.83 sec without UE support; 08/22/2021=2-3  sec  on left; 6 sec on right 4/20: RLE 10 seconds LLE unable to perform 6/6: R LE 4 seconds during BERG; 01/04/2022= left = 3 sec and right = 8 sec; 02/13/2022= 20 sec on right at best and 4 sec on left LE at best. 03/23/2022=Patient able to stand on left LE at best for 7 sec today. 04/03/2022= 27 sec on right and 12 sec on left; 06/12/2022= 13 sec on left  and 33 sec at best on right LE; 06/26/2021= 11 sec on left at best and 22 sec on right today; 07/26/2022= 10 sec on left and 20 sec on right today.    Time 12    Period Weeks    Status On-going    Target Date 09/18/2022     PT LONG TERM GOAL #7   Title Patient will safely negotiate 13 steps with  handrails to safely enter/exit mothers house.    Baseline 4/20: very challenging 6/6: able to copmlete safely even managing dogs per pt report; 01/04/2022- Patient performed 16 steps with B Rail-no difficulty other than fatigue.    Time 12    Period Weeks    Status Achieved   Target Date 01/04/22    PT LONG TERM GOAL #8  Title Patient will increase six minute walk test distance to >1100 ft.  for progression to community ambulator and improve gait ability   Baseline 04/03/2022= 920 feet with 4WW - (Added another goal for endurance to continue to progress community amb distances); 05/03/2022= 960 feet with upright 4WW (patient's first time using device): 06/12/2022= 990 feet with 4WW; 06/26/2022= 1010 feet with 4WW; 07/26/2022= 1015 feet  Time 12   Period Weeks   Status Progressing  Target Date 09/18/2022       PT LONG TERM GOAL #9  Title Patient will be able to perform functional activities such as vacuuming or using backpack sprayer with modified independence for improved household chore abilities   Baseline 06/26/2022= Patient unable to perform either task but has strong desire to return to performing if able. 07/26/2022- Patient has not attempted to vacuum yet and PT session focusing on LE strengthening.   Time 12   Period Weeks   Status Ongoing  Target Date 09/18/2022   PT LONG TERM GOAL #10  Title Patient will increase glute medius strength on Left LE from able to lift off 7" (sidelye on mat) to > 10" to improve Hip stability with standing, gait mechanics, and functional strength.   Baseline 06/26/2022= 7in in sidelye position. 07/26/2022- 7.5 in  Time 12   Period Weeks   Status ONGOING  Target Date 09/18/2022     Plan - 12/13/21  1340     Clinical Impression Statement Treatment limited to patient reporting he needed to leave early to attend another appointment. He responded well to resistive LE strengthening and ambulating further with resistance today without report of significant fatigue.   Patient will continue to benefit from skilled PT services to continue to improve overall LE strength and functional mobility for optimal independent function.    Personal Factors and Comorbidities Age;Comorbidity 3+;Fitness;Past/Current Experience;Time since onset of injury/illness/exacerbation    Comorbidities aroxysmal a fib, MS, lymphedema, hypothyroidism, Graves Disease, arthritis, lumbar herniated disc (L5), depression and neuropathy    Examination-Activity Limitations Bed Mobility;Bend;Caring for Others;Carry;Reach Overhead;Locomotion Level;Lift;Hygiene/Grooming;Dressing;Squat;Stairs;Stand;Toileting;Transfers    Examination-Participation Restrictions Cleaning;Community Activity;Laundry;Occupation;Shop;Volunteer;Yard Work    Merchant [employer] officer Evolving/Moderate complexity    Rehab Potential Fair    PT Frequency 2x / week    PT Duration 12 weeks    PT Treatment/Interventions ADLs/Self Care Home Management;Aquatic Therapy;Canalith Repostioning;Biofeedback;Cryotherapy;Ultrasound;Traction;Moist Heat;Electrical Stimulation;DME Instruction;Gait training;Therapeutic exercise;Therapeutic activities;Functional mobility training;Stair training;Balance training;Neuromuscular re-education;Patient/family education;Manual techniques;Orthotic Fit/Training;Compression bandaging;Passive range of motion;Vestibular;Taping;Splinting;Energy conservation;Dry needling;Visual/perceptual remediation/compensation    PT Next Visit Plan Continue with progessive balance training, Progressive LE strenthening as appropriate.    PT Home Exercise Plan Access Code: [email or web address] URL: [email or web address]  Added Bridges with BTB and encouraged more walking around the home during commerical breaks when watching TV.    Consulted and Agree with Plan of Care Patient            4:39 PM, 08/14/22 Ollen Bowl, PT Physical Therapist - [place] [phone]     08/14/22, 4:39 PM

## 2022-08-16 ENCOUNTER — Ambulatory Visit: Payer: Medicare PPO

## 2022-08-16 DIAGNOSIS — R2681 Unsteadiness on feet: Secondary | ICD-10-CM | POA: Diagnosis not present

## 2022-08-16 DIAGNOSIS — R2689 Other abnormalities of gait and mobility: Secondary | ICD-10-CM | POA: Diagnosis not present

## 2022-08-16 DIAGNOSIS — M6281 Muscle weakness (generalized): Secondary | ICD-10-CM | POA: Diagnosis not present

## 2022-08-16 DIAGNOSIS — R278 Other lack of coordination: Secondary | ICD-10-CM | POA: Diagnosis not present

## 2022-08-16 DIAGNOSIS — R269 Unspecified abnormalities of gait and mobility: Secondary | ICD-10-CM | POA: Diagnosis not present

## 2022-08-16 DIAGNOSIS — R262 Difficulty in walking, not elsewhere classified: Secondary | ICD-10-CM

## 2022-08-16 NOTE — Therapy (Signed)
OUTPATIENT PHYSICAL THERAPY TREATMENT NOTE   Patient Name: Dustin Gray MRN: [id number] DOB:[birth year], 67 y.o., male Today's Date: 08/16/2022  PCP: [name], MD REFERRING PROVIDER: [name], MD   PT End of Session - 08/16/22 1434     Visit Number [id number]    Number of Visits 126    Date for PT Re-Evaluation 09/18/22    Authorization Time Period 06/26/2022-09/18/2022    Progress Note Due on Visit 120    PT Start Time 1433    PT Stop Time 1515    PT Time Calculation (min) 42 min    Equipment Utilized During Treatment Gait belt    Activity Tolerance Patient tolerated treatment well    Behavior During Therapy WFL for tasks assessed/performed                      Past Medical History:  Diagnosis Date   Abscess    groin   Arthritis    lower spine   Erectile dysfunction    Low testosterone    Lumbar herniated disc    L5   Multiple sclerosis ([place])    Staph aureus infection    Past Surgical History:  Procedure Laterality Date   CARDIOVERSION N/A 11/22/2021   Procedure: CARDIOVERSION;  Surgeon: [name], MD;  Location: [facility];  Service: Cardiovascular;  Laterality: N/A;   COLONOSCOPY WITH PROPOFOL N/A 07/04/2016   Procedure: COLONOSCOPY WITH PROPOFOL;  Surgeon: [name], MD;  Location: [place];  Service: Endoscopy;  Laterality: N/A;   Person   POLYPECTOMY  07/04/2016   Procedure: POLYPECTOMY;  Surgeon: [name], MD;  Location: [place];  Service: Endoscopy;;   TONSILLECTOMY     Patient Active Problem List   Diagnosis Date Noted   Persistent atrial fibrillation ([place])    Graves disease    Abdominal bloating    Atrial fibrillation with RVR ([place]) 10/01/2019   Hyperthyroidism    Atrial fibrillation with rapid ventricular response ([place]) 09/30/2019   Leg edema    Left leg cellulitis    Special screening for malignant neoplasms, colon    Polyp of sigmoid colon     Benign neoplasm of ascending colon    Rectal polyp    Herniated nucleus pulposus, L5-S1 11/09/2015   Low back pain 10/25/2015   Herniated nucleus pulposus, C5-6 right 10/06/2015   Dysesthesia 09/16/2015   Spasticity 09/16/2015   Unsteady gait 09/16/2015   Multiple sclerosis ([place]) 06/08/2011     REFERRING DIAG: R26.81 (ICD-10-CM) - Unsteadiness on feet   THERAPY DIAG:  Unsteadiness on feet  Difficulty in walking, not elsewhere classified  Abnormality of gait and mobility  Muscle weakness (generalized)  Rationale for Evaluation and Treatment Rehabilitation  PERTINENT HISTORY: Patient presents to physical therapy for unsteadiness, walking instability, fatigue. His PMH includes paroxysmal a fib, MS, lymphedema, hypothyroidism, Graves Disease, arthritis, lumbar herniated disc (L5), depression and neuropathy. Patient first developed symptoms of MS in 2005 and was diagnosed in 2006. Patient has done PT in the past, but hasn't been seen since 2020 at [facility]. Has not been doing his exercises in the past year. Walk outside to garage to ride lawnmower, walks in house. 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   PAIN:  Are you having pain? No  TODAY'S TREATMENT:  THEREX:   [employer] gym exercises:  Knee ext 12 reps at L2 each LE; 5 reps on left at L3 and 10 reps on R at L3; 8 reps at L2 Left and 8 reps at L4 Right LE; 6 reps at L2 Left LE and 6 reps at L5 Right LE  Knee flex 12 reps at L1 Left LE and L2 right LE; 10 reps at L2 LLE and L3 RLE; 8 reps at L3 LE and L4 RLE; 6 reps at L3 LE and L5 RLE   Leg press: Seat at level 12   Level 2 right LE x 12 reps; Left LE - level 2- 12 reps Level 3 RLE x10; LLE x10       Level 4 RLE x8; LLE x8           Level 5 right LE x 6 reps; Left LE- Level 5 - 6 reps    Calf press (Today pt requires some manual  assistance keeping LLE on pad):  Level 2 each LE x 12 reps each Level 3 each LE x 10 reps each Level 4 each LE x 8 reps each Level 5 each LE x 6 reps each   PATIENT EDUCATION: Education details: exercise technique, body mechanics Person educated: Patient Education method: Customer service manager, verbal cues Education comprehension: verbalized understanding, returned demonstration    HOME EXERCISE PROGRAM: No changes this session   PT Short Term Goals -       PT SHORT TERM GOAL #1   Title Patient will be independent in home exercise program to improve strength/mobility for better functional independence with ADLs.    Baseline 8/9: HEP to be given next session, 10/31: patient reports compliance with HEP and would like progressions added next session. 08/22/2021=Patient verbalized that he is walking and doing his LE exercises with no questions at this time.    Time 4    Period Weeks    Status Achieved    Target Date 08/16/21              PT Long Term Goals       PT LONG TERM GOAL #1   Title Patient will increase FOTO score to equal to or greater than 70%  to demonstrate statistically significant improvement in mobility and quality of life.    Baseline 8/9: 64%; risk adjusted 34%; 03/21/21 FOTO: 45; 04/24/21 FOTO: 49% ; 08/22/2021= 56% 4/20: 60% 11/28/21: 63%; 01/04/2022= 60%. 03/22/2022= 73%   Time 12    Period Weeks    Status Achieved   Target Date 06/26/2022     PT LONG TERM GOAL #2   Title Patient will increase Berg Balance score by > 6 points to demonstrate decreased fall risk during functional activities.    Baseline 4/20: 43/56 6/6: 48/56; 713/2023= 50/56   Time 12    Period Weeks    Status Achieved   Target Date 03/29/2022     PT LONG TERM GOAL #3   Title Patient will increase 10 meter walk test to >1.73ms as to improve gait speed for better community ambulation and to reduce fall risk.    Baseline 8/9: 0.65 m/s with rollator; 10/31: 0.68 m/s with rollator.  07/06/2021= 0.71 m/s using rollator; 08/22/2021= 0.94 m/s using rollator. 4/20: 1.1 m/s with rollator    Time 12    Period Weeks    Status Achieved    Target Date 10/11/21      PT LONG TERM GOAL #4  Title Patient will increase six minute walk test distance to >1000 for progression to community ambulator and improve gait ability    Baseline 8/9: 505 ft with rollator; 03/21/21: 759f c rollator, 04/24/21: 7556fc rollator; 07/06/2021=900 feet; 08/22/2021= 935 feet with use of Rollator 44/20: 940 ft 11/28/21: 1055 feet with rollator   Time 12    Period Weeks    Status Achieved    Target Date 01/04/22      PT LONG TERM GOAL #5   Title Patient will increase glute medius strength on Left LE from able to lift off 3" (sidelye on mat) to > 5" to improve stability, gait mechanics, and functional strength.    Baseline 10/31: 3-/5; 07/06/2021= 3-/5; 08/22/2021=3-/5 unable to achieve full ROM yet able to withstand some resistance with testing 4/20: unable to obtainfull ROM against gravity 6/6: unable to move against gravity; 01/04/2022= Continues to have difficulty - unable to raise left LE in sidelye against gravity but able to perform supine left Hip abd with some resistance= 3-/5; 03/22/2022- Patient able to raise his left LE 3 in off mat today. 04/03/2022= Patient able to raise left LE 3" off mat in sidelye today; 05/01/2022= 05/01/2022= patient able to raise left LE from sidelye position= 5"; 06/12/2022- Patient presents with 6.25 in sidelye Left hip abd against gravity. 06/26/2022= 7in in sidelye position.    Time 12    Period Weeks    Status MET   Target Date 06/26/2022     PT LONG TERM GOAL #6   Title Patient will increase Left single leg stance time to 15 seconds or greater to increase safety in shower and independence with ADLs.    Baseline 10/31; 1.83 sec without UE support; 08/22/2021=2-3  sec  on left; 6 sec on right 4/20: RLE 10 seconds LLE unable to perform 6/6: R LE 4 seconds during BERG; 01/04/2022= left  = 3 sec and right = 8 sec; 02/13/2022= 20 sec on right at best and 4 sec on left LE at best. 03/23/2022=Patient able to stand on left LE at best for 7 sec today. 04/03/2022= 27 sec on right and 12 sec on left; 06/12/2022= 13 sec on left  and 33 sec at best on right LE; 06/26/2021= 11 sec on left at best and 22 sec on right today; 07/26/2022= 10 sec on left and 20 sec on right today.    Time 12    Period Weeks    Status On-going    Target Date 09/18/2022     PT LONG TERM GOAL #7   Title Patient will safely negotiate 13 steps with handrails to safely enter/exit mothers house.    Baseline 4/20: very challenging 6/6: able to copmlete safely even managing dogs per pt report; 01/04/2022- Patient performed 16 steps with B Rail-no difficulty other than fatigue.    Time 12    Period Weeks    Status Achieved   Target Date 01/04/22    PT LONG TERM GOAL #8  Title Patient will increase six minute walk test distance to >1100 ft.  for progression to community ambulator and improve gait ability   Baseline 04/03/2022= 920 feet with 4WW - (Added another goal for endurance to continue to progress community amb distances); 05/03/2022= 960 feet with upright 4WW (patient's first time using device): 06/12/2022= 990 feet with 4WW; 06/26/2022= 1010 feet with 4WW; 07/26/2022= 1015 feet  Time 12   Period Weeks   Status Progressing  Target Date 09/18/2022  PT LONG TERM GOAL #9  Title Patient will be able to perform functional activities such as vacuuming or using backpack sprayer with modified independence for improved household chore abilities   Baseline 06/26/2022= Patient unable to perform either task but has strong desire to return to performing if able. 07/26/2022- Patient has not attempted to vacuum yet and PT session focusing on LE strengthening.   Time 12   Period Weeks   Status Ongoing  Target Date 09/18/2022   PT LONG TERM GOAL #10  Title Patient will increase glute medius strength on Left LE from able to lift  off 7" (sidelye on mat) to > 10" to improve Hip stability with standing, gait mechanics, and functional strength.   Baseline 06/26/2022= 7in in sidelye position. 07/26/2022- 7.5 in  Time 12   Period Weeks   Status ONGOING  Target Date 09/18/2022     Plan - 12/13/21 1340     Clinical Impression Statement Patient responded well to continued training with LE strengthening. He returned to practice some gym based exercises - initially struggling but did improve and no significant VC for LE press or calf press- some assist to keep Left LE on the foot plate. Patient was more fatigued at end of session with left LE but able to complete reps. Patient will continue to benefit from skilled PT services to continue to improve overall LE strength and functional mobility for optimal independent function.    Personal Factors and Comorbidities Age;Comorbidity 3+;Fitness;Past/Current Experience;Time since onset of injury/illness/exacerbation    Comorbidities aroxysmal a fib, MS, lymphedema, hypothyroidism, Graves Disease, arthritis, lumbar herniated disc (L5), depression and neuropathy    Examination-Activity Limitations Bed Mobility;Bend;Caring for Others;Carry;Reach Overhead;Locomotion Level;Lift;Hygiene/Grooming;Dressing;Squat;Stairs;Stand;Toileting;Transfers    Examination-Participation Restrictions Cleaning;Community Activity;Laundry;Occupation;Shop;Volunteer;Yard Work    Merchant navy officer Evolving/Moderate complexity    Rehab Potential Fair    PT Frequency 2x / week    PT Duration 12 weeks    PT Treatment/Interventions ADLs/Self Care Home Management;Aquatic Therapy;Canalith Repostioning;Biofeedback;Cryotherapy;Ultrasound;Traction;Moist Heat;Electrical Stimulation;DME Instruction;Gait training;Therapeutic exercise;Therapeutic activities;Functional mobility training;Stair training;Balance training;Neuromuscular re-education;Patient/family education;Manual techniques;Orthotic Fit/Training;Compression  bandaging;Passive range of motion;Vestibular;Taping;Splinting;Energy conservation;Dry needling;Visual/perceptual remediation/compensation    PT Next Visit Plan Continue with progessive balance training, Progressive LE strenthening as appropriate.    PT Home Exercise Plan Access Code: [email or web address] URL: [email or web address]  Added Bridges with BTB and encouraged more walking around the home during commerical breaks when watching TV.    Consulted and Agree with Plan of Care Patient            3:22 PM, 08/16/22 Ollen Bowl, PT Physical Therapist - [place] [phone]     08/16/22, 3:22 PM

## 2022-08-21 ENCOUNTER — Ambulatory Visit: Payer: Medicare PPO

## 2022-08-21 DIAGNOSIS — R269 Unspecified abnormalities of gait and mobility: Secondary | ICD-10-CM | POA: Diagnosis not present

## 2022-08-21 DIAGNOSIS — R2689 Other abnormalities of gait and mobility: Secondary | ICD-10-CM

## 2022-08-21 DIAGNOSIS — M6281 Muscle weakness (generalized): Secondary | ICD-10-CM

## 2022-08-21 DIAGNOSIS — R262 Difficulty in walking, not elsewhere classified: Secondary | ICD-10-CM | POA: Diagnosis not present

## 2022-08-21 DIAGNOSIS — R2681 Unsteadiness on feet: Secondary | ICD-10-CM

## 2022-08-21 DIAGNOSIS — R278 Other lack of coordination: Secondary | ICD-10-CM

## 2022-08-21 NOTE — Therapy (Signed)
OUTPATIENT PHYSICAL THERAPY TREATMENT NOTE   Patient Name: Dustin Gray MRN: [id number] DOB:07-Apr-1956, 67 y.o., male Today's Date: 08/22/2022  PCP: [name], MD REFERRING PROVIDER: [name], MD   PT End of Session - 08/21/22 1452     Visit Number [id number]    Number of Visits 126    Date for PT Re-Evaluation 09/18/22    Authorization Time Period 06/26/2022-09/18/2022    Progress Note Due on Visit 120    PT Start Time 1434    PT Stop Time 1514    PT Time Calculation (min) 40 min    Equipment Utilized During Treatment Gait belt    Activity Tolerance Patient tolerated treatment well    Behavior During Therapy WFL for tasks assessed/performed                      Past Medical History:  Diagnosis Date   Abscess    groin   Arthritis    lower spine   Erectile dysfunction    Low testosterone    Lumbar herniated disc    L5   Multiple sclerosis ([employer] [facility])    Staph aureus infection    Past Surgical History:  Procedure Laterality Date   CARDIOVERSION N/A 11/22/2021   Procedure: CARDIOVERSION;  Surgeon: [name], MD;  Location: [facility];  Service: Cardiovascular;  Laterality: N/A;   COLONOSCOPY WITH PROPOFOL N/A 07/04/2016   Procedure: COLONOSCOPY WITH PROPOFOL;  Surgeon: [name], MD;  Location: [place];  Service: Endoscopy;  Laterality: N/A;   [place]   POLYPECTOMY  07/04/2016   Procedure: POLYPECTOMY;  Surgeon: [name], MD;  Location: [place];  Service: Endoscopy;;   TONSILLECTOMY     Patient Active Problem List   Diagnosis Date Noted   Persistent atrial fibrillation ([place])    Graves disease    Abdominal bloating    Atrial fibrillation with RVR ([place]) 10/01/2019   Hyperthyroidism    Atrial fibrillation with rapid ventricular response ([place]) 09/30/2019   Leg edema    Left leg cellulitis    Special screening for malignant neoplasms, colon    Polyp of sigmoid colon     Benign neoplasm of ascending colon    Rectal polyp    Herniated nucleus pulposus, L5-S1 11/09/2015   Low back pain 10/25/2015   Herniated nucleus pulposus, C5-6 right 10/06/2015   Dysesthesia 09/16/2015   Spasticity 09/16/2015   Unsteady gait 09/16/2015   Multiple sclerosis ([place]) 06/08/2011     REFERRING DIAG: R26.81 (ICD-10-CM) - Unsteadiness on feet   THERAPY DIAG:  Unsteadiness on feet  Difficulty in walking, not elsewhere classified  Abnormality of gait and mobility  Muscle weakness (generalized)  Rationale for Evaluation and Treatment Rehabilitation  PERTINENT HISTORY: Patient presents to physical therapy for unsteadiness, walking instability, fatigue. His PMH includes paroxysmal a fib, MS, lymphedema, hypothyroidism, Graves Disease, arthritis, lumbar herniated disc (L5), depression and neuropathy. Patient first developed symptoms of MS in 2005 and was diagnosed in 2006. Patient has done PT in the past, but hasn't been seen since 2020 at [facility]. Has not been doing his exercises in the past year. Walk outside to garage to ride lawnmower, walks in house. Retired Automotive engineer. Has a rollator at home but doesn't use it in the house   PRECAUTIONS: Fall  SUBJECTIVE: Patient reports doing well today- denies any falls or pain  PAIN:  Are you having pain? No  TODAY'S TREATMENT:  THEREX:   Leg press: [employer]-    55# each LE x 3 sets of 10 ( min assist to keep left LE adducted to neutral) also assist to place left LE on foot plate   Resistive gait- 5# each LE x 600 feet using 4WW- VC very minimal to pick up left LE- Only with increased fatigue on last lap  Quadruped position- static hold x 1 min Quadruped posiiton- hip ext each LE x 12 reps - Mild difficulty stabilizing yet no report of pain and no LOB    PATIENT EDUCATION: Education details: exercise technique, body mechanics Person educated: Patient Education method: Customer service manager, verbal  cues Education comprehension: verbalized understanding, returned demonstration    HOME EXERCISE PROGRAM: No changes this session   PT Short Term Goals -       PT SHORT TERM GOAL #1   Title Patient will be independent in home exercise program to improve strength/mobility for better functional independence with ADLs.    Baseline 8/9: HEP to be given next session, 10/31: patient reports compliance with HEP and would like progressions added next session. 08/22/2021=Patient verbalized that he is walking and doing his LE exercises with no questions at this time.    Time 4    Period Weeks    Status Achieved    Target Date 08/16/21              PT Long Term Goals       PT LONG TERM GOAL #1   Title Patient will increase FOTO score to equal to or greater than 70%  to demonstrate statistically significant improvement in mobility and quality of life.    Baseline 8/9: 64%; risk adjusted 34%; 03/21/21 FOTO: 45; 04/24/21 FOTO: 49% ; 08/22/2021= 56% 4/20: 60% 11/28/21: 63%; 01/04/2022= 60%. 03/22/2022= 73%   Time 12    Period Weeks    Status Achieved   Target Date 06/26/2022     PT LONG TERM GOAL #2   Title Patient will increase Berg Balance score by > 6 points to demonstrate decreased fall risk during functional activities.    Baseline 4/20: 43/56 6/6: 48/56; 713/2023= 50/56   Time 12    Period Weeks    Status Achieved   Target Date 03/29/2022     PT LONG TERM GOAL #3   Title Patient will increase 10 meter walk test to >1.30ms as to improve gait speed for better community ambulation and to reduce fall risk.    Baseline 8/9: 0.65 m/s with rollator; 10/31: 0.68 m/s with rollator. 07/06/2021= 0.71 m/s using rollator; 08/22/2021= 0.94 m/s using rollator. 4/20: 1.1 m/s with rollator    Time 12    Period Weeks    Status Achieved    Target Date 10/11/21      PT LONG TERM GOAL #4   Title Patient will increase six minute walk test distance to >1000 for progression to community ambulator and improve  gait ability    Baseline 8/9: 505 ft with rollator; 03/21/21: [id number] rollator, 04/24/21: 75547f rollator; 07/06/2021=900 feet; 08/22/2021= 935 feet with use of Rollator 44/20: 940 ft 11/28/21: 1055 feet with rollator   Time 12    Period Weeks    Status Achieved    Target Date 01/04/22      PT LONG TERM GOAL #5   Title Patient will increase glute medius strength on Left LE from able to lift off 3" (sidelye on mat) to > 5" to improve  stability, gait mechanics, and functional strength.    Baseline 10/31: 3-/5; 07/06/2021= 3-/5; 08/22/2021=3-/5 unable to achieve full ROM yet able to withstand some resistance with testing 4/20: unable to obtainfull ROM against gravity 6/6: unable to move against gravity; 01/04/2022= Continues to have difficulty - unable to raise left LE in sidelye against gravity but able to perform supine left Hip abd with some resistance= 3-/5; 03/22/2022- Patient able to raise his left LE 3 in off mat today. 04/03/2022= Patient able to raise left LE 3" off mat in sidelye today; 05/01/2022= 05/01/2022= patient able to raise left LE from sidelye position= 5"; 06/12/2022- Patient presents with 6.25 in sidelye Left hip abd against gravity. 06/26/2022= 7in in sidelye position.    Time 12    Period Weeks    Status MET   Target Date 06/26/2022     PT LONG TERM GOAL #6   Title Patient will increase Left single leg stance time to 15 seconds or greater to increase safety in shower and independence with ADLs.    Baseline 10/31; 1.83 sec without UE support; 08/22/2021=2-3  sec  on left; 6 sec on right 4/20: RLE 10 seconds LLE unable to perform 6/6: R LE 4 seconds during BERG; 01/04/2022= left = 3 sec and right = 8 sec; 02/13/2022= 20 sec on right at best and 4 sec on left LE at best. 03/23/2022=Patient able to stand on left LE at best for 7 sec today. 04/03/2022= 27 sec on right and 12 sec on left; 06/12/2022= 13 sec on left  and 33 sec at best on right LE; 06/26/2021= 11 sec on left at best and 22 sec on right  today; 07/26/2022= 10 sec on left and 20 sec on right today.    Time 12    Period Weeks    Status On-going    Target Date 09/18/2022     PT LONG TERM GOAL #7   Title Patient will safely negotiate 13 steps with handrails to safely enter/exit mothers house.    Baseline 4/20: very challenging 6/6: able to copmlete safely even managing dogs per pt report; 01/04/2022- Patient performed 16 steps with B Rail-no difficulty other than fatigue.    Time 12    Period Weeks    Status Achieved   Target Date 01/04/22    PT LONG TERM GOAL #8  Title Patient will increase six minute walk test distance to >1100 ft.  for progression to community ambulator and improve gait ability   Baseline 04/03/2022= 920 feet with 4WW - (Added another goal for endurance to continue to progress community amb distances); 05/03/2022= 960 feet with upright 4WW (patient's first time using device): 06/12/2022= 990 feet with 4WW; 06/26/2022= 1010 feet with 4WW; 07/26/2022= 1015 feet  Time 12   Period Weeks   Status Progressing  Target Date 09/18/2022       PT LONG TERM GOAL #9  Title Patient will be able to perform functional activities such as vacuuming or using backpack sprayer with modified independence for improved household chore abilities   Baseline 06/26/2022= Patient unable to perform either task but has strong desire to return to performing if able. 07/26/2022- Patient has not attempted to vacuum yet and PT session focusing on LE strengthening.   Time 12   Period Weeks   Status Ongoing  Target Date 09/18/2022   PT LONG TERM GOAL #10  Title Patient will increase glute medius strength on Left LE from able to lift off 7" (sidelye on mat)  to > 10" to improve Hip stability with standing, gait mechanics, and functional strength.   Baseline 06/26/2022= 7in in sidelye position. 07/26/2022- 7.5 in  Time 12   Period Weeks   Status ONGOING  Target Date 09/18/2022     Plan - 12/13/21 1340     Clinical Impression Statement Treatment  limited secondary to patient requested to speak with OT regarding compression stocking for his lymphedema. He performed well overall during session- able to walk with increased resistance with only mild dragging of left LE at end of walk with increased fatigue. He required some assist to negotiate leg press effectively- no hyperextension exhibited today.  Patient will continue to benefit from skilled PT services to continue to improve overall LE strength and functional mobility for optimal independent function.    Personal Factors and Comorbidities Age;Comorbidity 3+;Fitness;Past/Current Experience;Time since onset of injury/illness/exacerbation    Comorbidities aroxysmal a fib, MS, lymphedema, hypothyroidism, Graves Disease, arthritis, lumbar herniated disc (L5), depression and neuropathy    Examination-Activity Limitations Bed Mobility;Bend;Caring for Others;Carry;Reach Overhead;Locomotion Level;Lift;Hygiene/Grooming;Dressing;Squat;Stairs;Stand;Toileting;Transfers    Examination-Participation Restrictions Cleaning;Community Activity;Laundry;Occupation;Shop;Volunteer;Yard Work    Merchant navy officer Evolving/Moderate complexity    Rehab Potential Fair    PT Frequency 2x / week    PT Duration 12 weeks    PT Treatment/Interventions ADLs/Self Care Home Management;Aquatic Therapy;Canalith Repostioning;Biofeedback;Cryotherapy;Ultrasound;Traction;Moist Heat;Electrical Stimulation;DME Instruction;Gait training;Therapeutic exercise;Therapeutic activities;Functional mobility training;Stair training;Balance training;Neuromuscular re-education;Patient/family education;Manual techniques;Orthotic Fit/Training;Compression bandaging;Passive range of motion;Vestibular;Taping;Splinting;Energy conservation;Dry needling;Visual/perceptual remediation/compensation    PT Next Visit Plan Continue with progessive balance training, Progressive LE strenthening as appropriate.    PT Home Exercise Plan Access Code:  [email or web address] URL: [email or web address]  Added Bridges with BTB and encouraged more walking around the home during commerical breaks when watching TV.    Consulted and Agree with Plan of Care Patient            8:09 AM, 08/22/22 Ollen Bowl, PT Physical Therapist - [place] [phone]     08/22/22, 8:09 AM

## 2022-08-23 ENCOUNTER — Ambulatory Visit: Payer: Medicare PPO

## 2022-08-27 NOTE — Therapy (Signed)
OUTPATIENT PHYSICAL THERAPY TREATMENT NOTE   Patient Name: Dustin Gray MRN: [id number] DOB:[birth year], 67 y.o., male Today's Date: 08/28/2022  PCP: [name], MD REFERRING PROVIDER: [name], MD   PT End of Session - 08/28/22 1438     Visit Number 118    Number of Visits 126    Date for PT Re-Evaluation 09/18/22    Authorization Time Period 06/26/2022-09/18/2022    Progress Note Due on Visit 120    PT Start Time 1432    Equipment Utilized During Treatment Gait belt    Activity Tolerance Patient tolerated treatment well    Behavior During Therapy WFL for tasks assessed/performed                       Past Medical History:  Diagnosis Date   Abscess    groin   Arthritis    lower spine   Erectile dysfunction    Low testosterone    Lumbar herniated disc    L5   Multiple sclerosis ([place])    Staph aureus infection    Past Surgical History:  Procedure Laterality Date   CARDIOVERSION N/A 11/22/2021   Procedure: CARDIOVERSION;  Surgeon: [name], MD;  Location: [facility];  Service: Cardiovascular;  Laterality: N/A;   COLONOSCOPY WITH PROPOFOL N/A 07/04/2016   Procedure: COLONOSCOPY WITH PROPOFOL;  Surgeon: [name], MD;  Location: [place];  Service: Endoscopy;  Laterality: N/A;   [place]   POLYPECTOMY  07/04/2016   Procedure: POLYPECTOMY;  Surgeon: [name], MD;  Location: [place];  Service: Endoscopy;;   TONSILLECTOMY     Patient Active Problem List   Diagnosis Date Noted   Persistent atrial fibrillation ([place])    Graves disease    Abdominal bloating    Atrial fibrillation with RVR ([place]) 10/01/2019   Hyperthyroidism    Atrial fibrillation with rapid ventricular response ([place]) 09/30/2019   Leg edema    Left leg cellulitis    Special screening for malignant neoplasms, colon    Polyp of sigmoid colon    Benign neoplasm of ascending colon    Rectal polyp     Herniated nucleus pulposus, L5-S1 11/09/2015   Low back pain 10/25/2015   Herniated nucleus pulposus, C5-6 right 10/06/2015   Dysesthesia 09/16/2015   Spasticity 09/16/2015   Unsteady gait 09/16/2015   Multiple sclerosis ([place]) 06/08/2011     REFERRING DIAG: R26.81 (ICD-10-CM) - Unsteadiness on feet   THERAPY DIAG:  Unsteadiness on feet  Difficulty in walking, not elsewhere classified  Abnormality of gait and mobility  Muscle weakness (generalized)  Rationale for Evaluation and Treatment Rehabilitation  PERTINENT HISTORY: Patient presents to physical therapy for unsteadiness, walking instability, fatigue. His PMH includes paroxysmal a fib, MS, lymphedema, hypothyroidism, Graves Disease, arthritis, lumbar herniated disc (L5), depression and neuropathy. Patient first developed symptoms of MS in 2005 and was diagnosed in 2006. Patient has done PT in the past, but hasn't been seen since 2020 at [facility]. Has not been doing his exercises in the past year. Walk outside to garage to ride lawnmower, walks in house. 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 No    TODAY'S TREATMENT:  THEREX:   Leg press: Precor-    25# each  LE x 3 sets of 10 ( min assist to keep left LE adducted to neutral) also assist to place left LE on foot plate   Sidelye position (on right) 2 sets x 10 reps -  L hip abd  Sidelye position (on right ) - 2 sets x 10 reps- clamshell- Left LE Supine SLR (each LE) - 2 sets x 10 reps  Supine bridge x 10 reps x 2 sets  Supine bridge with left LE straight x 10 reps   Wall slides - hold 15 sec x 4    PATIENT EDUCATION: Education details: exercise technique, body mechanics Person educated: Patient Education method: Customer service manager, verbal cues Education comprehension: verbalized  understanding, returned demonstration    HOME EXERCISE PROGRAM: No changes this session   PT Short Term Goals -       PT SHORT TERM GOAL #1   Title Patient will be independent in home exercise program to improve strength/mobility for better functional independence with ADLs.    Baseline 8/9: HEP to be given next session, 10/31: patient reports compliance with HEP and would like progressions added next session. 08/22/2021=Patient verbalized that he is walking and doing his LE exercises with no questions at this time.    Time 4    Period Weeks    Status Achieved    Target Date 08/16/21              PT Long Term Goals       PT LONG TERM GOAL #1   Title Patient will increase FOTO score to equal to or greater than 70%  to demonstrate statistically significant improvement in mobility and quality of life.    Baseline 8/9: 64%; risk adjusted 34%; 03/21/21 FOTO: 45; 04/24/21 FOTO: 49% ; 08/22/2021= 56% 4/20: 60% 11/28/21: 63%; 01/04/2022= 60%. 03/22/2022= 73%   Time 12    Period Weeks    Status Achieved   Target Date 06/26/2022     PT LONG TERM GOAL #2   Title Patient will increase Berg Balance score by > 6 points to demonstrate decreased fall risk during functional activities.    Baseline 4/20: 43/56 6/6: 48/56; 713/2023= 50/56   Time 12    Period Weeks    Status Achieved   Target Date 03/29/2022     PT LONG TERM GOAL #3   Title Patient will increase 10 meter walk test to >1.36ms as to improve gait speed for better community ambulation and to reduce fall risk.    Baseline 8/9: 0.65 m/s with rollator; 10/31: 0.68 m/s with rollator. 07/06/2021= 0.71 m/s using rollator; 08/22/2021= 0.94 m/s using rollator. 4/20: 1.1 m/s with rollator    Time 12    Period Weeks    Status Achieved    Target Date 10/11/21      PT LONG TERM GOAL #4   Title Patient will increase six minute walk test distance to >1000 for progression to community ambulator and improve gait ability    Baseline 8/9: 505 ft  with rollator; 03/21/21: [id number] rollator, 04/24/21: [id number] rollator; 07/06/2021=900 feet; 08/22/2021= 935 feet with use of Rollator 44/20: 940 ft 11/28/21: 1055 feet with rollator   Time 12    Period Weeks    Status Achieved    Target Date 01/04/22      PT LONG TERM GOAL #5   Title Patient will increase glute medius strength on Left LE from able to lift off 3" (sidelye on mat) to > 5" to improve  stability, gait mechanics, and functional strength.    Baseline 10/31: 3-/5; 07/06/2021= 3-/5; 08/22/2021=3-/5 unable to achieve full ROM yet able to withstand some resistance with testing 4/20: unable to obtainfull ROM against gravity 6/6: unable to move against gravity; 01/04/2022= Continues to have difficulty - unable to raise left LE in sidelye against gravity but able to perform supine left Hip abd with some resistance= 3-/5; 03/22/2022- Patient able to raise his left LE 3 in off mat today. 04/03/2022= Patient able to raise left LE 3" off mat in sidelye today; 05/01/2022= 05/01/2022= patient able to raise left LE from sidelye position= 5"; 06/12/2022- Patient presents with 6.25 in sidelye Left hip abd against gravity. 06/26/2022= 7in in sidelye position.    Time 12    Period Weeks    Status MET   Target Date 06/26/2022     PT LONG TERM GOAL #6   Title Patient will increase Left single leg stance time to 15 seconds or greater to increase safety in shower and independence with ADLs.    Baseline 10/31; 1.83 sec without UE support; 08/22/2021=2-3  sec  on left; 6 sec on right 4/20: RLE 10 seconds LLE unable to perform 6/6: R LE 4 seconds during BERG; 01/04/2022= left = 3 sec and right = 8 sec; 02/13/2022= 20 sec on right at best and 4 sec on left LE at best. 03/23/2022=Patient able to stand on left LE at best for 7 sec today. 04/03/2022= 27 sec on right and 12 sec on left; 06/12/2022= 13 sec on left  and 33 sec at best on right LE; 06/26/2021= 11 sec on left at best and 22 sec on right today; 07/26/2022= 10 sec on left and 20  sec on right today.    Time 12    Period Weeks    Status On-going    Target Date 09/18/2022     PT LONG TERM GOAL #7   Title Patient will safely negotiate 13 steps with handrails to safely enter/exit mothers house.    Baseline 4/20: very challenging 6/6: able to copmlete safely even managing dogs per pt report; 01/04/2022- Patient performed 16 steps with B Rail-no difficulty other than fatigue.    Time 12    Period Weeks    Status Achieved   Target Date 01/04/22    PT LONG TERM GOAL #8  Title Patient will increase six minute walk test distance to >1100 ft.  for progression to community ambulator and improve gait ability   Baseline 04/03/2022= 920 feet with 4WW - (Added another goal for endurance to continue to progress community amb distances); 05/03/2022= 960 feet with upright 4WW (patient's first time using device): 06/12/2022= 990 feet with 4WW; 06/26/2022= 1010 feet with 4WW; 07/26/2022= 1015 feet  Time 12   Period Weeks   Status Progressing  Target Date 09/18/2022       PT LONG TERM GOAL #9  Title Patient will be able to perform functional activities such as vacuuming or using backpack sprayer with modified independence for improved household chore abilities   Baseline 06/26/2022= Patient unable to perform either task but has strong desire to return to performing if able. 07/26/2022- Patient has not attempted to vacuum yet and PT session focusing on LE strengthening.   Time 12   Period Weeks   Status Ongoing  Target Date 09/18/2022   PT LONG TERM GOAL #10  Title Patient will increase glute medius strength on Left LE from able to lift off 7" (sidelye on mat)  to > 10" to improve Hip stability with standing, gait mechanics, and functional strength.   Baseline 06/26/2022= 7in in sidelye position. 07/26/2022- 7.5 in  Time 12   Period Weeks   Status ONGOING  Target Date 09/18/2022     Plan - 12/13/21 1340     Clinical Impression Statement Treatment continued to focus on LE strengthening  with patient demonstrating improved ability to use Left LE - less physical assist with all activities and improving sidelye hip abd.  Patient will continue to benefit from skilled PT services to continue to improve overall LE strength and functional mobility for optimal independent function.    Personal Factors and Comorbidities Age;Comorbidity 3+;Fitness;Past/Current Experience;Time since onset of injury/illness/exacerbation    Comorbidities aroxysmal a fib, MS, lymphedema, hypothyroidism, Graves Disease, arthritis, lumbar herniated disc (L5), depression and neuropathy    Examination-Activity Limitations Bed Mobility;Bend;Caring for Others;Carry;Reach Overhead;Locomotion Level;Lift;Hygiene/Grooming;Dressing;Squat;Stairs;Stand;Toileting;Transfers    Examination-Participation Restrictions Cleaning;Community Activity;Laundry;Occupation;Shop;Volunteer;Yard Work    [employer]chant [employer] officer Evolving/Moderate complexity    Rehab Potential Fair    PT Frequency 2x / week    PT Duration 12 weeks    PT Treatment/Interventions ADLs/Self Care Home Management;Aquatic Therapy;Canalith Repostioning;Biofeedback;Cryotherapy;Ultrasound;Traction;Moist Heat;Electrical Stimulation;DME Instruction;Gait training;Therapeutic exercise;Therapeutic activities;Functional mobility training;Stair training;Balance training;Neuromuscular re-education;Patient/family education;Manual techniques;Orthotic Fit/Training;Compression bandaging;Passive range of motion;Vestibular;Taping;Splinting;Energy conservation;Dry needling;Visual/perceptual remediation/compensation    PT Next Visit Plan Continue with progessive balance training, Progressive LE strenthening as appropriate.    PT Home Exercise Plan Access Code: [email or web address] URL: [email or web address]  Added Bridges with BTB and encouraged more walking around the home during commerical breaks when watching TV.    Consulted and Agree with Plan of Care Patient             2:50 PM, 08/28/22 Ollen Bowl, PT Physical Therapist - [place] [phone]     08/28/22, 2:50 PM

## 2022-08-28 ENCOUNTER — Ambulatory Visit: Payer: Medicare PPO | Attending: Neurology

## 2022-08-28 ENCOUNTER — Other Ambulatory Visit: Payer: Self-pay

## 2022-08-28 DIAGNOSIS — R262 Difficulty in walking, not elsewhere classified: Secondary | ICD-10-CM

## 2022-08-28 DIAGNOSIS — M6281 Muscle weakness (generalized): Secondary | ICD-10-CM | POA: Diagnosis not present

## 2022-08-28 DIAGNOSIS — R278 Other lack of coordination: Secondary | ICD-10-CM

## 2022-08-28 DIAGNOSIS — R2681 Unsteadiness on feet: Secondary | ICD-10-CM

## 2022-08-28 DIAGNOSIS — R2689 Other abnormalities of gait and mobility: Secondary | ICD-10-CM

## 2022-08-28 DIAGNOSIS — R269 Unspecified abnormalities of gait and mobility: Secondary | ICD-10-CM

## 2022-08-28 MED ORDER — PROPRANOLOL HCL ER 80 MG PO CP24: 80.0000 mg | ORAL_CAPSULE | Freq: Two times a day (BID) | ORAL | 0 refills | Status: DC

## 2022-08-30 ENCOUNTER — Ambulatory Visit: Payer: Medicare PPO

## 2022-08-30 DIAGNOSIS — R262 Difficulty in walking, not elsewhere classified: Secondary | ICD-10-CM

## 2022-08-30 DIAGNOSIS — R2689 Other abnormalities of gait and mobility: Secondary | ICD-10-CM | POA: Diagnosis not present

## 2022-08-30 DIAGNOSIS — R2681 Unsteadiness on feet: Secondary | ICD-10-CM | POA: Diagnosis not present

## 2022-08-30 DIAGNOSIS — M6281 Muscle weakness (generalized): Secondary | ICD-10-CM

## 2022-08-30 DIAGNOSIS — R269 Unspecified abnormalities of gait and mobility: Secondary | ICD-10-CM

## 2022-08-30 DIAGNOSIS — R278 Other lack of coordination: Secondary | ICD-10-CM | POA: Diagnosis not present

## 2022-08-30 NOTE — Therapy (Signed)
OUTPATIENT PHYSICAL THERAPY TREATMENT NOTE   Patient Name: Dustin Gray MRN: [id number] DOB:04-06-1956, 67 y.o., male Today's Date: 08/30/2022  PCP: [name], MD REFERRING PROVIDER: [name], MD   PT End of Session - 08/30/22 1445     Visit Number 119    Number of Visits 126    Date for PT Re-Evaluation 09/18/22    Authorization Type [employer] Auth 11/28/21-02/22/22 for 24 visits    Authorization Time Period 06/26/2022-09/18/2022    Progress Note Due on Visit 120    PT Start Time 1432    PT Stop Time 1512    PT Time Calculation (min) 40 min    Equipment Utilized During Treatment Gait belt    Activity Tolerance Patient tolerated treatment well;No increased pain    Behavior During Therapy WFL for tasks assessed/performed                Past Medical History:  Diagnosis Date   Abscess    groin   Arthritis    lower spine   Erectile dysfunction    Low testosterone    Lumbar herniated disc    L5   Multiple sclerosis ([place])    Staph aureus infection    Past Surgical History:  Procedure Laterality Date   CARDIOVERSION N/A 11/22/2021   Procedure: CARDIOVERSION;  Surgeon: [name], MD;  Location: [facility];  Service: Cardiovascular;  Laterality: N/A;   COLONOSCOPY WITH PROPOFOL N/A 07/04/2016   Procedure: COLONOSCOPY WITH PROPOFOL;  Surgeon: [name], MD;  Location: [place];  Service: Endoscopy;  Laterality: N/A;   [place]   POLYPECTOMY  07/04/2016   Procedure: POLYPECTOMY;  Surgeon: [name], MD;  Location: [place];  Service: Endoscopy;;   TONSILLECTOMY     Patient Active Problem List   Diagnosis Date Noted   Persistent atrial fibrillation ([place])    Graves disease    Abdominal bloating    Atrial fibrillation with RVR ([place]) 10/01/2019   Hyperthyroidism    Atrial fibrillation with rapid ventricular response ([place]) 09/30/2019   Leg edema    Left leg cellulitis    Special  screening for malignant neoplasms, colon    Polyp of sigmoid colon    Benign neoplasm of ascending colon    Rectal polyp    Herniated nucleus pulposus, L5-S1 11/09/2015   Low back pain 10/25/2015   Herniated nucleus pulposus, C5-6 right 10/06/2015   Dysesthesia 09/16/2015   Spasticity 09/16/2015   Unsteady gait 09/16/2015   Multiple sclerosis ([place]) 06/08/2011     REFERRING DIAG: R26.81 (ICD-10-CM) - Unsteadiness on feet   THERAPY DIAG:  Unsteadiness on feet  Difficulty in walking, not elsewhere classified  Abnormality of gait and mobility  Muscle weakness (generalized)  Rationale for Evaluation and Treatment Rehabilitation  PERTINENT HISTORY: Patient presents to physical therapy for unsteadiness, walking instability, fatigue. His PMH includes paroxysmal a fib, MS, lymphedema, hypothyroidism, Graves Disease, arthritis, lumbar herniated disc (L5), depression and neuropathy. Patient first developed symptoms of MS in 2005 and was diagnosed in 2006. Patient has done PT in the past, but hasn't been seen since 2020 at [facility]. Has not been doing his exercises in the past year. Walk outside to garage to ride lawnmower, walks in house. Retired Automotive engineer. Has a rollator at home but doesn't use it in the house   PRECAUTIONS: Fall  SUBJECTIVE: Pt doing ok today, back pain still aggravated a little.  PAIN:  Are you having pain? Back pain aggravated: 3/10    TODAY'S TREATMENT:  THEREX:  Leg press: Precor-   -25# each LE x 3 sets of 10 ( min assist to keep left LE adducted to neutral) also assist to place left LE on foot plate -hooklying bridge 1x15  -hooklying marching 10x1secH  -hooklying bridge 1x15  -hooklying marching 10x1secH   -hooklying to Rt EOB, to hooklying to Left EOB to hooklying to Rt EOB to hooklying to Left EOB (authro provides minA of LLE)    PATIENT EDUCATION: Education details: exercise technique, body mechanics Person educated: Patient Education  method: Customer service manager, verbal cues Education comprehension: verbalized understanding, returned demonstration    HOME EXERCISE PROGRAM: No changes this session   PT Short Term Goals -       PT SHORT TERM GOAL #1   Title Patient will be independent in home exercise program to improve strength/mobility for better functional independence with ADLs.    Baseline 8/9: HEP to be given next session, 10/31: patient reports compliance with HEP and would like progressions added next session. 08/22/2021=Patient verbalized that he is walking and doing his LE exercises with no questions at this time.    Time 4    Period Weeks    Status Achieved    Target Date 08/16/21              PT Long Term Goals       PT LONG TERM GOAL #1   Title Patient will increase FOTO score to equal to or greater than 70%  to demonstrate statistically significant improvement in mobility and quality of life.    Baseline 8/9: 64%; risk adjusted 34%; 03/21/21 FOTO: 45; 04/24/21 FOTO: 49% ; 08/22/2021= 56% 4/20: 60% 11/28/21: 63%; 01/04/2022= 60%. 03/22/2022= 73%   Time 12    Period Weeks    Status Achieved   Target Date 06/26/2022     PT LONG TERM GOAL #2   Title Patient will increase Berg Balance score by > 6 points to demonstrate decreased fall risk during functional activities.    Baseline 4/20: 43/56 6/6: 48/56; 713/2023= 50/56   Time 12    Period Weeks    Status Achieved   Target Date 03/29/2022     PT LONG TERM GOAL #3   Title Patient will increase 10 meter walk test to >1.71ms as to improve gait speed for better community ambulation and to reduce fall risk.    Baseline 8/9: 0.65 m/s with rollator; 10/31: 0.68 m/s with rollator. 07/06/2021= 0.71 m/s using rollator; 08/22/2021= 0.94 m/s using rollator. 4/20: 1.1 m/s with rollator    Time 12    Period Weeks    Status Achieved    Target Date 10/11/21      PT LONG TERM GOAL #4   Title Patient will increase six minute walk test distance to >1000 for  progression to community ambulator and improve gait ability    Baseline 8/9: 505 ft with rollator; 03/21/21: [id number] rollator, 04/24/21: [id number] rollator; 07/06/2021=900 feet; 08/22/2021= 935 feet with use of Rollator 44/20: 940 ft 11/28/21: 1055 feet with rollator   Time 12    Period Weeks    Status Achieved    Target Date 01/04/22      PT LONG TERM GOAL #5   Title Patient will increase glute medius strength on Left LE from able to lift off 3" (sidelye on mat) to > 5" to improve stability, gait mechanics, and  functional strength.    Baseline 10/31: 3-/5; 07/06/2021= 3-/5; 08/22/2021=3-/5 unable to achieve full ROM yet able to withstand some resistance with testing 4/20: unable to obtainfull ROM against gravity 6/6: unable to move against gravity; 01/04/2022= Continues to have difficulty - unable to raise left LE in sidelye against gravity but able to perform supine left Hip abd with some resistance= 3-/5; 03/22/2022- Patient able to raise his left LE 3 in off mat today. 04/03/2022= Patient able to raise left LE 3" off mat in sidelye today; 05/01/2022= 05/01/2022= patient able to raise left LE from sidelye position= 5"; 06/12/2022- Patient presents with 6.25 in sidelye Left hip abd against gravity. 06/26/2022= 7in in sidelye position.    Time 12    Period Weeks    Status MET   Target Date 06/26/2022     PT LONG TERM GOAL #6   Title Patient will increase Left single leg stance time to 15 seconds or greater to increase safety in shower and independence with ADLs.    Baseline 10/31; 1.83 sec without UE support; 08/22/2021=2-3  sec  on left; 6 sec on right 4/20: RLE 10 seconds LLE unable to perform 6/6: R LE 4 seconds during BERG; 01/04/2022= left = 3 sec and right = 8 sec; 02/13/2022= 20 sec on right at best and 4 sec on left LE at best. 03/23/2022=Patient able to stand on left LE at best for 7 sec today. 04/03/2022= 27 sec on right and 12 sec on left; 06/12/2022= 13 sec on left  and 33 sec at best on right LE;  06/26/2021= 11 sec on left at best and 22 sec on right today; 07/26/2022= 10 sec on left and 20 sec on right today.    Time 12    Period Weeks    Status On-going    Target Date 09/18/2022     PT LONG TERM GOAL #7   Title Patient will safely negotiate 13 steps with handrails to safely enter/exit mothers house.    Baseline 4/20: very challenging 6/6: able to copmlete safely even managing dogs per pt report; 01/04/2022- Patient performed 16 steps with B Rail-no difficulty other than fatigue.    Time 12    Period Weeks    Status Achieved   Target Date 01/04/22    PT LONG TERM GOAL #8  Title Patient will increase six minute walk test distance to >1100 ft.  for progression to community ambulator and improve gait ability   Baseline 04/03/2022= 920 feet with 4WW - (Added another goal for endurance to continue to progress community amb distances); 05/03/2022= 960 feet with upright 4WW (patient's first time using device): 06/12/2022= 990 feet with 4WW; 06/26/2022= 1010 feet with 4WW; 07/26/2022= 1015 feet  Time 12   Period Weeks   Status Progressing  Target Date 09/18/2022       PT LONG TERM GOAL #9  Title Patient will be able to perform functional activities such as vacuuming or using backpack sprayer with modified independence for improved household chore abilities   Baseline 06/26/2022= Patient unable to perform either task but has strong desire to return to performing if able. 07/26/2022- Patient has not attempted to vacuum yet and PT session focusing on LE strengthening.   Time 12   Period Weeks   Status Ongoing  Target Date 09/18/2022   PT LONG TERM GOAL #10  Title Patient will increase glute medius strength on Left LE from able to lift off 7" (sidelye on mat) to > 10" to  improve Hip stability with standing, gait mechanics, and functional strength.   Baseline 06/26/2022= 7in in sidelye position. 07/26/2022- 7.5 in  Time 12   Period Weeks   Status ONGOING  Target Date 09/18/2022     Plan - 12/13/21  1340     Clinical Impression Statement Continued with current plan of care as laid out in evaluation and recent prior sessions. All interventions tolerated as expected by author. Mobility and strength continue to progress as anticipated. Recovery intervals given as needed based on signs of exertion and/or pt request. Pt educated on best technique for each intervention- author uses verbal, visual, tactile cues to optimize learning. Author takes steps to maximize patient independence when appropriate. Pt remains highly motivated. Pt closely monitored throughout session for safe activity response, as well as to maximize patient safety during interventions. The patient's therapy prognosis indicates continued potential for improvement, anticipate that future progress is attainable in a reasonable/predictable timeframe. Maximum improvement is within reach. Pt will continue to benefit from skilled PT services to address deficits and impairment identified in evaluation in order to maximize independence and safety in basic mobility required for performance of ADL, IADL, and leisure.    Personal Factors and Comorbidities Age;Comorbidity 3+;Fitness;Past/Current Experience;Time since onset of injury/illness/exacerbation    Comorbidities aroxysmal a fib, MS, lymphedema, hypothyroidism, Graves Disease, arthritis, lumbar herniated disc (L5), depression and neuropathy    Examination-Activity Limitations Bed Mobility;Bend;Caring for Others;Carry;Reach Overhead;Locomotion Level;Lift;Hygiene/Grooming;Dressing;Squat;Stairs;Stand;Toileting;Transfers    Examination-Participation Restrictions Cleaning;Community Activity;Laundry;Occupation;Shop;Volunteer;Yard Work    [employer] officer Evolving/Moderate complexity    Rehab Potential Fair    PT Frequency 2x / week    PT Duration 12 weeks    PT Treatment/Interventions ADLs/Self Care Home Management;Aquatic Therapy;Canalith  Repostioning;Biofeedback;Cryotherapy;Ultrasound;Traction;Moist Heat;Electrical Stimulation;DME Instruction;Gait training;Therapeutic exercise;Therapeutic activities;Functional mobility training;Stair training;Balance training;Neuromuscular re-education;Patient/family education;Manual techniques;Orthotic Fit/Training;Compression bandaging;Passive range of motion;Vestibular;Taping;Splinting;Energy conservation;Dry needling;Visual/perceptual remediation/compensation    PT Next Visit Plan Continue with progessive balance training, Progressive LE strenthening as appropriate.    PT Home Exercise Plan Access Code: [email or web address] URL: [email or web address]  Added Bridges with BTB and encouraged more walking around the home during commerical breaks when watching TV.    Consulted and Agree with Plan of Care Patient            2:49 PM, 08/30/22  08/30/22, 2:49 PM  2:49 PM, 08/30/22 Etta Grandchild, PT, DPT Physical Therapist - [facility]  [phone] [facility])

## 2022-09-04 ENCOUNTER — Ambulatory Visit: Payer: Medicare PPO

## 2022-09-04 DIAGNOSIS — R262 Difficulty in walking, not elsewhere classified: Secondary | ICD-10-CM | POA: Diagnosis not present

## 2022-09-04 DIAGNOSIS — R2689 Other abnormalities of gait and mobility: Secondary | ICD-10-CM | POA: Diagnosis not present

## 2022-09-04 DIAGNOSIS — R269 Unspecified abnormalities of gait and mobility: Secondary | ICD-10-CM | POA: Diagnosis not present

## 2022-09-04 DIAGNOSIS — R278 Other lack of coordination: Secondary | ICD-10-CM | POA: Diagnosis not present

## 2022-09-04 DIAGNOSIS — M6281 Muscle weakness (generalized): Secondary | ICD-10-CM

## 2022-09-04 DIAGNOSIS — R2681 Unsteadiness on feet: Secondary | ICD-10-CM | POA: Diagnosis not present

## 2022-09-04 NOTE — Therapy (Signed)
OUTPATIENT PHYSICAL THERAPY TREATMENT NOTE/PROGRESS NOTE Dates of Reporting Period: 07/26/22 - 09/04/22   Patient Name: Dustin Gray MRN: [id number] DOB:June 06, 1956, 67 y.o., male Today's Date: 09/04/2022  PCP: [name], MD REFERRING PROVIDER: [name], MD   PT End of Session - 09/04/22 1305     Visit Number 120    Number of Visits 126    Date for PT Re-Evaluation 09/18/22    Authorization Type [employer] Auth 11/28/21-02/22/22 for 24 visits    Authorization Time Period 06/26/2022-09/18/2022    Progress Note Due on Visit 120    PT Start Time 1302    PT Stop Time 1345    PT Time Calculation (min) 43 min    Equipment Utilized During Treatment Gait belt    Activity Tolerance Patient tolerated treatment well;No increased pain    Behavior During Therapy WFL for tasks assessed/performed                Past Medical History:  Diagnosis Date   Abscess    groin   Arthritis    lower spine   Erectile dysfunction    Low testosterone    Lumbar herniated disc    L5   Multiple sclerosis ([place])    Staph aureus infection    Past Surgical History:  Procedure Laterality Date   CARDIOVERSION N/A 11/22/2021   Procedure: CARDIOVERSION;  Surgeon: [name], MD;  Location: [facility];  Service: Cardiovascular;  Laterality: N/A;   COLONOSCOPY WITH PROPOFOL N/A 07/04/2016   Procedure: COLONOSCOPY WITH PROPOFOL;  Surgeon: [name], MD;  Location: [place];  Service: Endoscopy;  Laterality: N/A;   [place]   POLYPECTOMY  07/04/2016   Procedure: POLYPECTOMY;  Surgeon: [name], MD;  Location: [place];  Service: Endoscopy;;   TONSILLECTOMY     Patient Active Problem List   Diagnosis Date Noted   Persistent atrial fibrillation ([place])    Graves disease    Abdominal bloating    Atrial fibrillation with RVR ([place]) 10/01/2019   Hyperthyroidism    Atrial fibrillation with rapid ventricular response ([place])  09/30/2019   Leg edema    Left leg cellulitis    Special screening for malignant neoplasms, colon    Polyp of sigmoid colon    Benign neoplasm of ascending colon    Rectal polyp    Herniated nucleus pulposus, L5-S1 11/09/2015   Low back pain 10/25/2015   Herniated nucleus pulposus, C5-6 right 10/06/2015   Dysesthesia 09/16/2015   Spasticity 09/16/2015   Unsteady gait 09/16/2015   Multiple sclerosis ([place]) 06/08/2011     REFERRING DIAG: R26.81 (ICD-10-CM) - Unsteadiness on feet   THERAPY DIAG:  Unsteadiness on feet  Difficulty in walking, not elsewhere classified  Abnormality of gait and mobility  Muscle weakness (generalized)  Rationale for Evaluation and Treatment Rehabilitation  PERTINENT HISTORY: Patient presents to physical therapy for unsteadiness, walking instability, fatigue. His PMH includes paroxysmal a fib, MS, lymphedema, hypothyroidism, Graves Disease, arthritis, lumbar herniated disc (L5), depression and neuropathy. Patient first developed symptoms of MS in 2005 and was diagnosed in 2006. Patient has done PT in the past, but hasn't been seen since 2020 at [facility]. Has not been doing his exercises in the past year. Walk outside to garage to ride lawnmower, walks in house. Retired Automotive engineer. Has a rollator at home but doesn't use it in the house   PRECAUTIONS: Fall  SUBJECTIVE: Pt without concerns. Got  to go to the beach this weekend to visit his mother and is feeling fatigued. No pain today.   PAIN:  Are you having pain? Back pain none currently    TODAY'S TREATMENT:  THEREX:  Reviewed remaining goals. See goals section for details.   R side lying L hip abduction: 3x6. Quick to fatigue, needing max TC's on pelvis and/or LE for form.   Hook lying alternating marches: 2x10. First set no resistance. Second set with 2# AW's.   Leg press: LLE 3x10, 25#. Assist with LLE placement and maintaining LLE in neutral hip alignment.    PATIENT  EDUCATION: Education details: exercise technique, body mechanics Person educated: Patient Education method: Customer service manager, verbal cues Education comprehension: verbalized understanding, returned demonstration    HOME EXERCISE PROGRAM: No changes this session   PT Short Term Goals -       PT SHORT TERM GOAL #1   Title Patient will be independent in home exercise program to improve strength/mobility for better functional independence with ADLs.    Baseline 8/9: HEP to be given next session, 10/31: patient reports compliance with HEP and would like progressions added next session. 08/22/2021=Patient verbalized that he is walking and doing his LE exercises with no questions at this time.    Time 4    Period Weeks    Status Achieved    Target Date 08/16/21              PT Long Term Goals       PT LONG TERM GOAL #1   Title Patient will increase FOTO score to equal to or greater than 70%  to demonstrate statistically significant improvement in mobility and quality of life.    Baseline 8/9: 64%; risk adjusted 34%; 03/21/21 FOTO: 45; 04/24/21 FOTO: 49% ; 08/22/2021= 56% 4/20: 60% 11/28/21: 63%; 01/04/2022= 60%. 03/22/2022= 73%   Time 12    Period Weeks    Status Achieved   Target Date 06/26/2022     PT LONG TERM GOAL #2   Title Patient will increase Berg Balance score by > 6 points to demonstrate decreased fall risk during functional activities.    Baseline 4/20: 43/56 6/6: 48/56; 713/2023= 50/56   Time 12    Period Weeks    Status Achieved   Target Date 03/29/2022     PT LONG TERM GOAL #3   Title Patient will increase 10 meter walk test to >1.48ms as to improve gait speed for better community ambulation and to reduce fall risk.    Baseline 8/9: 0.65 m/s with rollator; 10/31: 0.68 m/s with rollator. 07/06/2021= 0.71 m/s using rollator; 08/22/2021= 0.94 m/s using rollator. 4/20: 1.1 m/s with rollator    Time 12    Period Weeks    Status Achieved    Target Date 10/11/21       PT LONG TERM GOAL #4   Title Patient will increase six minute walk test distance to >1000 for progression to community ambulator and improve gait ability    Baseline 8/9: 505 ft with rollator; 03/21/21: [id number] rollator, 04/24/21: [id number] rollator; 07/06/2021=900 feet; 08/22/2021= 935 feet with use of Rollator 44/20: 940 ft 11/28/21: 1055 feet with rollator   Time 12    Period Weeks    Status Achieved    Target Date 01/04/22      PT LONG TERM GOAL #5   Title Patient will increase glute medius strength on Left LE from able to lift off 3" (sidelye on  mat) to > 5" to improve stability, gait mechanics, and functional strength.    Baseline 10/31: 3-/5; 07/06/2021= 3-/5; 08/22/2021=3-/5 unable to achieve full ROM yet able to withstand some resistance with testing 4/20: unable to obtainfull ROM against gravity 6/6: unable to move against gravity; 01/04/2022= Continues to have difficulty - unable to raise left LE in sidelye against gravity but able to perform supine left Hip abd with some resistance= 3-/5; 03/22/2022- Patient able to raise his left LE 3 in off mat today. 04/03/2022= Patient able to raise left LE 3" off mat in sidelye today; 05/01/2022= 05/01/2022= patient able to raise left LE from sidelye position= 5"; 06/12/2022- Patient presents with 6.25 in sidelye Left hip abd against gravity. 06/26/2022= 7in in sidelye position.    Time 12    Period Weeks    Status MET   Target Date 06/26/2022     PT LONG TERM GOAL #6   Title Patient will increase Left single leg stance time to 15 seconds or greater to increase safety in shower and independence with ADLs.    Baseline 10/31; 1.83 sec without UE support; 08/22/2021=2-3  sec  on left; 6 sec on right 4/20: RLE 10 seconds LLE unable to perform 6/6: R LE 4 seconds during BERG; 01/04/2022= left = 3 sec and right = 8 sec; 02/13/2022= 20 sec on right at best and 4 sec on left LE at best. 03/23/2022=Patient able to stand on left LE at best for 7 sec today. 04/03/2022= 27  sec on right and 12 sec on left; 06/12/2022= 13 sec on left  and 33 sec at best on right LE; 06/26/2021= 11 sec on left at best and 22 sec on right today; 07/26/2022= 10 sec on left and 20 sec on right today.; 09/04/22: 1-2 seconds on LLE. 27 sec on RLE.    Time 12    Period Weeks    Status On-going    Target Date 09/18/2022     PT LONG TERM GOAL #7   Title Patient will safely negotiate 13 steps with handrails to safely enter/exit mothers house.    Baseline 4/20: very challenging 6/6: able to copmlete safely even managing dogs per pt report; 01/04/2022- Patient performed 16 steps with B Rail-no difficulty other than fatigue.    Time 12    Period Weeks    Status Achieved   Target Date 01/04/22    PT LONG TERM GOAL #8  Title Patient will increase six minute walk test distance to >1100 ft.  for progression to community ambulator and improve gait ability   Baseline 04/03/2022= 920 feet with 4WW - (Added another goal for endurance to continue to progress community amb distances); 05/03/2022= 960 feet with upright 4WW (patient's first time using device): 06/12/2022= 990 feet with 4WW; 06/26/2022= 1010 feet with 4WW; 07/26/2022= 1015 feet; 09/04/22: 1,020'  Time 12   Period Weeks   Status Progressing  Target Date 09/18/2022       PT LONG TERM GOAL #9  Title Patient will be able to perform functional activities such as vacuuming or using backpack sprayer with modified independence for improved household chore abilities   Baseline 06/26/2022= Patient unable to perform either task but has strong desire to return to performing if able. 07/26/2022- Patient has not attempted to vacuum yet and PT session focusing on LE strengthening.; 09/04/22: Able to perform vacuuming tasks mod-I. Has to break up tasks due to limited standing tolerance. Has not done outdoor tasks due to weather.  Time 12   Period Weeks   Status Ongoing  Target Date 09/18/2022   PT LONG TERM GOAL #10  Title Patient will increase glute medius  strength on Left LE from able to lift off 7" (sidelye on mat) to > 10" to improve Hip stability with standing, gait mechanics, and functional strength.   Baseline 06/26/2022= 7in in sidelye position. 07/26/2022- 7.5 in; 09/04/22: 8"  Time 12   Period Weeks   Status ONGOING  Target Date 09/18/2022     Plan - 12/13/21 1340     Clinical Impression Statement Pt on visit 120 requiring progress note. Pt with regression in LLE SLS time with inability to hold > 1-2 sec. Prior sessions pt able to maintain for 10-12 seconds. Pt contributes it to fatigue from recent travel fatigue. Pt did make progress in his 6MWT by  despite LE fatigue, and also reports improved ability to complete ADL's in the household but relies on spreading tasks out due to limited standing endurance. Patient's condition has the potential to improve in response to therapy. Maximum improvement is yet to be obtained. The anticipated improvement is attainable and reasonable in a generally predictable time. Pt will continue to benefit from skilled PT services to address remaining deficits as pt getting close to end of his current POC.     Personal Factors and Comorbidities Age;Comorbidity 3+;Fitness;Past/Current Experience;Time since onset of injury/illness/exacerbation    Comorbidities aroxysmal a fib, MS, lymphedema, hypothyroidism, Graves Disease, arthritis, lumbar herniated disc (L5), depression and neuropathy    Examination-Activity Limitations Bed Mobility;Bend;Caring for Others;Carry;Reach Overhead;Locomotion Level;Lift;Hygiene/Grooming;Dressing;Squat;Stairs;Stand;Toileting;Transfers    Examination-Participation Restrictions Cleaning;Community Activity;Laundry;Occupation;Shop;Volunteer;Yard Work    Merchant [employer] officer Evolving/Moderate complexity    Rehab Potential Fair    PT Frequency 2x / week    PT Duration 12 weeks    PT Treatment/Interventions ADLs/Self Care Home Management;Aquatic Therapy;Canalith  Repostioning;Biofeedback;Cryotherapy;Ultrasound;Traction;Moist Heat;Electrical Stimulation;DME Instruction;Gait training;Therapeutic exercise;Therapeutic activities;Functional mobility training;Stair training;Balance training;Neuromuscular re-education;Patient/family education;Manual techniques;Orthotic Fit/Training;Compression bandaging;Passive range of motion;Vestibular;Taping;Splinting;Energy conservation;Dry needling;Visual/perceptual remediation/compensation    PT Next Visit Plan Continue with progessive balance training, Progressive LE strenthening as appropriate.    PT Home Exercise Plan Access Code: [email or web address] URL: [email or web address]  Added Bridges with BTB and encouraged more walking around the home during commerical breaks when watching TV.    Consulted and Agree with Plan of Care Patient            Salem Caster. Fairly IV, PT, DPT Physical Therapist- [facility]

## 2022-09-06 ENCOUNTER — Ambulatory Visit: Payer: Medicare PPO

## 2022-09-06 DIAGNOSIS — M6281 Muscle weakness (generalized): Secondary | ICD-10-CM | POA: Diagnosis not present

## 2022-09-06 DIAGNOSIS — R278 Other lack of coordination: Secondary | ICD-10-CM | POA: Diagnosis not present

## 2022-09-06 DIAGNOSIS — R262 Difficulty in walking, not elsewhere classified: Secondary | ICD-10-CM

## 2022-09-06 DIAGNOSIS — R2689 Other abnormalities of gait and mobility: Secondary | ICD-10-CM

## 2022-09-06 DIAGNOSIS — R269 Unspecified abnormalities of gait and mobility: Secondary | ICD-10-CM

## 2022-09-06 DIAGNOSIS — R2681 Unsteadiness on feet: Secondary | ICD-10-CM | POA: Diagnosis not present

## 2022-09-06 NOTE — Therapy (Signed)
OUTPATIENT PHYSICAL THERAPY TREATMENT NOTE   Patient Name: Dustin Gray MRN: [id number] DOB:10-01-1955, 67 y.o., male Today's Date: 09/07/2022  PCP: [name], MD REFERRING PROVIDER: [name], MD   PT End of Session - 09/06/22 1438     Visit Number 121    Number of Visits 126    Date for PT Re-Evaluation 09/18/22    Authorization Type [employer] Auth 11/28/21-02/22/22 for 24 visits    Authorization Time Period 06/26/2022-09/18/2022    Progress Note Due on Visit 130    PT Start Time 1430    PT Stop Time 1514    PT Time Calculation (min) 44 min    Equipment Utilized During Treatment Gait belt    Activity Tolerance Patient tolerated treatment well;No increased pain    Behavior During Therapy WFL for tasks assessed/performed                Past Medical History:  Diagnosis Date   Abscess    groin   Arthritis    lower spine   Erectile dysfunction    Low testosterone    Lumbar herniated disc    L5   Multiple sclerosis ([place])    Staph aureus infection    Past Surgical History:  Procedure Laterality Date   CARDIOVERSION N/A 11/22/2021   Procedure: CARDIOVERSION;  Surgeon: [name], MD;  Location: [facility];  Service: Cardiovascular;  Laterality: N/A;   COLONOSCOPY WITH PROPOFOL N/A 07/04/2016   Procedure: COLONOSCOPY WITH PROPOFOL;  Surgeon: [name], MD;  Location: [place];  Service: Endoscopy;  Laterality: N/A;   [place]   POLYPECTOMY  07/04/2016   Procedure: POLYPECTOMY;  Surgeon: [name], MD;  Location: [place];  Service: Endoscopy;;   TONSILLECTOMY     Patient Active Problem List   Diagnosis Date Noted   Persistent atrial fibrillation ([place])    Graves disease    Abdominal bloating    Atrial fibrillation with RVR ([place]) 10/01/2019   Hyperthyroidism    Atrial fibrillation with rapid ventricular response ([place]) 09/30/2019   Leg edema    Left leg cellulitis    Special  screening for malignant neoplasms, colon    Polyp of sigmoid colon    Benign neoplasm of ascending colon    Rectal polyp    Herniated nucleus pulposus, L5-S1 11/09/2015   Low back pain 10/25/2015   Herniated nucleus pulposus, C5-6 right 10/06/2015   Dysesthesia 09/16/2015   Spasticity 09/16/2015   Unsteady gait 09/16/2015   Multiple sclerosis ([place]) 06/08/2011     REFERRING DIAG: R26.81 (ICD-10-CM) - Unsteadiness on feet   THERAPY DIAG:  Unsteadiness on feet  Difficulty in walking, not elsewhere classified  Abnormality of gait and mobility  Muscle weakness (generalized)  Rationale for Evaluation and Treatment Rehabilitation  PERTINENT HISTORY: Patient presents to physical therapy for unsteadiness, walking instability, fatigue. His PMH includes paroxysmal a fib, MS, lymphedema, hypothyroidism, Graves Disease, arthritis, lumbar herniated disc (L5), depression and neuropathy. Patient first developed symptoms of MS in 2005 and was diagnosed in 2006. Patient has done PT in the past, but hasn't been seen since 2020 at [facility]. Has not been doing his exercises in the past year. Walk outside to garage to ride lawnmower, walks in house. Retired Automotive engineer. Has a rollator at home but doesn't use it in the house   PRECAUTIONS: Fall  SUBJECTIVE: Pt reports legs is heavy and tired. Reports he was probably more  [place] and unable to elevate his leg as much.   PAIN:  Are you having pain? Back pain none currently    TODAY'S TREATMENT:  THEREX:  Seated knee ext using matrix cable system 7.5# 3 sets of 12 reps   Seated knee flex using matrix cable system 7.5 # (left LE - 1 set of 12 reps; 2 sets x 10 reps; R LE- 3 sets of 12 reps)   Standing hip abd using 7.5# resistance- 3 sets of 12 reps each LE    PATIENT EDUCATION: Education details: exercise technique, body mechanics Person educated: Patient Education method: Customer service manager,  verbal cues Education comprehension: verbalized understanding, returned demonstration    HOME EXERCISE PROGRAM: No changes this session   PT Short Term Goals -       PT SHORT TERM GOAL #1   Title Patient will be independent in home exercise program to improve strength/mobility for better functional independence with ADLs.    Baseline 8/9: HEP to be given next session, 10/31: patient reports compliance with HEP and would like progressions added next session. 08/22/2021=Patient verbalized that he is walking and doing his LE exercises with no questions at this time.    Time 4    Period Weeks    Status Achieved    Target Date 08/16/21              PT Long Term Goals       PT LONG TERM GOAL #1   Title Patient will increase FOTO score to equal to or greater than 70%  to demonstrate statistically significant improvement in mobility and quality of life.    Baseline 8/9: 64%; risk adjusted 34%; 03/21/21 FOTO: 45; 04/24/21 FOTO: 49% ; 08/22/2021= 56% 4/20: 60% 11/28/21: 63%; 01/04/2022= 60%. 03/22/2022= 73%   Time 12    Period Weeks    Status Achieved   Target Date 06/26/2022     PT LONG TERM GOAL #2   Title Patient will increase Berg Balance score by > 6 points to demonstrate decreased fall risk during functional activities.    Baseline 4/20: 43/56 6/6: 48/56; 713/2023= 50/56   Time 12    Period Weeks    Status Achieved   Target Date 03/29/2022     PT LONG TERM GOAL #3   Title Patient will increase 10 meter walk test to >1.71m/s as to improve gait speed for better community ambulation and to reduce fall risk.    Baseline 8/9: 0.65 m/s with rollator; 10/31: 0.68 m/s with rollator. 07/06/2021= 0.71 m/s using rollator; 08/22/2021= 0.94 m/s using rollator. 4/20: 1.1 m/s with rollator    Time 12    Period Weeks    Status Achieved    Target Date 10/11/21      PT LONG TERM GOAL #4   Title Patient will increase six minute walk test distance to >1000 for progression to community ambulator and  improve gait ability    Baseline 8/9: 505 ft with rollator; 03/21/21: 769ft c rollator, 04/24/21: 721ft c rollator; 07/06/2021=900 feet; 08/22/2021= 935 feet with use of Rollator 44/20: 940 ft 11/28/21: 1055 feet with rollator   Time 12    Period Weeks    Status Achieved    Target Date 01/04/22      PT LONG TERM GOAL #5   Title Patient will increase glute medius strength on Left LE from able to lift off 3" (sidelye on mat) to > 5" to improve stability, gait  mechanics, and functional strength.    Baseline 10/31: 3-/5; 07/06/2021= 3-/5; 08/22/2021=3-/5 unable to achieve full ROM yet able to withstand some resistance with testing 4/20: unable to obtainfull ROM against gravity 6/6: unable to move against gravity; 01/04/2022= Continues to have difficulty - unable to raise left LE in sidelye against gravity but able to perform supine left Hip abd with some resistance= 3-/5; 03/22/2022- Patient able to raise his left LE 3 in off mat today. 04/03/2022= Patient able to raise left LE 3" off mat in sidelye today; 05/01/2022= 05/01/2022= patient able to raise left LE from sidelye position= 5"; 06/12/2022- Patient presents with 6.25 in sidelye Left hip abd against gravity. 06/26/2022= 7in in sidelye position.    Time 12    Period Weeks    Status MET   Target Date 06/26/2022     PT LONG TERM GOAL #6   Title Patient will increase Left single leg stance time to 15 seconds or greater to increase safety in shower and independence with ADLs.    Baseline 10/31; 1.83 sec without UE support; 08/22/2021=2-3  sec  on left; 6 sec on right 4/20: RLE 10 seconds LLE unable to perform 6/6: R LE 4 seconds during BERG; 01/04/2022= left = 3 sec and right = 8 sec; 02/13/2022= 20 sec on right at best and 4 sec on left LE at best. 03/23/2022=Patient able to stand on left LE at best for 7 sec today. 04/03/2022= 27 sec on right and 12 sec on left; 06/12/2022= 13 sec on left  and 33 sec at best on right LE; 06/26/2021= 11 sec on left at best and 22 sec on  right today; 07/26/2022= 10 sec on left and 20 sec on right today.; 09/04/22: 1-2 seconds on LLE. 27 sec on RLE.    Time 12    Period Weeks    Status On-going    Target Date 09/18/2022     PT LONG TERM GOAL #7   Title Patient will safely negotiate 13 steps with handrails to safely enter/exit mothers house.    Baseline 4/20: very challenging 6/6: able to copmlete safely even managing dogs per pt report; 01/04/2022- Patient performed 16 steps with B Rail-no difficulty other than fatigue.    Time 12    Period Weeks    Status Achieved   Target Date 01/04/22    PT LONG TERM GOAL #8  Title Patient will increase six minute walk test distance to >1100 ft.  for progression to community ambulator and improve gait ability   Baseline 04/03/2022= 920 feet with 4WW - (Added another goal for endurance to continue to progress community amb distances); 05/03/2022= 960 feet with upright 4WW (patient's first time using device): 06/12/2022= 990 feet with 4WW; 06/26/2022= 1010 feet with 4WW; 07/26/2022= 1015 feet; 09/04/22: 1,020'  Time 12   Period Weeks   Status Progressing  Target Date 09/18/2022       PT LONG TERM GOAL #9  Title Patient will be able to perform functional activities such as vacuuming or using backpack sprayer with modified independence for improved household chore abilities   Baseline 06/26/2022= Patient unable to perform either task but has strong desire to return to performing if able. 07/26/2022- Patient has not attempted to vacuum yet and PT session focusing on LE strengthening.; 09/04/22: Able to perform vacuuming tasks mod-I. Has to break up tasks due to limited standing tolerance. Has not done outdoor tasks due to weather.    Time 12   Period Weeks  Status Ongoing  Target Date 09/18/2022   PT LONG TERM GOAL #10  Title Patient will increase glute medius strength on Left LE from able to lift off 7" (sidelye on mat) to > 10" to improve Hip stability with standing, gait mechanics, and functional  strength.   Baseline 06/26/2022= 7in in sidelye position. 07/26/2022- 7.5 in; 09/04/22: 8"  Time 12   Period Weeks   Status ONGOING  Target Date 09/18/2022     Plan - 12/13/21 1340     Clinical Impression Statement Patient perform several sets of a few significant LE exercises to assist with power and stamina of standing on left LE. He was able to accept good amount of resistance and worked hard til failure with each exercise. Pt will continue to benefit from skilled PT services to address remaining deficits to maximize his functional independence with mobility and decrease fall risk.    Personal Factors and Comorbidities Age;Comorbidity 3+;Fitness;Past/Current Experience;Time since onset of injury/illness/exacerbation    Comorbidities aroxysmal a fib, MS, lymphedema, hypothyroidism, Graves Disease, arthritis, lumbar herniated disc (L5), depression and neuropathy    Examination-Activity Limitations Bed Mobility;Bend;Caring for Others;Carry;Reach Overhead;Locomotion Level;Lift;Hygiene/Grooming;Dressing;Squat;Stairs;Stand;Toileting;Transfers    Examination-Participation Restrictions Cleaning;Community Activity;Laundry;Occupation;Shop;Volunteer;Yard Work    Merchant navy officer Evolving/Moderate complexity    Rehab Potential Fair    PT Frequency 2x / week    PT Duration 12 weeks    PT Treatment/Interventions ADLs/Self Care Home Management;Aquatic Therapy;Canalith Repostioning;Biofeedback;Cryotherapy;Ultrasound;Traction;Moist Heat;Electrical Stimulation;DME Instruction;Gait training;Therapeutic exercise;Therapeutic activities;Functional mobility training;Stair training;Balance training;Neuromuscular re-education;Patient/family education;Manual techniques;Orthotic Fit/Training;Compression bandaging;Passive range of motion;Vestibular;Taping;Splinting;Energy conservation;Dry needling;Visual/perceptual remediation/compensation    PT Next Visit Plan Continue with progessive balance training,  Progressive LE strenthening as appropriate.    PT Home Exercise Plan Access Code: [email or web address] URL: [email or web address]  Added Bridges with BTB and encouraged more walking around the home during commerical breaks when watching TV.    Consulted and Agree with Plan of Care Patient             Ollen Bowl, PT Physical Therapist- [place]  [facility]

## 2022-09-11 ENCOUNTER — Ambulatory Visit: Payer: Medicare PPO

## 2022-09-11 DIAGNOSIS — R269 Unspecified abnormalities of gait and mobility: Secondary | ICD-10-CM | POA: Diagnosis not present

## 2022-09-11 DIAGNOSIS — R262 Difficulty in walking, not elsewhere classified: Secondary | ICD-10-CM | POA: Diagnosis not present

## 2022-09-11 DIAGNOSIS — R2689 Other abnormalities of gait and mobility: Secondary | ICD-10-CM

## 2022-09-11 DIAGNOSIS — R2681 Unsteadiness on feet: Secondary | ICD-10-CM

## 2022-09-11 DIAGNOSIS — R278 Other lack of coordination: Secondary | ICD-10-CM

## 2022-09-11 DIAGNOSIS — M6281 Muscle weakness (generalized): Secondary | ICD-10-CM | POA: Diagnosis not present

## 2022-09-11 NOTE — Therapy (Signed)
OUTPATIENT PHYSICAL THERAPY TREATMENT NOTE   Patient Name: Dustin Gray MRN: [id number] DOB:07/19/1955, 67 y.o., male Today's Date: 09/11/2022  PCP: [name], MD REFERRING PROVIDER: [name], MD   PT End of Session - 09/11/22 1442     Visit Number 122    Number of Visits 126    Date for PT Re-Evaluation 09/18/22    Authorization Type [employer] Auth 11/28/21-02/22/22 for 24 visits    Authorization Time Period 06/26/2022-09/18/2022    Progress Note Due on Visit 130    PT Start Time 1433    PT Stop Time 1515    PT Time Calculation (min) 42 min    Equipment Utilized During Treatment Gait belt    Activity Tolerance Patient tolerated treatment well;No increased pain    Behavior During Therapy WFL for tasks assessed/performed                Past Medical History:  Diagnosis Date   Abscess    groin   Arthritis    lower spine   Erectile dysfunction    Low testosterone    Lumbar herniated disc    L5   Multiple sclerosis ([place])    Staph aureus infection    Past Surgical History:  Procedure Laterality Date   CARDIOVERSION N/A 11/22/2021   Procedure: CARDIOVERSION;  Surgeon: [name], MD;  Location: [facility];  Service: Cardiovascular;  Laterality: N/A;   COLONOSCOPY WITH PROPOFOL N/A 07/04/2016   Procedure: COLONOSCOPY WITH PROPOFOL;  Surgeon: [name], MD;  Location: [place];  Service: Endoscopy;  Laterality: N/A;   [place]   POLYPECTOMY  07/04/2016   Procedure: POLYPECTOMY;  Surgeon: [name], MD;  Location: [place];  Service: Endoscopy;;   TONSILLECTOMY     Patient Active Problem List   Diagnosis Date Noted   Persistent atrial fibrillation ([place])    Graves disease    Abdominal bloating    Atrial fibrillation with RVR ([place]) 10/01/2019   Hyperthyroidism    Atrial fibrillation with rapid ventricular response ([place]) 09/30/2019   Leg edema    Left leg cellulitis    Special  screening for malignant neoplasms, colon    Polyp of sigmoid colon    Benign neoplasm of ascending colon    Rectal polyp    Herniated nucleus pulposus, L5-S1 11/09/2015   Low back pain 10/25/2015   Herniated nucleus pulposus, C5-6 right 10/06/2015   Dysesthesia 09/16/2015   Spasticity 09/16/2015   Unsteady gait 09/16/2015   Multiple sclerosis ([place]) 06/08/2011     REFERRING DIAG: R26.81 (ICD-10-CM) - Unsteadiness on feet   THERAPY DIAG:  Unsteadiness on feet  Difficulty in walking, not elsewhere classified  Abnormality of gait and mobility  Muscle weakness (generalized)  Rationale for Evaluation and Treatment Rehabilitation  PERTINENT HISTORY: Patient presents to physical therapy for unsteadiness, walking instability, fatigue. His PMH includes paroxysmal a fib, MS, lymphedema, hypothyroidism, Graves Disease, arthritis, lumbar herniated disc (L5), depression and neuropathy. Patient first developed symptoms of MS in 2005 and was diagnosed in 2006. Patient has done PT in the past, but hasn't been seen since 2020 at [facility]. Has not been doing his exercises in the past year. Walk outside to garage to ride lawnmower, walks in house. 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 Back pain none currently    TODAY'S TREATMENT:  THEREX:  Seated knee ext using matrix cable system 7.5# 3 sets of 12 reps each LE  Standing hip ext using matrix cable system 7.5 # - 3 sets of 12 reps each LE  Standing hip abd using 7.5# resistance- 3 sets of 12 reps each LE    PATIENT EDUCATION: Education details: exercise technique, body mechanics Person educated: Patient Education method: Customer service manager, verbal cues Education comprehension: verbalized understanding, returned demonstration    HOME  EXERCISE PROGRAM: No changes this session   PT Short Term Goals -       PT SHORT TERM GOAL #1   Title Patient will be independent in home exercise program to improve strength/mobility for better functional independence with ADLs.    Baseline 8/9: HEP to be given next session, 10/31: patient reports compliance with HEP and would like progressions added next session. 08/22/2021=Patient verbalized that he is walking and doing his LE exercises with no questions at this time.    Time 4    Period Weeks    Status Achieved    Target Date 08/16/21              PT Long Term Goals       PT LONG TERM GOAL #1   Title Patient will increase FOTO score to equal to or greater than 70%  to demonstrate statistically significant improvement in mobility and quality of life.    Baseline 8/9: 64%; risk adjusted 34%; 03/21/21 FOTO: 45; 04/24/21 FOTO: 49% ; 08/22/2021= 56% 4/20: 60% 11/28/21: 63%; 01/04/2022= 60%. 03/22/2022= 73%   Time 12    Period Weeks    Status Achieved   Target Date 06/26/2022     PT LONG TERM GOAL #2   Title Patient will increase Berg Balance score by > 6 points to demonstrate decreased fall risk during functional activities.    Baseline 4/20: 43/56 6/6: 48/56; 713/2023= 50/56   Time 12    Period Weeks    Status Achieved   Target Date 03/29/2022     PT LONG TERM GOAL #3   Title Patient will increase 10 meter walk test to >1.64m/s as to improve gait speed for better community ambulation and to reduce fall risk.    Baseline 8/9: 0.65 m/s with rollator; 10/31: 0.68 m/s with rollator. 07/06/2021= 0.71 m/s using rollator; 08/22/2021= 0.94 m/s using rollator. 4/20: 1.1 m/s with rollator    Time 12    Period Weeks    Status Achieved    Target Date 10/11/21      PT LONG TERM GOAL #4   Title Patient will increase six minute walk test distance to >1000 for progression to community ambulator and improve gait ability    Baseline 8/9: 505 ft with rollator; 03/21/21: 743ft c rollator, 04/24/21:  730ft c rollator; 07/06/2021=900 feet; 08/22/2021= 935 feet with use of Rollator 44/20: 940 ft 11/28/21: 1055 feet with rollator   Time 12    Period Weeks    Status Achieved    Target Date 01/04/22      PT LONG TERM GOAL #5   Title Patient will increase glute medius strength on Left LE from able to lift off 3" (sidelye on mat) to > 5" to improve stability, gait mechanics, and functional strength.    Baseline 10/31: 3-/5; 07/06/2021= 3-/5; 08/22/2021=3-/5 unable to achieve full ROM yet able to withstand some resistance  with testing 4/20: unable to obtainfull ROM against gravity 6/6: unable to move against gravity; 01/04/2022= Continues to have difficulty - unable to raise left LE in sidelye against gravity but able to perform supine left Hip abd with some resistance= 3-/5; 03/22/2022- Patient able to raise his left LE 3 in off mat today. 04/03/2022= Patient able to raise left LE 3" off mat in sidelye today; 05/01/2022= 05/01/2022= patient able to raise left LE from sidelye position= 5"; 06/12/2022- Patient presents with 6.25 in sidelye Left hip abd against gravity. 06/26/2022= 7in in sidelye position.    Time 12    Period Weeks    Status MET   Target Date 06/26/2022     PT LONG TERM GOAL #6   Title Patient will increase Left single leg stance time to 15 seconds or greater to increase safety in shower and independence with ADLs.    Baseline 10/31; 1.83 sec without UE support; 08/22/2021=2-3  sec  on left; 6 sec on right 4/20: RLE 10 seconds LLE unable to perform 6/6: R LE 4 seconds during BERG; 01/04/2022= left = 3 sec and right = 8 sec; 02/13/2022= 20 sec on right at best and 4 sec on left LE at best. 03/23/2022=Patient able to stand on left LE at best for 7 sec today. 04/03/2022= 27 sec on right and 12 sec on left; 06/12/2022= 13 sec on left  and 33 sec at best on right LE; 06/26/2021= 11 sec on left at best and 22 sec on right today; 07/26/2022= 10 sec on left and 20 sec on right today.; 09/04/22: 1-2 seconds on LLE. 27  sec on RLE.    Time 12    Period Weeks    Status On-going    Target Date 09/18/2022     PT LONG TERM GOAL #7   Title Patient will safely negotiate 13 steps with handrails to safely enter/exit mothers house.    Baseline 4/20: very challenging 6/6: able to copmlete safely even managing dogs per pt report; 01/04/2022- Patient performed 16 steps with B Rail-no difficulty other than fatigue.    Time 12    Period Weeks    Status Achieved   Target Date 01/04/22    PT LONG TERM GOAL #8  Title Patient will increase six minute walk test distance to >1100 ft.  for progression to community ambulator and improve gait ability   Baseline 04/03/2022= 920 feet with 4WW - (Added another goal for endurance to continue to progress community amb distances); 05/03/2022= 960 feet with upright 4WW (patient's first time using device): 06/12/2022= 990 feet with 4WW; 06/26/2022= 1010 feet with 4WW; 07/26/2022= 1015 feet; 09/04/22: 1,020'  Time 12   Period Weeks   Status Progressing  Target Date 09/18/2022       PT LONG TERM GOAL #9  Title Patient will be able to perform functional activities such as vacuuming or using backpack sprayer with modified independence for improved household chore abilities   Baseline 06/26/2022= Patient unable to perform either task but has strong desire to return to performing if able. 07/26/2022- Patient has not attempted to vacuum yet and PT session focusing on LE strengthening.; 09/04/22: Able to perform vacuuming tasks mod-I. Has to break up tasks due to limited standing tolerance. Has not done outdoor tasks due to weather.    Time 12   Period Weeks   Status Ongoing  Target Date 09/18/2022   PT LONG TERM GOAL #10  Title Patient will increase glute medius strength on  Left LE from able to lift off 7" (sidelye on mat) to > 10" to improve Hip stability with standing, gait mechanics, and functional strength.   Baseline 06/26/2022= 7in in sidelye position. 07/26/2022- 7.5 in; 09/04/22: 8"  Time 12    Period Weeks   Status ONGOING  Target Date 09/18/2022     Plan - 12/13/21 1340     Clinical Impression Statement Patient continues to work hard with some basic LE resistance training. HE performed well with physical assist to adjust equipment  around leg with various exercises. He did exhibit some left knee hyperextension with static standing on left while performing resistive exercises on right.  Pt will continue to benefit from skilled PT services to address remaining deficits to maximize his functional independence with mobility and decrease fall risk.    Personal Factors and Comorbidities Age;Comorbidity 3+;Fitness;Past/Current Experience;Time since onset of injury/illness/exacerbation    Comorbidities aroxysmal a fib, MS, lymphedema, hypothyroidism, Graves Disease, arthritis, lumbar herniated disc (L5), depression and neuropathy    Examination-Activity Limitations Bed Mobility;Bend;Caring for Others;Carry;Reach Overhead;Locomotion Level;Lift;Hygiene/Grooming;Dressing;Squat;Stairs;Stand;Toileting;Transfers    Examination-Participation Restrictions Cleaning;Community Activity;Laundry;Occupation;Shop;Volunteer;Yard Work    Merchant navy officer Evolving/Moderate complexity    Rehab Potential Fair    PT Frequency 2x / week    PT Duration 12 weeks    PT Treatment/Interventions ADLs/Self Care Home Management;Aquatic Therapy;Canalith Repostioning;Biofeedback;Cryotherapy;Ultrasound;Traction;Moist Heat;Electrical Stimulation;DME Instruction;Gait training;Therapeutic exercise;Therapeutic activities;Functional mobility training;Stair training;Balance training;Neuromuscular re-education;Patient/family education;Manual techniques;Orthotic Fit/Training;Compression bandaging;Passive range of motion;Vestibular;Taping;Splinting;Energy conservation;Dry needling;Visual/perceptual remediation/compensation    PT Next Visit Plan Continue with progessive balance training, Progressive LE strenthening as  appropriate.    PT Home Exercise Plan Access Code: [email or web address] URL: [email or web address]  Added Bridges with BTB and encouraged more walking around the home during commerical breaks when watching TV.    Consulted and Agree with Plan of Care Patient             Ollen Bowl, PT Physical Therapist- [place]  [facility]

## 2022-09-13 ENCOUNTER — Ambulatory Visit: Payer: Medicare PPO

## 2022-09-13 DIAGNOSIS — R278 Other lack of coordination: Secondary | ICD-10-CM

## 2022-09-13 DIAGNOSIS — R262 Difficulty in walking, not elsewhere classified: Secondary | ICD-10-CM

## 2022-09-13 DIAGNOSIS — R2681 Unsteadiness on feet: Secondary | ICD-10-CM

## 2022-09-13 DIAGNOSIS — M6281 Muscle weakness (generalized): Secondary | ICD-10-CM

## 2022-09-13 DIAGNOSIS — R2689 Other abnormalities of gait and mobility: Secondary | ICD-10-CM

## 2022-09-13 DIAGNOSIS — R269 Unspecified abnormalities of gait and mobility: Secondary | ICD-10-CM | POA: Diagnosis not present

## 2022-09-13 NOTE — Therapy (Signed)
OUTPATIENT PHYSICAL THERAPY TREATMENT NOTE   Patient Name: Dustin Gray MRN: [id number] DOB:January 05, 1956, 67 y.o., male Today's Date: 09/13/2022  PCP: [name], MD REFERRING PROVIDER: [name], MD   PT End of Session - 09/13/22 1452     Visit Number [id number]    Number of Visits 126    Date for PT Re-Evaluation 09/18/22    Authorization Type [employer] Auth 11/28/21-02/22/22 for 24 visits    Authorization Time Period 06/26/2022-09/18/2022    Progress Note Due on Visit 130    PT Start Time 1433    PT Stop Time 1513    PT Time Calculation (min) 40 min    Equipment Utilized During Treatment Gait belt    Activity Tolerance Patient tolerated treatment well;No increased pain    Behavior During Therapy WFL for tasks assessed/performed                Past Medical History:  Diagnosis Date   Abscess    groin   Arthritis    lower spine   Erectile dysfunction    Low testosterone    Lumbar herniated disc    L5   Multiple sclerosis ([place])    Staph aureus infection    Past Surgical History:  Procedure Laterality Date   CARDIOVERSION N/A 11/22/2021   Procedure: CARDIOVERSION;  Surgeon: [name], MD;  Location: [facility];  Service: Cardiovascular;  Laterality: N/A;   COLONOSCOPY WITH PROPOFOL N/A 07/04/2016   Procedure: COLONOSCOPY WITH PROPOFOL;  Surgeon: [name], MD;  Location: [place];  Service: Endoscopy;  Laterality: N/A;   [place]   POLYPECTOMY  07/04/2016   Procedure: POLYPECTOMY;  Surgeon: [name], MD;  Location: [place];  Service: Endoscopy;;   TONSILLECTOMY     Patient Active Problem List   Diagnosis Date Noted   Persistent atrial fibrillation ([place])    Graves disease    Abdominal bloating    Atrial fibrillation with RVR ([place]) 10/01/2019   Hyperthyroidism    Atrial fibrillation with rapid ventricular response ([place]) 09/30/2019   Leg edema    Left leg cellulitis    Special  screening for malignant neoplasms, colon    Polyp of sigmoid colon    Benign neoplasm of ascending colon    Rectal polyp    Herniated nucleus pulposus, L5-S1 11/09/2015   Low back pain 10/25/2015   Herniated nucleus pulposus, C5-6 right 10/06/2015   Dysesthesia 09/16/2015   Spasticity 09/16/2015   Unsteady gait 09/16/2015   Multiple sclerosis ([place]) 06/08/2011     REFERRING DIAG: R26.81 (ICD-10-CM) - Unsteadiness on feet   THERAPY DIAG:  Unsteadiness on feet  Difficulty in walking, not elsewhere classified  Abnormality of gait and mobility  Muscle weakness (generalized)  Rationale for Evaluation and Treatment Rehabilitation  PERTINENT HISTORY: Patient presents to physical therapy for unsteadiness, walking instability, fatigue. His PMH includes paroxysmal a fib, MS, lymphedema, hypothyroidism, Graves Disease, arthritis, lumbar herniated disc (L5), depression and neuropathy. Patient first developed symptoms of MS in 2005 and was diagnosed in 2006. Patient has done PT in the past, but hasn't been seen since 2020 at [facility]. Has not been doing his exercises in the past year. Walk outside to garage to ride lawnmower, walks in house. Retired Automotive engineer. Has a rollator at home but doesn't use it in the house   PRECAUTIONS: Fall  SUBJECTIVE: Patient denies any issues- states feeling okay today.    PAIN:  Are you having pain? Back pain none currently    TODAY'S TREATMENT:  THEREX:  Resistive gait - 5# AW x 5 min walk with rollator- good reciprocal steps and good foot clearance- Min VC for posture.   Bridging 2 sets of 10 reps- (brief rest between sets)   Hip flex/abd up/over hedgehog with 5# 2 sets of 10 on right and 1 set of 10 on left; 2nd second set on left without ankle weight due to fatigue.    Heel slides (left LE on slideboard) 3 sets of 10 reps and on right 5# AW - 3 sets of 10 reps   PATIENT EDUCATION: Education details: exercise technique, body  mechanics Person educated: Patient Education method: Customer service manager, verbal cues Education comprehension: verbalized understanding, returned demonstration    HOME EXERCISE PROGRAM: No changes this session   PT Short Term Goals -       PT SHORT TERM GOAL #1   Title Patient will be independent in home exercise program to improve strength/mobility for better functional independence with ADLs.    Baseline 8/9: HEP to be given next session, 10/31: patient reports compliance with HEP and would like progressions added next session. 08/22/2021=Patient verbalized that he is walking and doing his LE exercises with no questions at this time.    Time 4    Period Weeks    Status Achieved    Target Date 08/16/21              PT Long Term Goals       PT LONG TERM GOAL #1   Title Patient will increase FOTO score to equal to or greater than 70%  to demonstrate statistically significant improvement in mobility and quality of life.    Baseline 8/9: 64%; risk adjusted 34%; 03/21/21 FOTO: 45; 04/24/21 FOTO: 49% ; 08/22/2021= 56% 4/20: 60% 11/28/21: 63%; 01/04/2022= 60%. 03/22/2022= 73%   Time 12    Period Weeks    Status Achieved   Target Date 06/26/2022     PT LONG TERM GOAL #2   Title Patient will increase Berg Balance score by > 6 points to demonstrate decreased fall risk during functional activities.    Baseline 4/20: 43/56 6/6: 48/56; 713/2023= 50/56   Time 12    Period Weeks    Status Achieved   Target Date 03/29/2022     PT LONG TERM GOAL #3   Title Patient will increase 10 meter walk test to >1.45m/s as to improve gait speed for better community ambulation and to reduce fall risk.    Baseline 8/9: 0.65 m/s with rollator; 10/31: 0.68 m/s with rollator. 07/06/2021= 0.71 m/s using rollator; 08/22/2021= 0.94 m/s using rollator. 4/20: 1.1 m/s with rollator    Time 12    Period Weeks    Status Achieved    Target Date 10/11/21      PT LONG TERM GOAL #4   Title Patient will  increase six minute walk test distance to >1000 for progression to community ambulator and improve gait ability    Baseline 8/9: 505 ft with rollator; 03/21/21: 758ft c rollator, 04/24/21: 753ft c rollator; 07/06/2021=900 feet; 08/22/2021= 935 feet with use of Rollator 44/20: 940 ft 11/28/21: 1055 feet with rollator   Time 12    Period Weeks    Status Achieved    Target Date 01/04/22      PT LONG TERM GOAL #5   Title Patient will increase glute medius strength on Left LE from able to  lift off 3" (sidelye on mat) to > 5" to improve stability, gait mechanics, and functional strength.    Baseline 10/31: 3-/5; 07/06/2021= 3-/5; 08/22/2021=3-/5 unable to achieve full ROM yet able to withstand some resistance with testing 4/20: unable to obtainfull ROM against gravity 6/6: unable to move against gravity; 01/04/2022= Continues to have difficulty - unable to raise left LE in sidelye against gravity but able to perform supine left Hip abd with some resistance= 3-/5; 03/22/2022- Patient able to raise his left LE 3 in off mat today. 04/03/2022= Patient able to raise left LE 3" off mat in sidelye today; 05/01/2022= 05/01/2022= patient able to raise left LE from sidelye position= 5"; 06/12/2022- Patient presents with 6.25 in sidelye Left hip abd against gravity. 06/26/2022= 7in in sidelye position.    Time 12    Period Weeks    Status MET   Target Date 06/26/2022     PT LONG TERM GOAL #6   Title Patient will increase Left single leg stance time to 15 seconds or greater to increase safety in shower and independence with ADLs.    Baseline 10/31; 1.83 sec without UE support; 08/22/2021=2-3  sec  on left; 6 sec on right 4/20: RLE 10 seconds LLE unable to perform 6/6: R LE 4 seconds during BERG; 01/04/2022= left = 3 sec and right = 8 sec; 02/13/2022= 20 sec on right at best and 4 sec on left LE at best. 03/23/2022=Patient able to stand on left LE at best for 7 sec today. 04/03/2022= 27 sec on right and 12 sec on left; 06/12/2022= 13  sec on left  and 33 sec at best on right LE; 06/26/2021= 11 sec on left at best and 22 sec on right today; 07/26/2022= 10 sec on left and 20 sec on right today.; 09/04/22: 1-2 seconds on LLE. 27 sec on RLE.    Time 12    Period Weeks    Status On-going    Target Date 09/18/2022     PT LONG TERM GOAL #7   Title Patient will safely negotiate 13 steps with handrails to safely enter/exit mothers house.    Baseline 4/20: very challenging 6/6: able to copmlete safely even managing dogs per pt report; 01/04/2022- Patient performed 16 steps with B Rail-no difficulty other than fatigue.    Time 12    Period Weeks    Status Achieved   Target Date 01/04/22    PT LONG TERM GOAL #8  Title Patient will increase six minute walk test distance to >1100 ft.  for progression to community ambulator and improve gait ability   Baseline 04/03/2022= 920 feet with 4WW - (Added another goal for endurance to continue to progress community amb distances); 05/03/2022= 960 feet with upright 4WW (patient's first time using device): 06/12/2022= 990 feet with 4WW; 06/26/2022= 1010 feet with 4WW; 07/26/2022= 1015 feet; 09/04/22: 1,020'  Time 12   Period Weeks   Status Progressing  Target Date 09/18/2022       PT LONG TERM GOAL #9  Title Patient will be able to perform functional activities such as vacuuming or using backpack sprayer with modified independence for improved household chore abilities   Baseline 06/26/2022= Patient unable to perform either task but has strong desire to return to performing if able. 07/26/2022- Patient has not attempted to vacuum yet and PT session focusing on LE strengthening.; 09/04/22: Able to perform vacuuming tasks mod-I. Has to break up tasks due to limited standing tolerance. Has not done outdoor  tasks due to weather.    Time 12   Period Weeks   Status Ongoing  Target Date 09/18/2022   PT LONG TERM GOAL #10  Title Patient will increase glute medius strength on Left LE from able to lift off 7"  (sidelye on mat) to > 10" to improve Hip stability with standing, gait mechanics, and functional strength.   Baseline 06/26/2022= 7in in sidelye position. 07/26/2022- 7.5 in; 09/04/22: 8"  Time 12   Period Weeks   Status ONGOING  Target Date 09/18/2022     Plan - 12/13/21 1340     Clinical Impression Statement Patient presents with good motivation for today's LE strengthening. He was challenged with supine resistive exercises with fatigue. He was able to lift LE well today but fatigues quickly. Pt will continue to benefit from skilled PT services to address remaining deficits to maximize his functional independence with mobility and decrease fall risk.    Personal Factors and Comorbidities Age;Comorbidity 3+;Fitness;Past/Current Experience;Time since onset of injury/illness/exacerbation    Comorbidities aroxysmal a fib, MS, lymphedema, hypothyroidism, Graves Disease, arthritis, lumbar herniated disc (L5), depression and neuropathy    Examination-Activity Limitations Bed Mobility;Bend;Caring for Others;Carry;Reach Overhead;Locomotion Level;Lift;Hygiene/Grooming;Dressing;Squat;Stairs;Stand;Toileting;Transfers    Examination-Participation Restrictions Cleaning;Community Activity;Laundry;Occupation;Shop;Volunteer;Yard Work    Merchant navy officer Evolving/Moderate complexity    Rehab Potential Fair    PT Frequency 2x / week    PT Duration 12 weeks    PT Treatment/Interventions ADLs/Self Care Home Management;Aquatic Therapy;Canalith Repostioning;Biofeedback;Cryotherapy;Ultrasound;Traction;Moist Heat;Electrical Stimulation;DME Instruction;Gait training;Therapeutic exercise;Therapeutic activities;Functional mobility training;Stair training;Balance training;Neuromuscular re-education;Patient/family education;Manual techniques;Orthotic Fit/Training;Compression bandaging;Passive range of motion;Vestibular;Taping;Splinting;Energy conservation;Dry needling;Visual/perceptual remediation/compensation     PT Next Visit Plan Continue with progessive balance training, Progressive LE strenthening as appropriate.    PT Home Exercise Plan Access Code: [email or web address] URL: [email or web address]  Added Bridges with BTB and encouraged more walking around the home during commerical breaks when watching TV.    Consulted and Agree with Plan of Care Patient             Ollen Bowl, PT Physical Therapist- [place]  [facility]

## 2022-09-20 ENCOUNTER — Ambulatory Visit: Payer: Medicare PPO

## 2022-09-21 ENCOUNTER — Ambulatory Visit: Payer: Medicare PPO | Attending: Cardiology | Admitting: Cardiology

## 2022-09-21 ENCOUNTER — Encounter: Payer: Self-pay | Admitting: Cardiology

## 2022-09-21 VITALS — BP 122/72 | HR 64 | Ht 74.0 in | Wt 277.6 lb

## 2022-09-21 DIAGNOSIS — E0590 Thyrotoxicosis, unspecified without thyrotoxic crisis or storm: Secondary | ICD-10-CM | POA: Diagnosis not present

## 2022-09-21 DIAGNOSIS — I4819 Other persistent atrial fibrillation: Secondary | ICD-10-CM | POA: Diagnosis not present

## 2022-09-21 DIAGNOSIS — I429 Cardiomyopathy, unspecified: Secondary | ICD-10-CM

## 2022-09-21 MED ORDER — PROPRANOLOL HCL ER 80 MG PO CP24: 80.0000 mg | ORAL_CAPSULE | Freq: Two times a day (BID) | ORAL | 2 refills | Status: DC

## 2022-09-21 NOTE — Patient Instructions (Signed)
Medication Instructions:   Your physician recommends that you continue on your current medications as directed. Please refer to the Current Medication list given to you today.  *If you need a refill on your cardiac medications before your next appointment, please call your pharmacy*   Lab Work:  None Ordered  If you have labs (blood work) drawn today and your tests are completely normal, you will receive your results only by: MyChart Message (if you have MyChart) OR A paper copy in the mail If you have any lab test that is abnormal or we need to change your treatment, we will call you to review the results.   Testing/Procedures:  None Ordered    Follow-Up: At [facility], you and your health needs are our priority.  As part of our continuing mission to provide you with exceptional heart care, we have created designated Provider Care Teams.  These Care Teams include your primary Cardiologist (physician) and Advanced Practice Providers (APPs -  Physician Assistants and Nurse Practitioners) who all work together to provide you with the care you need, when you need it.  We recommend signing up for the patient portal called "MyChart".  Sign up information is provided on this After Visit Summary.  MyChart is used to connect with patients for Virtual Visits (Telemedicine).  Patients are able to view lab/test results, encounter notes, upcoming appointments, etc.  Non-urgent messages can be sent to your provider as well.   To learn more about what you can do with MyChart, go to [email or web address].    Your next appointment:   12 month(s)  Provider:   You may see Brian Agbor-Etang, MD or one of the following Advanced Practice Providers on your designated Care Team:   Christopher Berge, NP Ryan Dunn, PA-C Cadence Furth, PA-C Sheri Hammock, NP  

## 2022-09-21 NOTE — Progress Notes (Signed)
Cardiology Office Note:    Date:  09/21/2022   ID:  Dustin Gray, DOB 1956/01/18, MRN [id number]  PCP:  [name], MD  Cardiologist:  [name], MD  Electrophysiologist:  [name], MD   Referring MD: [name], MD   Chief Complaint  Patient presents with   Follow-up    6 month f/u, no new cardiac concerns     History of Present Illness:    Dustin Gray is a 67 y.o. male with a hx of persistent atrial fibrillation s/p DCCV 5/23, multiple sclerosis, lymphedema, hyperthyroidism/Graves' disease, who presents for follow-up.   Being seen for procedure in A-fib driven by hyperthyroidism.  Underwent DC cardioversion, has been maintaining sinus rhythm since, thyroid function normalized.  Repeat echocardiogram showed normalization of EF.  Feels well, has no concerns at this time.  No bleeding issues with Eliquis.  Prior notes/studies Echo 10/23 EF 55 to 60% Echo 08/2021 EF 40 to 45% Patient originally seen in the hospital for atrial fibrillation with rapid ventricular response.  He was managed with diltiazem and Eliquis.  He was subsequently diagnosed with hyperthyroidism and propanolol ER added to his medical regimen.   Echocardiogram on 09/2019 showed normal ejection fraction with EF 55 to 60%, left atrial size was also normal.   He was started on anticoagulation due to plan for cardioversion.  Cardioversion was attempted but unsuccessful.  His heart rates have improved when hyperthyroidism is controlled.  Past Medical History:  Diagnosis Date   Abscess    groin   Arthritis    lower spine   Erectile dysfunction    Low testosterone    Lumbar herniated disc    L5   Multiple sclerosis ([place])    Staph aureus infection     Past Surgical History:  Procedure Laterality Date   CARDIOVERSION N/A 11/22/2021   Procedure: CARDIOVERSION;  Surgeon: [name], MD;  Location: [facility];  Service: Cardiovascular;  Laterality: N/A;   COLONOSCOPY WITH  PROPOFOL N/A 07/04/2016   Procedure: COLONOSCOPY WITH PROPOFOL;  Surgeon: [name], MD;  Location: [place];  Service: Endoscopy;  Laterality: N/A;   [place]   POLYPECTOMY  07/04/2016   Procedure: POLYPECTOMY;  Surgeon: [name], MD;  Location: [facility];  Service: Endoscopy;;   TONSILLECTOMY      Current Medications: Current Meds  Medication Sig   apixaban (ELIQUIS) 5 MG TABS tablet Take 1 tablet (5 mg total) by mouth 2 (two) times daily.   baclofen (LIORESAL) 10 MG tablet Take 10 mg by mouth 3 (three) times daily.   diltiazem (CARDIZEM SR) 120 MG 12 hr capsule Take 1 capsule (120 mg total) by mouth 2 (two) times daily.   Dimethyl Fumarate 240 MG CPDR Take 240 mg by mouth 2 (two) times daily.    furosemide (LASIX) 40 MG tablet Take 1 tablet (40 mg total) by mouth as needed. With your potassium.   gabapentin (NEURONTIN) 300 MG capsule Take 300-600 mg by mouth See admin instructions. Take 1 capsule (300 mg) by mouth in the morning, take 1 capsule (300 mg) by mouth at midday, & take 2 capsules (600 mg) by mouth at bedtime.   meloxicam (MOBIC) 15 MG tablet Take 15 mg by mouth daily as needed for pain.    methimazole (TAPAZOLE) 5 MG tablet Take 0.5 tablets by mouth daily.   Multiple Vitamins-Minerals (CENTRUM MINIS MEN 50+ PO) Take by mouth.  potassium chloride (KLOR-CON) 10 MEQ tablet Take 1 tablet (10 mEq total) by mouth as needed. Take with your Lasix.   [DISCONTINUED] propranolol ER (INDERAL LA) 80 MG 24 hr capsule Take 1 capsule (80 mg total) by mouth 2 (two) times daily.     Allergies:   Cephalexin, Ancef [cefazolin sodium], and Cefadroxil   Social History   Socioeconomic History   Marital status: Legally Separated    Spouse name: Not on file   Number of children: Not on file   Years of education: Not on file   Highest education level: Not on file  Occupational History   Not on file  Tobacco Use   Smoking status:  Former   Smokeless tobacco: Never   Tobacco comments:    smoked as teenager  Vaping Use   Vaping Use: Never used  Substance and Sexual Activity   Alcohol use: Not Currently    Comment: 2 drinks/month   Drug use: No   Sexual activity: Not on file  Other Topics Concern   Not on file  Social History Narrative   Not on file   Social Determinants of Health   Financial Resource Strain: Not on file  Food Insecurity: Not on file  Transportation Needs: Not on file  Physical Activity: Not on file  Stress: Not on file  Social Connections: Not on file     Family History: The patient's family history includes Diabetes in his father.  ROS:   Please see the history of present illness.     All other systems reviewed and are negative.  EKGs/Labs/Other Studies Reviewed:    The following studies were reviewed today:   EKG:  EKG is  ordered today.  The ekg ordered today demonstrates normal sinus rhythm, normal ECG, heart rate 64  Recent Labs: 10/27/2021: BUN 13; Creatinine, Ser 0.92; Hemoglobin 15.9; Platelets 217; Potassium 4.7; Sodium 140  Recent Lipid Panel    Component Value Date/Time   CHOL 108 10/01/2019 0358   TRIG 81 10/01/2019 0358   HDL 35 (L) 10/01/2019 0358   CHOLHDL 3.1 10/01/2019 0358   VLDL 16 10/01/2019 0358   LDLCALC 57 10/01/2019 0358    Physical Exam:    VS:  BP 122/72 (BP Location: Left Arm, Patient Position: Sitting, Cuff Size: Normal)   Pulse 64   Ht 6\' 2"  (1.88 m)   Wt 277 lb 9.6 oz (125.9 kg)   SpO2 96%   BMI 35.64 kg/m     Wt Readings from Last 3 Encounters:  09/21/22 277 lb 9.6 oz (125.9 kg)  03/23/22 264 lb (119.7 kg)  02/21/22 260 lb (117.9 kg)     GEN:  Well nourished, well developed in no acute distress HEENT: Normal NECK: No JVD; No carotid bruits CARDIAC: Regular rate and rhythm RESPIRATORY:  Clear to auscultation without rales, wheezing or rhonchi  ABDOMEN: Soft, non-tender, non-distended MUSCULOSKELETAL:  no edema; No deformity   SKIN: Warm and dry NEUROLOGIC:  Alert and oriented x 3 PSYCHIATRIC:  Normal affect   ASSESSMENT:    1. Persistent atrial fibrillation (H[facility])   2. Cardiomyopathy, unspecified type ([place])   3. Hyperthyroidism    PLAN:    In order of problems listed above:  Persistent atrial fibrillation.  S/p DC cardioversion 11/22/2021.  Maintaining sinus rhythm . Continue Cardizem SR 120 twice daily, continue Inderal, Eliquis 5 mg twice daily.  A-fib driven by thyroid dysfunction.   Cardiomyopathy (initial EF 40 to 45%, echo 10/23 EF 55-60%).  Likely tachycardia/A-fib  induced.  Continue Cardizem and Inderal. hyperthyroidism.  On methimazole. management as per endocrinology.  Follow-up in 12 months.     Medication Adjustments/Labs and Tests Ordered: Current medicines are reviewed at length with the patient today.  Concerns regarding medicines are outlined above.  Orders Placed This Encounter  Procedures   EKG 12-Lead     Meds ordered this encounter  Medications   propranolol ER (INDERAL LA) 80 MG 24 hr capsule    Sig: Take 1 capsule (80 mg total) by mouth 2 (two) times daily.    Dispense:  180 capsule    Refill:  2    Patient Instructions  Medication Instructions:   Your physician recommends that you continue on your current medications as directed. Please refer to the Current Medication list given to you today.  *If you need a refill on your cardiac medications before your next appointment, please call your pharmacy*   Lab Work:  None Ordered  If you have labs (blood work) drawn today and your tests are completely normal, you will receive your results only by: [place] (if you have MyChart) OR A paper copy in the mail If you have any lab test that is abnormal or we need to change your treatment, we will call you to review the results.   Testing/Procedures:  None Ordered    Follow-Up: At [facility], you and your health needs are our priority.  As part of  our continuing mission to provide you with exceptional heart care, we have created designated Provider Care Teams.  These Care Teams include your primary Cardiologist (physician) and Advanced Practice Providers (APPs -  Physician Assistants and Nurse Practitioners) who all work together to provide you with the care you need, when you need it.  We recommend signing up for the patient portal called "MyChart".  Sign up information is provided on this After Visit Summary.  MyChart is used to connect with patients for Virtual Visits (Telemedicine).  Patients are able to view lab/test results, encounter notes, upcoming appointments, etc.  Non-urgent messages can be sent to your provider as well.   To learn more about what you can do with MyChart, go to [email or web address].    Your next appointment:   12 month(s)  Provider:   You may see [name], MD or one of the following Advanced Practice Providers on your designated Care Team:   Murray Hodgkins, NP Christell Faith, PA-C Cadence Kathlen Mody, PA-C Gerrie Nordmann, NP    Signed, [name], MD  09/21/2022 11:11 AM    [place]

## 2022-09-25 ENCOUNTER — Ambulatory Visit: Payer: Medicare PPO | Attending: Neurology

## 2022-09-25 ENCOUNTER — Ambulatory Visit: Payer: Medicare PPO

## 2022-09-25 DIAGNOSIS — R2689 Other abnormalities of gait and mobility: Secondary | ICD-10-CM | POA: Diagnosis not present

## 2022-09-25 DIAGNOSIS — R278 Other lack of coordination: Secondary | ICD-10-CM | POA: Insufficient documentation

## 2022-09-25 DIAGNOSIS — R2681 Unsteadiness on feet: Secondary | ICD-10-CM | POA: Insufficient documentation

## 2022-09-25 DIAGNOSIS — M6281 Muscle weakness (generalized): Secondary | ICD-10-CM | POA: Diagnosis not present

## 2022-09-25 DIAGNOSIS — R269 Unspecified abnormalities of gait and mobility: Secondary | ICD-10-CM | POA: Insufficient documentation

## 2022-09-25 DIAGNOSIS — R262 Difficulty in walking, not elsewhere classified: Secondary | ICD-10-CM

## 2022-09-25 NOTE — Therapy (Signed)
OUTPATIENT PHYSICAL THERAPY TREATMENT NOTE/RECERT   Patient Name: Dustin Gray MRN: [id number] DOB:02/09/1956, 67 y.o., male 10 Date: 09/26/2022  PCP: [name], MD REFERRING PROVIDER: [name], MD   PT End of Session - 09/25/22 1434     Visit Number 124    Number of Visits 148    Date for PT Re-Evaluation 12/18/22    Authorization Type [employer] Auth 11/28/21-02/22/22 for 24 visits    Authorization Time Period 06/26/2022-09/18/2022    Progress Note Due on Visit 130    PT Start Time 1433    PT Stop Time 1514    PT Time Calculation (min) 41 min    Equipment Utilized During Treatment Gait belt    Activity Tolerance Patient tolerated treatment well;No increased pain    Behavior During Therapy WFL for tasks assessed/performed                Past Medical History:  Diagnosis Date   Abscess    groin   Arthritis    lower spine   Erectile dysfunction    Low testosterone    Lumbar herniated disc    L5   Multiple sclerosis    Staph aureus infection    Past Surgical History:  Procedure Laterality Date   CARDIOVERSION N/A 11/22/2021   Procedure: CARDIOVERSION;  Surgeon: [name], MD;  Location: [facility];  Service: Cardiovascular;  Laterality: N/A;   COLONOSCOPY WITH PROPOFOL N/A 07/04/2016   Procedure: COLONOSCOPY WITH PROPOFOL;  Surgeon: [name], MD;  Location: [place];  Service: Endoscopy;  Laterality: N/A;   [place]   POLYPECTOMY  07/04/2016   Procedure: POLYPECTOMY;  Surgeon: [name], MD;  Location: [place];  Service: Endoscopy;;   TONSILLECTOMY     Patient Active Problem List   Diagnosis Date Noted   Persistent atrial fibrillation    Graves disease    Abdominal bloating    Atrial fibrillation with RVR 10/01/2019   Hyperthyroidism    Atrial fibrillation with rapid ventricular response 09/30/2019   Leg edema    Left leg cellulitis    Special screening for  malignant neoplasms, colon    Polyp of sigmoid colon    Benign neoplasm of ascending colon    Rectal polyp    Herniated nucleus pulposus, L5-S1 11/09/2015   Low back pain 10/25/2015   Herniated nucleus pulposus, C5-6 right 10/06/2015   Dysesthesia 09/16/2015   Spasticity 09/16/2015   Unsteady gait 09/16/2015   Multiple sclerosis ([place]) 06/08/2011     REFERRING DIAG: R26.81 (ICD-10-CM) - Unsteadiness on feet   THERAPY DIAG:  Unsteadiness on feet  Difficulty in walking, not elsewhere classified  Abnormality of gait and mobility  Muscle weakness (generalized)  Rationale for Evaluation and Treatment Rehabilitation  PERTINENT HISTORY: Patient presents to physical therapy for unsteadiness, walking instability, fatigue. His PMH includes paroxysmal a fib, MS, lymphedema, hypothyroidism, Graves Disease, arthritis, lumbar herniated disc (L5), depression and neuropathy. Patient first developed symptoms of MS in 2005 and was diagnosed in 2006. Patient has done PT in the past, but hasn't been seen since 2020 at [facility]. Has not been doing his exercises in the past year. Walk outside to garage to ride lawnmower, walks in house. Retired Automotive engineer. Has a rollator at home but doesn't use it in the house   PRECAUTIONS: Fall  SUBJECTIVE: Patient reports leg feel like noodles. Reports having a good holiday weekend but tired. Marland Kitchen  PAIN:  Are you having pain? Back pain none currently    TODAY'S TREATMENT:  THEREX:  Resistive gait - 5# AW x 5 min walk with rollator- good reciprocal steps and good foot clearance- Min VC for posture.   Lunge squat walk with 5# AW in // bars - down and back x 3  Calf raises with 5 # AW 2 sets of 10 reps  Sit to stand 2 sets of 10 reps without UE support  Forward/backward step over 1/2 foam roll x 12 reps each LE  Side step up/over 1/2 foam roll x 12 reps each LE    PATIENT EDUCATION: Education details: exercise technique, body  mechanics Person educated: Patient Education method: Customer service manager, verbal cues Education comprehension: verbalized understanding, returned demonstration    HOME EXERCISE PROGRAM: No changes this session   PT Short Term Goals -       PT SHORT TERM GOAL #1   Title Patient will be independent in home exercise program to improve strength/mobility for better functional independence with ADLs.    Baseline 8/9: HEP to be given next session, 10/31: patient reports compliance with HEP and would like progressions added next session. 08/22/2021=Patient verbalized that he is walking and doing his LE exercises with no questions at this time.    Time 4    Period Weeks    Status Achieved    Target Date 08/16/21              PT Long Term Goals       PT LONG TERM GOAL #1   Title Patient will increase FOTO score to equal to or greater than 70%  to demonstrate statistically significant improvement in mobility and quality of life.    Baseline 8/9: 64%; risk adjusted 34%; 03/21/21 FOTO: 45; 04/24/21 FOTO: 49% ; 08/22/2021= 56% 4/20: 60% 11/28/21: 63%; 01/04/2022= 60%. 03/22/2022= 73%   Time 12    Period Weeks    Status Achieved   Target Date 06/26/2022     PT LONG TERM GOAL #2   Title Patient will increase Berg Balance score by > 6 points to demonstrate decreased fall risk during functional activities.    Baseline 4/20: 43/56 6/6: 48/56; 713/2023= 50/56   Time 12    Period Weeks    Status Achieved   Target Date 03/29/2022     PT LONG TERM GOAL #3   Title Patient will increase 10 meter walk test to >1.47m/s as to improve gait speed for better community ambulation and to reduce fall risk.    Baseline 8/9: 0.65 m/s with rollator; 10/31: 0.68 m/s with rollator. 07/06/2021= 0.71 m/s using rollator; 08/22/2021= 0.94 m/s using rollator. 4/20: 1.1 m/s with rollator    Time 12    Period Weeks    Status Achieved    Target Date 10/11/21      PT LONG TERM GOAL #4   Title Patient will  increase six minute walk test distance to >1000 for progression to community ambulator and improve gait ability    Baseline 8/9: 505 ft with rollator; 03/21/21: 796ft c rollator, 04/24/21: 758ft c rollator; 07/06/2021=900 feet; 08/22/2021= 935 feet with use of Rollator 44/20: 940 ft 11/28/21: 1055 feet with rollator   Time 12    Period Weeks    Status Achieved    Target Date 01/04/22      PT LONG TERM GOAL #5   Title Patient will increase glute medius strength on Left LE from able to lift  off 3" (sidelye on mat) to > 5" to improve stability, gait mechanics, and functional strength.    Baseline 10/31: 3-/5; 07/06/2021= 3-/5; 08/22/2021=3-/5 unable to achieve full ROM yet able to withstand some resistance with testing 4/20: unable to obtainfull ROM against gravity 6/6: unable to move against gravity; 01/04/2022= Continues to have difficulty - unable to raise left LE in sidelye against gravity but able to perform supine left Hip abd with some resistance= 3-/5; 03/22/2022- Patient able to raise his left LE 3 in off mat today. 04/03/2022= Patient able to raise left LE 3" off mat in sidelye today; 05/01/2022= 05/01/2022= patient able to raise left LE from sidelye position= 5"; 06/12/2022- Patient presents with 6.25 in sidelye Left hip abd against gravity. 06/26/2022= 7in in sidelye position.    Time 12    Period Weeks    Status MET   Target Date 06/26/2022     PT LONG TERM GOAL #6   Title Patient will increase Left single leg stance time to 15 seconds or greater to increase safety in shower and independence with ADLs.    Baseline 10/31; 1.83 sec without UE support; 08/22/2021=2-3  sec  on left; 6 sec on right 4/20: RLE 10 seconds LLE unable to perform 6/6: R LE 4 seconds during BERG; 01/04/2022= left = 3 sec and right = 8 sec; 02/13/2022= 20 sec on right at best and 4 sec on left LE at best. 03/23/2022=Patient able to stand on left LE at best for 7 sec today. 04/03/2022= 27 sec on right and 12 sec on left; 06/12/2022= 13  sec on left  and 33 sec at best on right LE; 06/26/2021= 11 sec on left at best and 22 sec on right today; 07/26/2022= 10 sec on left and 20 sec on right today.; 09/04/22: 1-2 seconds on LLE. 27 sec on RLE.    Time 12    Period Weeks    Status On-going    Target Date 09/18/2022     PT LONG TERM GOAL #7   Title Patient will safely negotiate 13 steps with handrails to safely enter/exit mothers house.    Baseline 4/20: very challenging 6/6: able to copmlete safely even managing dogs per pt report; 01/04/2022- Patient performed 16 steps with B Rail-no difficulty other than fatigue.    Time 12    Period Weeks    Status Achieved   Target Date 01/04/22    PT LONG TERM GOAL #8  Title Patient will increase six minute walk test distance to >1100 ft.  for progression to community ambulator and improve gait ability   Baseline 04/03/2022= 920 feet with 4WW - (Added another goal for endurance to continue to progress community amb distances); 05/03/2022= 960 feet with upright 4WW (patient's first time using device): 06/12/2022= 990 feet with 4WW; 06/26/2022= 1010 feet with 4WW; 07/26/2022= 1015 feet; 09/04/22: 1,020'; 09/25/2022= Will reassess next visit.  Time 12   Period Weeks   Status Progressing  Target Date 12/18/2022       PT LONG TERM GOAL #9  Title Patient will be able to perform functional activities such as vacuuming or using backpack sprayer with modified independence for improved household chore abilities   Baseline 06/26/2022= Patient unable to perform either task but has strong desire to return to performing if able. 07/26/2022- Patient has not attempted to vacuum yet and PT session focusing on LE strengthening.; 09/04/22: Able to perform vacuuming tasks mod-I. Has to break up tasks due to limited standing tolerance.  Has not done outdoor tasks due to weather.  09/25/2022= Patient reports he was successful in using backpack sprayer a few weeks ago- tiring but able to perform; has not tried vacuuming yet  Time 12    Period Weeks   Status Ongoing  Target Date 12/18/2022   PT LONG TERM GOAL #10  Title Patient will increase glute medius strength on Left LE from able to lift off 7" (sidelye on mat) to > 10" to improve Hip stability with standing, gait mechanics, and functional strength.   Baseline 06/26/2022= 7in in sidelye position. 07/26/2022- 7.5 in; 09/04/22: 8"; 09/25/2022= not remeasured - will reassess next visit.   Time 12   Period Weeks   Status ONGOING  Target Date 12/18/2022     Plan - 12/13/21 1340     Clinical Impression Statement Patient presents as fatigued from returning from beach trip-Did not assess all goals but will assess remaining goals next visit. He continues to demo progress - able to perform more functional tasks- back pack spraying without LOB. During this cert he has progessed with gait distance in 6 min as well as improved left hip and LE strength. He continues to make slow and steady progress and has room to continue to make progress with skilled PT services aimed at targeting weaker hip musculature. Patient's condition has the potential to improve in response to therapy. Maximum improvement is yet to be obtained. The anticipated improvement is attainable and reasonable in a generally predictable time.  Pt will continue to benefit from skilled PT services to address remaining deficits to maximize his functional independence with mobility and decrease fall risk.    Personal Factors and Comorbidities Age;Comorbidity 3+;Fitness;Past/Current Experience;Time since onset of injury/illness/exacerbation    Comorbidities aroxysmal a fib, MS, lymphedema, hypothyroidism, Graves Disease, arthritis, lumbar herniated disc (L5), depression and neuropathy    Examination-Activity Limitations Bed Mobility;Bend;Caring for Others;Carry;Reach Overhead;Locomotion Level;Lift;Hygiene/Grooming;Dressing;Squat;Stairs;Stand;Toileting;Transfers    Examination-Participation Restrictions Cleaning;Community  Activity;Laundry;Occupation;Shop;Volunteer;Yard Work    [employer]chant [employer] officer Evolving/Moderate complexity    Rehab Potential Fair    PT Frequency 1-2x / week    PT Duration 12 weeks    PT Treatment/Interventions ADLs/Self Care Home Management;Aquatic Therapy;Canalith Repostioning;Biofeedback;Cryotherapy;Ultrasound;Traction;Moist Heat;Electrical Stimulation;DME Instruction;Gait training;Therapeutic exercise;Therapeutic activities;Functional mobility training;Stair training;Balance training;Neuromuscular re-education;Patient/family education;Manual techniques;Orthotic Fit/Training;Compression bandaging;Passive range of motion;Vestibular;Taping;Splinting;Energy conservation;Dry needling;Visual/perceptual remediation/compensation    PT Next Visit Plan Continue with progessive balance training, Progressive LE strenthening as appropriate.    PT Home Exercise Plan Access Code: [email or web address] URL: [email or web address]  Added Bridges with BTB and encouraged more walking around the home during commerical breaks when watching TV.    Consulted and Agree with Plan of Care Patient             Ollen Bowl, PT Physical Therapist- [place]  [facility]

## 2022-10-01 DIAGNOSIS — I890 Lymphedema, not elsewhere classified: Secondary | ICD-10-CM | POA: Diagnosis not present

## 2022-10-01 DIAGNOSIS — I83893 Varicose veins of bilateral lower extremities with other complications: Secondary | ICD-10-CM | POA: Diagnosis not present

## 2022-10-02 ENCOUNTER — Ambulatory Visit: Payer: Medicare PPO

## 2022-10-04 ENCOUNTER — Ambulatory Visit: Payer: Medicare PPO

## 2022-10-04 DIAGNOSIS — R262 Difficulty in walking, not elsewhere classified: Secondary | ICD-10-CM

## 2022-10-04 DIAGNOSIS — R2689 Other abnormalities of gait and mobility: Secondary | ICD-10-CM | POA: Diagnosis not present

## 2022-10-04 DIAGNOSIS — R278 Other lack of coordination: Secondary | ICD-10-CM | POA: Diagnosis not present

## 2022-10-04 DIAGNOSIS — M6281 Muscle weakness (generalized): Secondary | ICD-10-CM | POA: Diagnosis not present

## 2022-10-04 DIAGNOSIS — R2681 Unsteadiness on feet: Secondary | ICD-10-CM | POA: Diagnosis not present

## 2022-10-04 DIAGNOSIS — R269 Unspecified abnormalities of gait and mobility: Secondary | ICD-10-CM | POA: Diagnosis not present

## 2022-10-04 NOTE — Therapy (Signed)
OUTPATIENT PHYSICAL THERAPY TREATMENT NOTE   Patient Name: Dustin Gray MRN: [id number] DOB:December 22, 1955, 67 y.o., male Today's Date: 10/04/2022  PCP: Blair Heys, MD REFERRING PROVIDER: Shirlean Schlein, MD   PT End of Session - 10/04/22 1452     Visit Number 125    Number of Visits 148    Date for PT Re-Evaluation 12/18/22    Authorization Type [employer] Auth 11/28/21-02/22/22 for 24 visits    Authorization Time Period 06/26/2022-09/18/2022    Progress Note Due on Visit 130    PT Start Time 1432    PT Stop Time 1514    PT Time Calculation (min) 42 min    Equipment Utilized During Treatment Gait belt    Activity Tolerance Patient tolerated treatment well;No increased pain    Behavior During Therapy WFL for tasks assessed/performed                 Past Medical History:  Diagnosis Date   Abscess    groin   Arthritis    lower spine   Erectile dysfunction    Low testosterone    Lumbar herniated disc    L5   Multiple sclerosis    Staph aureus infection    Past Surgical History:  Procedure Laterality Date   CARDIOVERSION N/A 11/22/2021   Procedure: CARDIOVERSION;  Surgeon: Debbe Odea, MD;  Location: [facility];  Service: Cardiovascular;  Laterality: N/A;   COLONOSCOPY WITH PROPOFOL N/A 07/04/2016   Procedure: COLONOSCOPY WITH PROPOFOL;  Surgeon: Midge Minium, MD;  Location: [facility];  Service: Endoscopy;  Laterality: N/A;   FINGER SURGERY  1964   MASS EXCISION  1960   POLYPECTOMY  07/04/2016   Procedure: POLYPECTOMY;  Surgeon: Midge Minium, MD;  Location: [facility];  Service: Endoscopy;;   TONSILLECTOMY     Patient Active Problem List   Diagnosis Date Noted   Persistent atrial fibrillation    Graves disease    Abdominal bloating    Atrial fibrillation with RVR 10/01/2019   Hyperthyroidism    Atrial fibrillation with rapid ventricular response 09/30/2019   Leg edema    Left leg cellulitis    Special screening for malignant  neoplasms, colon    Polyp of sigmoid colon    Benign neoplasm of ascending colon    Rectal polyp    Herniated nucleus pulposus, L5-S1 11/09/2015   Low back pain 10/25/2015   Herniated nucleus pulposus, C5-6 right 10/06/2015   Dysesthesia 09/16/2015   Spasticity 09/16/2015   Unsteady gait 09/16/2015   Multiple sclerosis (HCC) 06/08/2011     REFERRING DIAG: R26.81 (ICD-10-CM) - Unsteadiness on feet   THERAPY DIAG:  Unsteadiness on feet  Difficulty in walking, not elsewhere classified  Abnormality of gait and mobility  Muscle weakness (generalized)  Rationale for Evaluation and Treatment Rehabilitation  PERTINENT HISTORY: Patient presents to physical therapy for unsteadiness, walking instability, fatigue. His PMH includes paroxysmal a fib, MS, lymphedema, hypothyroidism, Graves Disease, arthritis, lumbar herniated disc (L5), depression and neuropathy. Patient first developed symptoms of MS in 2005 and was diagnosed in 2006. Patient has done PT in the past, but hasn't been seen since 2020 at [facility]. Has not been doing his exercises in the past year. Walk outside to garage to ride lawnmower, walks in house. Retired Optician, dispensing. Has a rollator at home but doesn't use it in the house   PRECAUTIONS: Fall  SUBJECTIVE: Patient reports leg feel like noodles. Reports having a good holiday weekend but tired. Marland Kitchen  PAIN:  Are you having pain? Back pain none currently    TODAY'S TREATMENT:  THEREX:    6 min walk test- 1063 feet with rollator  Sidelye hip abd- clearing left LE approx 6.5 in- more fatigued x 10 reps- able to clear between 4-6.5 in today. Patient reported more fatigued.   Progressive sit to stand From high surface to lower (4 stages starting high to low) - 5 reps without UE support at each surface height .   Progressive seated hip flex/abd step over hedgehog ball-  (4 stages starting high to low) - 5 reps without UE support at each surface height .   Low  squat hold - 10 sec (4 stages starting high to low) - 5 reps without UE support at each surface height .    PATIENT EDUCATION: Education details: exercise technique, body mechanics Person educated: Patient Education method: Medical illustrator, verbal cues Education comprehension: verbalized understanding, returned demonstration    HOME EXERCISE PROGRAM: No changes this session   PT Short Term Goals -       PT SHORT TERM GOAL #1   Title Patient will be independent in home exercise program to improve strength/mobility for better functional independence with ADLs.    Baseline 8/9: HEP to be given next session, 10/31: patient reports compliance with HEP and would like progressions added next session. 08/22/2021=Patient verbalized that he is walking and doing his LE exercises with no questions at this time.    Time 4    Period Weeks    Status Achieved    Target Date 08/16/21              PT Long Term Goals       PT LONG TERM GOAL #1   Title Patient will increase FOTO score to equal to or greater than 70%  to demonstrate statistically significant improvement in mobility and quality of life.    Baseline 8/9: 64%; risk adjusted 34%; 03/21/21 FOTO: 45; 04/24/21 FOTO: 49% ; 08/22/2021= 56% 4/20: 60% 11/28/21: 63%; 01/04/2022= 60%. 03/22/2022= 73%   Time 12    Period Weeks    Status Achieved   Target Date 06/26/2022     PT LONG TERM GOAL #2   Title Patient will increase Berg Balance score by > 6 points to demonstrate decreased fall risk during functional activities.    Baseline 4/20: 43/56 6/6: 48/56; 713/2023= 50/56   Time 12    Period Weeks    Status Achieved   Target Date 03/29/2022     PT LONG TERM GOAL #3   Title Patient will increase 10 meter walk test to >1.6m/s as to improve gait speed for better community ambulation and to reduce fall risk.    Baseline 8/9: 0.65 m/s with rollator; 10/31: 0.68 m/s with rollator. 07/06/2021= 0.71 m/s using rollator; 08/22/2021= 0.94  m/s using rollator. 4/20: 1.1 m/s with rollator    Time 12    Period Weeks    Status Achieved    Target Date 10/11/21      PT LONG TERM GOAL #4   Title Patient will increase six minute walk test distance to >1000 for progression to community ambulator and improve gait ability    Baseline 8/9: 505 ft with rollator; 03/21/21: 744ft c rollator, 04/24/21: 765ft c rollator; 07/06/2021=900 feet; 08/22/2021= 935 feet with use of Rollator 44/20: 940 ft 11/28/21: 1055 feet with rollator   Time 12    Period Weeks    Status Achieved  Target Date 01/04/22      PT LONG TERM GOAL #5   Title Patient will increase glute medius strength on Left LE from able to lift off 3" (sidelye on mat) to > 5" to improve stability, gait mechanics, and functional strength.    Baseline 10/31: 3-/5; 07/06/2021= 3-/5; 08/22/2021=3-/5 unable to achieve full ROM yet able to withstand some resistance with testing 4/20: unable to obtainfull ROM against gravity 6/6: unable to move against gravity; 01/04/2022= Continues to have difficulty - unable to raise left LE in sidelye against gravity but able to perform supine left Hip abd with some resistance= 3-/5; 03/22/2022- Patient able to raise his left LE 3 in off mat today. 04/03/2022= Patient able to raise left LE 3" off mat in sidelye today; 05/01/2022= 05/01/2022= patient able to raise left LE from sidelye position= 5"; 06/12/2022- Patient presents with 6.25 in sidelye Left hip abd against gravity. 06/26/2022= 7in in sidelye position.    Time 12    Period Weeks    Status MET   Target Date 06/26/2022     PT LONG TERM GOAL #6   Title Patient will increase Left single leg stance time to 15 seconds or greater to increase safety in shower and independence with ADLs.    Baseline 10/31; 1.83 sec without UE support; 08/22/2021=2-3  sec  on left; 6 sec on right 4/20: RLE 10 seconds LLE unable to perform 6/6: R LE 4 seconds during BERG; 01/04/2022= left = 3 sec and right = 8 sec; 02/13/2022= 20 sec on  right at best and 4 sec on left LE at best. 03/23/2022=Patient able to stand on left LE at best for 7 sec today. 04/03/2022= 27 sec on right and 12 sec on left; 06/12/2022= 13 sec on left  and 33 sec at best on right LE; 06/26/2021= 11 sec on left at best and 22 sec on right today; 07/26/2022= 10 sec on left and 20 sec on right today.; 09/04/22: 1-2 seconds on LLE. 27 sec on RLE.    Time 12    Period Weeks    Status On-going    Target Date 09/18/2022     PT LONG TERM GOAL #7   Title Patient will safely negotiate 13 steps with handrails to safely enter/exit mothers house.    Baseline 4/20: very challenging 6/6: able to copmlete safely even managing dogs per pt report; 01/04/2022- Patient performed 16 steps with B Rail-no difficulty other than fatigue.    Time 12    Period Weeks    Status Achieved   Target Date 01/04/22    PT LONG TERM GOAL #8  Title Patient will increase six minute walk test distance to >1100 ft.  for progression to community ambulator and improve gait ability   Baseline 04/03/2022= 920 feet with 4WW - (Added another goal for endurance to continue to progress community amb distances); 05/03/2022= 960 feet with upright 4WW (patient's first time using device): 06/12/2022= 990 feet with 4WW; 06/26/2022= 1010 feet with 4WW; 07/26/2022= 1015 feet; 09/04/22: 1,020'; 09/25/2022= Will reassess next visit. 10/04/2022= 1063"  Time 12   Period Weeks   Status Progressing  Target Date 12/18/2022       PT LONG TERM GOAL #9  Title Patient will be able to perform functional activities such as vacuuming or using backpack sprayer with modified independence for improved household chore abilities   Baseline 06/26/2022= Patient unable to perform either task but has strong desire to return to performing if able. 07/26/2022-  Patient has not attempted to vacuum yet and PT session focusing on LE strengthening.; 09/04/22: Able to perform vacuuming tasks mod-I. Has to break up tasks due to limited standing tolerance. Has  not done outdoor tasks due to weather.  09/25/2022= Patient reports he was successful in vacuuming a few weeks ago- tiring but able to perform; has not tried spraying or weedeating yet.  Time 12   Period Weeks   Status Ongoing  Target Date 12/18/2022   PT LONG TERM GOAL #10  Title Patient will increase glute medius strength on Left LE from able to lift off 7" (sidelye on mat) to > 10" to improve Hip stability with standing, gait mechanics, and functional strength.   Baseline 06/26/2022= 7in in sidelye position. 07/26/2022- 7.5 in; 09/04/22: 8"; 09/25/2022= not remeasured - will reassess next visit.   Time 12   Period Weeks   Status ONGOING  Target Date 12/18/2022     Plan - 12/13/21 1340     Clinical Impression Statement Patient performed well overall today. He was able to demo steady progress with 6 min walk test but overall fatigue and then he struggled some later in visit with testing his abd strength. May test again next 1-2 visits when he is fresher and not fatigued from increased walking. Pt will continue to benefit from skilled PT services to address remaining deficits to maximize his functional independence with mobility and decrease fall risk.    Personal Factors and Comorbidities Age;Comorbidity 3+;Fitness;Past/Current Experience;Time since onset of injury/illness/exacerbation    Comorbidities aroxysmal a fib, MS, lymphedema, hypothyroidism, Graves Disease, arthritis, lumbar herniated disc (L5), depression and neuropathy    Examination-Activity Limitations Bed Mobility;Bend;Caring for Others;Carry;Reach Overhead;Locomotion Level;Lift;Hygiene/Grooming;Dressing;Squat;Stairs;Stand;Toileting;Transfers    Examination-Participation Restrictions Cleaning;Community Activity;Laundry;Occupation;Shop;Volunteer;Yard Work    Conservation officer, historic buildingstability/Clinical Decision Making Evolving/Moderate complexity    Rehab Potential Fair    PT Frequency 1-2x / week    PT Duration 12 weeks    PT Treatment/Interventions ADLs/Self  Care Home Management;Aquatic Therapy;Canalith Repostioning;Biofeedback;Cryotherapy;Ultrasound;Traction;Moist Heat;Electrical Stimulation;DME Instruction;Gait training;Therapeutic exercise;Therapeutic activities;Functional mobility training;Stair training;Balance training;Neuromuscular re-education;Patient/family education;Manual techniques;Orthotic Fit/Training;Compression bandaging;Passive range of motion;Vestibular;Taping;Splinting;Energy conservation;Dry needling;Visual/perceptual remediation/compensation    PT Next Visit Plan Continue with progessive balance training, Progressive LE strenthening as appropriate.    PT Home Exercise Plan Access Code: [email or web address] URL: [email or web address]  Added Bridges with BTB and encouraged more walking around the home during commerical breaks when watching TV.    Consulted and Agree with Plan of Care Patient             Louis MeckelJeff Gratia Disla, PT Physical Therapist- [place]  [facility]

## 2022-10-09 ENCOUNTER — Ambulatory Visit: Payer: Medicare PPO

## 2022-10-09 DIAGNOSIS — R262 Difficulty in walking, not elsewhere classified: Secondary | ICD-10-CM | POA: Diagnosis not present

## 2022-10-09 DIAGNOSIS — R269 Unspecified abnormalities of gait and mobility: Secondary | ICD-10-CM

## 2022-10-09 DIAGNOSIS — M6281 Muscle weakness (generalized): Secondary | ICD-10-CM

## 2022-10-09 DIAGNOSIS — R2681 Unsteadiness on feet: Secondary | ICD-10-CM | POA: Diagnosis not present

## 2022-10-09 DIAGNOSIS — R2689 Other abnormalities of gait and mobility: Secondary | ICD-10-CM

## 2022-10-09 DIAGNOSIS — R278 Other lack of coordination: Secondary | ICD-10-CM | POA: Diagnosis not present

## 2022-10-09 NOTE — Therapy (Signed)
OUTPATIENT PHYSICAL THERAPY TREATMENT NOTE   Patient Name: Dustin Gray MRN: [id number] DOB:1955/10/08, 67 y.o., male Today's Date: 10/09/2022  PCP: Blair Heys, MD REFERRING PROVIDER: Shirlean Schlein, MD   PT End of Session - 10/09/22 1444     Visit Number 126    Number of Visits 148    Date for PT Re-Evaluation 12/18/22    Authorization Type [employer] Auth 11/28/21-02/22/22 for 24 visits    Progress Note Due on Visit 130    PT Start Time 1435    PT Stop Time 1515    PT Time Calculation (min) 40 min    Equipment Utilized During Treatment Gait belt    Activity Tolerance Patient tolerated treatment well;No increased pain                 Past Medical History:  Diagnosis Date   Abscess    groin   Arthritis    lower spine   Erectile dysfunction    Low testosterone    Lumbar herniated disc    L5   Multiple sclerosis    Staph aureus infection    Past Surgical History:  Procedure Laterality Date   CARDIOVERSION N/A 11/22/2021   Procedure: CARDIOVERSION;  Surgeon: Debbe Odea, MD;  Location: [facility];  Service: Cardiovascular;  Laterality: N/A;   COLONOSCOPY WITH PROPOFOL N/A 07/04/2016   Procedure: COLONOSCOPY WITH PROPOFOL;  Surgeon: Midge Minium, MD;  Location: [facility];  Service: Endoscopy;  Laterality: N/A;   FINGER SURGERY  1964   MASS EXCISION  1960   POLYPECTOMY  07/04/2016   Procedure: POLYPECTOMY;  Surgeon: Midge Minium, MD;  Location: [facility];  Service: Endoscopy;;   TONSILLECTOMY     Patient Active Problem List   Diagnosis Date Noted   Persistent atrial fibrillation    Graves disease    Abdominal bloating    Atrial fibrillation with RVR 10/01/2019   Hyperthyroidism    Atrial fibrillation with rapid ventricular response 09/30/2019   Leg edema    Left leg cellulitis    Special screening for malignant neoplasms, colon    Polyp of sigmoid colon    Benign neoplasm of ascending colon    Rectal polyp     Herniated nucleus pulposus, L5-S1 11/09/2015   Low back pain 10/25/2015   Herniated nucleus pulposus, C5-6 right 10/06/2015   Dysesthesia 09/16/2015   Spasticity 09/16/2015   Unsteady gait 09/16/2015   Multiple sclerosis (HCC) 06/08/2011     REFERRING DIAG: R26.81 (ICD-10-CM) - Unsteadiness on feet   THERAPY DIAG:  Unsteadiness on feet  Difficulty in walking, not elsewhere classified  Abnormality of gait and mobility  Muscle weakness (generalized)  Rationale for Evaluation and Treatment Rehabilitation  PERTINENT HISTORY: Patient presents to physical therapy for unsteadiness, walking instability, fatigue. His PMH includes paroxysmal a fib, MS, lymphedema, hypothyroidism, Graves Disease, arthritis, lumbar herniated disc (L5), depression and neuropathy. Patient first developed symptoms of MS in 2005 and was diagnosed in 2006. Patient has done PT in the past, but hasn't been seen since 2020 at [facility]. Has not been doing his exercises in the past year. Walk outside to garage to ride lawnmower, walks in house. Retired Optician, dispensing. Has a rollator at home but doesn't use it in the house   PRECAUTIONS: Fall  SUBJECTIVE: Pt reports partial improvement in energy levels since previous visit. He continues to adjust to his new daily routine with his wife back at the house.    PAIN:  Are  you having pain? None at present     TODAY'S TREATMENT:  THEREX:  -STS from chair + airex x15, hands free *seated recovery interval  -overground AMB 653ft, pt's 4WW, 4:45s *seated recovery interval  -STS from chair + airex 1x10 c orange ball at sternum  *seated recovery interval  -overground AMB 629ft, pt's 4WW, 4:46s *seated recovery interval  -STS from chair + airex 1x10 c orange ball at sternum    PATIENT EDUCATION: Education details: exercise technique, body mechanics Person educated: Patient Education method: Medical illustrator, verbal cues Education comprehension:  verbalized understanding, returned demonstration    HOME EXERCISE PROGRAM: No changes this session   PT Short Term Goals -       PT SHORT TERM GOAL #1   Title Patient will be independent in home exercise program to improve strength/mobility for better functional independence with ADLs.    Baseline 8/9: HEP to be given next session, 10/31: patient reports compliance with HEP and would like progressions added next session. 08/22/2021=Patient verbalized that he is walking and doing his LE exercises with no questions at this time.    Time 4    Period Weeks    Status Achieved    Target Date 08/16/21              PT Long Term Goals       PT LONG TERM GOAL #1   Title Patient will increase FOTO score to equal to or greater than 70%  to demonstrate statistically significant improvement in mobility and quality of life.    Baseline 8/9: 64%; risk adjusted 34%; 03/21/21 FOTO: 45; 04/24/21 FOTO: 49% ; 08/22/2021= 56% 4/20: 60% 11/28/21: 63%; 01/04/2022= 60%. 03/22/2022= 73%   Time 12    Period Weeks    Status Achieved   Target Date 06/26/2022     PT LONG TERM GOAL #2   Title Patient will increase Berg Balance score by > 6 points to demonstrate decreased fall risk during functional activities.    Baseline 4/20: 43/56 6/6: 48/56; 713/2023= 50/56   Time 12    Period Weeks    Status Achieved   Target Date 03/29/2022     PT LONG TERM GOAL #3   Title Patient will increase 10 meter walk test to >1.63m/s as to improve gait speed for better community ambulation and to reduce fall risk.    Baseline 8/9: 0.65 m/s with rollator; 10/31: 0.68 m/s with rollator. 07/06/2021= 0.71 m/s using rollator; 08/22/2021= 0.94 m/s using rollator. 4/20: 1.1 m/s with rollator    Time 12    Period Weeks    Status Achieved    Target Date 10/11/21      PT LONG TERM GOAL #4   Title Patient will increase six minute walk test distance to >1000 for progression to community ambulator and improve gait ability    Baseline 8/9:  505 ft with rollator; 03/21/21: 753ft c rollator, 04/24/21: 750ft c rollator; 07/06/2021=900 feet; 08/22/2021= 935 feet with use of Rollator 44/20: 940 ft 11/28/21: 1055 feet with rollator   Time 12    Period Weeks    Status Achieved    Target Date 01/04/22      PT LONG TERM GOAL #5   Title Patient will increase glute medius strength on Left LE from able to lift off 3" (sidelye on mat) to > 5" to improve stability, gait mechanics, and functional strength.    Baseline 10/31: 3-/5; 07/06/2021= 3-/5; 08/22/2021=3-/5 unable to achieve full ROM yet  able to withstand some resistance with testing 4/20: unable to obtainfull ROM against gravity 6/6: unable to move against gravity; 01/04/2022= Continues to have difficulty - unable to raise left LE in sidelye against gravity but able to perform supine left Hip abd with some resistance= 3-/5; 03/22/2022- Patient able to raise his left LE 3 in off mat today. 04/03/2022= Patient able to raise left LE 3" off mat in sidelye today; 05/01/2022= 05/01/2022= patient able to raise left LE from sidelye position= 5"; 06/12/2022- Patient presents with 6.25 in sidelye Left hip abd against gravity. 06/26/2022= 7in in sidelye position.    Time 12    Period Weeks    Status MET   Target Date 06/26/2022     PT LONG TERM GOAL #6   Title Patient will increase Left single leg stance time to 15 seconds or greater to increase safety in shower and independence with ADLs.    Baseline 10/31; 1.83 sec without UE support; 08/22/2021=2-3  sec  on left; 6 sec on right 4/20: RLE 10 seconds LLE unable to perform 6/6: R LE 4 seconds during BERG; 01/04/2022= left = 3 sec and right = 8 sec; 02/13/2022= 20 sec on right at best and 4 sec on left LE at best. 03/23/2022=Patient able to stand on left LE at best for 7 sec today. 04/03/2022= 27 sec on right and 12 sec on left; 06/12/2022= 13 sec on left  and 33 sec at best on right LE; 06/26/2021= 11 sec on left at best and 22 sec on right today; 07/26/2022= 10 sec on left  and 20 sec on right today.; 09/04/22: 1-2 seconds on LLE. 27 sec on RLE.    Time 12    Period Weeks    Status On-going    Target Date 09/18/2022     PT LONG TERM GOAL #7   Title Patient will safely negotiate 13 steps with handrails to safely enter/exit mothers house.    Baseline 4/20: very challenging 6/6: able to copmlete safely even managing dogs per pt report; 01/04/2022- Patient performed 16 steps with B Rail-no difficulty other than fatigue.    Time 12    Period Weeks    Status Achieved   Target Date 01/04/22    PT LONG TERM GOAL #8  Title Patient will increase six minute walk test distance to >1100 ft.  for progression to community ambulator and improve gait ability   Baseline 04/03/2022= 920 feet with 4WW - (Added another goal for endurance to continue to progress community amb distances); 05/03/2022= 960 feet with upright 4WW (patient's first time using device): 06/12/2022= 990 feet with 4WW; 06/26/2022= 1010 feet with 4WW; 07/26/2022= 1015 feet; 09/04/22: 1,020'; 09/25/2022= Will reassess next visit. 10/04/2022= 1063"  Time 12   Period Weeks   Status Progressing  Target Date 12/18/2022       PT LONG TERM GOAL #9  Title Patient will be able to perform functional activities such as vacuuming or using backpack sprayer with modified independence for improved household chore abilities   Baseline 06/26/2022= Patient unable to perform either task but has strong desire to return to performing if able. 07/26/2022- Patient has not attempted to vacuum yet and PT session focusing on LE strengthening.; 09/04/22: Able to perform vacuuming tasks mod-I. Has to break up tasks due to limited standing tolerance. Has not done outdoor tasks due to weather.  09/25/2022= Patient reports he was successful in vacuuming a few weeks ago- tiring but able to perform; has not tried  spraying or weedeating yet.  Time 12   Period Weeks   Status Ongoing  Target Date 12/18/2022   PT LONG TERM GOAL #10  Title Patient will  increase glute medius strength on Left LE from able to lift off 7" (sidelye on mat) to > 10" to improve Hip stability with standing, gait mechanics, and functional strength.   Baseline 06/26/2022= 7in in sidelye position. 07/26/2022- 7.5 in; 09/04/22: 8"; 09/25/2022= not remeasured - will reassess next visit.   Time 12   Period Weeks   Status ONGOING  Target Date 12/18/2022     Plan - 12/13/21 1340     Clinical Impression Statement Circuit based training today, alternating sustained AMB and repeat STS transfers, breaks taken between episodes to allow recovery after exertion. No physical assist needed for any mobility in session. Pt maintains balance with ad lib use of 4WW. Pt will continue to benefit from skilled PT services to address remaining deficits to maximize his functional independence with mobility and decrease fall risk.    Personal Factors and Comorbidities Age;Comorbidity 3+;Fitness;Past/Current Experience;Time since onset of injury/illness/exacerbation    Comorbidities aroxysmal a fib, MS, lymphedema, hypothyroidism, Graves Disease, arthritis, lumbar herniated disc (L5), depression and neuropathy    Examination-Activity Limitations Bed Mobility;Bend;Caring for Others;Carry;Reach Overhead;Locomotion Level;Lift;Hygiene/Grooming;Dressing;Squat;Stairs;Stand;Toileting;Transfers    Examination-Participation Restrictions Cleaning;Community Activity;Laundry;Occupation;Shop;Volunteer;Yard Work    Conservation officer, historic buildings Evolving/Moderate complexity    Rehab Potential Fair    PT Frequency 1-2x / week    PT Duration 12 weeks    PT Treatment/Interventions ADLs/Self Care Home Management;Aquatic Therapy;Canalith Repostioning;Biofeedback;Cryotherapy;Ultrasound;Traction;Moist Heat;Electrical Stimulation;DME Instruction;Gait training;Therapeutic exercise;Therapeutic activities;Functional mobility training;Stair training;Balance training;Neuromuscular re-education;Patient/family education;Manual  techniques;Orthotic Fit/Training;Compression bandaging;Passive range of motion;Vestibular;Taping;Splinting;Energy conservation;Dry needling;Visual/perceptual remediation/compensation    PT Next Visit Plan Continue with progessive balance training, Progressive LE strenthening as appropriate.    PT Home Exercise Plan Access Code: [email or web address] URL: [email or web address]  Added Bridges with BTB and encouraged more walking around the home during commerical breaks when watching TV.    Consulted and Agree with Plan of Care Patient            2:47 PM, 10/09/22 Rosamaria Lints, PT, DPT Physical Therapist - [facility]  Outpatient Physical Therapy- Main Campus [phone]

## 2022-10-11 ENCOUNTER — Ambulatory Visit: Payer: Medicare PPO

## 2022-10-11 DIAGNOSIS — R269 Unspecified abnormalities of gait and mobility: Secondary | ICD-10-CM | POA: Diagnosis not present

## 2022-10-11 DIAGNOSIS — R2681 Unsteadiness on feet: Secondary | ICD-10-CM | POA: Diagnosis not present

## 2022-10-11 DIAGNOSIS — M6281 Muscle weakness (generalized): Secondary | ICD-10-CM | POA: Diagnosis not present

## 2022-10-11 DIAGNOSIS — R2689 Other abnormalities of gait and mobility: Secondary | ICD-10-CM | POA: Diagnosis not present

## 2022-10-11 DIAGNOSIS — R262 Difficulty in walking, not elsewhere classified: Secondary | ICD-10-CM

## 2022-10-11 DIAGNOSIS — R278 Other lack of coordination: Secondary | ICD-10-CM | POA: Diagnosis not present

## 2022-10-11 NOTE — Therapy (Signed)
OUTPATIENT PHYSICAL THERAPY TREATMENT NOTE   Patient Name: Dustin Gray MRN: [id number] DOB:Jun 17, 1956, 67 y.o., male Today's Date: 10/11/2022  PCP: Blair Heys, MD REFERRING PROVIDER: Shirlean Schlein, MD         Past Medical History:  Diagnosis Date   Abscess    groin   Arthritis    lower spine   Erectile dysfunction    Low testosterone    Lumbar herniated disc    L5   Multiple sclerosis    Staph aureus infection    Past Surgical History:  Procedure Laterality Date   CARDIOVERSION N/A 11/22/2021   Procedure: CARDIOVERSION;  Surgeon: Debbe Odea, MD;  Location: [facility];  Service: Cardiovascular;  Laterality: N/A;   COLONOSCOPY WITH PROPOFOL N/A 07/04/2016   Procedure: COLONOSCOPY WITH PROPOFOL;  Surgeon: Midge Minium, MD;  Location: [facility];  Service: Endoscopy;  Laterality: N/A;   FINGER SURGERY  1964   MASS EXCISION  1960   POLYPECTOMY  07/04/2016   Procedure: POLYPECTOMY;  Surgeon: Midge Minium, MD;  Location: [facility];  Service: Endoscopy;;   TONSILLECTOMY     Patient Active Problem List   Diagnosis Date Noted   Persistent atrial fibrillation    Graves disease    Abdominal bloating    Atrial fibrillation with RVR 10/01/2019   Hyperthyroidism    Atrial fibrillation with rapid ventricular response 09/30/2019   Leg edema    Left leg cellulitis    Special screening for malignant neoplasms, colon    Polyp of sigmoid colon    Benign neoplasm of ascending colon    Rectal polyp    Herniated nucleus pulposus, L5-S1 11/09/2015   Low back pain 10/25/2015   Herniated nucleus pulposus, C5-6 right 10/06/2015   Dysesthesia 09/16/2015   Spasticity 09/16/2015   Unsteady gait 09/16/2015   Multiple sclerosis (HCC) 06/08/2011     REFERRING DIAG: R26.81 (ICD-10-CM) - Unsteadiness on feet   THERAPY DIAG:  Unsteadiness on feet  Difficulty in walking, not elsewhere classified  Abnormality of gait and mobility  Muscle  weakness (generalized)  Rationale for Evaluation and Treatment Rehabilitation  PERTINENT HISTORY: Patient presents to physical therapy for unsteadiness, walking instability, fatigue. His PMH includes paroxysmal a fib, MS, lymphedema, hypothyroidism, Graves Disease, arthritis, lumbar herniated disc (L5), depression and neuropathy. Patient first developed symptoms of MS in 2005 and was diagnosed in 2006. Patient has done PT in the past, but hasn't been seen since 2020 at [facility]. Has not been doing his exercises in the past year. Walk outside to garage to ride lawnmower, walks in house. Retired Optician, dispensing. Has a rollator at home but doesn't use it in the house   PRECAUTIONS: Fall  SUBJECTIVE: Pt reports still feeling some increased weakness/fatigue in left LE but did state able to perform some weed eating- Taxing effort but was successful in short duration with no reported falls  PAIN:  Are you having pain? None at present     TODAY'S TREATMENT:  THEREX:   Circuit style workout   -Standing hip march with 4# AW - 2 sets of 12 reps -Standing hip abd- 4# AW- 2 sets of 12 reps - Ambulation with Rollator and 4# AW x 160 feet    --STS from chair + airex 1x10 c orange ball at sternum  - Standing calf raises 4# AW   - Ambulation with Rollator and 4# AW x 160 feet   - Seated LAQ 4# AW 2 sets of 12 reps  -  Standing Ham curls 4# 2 sets of 12 reps    PATIENT EDUCATION: Education details: exercise technique, body mechanics Person educated: Patient Education method: Medical illustrator, verbal cues Education comprehension: verbalized understanding, returned demonstration    HOME EXERCISE PROGRAM: No changes this session   PT Short Term Goals -       PT SHORT TERM GOAL #1   Title Patient will be independent in home exercise program to improve strength/mobility for better functional independence with ADLs.    Baseline 8/9: HEP to be given next session, 10/31:  patient reports compliance with HEP and would like progressions added next session. 08/22/2021=Patient verbalized that he is walking and doing his LE exercises with no questions at this time.    Time 4    Period Weeks    Status Achieved    Target Date 08/16/21              PT Long Term Goals       PT LONG TERM GOAL #1   Title Patient will increase FOTO score to equal to or greater than 70%  to demonstrate statistically significant improvement in mobility and quality of life.    Baseline 8/9: 64%; risk adjusted 34%; 03/21/21 FOTO: 45; 04/24/21 FOTO: 49% ; 08/22/2021= 56% 4/20: 60% 11/28/21: 63%; 01/04/2022= 60%. 03/22/2022= 73%   Time 12    Period Weeks    Status Achieved   Target Date 06/26/2022     PT LONG TERM GOAL #2   Title Patient will increase Berg Balance score by > 6 points to demonstrate decreased fall risk during functional activities.    Baseline 4/20: 43/56 6/6: 48/56; 713/2023= 50/56   Time 12    Period Weeks    Status Achieved   Target Date 03/29/2022     PT LONG TERM GOAL #3   Title Patient will increase 10 meter walk test to >1.71m/s as to improve gait speed for better community ambulation and to reduce fall risk.    Baseline 8/9: 0.65 m/s with rollator; 10/31: 0.68 m/s with rollator. 07/06/2021= 0.71 m/s using rollator; 08/22/2021= 0.94 m/s using rollator. 4/20: 1.1 m/s with rollator    Time 12    Period Weeks    Status Achieved    Target Date 10/11/21      PT LONG TERM GOAL #4   Title Patient will increase six minute walk test distance to >1000 for progression to community ambulator and improve gait ability    Baseline 8/9: 505 ft with rollator; 03/21/21: 756ft c rollator, 04/24/21: 731ft c rollator; 07/06/2021=900 feet; 08/22/2021= 935 feet with use of Rollator 44/20: 940 ft 11/28/21: 1055 feet with rollator   Time 12    Period Weeks    Status Achieved    Target Date 01/04/22      PT LONG TERM GOAL #5   Title Patient will increase glute medius strength on Left LE  from able to lift off 3" (sidelye on mat) to > 5" to improve stability, gait mechanics, and functional strength.    Baseline 10/31: 3-/5; 07/06/2021= 3-/5; 08/22/2021=3-/5 unable to achieve full ROM yet able to withstand some resistance with testing 4/20: unable to obtainfull ROM against gravity 6/6: unable to move against gravity; 01/04/2022= Continues to have difficulty - unable to raise left LE in sidelye against gravity but able to perform supine left Hip abd with some resistance= 3-/5; 03/22/2022- Patient able to raise his left LE 3 in off mat today. 04/03/2022= Patient able to raise  left LE 3" off mat in sidelye today; 05/01/2022= 05/01/2022= patient able to raise left LE from sidelye position= 5"; 06/12/2022- Patient presents with 6.25 in sidelye Left hip abd against gravity. 06/26/2022= 7in in sidelye position.    Time 12    Period Weeks    Status MET   Target Date 06/26/2022     PT LONG TERM GOAL #6   Title Patient will increase Left single leg stance time to 15 seconds or greater to increase safety in shower and independence with ADLs.    Baseline 10/31; 1.83 sec without UE support; 08/22/2021=2-3  sec  on left; 6 sec on right 4/20: RLE 10 seconds LLE unable to perform 6/6: R LE 4 seconds during BERG; 01/04/2022= left = 3 sec and right = 8 sec; 02/13/2022= 20 sec on right at best and 4 sec on left LE at best. 03/23/2022=Patient able to stand on left LE at best for 7 sec today. 04/03/2022= 27 sec on right and 12 sec on left; 06/12/2022= 13 sec on left  and 33 sec at best on right LE; 06/26/2021= 11 sec on left at best and 22 sec on right today; 07/26/2022= 10 sec on left and 20 sec on right today.; 09/04/22: 1-2 seconds on LLE. 27 sec on RLE.    Time 12    Period Weeks    Status On-going    Target Date 09/18/2022     PT LONG TERM GOAL #7   Title Patient will safely negotiate 13 steps with handrails to safely enter/exit mothers house.    Baseline 4/20: very challenging 6/6: able to copmlete safely even  managing dogs per pt report; 01/04/2022- Patient performed 16 steps with B Rail-no difficulty other than fatigue.    Time 12    Period Weeks    Status Achieved   Target Date 01/04/22    PT LONG TERM GOAL #8  Title Patient will increase six minute walk test distance to >1100 ft.  for progression to community ambulator and improve gait ability   Baseline 04/03/2022= 920 feet with 4WW - (Added another goal for endurance to continue to progress community amb distances); 05/03/2022= 960 feet with upright 4WW (patient's first time using device): 06/12/2022= 990 feet with 4WW; 06/26/2022= 1010 feet with 4WW; 07/26/2022= 1015 feet; 09/04/22: 1,020'; 09/25/2022= Will reassess next visit. 10/04/2022= 1063"  Time 12   Period Weeks   Status Progressing  Target Date 12/18/2022       PT LONG TERM GOAL #9  Title Patient will be able to perform functional activities such as vacuuming or using backpack sprayer with modified independence for improved household chore abilities   Baseline 06/26/2022= Patient unable to perform either task but has strong desire to return to performing if able. 07/26/2022- Patient has not attempted to vacuum yet and PT session focusing on LE strengthening.; 09/04/22: Able to perform vacuuming tasks mod-I. Has to break up tasks due to limited standing tolerance. Has not done outdoor tasks due to weather.  09/25/2022= Patient reports he was successful in vacuuming a few weeks ago- tiring but able to perform; has not tried spraying or weedeating yet.  Time 12   Period Weeks   Status Ongoing  Target Date 12/18/2022   PT LONG TERM GOAL #10  Title Patient will increase glute medius strength on Left LE from able to lift off 7" (sidelye on mat) to > 10" to improve Hip stability with standing, gait mechanics, and functional strength.   Baseline 06/26/2022= 7in  in sidelye position. 07/26/2022- 7.5 in; 09/04/22: 8"; 09/25/2022= not remeasured - will reassess next visit.   Time 12   Period Weeks   Status ONGOING   Target Date 12/18/2022     Plan - 12/13/21 1340     Clinical Impression Statement Treatment continued with circuit based training today, alternating  AMB and some form of LE strengthening exercise, breaks taken between episodes to allow recovery after exertion. Patient was challenged with all Left LE exercises but pushed hard to complete reps and no obvious dragging of left foot with resistive gait today. Pt will continue to benefit from skilled PT services to address remaining deficits to maximize his functional independence with mobility and decrease fall risk.    Personal Factors and Comorbidities Age;Comorbidity 3+;Fitness;Past/Current Experience;Time since onset of injury/illness/exacerbation    Comorbidities aroxysmal a fib, MS, lymphedema, hypothyroidism, Graves Disease, arthritis, lumbar herniated disc (L5), depression and neuropathy    Examination-Activity Limitations Bed Mobility;Bend;Caring for Others;Carry;Reach Overhead;Locomotion Level;Lift;Hygiene/Grooming;Dressing;Squat;Stairs;Stand;Toileting;Transfers    Examination-Participation Restrictions Cleaning;Community Activity;Laundry;Occupation;Shop;Volunteer;Yard Work    Conservation officer, historic buildings Evolving/Moderate complexity    Rehab Potential Fair    PT Frequency 1-2x / week    PT Duration 12 weeks    PT Treatment/Interventions ADLs/Self Care Home Management;Aquatic Therapy;Canalith Repostioning;Biofeedback;Cryotherapy;Ultrasound;Traction;Moist Heat;Electrical Stimulation;DME Instruction;Gait training;Therapeutic exercise;Therapeutic activities;Functional mobility training;Stair training;Balance training;Neuromuscular re-education;Patient/family education;Manual techniques;Orthotic Fit/Training;Compression bandaging;Passive range of motion;Vestibular;Taping;Splinting;Energy conservation;Dry needling;Visual/perceptual remediation/compensation    PT Next Visit Plan Continue with progessive balance training, Progressive LE  strenthening as appropriate.    PT Home Exercise Plan Access Code: [email or web address] URL: [email or web address]  Added Bridges with BTB and encouraged more walking around the home during commerical breaks when watching TV.    Consulted and Agree with Plan of Care Patient            1:03 PM, 10/11/22 Louis Meckel, PT Physical Therapist - [facility] [phone]

## 2022-10-12 DIAGNOSIS — I890 Lymphedema, not elsewhere classified: Secondary | ICD-10-CM | POA: Diagnosis not present

## 2022-10-16 ENCOUNTER — Ambulatory Visit: Payer: Medicare PPO

## 2022-10-16 DIAGNOSIS — R269 Unspecified abnormalities of gait and mobility: Secondary | ICD-10-CM

## 2022-10-16 DIAGNOSIS — R262 Difficulty in walking, not elsewhere classified: Secondary | ICD-10-CM

## 2022-10-16 DIAGNOSIS — R2681 Unsteadiness on feet: Secondary | ICD-10-CM | POA: Diagnosis not present

## 2022-10-16 DIAGNOSIS — M6281 Muscle weakness (generalized): Secondary | ICD-10-CM | POA: Diagnosis not present

## 2022-10-16 DIAGNOSIS — R278 Other lack of coordination: Secondary | ICD-10-CM | POA: Diagnosis not present

## 2022-10-16 DIAGNOSIS — R2689 Other abnormalities of gait and mobility: Secondary | ICD-10-CM | POA: Diagnosis not present

## 2022-10-16 NOTE — Therapy (Signed)
OUTPATIENT PHYSICAL THERAPY TREATMENT NOTE   Patient Name: Dustin Gray MRN: [id number] DOB:March 30, 1956, 67 y.o., male Today's Date: 10/16/2022  PCP: Blair Heys, MD REFERRING PROVIDER: Shirlean Schlein, MD   PT End of Session - 10/16/22 1440     Visit Number 128    Number of Visits 148    Date for PT Re-Evaluation 12/18/22    Authorization Type [employer] Auth 11/28/21-02/22/22 for 24 visits    Progress Note Due on Visit 130    PT Start Time 1436    PT Stop Time 1515    PT Time Calculation (min) 39 min    Equipment Utilized During Treatment Gait belt    Activity Tolerance Patient tolerated treatment well;No increased pain                  Past Medical History:  Diagnosis Date   Abscess    groin   Arthritis    lower spine   Erectile dysfunction    Low testosterone    Lumbar herniated disc    L5   Multiple sclerosis    Staph aureus infection    Past Surgical History:  Procedure Laterality Date   CARDIOVERSION N/A 11/22/2021   Procedure: CARDIOVERSION;  Surgeon: Debbe Odea, MD;  Location: [facility];  Service: Cardiovascular;  Laterality: N/A;   COLONOSCOPY WITH PROPOFOL N/A 07/04/2016   Procedure: COLONOSCOPY WITH PROPOFOL;  Surgeon: Midge Minium, MD;  Location: [facility];  Service: Endoscopy;  Laterality: N/A;   FINGER SURGERY  1964   MASS EXCISION  1960   POLYPECTOMY  07/04/2016   Procedure: POLYPECTOMY;  Surgeon: Midge Minium, MD;  Location: [facility];  Service: Endoscopy;;   TONSILLECTOMY     Patient Active Problem List   Diagnosis Date Noted   Persistent atrial fibrillation    Graves disease    Abdominal bloating    Atrial fibrillation with RVR 10/01/2019   Hyperthyroidism    Atrial fibrillation with rapid ventricular response 09/30/2019   Leg edema    Left leg cellulitis    Special screening for malignant neoplasms, colon    Polyp of sigmoid colon    Benign neoplasm of ascending colon    Rectal polyp     Herniated nucleus pulposus, L5-S1 11/09/2015   Low back pain 10/25/2015   Herniated nucleus pulposus, C5-6 right 10/06/2015   Dysesthesia 09/16/2015   Spasticity 09/16/2015   Unsteady gait 09/16/2015   Multiple sclerosis (HCC) 06/08/2011     REFERRING DIAG: R26.81 (ICD-10-CM) - Unsteadiness on feet   THERAPY DIAG:  Unsteadiness on feet  Difficulty in walking, not elsewhere classified  Abnormality of gait and mobility  Muscle weakness (generalized)  Rationale for Evaluation and Treatment Rehabilitation  PERTINENT HISTORY: Patient presents to physical therapy for unsteadiness, walking instability, fatigue. His PMH includes paroxysmal a fib, MS, lymphedema, hypothyroidism, Graves Disease, arthritis, lumbar herniated disc (L5), depression and neuropathy. Patient first developed symptoms of MS in 2005 and was diagnosed in 2006. Patient has done PT in the past, but hasn't been seen since 2020 at [facility]. Has not been doing his exercises in the past year. Walk outside to garage to ride lawnmower, walks in house. Retired Optician, dispensing. Has a rollator at home but doesn't use it in the house   PRECAUTIONS: Fall  SUBJECTIVE: Patient reports feeling tired overall today with no specific complaints.   PAIN:  Are you having pain? None at present     TODAY'S TREATMENT:  THEREX:  Circuit style workout   -Seated knee ext (4# AW on right and No Weight on left) x 12 reps -Seated hip march (4#AW on right and no weight on left)  x 12 reps -Sit to stand without UE support x 10 reps - Ambulation with Rollator and 4# AW x 160 feet   -Sidelye clamshell (4# AW on right and No Weight on left) x 12 reps -Sidelye hip abd- 4# AW on right and no weight on left) x 12 reps -Sit to stand x 10 reps - Ambulation with Rollator and 4# AW x 160 feet    --STS from chair + airex 1x10 c orange ball at sternum  - Standing hip march 4# AW  12 reps - Standing hip abd 4# AW x 12 reps - Ambulation  with Rollator and 4# AW x 450 feet      PATIENT EDUCATION: Education details: exercise technique, body mechanics Person educated: Patient Education method: Medical illustrator, verbal cues Education comprehension: verbalized understanding, returned demonstration    HOME EXERCISE PROGRAM: No changes this session   PT Short Term Goals -       PT SHORT TERM GOAL #1   Title Patient will be independent in home exercise program to improve strength/mobility for better functional independence with ADLs.    Baseline 8/9: HEP to be given next session, 10/31: patient reports compliance with HEP and would like progressions added next session. 08/22/2021=Patient verbalized that he is walking and doing his LE exercises with no questions at this time.    Time 4    Period Weeks    Status Achieved    Target Date 08/16/21              PT Long Term Goals       PT LONG TERM GOAL #1   Title Patient will increase FOTO score to equal to or greater than 70%  to demonstrate statistically significant improvement in mobility and quality of life.    Baseline 8/9: 64%; risk adjusted 34%; 03/21/21 FOTO: 45; 04/24/21 FOTO: 49% ; 08/22/2021= 56% 4/20: 60% 11/28/21: 63%; 01/04/2022= 60%. 03/22/2022= 73%   Time 12    Period Weeks    Status Achieved   Target Date 06/26/2022     PT LONG TERM GOAL #2   Title Patient will increase Berg Balance score by > 6 points to demonstrate decreased fall risk during functional activities.    Baseline 4/20: 43/56 6/6: 48/56; 713/2023= 50/56   Time 12    Period Weeks    Status Achieved   Target Date 03/29/2022     PT LONG TERM GOAL #3   Title Patient will increase 10 meter walk test to >1.75m/s as to improve gait speed for better community ambulation and to reduce fall risk.    Baseline 8/9: 0.65 m/s with rollator; 10/31: 0.68 m/s with rollator. 07/06/2021= 0.71 m/s using rollator; 08/22/2021= 0.94 m/s using rollator. 4/20: 1.1 m/s with rollator    Time 12     Period Weeks    Status Achieved    Target Date 10/11/21      PT LONG TERM GOAL #4   Title Patient will increase six minute walk test distance to >1000 for progression to community ambulator and improve gait ability    Baseline 8/9: 505 ft with rollator; 03/21/21: 746ft c rollator, 04/24/21: 780ft c rollator; 07/06/2021=900 feet; 08/22/2021= 935 feet with use of Rollator 44/20: 940 ft 11/28/21: 1055 feet with rollator   Time 12  Period Weeks    Status Achieved    Target Date 01/04/22      PT LONG TERM GOAL #5   Title Patient will increase glute medius strength on Left LE from able to lift off 3" (sidelye on mat) to > 5" to improve stability, gait mechanics, and functional strength.    Baseline 10/31: 3-/5; 07/06/2021= 3-/5; 08/22/2021=3-/5 unable to achieve full ROM yet able to withstand some resistance with testing 4/20: unable to obtainfull ROM against gravity 6/6: unable to move against gravity; 01/04/2022= Continues to have difficulty - unable to raise left LE in sidelye against gravity but able to perform supine left Hip abd with some resistance= 3-/5; 03/22/2022- Patient able to raise his left LE 3 in off mat today. 04/03/2022= Patient able to raise left LE 3" off mat in sidelye today; 05/01/2022= 05/01/2022= patient able to raise left LE from sidelye position= 5"; 06/12/2022- Patient presents with 6.25 in sidelye Left hip abd against gravity. 06/26/2022= 7in in sidelye position.    Time 12    Period Weeks    Status MET   Target Date 06/26/2022     PT LONG TERM GOAL #6   Title Patient will increase Left single leg stance time to 15 seconds or greater to increase safety in shower and independence with ADLs.    Baseline 10/31; 1.83 sec without UE support; 08/22/2021=2-3  sec  on left; 6 sec on right 4/20: RLE 10 seconds LLE unable to perform 6/6: R LE 4 seconds during BERG; 01/04/2022= left = 3 sec and right = 8 sec; 02/13/2022= 20 sec on right at best and 4 sec on left LE at best. 03/23/2022=Patient able  to stand on left LE at best for 7 sec today. 04/03/2022= 27 sec on right and 12 sec on left; 06/12/2022= 13 sec on left  and 33 sec at best on right LE; 06/26/2021= 11 sec on left at best and 22 sec on right today; 07/26/2022= 10 sec on left and 20 sec on right today.; 09/04/22: 1-2 seconds on LLE. 27 sec on RLE.    Time 12    Period Weeks    Status On-going    Target Date 09/18/2022     PT LONG TERM GOAL #7   Title Patient will safely negotiate 13 steps with handrails to safely enter/exit mothers house.    Baseline 4/20: very challenging 6/6: able to copmlete safely even managing dogs per pt report; 01/04/2022- Patient performed 16 steps with B Rail-no difficulty other than fatigue.    Time 12    Period Weeks    Status Achieved   Target Date 01/04/22    PT LONG TERM GOAL #8  Title Patient will increase six minute walk test distance to >1100 ft.  for progression to community ambulator and improve gait ability   Baseline 04/03/2022= 920 feet with 4WW - (Added another goal for endurance to continue to progress community amb distances); 05/03/2022= 960 feet with upright 4WW (patient's first time using device): 06/12/2022= 990 feet with 4WW; 06/26/2022= 1010 feet with 4WW; 07/26/2022= 1015 feet; 09/04/22: 1,020'; 09/25/2022= Will reassess next visit. 10/04/2022= 1063"  Time 12   Period Weeks   Status Progressing  Target Date 12/18/2022       PT LONG TERM GOAL #9  Title Patient will be able to perform functional activities such as vacuuming or using backpack sprayer with modified independence for improved household chore abilities   Baseline 06/26/2022= Patient unable to perform either task but  has strong desire to return to performing if able. 07/26/2022- Patient has not attempted to vacuum yet and PT session focusing on LE strengthening.; 09/04/22: Able to perform vacuuming tasks mod-I. Has to break up tasks due to limited standing tolerance. Has not done outdoor tasks due to weather.  09/25/2022= Patient reports  he was successful in vacuuming a few weeks ago- tiring but able to perform; has not tried spraying or weedeating yet.  Time 12   Period Weeks   Status Ongoing  Target Date 12/18/2022   PT LONG TERM GOAL #10  Title Patient will increase glute medius strength on Left LE from able to lift off 7" (sidelye on mat) to > 10" to improve Hip stability with standing, gait mechanics, and functional strength.   Baseline 06/26/2022= 7in in sidelye position. 07/26/2022- 7.5 in; 09/04/22: 8"; 09/25/2022= not remeasured - will reassess next visit.   Time 12   Period Weeks   Status ONGOING  Target Date 12/18/2022     Plan - 12/13/21 1340     Clinical Impression Statement Treatment continued with circuit based training today, alternating  AMB and some form of LE strengthening exercise, breaks taken between episodes to allow recovery after exertion. Patient performed well overall- using some resistance with walking and able to swing left LE out well and complete reps well.  Pt will continue to benefit from skilled PT services to address remaining deficits to maximize his functional independence with mobility and decrease fall risk.    Personal Factors and Comorbidities Age;Comorbidity 3+;Fitness;Past/Current Experience;Time since onset of injury/illness/exacerbation    Comorbidities aroxysmal a fib, MS, lymphedema, hypothyroidism, Graves Disease, arthritis, lumbar herniated disc (L5), depression and neuropathy    Examination-Activity Limitations Bed Mobility;Bend;Caring for Others;Carry;Reach Overhead;Locomotion Level;Lift;Hygiene/Grooming;Dressing;Squat;Stairs;Stand;Toileting;Transfers    Examination-Participation Restrictions Cleaning;Community Activity;Laundry;Occupation;Shop;Volunteer;Yard Work    Conservation officer, historic buildings Evolving/Moderate complexity    Rehab Potential Fair    PT Frequency 1-2x / week    PT Duration 12 weeks    PT Treatment/Interventions ADLs/Self Care Home Management;Aquatic  Therapy;Canalith Repostioning;Biofeedback;Cryotherapy;Ultrasound;Traction;Moist Heat;Electrical Stimulation;DME Instruction;Gait training;Therapeutic exercise;Therapeutic activities;Functional mobility training;Stair training;Balance training;Neuromuscular re-education;Patient/family education;Manual techniques;Orthotic Fit/Training;Compression bandaging;Passive range of motion;Vestibular;Taping;Splinting;Energy conservation;Dry needling;Visual/perceptual remediation/compensation    PT Next Visit Plan Continue with progessive balance training, Progressive LE strenthening as appropriate.    PT Home Exercise Plan Access Code: [email or web address] URL: [email or web address]  Added Bridges with BTB and encouraged more walking around the home during commerical breaks when watching TV.    Consulted and Agree with Plan of Care Patient            5:09 PM, 10/16/22 Louis Meckel, PT Physical Therapist - [facility]  Out[facility] [phone]

## 2022-10-18 ENCOUNTER — Ambulatory Visit: Payer: Medicare PPO

## 2022-10-18 DIAGNOSIS — R2689 Other abnormalities of gait and mobility: Secondary | ICD-10-CM

## 2022-10-18 DIAGNOSIS — M6281 Muscle weakness (generalized): Secondary | ICD-10-CM | POA: Diagnosis not present

## 2022-10-18 DIAGNOSIS — R278 Other lack of coordination: Secondary | ICD-10-CM | POA: Diagnosis not present

## 2022-10-18 DIAGNOSIS — R2681 Unsteadiness on feet: Secondary | ICD-10-CM | POA: Diagnosis not present

## 2022-10-18 DIAGNOSIS — R269 Unspecified abnormalities of gait and mobility: Secondary | ICD-10-CM | POA: Diagnosis not present

## 2022-10-18 DIAGNOSIS — R262 Difficulty in walking, not elsewhere classified: Secondary | ICD-10-CM | POA: Diagnosis not present

## 2022-10-18 NOTE — Therapy (Signed)
OUTPATIENT PHYSICAL THERAPY TREATMENT NOTE   Patient Name: Dustin Gray MRN: [id number] DOB:02/23/1956, 67 y.o., male Today's Date: 10/19/2022  PCP: Blair Heys, MD REFERRING PROVIDER: Shirlean Schlein, MD   PT End of Session - 10/18/22 1437     Visit Number 129    Number of Visits 148    Date for PT Re-Evaluation 12/18/22    Authorization Type [employer] Auth 11/28/21-02/22/22 for 24 visits    Progress Note Due on Visit 130    PT Start Time 1432    PT Stop Time 1514    PT Time Calculation (min) 42 min    Equipment Utilized During Treatment Gait belt    Activity Tolerance Patient tolerated treatment well;No increased pain                  Past Medical History:  Diagnosis Date   Abscess    groin   Arthritis    lower spine   Erectile dysfunction    Low testosterone    Lumbar herniated disc    L5   Multiple sclerosis (HCC)    Staph aureus infection    Past Surgical History:  Procedure Laterality Date   CARDIOVERSION N/A 11/22/2021   Procedure: CARDIOVERSION;  Surgeon: Debbe Odea, MD;  Location: [facility];  Service: Cardiovascular;  Laterality: N/A;   COLONOSCOPY WITH PROPOFOL N/A 07/04/2016   Procedure: COLONOSCOPY WITH PROPOFOL;  Surgeon: Midge Minium, MD;  Location: [facility];  Service: Endoscopy;  Laterality: N/A;   FINGER SURGERY  1964   MASS EXCISION  1960   POLYPECTOMY  07/04/2016   Procedure: POLYPECTOMY;  Surgeon: Midge Minium, MD;  Location: [facility];  Service: Endoscopy;;   TONSILLECTOMY     Patient Active Problem List   Diagnosis Date Noted   Persistent atrial fibrillation (HCC)    Graves disease    Abdominal bloating    Atrial fibrillation with RVR (HCC) 10/01/2019   Hyperthyroidism    Atrial fibrillation with rapid ventricular response (HCC) 09/30/2019   Leg edema    Left leg cellulitis    Special screening for malignant neoplasms, colon    Polyp of sigmoid colon    Benign neoplasm of ascending colon     Rectal polyp    Herniated nucleus pulposus, L5-S1 11/09/2015   Low back pain 10/25/2015   Herniated nucleus pulposus, C5-6 right 10/06/2015   Dysesthesia 09/16/2015   Spasticity 09/16/2015   Unsteady gait 09/16/2015   Multiple sclerosis (HCC) 06/08/2011     REFERRING DIAG: R26.81 (ICD-10-CM) - Unsteadiness on feet   THERAPY DIAG:  Unsteadiness on feet  Difficulty in walking, not elsewhere classified  Abnormality of gait and mobility  Muscle weakness (generalized)  Rationale for Evaluation and Treatment Rehabilitation  PERTINENT HISTORY: Patient presents to physical therapy for unsteadiness, walking instability, fatigue. His PMH includes paroxysmal a fib, MS, lymphedema, hypothyroidism, Graves Disease, arthritis, lumbar herniated disc (L5), depression and neuropathy. Patient first developed symptoms of MS in 2005 and was diagnosed in 2006. Patient has done PT in the past, but hasn't been seen since 2020 at [facility] Has not been doing his exercises in the past year. Walk outside to garage to ride lawnmower, walks in house. Retired Optician, dispensing. Has a rollator at home but doesn't use it in the house   PRECAUTIONS: Fall  SUBJECTIVE: Patient reports still having groin pain PAIN:  Are you having pain? None at present     TODAY'S TREATMENT:  THEREX:   walking forward  15 feet then retro gait x 15 feet x 6 times - side step x 15 feet then retro gait x 15 feet x 6 times -calf raises on 1/2 foam 2 sets of 12 reps - Toe raises on 1/2 foam- 2 sets of 12 reps -Ambulation with Bariatric 4WW - 320 feet  Dynamic step tap (attempted with min UE support when raising right LE and no UE support when raising left LE) x 20 reps each Sit to stand with orange ball x 15 reps -Ambulation with bariatric 4WW - 320 feet         PATIENT EDUCATION: Education details: exercise technique, body mechanics Person educated: Patient Education method: Medical illustrator,  verbal cues Education comprehension: verbalized understanding, returned demonstration    HOME EXERCISE PROGRAM: No changes this session   PT Short Term Goals -       PT SHORT TERM GOAL #1   Title Patient will be independent in home exercise program to improve strength/mobility for better functional independence with ADLs.    Baseline 8/9: HEP to be given next session, 10/31: patient reports compliance with HEP and would like progressions added next session. 08/22/2021=Patient verbalized that he is walking and doing his LE exercises with no questions at this time.    Time 4    Period Weeks    Status Achieved    Target Date 08/16/21              PT Long Term Goals       PT LONG TERM GOAL #1   Title Patient will increase FOTO score to equal to or greater than 70%  to demonstrate statistically significant improvement in mobility and quality of life.    Baseline 8/9: 64%; risk adjusted 34%; 03/21/21 FOTO: 45; 04/24/21 FOTO: 49% ; 08/22/2021= 56% 4/20: 60% 11/28/21: 63%; 01/04/2022= 60%. 03/22/2022= 73%   Time 12    Period Weeks    Status Achieved   Target Date 06/26/2022     PT LONG TERM GOAL #2   Title Patient will increase Berg Balance score by > 6 points to demonstrate decreased fall risk during functional activities.    Baseline 4/20: 43/56 6/6: 48/56; 713/2023= 50/56   Time 12    Period Weeks    Status Achieved   Target Date 03/29/2022     PT LONG TERM GOAL #3   Title Patient will increase 10 meter walk test to >1.62m/s as to improve gait speed for better community ambulation and to reduce fall risk.    Baseline 8/9: 0.65 m/s with rollator; 10/31: 0.68 m/s with rollator. 07/06/2021= 0.71 m/s using rollator; 08/22/2021= 0.94 m/s using rollator. 4/20: 1.1 m/s with rollator    Time 12    Period Weeks    Status Achieved    Target Date 10/11/21      PT LONG TERM GOAL #4   Title Patient will increase six minute walk test distance to >1000 for progression to community ambulator and  improve gait ability    Baseline 8/9: 505 ft with rollator; 03/21/21: 727ft c rollator, 04/24/21: 742ft c rollator; 07/06/2021=900 feet; 08/22/2021= 935 feet with use of Rollator 44/20: 940 ft 11/28/21: 1055 feet with rollator   Time 12    Period Weeks    Status Achieved    Target Date 01/04/22      PT LONG TERM GOAL #5   Title Patient will increase glute medius strength on Left LE from able to lift off 3" (sidelye on mat)  to > 5" to improve stability, gait mechanics, and functional strength.    Baseline 10/31: 3-/5; 07/06/2021= 3-/5; 08/22/2021=3-/5 unable to achieve full ROM yet able to withstand some resistance with testing 4/20: unable to obtainfull ROM against gravity 6/6: unable to move against gravity; 01/04/2022= Continues to have difficulty - unable to raise left LE in sidelye against gravity but able to perform supine left Hip abd with some resistance= 3-/5; 03/22/2022- Patient able to raise his left LE 3 in off mat today. 04/03/2022= Patient able to raise left LE 3" off mat in sidelye today; 05/01/2022= 05/01/2022= patient able to raise left LE from sidelye position= 5"; 06/12/2022- Patient presents with 6.25 in sidelye Left hip abd against gravity. 06/26/2022= 7in in sidelye position.    Time 12    Period Weeks    Status MET   Target Date 06/26/2022     PT LONG TERM GOAL #6   Title Patient will increase Left single leg stance time to 15 seconds or greater to increase safety in shower and independence with ADLs.    Baseline 10/31; 1.83 sec without UE support; 08/22/2021=2-3  sec  on left; 6 sec on right 4/20: RLE 10 seconds LLE unable to perform 6/6: R LE 4 seconds during BERG; 01/04/2022= left = 3 sec and right = 8 sec; 02/13/2022= 20 sec on right at best and 4 sec on left LE at best. 03/23/2022=Patient able to stand on left LE at best for 7 sec today. 04/03/2022= 27 sec on right and 12 sec on left; 06/12/2022= 13 sec on left  and 33 sec at best on right LE; 06/26/2021= 11 sec on left at best and 22 sec on  right today; 07/26/2022= 10 sec on left and 20 sec on right today.; 09/04/22: 1-2 seconds on LLE. 27 sec on RLE.    Time 12    Period Weeks    Status On-going    Target Date 09/18/2022     PT LONG TERM GOAL #7   Title Patient will safely negotiate 13 steps with handrails to safely enter/exit mothers house.    Baseline 4/20: very challenging 6/6: able to copmlete safely even managing dogs per pt report; 01/04/2022- Patient performed 16 steps with B Rail-no difficulty other than fatigue.    Time 12    Period Weeks    Status Achieved   Target Date 01/04/22    PT LONG TERM GOAL #8  Title Patient will increase six minute walk test distance to >1100 ft.  for progression to community ambulator and improve gait ability   Baseline 04/03/2022= 920 feet with 4WW - (Added another goal for endurance to continue to progress community amb distances); 05/03/2022= 960 feet with upright 4WW (patient's first time using device): 06/12/2022= 990 feet with 4WW; 06/26/2022= 1010 feet with 4WW; 07/26/2022= 1015 feet; 09/04/22: 1,020'; 09/25/2022= Will reassess next visit. 10/04/2022= 1063"  Time 12   Period Weeks   Status Progressing  Target Date 12/18/2022       PT LONG TERM GOAL #9  Title Patient will be able to perform functional activities such as vacuuming or using backpack sprayer with modified independence for improved household chore abilities   Baseline 06/26/2022= Patient unable to perform either task but has strong desire to return to performing if able. 07/26/2022- Patient has not attempted to vacuum yet and PT session focusing on LE strengthening.; 09/04/22: Able to perform vacuuming tasks mod-I. Has to break up tasks due to limited standing tolerance. Has not done  outdoor tasks due to weather.  09/25/2022= Patient reports he was successful in vacuuming a few weeks ago- tiring but able to perform; has not tried spraying or weedeating yet.  Time 12   Period Weeks   Status Ongoing  Target Date 12/18/2022   PT LONG  TERM GOAL #10  Title Patient will increase glute medius strength on Left LE from able to lift off 7" (sidelye on mat) to > 10" to improve Hip stability with standing, gait mechanics, and functional strength.   Baseline 06/26/2022= 7in in sidelye position. 07/26/2022- 7.5 in; 09/04/22: 8"; 09/25/2022= not remeasured - will reassess next visit.   Time 12   Period Weeks   Status ONGOING  Target Date 12/18/2022     Plan - 12/13/21 1340     Clinical Impression Statement Patient performed well without significant hyperextension of left knee with standing activities. He continues to fatigue with Left LE- yet able to stand well- walking good distance without dragging left LE.  Pt will continue to benefit from skilled PT services to address remaining deficits to maximize his functional independence with mobility and decrease fall risk.    Personal Factors and Comorbidities Age;Comorbidity 3+;Fitness;Past/Current Experience;Time since onset of injury/illness/exacerbation    Comorbidities aroxysmal a fib, MS, lymphedema, hypothyroidism, Graves Disease, arthritis, lumbar herniated disc (L5), depression and neuropathy    Examination-Activity Limitations Bed Mobility;Bend;Caring for Others;Carry;Reach Overhead;Locomotion Level;Lift;Hygiene/Grooming;Dressing;Squat;Stairs;Stand;Toileting;Transfers    Examination-Participation Restrictions Cleaning;Community Activity;Laundry;Occupation;Shop;Volunteer;Yard Work    Conservation officer, historic buildings Evolving/Moderate complexity    Rehab Potential Fair    PT Frequency 1-2x / week    PT Duration 12 weeks    PT Treatment/Interventions ADLs/Self Care Home Management;Aquatic Therapy;Canalith Repostioning;Biofeedback;Cryotherapy;Ultrasound;Traction;Moist Heat;Electrical Stimulation;DME Instruction;Gait training;Therapeutic exercise;Therapeutic activities;Functional mobility training;Stair training;Balance training;Neuromuscular re-education;Patient/family education;Manual  techniques;Orthotic Fit/Training;Compression bandaging;Passive range of motion;Vestibular;Taping;Splinting;Energy conservation;Dry needling;Visual/perceptual remediation/compensation    PT Next Visit Plan Continue with progessive balance training, Progressive LE strenthening as appropriate.    PT Home Exercise Plan Access Code: [email or web address] URL: [email or web address]  Added Bridges with BTB and encouraged more walking around the home during commerical breaks when watching TV.    Consulted and Agree with Plan of Care Patient            11:08 AM, 10/19/22 Louis Meckel, PT Physical Therapist - [facility]  Outpatient Physical Therapy- Main Campus [phone]

## 2022-10-23 ENCOUNTER — Ambulatory Visit: Payer: Medicare PPO

## 2022-10-23 DIAGNOSIS — R2689 Other abnormalities of gait and mobility: Secondary | ICD-10-CM

## 2022-10-23 DIAGNOSIS — R2681 Unsteadiness on feet: Secondary | ICD-10-CM

## 2022-10-23 DIAGNOSIS — R262 Difficulty in walking, not elsewhere classified: Secondary | ICD-10-CM | POA: Diagnosis not present

## 2022-10-23 DIAGNOSIS — R269 Unspecified abnormalities of gait and mobility: Secondary | ICD-10-CM | POA: Diagnosis not present

## 2022-10-23 DIAGNOSIS — M6281 Muscle weakness (generalized): Secondary | ICD-10-CM

## 2022-10-23 DIAGNOSIS — R278 Other lack of coordination: Secondary | ICD-10-CM

## 2022-10-23 NOTE — Therapy (Signed)
OUTPATIENT PHYSICAL THERAPY TREATMENT Physical Therapy Progress Note   Dates of reporting period  09/04/22   to   10/23/22    Patient Name: Dustin Gray MRN: [id number] DOB:07-06-1955, 67 y.o., male Today's Date: 10/23/2022  PCP: Blair Heys, MD REFERRING PROVIDER: Shirlean Schlein, MD   PT End of Session - 10/23/22 1527     Visit Number 130    Number of Visits 148    Date for PT Re-Evaluation 12/18/22    Authorization Type [employer] Auth 3/26-5/30/24- 15 visits    Authorization Time Period 4/2-6/25/24    Progress Note Due on Visit 130    PT Start Time 1435    PT Stop Time 1515    PT Time Calculation (min) 40 min    Equipment Utilized During Treatment Gait belt    Activity Tolerance Patient tolerated treatment well;No increased pain    Behavior During Therapy WFL for tasks assessed/performed              Past Medical History:  Diagnosis Date   Abscess    groin   Arthritis    lower spine   Erectile dysfunction    Low testosterone    Lumbar herniated disc    L5   Multiple sclerosis (HCC)    Staph aureus infection    Past Surgical History:  Procedure Laterality Date   CARDIOVERSION N/A 11/22/2021   Procedure: CARDIOVERSION;  Surgeon: Debbe Odea, MD;  Location: [facility];  Service: Cardiovascular;  Laterality: N/A;   COLONOSCOPY WITH PROPOFOL N/A 07/04/2016   Procedure: COLONOSCOPY WITH PROPOFOL;  Surgeon: Midge Minium, MD;  Location: [facility];  Service: Endoscopy;  Laterality: N/A;   FINGER SURGERY  1964   MASS EXCISION  1960   POLYPECTOMY  07/04/2016   Procedure: POLYPECTOMY;  Surgeon: Midge Minium, MD;  Location: [facility];  Service: Endoscopy;;   TONSILLECTOMY     Patient Active Problem List   Diagnosis Date Noted   Persistent atrial fibrillation (HCC)    Graves disease    Abdominal bloating    Atrial fibrillation with RVR (HCC) 10/01/2019   Hyperthyroidism    Atrial fibrillation with rapid ventricular response  (HCC) 09/30/2019   Leg edema    Left leg cellulitis    Special screening for malignant neoplasms, colon    Polyp of sigmoid colon    Benign neoplasm of ascending colon    Rectal polyp    Herniated nucleus pulposus, L5-S1 11/09/2015   Low back pain 10/25/2015   Herniated nucleus pulposus, C5-6 right 10/06/2015   Dysesthesia 09/16/2015   Spasticity 09/16/2015   Unsteady gait 09/16/2015   Multiple sclerosis (HCC) 06/08/2011     REFERRING DIAG: R26.81 (ICD-10-CM) - Unsteadiness on feet   THERAPY DIAG:  Unsteadiness on feet  Difficulty in walking, not elsewhere classified  Abnormality of gait and mobility  Muscle weakness (generalized)  Rationale for Evaluation and Treatment Rehabilitation  PERTINENT HISTORY: Patient presents to physical therapy for unsteadiness, walking instability, fatigue. His PMH includes paroxysmal a fib, MS, lymphedema, hypothyroidism, Graves Disease, arthritis, lumbar herniated disc (L5), depression and neuropathy. Patient first developed symptoms of MS in 2005 and was diagnosed in 2006. Patient has done PT in the past, but hasn't been seen since 2020 at [facility]. Has not been doing his exercises in the past year. Walk outside to garage to ride lawnmower, walks in house. Retired Optician, dispensing. Has a rollator at home but doesn't use it in the house   PRECAUTIONS:  Fall  SUBJECTIVE: Pt doing well today, no significant updates since prior session, no falls. Pt still having to provide transportation for his wife which is making his schedule busier than usual.   PAIN:  Are you having pain? None at present     TODAY'S TREATMENT: -Discussion regarding progress toward general goals of treament and function -FOTO survey  - performance x2  -Simulated spraying in clinic with intermittent gait using rollator, 15lbs fastened to back, and PVC simulated spray wand x8 minutes  -simulated vacuuming with PVC and 5lb weight on towel: x5 minutes, stepping to  accommodate floor area, no device used.  *defer simulated edging to later session due to time constraints.   PATIENT EDUCATION: Education details: exercise technique, body mechanics Person educated: Patient Education method: Medical illustrator, verbal cues Education comprehension: verbalized understanding, returned demonstration    HOME EXERCISE PROGRAM: No changes this session   PT Short Term Goals -       PT SHORT TERM GOAL #1   Title Patient will be independent in home exercise program to improve strength/mobility for better functional independence with ADLs.    Baseline 8/9: HEP to be given next session, 10/31: patient reports compliance with HEP and would like progressions added next session. 08/22/2021=Patient verbalized that he is walking and doing his LE exercises with no questions at this time.    Time 4    Period Weeks    Status Achieved    Target Date 08/16/21              PT Long Term Goals       PT LONG TERM GOAL #1   Title Patient will increase FOTO score to equal to or greater than 70%  to demonstrate statistically significant improvement in mobility and quality of life.    Baseline 8/9: 64%; risk adjusted 34%; 03/21/21 FOTO: 45; 04/24/21 FOTO: 49% ; 08/22/2021= 56% 4/20: 60% 11/28/21: 63%; 01/04/2022= 60%. 03/22/2022= 73%; 10/23/22: 68   Time 12    Period Weeks    Status Achieved   Target Date 06/26/2022     PT LONG TERM GOAL #2   Title Patient will increase Berg Balance score by > 6 points to demonstrate decreased fall risk during functional activities.    Baseline 4/20: 43/56 6/6: 48/56; 713/2023= 50/56   Time 12    Period Weeks    Status Achieved   Target Date 03/29/2022     PT LONG TERM GOAL #3   Title Patient will increase 10 meter walk test to >1.45m/s as to improve gait speed for better community ambulation and to reduce fall risk.    Baseline 8/9: 0.65 m/s with rollator; 10/31: 0.68 m/s with rollator. 07/06/2021= 0.71 m/s using rollator;  08/22/2021= 0.94 m/s using rollator. 10/12/21: 1.1 m/s with rollator; 10/23/22: 0.86m/s    Time 12    Period Weeks    Status Achieved; maintaining gains    Target Date 10/11/21      PT LONG TERM GOAL #4   Title Patient will increase six minute walk test distance to >1000 for progression to community ambulator and improve gait ability    Baseline 8/9: 505 ft with rollator; 03/21/21: 776ft c rollator, 04/24/21: 748ft c rollator; 07/06/2021=900 feet; 08/22/2021= 935 feet with use of Rollator 44/20: 940 ft 11/28/21: 1055 feet with rollator   Time 12    Period Weeks    Status Achieved    Target Date 01/04/22      PT LONG TERM GOAL #  5   Title Patient will increase glute medius strength on Left LE from able to lift off 3" (sidelye on mat) to > 5" to improve stability, gait mechanics, and functional strength.    Baseline 10/31: 3-/5; 07/06/2021= 3-/5; 08/22/2021=3-/5 unable to achieve full ROM yet able to withstand some resistance with testing 4/20: unable to obtainfull ROM against gravity 6/6: unable to move against gravity; 01/04/2022= Continues to have difficulty - unable to raise left LE in sidelye against gravity but able to perform supine left Hip abd with some resistance= 3-/5; 03/22/2022- Patient able to raise his left LE 3 in off mat today. 04/03/2022= Patient able to raise left LE 3" off mat in sidelye today; 05/01/2022= 05/01/2022= patient able to raise left LE from sidelye position= 5"; 06/12/2022- Patient presents with 6.25 in sidelye Left hip abd against gravity. 06/26/2022= 7in in sidelye position.    Time 12    Period Weeks    Status MET   Target Date 06/26/2022     PT LONG TERM GOAL #6   Title Patient will increase Left single leg stance time to 15 seconds or greater to increase safety in shower and independence with ADLs.    Baseline 10/31; 1.83 sec without UE support; 08/22/2021=2-3  sec  on left; 6 sec on right 4/20: RLE 10 seconds LLE unable to perform 6/6: R LE 4 seconds during BERG; 01/04/2022=  left = 3 sec and right = 8 sec; 02/13/2022= 20 sec on right at best and 4 sec on left LE at best. 03/23/2022=Patient able to stand on left LE at best for 7 sec today. 04/03/2022= 27 sec on right and 12 sec on left; 06/12/2022= 13 sec on left  and 33 sec at best on right LE; 06/26/2021= 11 sec on left at best and 22 sec on right today; 07/26/2022= 10 sec on left and 20 sec on right today.; 09/04/22: 1-2 seconds on LLE. 27 sec on RLE.    Time 12    Period Weeks    Status On-going    Target Date 09/18/2022     PT LONG TERM GOAL #7   Title Patient will safely negotiate 13 steps with handrails to safely enter/exit mothers house.    Baseline 4/20: very challenging 6/6: able to copmlete safely even managing dogs per pt report; 01/04/2022- Patient performed 16 steps with B Rail-no difficulty other than fatigue.    Time 12    Period Weeks    Status Achieved   Target Date 01/04/22    PT LONG TERM GOAL #8  Title Patient will increase six minute walk test distance to >1100 ft.  for progression to community ambulator and improve gait ability   Baseline 04/03/2022= 920 feet with 4WW - (Added another goal for endurance to continue to progress community amb distances); 05/03/2022= 960 feet with upright 4WW (patient's first time using device): 06/12/2022= 990 feet with 4WW; 06/26/2022= 1010 feet with 4WW; 07/26/2022= 1015 feet; 09/04/22: 1,020'; 09/25/2022= Will reassess next visit. 10/04/2022= 1017ft;  Time 12   Period Weeks   Status Progressing  Target Date 12/18/2022       PT LONG TERM GOAL #9  Title Patient will be able to perform functional activities such as vacuuming or using backpack sprayer with modified independence for improved household chore abilities   Baseline 06/26/2022= Patient unable to perform either task but has strong desire to return to performing if able. 07/26/2022- Patient has not attempted to vacuum yet and PT session focusing on  LE strengthening.; 09/04/22: Able to perform vacuuming tasks mod-I. Has to  break up tasks due to limited standing tolerance. Has not done outdoor tasks due to weather.  09/25/2022= Patient reports he was successful in vacuuming a few weeks ago- tiring but able to perform; has not tried spraying or weedeating yet; 4/30: commenced activity simulation in session today. Still unable to perform spraying or edging at home as desired.   Time 12   Period Weeks   Status Ongoing  Target Date 12/18/2022   PT LONG TERM GOAL #10  Title Patient will increase glute medius strength on Left LE from able to lift off 7" (sidelye on mat) to > 10" to improve Hip stability with standing, gait mechanics, and functional strength.   Baseline 06/26/2022= 7in in sidelye position. 07/26/2022- 7.5 in; 09/04/22: 8"; 09/25/2022: deferred; 4/30: deferred   Time 12   Period Weeks   Status ONGOING  Target Date 12/18/2022     Plan - 12/13/21 1340     Clinical Impression Statement Reassessment this date- pt showing consistent holding of recent improvements and/or additional improvement beyond prior testing. Many of pt's newest goals of functional based goals, specific to activity, hence we dive into activity simulation after other objective tests and measures. Pt is grateful for opportunity to attempt these risky and difficult tasks in a safe environment to improve ability to resume these IADL at home. Pt will continue to benefit from skilled PT services to address remaining deficits to maximize his functional independence with mobility and decrease fall risk.    Personal Factors and Comorbidities Age;Comorbidity 3+;Fitness;Past/Current Experience;Time since onset of injury/illness/exacerbation    Comorbidities aroxysmal a fib, MS, lymphedema, hypothyroidism, Graves Disease, arthritis, lumbar herniated disc (L5), depression and neuropathy    Examination-Activity Limitations Bed Mobility;Bend;Caring for Others;Carry;Reach Overhead;Locomotion Level;Lift;Hygiene/Grooming;Dressing;Squat;Stairs;Stand;Toileting;Transfers     Examination-Participation Restrictions Cleaning;Community Activity;Laundry;Occupation;Shop;Volunteer;Yard Work    Conservation officer, historic buildings Evolving/Moderate complexity    Rehab Potential Fair    PT Frequency 1-2x / week    PT Duration 12 weeks    PT Treatment/Interventions ADLs/Self Care Home Management;Aquatic Therapy;Canalith Repostioning;Biofeedback;Cryotherapy;Ultrasound;Traction;Moist Heat;Electrical Stimulation;DME Instruction;Gait training;Therapeutic exercise;Therapeutic activities;Functional mobility training;Stair training;Balance training;Neuromuscular re-education;Patient/family education;Manual techniques;Orthotic Fit/Training;Compression bandaging;Passive range of motion;Vestibular;Taping;Splinting;Energy conservation;Dry needling;Visual/perceptual remediation/compensation    PT Next Visit Plan Continue with progessive balance training, Progressive LE strenthening as appropriate.    PT Home Exercise Plan Access Code: [email or web address] URL: [email or web address]  Added Bridges with BTB and encouraged more walking around the home during commerical breaks when watching TV.    Consulted and Agree with Plan of Care Patient            3:31 PM, 10/23/22  3:31 PM, 10/23/22 Rosamaria Lints, PT, DPT Physical Therapist - [facility]  Outpatient Physical Therapy- Main Campus [phone]

## 2022-10-25 ENCOUNTER — Ambulatory Visit: Payer: Medicare PPO | Attending: Neurology

## 2022-10-25 DIAGNOSIS — R2689 Other abnormalities of gait and mobility: Secondary | ICD-10-CM | POA: Diagnosis not present

## 2022-10-25 DIAGNOSIS — M6281 Muscle weakness (generalized): Secondary | ICD-10-CM

## 2022-10-25 DIAGNOSIS — R278 Other lack of coordination: Secondary | ICD-10-CM

## 2022-10-25 DIAGNOSIS — R2681 Unsteadiness on feet: Secondary | ICD-10-CM | POA: Diagnosis not present

## 2022-10-25 DIAGNOSIS — R262 Difficulty in walking, not elsewhere classified: Secondary | ICD-10-CM | POA: Diagnosis not present

## 2022-10-25 DIAGNOSIS — R269 Unspecified abnormalities of gait and mobility: Secondary | ICD-10-CM | POA: Diagnosis not present

## 2022-10-25 NOTE — Therapy (Signed)
OUTPATIENT PHYSICAL THERAPY TREATMENT     Patient Name: Dustin Gray MRN: [id number] DOB:Oct 04, 1955, 67 y.o., male Today's Date: 10/25/2022  PCP: Blair Heys, MD REFERRING PROVIDER: Shirlean Schlein, MD   PT End of Session - 10/25/22 1437     Visit Number 131    Number of Visits 148    Date for PT Re-Evaluation 12/18/22    Authorization Type [employer] Auth 3/26-5/30/24- 15 visits    Authorization Time Period 4/2-6/25/24    Progress Note Due on Visit 140    PT Start Time 1430    PT Stop Time 1504    PT Time Calculation (min) 34 min    Equipment Utilized During Treatment Gait belt    Activity Tolerance Patient tolerated treatment well;No increased pain    Behavior During Therapy WFL for tasks assessed/performed              Past Medical History:  Diagnosis Date   Abscess    groin   Arthritis    lower spine   Erectile dysfunction    Low testosterone    Lumbar herniated disc    L5   Multiple sclerosis (HCC)    Staph aureus infection    Past Surgical History:  Procedure Laterality Date   CARDIOVERSION N/A 11/22/2021   Procedure: CARDIOVERSION;  Surgeon: Debbe Odea, MD;  Location: [facility];  Service: Cardiovascular;  Laterality: N/A;   COLONOSCOPY WITH PROPOFOL N/A 07/04/2016   Procedure: COLONOSCOPY WITH PROPOFOL;  Surgeon: Midge Minium, MD;  Location: [facility];  Service: Endoscopy;  Laterality: N/A;   FINGER SURGERY  1964   MASS EXCISION  1960   POLYPECTOMY  07/04/2016   Procedure: POLYPECTOMY;  Surgeon: Midge Minium, MD;  Location: [facility];  Service: Endoscopy;;   TONSILLECTOMY     Patient Active Problem List   Diagnosis Date Noted   Persistent atrial fibrillation (HCC)    Graves disease    Abdominal bloating    Atrial fibrillation with RVR (HCC) 10/01/2019   Hyperthyroidism    Atrial fibrillation with rapid ventricular response (HCC) 09/30/2019   Leg edema    Left leg cellulitis    Special screening for  malignant neoplasms, colon    Polyp of sigmoid colon    Benign neoplasm of ascending colon    Rectal polyp    Herniated nucleus pulposus, L5-S1 11/09/2015   Low back pain 10/25/2015   Herniated nucleus pulposus, C5-6 right 10/06/2015   Dysesthesia 09/16/2015   Spasticity 09/16/2015   Unsteady gait 09/16/2015   Multiple sclerosis (HCC) 06/08/2011     REFERRING DIAG: R26.81 (ICD-10-CM) - Unsteadiness on feet   THERAPY DIAG:  Unsteadiness on feet  Difficulty in walking, not elsewhere classified  Abnormality of gait and mobility  Muscle weakness (generalized)  Rationale for Evaluation and Treatment Rehabilitation  PERTINENT HISTORY: Patient presents to physical therapy for unsteadiness, walking instability, fatigue. His PMH includes paroxysmal a fib, MS, lymphedema, hypothyroidism, Graves Disease, arthritis, lumbar herniated disc (L5), depression and neuropathy. Patient first developed symptoms of MS in 2005 and was diagnosed in 2006. Patient has done PT in the past, but hasn't been seen since 2020 at [facility]. Has not been doing his exercises in the past year. Walk outside to garage to ride lawnmower, walks in house. Retired Optician, dispensing. Has a rollator at home but doesn't use it in the house   PRECAUTIONS: Fall  SUBJECTIVE: Patient reports no soreness after last visit and doing well this week.  PAIN:  Are you having pain? None at present     TODAY'S TREATMENT:   -Simulated spraying in clinic with intermittent gait using rollator, 15lbs fastened to back, and PVC simulated spray wand x12 minutes  -Sit to stand holding onto 5kg orange ball 2 sets of 10 reps -Simulated edging/weed eating using clinic vacuum without an AD as patient would require 2 hand to operate device x 8 reps  PATIENT EDUCATION: Education details: exercise technique, body mechanics Person educated: Patient Education method: Medical illustrator, verbal cues Education comprehension:  verbalized understanding, returned demonstration    HOME EXERCISE PROGRAM: No changes this session   PT Short Term Goals -       PT SHORT TERM GOAL #1   Title Patient will be independent in home exercise program to improve strength/mobility for better functional independence with ADLs.    Baseline 8/9: HEP to be given next session, 10/31: patient reports compliance with HEP and would like progressions added next session. 08/22/2021=Patient verbalized that he is walking and doing his LE exercises with no questions at this time.    Time 4    Period Weeks    Status Achieved    Target Date 08/16/21              PT Long Term Goals       PT LONG TERM GOAL #1   Title Patient will increase FOTO score to equal to or greater than 70%  to demonstrate statistically significant improvement in mobility and quality of life.    Baseline 8/9: 64%; risk adjusted 34%; 03/21/21 FOTO: 45; 04/24/21 FOTO: 49% ; 08/22/2021= 56% 4/20: 60% 11/28/21: 63%; 01/04/2022= 60%. 03/22/2022= 73%; 10/23/22: 68   Time 12    Period Weeks    Status Achieved   Target Date 06/26/2022     PT LONG TERM GOAL #2   Title Patient will increase Berg Balance score by > 6 points to demonstrate decreased fall risk during functional activities.    Baseline 4/20: 43/56 6/6: 48/56; 713/2023= 50/56   Time 12    Period Weeks    Status Achieved   Target Date 03/29/2022     PT LONG TERM GOAL #3   Title Patient will increase 10 meter walk test to >1.9m/s as to improve gait speed for better community ambulation and to reduce fall risk.    Baseline 8/9: 0.65 m/s with rollator; 10/31: 0.68 m/s with rollator. 07/06/2021= 0.71 m/s using rollator; 08/22/2021= 0.94 m/s using rollator. 10/12/21: 1.1 m/s with rollator; 10/23/22: 0.25m/s    Time 12    Period Weeks    Status Achieved; maintaining gains    Target Date 10/11/21      PT LONG TERM GOAL #4   Title Patient will increase six minute walk test distance to >1000 for progression to community  ambulator and improve gait ability    Baseline 8/9: 505 ft with rollator; 03/21/21: 769ft c rollator, 04/24/21: 772ft c rollator; 07/06/2021=900 feet; 08/22/2021= 935 feet with use of Rollator 44/20: 940 ft 11/28/21: 1055 feet with rollator   Time 12    Period Weeks    Status Achieved    Target Date 01/04/22      PT LONG TERM GOAL #5   Title Patient will increase glute medius strength on Left LE from able to lift off 3" (sidelye on mat) to > 5" to improve stability, gait mechanics, and functional strength.    Baseline 10/31: 3-/5; 07/06/2021= 3-/5; 08/22/2021=3-/5 unable to achieve full  ROM yet able to withstand some resistance with testing 4/20: unable to obtainfull ROM against gravity 6/6: unable to move against gravity; 01/04/2022= Continues to have difficulty - unable to raise left LE in sidelye against gravity but able to perform supine left Hip abd with some resistance= 3-/5; 03/22/2022- Patient able to raise his left LE 3 in off mat today. 04/03/2022= Patient able to raise left LE 3" off mat in sidelye today; 05/01/2022= 05/01/2022= patient able to raise left LE from sidelye position= 5"; 06/12/2022- Patient presents with 6.25 in sidelye Left hip abd against gravity. 06/26/2022= 7in in sidelye position.    Time 12    Period Weeks    Status MET   Target Date 06/26/2022     PT LONG TERM GOAL #6   Title Patient will increase Left single leg stance time to 15 seconds or greater to increase safety in shower and independence with ADLs.    Baseline 10/31; 1.83 sec without UE support; 08/22/2021=2-3  sec  on left; 6 sec on right 4/20: RLE 10 seconds LLE unable to perform 6/6: R LE 4 seconds during BERG; 01/04/2022= left = 3 sec and right = 8 sec; 02/13/2022= 20 sec on right at best and 4 sec on left LE at best. 03/23/2022=Patient able to stand on left LE at best for 7 sec today. 04/03/2022= 27 sec on right and 12 sec on left; 06/12/2022= 13 sec on left  and 33 sec at best on right LE; 06/26/2021= 11 sec on left at best  and 22 sec on right today; 07/26/2022= 10 sec on left and 20 sec on right today.; 09/04/22: 1-2 seconds on LLE. 27 sec on RLE.    Time 12    Period Weeks    Status On-going    Target Date 09/18/2022     PT LONG TERM GOAL #7   Title Patient will safely negotiate 13 steps with handrails to safely enter/exit mothers house.    Baseline 4/20: very challenging 6/6: able to copmlete safely even managing dogs per pt report; 01/04/2022- Patient performed 16 steps with B Rail-no difficulty other than fatigue.    Time 12    Period Weeks    Status Achieved   Target Date 01/04/22    PT LONG TERM GOAL #8  Title Patient will increase six minute walk test distance to >1100 ft.  for progression to community ambulator and improve gait ability   Baseline 04/03/2022= 920 feet with 4WW - (Added another goal for endurance to continue to progress community amb distances); 05/03/2022= 960 feet with upright 4WW (patient's first time using device): 06/12/2022= 990 feet with 4WW; 06/26/2022= 1010 feet with 4WW; 07/26/2022= 1015 feet; 09/04/22: 1,020'; 09/25/2022= Will reassess next visit. 10/04/2022= 1073ft;  Time 12   Period Weeks   Status Progressing  Target Date 12/18/2022       PT LONG TERM GOAL #9  Title Patient will be able to perform functional activities such as vacuuming or using backpack sprayer with modified independence for improved household chore abilities   Baseline 06/26/2022= Patient unable to perform either task but has strong desire to return to performing if able. 07/26/2022- Patient has not attempted to vacuum yet and PT session focusing on LE strengthening.; 09/04/22: Able to perform vacuuming tasks mod-I. Has to break up tasks due to limited standing tolerance. Has not done outdoor tasks due to weather.  09/25/2022= Patient reports he was successful in vacuuming a few weeks ago- tiring but able to perform; has  not tried spraying or weedeating yet; 4/30: commenced activity simulation in session today. Still unable  to perform spraying or edging at home as desired.   Time 12   Period Weeks   Status Ongoing  Target Date 12/18/2022   PT LONG TERM GOAL #10  Title Patient will increase glute medius strength on Left LE from able to lift off 7" (sidelye on mat) to > 10" to improve Hip stability with standing, gait mechanics, and functional strength.   Baseline 06/26/2022= 7in in sidelye position. 07/26/2022- 7.5 in; 09/04/22: 8"; 09/25/2022: deferred; 4/30: deferred   Time 12   Period Weeks   Status ONGOING  Target Date 12/18/2022     Plan - 12/13/21 1340     Clinical Impression Statement Patient continued his attempt to perform some simulated household chores in controlled environment. He demonstrated good ability to walk with back pack and use rollator safely - exhibiting fatigue as limiting factor. He was fatigued as well after simulated weedeating but no significant LOB if he takes him time. He will benefit from further training/practice to improve his overall safety and endurance. Pt will continue to benefit from skilled PT services to address remaining deficits to maximize his functional independence with mobility and decrease fall risk.    Personal Factors and Comorbidities Age;Comorbidity 3+;Fitness;Past/Current Experience;Time since onset of injury/illness/exacerbation    Comorbidities aroxysmal a fib, MS, lymphedema, hypothyroidism, Graves Disease, arthritis, lumbar herniated disc (L5), depression and neuropathy    Examination-Activity Limitations Bed Mobility;Bend;Caring for Others;Carry;Reach Overhead;Locomotion Level;Lift;Hygiene/Grooming;Dressing;Squat;Stairs;Stand;Toileting;Transfers    Examination-Participation Restrictions Cleaning;Community Activity;Laundry;Occupation;Shop;Volunteer;Yard Work    Conservation officer, historic buildings Evolving/Moderate complexity    Rehab Potential Fair    PT Frequency 1-2x / week    PT Duration 12 weeks    PT Treatment/Interventions ADLs/Self Care Home  Management;Aquatic Therapy;Canalith Repostioning;Biofeedback;Cryotherapy;Ultrasound;Traction;Moist Heat;Electrical Stimulation;DME Instruction;Gait training;Therapeutic exercise;Therapeutic activities;Functional mobility training;Stair training;Balance training;Neuromuscular re-education;Patient/family education;Manual techniques;Orthotic Fit/Training;Compression bandaging;Passive range of motion;Vestibular;Taping;Splinting;Energy conservation;Dry needling;Visual/perceptual remediation/compensation    PT Next Visit Plan Continue with progessive balance training, Progressive LE strenthening as appropriate.    PT Home Exercise Plan Access Code: [email or web address] URL: [email or web address]  Added Bridges with BTB and encouraged more walking around the home during commerical breaks when watching TV.    Consulted and Agree with Plan of Care Patient            3:33 PM, 10/25/22  3:33 PM, 10/25/22 Louis Meckel, PT Physical Therapist - [facility]  Outpatient Physical Therapy- Main Campus [phone]

## 2022-10-30 ENCOUNTER — Ambulatory Visit: Payer: Medicare PPO

## 2022-11-01 ENCOUNTER — Ambulatory Visit: Payer: Medicare PPO

## 2022-11-01 DIAGNOSIS — R2681 Unsteadiness on feet: Secondary | ICD-10-CM | POA: Diagnosis not present

## 2022-11-01 DIAGNOSIS — R269 Unspecified abnormalities of gait and mobility: Secondary | ICD-10-CM

## 2022-11-01 DIAGNOSIS — R2689 Other abnormalities of gait and mobility: Secondary | ICD-10-CM

## 2022-11-01 DIAGNOSIS — R278 Other lack of coordination: Secondary | ICD-10-CM

## 2022-11-01 DIAGNOSIS — R262 Difficulty in walking, not elsewhere classified: Secondary | ICD-10-CM

## 2022-11-01 DIAGNOSIS — M6281 Muscle weakness (generalized): Secondary | ICD-10-CM | POA: Diagnosis not present

## 2022-11-01 NOTE — Therapy (Signed)
OUTPATIENT PHYSICAL THERAPY TREATMENT     Patient Name: Dustin Gray MRN: [id number] DOB:[birth year], 67 y.o., male Today's Date: 11/01/2022  PCP: Blair Heys, MD REFERRING PROVIDER: Shirlean Schlein, MD   PT End of Session - 11/01/22 1433     Visit Number 132    Number of Visits 148    Date for PT Re-Evaluation 12/18/22    Authorization Type [employer] Auth 3/26-5/30/24- 15 visits    Authorization Time Period 4/2-6/25/24    Progress Note Due on Visit 140    PT Start Time 1433    PT Stop Time 1513    PT Time Calculation (min) 40 min    Equipment Utilized During Treatment Gait belt    Activity Tolerance Patient tolerated treatment well;No increased pain    Behavior During Therapy WFL for tasks assessed/performed              Past Medical History:  Diagnosis Date   Abscess    groin   Arthritis    lower spine   Erectile dysfunction    Low testosterone    Lumbar herniated disc    L5   Multiple sclerosis (HCC)    Staph aureus infection    Past Surgical History:  Procedure Laterality Date   CARDIOVERSION N/A 11/22/2021   Procedure: CARDIOVERSION;  Surgeon: Debbe Odea, MD;  Location: [facility];  Service: Cardiovascular;  Laterality: N/A;   COLONOSCOPY WITH PROPOFOL N/A 07/04/2016   Procedure: COLONOSCOPY WITH PROPOFOL;  Surgeon: Midge Minium, MD;  Location: [facility];  Service: Endoscopy;  Laterality: N/A;   FINGER SURGERY  1964   MASS EXCISION  1960   POLYPECTOMY  07/04/2016   Procedure: POLYPECTOMY;  Surgeon: Midge Minium, MD;  Location: [facility];  Service: Endoscopy;;   TONSILLECTOMY     Patient Active Problem List   Diagnosis Date Noted   Persistent atrial fibrillation (HCC)    Graves disease    Abdominal bloating    Atrial fibrillation with RVR (HCC) 10/01/2019   Hyperthyroidism    Atrial fibrillation with rapid ventricular response (HCC) 09/30/2019   Leg edema    Left leg cellulitis    Special screening for  malignant neoplasms, colon    Polyp of sigmoid colon    Benign neoplasm of ascending colon    Rectal polyp    Herniated nucleus pulposus, L5-S1 11/09/2015   Low back pain 10/25/2015   Herniated nucleus pulposus, C5-6 right 10/06/2015   Dysesthesia 09/16/2015   Spasticity 09/16/2015   Unsteady gait 09/16/2015   Multiple sclerosis (HCC) 06/08/2011     REFERRING DIAG: R26.81 (ICD-10-CM) - Unsteadiness on feet   THERAPY DIAG:  Unsteadiness on feet  Difficulty in walking, not elsewhere classified  Abnormality of gait and mobility  Muscle weakness (generalized)  Rationale for Evaluation and Treatment Rehabilitation  PERTINENT HISTORY: Patient presents to physical therapy for unsteadiness, walking instability, fatigue. His PMH includes paroxysmal a fib, MS, lymphedema, hypothyroidism, Graves Disease, arthritis, lumbar herniated disc (L5), depression and neuropathy. Patient first developed symptoms of MS in 2005 and was diagnosed in 2006. Patient has done PT in the past, but hasn't been seen since 2020 at [facility]. Has not been doing his exercises in the past year. Walk outside to garage to ride lawnmower, walks in house. Retired Optician, dispensing. Has a rollator at home but doesn't use it in the house   PRECAUTIONS: Fall  SUBJECTIVE: Patient increased overall soreness from riding lawn mower and states back and legs are  very tight and sore. He also stated some left anterior thigh pain secondary to falling asleep and laying his leg off edge of bed for extended amount of time.    PAIN:  Are you having pain? Left proximial thigh tightness    TODAY'S TREATMENT:  Manual Therapy:  Manual stretching- knee to chest -Hamstring -Hip ER/IR -Piriformis  -Figure 4 - Lower trunk rotation - Sidelye open book rotation with overpressure Hold 30 sec x 3-4 sets each side  THEREX:  Ambulation in clinic with 4WW - reciprocal steps (reported feeling better after stretching) and no  antalgia   PATIENT EDUCATION: Education details: exercise technique, body mechanics Person educated: Patient Education method: Medical illustrator, verbal cues Education comprehension: verbalized understanding, returned demonstration    HOME EXERCISE PROGRAM: No changes this session   PT Short Term Goals -       PT SHORT TERM GOAL #1   Title Patient will be independent in home exercise program to improve strength/mobility for better functional independence with ADLs.    Baseline 8/9: HEP to be given next session, 10/31: patient reports compliance with HEP and would like progressions added next session. 08/22/2021=Patient verbalized that he is walking and doing his LE exercises with no questions at this time.    Time 4    Period Weeks    Status Achieved    Target Date 08/16/21              PT Long Term Goals       PT LONG TERM GOAL #1   Title Patient will increase FOTO score to equal to or greater than 70%  to demonstrate statistically significant improvement in mobility and quality of life.    Baseline 8/9: 64%; risk adjusted 34%; 03/21/21 FOTO: 45; 04/24/21 FOTO: 49% ; 08/22/2021= 56% 4/20: 60% 11/28/21: 63%; 01/04/2022= 60%. 03/22/2022= 73%; 10/23/22: 68   Time 12    Period Weeks    Status Achieved   Target Date 06/26/2022     PT LONG TERM GOAL #2   Title Patient will increase Berg Balance score by > 6 points to demonstrate decreased fall risk during functional activities.    Baseline 4/20: 43/56 6/6: 48/56; 713/2023= 50/56   Time 12    Period Weeks    Status Achieved   Target Date 03/29/2022     PT LONG TERM GOAL #3   Title Patient will increase 10 meter walk test to >1.85m/s as to improve gait speed for better community ambulation and to reduce fall risk.    Baseline 8/9: 0.65 m/s with rollator; 10/31: 0.68 m/s with rollator. 07/06/2021= 0.71 m/s using rollator; 08/22/2021= 0.94 m/s using rollator. 10/12/21: 1.1 m/s with rollator; 10/23/22: 0.17m/s    Time 12     Period Weeks    Status Achieved; maintaining gains    Target Date 10/11/21      PT LONG TERM GOAL #4   Title Patient will increase six minute walk test distance to >1000 for progression to community ambulator and improve gait ability    Baseline 8/9: 505 ft with rollator; 03/21/21: 742ft c rollator, 04/24/21: 736ft c rollator; 07/06/2021=900 feet; 08/22/2021= 935 feet with use of Rollator 44/20: 940 ft 11/28/21: 1055 feet with rollator   Time 12    Period Weeks    Status Achieved    Target Date 01/04/22      PT LONG TERM GOAL #5   Title Patient will increase glute medius strength on Left LE from able to  lift off 3" (sidelye on mat) to > 5" to improve stability, gait mechanics, and functional strength.    Baseline 10/31: 3-/5; 07/06/2021= 3-/5; 08/22/2021=3-/5 unable to achieve full ROM yet able to withstand some resistance with testing 4/20: unable to obtainfull ROM against gravity 6/6: unable to move against gravity; 01/04/2022= Continues to have difficulty - unable to raise left LE in sidelye against gravity but able to perform supine left Hip abd with some resistance= 3-/5; 03/22/2022- Patient able to raise his left LE 3 in off mat today. 04/03/2022= Patient able to raise left LE 3" off mat in sidelye today; 05/01/2022= 05/01/2022= patient able to raise left LE from sidelye position= 5"; 06/12/2022- Patient presents with 6.25 in sidelye Left hip abd against gravity. 06/26/2022= 7in in sidelye position.    Time 12    Period Weeks    Status MET   Target Date 06/26/2022     PT LONG TERM GOAL #6   Title Patient will increase Left single leg stance time to 15 seconds or greater to increase safety in shower and independence with ADLs.    Baseline 10/31; 1.83 sec without UE support; 08/22/2021=2-3  sec  on left; 6 sec on right 4/20: RLE 10 seconds LLE unable to perform 6/6: R LE 4 seconds during BERG; 01/04/2022= left = 3 sec and right = 8 sec; 02/13/2022= 20 sec on right at best and 4 sec on left LE at best.  03/23/2022=Patient able to stand on left LE at best for 7 sec today. 04/03/2022= 27 sec on right and 12 sec on left; 06/12/2022= 13 sec on left  and 33 sec at best on right LE; 06/26/2021= 11 sec on left at best and 22 sec on right today; 07/26/2022= 10 sec on left and 20 sec on right today.; 09/04/22: 1-2 seconds on LLE. 27 sec on RLE.    Time 12    Period Weeks    Status On-going    Target Date 09/18/2022     PT LONG TERM GOAL #7   Title Patient will safely negotiate 13 steps with handrails to safely enter/exit mothers house.    Baseline 4/20: very challenging 6/6: able to copmlete safely even managing dogs per pt report; 01/04/2022- Patient performed 16 steps with B Rail-no difficulty other than fatigue.    Time 12    Period Weeks    Status Achieved   Target Date 01/04/22    PT LONG TERM GOAL #8  Title Patient will increase six minute walk test distance to >1100 ft.  for progression to community ambulator and improve gait ability   Baseline 04/03/2022= 920 feet with 4WW - (Added another goal for endurance to continue to progress community amb distances); 05/03/2022= 960 feet with upright 4WW (patient's first time using device): 06/12/2022= 990 feet with 4WW; 06/26/2022= 1010 feet with 4WW; 07/26/2022= 1015 feet; 09/04/22: 1,020'; 09/25/2022= Will reassess next visit. 10/04/2022= 1074ft;  Time 12   Period Weeks   Status Progressing  Target Date 12/18/2022       PT LONG TERM GOAL #9  Title Patient will be able to perform functional activities such as vacuuming or using backpack sprayer with modified independence for improved household chore abilities   Baseline 06/26/2022= Patient unable to perform either task but has strong desire to return to performing if able. 07/26/2022- Patient has not attempted to vacuum yet and PT session focusing on LE strengthening.; 09/04/22: Able to perform vacuuming tasks mod-I. Has to break up tasks due to  limited standing tolerance. Has not done outdoor tasks due to weather.   09/25/2022= Patient reports he was successful in vacuuming a few weeks ago- tiring but able to perform; has not tried spraying or weedeating yet; 4/30: commenced activity simulation in session today. Still unable to perform spraying or edging at home as desired.   Time 12   Period Weeks   Status Ongoing  Target Date 12/18/2022   PT LONG TERM GOAL #10  Title Patient will increase glute medius strength on Left LE from able to lift off 7" (sidelye on mat) to > 10" to improve Hip stability with standing, gait mechanics, and functional strength.   Baseline 06/26/2022= 7in in sidelye position. 07/26/2022- 7.5 in; 09/04/22: 8"; 09/25/2022: deferred; 4/30: deferred   Time 12   Period Weeks   Status ONGOING  Target Date 12/18/2022     Plan - 12/13/21 1340     Clinical Impression Statement Treatment modified today due to patient having increased overall pain in left hip flexor. He responded well to stretching with some improvement in overall tightness. He was able to  walk better after treatment. Pt will continue to benefit from skilled PT services to address remaining deficits to maximize his functional independence with mobility and decrease fall risk.    Personal Factors and Comorbidities Age;Comorbidity 3+;Fitness;Past/Current Experience;Time since onset of injury/illness/exacerbation    Comorbidities aroxysmal a fib, MS, lymphedema, hypothyroidism, Graves Disease, arthritis, lumbar herniated disc (L5), depression and neuropathy    Examination-Activity Limitations Bed Mobility;Bend;Caring for Others;Carry;Reach Overhead;Locomotion Level;Lift;Hygiene/Grooming;Dressing;Squat;Stairs;Stand;Toileting;Transfers    Examination-Participation Restrictions Cleaning;Community Activity;Laundry;Occupation;Shop;Volunteer;Yard Work    Conservation officer, historic buildings Evolving/Moderate complexity    Rehab Potential Fair    PT Frequency 1-2x / week    PT Duration 12 weeks    PT Treatment/Interventions ADLs/Self Care Home  Management;Aquatic Therapy;Canalith Repostioning;Biofeedback;Cryotherapy;Ultrasound;Traction;Moist Heat;Electrical Stimulation;DME Instruction;Gait training;Therapeutic exercise;Therapeutic activities;Functional mobility training;Stair training;Balance training;Neuromuscular re-education;Patient/family education;Manual techniques;Orthotic Fit/Training;Compression bandaging;Passive range of motion;Vestibular;Taping;Splinting;Energy conservation;Dry needling;Visual/perceptual remediation/compensation    PT Next Visit Plan Continue with progessive balance training, Progressive LE strenthening as appropriate.    PT Home Exercise Plan Access Code: [email or web address] URL: [email or web address]  Added Bridges with BTB and encouraged more walking around the home during commerical breaks when watching TV.    Consulted and Agree with Plan of Care Patient            3:28 PM, 11/01/22  3:28 PM, 11/01/22 Louis Meckel, PT Physical Therapist - [facility]  Outpatient Physical Therapy- Main Campus [phone]

## 2022-11-06 ENCOUNTER — Ambulatory Visit: Payer: Medicare PPO

## 2022-11-06 DIAGNOSIS — R262 Difficulty in walking, not elsewhere classified: Secondary | ICD-10-CM

## 2022-11-06 DIAGNOSIS — R269 Unspecified abnormalities of gait and mobility: Secondary | ICD-10-CM

## 2022-11-06 DIAGNOSIS — R2681 Unsteadiness on feet: Secondary | ICD-10-CM

## 2022-11-06 DIAGNOSIS — M6281 Muscle weakness (generalized): Secondary | ICD-10-CM

## 2022-11-06 DIAGNOSIS — R278 Other lack of coordination: Secondary | ICD-10-CM | POA: Diagnosis not present

## 2022-11-06 DIAGNOSIS — R2689 Other abnormalities of gait and mobility: Secondary | ICD-10-CM | POA: Diagnosis not present

## 2022-11-06 NOTE — Therapy (Signed)
OUTPATIENT PHYSICAL THERAPY TREATMENT     Patient Name: Dustin Gray MRN: [id number] DOB:[birth year], 67 y.o., male Today's Date: 11/01/2022  PCP: Blair Heys, MD REFERRING PROVIDER: Shirlean Schlein, MD   PT End of Session - 11/01/22 1433     Visit Number 132    Number of Visits 148    Date for PT Re-Evaluation 12/18/22    Authorization Type [employer] Auth 3/26-5/30/24- 15 visits    Authorization Time Period 4/2-6/25/24    Progress Note Due on Visit 140    PT Start Time 1433    PT Stop Time 1513    PT Time Calculation (min) 40 min    Equipment Utilized During Treatment Gait belt    Activity Tolerance Patient tolerated treatment well;No increased pain    Behavior During Therapy WFL for tasks assessed/performed              Past Medical History:  Diagnosis Date   Abscess    groin   Arthritis    lower spine   Erectile dysfunction    Low testosterone    Lumbar herniated disc    L5   Multiple sclerosis (HCC)    Staph aureus infection    Past Surgical History:  Procedure Laterality Date   CARDIOVERSION N/A 11/22/2021   Procedure: CARDIOVERSION;  Surgeon: Debbe Odea, MD;  Location: [facility];  Service: Cardiovascular;  Laterality: N/A;   COLONOSCOPY WITH PROPOFOL N/A 07/04/2016   Procedure: COLONOSCOPY WITH PROPOFOL;  Surgeon: Midge Minium, MD;  Location: [facility];  Service: Endoscopy;  Laterality: N/A;   FINGER SURGERY  1964   MASS EXCISION  1960   POLYPECTOMY  07/04/2016   Procedure: POLYPECTOMY;  Surgeon: Midge Minium, MD;  Location: [facility];  Service: Endoscopy;;   TONSILLECTOMY     Patient Active Problem List   Diagnosis Date Noted   Persistent atrial fibrillation (HCC)    Graves disease    Abdominal bloating    Atrial fibrillation with RVR (HCC) 10/01/2019   Hyperthyroidism    Atrial fibrillation with rapid ventricular response (HCC) 09/30/2019   Leg edema    Left leg cellulitis    Special screening for  malignant neoplasms, colon    Polyp of sigmoid colon    Benign neoplasm of ascending colon    Rectal polyp    Herniated nucleus pulposus, L5-S1 11/09/2015   Low back pain 10/25/2015   Herniated nucleus pulposus, C5-6 right 10/06/2015   Dysesthesia 09/16/2015   Spasticity 09/16/2015   Unsteady gait 09/16/2015   Multiple sclerosis (HCC) 06/08/2011     REFERRING DIAG: R26.81 (ICD-10-CM) - Unsteadiness on feet   THERAPY DIAG:  Unsteadiness on feet  Difficulty in walking, not elsewhere classified  Abnormality of gait and mobility  Muscle weakness (generalized)  Rationale for Evaluation and Treatment Rehabilitation  PERTINENT HISTORY: Patient presents to physical therapy for unsteadiness, walking instability, fatigue. His PMH includes paroxysmal a fib, MS, lymphedema, hypothyroidism, Graves Disease, arthritis, lumbar herniated disc (L5), depression and neuropathy. Patient first developed symptoms of MS in 2005 and was diagnosed in 2006. Patient has done PT in the past, but hasn't been seen since 2020 at [facility]. Has not been doing his exercises in the past year. Walk outside to garage to ride lawnmower, walks in house. Retired Optician, dispensing. Has a rollator at home but doesn't use it in the house   PRECAUTIONS: Fall  SUBJECTIVE: Patient increased overall soreness from riding lawn mower and states back and legs are  very tight and sore. He also stated some left anterior thigh pain secondary to falling asleep and laying his leg off edge of bed for extended amount of time.    PAIN:  Are you having pain? Left proximial thigh tightness    TODAY'S TREATMENT:  THEREX:  Sit to Stand from varying heights-   3 sets of 3 reps at 26", then 24", then 22"   Quadraped - Academic librarian- HIP ER- x 10 reps each   Modified donkey kick (leaning forward onto mat raised high)   Modified ham curl (leaning forward onto raised mat table)   Calf raises x 15 reps  NMR:   Activity  Description: The Blaze Pod All At Once setting was selected to create a dynamic environment for comprehensive, full-body workouts that challenge users with simultaneous activation of all Pods, fostering agility and enhancing reaction time.    Activity Setting:  all at once Number of Pods:  5 Cycles/Sets:  5 Duration (Time or Hit Count):  Time  Patient Stats  Reaction Time:  16.32, 16.87 sec, 16.22, 15.22 OR   Forward/retro walk- without UE- 6 steps total forward and 8-10 steps backward     PATIENT EDUCATION: Education details: exercise technique, body mechanics Person educated: Patient Education method: Medical illustrator, verbal cues Education comprehension: verbalized understanding, returned demonstration    HOME EXERCISE PROGRAM: No changes this session   PT Short Term Goals -       PT SHORT TERM GOAL #1   Title Patient will be independent in home exercise program to improve strength/mobility for better functional independence with ADLs.    Baseline 8/9: HEP to be given next session, 10/31: patient reports compliance with HEP and would like progressions added next session. 08/22/2021=Patient verbalized that he is walking and doing his LE exercises with no questions at this time.    Time 4    Period Weeks    Status Achieved    Target Date 08/16/21              PT Long Term Goals       PT LONG TERM GOAL #1   Title Patient will increase FOTO score to equal to or greater than 70%  to demonstrate statistically significant improvement in mobility and quality of life.    Baseline 8/9: 64%; risk adjusted 34%; 03/21/21 FOTO: 45; 04/24/21 FOTO: 49% ; 08/22/2021= 56% 4/20: 60% 11/28/21: 63%; 01/04/2022= 60%. 03/22/2022= 73%; 10/23/22: 68   Time 12    Period Weeks    Status Achieved   Target Date 06/26/2022     PT LONG TERM GOAL #2   Title Patient will increase Berg Balance score by > 6 points to demonstrate decreased fall risk during functional activities.    Baseline  4/20: 43/56 6/6: 48/56; 713/2023= 50/56   Time 12    Period Weeks    Status Achieved   Target Date 03/29/2022     PT LONG TERM GOAL #3   Title Patient will increase 10 meter walk test to >1.49m/s as to improve gait speed for better community ambulation and to reduce fall risk.    Baseline 8/9: 0.65 m/s with rollator; 10/31: 0.68 m/s with rollator. 07/06/2021= 0.71 m/s using rollator; 08/22/2021= 0.94 m/s using rollator. 10/12/21: 1.1 m/s with rollator; 10/23/22: 0.17m/s    Time 12    Period Weeks    Status Achieved; maintaining gains    Target Date 10/11/21      PT LONG TERM GOAL #4  Title Patient will increase six minute walk test distance to >1000 for progression to community ambulator and improve gait ability    Baseline 8/9: 505 ft with rollator; 03/21/21: 719ft c rollator, 04/24/21: 757ft c rollator; 07/06/2021=900 feet; 08/22/2021= 935 feet with use of Rollator 44/20: 940 ft 11/28/21: 1055 feet with rollator   Time 12    Period Weeks    Status Achieved    Target Date 01/04/22      PT LONG TERM GOAL #5   Title Patient will increase glute medius strength on Left LE from able to lift off 3" (sidelye on mat) to > 5" to improve stability, gait mechanics, and functional strength.    Baseline 10/31: 3-/5; 07/06/2021= 3-/5; 08/22/2021=3-/5 unable to achieve full ROM yet able to withstand some resistance with testing 4/20: unable to obtainfull ROM against gravity 6/6: unable to move against gravity; 01/04/2022= Continues to have difficulty - unable to raise left LE in sidelye against gravity but able to perform supine left Hip abd with some resistance= 3-/5; 03/22/2022- Patient able to raise his left LE 3 in off mat today. 04/03/2022= Patient able to raise left LE 3" off mat in sidelye today; 05/01/2022= 05/01/2022= patient able to raise left LE from sidelye position= 5"; 06/12/2022- Patient presents with 6.25 in sidelye Left hip abd against gravity. 06/26/2022= 7in in sidelye position.    Time 12    Period  Weeks    Status MET   Target Date 06/26/2022     PT LONG TERM GOAL #6   Title Patient will increase Left single leg stance time to 15 seconds or greater to increase safety in shower and independence with ADLs.    Baseline 10/31; 1.83 sec without UE support; 08/22/2021=2-3  sec  on left; 6 sec on right 4/20: RLE 10 seconds LLE unable to perform 6/6: R LE 4 seconds during BERG; 01/04/2022= left = 3 sec and right = 8 sec; 02/13/2022= 20 sec on right at best and 4 sec on left LE at best. 03/23/2022=Patient able to stand on left LE at best for 7 sec today. 04/03/2022= 27 sec on right and 12 sec on left; 06/12/2022= 13 sec on left  and 33 sec at best on right LE; 06/26/2021= 11 sec on left at best and 22 sec on right today; 07/26/2022= 10 sec on left and 20 sec on right today.; 09/04/22: 1-2 seconds on LLE. 27 sec on RLE.    Time 12    Period Weeks    Status On-going    Target Date 09/18/2022     PT LONG TERM GOAL #7   Title Patient will safely negotiate 13 steps with handrails to safely enter/exit mothers house.    Baseline 4/20: very challenging 6/6: able to copmlete safely even managing dogs per pt report; 01/04/2022- Patient performed 16 steps with B Rail-no difficulty other than fatigue.    Time 12    Period Weeks    Status Achieved   Target Date 01/04/22    PT LONG TERM GOAL #8  Title Patient will increase six minute walk test distance to >1100 ft.  for progression to community ambulator and improve gait ability   Baseline 04/03/2022= 920 feet with 4WW - (Added another goal for endurance to continue to progress community amb distances); 05/03/2022= 960 feet with upright 4WW (patient's first time using device): 06/12/2022= 990 feet with 4WW; 06/26/2022= 1010 feet with 4WW; 07/26/2022= 1015 feet; 09/04/22: 1,020'; 09/25/2022= Will reassess next visit. 10/04/2022= 1022ft;  Time 12   Period Weeks   Status Progressing  Target Date 12/18/2022       PT LONG TERM GOAL #9  Title Patient will be able to perform  functional activities such as vacuuming or using backpack sprayer with modified independence for improved household chore abilities   Baseline 06/26/2022= Patient unable to perform either task but has strong desire to return to performing if able. 07/26/2022- Patient has not attempted to vacuum yet and PT session focusing on LE strengthening.; 09/04/22: Able to perform vacuuming tasks mod-I. Has to break up tasks due to limited standing tolerance. Has not done outdoor tasks due to weather.  09/25/2022= Patient reports he was successful in vacuuming a few weeks ago- tiring but able to perform; has not tried spraying or weedeating yet; 4/30: commenced activity simulation in session today. Still unable to perform spraying or edging at home as desired.   Time 12   Period Weeks   Status Ongoing  Target Date 12/18/2022   PT LONG TERM GOAL #10  Title Patient will increase glute medius strength on Left LE from able to lift off 7" (sidelye on mat) to > 10" to improve Hip stability with standing, gait mechanics, and functional strength.   Baseline 06/26/2022= 7in in sidelye position. 07/26/2022- 7.5 in; 09/04/22: 8"; 09/25/2022: deferred; 4/30: deferred   Time 12   Period Weeks   Status ONGOING  Target Date 12/18/2022     Plan - 12/13/21 1340     Clinical Impression Statement Treatment modified today due to patient having increased overall pain in left hip flexor. He responded well to stretching with some improvement in overall tightness. He was able to  walk better after treatment. Pt will continue to benefit from skilled PT services to address remaining deficits to maximize his functional independence with mobility and decrease fall risk.    Personal Factors and Comorbidities Age;Comorbidity 3+;Fitness;Past/Current Experience;Time since onset of injury/illness/exacerbation    Comorbidities aroxysmal a fib, MS, lymphedema, hypothyroidism, Graves Disease, arthritis, lumbar herniated disc (L5), depression and neuropathy     Examination-Activity Limitations Bed Mobility;Bend;Caring for Others;Carry;Reach Overhead;Locomotion Level;Lift;Hygiene/Grooming;Dressing;Squat;Stairs;Stand;Toileting;Transfers    Examination-Participation Restrictions Cleaning;Community Activity;Laundry;Occupation;Shop;Volunteer;Yard Work    Conservation officer, historic buildings Evolving/Moderate complexity    Rehab Potential Fair    PT Frequency 1-2x / week    PT Duration 12 weeks    PT Treatment/Interventions ADLs/Self Care Home Management;Aquatic Therapy;Canalith Repostioning;Biofeedback;Cryotherapy;Ultrasound;Traction;Moist Heat;Electrical Stimulation;DME Instruction;Gait training;Therapeutic exercise;Therapeutic activities;Functional mobility training;Stair training;Balance training;Neuromuscular re-education;Patient/family education;Manual techniques;Orthotic Fit/Training;Compression bandaging;Passive range of motion;Vestibular;Taping;Splinting;Energy conservation;Dry needling;Visual/perceptual remediation/compensation    PT Next Visit Plan Continue with progessive balance training, Progressive LE strenthening as appropriate.    PT Home Exercise Plan Access Code: [email or web address] URL: [email or web address]  Added Bridges with BTB and encouraged more walking around the home during commerical breaks when watching TV.    Consulted and Agree with Plan of Care Patient            3:28 PM, 11/01/22  3:28 PM, 11/01/22 Louis Meckel, PT Physical Therapist - [facility]  Outpatient Physical Therapy- Main Campus [phone]

## 2022-11-08 ENCOUNTER — Ambulatory Visit: Payer: Medicare PPO

## 2022-11-08 DIAGNOSIS — R269 Unspecified abnormalities of gait and mobility: Secondary | ICD-10-CM

## 2022-11-08 DIAGNOSIS — R262 Difficulty in walking, not elsewhere classified: Secondary | ICD-10-CM | POA: Diagnosis not present

## 2022-11-08 DIAGNOSIS — R278 Other lack of coordination: Secondary | ICD-10-CM

## 2022-11-08 DIAGNOSIS — R2681 Unsteadiness on feet: Secondary | ICD-10-CM

## 2022-11-08 DIAGNOSIS — M6281 Muscle weakness (generalized): Secondary | ICD-10-CM

## 2022-11-08 DIAGNOSIS — R2689 Other abnormalities of gait and mobility: Secondary | ICD-10-CM | POA: Diagnosis not present

## 2022-11-08 NOTE — Therapy (Signed)
OUTPATIENT PHYSICAL THERAPY TREATMENT     Patient Name: Dustin Gray MRN: [id number] DOB:[birth year], 67 y.o., male Today's Date: 11/09/2022  PCP: Blair Heys, MD REFERRING PROVIDER: Shirlean Schlein, MD   PT End of Session - 11/08/22 1435     Visit Number 134    Number of Visits 148    Date for PT Re-Evaluation 12/18/22    Authorization Type [employer] Auth 3/26-5/30/24- 15 visits    Authorization Time Period 4/2-6/25/24    Progress Note Due on Visit 140    PT Start Time 1433    PT Stop Time 1514    PT Time Calculation (min) 41 min    Equipment Utilized During Treatment Gait belt    Activity Tolerance Patient tolerated treatment well;No increased pain    Behavior During Therapy WFL for tasks assessed/performed              Past Medical History:  Diagnosis Date   Abscess    groin   Arthritis    lower spine   Erectile dysfunction    Low testosterone    Lumbar herniated disc    L5   Multiple sclerosis (HCC)    Staph aureus infection    Past Surgical History:  Procedure Laterality Date   CARDIOVERSION N/A 11/22/2021   Procedure: CARDIOVERSION;  Surgeon: Debbe Odea, MD;  Location: [facility];  Service: Cardiovascular;  Laterality: N/A;   COLONOSCOPY WITH PROPOFOL N/A 07/04/2016   Procedure: COLONOSCOPY WITH PROPOFOL;  Surgeon: Midge Minium, MD;  Location: [facility];  Service: Endoscopy;  Laterality: N/A;   FINGER SURGERY  1964   MASS EXCISION  1960   POLYPECTOMY  07/04/2016   Procedure: POLYPECTOMY;  Surgeon: Midge Minium, MD;  Location: [facility];  Service: Endoscopy;;   TONSILLECTOMY     Patient Active Problem List   Diagnosis Date Noted   Persistent atrial fibrillation (HCC)    Graves disease    Abdominal bloating    Atrial fibrillation with RVR (HCC) 10/01/2019   Hyperthyroidism    Atrial fibrillation with rapid ventricular response (HCC) 09/30/2019   Leg edema    Left leg cellulitis    Special screening for  malignant neoplasms, colon    Polyp of sigmoid colon    Benign neoplasm of ascending colon    Rectal polyp    Herniated nucleus pulposus, L5-S1 11/09/2015   Low back pain 10/25/2015   Herniated nucleus pulposus, C5-6 right 10/06/2015   Dysesthesia 09/16/2015   Spasticity 09/16/2015   Unsteady gait 09/16/2015   Multiple sclerosis (HCC) 06/08/2011     REFERRING DIAG: R26.81 (ICD-10-CM) - Unsteadiness on feet   THERAPY DIAG:  Unsteadiness on feet  Difficulty in walking, not elsewhere classified  Abnormality of gait and mobility  Muscle weakness (generalized)  Rationale for Evaluation and Treatment Rehabilitation  PERTINENT HISTORY: Patient presents to physical therapy for unsteadiness, walking instability, fatigue. His PMH includes paroxysmal a fib, MS, lymphedema, hypothyroidism, Graves Disease, arthritis, lumbar herniated disc (L5), depression and neuropathy. Patient first developed symptoms of MS in 2005 and was diagnosed in 2006. Patient has done PT in the past, but hasn't been seen since 2020 at [facility]. Has not been doing his exercises in the past year. Walk outside to garage to ride lawnmower, walks in house. Retired Optician, dispensing. Has a rollator at home but doesn't use it in the house   PRECAUTIONS: Fall  SUBJECTIVE: Patient reports feels like his left LE is getting weaker despite working hard to  gain some strength over past week.  Are you having pain? no   TODAY'S TREATMENT:  THEREX:   Patient performed combination of walking using rollator and use of 5# AW each LE - in hospital hallways and outpatient clinic- stopping along the way to perform the following exercises:  High knee march - 15 reps each LE Sit to stand without UE support x 15 reps Hip abd - 15 reps each  LE Hip ext- 15 reps each LE Total distance walked - over 1800 feet - with stops for exercise and brief rest     PATIENT EDUCATION: Education details: exercise technique, body  mechanics Person educated: Patient Education method: Medical illustrator, verbal cues Education comprehension: verbalized understanding, returned demonstration    HOME EXERCISE PROGRAM: No changes this session   PT Short Term Goals -       PT SHORT TERM GOAL #1   Title Patient will be independent in home exercise program to improve strength/mobility for better functional independence with ADLs.    Baseline 8/9: HEP to be given next session, 10/31: patient reports compliance with HEP and would like progressions added next session. 08/22/2021=Patient verbalized that he is walking and doing his LE exercises with no questions at this time.    Time 4    Period Weeks    Status Achieved    Target Date 08/16/21              PT Long Term Goals       PT LONG TERM GOAL #1   Title Patient will increase FOTO score to equal to or greater than 70%  to demonstrate statistically significant improvement in mobility and quality of life.    Baseline 8/9: 64%; risk adjusted 34%; 03/21/21 FOTO: 45; 04/24/21 FOTO: 49% ; 08/22/2021= 56% 4/20: 60% 11/28/21: 63%; 01/04/2022= 60%. 03/22/2022= 73%; 10/23/22: 68   Time 12    Period Weeks    Status Achieved   Target Date 06/26/2022     PT LONG TERM GOAL #2   Title Patient will increase Berg Balance score by > 6 points to demonstrate decreased fall risk during functional activities.    Baseline 4/20: 43/56 6/6: 48/56; 713/2023= 50/56   Time 12    Period Weeks    Status Achieved   Target Date 03/29/2022     PT LONG TERM GOAL #3   Title Patient will increase 10 meter walk test to >1.61m/s as to improve gait speed for better community ambulation and to reduce fall risk.    Baseline 8/9: 0.65 m/s with rollator; 10/31: 0.68 m/s with rollator. 07/06/2021= 0.71 m/s using rollator; 08/22/2021= 0.94 m/s using rollator. 10/12/21: 1.1 m/s with rollator; 10/23/22: 0.78m/s    Time 12    Period Weeks    Status Achieved; maintaining gains    Target Date 10/11/21       PT LONG TERM GOAL #4   Title Patient will increase six minute walk test distance to >1000 for progression to community ambulator and improve gait ability    Baseline 8/9: 505 ft with rollator; 03/21/21: 710ft c rollator, 04/24/21: 778ft c rollator; 07/06/2021=900 feet; 08/22/2021= 935 feet with use of Rollator 44/20: 940 ft 11/28/21: 1055 feet with rollator   Time 12    Period Weeks    Status Achieved    Target Date 01/04/22      PT LONG TERM GOAL #5   Title Patient will increase glute medius strength on Left LE from able to lift off  3" (sidelye on mat) to > 5" to improve stability, gait mechanics, and functional strength.    Baseline 10/31: 3-/5; 07/06/2021= 3-/5; 08/22/2021=3-/5 unable to achieve full ROM yet able to withstand some resistance with testing 4/20: unable to obtainfull ROM against gravity 6/6: unable to move against gravity; 01/04/2022= Continues to have difficulty - unable to raise left LE in sidelye against gravity but able to perform supine left Hip abd with some resistance= 3-/5; 03/22/2022- Patient able to raise his left LE 3 in off mat today. 04/03/2022= Patient able to raise left LE 3" off mat in sidelye today; 05/01/2022= 05/01/2022= patient able to raise left LE from sidelye position= 5"; 06/12/2022- Patient presents with 6.25 in sidelye Left hip abd against gravity. 06/26/2022= 7in in sidelye position.    Time 12    Period Weeks    Status MET   Target Date 06/26/2022     PT LONG TERM GOAL #6   Title Patient will increase Left single leg stance time to 15 seconds or greater to increase safety in shower and independence with ADLs.    Baseline 10/31; 1.83 sec without UE support; 08/22/2021=2-3  sec  on left; 6 sec on right 4/20: RLE 10 seconds LLE unable to perform 6/6: R LE 4 seconds during BERG; 01/04/2022= left = 3 sec and right = 8 sec; 02/13/2022= 20 sec on right at best and 4 sec on left LE at best. 03/23/2022=Patient able to stand on left LE at best for 7 sec today. 04/03/2022= 27  sec on right and 12 sec on left; 06/12/2022= 13 sec on left  and 33 sec at best on right LE; 06/26/2021= 11 sec on left at best and 22 sec on right today; 07/26/2022= 10 sec on left and 20 sec on right today.; 09/04/22: 1-2 seconds on LLE. 27 sec on RLE.    Time 12    Period Weeks    Status On-going    Target Date 09/18/2022     PT LONG TERM GOAL #7   Title Patient will safely negotiate 13 steps with handrails to safely enter/exit mothers house.    Baseline 4/20: very challenging 6/6: able to copmlete safely even managing dogs per pt report; 01/04/2022- Patient performed 16 steps with B Rail-no difficulty other than fatigue.    Time 12    Period Weeks    Status Achieved   Target Date 01/04/22    PT LONG TERM GOAL #8  Title Patient will increase six minute walk test distance to >1100 ft.  for progression to community ambulator and improve gait ability   Baseline 04/03/2022= 920 feet with 4WW - (Added another goal for endurance to continue to progress community amb distances); 05/03/2022= 960 feet with upright 4WW (patient's first time using device): 06/12/2022= 990 feet with 4WW; 06/26/2022= 1010 feet with 4WW; 07/26/2022= 1015 feet; 09/04/22: 1,020'; 09/25/2022= Will reassess next visit. 10/04/2022= 1032ft;  Time 12   Period Weeks   Status Progressing  Target Date 12/18/2022       PT LONG TERM GOAL #9  Title Patient will be able to perform functional activities such as vacuuming or using backpack sprayer with modified independence for improved household chore abilities   Baseline 06/26/2022= Patient unable to perform either task but has strong desire to return to performing if able. 07/26/2022- Patient has not attempted to vacuum yet and PT session focusing on LE strengthening.; 09/04/22: Able to perform vacuuming tasks mod-I. Has to break up tasks due to limited standing  tolerance. Has not done outdoor tasks due to weather.  09/25/2022= Patient reports he was successful in vacuuming a few weeks ago- tiring but  able to perform; has not tried spraying or weedeating yet; 4/30: commenced activity simulation in session today. Still unable to perform spraying or edging at home as desired.   Time 12   Period Weeks   Status Ongoing  Target Date 12/18/2022   PT LONG TERM GOAL #10  Title Patient will increase glute medius strength on Left LE from able to lift off 7" (sidelye on mat) to > 10" to improve Hip stability with standing, gait mechanics, and functional strength.   Baseline 06/26/2022= 7in in sidelye position. 07/26/2022- 7.5 in; 09/04/22: 8"; 09/25/2022: deferred; 4/30: deferred   Time 12   Period Weeks   Status ONGOING  Target Date 12/18/2022     Plan - 12/13/21 1340     Clinical Impression Statement Patient presented with good motivation for today's session. Treatment focused on overall functional endurance as patient spent entire session ambulating on level surfaces inside of hospital while stopping to perform some LE strengthening then continue with walking with resistance of 5# AW today and only minimal rest. He was fatigued at end of session but ambulated over 1800 feet and states fatigue as limiting factor- no difficulty with left foot clearance even with fatigue and use of ankle weight today. Pt will continue to benefit from skilled PT services to address remaining deficits to maximize his functional independence with mobility and decrease fall risk.    Personal Factors and Comorbidities Age;Comorbidity 3+;Fitness;Past/Current Experience;Time since onset of injury/illness/exacerbation    Comorbidities aroxysmal a fib, MS, lymphedema, hypothyroidism, Graves Disease, arthritis, lumbar herniated disc (L5), depression and neuropathy    Examination-Activity Limitations Bed Mobility;Bend;Caring for Others;Carry;Reach Overhead;Locomotion Level;Lift;Hygiene/Grooming;Dressing;Squat;Stairs;Stand;Toileting;Transfers    Examination-Participation Restrictions Cleaning;Community  Activity;Laundry;Occupation;Shop;Volunteer;Yard Work    Conservation officer, historic buildings Evolving/Moderate complexity    Rehab Potential Fair    PT Frequency 1-2x / week    PT Duration 12 weeks    PT Treatment/Interventions ADLs/Self Care Home Management;Aquatic Therapy;Canalith Repostioning;Biofeedback;Cryotherapy;Ultrasound;Traction;Moist Heat;Electrical Stimulation;DME Instruction;Gait training;Therapeutic exercise;Therapeutic activities;Functional mobility training;Stair training;Balance training;Neuromuscular re-education;Patient/family education;Manual techniques;Orthotic Fit/Training;Compression bandaging;Passive range of motion;Vestibular;Taping;Splinting;Energy conservation;Dry needling;Visual/perceptual remediation/compensation    PT Next Visit Plan Continue with progessive balance training, Progressive LE strenthening as appropriate.    PT Home Exercise Plan Access Code: [email or web address] URL: [email or web address]  Added Bridges with BTB and encouraged more walking around the home during commerical breaks when watching TV.    Consulted and Agree with Plan of Care Patient            11:07 AM, 11/09/22  11:07 AM, 11/09/22 Louis Meckel, PT Physical Therapist -  [facility]  Outpatient Physical Therapy- Main Campus [phone]

## 2022-11-13 ENCOUNTER — Ambulatory Visit: Payer: Medicare PPO

## 2022-11-13 DIAGNOSIS — R2689 Other abnormalities of gait and mobility: Secondary | ICD-10-CM | POA: Diagnosis not present

## 2022-11-13 DIAGNOSIS — R2681 Unsteadiness on feet: Secondary | ICD-10-CM

## 2022-11-13 DIAGNOSIS — R269 Unspecified abnormalities of gait and mobility: Secondary | ICD-10-CM | POA: Diagnosis not present

## 2022-11-13 DIAGNOSIS — R278 Other lack of coordination: Secondary | ICD-10-CM | POA: Diagnosis not present

## 2022-11-13 DIAGNOSIS — M6281 Muscle weakness (generalized): Secondary | ICD-10-CM

## 2022-11-13 DIAGNOSIS — R262 Difficulty in walking, not elsewhere classified: Secondary | ICD-10-CM

## 2022-11-13 NOTE — Therapy (Signed)
OUTPATIENT PHYSICAL THERAPY TREATMENT     Patient Name: Dustin Gray MRN: [id number] DOB:Mar 14, 1956, 67 y.o., male Today's Date: 11/13/2022  PCP: Blair Heys, MD REFERRING PROVIDER: Shirlean Schlein, MD   PT End of Session - 11/13/22 1443     Visit Number 135    Number of Visits 148    Date for PT Re-Evaluation 12/18/22    Authorization Type [employer] Auth 3/26-5/30/24- 15 visits    Authorization Time Period 4/2-6/25/24    Progress Note Due on Visit 140    PT Start Time 1436    PT Stop Time 1515    PT Time Calculation (min) 39 min    Equipment Utilized During Treatment Gait belt    Activity Tolerance Patient tolerated treatment well;No increased pain    Behavior During Therapy WFL for tasks assessed/performed              Past Medical History:  Diagnosis Date   Abscess    groin   Arthritis    lower spine   Erectile dysfunction    Low testosterone    Lumbar herniated disc    L5   Multiple sclerosis (HCC)    Staph aureus infection    Past Surgical History:  Procedure Laterality Date   CARDIOVERSION N/A 11/22/2021   Procedure: CARDIOVERSION;  Surgeon: Debbe Odea, MD;  Location: [facility];  Service: Cardiovascular;  Laterality: N/A;   COLONOSCOPY WITH PROPOFOL N/A 07/04/2016   Procedure: COLONOSCOPY WITH PROPOFOL;  Surgeon: Midge Minium, MD;  Location: [facility];  Service: Endoscopy;  Laterality: N/A;   FINGER SURGERY  1964   MASS EXCISION  1960   POLYPECTOMY  07/04/2016   Procedure: POLYPECTOMY;  Surgeon: Midge Minium, MD;  Location: [facility];  Service: Endoscopy;;   TONSILLECTOMY     Patient Active Problem List   Diagnosis Date Noted   Persistent atrial fibrillation (HCC)    Graves disease    Abdominal bloating    Atrial fibrillation with RVR (HCC) 10/01/2019   Hyperthyroidism    Atrial fibrillation with rapid ventricular response (HCC) 09/30/2019   Leg edema    Left leg cellulitis    Special screening for  malignant neoplasms, colon    Polyp of sigmoid colon    Benign neoplasm of ascending colon    Rectal polyp    Herniated nucleus pulposus, L5-S1 11/09/2015   Low back pain 10/25/2015   Herniated nucleus pulposus, C5-6 right 10/06/2015   Dysesthesia 09/16/2015   Spasticity 09/16/2015   Unsteady gait 09/16/2015   Multiple sclerosis (HCC) 06/08/2011     REFERRING DIAG: R26.81 (ICD-10-CM) - Unsteadiness on feet   THERAPY DIAG:  Unsteadiness on feet  Difficulty in walking, not elsewhere classified  Abnormality of gait and mobility  Muscle weakness (generalized)  Rationale for Evaluation and Treatment Rehabilitation  PERTINENT HISTORY: Patient presents to physical therapy for unsteadiness, walking instability, fatigue. His PMH includes paroxysmal a fib, MS, lymphedema, hypothyroidism, Graves Disease, arthritis, lumbar herniated disc (L5), depression and neuropathy. Patient first developed symptoms of MS in 2005 and was diagnosed in 2006. Patient has done PT in the past, but hasn't been seen since 2020 at [facility]. Has not been doing his exercises in the past year. Walk outside to garage to ride lawnmower, walks in house. Retired Optician, dispensing. Has a rollator at home but doesn't use it in the house   PRECAUTIONS: Fall  SUBJECTIVE: Patient reports no new issues- still complaining of more left LE weakness with increased  instances of left knee hyperextending.    Are you having pain? no   TODAY'S TREATMENT: 11/13/2022  THEREX:   Supine hip add with ball squeeze x x 10 reps Supine bridging with PT holding ankles- 10 reps with 3 sec hold Supine Hip ER with GTB x 15 reps each LE (hooklye)  Supine leg lifts up/over yoga block x 10 reps each LE Sidelye Hip abd x 15 reps each LE with slow eccentric control Sidelye clamshell x 15 reps each LE  Sidelye donkey kick x 15 reps each LE      PATIENT EDUCATION: Education details: exercise technique, body mechanics Person educated:  Patient Education method: Medical illustrator, verbal cues Education comprehension: verbalized understanding, returned demonstration    HOME EXERCISE PROGRAM: No changes this session   PT Short Term Goals -       PT SHORT TERM GOAL #1   Title Patient will be independent in home exercise program to improve strength/mobility for better functional independence with ADLs.    Baseline 8/9: HEP to be given next session, 10/31: patient reports compliance with HEP and would like progressions added next session. 08/22/2021=Patient verbalized that he is walking and doing his LE exercises with no questions at this time.    Time 4    Period Weeks    Status Achieved    Target Date 08/16/21              PT Long Term Goals       PT LONG TERM GOAL #1   Title Patient will increase FOTO score to equal to or greater than 70%  to demonstrate statistically significant improvement in mobility and quality of life.    Baseline 8/9: 64%; risk adjusted 34%; 03/21/21 FOTO: 45; 04/24/21 FOTO: 49% ; 08/22/2021= 56% 4/20: 60% 11/28/21: 63%; 01/04/2022= 60%. 03/22/2022= 73%; 10/23/22: 68   Time 12    Period Weeks    Status Achieved   Target Date 06/26/2022     PT LONG TERM GOAL #2   Title Patient will increase Berg Balance score by > 6 points to demonstrate decreased fall risk during functional activities.    Baseline 4/20: 43/56 6/6: 48/56; 713/2023= 50/56   Time 12    Period Weeks    Status Achieved   Target Date 03/29/2022     PT LONG TERM GOAL #3   Title Patient will increase 10 meter walk test to >1.68m/s as to improve gait speed for better community ambulation and to reduce fall risk.    Baseline 8/9: 0.65 m/s with rollator; 10/31: 0.68 m/s with rollator. 07/06/2021= 0.71 m/s using rollator; 08/22/2021= 0.94 m/s using rollator. 10/12/21: 1.1 m/s with rollator; 10/23/22: 0.28m/s    Time 12    Period Weeks    Status Achieved; maintaining gains    Target Date 10/11/21      PT LONG TERM GOAL #4    Title Patient will increase six minute walk test distance to >1000 for progression to community ambulator and improve gait ability    Baseline 8/9: 505 ft with rollator; 03/21/21: 729ft c rollator, 04/24/21: 763ft c rollator; 07/06/2021=900 feet; 08/22/2021= 935 feet with use of Rollator 44/20: 940 ft 11/28/21: 1055 feet with rollator   Time 12    Period Weeks    Status Achieved    Target Date 01/04/22      PT LONG TERM GOAL #5   Title Patient will increase glute medius strength on Left LE from able to lift off 3" (  sidelye on mat) to > 5" to improve stability, gait mechanics, and functional strength.    Baseline 10/31: 3-/5; 07/06/2021= 3-/5; 08/22/2021=3-/5 unable to achieve full ROM yet able to withstand some resistance with testing 4/20: unable to obtainfull ROM against gravity 6/6: unable to move against gravity; 01/04/2022= Continues to have difficulty - unable to raise left LE in sidelye against gravity but able to perform supine left Hip abd with some resistance= 3-/5; 03/22/2022- Patient able to raise his left LE 3 in off mat today. 04/03/2022= Patient able to raise left LE 3" off mat in sidelye today; 05/01/2022= 05/01/2022= patient able to raise left LE from sidelye position= 5"; 06/12/2022- Patient presents with 6.25 in sidelye Left hip abd against gravity. 06/26/2022= 7in in sidelye position.    Time 12    Period Weeks    Status MET   Target Date 06/26/2022     PT LONG TERM GOAL #6   Title Patient will increase Left single leg stance time to 15 seconds or greater to increase safety in shower and independence with ADLs.    Baseline 10/31; 1.83 sec without UE support; 08/22/2021=2-3  sec  on left; 6 sec on right 4/20: RLE 10 seconds LLE unable to perform 6/6: R LE 4 seconds during BERG; 01/04/2022= left = 3 sec and right = 8 sec; 02/13/2022= 20 sec on right at best and 4 sec on left LE at best. 03/23/2022=Patient able to stand on left LE at best for 7 sec today. 04/03/2022= 27 sec on right and 12 sec on  left; 06/12/2022= 13 sec on left  and 33 sec at best on right LE; 06/26/2021= 11 sec on left at best and 22 sec on right today; 07/26/2022= 10 sec on left and 20 sec on right today.; 09/04/22: 1-2 seconds on LLE. 27 sec on RLE.    Time 12    Period Weeks    Status On-going    Target Date 09/18/2022     PT LONG TERM GOAL #7   Title Patient will safely negotiate 13 steps with handrails to safely enter/exit mothers house.    Baseline 4/20: very challenging 6/6: able to copmlete safely even managing dogs per pt report; 01/04/2022- Patient performed 16 steps with B Rail-no difficulty other than fatigue.    Time 12    Period Weeks    Status Achieved   Target Date 01/04/22    PT LONG TERM GOAL #8  Title Patient will increase six minute walk test distance to >1100 ft.  for progression to community ambulator and improve gait ability   Baseline 04/03/2022= 920 feet with 4WW - (Added another goal for endurance to continue to progress community amb distances); 05/03/2022= 960 feet with upright 4WW (patient's first time using device): 06/12/2022= 990 feet with 4WW; 06/26/2022= 1010 feet with 4WW; 07/26/2022= 1015 feet; 09/04/22: 1,020'; 09/25/2022= Will reassess next visit. 10/04/2022= 1048ft;  Time 12   Period Weeks   Status Progressing  Target Date 12/18/2022       PT LONG TERM GOAL #9  Title Patient will be able to perform functional activities such as vacuuming or using backpack sprayer with modified independence for improved household chore abilities   Baseline 06/26/2022= Patient unable to perform either task but has strong desire to return to performing if able. 07/26/2022- Patient has not attempted to vacuum yet and PT session focusing on LE strengthening.; 09/04/22: Able to perform vacuuming tasks mod-I. Has to break up tasks due to limited standing tolerance.  Has not done outdoor tasks due to weather.  09/25/2022= Patient reports he was successful in vacuuming a few weeks ago- tiring but able to perform; has not  tried spraying or weedeating yet; 4/30: commenced activity simulation in session today. Still unable to perform spraying or edging at home as desired.   Time 12   Period Weeks   Status Ongoing  Target Date 12/18/2022   PT LONG TERM GOAL #10  Title Patient will increase glute medius strength on Left LE from able to lift off 7" (sidelye on mat) to > 10" to improve Hip stability with standing, gait mechanics, and functional strength.   Baseline 06/26/2022= 7in in sidelye position. 07/26/2022- 7.5 in; 09/04/22: 8"; 09/25/2022: deferred; 4/30: deferred   Time 12   Period Weeks   Status ONGOING  Target Date 12/18/2022     Plan - 12/13/21 1340     Clinical Impression Statement Patient presented today with good motivation and responded well to LE strengthening in supine and sidelye today- abel to raise his left LE up and over yoga block with VC for slow eccentric control. He did exhibit some compensation strategies with lifting Left LE but overall able to complete reps with VC and increased time. Pt will continue to benefit from skilled PT services to address remaining deficits to maximize his functional independence with mobility and decrease fall risk.    Personal Factors and Comorbidities Age;Comorbidity 3+;Fitness;Past/Current Experience;Time since onset of injury/illness/exacerbation    Comorbidities aroxysmal a fib, MS, lymphedema, hypothyroidism, Graves Disease, arthritis, lumbar herniated disc (L5), depression and neuropathy    Examination-Activity Limitations Bed Mobility;Bend;Caring for Others;Carry;Reach Overhead;Locomotion Level;Lift;Hygiene/Grooming;Dressing;Squat;Stairs;Stand;Toileting;Transfers    Examination-Participation Restrictions Cleaning;Community Activity;Laundry;Occupation;Shop;Volunteer;Yard Work    Conservation officer, historic buildings Evolving/Moderate complexity    Rehab Potential Fair    PT Frequency 1-2x / week    PT Duration 12 weeks    PT Treatment/Interventions ADLs/Self Care  Home Management;Aquatic Therapy;Canalith Repostioning;Biofeedback;Cryotherapy;Ultrasound;Traction;Moist Heat;Electrical Stimulation;DME Instruction;Gait training;Therapeutic exercise;Therapeutic activities;Functional mobility training;Stair training;Balance training;Neuromuscular re-education;Patient/family education;Manual techniques;Orthotic Fit/Training;Compression bandaging;Passive range of motion;Vestibular;Taping;Splinting;Energy conservation;Dry needling;Visual/perceptual remediation/compensation    PT Next Visit Plan Continue with progessive balance training, Progressive LE strenthening as appropriate.    PT Home Exercise Plan Access Code: [email or web address] URL: [email or web address]  Added Bridges with BTB and encouraged more walking around the home during commerical breaks when watching TV.    Consulted and Agree with Plan of Care Patient            4:41 PM, 11/13/22  4:41 PM, 11/13/22 Louis Meckel, PT Physical Therapist - [facility] [place] [facility]  Outpatient Physical Therapy- Main Campus [phone]

## 2022-11-15 ENCOUNTER — Ambulatory Visit: Payer: Medicare PPO

## 2022-11-15 DIAGNOSIS — R278 Other lack of coordination: Secondary | ICD-10-CM | POA: Diagnosis not present

## 2022-11-15 DIAGNOSIS — M6281 Muscle weakness (generalized): Secondary | ICD-10-CM | POA: Diagnosis not present

## 2022-11-15 DIAGNOSIS — R2689 Other abnormalities of gait and mobility: Secondary | ICD-10-CM

## 2022-11-15 DIAGNOSIS — R262 Difficulty in walking, not elsewhere classified: Secondary | ICD-10-CM | POA: Diagnosis not present

## 2022-11-15 DIAGNOSIS — R269 Unspecified abnormalities of gait and mobility: Secondary | ICD-10-CM | POA: Diagnosis not present

## 2022-11-15 DIAGNOSIS — R2681 Unsteadiness on feet: Secondary | ICD-10-CM | POA: Diagnosis not present

## 2022-11-15 NOTE — Therapy (Signed)
OUTPATIENT PHYSICAL THERAPY TREATMENT     Patient Name: Dustin Gray MRN: [id number] DOB:[birth year], 67 y.o., male Today's Date: 11/15/2022  PCP: Blair Heys, MD REFERRING PROVIDER: Shirlean Schlein, MD   PT End of Session - 11/15/22 1436     Visit Number 136    Number of Visits 148    Date for PT Re-Evaluation 12/18/22    Authorization Type [employer] Auth 3/26-5/30/24- 15 visits    Authorization Time Period 4/2-6/25/24    Progress Note Due on Visit 140    PT Start Time 1433    PT Stop Time 1515    PT Time Calculation (min) 42 min    Equipment Utilized During Treatment Gait belt    Activity Tolerance Patient tolerated treatment well;No increased pain    Behavior During Therapy WFL for tasks assessed/performed              Past Medical History:  Diagnosis Date   Abscess    groin   Arthritis    lower spine   Erectile dysfunction    Low testosterone    Lumbar herniated disc    L5   Multiple sclerosis (HCC)    Staph aureus infection    Past Surgical History:  Procedure Laterality Date   CARDIOVERSION N/A 11/22/2021   Procedure: CARDIOVERSION;  Surgeon: Debbe Odea, MD;  Location: [facility];  Service: Cardiovascular;  Laterality: N/A;   COLONOSCOPY WITH PROPOFOL N/A 07/04/2016   Procedure: COLONOSCOPY WITH PROPOFOL;  Surgeon: Midge Minium, MD;  Location: [facility];  Service: Endoscopy;  Laterality: N/A;   FINGER SURGERY  1964   MASS EXCISION  1960   POLYPECTOMY  07/04/2016   Procedure: POLYPECTOMY;  Surgeon: Midge Minium, MD;  Location: [facility];  Service: Endoscopy;;   TONSILLECTOMY     Patient Active Problem List   Diagnosis Date Noted   Persistent atrial fibrillation (HCC)    Graves disease    Abdominal bloating    Atrial fibrillation with RVR (HCC) 10/01/2019   Hyperthyroidism    Atrial fibrillation with rapid ventricular response (HCC) 09/30/2019   Leg edema    Left leg cellulitis    Special screening for  malignant neoplasms, colon    Polyp of sigmoid colon    Benign neoplasm of ascending colon    Rectal polyp    Herniated nucleus pulposus, L5-S1 11/09/2015   Low back pain 10/25/2015   Herniated nucleus pulposus, C5-6 right 10/06/2015   Dysesthesia 09/16/2015   Spasticity 09/16/2015   Unsteady gait 09/16/2015   Multiple sclerosis (HCC) 06/08/2011     REFERRING DIAG: R26.81 (ICD-10-CM) - Unsteadiness on feet   THERAPY DIAG:  Unsteadiness on feet  Difficulty in walking, not elsewhere classified  Abnormality of gait and mobility  Muscle weakness (generalized)  Rationale for Evaluation and Treatment Rehabilitation  PERTINENT HISTORY: Patient presents to physical therapy for unsteadiness, walking instability, fatigue. His PMH includes paroxysmal a fib, MS, lymphedema, hypothyroidism, Graves Disease, arthritis, lumbar herniated disc (L5), depression and neuropathy. Patient first developed symptoms of MS in 2005 and was diagnosed in 2006. Patient has done PT in the past, but hasn't been seen since 2020 at [facility]. Has not been doing his exercises in the past year. Walk outside to garage to ride lawnmower, walks in house. Retired Optician, dispensing. Has a rollator at home but doesn't use it in the house   PRECAUTIONS: Fall  SUBJECTIVE: Patient reports having a good day so far without any complaints today. He states  he was sore after last visit.   Are you having pain? no   TODAY'S TREATMENT: 11/15/2022  THEREX:   Combination of endurance walking with resistive ankle weights + stopping to performing some form of seated/standing LE strengthening   - 2 min walk using rollator and 5# AW -LAQ - 2 sets of 10 reps each -2 min walk -Sit to stand x 10 reps without UE support -2 min walk -Sit to stand x 10 reps without UE support - walk -Seated hip march- 10 reps each 5# AW -3 min walk -Sit to stand x 10 reps  -3 min walk - Standing hip ext 5#AW x 10 reps  each      PATIENT EDUCATION: Education details: exercise technique, body mechanics Person educated: Patient Education method: Medical illustrator, verbal cues Education comprehension: verbalized understanding, returned demonstration    HOME EXERCISE PROGRAM: No changes this session   PT Short Term Goals -       PT SHORT TERM GOAL #1   Title Patient will be independent in home exercise program to improve strength/mobility for better functional independence with ADLs.    Baseline 8/9: HEP to be given next session, 10/31: patient reports compliance with HEP and would like progressions added next session. 08/22/2021=Patient verbalized that he is walking and doing his LE exercises with no questions at this time.    Time 4    Period Weeks    Status Achieved    Target Date 08/16/21              PT Long Term Goals       PT LONG TERM GOAL #1   Title Patient will increase FOTO score to equal to or greater than 70%  to demonstrate statistically significant improvement in mobility and quality of life.    Baseline 8/9: 64%; risk adjusted 34%; 03/21/21 FOTO: 45; 04/24/21 FOTO: 49% ; 08/22/2021= 56% 4/20: 60% 11/28/21: 63%; 01/04/2022= 60%. 03/22/2022= 73%; 10/23/22: 68   Time 12    Period Weeks    Status Achieved   Target Date 06/26/2022     PT LONG TERM GOAL #2   Title Patient will increase Berg Balance score by > 6 points to demonstrate decreased fall risk during functional activities.    Baseline 4/20: 43/56 6/6: 48/56; 713/2023= 50/56   Time 12    Period Weeks    Status Achieved   Target Date 03/29/2022     PT LONG TERM GOAL #3   Title Patient will increase 10 meter walk test to >1.58m/s as to improve gait speed for better community ambulation and to reduce fall risk.    Baseline 8/9: 0.65 m/s with rollator; 10/31: 0.68 m/s with rollator. 07/06/2021= 0.71 m/s using rollator; 08/22/2021= 0.94 m/s using rollator. 10/12/21: 1.1 m/s with rollator; 10/23/22: 0.60m/s    Time 12     Period Weeks    Status Achieved; maintaining gains    Target Date 10/11/21      PT LONG TERM GOAL #4   Title Patient will increase six minute walk test distance to >1000 for progression to community ambulator and improve gait ability    Baseline 8/9: 505 ft with rollator; 03/21/21: 725ft c rollator, 04/24/21: 715ft c rollator; 07/06/2021=900 feet; 08/22/2021= 935 feet with use of Rollator 44/20: 940 ft 11/28/21: 1055 feet with rollator   Time 12    Period Weeks    Status Achieved    Target Date 01/04/22      PT LONG TERM  GOAL #5   Title Patient will increase glute medius strength on Left LE from able to lift off 3" (sidelye on mat) to > 5" to improve stability, gait mechanics, and functional strength.    Baseline 10/31: 3-/5; 07/06/2021= 3-/5; 08/22/2021=3-/5 unable to achieve full ROM yet able to withstand some resistance with testing 4/20: unable to obtainfull ROM against gravity 6/6: unable to move against gravity; 01/04/2022= Continues to have difficulty - unable to raise left LE in sidelye against gravity but able to perform supine left Hip abd with some resistance= 3-/5; 03/22/2022- Patient able to raise his left LE 3 in off mat today. 04/03/2022= Patient able to raise left LE 3" off mat in sidelye today; 05/01/2022= 05/01/2022= patient able to raise left LE from sidelye position= 5"; 06/12/2022- Patient presents with 6.25 in sidelye Left hip abd against gravity. 06/26/2022= 7in in sidelye position.    Time 12    Period Weeks    Status MET   Target Date 06/26/2022     PT LONG TERM GOAL #6   Title Patient will increase Left single leg stance time to 15 seconds or greater to increase safety in shower and independence with ADLs.    Baseline 10/31; 1.83 sec without UE support; 08/22/2021=2-3  sec  on left; 6 sec on right 4/20: RLE 10 seconds LLE unable to perform 6/6: R LE 4 seconds during BERG; 01/04/2022= left = 3 sec and right = 8 sec; 02/13/2022= 20 sec on right at best and 4 sec on left LE at best.  03/23/2022=Patient able to stand on left LE at best for 7 sec today. 04/03/2022= 27 sec on right and 12 sec on left; 06/12/2022= 13 sec on left  and 33 sec at best on right LE; 06/26/2021= 11 sec on left at best and 22 sec on right today; 07/26/2022= 10 sec on left and 20 sec on right today.; 09/04/22: 1-2 seconds on LLE. 27 sec on RLE.    Time 12    Period Weeks    Status On-going    Target Date 09/18/2022     PT LONG TERM GOAL #7   Title Patient will safely negotiate 13 steps with handrails to safely enter/exit mothers house.    Baseline 4/20: very challenging 6/6: able to copmlete safely even managing dogs per pt report; 01/04/2022- Patient performed 16 steps with B Rail-no difficulty other than fatigue.    Time 12    Period Weeks    Status Achieved   Target Date 01/04/22    PT LONG TERM GOAL #8  Title Patient will increase six minute walk test distance to >1100 ft.  for progression to community ambulator and improve gait ability   Baseline 04/03/2022= 920 feet with 4WW - (Added another goal for endurance to continue to progress community amb distances); 05/03/2022= 960 feet with upright 4WW (patient's first time using device): 06/12/2022= 990 feet with 4WW; 06/26/2022= 1010 feet with 4WW; 07/26/2022= 1015 feet; 09/04/22: 1,020'; 09/25/2022= Will reassess next visit. 10/04/2022= 1023ft;  Time 12   Period Weeks   Status Progressing  Target Date 12/18/2022       PT LONG TERM GOAL #9  Title Patient will be able to perform functional activities such as vacuuming or using backpack sprayer with modified independence for improved household chore abilities   Baseline 06/26/2022= Patient unable to perform either task but has strong desire to return to performing if able. 07/26/2022- Patient has not attempted to vacuum yet and PT session focusing  on LE strengthening.; 09/04/22: Able to perform vacuuming tasks mod-I. Has to break up tasks due to limited standing tolerance. Has not done outdoor tasks due to weather.   09/25/2022= Patient reports he was successful in vacuuming a few weeks ago- tiring but able to perform; has not tried spraying or weedeating yet; 4/30: commenced activity simulation in session today. Still unable to perform spraying or edging at home as desired.   Time 12   Period Weeks   Status Ongoing  Target Date 12/18/2022   PT LONG TERM GOAL #10  Title Patient will increase glute medius strength on Left LE from able to lift off 7" (sidelye on mat) to > 10" to improve Hip stability with standing, gait mechanics, and functional strength.   Baseline 06/26/2022= 7in in sidelye position. 07/26/2022- 7.5 in; 09/04/22: 8"; 09/25/2022: deferred; 4/30: deferred   Time 12   Period Weeks   Status ONGOING  Target Date 12/18/2022     Plan - 12/13/21 1340     Clinical Impression Statement Patient performed well overall with more functional endurance based training. He did drag his left LE more at times especially towards end of session. He was able to focus to compensate for weakness and still able to swing left through without tripping over left LE.  Pt will continue to benefit from skilled PT services to address remaining deficits to maximize his functional independence with mobility and decrease fall risk.    Personal Factors and Comorbidities Age;Comorbidity 3+;Fitness;Past/Current Experience;Time since onset of injury/illness/exacerbation    Comorbidities aroxysmal a fib, MS, lymphedema, hypothyroidism, Graves Disease, arthritis, lumbar herniated disc (L5), depression and neuropathy    Examination-Activity Limitations Bed Mobility;Bend;Caring for Others;Carry;Reach Overhead;Locomotion Level;Lift;Hygiene/Grooming;Dressing;Squat;Stairs;Stand;Toileting;Transfers    Examination-Participation Restrictions Cleaning;Community Activity;Laundry;Occupation;Shop;Volunteer;Yard Work    Conservation officer, historic buildings Evolving/Moderate complexity    Rehab Potential Fair    PT Frequency 1-2x / week    PT Duration 12  weeks    PT Treatment/Interventions ADLs/Self Care Home Management;Aquatic Therapy;Canalith Repostioning;Biofeedback;Cryotherapy;Ultrasound;Traction;Moist Heat;Electrical Stimulation;DME Instruction;Gait training;Therapeutic exercise;Therapeutic activities;Functional mobility training;Stair training;Balance training;Neuromuscular re-education;Patient/family education;Manual techniques;Orthotic Fit/Training;Compression bandaging;Passive range of motion;Vestibular;Taping;Splinting;Energy conservation;Dry needling;Visual/perceptual remediation/compensation    PT Next Visit Plan Continue with progessive balance training, Progressive LE strenthening as appropriate.    PT Home Exercise Plan Access Code: [email or web address] URL: [email or web address]  Added Bridges with BTB and encouraged more walking around the home during commerical breaks when watching TV.    Consulted and Agree with Plan of Care Patient            4:20 PM, 11/15/22  4:20 PM, 11/15/22 Louis Meckel, PT Physical Therapist - [facility]  Outpatient Physical Therapy- Main Campus [phone]

## 2022-11-20 ENCOUNTER — Ambulatory Visit: Payer: Medicare PPO

## 2022-11-20 DIAGNOSIS — R262 Difficulty in walking, not elsewhere classified: Secondary | ICD-10-CM

## 2022-11-20 DIAGNOSIS — R269 Unspecified abnormalities of gait and mobility: Secondary | ICD-10-CM

## 2022-11-20 DIAGNOSIS — M6281 Muscle weakness (generalized): Secondary | ICD-10-CM | POA: Diagnosis not present

## 2022-11-20 DIAGNOSIS — R278 Other lack of coordination: Secondary | ICD-10-CM | POA: Diagnosis not present

## 2022-11-20 DIAGNOSIS — R2681 Unsteadiness on feet: Secondary | ICD-10-CM | POA: Diagnosis not present

## 2022-11-20 DIAGNOSIS — R2689 Other abnormalities of gait and mobility: Secondary | ICD-10-CM

## 2022-11-20 DIAGNOSIS — E0500 Thyrotoxicosis with diffuse goiter without thyrotoxic crisis or storm: Secondary | ICD-10-CM | POA: Diagnosis not present

## 2022-11-20 NOTE — Therapy (Signed)
OUTPATIENT PHYSICAL THERAPY TREATMENT     Patient Name: Dustin Gray MRN: [id number] DOB:04-28-1956, 67 y.o., male Today's Date: 11/21/2022  PCP: Blair Heys, MD REFERRING PROVIDER: Shirlean Schlein, MD   PT End of Session - 11/20/22 1437     Visit Number 137    Number of Visits 148    Date for PT Re-Evaluation 12/18/22    Authorization Type [employer] Auth 3/26-5/30/24- 15 visits    Authorization Time Period 4/2-6/25/24    Progress Note Due on Visit 140    PT Start Time 1434    PT Stop Time 1514    PT Time Calculation (min) 40 min    Equipment Utilized During Treatment Gait belt    Activity Tolerance Patient tolerated treatment well;No increased pain    Behavior During Therapy WFL for tasks assessed/performed              Past Medical History:  Diagnosis Date   Abscess    groin   Arthritis    lower spine   Erectile dysfunction    Low testosterone    Lumbar herniated disc    L5   Multiple sclerosis (HCC)    Staph aureus infection    Past Surgical History:  Procedure Laterality Date   CARDIOVERSION N/A 11/22/2021   Procedure: CARDIOVERSION;  Surgeon: Debbe Odea, MD;  Location: [facility];  Service: Cardiovascular;  Laterality: N/A;   COLONOSCOPY WITH PROPOFOL N/A 07/04/2016   Procedure: COLONOSCOPY WITH PROPOFOL;  Surgeon: Midge Minium, MD;  Location: [facility];  Service: Endoscopy;  Laterality: N/A;   FINGER SURGERY  1964   MASS EXCISION  1960   POLYPECTOMY  07/04/2016   Procedure: POLYPECTOMY;  Surgeon: Midge Minium, MD;  Location: [facility];  Service: Endoscopy;;   TONSILLECTOMY     Patient Active Problem List   Diagnosis Date Noted   Persistent atrial fibrillation (HCC)    Graves disease    Abdominal bloating    Atrial fibrillation with RVR (HCC) 10/01/2019   Hyperthyroidism    Atrial fibrillation with rapid ventricular response (HCC) 09/30/2019   Leg edema    Left leg cellulitis    Special screening for  malignant neoplasms, colon    Polyp of sigmoid colon    Benign neoplasm of ascending colon    Rectal polyp    Herniated nucleus pulposus, L5-S1 11/09/2015   Low back pain 10/25/2015   Herniated nucleus pulposus, C5-6 right 10/06/2015   Dysesthesia 09/16/2015   Spasticity 09/16/2015   Unsteady gait 09/16/2015   Multiple sclerosis (HCC) 06/08/2011     REFERRING DIAG: R26.81 (ICD-10-CM) - Unsteadiness on feet   THERAPY DIAG:  Unsteadiness on feet  Difficulty in walking, not elsewhere classified  Abnormality of gait and mobility  Muscle weakness (generalized)  Rationale for Evaluation and Treatment Rehabilitation  PERTINENT HISTORY: Patient presents to physical therapy for unsteadiness, walking instability, fatigue. His PMH includes paroxysmal a fib, MS, lymphedema, hypothyroidism, Graves Disease, arthritis, lumbar herniated disc (L5), depression and neuropathy. Patient first developed symptoms of MS in 2005 and was diagnosed in 2006. Patient has done PT in the past, but hasn't been seen since 2020 at [facility]. Has not been doing his exercises in the past year. Walk outside to garage to ride lawnmower, walks in house. Retired Optician, dispensing. Has a rollator at home but doesn't use it in the house   PRECAUTIONS: Fall  SUBJECTIVE: Patient reports having a good day so far without any complaints today. He states  he was sore after last visit.   Are you having pain? no   TODAY'S TREATMENT: 11/15/2022  THEREX:   - in // bars: with 3lb. AW   Dynamic high knee marching- down then come back- retro lunge back x 3 overall.  -static stand on 1 LE while other dynamic hip circles CW/CCW around cone 2 setsx 10 reps each - Static stand on 1 LE while opp LE dynamic step up/over cone x 15 reps  Dynamic side step up/over 1/2 foam x 20 reps each LE Standing calf raises on 1/2 foam x 15 reps  Standing ham curl x 15 reps each LE Standing hip abd with RTB around b ankles- down and back x  3      PATIENT EDUCATION: Education details: exercise technique, body mechanics Person educated: Patient Education method: Medical illustrator, verbal cues Education comprehension: verbalized understanding, returned demonstration    HOME EXERCISE PROGRAM: No changes this session   PT Short Term Goals -       PT SHORT TERM GOAL #1   Title Patient will be independent in home exercise program to improve strength/mobility for better functional independence with ADLs.    Baseline 8/9: HEP to be given next session, 10/31: patient reports compliance with HEP and would like progressions added next session. 08/22/2021=Patient verbalized that he is walking and doing his LE exercises with no questions at this time.    Time 4    Period Weeks    Status Achieved    Target Date 08/16/21              PT Long Term Goals       PT LONG TERM GOAL #1   Title Patient will increase FOTO score to equal to or greater than 70%  to demonstrate statistically significant improvement in mobility and quality of life.    Baseline 8/9: 64%; risk adjusted 34%; 03/21/21 FOTO: 45; 04/24/21 FOTO: 49% ; 08/22/2021= 56% 4/20: 60% 11/28/21: 63%; 01/04/2022= 60%. 03/22/2022= 73%; 10/23/22: 68   Time 12    Period Weeks    Status Achieved   Target Date 06/26/2022     PT LONG TERM GOAL #2   Title Patient will increase Berg Balance score by > 6 points to demonstrate decreased fall risk during functional activities.    Baseline 4/20: 43/56 6/6: 48/56; 713/2023= 50/56   Time 12    Period Weeks    Status Achieved   Target Date 03/29/2022     PT LONG TERM GOAL #3   Title Patient will increase 10 meter walk test to >1.46m/s as to improve gait speed for better community ambulation and to reduce fall risk.    Baseline 8/9: 0.65 m/s with rollator; 10/31: 0.68 m/s with rollator. 07/06/2021= 0.71 m/s using rollator; 08/22/2021= 0.94 m/s using rollator. 10/12/21: 1.1 m/s with rollator; 10/23/22: 0.24m/s    Time 12     Period Weeks    Status Achieved; maintaining gains    Target Date 10/11/21      PT LONG TERM GOAL #4   Title Patient will increase six minute walk test distance to >1000 for progression to community ambulator and improve gait ability    Baseline 8/9: 505 ft with rollator; 03/21/21: 787ft c rollator, 04/24/21: 754ft c rollator; 07/06/2021=900 feet; 08/22/2021= 935 feet with use of Rollator 44/20: 940 ft 11/28/21: 1055 feet with rollator   Time 12    Period Weeks    Status Achieved    Target Date 01/04/22  PT LONG TERM GOAL #5   Title Patient will increase glute medius strength on Left LE from able to lift off 3" (sidelye on mat) to > 5" to improve stability, gait mechanics, and functional strength.    Baseline 10/31: 3-/5; 07/06/2021= 3-/5; 08/22/2021=3-/5 unable to achieve full ROM yet able to withstand some resistance with testing 4/20: unable to obtainfull ROM against gravity 6/6: unable to move against gravity; 01/04/2022= Continues to have difficulty - unable to raise left LE in sidelye against gravity but able to perform supine left Hip abd with some resistance= 3-/5; 03/22/2022- Patient able to raise his left LE 3 in off mat today. 04/03/2022= Patient able to raise left LE 3" off mat in sidelye today; 05/01/2022= 05/01/2022= patient able to raise left LE from sidelye position= 5"; 06/12/2022- Patient presents with 6.25 in sidelye Left hip abd against gravity. 06/26/2022= 7in in sidelye position.    Time 12    Period Weeks    Status MET   Target Date 06/26/2022     PT LONG TERM GOAL #6   Title Patient will increase Left single leg stance time to 15 seconds or greater to increase safety in shower and independence with ADLs.    Baseline 10/31; 1.83 sec without UE support; 08/22/2021=2-3  sec  on left; 6 sec on right 4/20: RLE 10 seconds LLE unable to perform 6/6: R LE 4 seconds during BERG; 01/04/2022= left = 3 sec and right = 8 sec; 02/13/2022= 20 sec on right at best and 4 sec on left LE at best.  03/23/2022=Patient able to stand on left LE at best for 7 sec today. 04/03/2022= 27 sec on right and 12 sec on left; 06/12/2022= 13 sec on left  and 33 sec at best on right LE; 06/26/2021= 11 sec on left at best and 22 sec on right today; 07/26/2022= 10 sec on left and 20 sec on right today.; 09/04/22: 1-2 seconds on LLE. 27 sec on RLE.    Time 12    Period Weeks    Status On-going    Target Date 09/18/2022     PT LONG TERM GOAL #7   Title Patient will safely negotiate 13 steps with handrails to safely enter/exit mothers house.    Baseline 4/20: very challenging 6/6: able to copmlete safely even managing dogs per pt report; 01/04/2022- Patient performed 16 steps with B Rail-no difficulty other than fatigue.    Time 12    Period Weeks    Status Achieved   Target Date 01/04/22    PT LONG TERM GOAL #8  Title Patient will increase six minute walk test distance to >1100 ft.  for progression to community ambulator and improve gait ability   Baseline 04/03/2022= 920 feet with 4WW - (Added another goal for endurance to continue to progress community amb distances); 05/03/2022= 960 feet with upright 4WW (patient's first time using device): 06/12/2022= 990 feet with 4WW; 06/26/2022= 1010 feet with 4WW; 07/26/2022= 1015 feet; 09/04/22: 1,020'; 09/25/2022= Will reassess next visit. 10/04/2022= 1079ft;  Time 12   Period Weeks   Status Progressing  Target Date 12/18/2022       PT LONG TERM GOAL #9  Title Patient will be able to perform functional activities such as vacuuming or using backpack sprayer with modified independence for improved household chore abilities   Baseline 06/26/2022= Patient unable to perform either task but has strong desire to return to performing if able. 07/26/2022- Patient has not attempted to vacuum yet and  PT session focusing on LE strengthening.; 09/04/22: Able to perform vacuuming tasks mod-I. Has to break up tasks due to limited standing tolerance. Has not done outdoor tasks due to weather.   09/25/2022= Patient reports he was successful in vacuuming a few weeks ago- tiring but able to perform; has not tried spraying or weedeating yet; 4/30: commenced activity simulation in session today. Still unable to perform spraying or edging at home as desired.   Time 12   Period Weeks   Status Ongoing  Target Date 12/18/2022   PT LONG TERM GOAL #10  Title Patient will increase glute medius strength on Left LE from able to lift off 7" (sidelye on mat) to > 10" to improve Hip stability with standing, gait mechanics, and functional strength.   Baseline 06/26/2022= 7in in sidelye position. 07/26/2022- 7.5 in; 09/04/22: 8"; 09/25/2022: deferred; 4/30: deferred   Time 12   Period Weeks   Status ONGOING  Target Date 12/18/2022     Plan - 12/13/21 1340     Clinical Impression Statement Treatment focused more on general LE strenghtening. Patient was able to complete all exercises well including left LE- improved height with ham curls and performed well with resistive side stepping- even to left side today. Pt will continue to benefit from skilled PT services to address remaining deficits to maximize his functional independence with mobility and decrease fall risk.    Personal Factors and Comorbidities Age;Comorbidity 3+;Fitness;Past/Current Experience;Time since onset of injury/illness/exacerbation    Comorbidities aroxysmal a fib, MS, lymphedema, hypothyroidism, Graves Disease, arthritis, lumbar herniated disc (L5), depression and neuropathy    Examination-Activity Limitations Bed Mobility;Bend;Caring for Others;Carry;Reach Overhead;Locomotion Level;Lift;Hygiene/Grooming;Dressing;Squat;Stairs;Stand;Toileting;Transfers    Examination-Participation Restrictions Cleaning;Community Activity;Laundry;Occupation;Shop;Volunteer;Yard Work    Conservation officer, historic buildings Evolving/Moderate complexity    Rehab Potential Fair    PT Frequency 1-2x / week    PT Duration 12 weeks    PT Treatment/Interventions  ADLs/Self Care Home Management;Aquatic Therapy;Canalith Repostioning;Biofeedback;Cryotherapy;Ultrasound;Traction;Moist Heat;Electrical Stimulation;DME Instruction;Gait training;Therapeutic exercise;Therapeutic activities;Functional mobility training;Stair training;Balance training;Neuromuscular re-education;Patient/family education;Manual techniques;Orthotic Fit/Training;Compression bandaging;Passive range of motion;Vestibular;Taping;Splinting;Energy conservation;Dry needling;Visual/perceptual remediation/compensation    PT Next Visit Plan Continue with progessive balance training, Progressive LE strenthening as appropriate.    PT Home Exercise Plan Access Code: [email or web address] URL: [email or web address]  Added Bridges with BTB and encouraged more walking around the home during commerical breaks when watching TV.    Consulted and Agree with Plan of Care Patient            11:58 AM, 11/21/22  11:58 AM, 11/21/22 Louis Meckel, PT Physical Therapist - [facility]  Outpatient Physical Therapy- Main Campus [phone]

## 2022-11-22 ENCOUNTER — Ambulatory Visit: Payer: Medicare PPO

## 2022-11-26 DIAGNOSIS — M5136 Other intervertebral disc degeneration, lumbar region: Secondary | ICD-10-CM | POA: Diagnosis not present

## 2022-11-26 DIAGNOSIS — Z79899 Other long term (current) drug therapy: Secondary | ICD-10-CM | POA: Diagnosis not present

## 2022-11-26 DIAGNOSIS — G35 Multiple sclerosis: Secondary | ICD-10-CM | POA: Diagnosis not present

## 2022-11-27 ENCOUNTER — Ambulatory Visit: Payer: Medicare PPO | Attending: Neurology

## 2022-11-27 DIAGNOSIS — R2689 Other abnormalities of gait and mobility: Secondary | ICD-10-CM | POA: Diagnosis not present

## 2022-11-27 DIAGNOSIS — R262 Difficulty in walking, not elsewhere classified: Secondary | ICD-10-CM | POA: Diagnosis not present

## 2022-11-27 DIAGNOSIS — M6281 Muscle weakness (generalized): Secondary | ICD-10-CM | POA: Diagnosis not present

## 2022-11-27 DIAGNOSIS — E0500 Thyrotoxicosis with diffuse goiter without thyrotoxic crisis or storm: Secondary | ICD-10-CM | POA: Diagnosis not present

## 2022-11-27 DIAGNOSIS — E042 Nontoxic multinodular goiter: Secondary | ICD-10-CM | POA: Diagnosis not present

## 2022-11-27 DIAGNOSIS — R269 Unspecified abnormalities of gait and mobility: Secondary | ICD-10-CM | POA: Diagnosis not present

## 2022-11-27 DIAGNOSIS — R2681 Unsteadiness on feet: Secondary | ICD-10-CM | POA: Diagnosis not present

## 2022-11-27 DIAGNOSIS — R278 Other lack of coordination: Secondary | ICD-10-CM | POA: Diagnosis not present

## 2022-11-27 NOTE — Therapy (Signed)
OUTPATIENT PHYSICAL THERAPY TREATMENT     Patient Name: Dustin Gray MRN: [id number] DOB:[birth year], 67 y.o., male Today's Date: 11/28/2022  PCP: Blair Heys, MD REFERRING PROVIDER: Shirlean Schlein, MD   PT End of Session - 11/27/22 1443     Visit Number 138    Number of Visits 148    Date for PT Re-Evaluation 12/18/22    Authorization Type [employer] Auth 3/26-5/30/24- 15 visits    Authorization Time Period 4/2-6/25/24    Progress Note Due on Visit 140    PT Start Time 1430    PT Stop Time 1514    PT Time Calculation (min) 44 min    Equipment Utilized During Treatment Gait belt    Activity Tolerance Patient tolerated treatment well;No increased pain    Behavior During Therapy WFL for tasks assessed/performed               Past Medical History:  Diagnosis Date   Abscess    groin   Arthritis    lower spine   Erectile dysfunction    Low testosterone    Lumbar herniated disc    L5   Multiple sclerosis (HCC)    Staph aureus infection    Past Surgical History:  Procedure Laterality Date   CARDIOVERSION N/A 11/22/2021   Procedure: CARDIOVERSION;  Surgeon: Debbe Odea, MD;  Location: [facility];  Service: Cardiovascular;  Laterality: N/A;   COLONOSCOPY WITH PROPOFOL N/A 07/04/2016   Procedure: COLONOSCOPY WITH PROPOFOL;  Surgeon: Midge Minium, MD;  Location: [facility];  Service: Endoscopy;  Laterality: N/A;   FINGER SURGERY  1964   MASS EXCISION  1960   POLYPECTOMY  07/04/2016   Procedure: POLYPECTOMY;  Surgeon: Midge Minium, MD;  Location: [facility];  Service: Endoscopy;;   TONSILLECTOMY     Patient Active Problem List   Diagnosis Date Noted   Persistent atrial fibrillation (HCC)    Graves disease    Abdominal bloating    Atrial fibrillation with RVR (HCC) 10/01/2019   Hyperthyroidism    Atrial fibrillation with rapid ventricular response (HCC) 09/30/2019   Leg edema    Left leg cellulitis    Special screening for  malignant neoplasms, colon    Polyp of sigmoid colon    Benign neoplasm of ascending colon    Rectal polyp    Herniated nucleus pulposus, L5-S1 11/09/2015   Low back pain 10/25/2015   Herniated nucleus pulposus, C5-6 right 10/06/2015   Dysesthesia 09/16/2015   Spasticity 09/16/2015   Unsteady gait 09/16/2015   Multiple sclerosis (HCC) 06/08/2011     REFERRING DIAG: R26.81 (ICD-10-CM) - Unsteadiness on feet   THERAPY DIAG:  Unsteadiness on feet  Difficulty in walking, not elsewhere classified  Abnormality of gait and mobility  Muscle weakness (generalized)  Rationale for Evaluation and Treatment Rehabilitation  PERTINENT HISTORY: Patient presents to physical therapy for unsteadiness, walking instability, fatigue. His PMH includes paroxysmal a fib, MS, lymphedema, hypothyroidism, Graves Disease, arthritis, lumbar herniated disc (L5), depression and neuropathy. Patient first developed symptoms of MS in 2005 and was diagnosed in 2006. Patient has done PT in the past, but hasn't been seen since 2020 at [facility]. Has not been doing his exercises in the past year. Walk outside to garage to ride lawnmower, walks in house. Retired Optician, dispensing. Has a rollator at home but doesn't use it in the house   PRECAUTIONS: Fall  SUBJECTIVE: Patient reports he was riding on his mower and it tipped over in  a ditch and he tumbled- able to get up with some help.   Are you having pain? no   TODAY'S TREATMENT: 11/27/2022  THEREX: with 5lb AW BLE  [address] 1:   - Seated hip march x 10 reps  -Standing hip march x 10 reps  - Ambulation x 175 feet with bariatric 4WW  [address]:   -Seated  LAQ  - Standing - Retro gait in // bars x 4 each way  -Ambulation x 175 feet with bariatric 4WW  Circuit 3:   -Sit to stand x 10 without UE support  -Standing/walking hip swings (around 1/2 spike balls x 4) - down and  Back x 5.  -Ambulation x 175 feet without UE Support Circuit 4:   -Seated hip  up/over 1/2 spike ball x 10 reps on right LE only  -Stand hip circles around blue cylinder (roll) x 5 CW and x 5 CCW  -Ambulation x 175 feet without UE support      PATIENT EDUCATION: Education details: exercise technique, body mechanics Person educated: Patient Education method: Medical illustrator, verbal cues Education comprehension: verbalized understanding, returned demonstration    HOME EXERCISE PROGRAM: No changes this session   PT Short Term Goals -       PT SHORT TERM GOAL #1   Title Patient will be independent in home exercise program to improve strength/mobility for better functional independence with ADLs.    Baseline 8/9: HEP to be given next session, 10/31: patient reports compliance with HEP and would like progressions added next session. 08/22/2021=Patient verbalized that he is walking and doing his LE exercises with no questions at this time.    Time 4    Period Weeks    Status Achieved    Target Date 08/16/21              PT Long Term Goals       PT LONG TERM GOAL #1   Title Patient will increase FOTO score to equal to or greater than 70%  to demonstrate statistically significant improvement in mobility and quality of life.    Baseline 8/9: 64%; risk adjusted 34%; 03/21/21 FOTO: 45; 04/24/21 FOTO: 49% ; 08/22/2021= 56% 4/20: 60% 11/28/21: 63%; 01/04/2022= 60%. 03/22/2022= 73%; 10/23/22: 68   Time 12    Period Weeks    Status Achieved   Target Date 06/26/2022     PT LONG TERM GOAL #2   Title Patient will increase Berg Balance score by > 6 points to demonstrate decreased fall risk during functional activities.    Baseline 4/20: 43/56 6/6: 48/56; 713/2023= 50/56   Time 12    Period Weeks    Status Achieved   Target Date 03/29/2022     PT LONG TERM GOAL #3   Title Patient will increase 10 meter walk test to >1.32m/s as to improve gait speed for better community ambulation and to reduce fall risk.    Baseline 8/9: 0.65 m/s with rollator; 10/31:  0.68 m/s with rollator. 07/06/2021= 0.71 m/s using rollator; 08/22/2021= 0.94 m/s using rollator. 10/12/21: 1.1 m/s with rollator; 10/23/22: 0.89m/s    Time 12    Period Weeks    Status Achieved; maintaining gains    Target Date 10/11/21      PT LONG TERM GOAL #4   Title Patient will increase six minute walk test distance to >1000 for progression to community ambulator and improve gait ability    Baseline 8/9: 505 ft with rollator; 03/21/21:  722ft c rollator, 04/24/21: 733ft c rollator; 07/06/2021=900 feet; 08/22/2021= 935 feet with use of Rollator 44/20: 940 ft 11/28/21: 1055 feet with rollator   Time 12    Period Weeks    Status Achieved    Target Date 01/04/22      PT LONG TERM GOAL #5   Title Patient will increase glute medius strength on Left LE from able to lift off 3" (sidelye on mat) to > 5" to improve stability, gait mechanics, and functional strength.    Baseline 10/31: 3-/5; 07/06/2021= 3-/5; 08/22/2021=3-/5 unable to achieve full ROM yet able to withstand some resistance with testing 4/20: unable to obtainfull ROM against gravity 6/6: unable to move against gravity; 01/04/2022= Continues to have difficulty - unable to raise left LE in sidelye against gravity but able to perform supine left Hip abd with some resistance= 3-/5; 03/22/2022- Patient able to raise his left LE 3 in off mat today. 04/03/2022= Patient able to raise left LE 3" off mat in sidelye today; 05/01/2022= 05/01/2022= patient able to raise left LE from sidelye position= 5"; 06/12/2022- Patient presents with 6.25 in sidelye Left hip abd against gravity. 06/26/2022= 7in in sidelye position.    Time 12    Period Weeks    Status MET   Target Date 06/26/2022     PT LONG TERM GOAL #6   Title Patient will increase Left single leg stance time to 15 seconds or greater to increase safety in shower and independence with ADLs.    Baseline 10/31; 1.83 sec without UE support; 08/22/2021=2-3  sec  on left; 6 sec on right 4/20: RLE 10 seconds LLE  unable to perform 6/6: R LE 4 seconds during BERG; 01/04/2022= left = 3 sec and right = 8 sec; 02/13/2022= 20 sec on right at best and 4 sec on left LE at best. 03/23/2022=Patient able to stand on left LE at best for 7 sec today. 04/03/2022= 27 sec on right and 12 sec on left; 06/12/2022= 13 sec on left  and 33 sec at best on right LE; 06/26/2021= 11 sec on left at best and 22 sec on right today; 07/26/2022= 10 sec on left and 20 sec on right today.; 09/04/22: 1-2 seconds on LLE. 27 sec on RLE.    Time 12    Period Weeks    Status On-going    Target Date 09/18/2022     PT LONG TERM GOAL #7   Title Patient will safely negotiate 13 steps with handrails to safely enter/exit mothers house.    Baseline 4/20: very challenging 6/6: able to copmlete safely even managing dogs per pt report; 01/04/2022- Patient performed 16 steps with B Rail-no difficulty other than fatigue.    Time 12    Period Weeks    Status Achieved   Target Date 01/04/22    PT LONG TERM GOAL #8  Title Patient will increase six minute walk test distance to >1100 ft.  for progression to community ambulator and improve gait ability   Baseline 04/03/2022= 920 feet with 4WW - (Added another goal for endurance to continue to progress community amb distances); 05/03/2022= 960 feet with upright 4WW (patient's first time using device): 06/12/2022= 990 feet with 4WW; 06/26/2022= 1010 feet with 4WW; 07/26/2022= 1015 feet; 09/04/22: 1,020'; 09/25/2022= Will reassess next visit. 10/04/2022= 1011ft;  Time 12   Period Weeks   Status Progressing  Target Date 12/18/2022       PT LONG TERM GOAL #9  Title Patient will be  able to perform functional activities such as vacuuming or using backpack sprayer with modified independence for improved household chore abilities   Baseline 06/26/2022= Patient unable to perform either task but has strong desire to return to performing if able. 07/26/2022- Patient has not attempted to vacuum yet and PT session focusing on LE  strengthening.; 09/04/22: Able to perform vacuuming tasks mod-I. Has to break up tasks due to limited standing tolerance. Has not done outdoor tasks due to weather.  09/25/2022= Patient reports he was successful in vacuuming a few weeks ago- tiring but able to perform; has not tried spraying or weedeating yet; 4/30: commenced activity simulation in session today. Still unable to perform spraying or edging at home as desired.   Time 12   Period Weeks   Status Ongoing  Target Date 12/18/2022   PT LONG TERM GOAL #10  Title Patient will increase glute medius strength on Left LE from able to lift off 7" (sidelye on mat) to > 10" to improve Hip stability with standing, gait mechanics, and functional strength.   Baseline 06/26/2022= 7in in sidelye position. 07/26/2022- 7.5 in; 09/04/22: 8"; 09/25/2022: deferred; 4/30: deferred   Time 12   Period Weeks   Status ONGOING  Target Date 12/18/2022     Plan - 12/13/21 1340     Clinical Impression Statement Patient presents with excellent motivation and worked hard performing all activities with 5 lb AW on BLE. He continues to present with increased fatigue overall with left side and most difficulty with more proximal weakness. He continues to lift Left LE well with resistive walking.  Pt will continue to benefit from skilled PT services to address remaining deficits to maximize his functional independence with mobility and decrease fall risk.    Personal Factors and Comorbidities Age;Comorbidity 3+;Fitness;Past/Current Experience;Time since onset of injury/illness/exacerbation    Comorbidities aroxysmal a fib, MS, lymphedema, hypothyroidism, Graves Disease, arthritis, lumbar herniated disc (L5), depression and neuropathy    Examination-Activity Limitations Bed Mobility;Bend;Caring for Others;Carry;Reach Overhead;Locomotion Level;Lift;Hygiene/Grooming;Dressing;Squat;Stairs;Stand;Toileting;Transfers    Examination-Participation Restrictions Cleaning;Community  Activity;Laundry;Occupation;Shop;Volunteer;Yard Work    Conservation officer, historic buildings Evolving/Moderate complexity    Rehab Potential Fair    PT Frequency 1-2x / week    PT Duration 12 weeks    PT Treatment/Interventions ADLs/Self Care Home Management;Aquatic Therapy;Canalith Repostioning;Biofeedback;Cryotherapy;Ultrasound;Traction;Moist Heat;Electrical Stimulation;DME Instruction;Gait training;Therapeutic exercise;Therapeutic activities;Functional mobility training;Stair training;Balance training;Neuromuscular re-education;Patient/family education;Manual techniques;Orthotic Fit/Training;Compression bandaging;Passive range of motion;Vestibular;Taping;Splinting;Energy conservation;Dry needling;Visual/perceptual remediation/compensation    PT Next Visit Plan Continue with progessive balance training, Progressive LE strenthening as appropriate.    PT Home Exercise Plan Access Code: [email or web address] URL: [email or web address]  Added Bridges with BTB and encouraged more walking around the home during commerical breaks when watching TV.    Consulted and Agree with Plan of Care Patient            1:46 PM, 11/28/22  1:46 PM, 11/28/22 Louis Meckel, PT Physical Therapist - [facility]  Outpatient Physical Therapy- Main Campus [phone]

## 2022-11-29 ENCOUNTER — Ambulatory Visit: Payer: Medicare PPO

## 2022-11-29 DIAGNOSIS — R2681 Unsteadiness on feet: Secondary | ICD-10-CM

## 2022-11-29 DIAGNOSIS — R2689 Other abnormalities of gait and mobility: Secondary | ICD-10-CM | POA: Diagnosis not present

## 2022-11-29 DIAGNOSIS — R262 Difficulty in walking, not elsewhere classified: Secondary | ICD-10-CM | POA: Diagnosis not present

## 2022-11-29 DIAGNOSIS — M6281 Muscle weakness (generalized): Secondary | ICD-10-CM

## 2022-11-29 DIAGNOSIS — R278 Other lack of coordination: Secondary | ICD-10-CM | POA: Diagnosis not present

## 2022-11-29 DIAGNOSIS — R269 Unspecified abnormalities of gait and mobility: Secondary | ICD-10-CM | POA: Diagnosis not present

## 2022-11-29 NOTE — Therapy (Signed)
OUTPATIENT PHYSICAL THERAPY TREATMENT     Patient Name: Dustin Gray MRN: [id number] DOB:[birth year], 67 y.o., male Today's Date: 11/29/2022  PCP: Blair Heys, MD REFERRING PROVIDER: Shirlean Schlein, MD   PT End of Session - 11/29/22 1455     Visit Number 139    Number of Visits 148    Date for PT Re-Evaluation 12/18/22    Authorization Type [employer] Auth 3/26-5/30/24- 15 visits    Authorization Time Period 4/2-6/25/24    Progress Note Due on Visit 140    PT Start Time 1432    PT Stop Time 1508    PT Time Calculation (min) 36 min    Equipment Utilized During Treatment Gait belt    Activity Tolerance Patient tolerated treatment well;No increased pain    Behavior During Therapy WFL for tasks assessed/performed               Past Medical History:  Diagnosis Date   Abscess    groin   Arthritis    lower spine   Erectile dysfunction    Low testosterone    Lumbar herniated disc    L5   Multiple sclerosis (HCC)    Staph aureus infection    Past Surgical History:  Procedure Laterality Date   CARDIOVERSION N/A 11/22/2021   Procedure: CARDIOVERSION;  Surgeon: Debbe Odea, MD;  Location: [facility];  Service: Cardiovascular;  Laterality: N/A;   COLONOSCOPY WITH PROPOFOL N/A 07/04/2016   Procedure: COLONOSCOPY WITH PROPOFOL;  Surgeon: Midge Minium, MD;  Location: [facility];  Service: Endoscopy;  Laterality: N/A;   FINGER SURGERY  1964   MASS EXCISION  1960   POLYPECTOMY  07/04/2016   Procedure: POLYPECTOMY;  Surgeon: Midge Minium, MD;  Location: [facility];  Service: Endoscopy;;   TONSILLECTOMY     Patient Active Problem List   Diagnosis Date Noted   Persistent atrial fibrillation (HCC)    Graves disease    Abdominal bloating    Atrial fibrillation with RVR (HCC) 10/01/2019   Hyperthyroidism    Atrial fibrillation with rapid ventricular response (HCC) 09/30/2019   Leg edema    Left leg cellulitis    Special screening for  malignant neoplasms, colon    Polyp of sigmoid colon    Benign neoplasm of ascending colon    Rectal polyp    Herniated nucleus pulposus, L5-S1 11/09/2015   Low back pain 10/25/2015   Herniated nucleus pulposus, C5-6 right 10/06/2015   Dysesthesia 09/16/2015   Spasticity 09/16/2015   Unsteady gait 09/16/2015   Multiple sclerosis (HCC) 06/08/2011     REFERRING DIAG: R26.81 (ICD-10-CM) - Unsteadiness on feet   THERAPY DIAG:  Unsteadiness on feet  Difficulty in walking, not elsewhere classified  Abnormality of gait and mobility  Muscle weakness (generalized)  Rationale for Evaluation and Treatment Rehabilitation  PERTINENT HISTORY: Patient presents to physical therapy for unsteadiness, walking instability, fatigue. His PMH includes paroxysmal a fib, MS, lymphedema, hypothyroidism, Graves Disease, arthritis, lumbar herniated disc (L5), depression and neuropathy. Patient first developed symptoms of MS in 2005 and was diagnosed in 2006. Patient has done PT in the past, but hasn't been seen since 2020 at [facility]. Has not been doing his exercises in the past year. Walk outside to garage to ride lawnmower, walks in house. Retired Optician, dispensing. Has a rollator at home but doesn't use it in the house   PRECAUTIONS: Fall  SUBJECTIVE: Patient reports doing well - no new complaints- no falls Are you having  pain? no   TODAY'S TREATMENT: 11/29/2022  THEREX: with 5lb AW BLE    - Standing Hip abd using matrix cable - 2.5# AW  3 sets x 10 reps  -Standing hip ext 3 sets x 10 reps using 5 # AW  -Sit to stand x 12 reps without UE Support  - Ambulation x  300 feet x 2 trials with bariatric 1OX and 5# AW        PATIENT EDUCATION: Education details: exercise technique, body mechanics Person educated: Patient Education method: Medical illustrator, verbal cues Education comprehension: verbalized understanding, returned demonstration    HOME EXERCISE PROGRAM: No changes  this session   PT Short Term Goals -       PT SHORT TERM GOAL #1   Title Patient will be independent in home exercise program to improve strength/mobility for better functional independence with ADLs.    Baseline 8/9: HEP to be given next session, 10/31: patient reports compliance with HEP and would like progressions added next session. 08/22/2021=Patient verbalized that he is walking and doing his LE exercises with no questions at this time.    Time 4    Period Weeks    Status Achieved    Target Date 08/16/21              PT Long Term Goals       PT LONG TERM GOAL #1   Title Patient will increase FOTO score to equal to or greater than 70%  to demonstrate statistically significant improvement in mobility and quality of life.    Baseline 8/9: 64%; risk adjusted 34%; 03/21/21 FOTO: 45; 04/24/21 FOTO: 49% ; 08/22/2021= 56% 4/20: 60% 11/28/21: 63%; 01/04/2022= 60%. 03/22/2022= 73%; 10/23/22: 68   Time 12    Period Weeks    Status Achieved   Target Date 06/26/2022     PT LONG TERM GOAL #2   Title Patient will increase Berg Balance score by > 6 points to demonstrate decreased fall risk during functional activities.    Baseline 4/20: 43/56 6/6: 48/56; 713/2023= 50/56   Time 12    Period Weeks    Status Achieved   Target Date 03/29/2022     PT LONG TERM GOAL #3   Title Patient will increase 10 meter walk test to >1.1m/s as to improve gait speed for better community ambulation and to reduce fall risk.    Baseline 8/9: 0.65 m/s with rollator; 10/31: 0.68 m/s with rollator. 07/06/2021= 0.71 m/s using rollator; 08/22/2021= 0.94 m/s using rollator. 10/12/21: 1.1 m/s with rollator; 10/23/22: 0.67m/s    Time 12    Period Weeks    Status Achieved; maintaining gains    Target Date 10/11/21      PT LONG TERM GOAL #4   Title Patient will increase six minute walk test distance to >1000 for progression to community ambulator and improve gait ability    Baseline 8/9: 505 ft with rollator; 03/21/21: 766ft  c rollator, 04/24/21: 718ft c rollator; 07/06/2021=900 feet; 08/22/2021= 935 feet with use of Rollator 44/20: 940 ft 11/28/21: 1055 feet with rollator   Time 12    Period Weeks    Status Achieved    Target Date 01/04/22      PT LONG TERM GOAL #5   Title Patient will increase glute medius strength on Left LE from able to lift off 3" (sidelye on mat) to > 5" to improve stability, gait mechanics, and functional strength.    Baseline 10/31: 3-/5; 07/06/2021= 3-/5;  08/22/2021=3-/5 unable to achieve full ROM yet able to withstand some resistance with testing 4/20: unable to obtainfull ROM against gravity 6/6: unable to move against gravity; 01/04/2022= Continues to have difficulty - unable to raise left LE in sidelye against gravity but able to perform supine left Hip abd with some resistance= 3-/5; 03/22/2022- Patient able to raise his left LE 3 in off mat today. 04/03/2022= Patient able to raise left LE 3" off mat in sidelye today; 05/01/2022= 05/01/2022= patient able to raise left LE from sidelye position= 5"; 06/12/2022- Patient presents with 6.25 in sidelye Left hip abd against gravity. 06/26/2022= 7in in sidelye position.    Time 12    Period Weeks    Status MET   Target Date 06/26/2022     PT LONG TERM GOAL #6   Title Patient will increase Left single leg stance time to 15 seconds or greater to increase safety in shower and independence with ADLs.    Baseline 10/31; 1.83 sec without UE support; 08/22/2021=2-3  sec  on left; 6 sec on right 4/20: RLE 10 seconds LLE unable to perform 6/6: R LE 4 seconds during BERG; 01/04/2022= left = 3 sec and right = 8 sec; 02/13/2022= 20 sec on right at best and 4 sec on left LE at best. 03/23/2022=Patient able to stand on left LE at best for 7 sec today. 04/03/2022= 27 sec on right and 12 sec on left; 06/12/2022= 13 sec on left  and 33 sec at best on right LE; 06/26/2021= 11 sec on left at best and 22 sec on right today; 07/26/2022= 10 sec on left and 20 sec on right today.; 09/04/22:  1-2 seconds on LLE. 27 sec on RLE.    Time 12    Period Weeks    Status On-going    Target Date 09/18/2022     PT LONG TERM GOAL #7   Title Patient will safely negotiate 13 steps with handrails to safely enter/exit mothers house.    Baseline 4/20: very challenging 6/6: able to copmlete safely even managing dogs per pt report; 01/04/2022- Patient performed 16 steps with B Rail-no difficulty other than fatigue.    Time 12    Period Weeks    Status Achieved   Target Date 01/04/22    PT LONG TERM GOAL #8  Title Patient will increase six minute walk test distance to >1100 ft.  for progression to community ambulator and improve gait ability   Baseline 04/03/2022= 920 feet with 4WW - (Added another goal for endurance to continue to progress community amb distances); 05/03/2022= 960 feet with upright 4WW (patient's first time using device): 06/12/2022= 990 feet with 4WW; 06/26/2022= 1010 feet with 4WW; 07/26/2022= 1015 feet; 09/04/22: 1,020'; 09/25/2022= Will reassess next visit. 10/04/2022= 1042ft;  Time 12   Period Weeks   Status Progressing  Target Date 12/18/2022       PT LONG TERM GOAL #9  Title Patient will be able to perform functional activities such as vacuuming or using backpack sprayer with modified independence for improved household chore abilities   Baseline 06/26/2022= Patient unable to perform either task but has strong desire to return to performing if able. 07/26/2022- Patient has not attempted to vacuum yet and PT session focusing on LE strengthening.; 09/04/22: Able to perform vacuuming tasks mod-I. Has to break up tasks due to limited standing tolerance. Has not done outdoor tasks due to weather.  09/25/2022= Patient reports he was successful in vacuuming a few weeks ago- tiring  but able to perform; has not tried spraying or weedeating yet; 4/30: commenced activity simulation in session today. Still unable to perform spraying or edging at home as desired.   Time 12   Period Weeks   Status  Ongoing  Target Date 12/18/2022   PT LONG TERM GOAL #10  Title Patient will increase glute medius strength on Left LE from able to lift off 7" (sidelye on mat) to > 10" to improve Hip stability with standing, gait mechanics, and functional strength.   Baseline 06/26/2022= 7in in sidelye position. 07/26/2022- 7.5 in; 09/04/22: 8"; 09/25/2022: deferred; 4/30: deferred   Time 12   Period Weeks   Status ONGOING  Target Date 12/18/2022     Plan - 12/13/21 1340     Clinical Impression Statement Patient performed well overall- fatigue as limiting factor. Patient was challenged with posterior and lateral hip strengthening and will benefit from continued LE strengthening.  Pt will continue to benefit from skilled PT services to address remaining deficits to maximize his functional independence with mobility and decrease fall risk.    Personal Factors and Comorbidities Age;Comorbidity 3+;Fitness;Past/Current Experience;Time since onset of injury/illness/exacerbation    Comorbidities aroxysmal a fib, MS, lymphedema, hypothyroidism, Graves Disease, arthritis, lumbar herniated disc (L5), depression and neuropathy    Examination-Activity Limitations Bed Mobility;Bend;Caring for Others;Carry;Reach Overhead;Locomotion Level;Lift;Hygiene/Grooming;Dressing;Squat;Stairs;Stand;Toileting;Transfers    Examination-Participation Restrictions Cleaning;Community Activity;Laundry;Occupation;Shop;Volunteer;Yard Work    Conservation officer, historic buildings Evolving/Moderate complexity    Rehab Potential Fair    PT Frequency 1-2x / week    PT Duration 12 weeks    PT Treatment/Interventions ADLs/Self Care Home Management;Aquatic Therapy;Canalith Repostioning;Biofeedback;Cryotherapy;Ultrasound;Traction;Moist Heat;Electrical Stimulation;DME Instruction;Gait training;Therapeutic exercise;Therapeutic activities;Functional mobility training;Stair training;Balance training;Neuromuscular re-education;Patient/family education;Manual  techniques;Orthotic Fit/Training;Compression bandaging;Passive range of motion;Vestibular;Taping;Splinting;Energy conservation;Dry needling;Visual/perceptual remediation/compensation    PT Next Visit Plan Continue with progessive balance training, Progressive LE strenthening as appropriate.    PT Home Exercise Plan Access Code: [email or web address] URL: [email or web address]  Added Bridges with BTB and encouraged more walking around the home during commerical breaks when watching TV.    Consulted and Agree with Plan of Care Patient            3:12 PM, 11/29/22  3:12 PM, 11/29/22 Louis Meckel, PT Physical Therapist - [facility]  Outpatient Physical Therapy- Main Campus [phone]

## 2022-12-04 ENCOUNTER — Ambulatory Visit: Payer: Medicare PPO

## 2022-12-04 DIAGNOSIS — R278 Other lack of coordination: Secondary | ICD-10-CM

## 2022-12-04 DIAGNOSIS — R262 Difficulty in walking, not elsewhere classified: Secondary | ICD-10-CM

## 2022-12-04 DIAGNOSIS — R2681 Unsteadiness on feet: Secondary | ICD-10-CM | POA: Diagnosis not present

## 2022-12-04 DIAGNOSIS — R269 Unspecified abnormalities of gait and mobility: Secondary | ICD-10-CM

## 2022-12-04 DIAGNOSIS — M6281 Muscle weakness (generalized): Secondary | ICD-10-CM | POA: Diagnosis not present

## 2022-12-04 DIAGNOSIS — R2689 Other abnormalities of gait and mobility: Secondary | ICD-10-CM

## 2022-12-04 NOTE — Therapy (Signed)
OUTPATIENT PHYSICAL THERAPY TREATMENT/Physical Therapy Progress Note   Dates of reporting period  10/23/2022   to   12/04/2022     Patient Name: Dustin Gray MRN: [id number] DOB:Aug 25, 1955, 66 y.o., male Today's Date: 12/05/2022  PCP: Blair Heys, MD REFERRING PROVIDER: Shirlean Schlein, MD   PT End of Session - 12/04/22 1527     Visit Number 140    Number of Visits 148    Date for PT Re-Evaluation 12/18/22    Authorization Type [employer] Auth 3/26-5/30/24- 15 visits    Authorization Time Period 4/2-6/25/24    Progress Note Due on Visit 150    PT Start Time 1432    PT Stop Time 1515    PT Time Calculation (min) 43 min    Equipment Utilized During Treatment Gait belt    Activity Tolerance Patient tolerated treatment well;No increased pain    Behavior During Therapy WFL for tasks assessed/performed               Past Medical History:  Diagnosis Date   Abscess    groin   Arthritis    lower spine   Erectile dysfunction    Low testosterone    Lumbar herniated disc    L5   Multiple sclerosis (HCC)    Staph aureus infection    Past Surgical History:  Procedure Laterality Date   CARDIOVERSION N/A 11/22/2021   Procedure: CARDIOVERSION;  Surgeon: Debbe Odea, MD;  Location: [facility];  Service: Cardiovascular;  Laterality: N/A;   COLONOSCOPY WITH PROPOFOL N/A 07/04/2016   Procedure: COLONOSCOPY WITH PROPOFOL;  Surgeon: Midge Minium, MD;  Location: [facility];  Service: Endoscopy;  Laterality: N/A;   FINGER SURGERY  1964   MASS EXCISION  1960   POLYPECTOMY  07/04/2016   Procedure: POLYPECTOMY;  Surgeon: Midge Minium, MD;  Location: [facility];  Service: Endoscopy;;   TONSILLECTOMY     Patient Active Problem List   Diagnosis Date Noted   Persistent atrial fibrillation (HCC)    Graves disease    Abdominal bloating    Atrial fibrillation with RVR (HCC) 10/01/2019   Hyperthyroidism    Atrial fibrillation with rapid ventricular  response (HCC) 09/30/2019   Leg edema    Left leg cellulitis    Special screening for malignant neoplasms, colon    Polyp of sigmoid colon    Benign neoplasm of ascending colon    Rectal polyp    Herniated nucleus pulposus, L5-S1 11/09/2015   Low back pain 10/25/2015   Herniated nucleus pulposus, C5-6 right 10/06/2015   Dysesthesia 09/16/2015   Spasticity 09/16/2015   Unsteady gait 09/16/2015   Multiple sclerosis (HCC) 06/08/2011     REFERRING DIAG: R26.81 (ICD-10-CM) - Unsteadiness on feet   THERAPY DIAG:  Unsteadiness on feet  Difficulty in walking, not elsewhere classified  Abnormality of gait and mobility  Muscle weakness (generalized)  Rationale for Evaluation and Treatment Rehabilitation  PERTINENT HISTORY: Patient presents to physical therapy for unsteadiness, walking instability, fatigue. His PMH includes paroxysmal a fib, MS, lymphedema, hypothyroidism, Graves Disease, arthritis, lumbar herniated disc (L5), depression and neuropathy. Patient first developed symptoms of MS in 2005 and was diagnosed in 2006. Patient has done PT in the past, but hasn't been seen since 2020 at [facility]. Has not been doing his exercises in the past year. Walk outside to garage to ride lawnmower, walks in house. Retired Optician, dispensing. Has a rollator at home but doesn't use it in the house  PRECAUTIONS: Fall  SUBJECTIVE: Patient reports having a good week so far with no pain or any new issues.    Are you having pain? no   TODAY'S TREATMENT: 12/04/2022  THEREX: with 5lb AW BLE   -Sit to stand x 15 reps x 3 total sets today (from slightly raised 23" mat table height)   -Hip march (Seated at edge of mat) 5# AW -2 sets of 10 reps each LE  -Seated hip IR (ball squeeze while pulling feet apart with RTB) x 10 reps each LE- Patient reported as hard  - Standing Hip flex+ER (Combo movement) x 10 rep each LE  - Ambulation x  >500 feet  with bariatric 4WW and 5# AW with 1 seated rest  break with good left LE Swing and no dragging observed today.         PATIENT EDUCATION: Education details: exercise technique, body mechanics Person educated: Patient Education method: Medical illustrator, verbal cues Education comprehension: verbalized understanding, returned demonstration    HOME EXERCISE PROGRAM: No changes this session   PT Short Term Goals -       PT SHORT TERM GOAL #1   Title Patient will be independent in home exercise program to improve strength/mobility for better functional independence with ADLs.    Baseline 8/9: HEP to be given next session, 10/31: patient reports compliance with HEP and would like progressions added next session. 08/22/2021=Patient verbalized that he is walking and doing his LE exercises with no questions at this time.    Time 4    Period Weeks    Status Achieved    Target Date 08/16/21              PT Long Term Goals       PT LONG TERM GOAL #1   Title Patient will increase FOTO score to equal to or greater than 70%  to demonstrate statistically significant improvement in mobility and quality of life.    Baseline 8/9: 64%; risk adjusted 34%; 03/21/21 FOTO: 45; 04/24/21 FOTO: 49% ; 08/22/2021= 56% 4/20: 60% 11/28/21: 63%; 01/04/2022= 60%. 03/22/2022= 73%; 10/23/22: 68   Time 12    Period Weeks    Status Achieved   Target Date 06/26/2022     PT LONG TERM GOAL #2   Title Patient will increase Berg Balance score by > 6 points to demonstrate decreased fall risk during functional activities.    Baseline 4/20: 43/56 6/6: 48/56; 713/2023= 50/56   Time 12    Period Weeks    Status Achieved   Target Date 03/29/2022     PT LONG TERM GOAL #3   Title Patient will increase 10 meter walk test to >1.64m/s as to improve gait speed for better community ambulation and to reduce fall risk.    Baseline 8/9: 0.65 m/s with rollator; 10/31: 0.68 m/s with rollator. 07/06/2021= 0.71 m/s using rollator; 08/22/2021= 0.94 m/s using rollator.  10/12/21: 1.1 m/s with rollator; 10/23/22: 0.2m/s    Time 12    Period Weeks    Status Achieved; maintaining gains    Target Date 10/11/21      PT LONG TERM GOAL #4   Title Patient will increase six minute walk test distance to >1000 for progression to community ambulator and improve gait ability    Baseline 8/9: 505 ft with rollator; 03/21/21: 747ft c rollator, 04/24/21: 776ft c rollator; 07/06/2021=900 feet; 08/22/2021= 935 feet with use of Rollator 44/20: 940 ft 11/28/21: 1055 feet with rollator  Time 12    Period Weeks    Status Achieved    Target Date 01/04/22      PT LONG TERM GOAL #5   Title Patient will increase glute medius strength on Left LE from able to lift off 3" (sidelye on mat) to > 5" to improve stability, gait mechanics, and functional strength.    Baseline 10/31: 3-/5; 07/06/2021= 3-/5; 08/22/2021=3-/5 unable to achieve full ROM yet able to withstand some resistance with testing 4/20: unable to obtainfull ROM against gravity 6/6: unable to move against gravity; 01/04/2022= Continues to have difficulty - unable to raise left LE in sidelye against gravity but able to perform supine left Hip abd with some resistance= 3-/5; 03/22/2022- Patient able to raise his left LE 3 in off mat today. 04/03/2022= Patient able to raise left LE 3" off mat in sidelye today; 05/01/2022= 05/01/2022= patient able to raise left LE from sidelye position= 5"; 06/12/2022- Patient presents with 6.25 in sidelye Left hip abd against gravity. 06/26/2022= 7in in sidelye position.    Time 12    Period Weeks    Status MET   Target Date 06/26/2022     PT LONG TERM GOAL #6   Title Patient will increase Left single leg stance time to 15 seconds or greater to increase safety in shower and independence with ADLs.    Baseline 10/31; 1.83 sec without UE support; 08/22/2021=2-3  sec  on left; 6 sec on right 4/20: RLE 10 seconds LLE unable to perform 6/6: R LE 4 seconds during BERG; 01/04/2022= left = 3 sec and right = 8 sec;  02/13/2022= 20 sec on right at best and 4 sec on left LE at best. 03/23/2022=Patient able to stand on left LE at best for 7 sec today. 04/03/2022= 27 sec on right and 12 sec on left; 06/12/2022= 13 sec on left  and 33 sec at best on right LE; 06/26/2021= 11 sec on left at best and 22 sec on right today; 07/26/2022= 10 sec on left and 20 sec on right today.; 09/04/22: 1-2 seconds on LLE. 27 sec on RLE. 12/04/2022= Will assess next visit   Time 12    Period Weeks    Status On-going    Target Date 12/18/2022     PT LONG TERM GOAL #7   Title Patient will safely negotiate 13 steps with handrails to safely enter/exit mothers house.    Baseline 4/20: very challenging 6/6: able to copmlete safely even managing dogs per pt report; 01/04/2022- Patient performed 16 steps with B Rail-no difficulty other than fatigue.    Time 12    Period Weeks    Status Achieved   Target Date 01/04/22    PT LONG TERM GOAL #8  Title Patient will increase six minute walk test distance to >1100 ft.  for progression to community ambulator and improve gait ability   Baseline 04/03/2022= 920 feet with 4WW - (Added another goal for endurance to continue to progress community amb distances); 05/03/2022= 960 feet with upright 4WW (patient's first time using device): 06/12/2022= 990 feet with 4WW; 06/26/2022= 1010 feet with 4WW; 07/26/2022= 1015 feet; 09/04/22: 1,020'; 09/25/2022= Will reassess next visit. 10/04/2022= 1037ft;  Time 12   Period Weeks   Status Progressing  Target Date 12/18/2022       PT LONG TERM GOAL #9  Title Patient will be able to perform functional activities such as vacuuming or using backpack sprayer with modified independence for improved household chore abilities  Baseline 06/26/2022= Patient unable to perform either task but has strong desire to return to performing if able. 07/26/2022- Patient has not attempted to vacuum yet and PT session focusing on LE strengthening.; 09/04/22: Able to perform vacuuming tasks mod-I. Has  to break up tasks due to limited standing tolerance. Has not done outdoor tasks due to weather.  09/25/2022= Patient reports he was successful in vacuuming a few weeks ago- tiring but able to perform; has not tried spraying or weedeating yet; 4/30: commenced activity simulation in session today. Still unable to perform spraying or edging at home as desired. 12/04/2022= Will reassess next visit  Time 12   Period Weeks   Status Ongoing  Target Date 12/18/2022   PT LONG TERM GOAL #10  Title Patient will increase glute medius strength on Left LE from able to lift off 7" (sidelye on mat) to > 10" to improve Hip stability with standing, gait mechanics, and functional strength.   Baseline 06/26/2022= 7in in sidelye position. 07/26/2022- 7.5 in; 09/04/22: 8"; 09/25/2022: deferred; 4/30: deferred ; 12/05/2022= Will assess next visit  Time 12   Period Weeks   Status ONGOING  Target Date 12/18/2022     Plan - 12/13/21 1340     Clinical Impression Statement Patient continues to progress his overall left LE strength as seen by ability to walk further with no foot drag. He continues to participate in more advance resistive LE strengthening with fatigue as only limiting factor. He remains well motivated to achieve improved left LE strength and participates well with mostly only VC for best technique with all exercises.   Pt will continue to benefit from skilled PT services to address remaining deficits to maximize his functional independence with mobility and decrease fall risk. Patient's condition has the potential to improve in response to therapy. Maximum improvement is yet to be obtained. The anticipated improvement is attainable and reasonable in a generally predictable time.    Personal Factors and Comorbidities Age;Comorbidity 3+;Fitness;Past/Current Experience;Time since onset of injury/illness/exacerbation    Comorbidities aroxysmal a fib, MS, lymphedema, hypothyroidism, Graves Disease, arthritis, lumbar herniated  disc (L5), depression and neuropathy    Examination-Activity Limitations Bed Mobility;Bend;Caring for Others;Carry;Reach Overhead;Locomotion Level;Lift;Hygiene/Grooming;Dressing;Squat;Stairs;Stand;Toileting;Transfers    Examination-Participation Restrictions Cleaning;Community Activity;Laundry;Occupation;Shop;Volunteer;Yard Work    Conservation officer, historic buildings Evolving/Moderate complexity    Rehab Potential Fair    PT Frequency 1-2x / week    PT Duration 12 weeks    PT Treatment/Interventions ADLs/Self Care Home Management;Aquatic Therapy;Canalith Repostioning;Biofeedback;Cryotherapy;Ultrasound;Traction;Moist Heat;Electrical Stimulation;DME Instruction;Gait training;Therapeutic exercise;Therapeutic activities;Functional mobility training;Stair training;Balance training;Neuromuscular re-education;Patient/family education;Manual techniques;Orthotic Fit/Training;Compression bandaging;Passive range of motion;Vestibular;Taping;Splinting;Energy conservation;Dry needling;Visual/perceptual remediation/compensation    PT Next Visit Plan Continue with progessive balance training, Progressive LE strenthening as appropriate.    PT Home Exercise Plan Access Code: [email or web address] URL: [email or web address]  Added Bridges with BTB and encouraged more walking around the home during commerical breaks when watching TV.    Consulted and Agree with Plan of Care Patient            10:33 AM, 12/05/22  10:33 AM, 12/05/22 Louis Meckel, PT Physical Therapist - [facility]  Outpatient Physical Therapy- Main Campus [phone]

## 2022-12-05 NOTE — Therapy (Signed)
OUTPATIENT PHYSICAL THERAPY TREATMENT   Patient Name: Dustin Gray MRN: [id number] DOB:05/22/1956, 67 y.o., male Today's Date: 12/09/2022  PCP: Blair Heys, MD REFERRING PROVIDER: Shirlean Schlein, MD   PT End of Session - 12/09/22 2242     Visit Number 141    Number of Visits 148    Date for PT Re-Evaluation 12/18/22    Authorization Type [employer] Auth 3/26-5/30/24- 15 visits    Authorization Time Period 4/2-6/25/24    Progress Note Due on Visit 150    PT Start Time 1437    PT Stop Time 1515    PT Time Calculation (min) 38 min    Equipment Utilized During Treatment Gait belt    Activity Tolerance Patient tolerated treatment well;No increased pain    Behavior During Therapy WFL for tasks assessed/performed                Past Medical History:  Diagnosis Date   Abscess    groin   Arthritis    lower spine   Erectile dysfunction    Low testosterone    Lumbar herniated disc    L5   Multiple sclerosis (HCC)    Staph aureus infection    Past Surgical History:  Procedure Laterality Date   CARDIOVERSION N/A 11/22/2021   Procedure: CARDIOVERSION;  Surgeon: Debbe Odea, MD;  Location: [facility];  Service: Cardiovascular;  Laterality: N/A;   COLONOSCOPY WITH PROPOFOL N/A 07/04/2016   Procedure: COLONOSCOPY WITH PROPOFOL;  Surgeon: Midge Minium, MD;  Location: [facility];  Service: Endoscopy;  Laterality: N/A;   FINGER SURGERY  1964   MASS EXCISION  1960   POLYPECTOMY  07/04/2016   Procedure: POLYPECTOMY;  Surgeon: Midge Minium, MD;  Location: [facility];  Service: Endoscopy;;   TONSILLECTOMY     Patient Active Problem List   Diagnosis Date Noted   Persistent atrial fibrillation (HCC)    Graves disease    Abdominal bloating    Atrial fibrillation with RVR (HCC) 10/01/2019   Hyperthyroidism    Atrial fibrillation with rapid ventricular response (HCC) 09/30/2019   Leg edema    Left leg cellulitis    Special screening for  malignant neoplasms, colon    Polyp of sigmoid colon    Benign neoplasm of ascending colon    Rectal polyp    Herniated nucleus pulposus, L5-S1 11/09/2015   Low back pain 10/25/2015   Herniated nucleus pulposus, C5-6 right 10/06/2015   Dysesthesia 09/16/2015   Spasticity 09/16/2015   Unsteady gait 09/16/2015   Multiple sclerosis (HCC) 06/08/2011     REFERRING DIAG: R26.81 (ICD-10-CM) - Unsteadiness on feet   THERAPY DIAG:  Unsteadiness on feet  Difficulty in walking, not elsewhere classified  Abnormality of gait and mobility  Muscle weakness (generalized)  Rationale for Evaluation and Treatment Rehabilitation  PERTINENT HISTORY: Patient presents to physical therapy for unsteadiness, walking instability, fatigue. His PMH includes paroxysmal a fib, MS, lymphedema, hypothyroidism, Graves Disease, arthritis, lumbar herniated disc (L5), depression and neuropathy. Patient first developed symptoms of MS in 2005 and was diagnosed in 2006. Patient has done PT in the past, but hasn't been seen since 2020 at [facility]. Has not been doing his exercises in the past year. Walk outside to garage to ride lawnmower, walks in house. Retired Optician, dispensing. Has a rollator at home but doesn't use it in the house   PRECAUTIONS: Fall  SUBJECTIVE: Patient reports sore after last visit but doing okay overall.    Are  you having pain? no   TODAY'S TREATMENT: 12/06/2022   Physical therapy treatment session today consisted of completing assessment of goals and administration of testing as demonstrated and documented in flow sheet, treatment, and goals section of this note. Addition treatments may be found below.   THEREX:  Sidelye clamshell 3 sets of 10 reps  Sidelye AAROM Left hip abd- 3 sets of 10 reps Standing bent over hip ext B LE 1 set of 10 reps   .         PATIENT EDUCATION: Education details: exercise technique, body mechanics Person educated: Patient Education method:  Medical illustrator, verbal cues Education comprehension: verbalized understanding, returned demonstration    HOME EXERCISE PROGRAM: No changes this session   PT Short Term Goals -       PT SHORT TERM GOAL #1   Title Patient will be independent in home exercise program to improve strength/mobility for better functional independence with ADLs.    Baseline 8/9: HEP to be given next session, 10/31: patient reports compliance with HEP and would like progressions added next session. 08/22/2021=Patient verbalized that he is walking and doing his LE exercises with no questions at this time.    Time 4    Period Weeks    Status Achieved    Target Date 08/16/21              PT Long Term Goals       PT LONG TERM GOAL #1   Title Patient will increase FOTO score to equal to or greater than 70%  to demonstrate statistically significant improvement in mobility and quality of life.    Baseline 8/9: 64%; risk adjusted 34%; 03/21/21 FOTO: 45; 04/24/21 FOTO: 49% ; 08/22/2021= 56% 4/20: 60% 11/28/21: 63%; 01/04/2022= 60%. 03/22/2022= 73%; 10/23/22: 68   Time 12    Period Weeks    Status Achieved   Target Date 06/26/2022     PT LONG TERM GOAL #2   Title Patient will increase Berg Balance score by > 6 points to demonstrate decreased fall risk during functional activities.    Baseline 4/20: 43/56 6/6: 48/56; 713/2023= 50/56   Time 12    Period Weeks    Status Achieved   Target Date 03/29/2022     PT LONG TERM GOAL #3   Title Patient will increase 10 meter walk test to >1.43m/s as to improve gait speed for better community ambulation and to reduce fall risk.    Baseline 8/9: 0.65 m/s with rollator; 10/31: 0.68 m/s with rollator. 07/06/2021= 0.71 m/s using rollator; 08/22/2021= 0.94 m/s using rollator. 10/12/21: 1.1 m/s with rollator; 10/23/22: 0.13m/s    Time 12    Period Weeks    Status Achieved; maintaining gains    Target Date 10/11/21      PT LONG TERM GOAL #4   Title Patient will  increase six minute walk test distance to >1000 for progression to community ambulator and improve gait ability    Baseline 8/9: 505 ft with rollator; 03/21/21: 744ft c rollator, 04/24/21: 766ft c rollator; 07/06/2021=900 feet; 08/22/2021= 935 feet with use of Rollator 44/20: 940 ft 11/28/21: 1055 feet with rollator   Time 12    Period Weeks    Status Achieved    Target Date 01/04/22      PT LONG TERM GOAL #5   Title Patient will increase glute medius strength on Left LE from able to lift off 3" (sidelye on mat) to > 5" to improve stability,  gait mechanics, and functional strength.    Baseline 10/31: 3-/5; 07/06/2021= 3-/5; 08/22/2021=3-/5 unable to achieve full ROM yet able to withstand some resistance with testing 4/20: unable to obtainfull ROM against gravity 6/6: unable to move against gravity; 01/04/2022= Continues to have difficulty - unable to raise left LE in sidelye against gravity but able to perform supine left Hip abd with some resistance= 3-/5; 03/22/2022- Patient able to raise his left LE 3 in off mat today. 04/03/2022= Patient able to raise left LE 3" off mat in sidelye today; 05/01/2022= 05/01/2022= patient able to raise left LE from sidelye position= 5"; 06/12/2022- Patient presents with 6.25 in sidelye Left hip abd against gravity. 06/26/2022= 7in in sidelye position.    Time 12    Period Weeks    Status MET   Target Date 06/26/2022     PT LONG TERM GOAL #6   Title Patient will increase Left single leg stance time to 15 seconds or greater to increase safety in shower and independence with ADLs.    Baseline 10/31; 1.83 sec without UE support; 08/22/2021=2-3  sec  on left; 6 sec on right 4/20: RLE 10 seconds LLE unable to perform 6/6: R LE 4 seconds during BERG; 01/04/2022= left = 3 sec and right = 8 sec; 02/13/2022= 20 sec on right at best and 4 sec on left LE at best. 03/23/2022=Patient able to stand on left LE at best for 7 sec today. 04/03/2022= 27 sec on right and 12 sec on left; 06/12/2022= 13  sec on left  and 33 sec at best on right LE; 06/26/2021= 11 sec on left at best and 22 sec on right today; 07/26/2022= 10 sec on left and 20 sec on right today.; 09/04/22: 1-2 seconds on LLE. 27 sec on RLE. 12/04/2022= Will assess next visit   Time 12    Period Weeks    Status On-going    Target Date 12/18/2022     PT LONG TERM GOAL #7   Title Patient will safely negotiate 13 steps with handrails to safely enter/exit mothers house.    Baseline 4/20: very challenging 6/6: able to copmlete safely even managing dogs per pt report; 01/04/2022- Patient performed 16 steps with B Rail-no difficulty other than fatigue.    Time 12    Period Weeks    Status Achieved   Target Date 01/04/22    PT LONG TERM GOAL #8  Title Patient will increase six minute walk test distance to >1100 ft.  for progression to community ambulator and improve gait ability   Baseline 04/03/2022= 920 feet with 4WW - (Added another goal for endurance to continue to progress community amb distances); 05/03/2022= 960 feet with upright 4WW (patient's first time using device): 06/12/2022= 990 feet with 4WW; 06/26/2022= 1010 feet with 4WW; 07/26/2022= 1015 feet; 09/04/22: 1,020'; 09/25/2022= Will reassess next visit. 10/04/2022= 1035ft; 12/06/2022= 900 feet  Time 12   Period Weeks   Status ONGOING  Target Date 12/18/2022       PT LONG TERM GOAL #9  Title Patient will be able to perform functional activities such as vacuuming or using backpack sprayer with modified independence for improved household chore abilities   Baseline 06/26/2022= Patient unable to perform either task but has strong desire to return to performing if able. 07/26/2022- Patient has not attempted to vacuum yet and PT session focusing on LE strengthening.; 09/04/22: Able to perform vacuuming tasks mod-I. Has to break up tasks due to limited standing tolerance. Has not  done outdoor tasks due to weather.  09/25/2022= Patient reports he was successful in vacuuming a few weeks ago- tiring but  able to perform; has not tried spraying or weedeating yet; 4/30: commenced activity simulation in session today. Still unable to perform spraying or edging at home as desired. 12/04/2022= Will reassess next visit; 12/06/2022= Patient reports plans to try before next visit.   Time 12   Period Weeks   Status Ongoing  Target Date 12/18/2022   PT LONG TERM GOAL #10  Title Patient will increase glute medius strength on Left LE from able to lift off 7" (sidelye on mat) to > 10" to improve Hip stability with standing, gait mechanics, and functional strength.   Baseline 06/26/2022= 7in in sidelye position. 07/26/2022- 7.5 in; 09/04/22: 8"; 09/25/2022: deferred; 4/30: deferred ; 12/04/2022= Will assess next visit; 12/06/2022= 5"  Time 12   Period Weeks   Status ONGOING  Target Date 12/18/2022     Plan - 12/13/21 1340     Clinical Impression Statement Patient presents today with some increased Left LE weakness vs previous sessions. He states he feels like his MD is flared and can feel his Left LE weakness. He did exhibit slight decline in overall left hip strength and 6 min walk test- dragging Left LE more.  Pt will continue to benefit from skilled PT services to address remaining deficits to maximize his functional independence with mobility and decrease fall risk.    Personal Factors and Comorbidities Age;Comorbidity 3+;Fitness;Past/Current Experience;Time since onset of injury/illness/exacerbation    Comorbidities aroxysmal a fib, MS, lymphedema, hypothyroidism, Graves Disease, arthritis, lumbar herniated disc (L5), depression and neuropathy    Examination-Activity Limitations Bed Mobility;Bend;Caring for Others;Carry;Reach Overhead;Locomotion Level;Lift;Hygiene/Grooming;Dressing;Squat;Stairs;Stand;Toileting;Transfers    Examination-Participation Restrictions Cleaning;Community Activity;Laundry;Occupation;Shop;Volunteer;Yard Work    Conservation officer, historic buildings Evolving/Moderate complexity    Rehab  Potential Fair    PT Frequency 1-2x / week    PT Duration 12 weeks    PT Treatment/Interventions ADLs/Self Care Home Management;Aquatic Therapy;Canalith Repostioning;Biofeedback;Cryotherapy;Ultrasound;Traction;Moist Heat;Electrical Stimulation;DME Instruction;Gait training;Therapeutic exercise;Therapeutic activities;Functional mobility training;Stair training;Balance training;Neuromuscular re-education;Patient/family education;Manual techniques;Orthotic Fit/Training;Compression bandaging;Passive range of motion;Vestibular;Taping;Splinting;Energy conservation;Dry needling;Visual/perceptual remediation/compensation    PT Next Visit Plan Continue with progessive balance training, Progressive LE strenthening as appropriate.    PT Home Exercise Plan Access Code: [email or web address] URL: [email or web address]  Added Bridges with BTB and encouraged more walking around the home during commerical breaks when watching TV.    Consulted and Agree with Plan of Care Patient            10:44 PM, 12/09/22  10:44 PM, 12/09/22 Louis Meckel, PT Physical Therapist - [facility]  Outpatient Physical Therapy- Main Campus [phone]

## 2022-12-06 ENCOUNTER — Ambulatory Visit: Payer: Medicare PPO

## 2022-12-06 DIAGNOSIS — R2681 Unsteadiness on feet: Secondary | ICD-10-CM | POA: Diagnosis not present

## 2022-12-06 DIAGNOSIS — R262 Difficulty in walking, not elsewhere classified: Secondary | ICD-10-CM | POA: Diagnosis not present

## 2022-12-06 DIAGNOSIS — R2689 Other abnormalities of gait and mobility: Secondary | ICD-10-CM | POA: Diagnosis not present

## 2022-12-06 DIAGNOSIS — M6281 Muscle weakness (generalized): Secondary | ICD-10-CM | POA: Diagnosis not present

## 2022-12-06 DIAGNOSIS — R278 Other lack of coordination: Secondary | ICD-10-CM | POA: Diagnosis not present

## 2022-12-06 DIAGNOSIS — R269 Unspecified abnormalities of gait and mobility: Secondary | ICD-10-CM

## 2022-12-11 ENCOUNTER — Ambulatory Visit: Payer: Medicare PPO

## 2022-12-13 ENCOUNTER — Ambulatory Visit: Payer: Medicare PPO

## 2022-12-13 DIAGNOSIS — R278 Other lack of coordination: Secondary | ICD-10-CM

## 2022-12-13 DIAGNOSIS — M6281 Muscle weakness (generalized): Secondary | ICD-10-CM | POA: Diagnosis not present

## 2022-12-13 DIAGNOSIS — R2689 Other abnormalities of gait and mobility: Secondary | ICD-10-CM | POA: Diagnosis not present

## 2022-12-13 DIAGNOSIS — R2681 Unsteadiness on feet: Secondary | ICD-10-CM

## 2022-12-13 DIAGNOSIS — R269 Unspecified abnormalities of gait and mobility: Secondary | ICD-10-CM | POA: Diagnosis not present

## 2022-12-13 DIAGNOSIS — R262 Difficulty in walking, not elsewhere classified: Secondary | ICD-10-CM | POA: Diagnosis not present

## 2022-12-13 NOTE — Therapy (Signed)
OUTPATIENT PHYSICAL THERAPY TREATMENT   Patient Name: Dustin Gray MRN: [id number] DOB:03/24/1956, 67 y.o., male Today's Date: 12/13/2022  PCP: Blair Heys, MD REFERRING PROVIDER: Shirlean Schlein, MD   PT End of Session - 12/13/22 1453     Visit Number 142    Number of Visits 148    Date for PT Re-Evaluation 12/18/22    Authorization Type [employer] Auth 3/26-5/30/24- 15 visits    Authorization Time Period 4/2-6/25/24    Progress Note Due on Visit 150    PT Start Time 1433    PT Stop Time 1515    PT Time Calculation (min) 42 min    Equipment Utilized During Treatment Gait belt    Activity Tolerance Patient tolerated treatment well;No increased pain    Behavior During Therapy WFL for tasks assessed/performed                Past Medical History:  Diagnosis Date   Abscess    groin   Arthritis    lower spine   Erectile dysfunction    Low testosterone    Lumbar herniated disc    L5   Multiple sclerosis (HCC)    Staph aureus infection    Past Surgical History:  Procedure Laterality Date   CARDIOVERSION N/A 11/22/2021   Procedure: CARDIOVERSION;  Surgeon: Debbe Odea, MD;  Location: [facility];  Service: Cardiovascular;  Laterality: N/A;   COLONOSCOPY WITH PROPOFOL N/A 07/04/2016   Procedure: COLONOSCOPY WITH PROPOFOL;  Surgeon: Midge Minium, MD;  Location: [facility];  Service: Endoscopy;  Laterality: N/A;   FINGER SURGERY  1964   MASS EXCISION  1960   POLYPECTOMY  07/04/2016   Procedure: POLYPECTOMY;  Surgeon: Midge Minium, MD;  Location: [facility];  Service: Endoscopy;;   TONSILLECTOMY     Patient Active Problem List   Diagnosis Date Noted   Persistent atrial fibrillation (HCC)    Graves disease    Abdominal bloating    Atrial fibrillation with RVR (HCC) 10/01/2019   Hyperthyroidism    Atrial fibrillation with rapid ventricular response (HCC) 09/30/2019   Leg edema    Left leg cellulitis    Special screening for  malignant neoplasms, colon    Polyp of sigmoid colon    Benign neoplasm of ascending colon    Rectal polyp    Herniated nucleus pulposus, L5-S1 11/09/2015   Low back pain 10/25/2015   Herniated nucleus pulposus, C5-6 right 10/06/2015   Dysesthesia 09/16/2015   Spasticity 09/16/2015   Unsteady gait 09/16/2015   Multiple sclerosis (HCC) 06/08/2011     REFERRING DIAG: R26.81 (ICD-10-CM) - Unsteadiness on feet   THERAPY DIAG:  Unsteadiness on feet  Difficulty in walking, not elsewhere classified  Abnormality of gait and mobility  Muscle weakness (generalized)  Rationale for Evaluation and Treatment Rehabilitation  PERTINENT HISTORY: Patient presents to physical therapy for unsteadiness, walking instability, fatigue. His PMH includes paroxysmal a fib, MS, lymphedema, hypothyroidism, Graves Disease, arthritis, lumbar herniated disc (L5), depression and neuropathy. Patient first developed symptoms of MS in 2005 and was diagnosed in 2006. Patient has done PT in the past, but hasn't been seen since 2020 at [facility]. Has not been doing his exercises in the past year. Walk outside to garage to ride lawnmower, walks in house. Retired Optician, dispensing. Has a rollator at home but doesn't use it in the house   PRECAUTIONS: Fall  SUBJECTIVE: Patient reports sore after last visit but doing okay overall.    Are  you having pain? no   TODAY'S TREATMENT: 12/13/2022   Physical therapy treatment session today consisted of completing assessment of goals and administration of testing as demonstrated and documented in flow sheet, treatment, and goals section of this note. Addition treatments may be found below.   THEREX:    Seated hip march (6#)  from edge of mat x 12 reps each LE Seated knee ext  (6#) from edge of mat x 12 reps each LE Gait with 4WW and (6#) 160 feet - decreased left LE foot clearance Standing bent over hip ext B LE 1 set of 10 reps  Standing hip abd +ER x 8 reps each  LE Gait with 4WW and (6#)- 160 feet- decreased left LE foot clearance  Pyramid sit to stand 12 reps- 24.5 in 10 reps - 23 in 8 reps - 21.5 in 8 reps- 20 in    .         PATIENT EDUCATION: Education details: exercise technique, body mechanics Person educated: Patient Education method: Medical illustrator, verbal cues Education comprehension: verbalized understanding, returned demonstration    HOME EXERCISE PROGRAM: No changes this session   PT Short Term Goals -       PT SHORT TERM GOAL #1   Title Patient will be independent in home exercise program to improve strength/mobility for better functional independence with ADLs.    Baseline 8/9: HEP to be given next session, 10/31: patient reports compliance with HEP and would like progressions added next session. 08/22/2021=Patient verbalized that he is walking and doing his LE exercises with no questions at this time.    Time 4    Period Weeks    Status Achieved    Target Date 08/16/21              PT Long Term Goals       PT LONG TERM GOAL #1   Title Patient will increase FOTO score to equal to or greater than 70%  to demonstrate statistically significant improvement in mobility and quality of life.    Baseline 8/9: 64%; risk adjusted 34%; 03/21/21 FOTO: 45; 04/24/21 FOTO: 49% ; 08/22/2021= 56% 4/20: 60% 11/28/21: 63%; 01/04/2022= 60%. 03/22/2022= 73%; 10/23/22: 68   Time 12    Period Weeks    Status Achieved   Target Date 06/26/2022     PT LONG TERM GOAL #2   Title Patient will increase Berg Balance score by > 6 points to demonstrate decreased fall risk during functional activities.    Baseline 4/20: 43/56 6/6: 48/56; 713/2023= 50/56   Time 12    Period Weeks    Status Achieved   Target Date 03/29/2022     PT LONG TERM GOAL #3   Title Patient will increase 10 meter walk test to >1.22m/s as to improve gait speed for better community ambulation and to reduce fall risk.    Baseline 8/9: 0.65 m/s with  rollator; 10/31: 0.68 m/s with rollator. 07/06/2021= 0.71 m/s using rollator; 08/22/2021= 0.94 m/s using rollator. 10/12/21: 1.1 m/s with rollator; 10/23/22: 0.42m/s    Time 12    Period Weeks    Status Achieved; maintaining gains    Target Date 10/11/21      PT LONG TERM GOAL #4   Title Patient will increase six minute walk test distance to >1000 for progression to community ambulator and improve gait ability    Baseline 8/9: 505 ft with rollator; 03/21/21: 778ft c rollator, 04/24/21: 736ft c rollator; 07/06/2021=900 feet; 08/22/2021=  935 feet with use of Rollator 44/20: 940 ft 11/28/21: 1055 feet with rollator   Time 12    Period Weeks    Status Achieved    Target Date 01/04/22      PT LONG TERM GOAL #5   Title Patient will increase glute medius strength on Left LE from able to lift off 3" (sidelye on mat) to > 5" to improve stability, gait mechanics, and functional strength.    Baseline 10/31: 3-/5; 07/06/2021= 3-/5; 08/22/2021=3-/5 unable to achieve full ROM yet able to withstand some resistance with testing 4/20: unable to obtainfull ROM against gravity 6/6: unable to move against gravity; 01/04/2022= Continues to have difficulty - unable to raise left LE in sidelye against gravity but able to perform supine left Hip abd with some resistance= 3-/5; 03/22/2022- Patient able to raise his left LE 3 in off mat today. 04/03/2022= Patient able to raise left LE 3" off mat in sidelye today; 05/01/2022= 05/01/2022= patient able to raise left LE from sidelye position= 5"; 06/12/2022- Patient presents with 6.25 in sidelye Left hip abd against gravity. 06/26/2022= 7in in sidelye position.    Time 12    Period Weeks    Status MET   Target Date 06/26/2022     PT LONG TERM GOAL #6   Title Patient will increase Left single leg stance time to 15 seconds or greater to increase safety in shower and independence with ADLs.    Baseline 10/31; 1.83 sec without UE support; 08/22/2021=2-3  sec  on left; 6 sec on right 4/20: RLE  10 seconds LLE unable to perform 6/6: R LE 4 seconds during BERG; 01/04/2022= left = 3 sec and right = 8 sec; 02/13/2022= 20 sec on right at best and 4 sec on left LE at best. 03/23/2022=Patient able to stand on left LE at best for 7 sec today. 04/03/2022= 27 sec on right and 12 sec on left; 06/12/2022= 13 sec on left  and 33 sec at best on right LE; 06/26/2021= 11 sec on left at best and 22 sec on right today; 07/26/2022= 10 sec on left and 20 sec on right today.; 09/04/22: 1-2 seconds on LLE. 27 sec on RLE. 12/04/2022= Will assess next visit   Time 12    Period Weeks    Status On-going    Target Date 12/18/2022     PT LONG TERM GOAL #7   Title Patient will safely negotiate 13 steps with handrails to safely enter/exit mothers house.    Baseline 4/20: very challenging 6/6: able to copmlete safely even managing dogs per pt report; 01/04/2022- Patient performed 16 steps with B Rail-no difficulty other than fatigue.    Time 12    Period Weeks    Status Achieved   Target Date 01/04/22    PT LONG TERM GOAL #8  Title Patient will increase six minute walk test distance to >1100 ft.  for progression to community ambulator and improve gait ability   Baseline 04/03/2022= 920 feet with 4WW - (Added another goal for endurance to continue to progress community amb distances); 05/03/2022= 960 feet with upright 4WW (patient's first time using device): 06/12/2022= 990 feet with 4WW; 06/26/2022= 1010 feet with 4WW; 07/26/2022= 1015 feet; 09/04/22: 1,020'; 09/25/2022= Will reassess next visit. 10/04/2022= 1085ft; 12/06/2022= 900 feet  Time 12   Period Weeks   Status ONGOING  Target Date 12/18/2022       PT LONG TERM GOAL #9  Title Patient will be able to perform  functional activities such as vacuuming or using backpack sprayer with modified independence for improved household chore abilities   Baseline 06/26/2022= Patient unable to perform either task but has strong desire to return to performing if able. 07/26/2022- Patient has  not attempted to vacuum yet and PT session focusing on LE strengthening.; 09/04/22: Able to perform vacuuming tasks mod-I. Has to break up tasks due to limited standing tolerance. Has not done outdoor tasks due to weather.  09/25/2022= Patient reports he was successful in vacuuming a few weeks ago- tiring but able to perform; has not tried spraying or weedeating yet; 4/30: commenced activity simulation in session today. Still unable to perform spraying or edging at home as desired. 12/04/2022= Will reassess next visit; 12/06/2022= Patient reports plans to try before next visit.   Time 12   Period Weeks   Status Ongoing  Target Date 12/18/2022   PT LONG TERM GOAL #10  Title Patient will increase glute medius strength on Left LE from able to lift off 7" (sidelye on mat) to > 10" to improve Hip stability with standing, gait mechanics, and functional strength.   Baseline 06/26/2022= 7in in sidelye position. 07/26/2022- 7.5 in; 09/04/22: 8"; 09/25/2022: deferred; 4/30: deferred ; 12/04/2022= Will assess next visit; 12/06/2022= 5"  Time 12   Period Weeks   Status ONGOING  Target Date 12/18/2022     Plan - 12/13/21 1340     Clinical Impression Statement Patient presents with improved left LE ability- intiially unable to raise left LE into march position from chair but able to raise from elevated mat table. He presented with no significant foot drag with LE despite increased resistance. Pt will continue to benefit from skilled PT services to address remaining deficits to maximize his functional independence with mobility and decrease fall risk.    Personal Factors and Comorbidities Age;Comorbidity 3+;Fitness;Past/Current Experience;Time since onset of injury/illness/exacerbation    Comorbidities aroxysmal a fib, MS, lymphedema, hypothyroidism, Graves Disease, arthritis, lumbar herniated disc (L5), depression and neuropathy    Examination-Activity Limitations Bed Mobility;Bend;Caring for Others;Carry;Reach  Overhead;Locomotion Level;Lift;Hygiene/Grooming;Dressing;Squat;Stairs;Stand;Toileting;Transfers    Examination-Participation Restrictions Cleaning;Community Activity;Laundry;Occupation;Shop;Volunteer;Yard Work    Conservation officer, historic buildings Evolving/Moderate complexity    Rehab Potential Fair    PT Frequency 1-2x / week    PT Duration 12 weeks    PT Treatment/Interventions ADLs/Self Care Home Management;Aquatic Therapy;Canalith Repostioning;Biofeedback;Cryotherapy;Ultrasound;Traction;Moist Heat;Electrical Stimulation;DME Instruction;Gait training;Therapeutic exercise;Therapeutic activities;Functional mobility training;Stair training;Balance training;Neuromuscular re-education;Patient/family education;Manual techniques;Orthotic Fit/Training;Compression bandaging;Passive range of motion;Vestibular;Taping;Splinting;Energy conservation;Dry needling;Visual/perceptual remediation/compensation    PT Next Visit Plan Continue with progessive balance training, Progressive LE strenthening as appropriate.    PT Home Exercise Plan Access Code: [email or web address] URL: [email or web address]  Added Bridges with BTB and encouraged more walking around the home during commerical breaks when watching TV.    Consulted and Agree with Plan of Care Patient            3:39 PM, 12/13/22  3:39 PM, 12/13/22 Louis Meckel, PT Physical Therapist - [facility]  Outpatient Physical Therapy- Main Campus [phone]

## 2022-12-14 ENCOUNTER — Telehealth: Payer: Self-pay | Admitting: Cardiology

## 2022-12-14 MED ORDER — DILTIAZEM HCL ER 120 MG PO CP12: 120.0000 mg | ORAL_CAPSULE | Freq: Two times a day (BID) | ORAL | 3 refills | Status: DC

## 2022-12-14 NOTE — Telephone Encounter (Signed)
Requested Prescriptions   Signed Prescriptions Disp Refills   diltiazem (CARDIZEM SR) 120 MG 12 hr capsule 180 capsule 3    Sig: Take 1 capsule (120 mg total) by mouth 2 (two) times daily.    Authorizing Provider: Debbe Odea    Ordering User: Kendrick Fries

## 2022-12-14 NOTE — Telephone Encounter (Signed)
*  STAT* If patient is at the pharmacy, call can be transferred to refill team.   1. Which medications need to be refilled? (please list name of each medication and dose if known)   diltiazem (CARDIZEM SR) 120 MG 12 hr capsule   2. Which pharmacy/location (including street and city if local pharmacy) is medication to be sent to?  [facility] [address] [place] - [address]   3. Do they need a 30 day or 90 day supply?   90 day  Patient stated he only has 2 days left of this medication.

## 2022-12-18 ENCOUNTER — Ambulatory Visit: Payer: Medicare PPO

## 2022-12-18 DIAGNOSIS — R2689 Other abnormalities of gait and mobility: Secondary | ICD-10-CM | POA: Diagnosis not present

## 2022-12-18 DIAGNOSIS — M6281 Muscle weakness (generalized): Secondary | ICD-10-CM

## 2022-12-18 DIAGNOSIS — R269 Unspecified abnormalities of gait and mobility: Secondary | ICD-10-CM

## 2022-12-18 DIAGNOSIS — R2681 Unsteadiness on feet: Secondary | ICD-10-CM | POA: Diagnosis not present

## 2022-12-18 DIAGNOSIS — R262 Difficulty in walking, not elsewhere classified: Secondary | ICD-10-CM

## 2022-12-18 DIAGNOSIS — R278 Other lack of coordination: Secondary | ICD-10-CM

## 2022-12-18 NOTE — Therapy (Signed)
OUTPATIENT PHYSICAL THERAPY TREATMENT   Patient Name: Dustin Gray MRN: [id number] DOB:[birth year], 67 y.o., male Today's Date: 12/19/2022  PCP: Blair Heys, MD REFERRING PROVIDER: Shirlean Schlein, MD   PT End of Session - 12/18/22 1156     Visit Number 143    Number of Visits 148    Date for PT Re-Evaluation 12/18/22    Authorization Type [employer] Auth 3/26-5/30/24- 15 visits    Authorization Time Period 4/2-6/25/24    Progress Note Due on Visit 150    PT Start Time 1154    PT Stop Time 1228    PT Time Calculation (min) 34 min    Equipment Utilized During Treatment Gait belt    Activity Tolerance Patient tolerated treatment well;No increased pain    Behavior During Therapy WFL for tasks assessed/performed                Past Medical History:  Diagnosis Date   Abscess    groin   Arthritis    lower spine   Erectile dysfunction    Low testosterone    Lumbar herniated disc    L5   Multiple sclerosis (HCC)    Staph aureus infection    Past Surgical History:  Procedure Laterality Date   CARDIOVERSION N/A 11/22/2021   Procedure: CARDIOVERSION;  Surgeon: Debbe Odea, MD;  Location: [facility];  Service: Cardiovascular;  Laterality: N/A;   COLONOSCOPY WITH PROPOFOL N/A 07/04/2016   Procedure: COLONOSCOPY WITH PROPOFOL;  Surgeon: Midge Minium, MD;  Location: [facility];  Service: Endoscopy;  Laterality: N/A;   FINGER SURGERY  1964   MASS EXCISION  1960   POLYPECTOMY  07/04/2016   Procedure: POLYPECTOMY;  Surgeon: Midge Minium, MD;  Location: [facility];  Service: Endoscopy;;   TONSILLECTOMY     Patient Active Problem List   Diagnosis Date Noted   Persistent atrial fibrillation (HCC)    Graves disease    Abdominal bloating    Atrial fibrillation with RVR (HCC) 10/01/2019   Hyperthyroidism    Atrial fibrillation with rapid ventricular response (HCC) 09/30/2019   Leg edema    Left leg cellulitis    Special screening for  malignant neoplasms, colon    Polyp of sigmoid colon    Benign neoplasm of ascending colon    Rectal polyp    Herniated nucleus pulposus, L5-S1 11/09/2015   Low back pain 10/25/2015   Herniated nucleus pulposus, C5-6 right 10/06/2015   Dysesthesia 09/16/2015   Spasticity 09/16/2015   Unsteady gait 09/16/2015   Multiple sclerosis (HCC) 06/08/2011     REFERRING DIAG: R26.81 (ICD-10-CM) - Unsteadiness on feet   THERAPY DIAG:  Unsteadiness on feet  Difficulty in walking, not elsewhere classified  Abnormality of gait and mobility  Muscle weakness (generalized)  Rationale for Evaluation and Treatment Rehabilitation  PERTINENT HISTORY: Patient presents to physical therapy for unsteadiness, walking instability, fatigue. His PMH includes paroxysmal a fib, MS, lymphedema, hypothyroidism, Graves Disease, arthritis, lumbar herniated disc (L5), depression and neuropathy. Patient first developed symptoms of MS in 2005 and was diagnosed in 2006. Patient has done PT in the past, but hasn't been seen since 2020 at [facility]. Has not been doing his exercises in the past year. Walk outside to garage to ride lawnmower, walks in house. Retired Optician, dispensing. Has a rollator at home but doesn't use it in the house   PRECAUTIONS: Fall  SUBJECTIVE: Patient reports dealing with some ongoing groin soreness.    Are you having  pain? Groin soreness- Did not rate today.    TODAY'S TREATMENT: 12/18/2022     THEREX:   Gait in clinic 4WW 300 feet focusing on erect posture and no dragging of left LE- 2 instances of left foot drag yet no LOB Step over 1/2 foam (forward/backward) x 20 reps each LE - Some hyperextention with left knee (less with increased concentration)  Standing hip circles (around vertically placed small 1/2 foam) x 20 reps - CW and 20 CCW each LE (decreased coordination with left LE vs. Right)  Standing hip ext 2 sets of 10 reps each LE (VC for technique)  Standing donkey kick with  left foot on stool 2 sets of 10 reps and no stool on right side 2 sets of 10 reps Sit to stand from airex pad on chair x 15 reps without UE support Standing hip abd +ER x 10 reps each LE  *Patient reported fatigue at end of session.   Marland Kitchen         PATIENT EDUCATION: Education details: exercise technique, body mechanics Person educated: Patient Education method: Medical illustrator, verbal cues Education comprehension: verbalized understanding, returned demonstration    HOME EXERCISE PROGRAM: No changes this session   PT Short Term Goals -       PT SHORT TERM GOAL #1   Title Patient will be independent in home exercise program to improve strength/mobility for better functional independence with ADLs.    Baseline 8/9: HEP to be given next session, 10/31: patient reports compliance with HEP and would like progressions added next session. 08/22/2021=Patient verbalized that he is walking and doing his LE exercises with no questions at this time.    Time 4    Period Weeks    Status Achieved    Target Date 08/16/21              PT Long Term Goals       PT LONG TERM GOAL #1   Title Patient will increase FOTO score to equal to or greater than 70%  to demonstrate statistically significant improvement in mobility and quality of life.    Baseline 8/9: 64%; risk adjusted 34%; 03/21/21 FOTO: 45; 04/24/21 FOTO: 49% ; 08/22/2021= 56% 4/20: 60% 11/28/21: 63%; 01/04/2022= 60%. 03/22/2022= 73%; 10/23/22: 68   Time 12    Period Weeks    Status Achieved   Target Date 06/26/2022     PT LONG TERM GOAL #2   Title Patient will increase Berg Balance score by > 6 points to demonstrate decreased fall risk during functional activities.    Baseline 4/20: 43/56 6/6: 48/56; 713/2023= 50/56   Time 12    Period Weeks    Status Achieved   Target Date 03/29/2022     PT LONG TERM GOAL #3   Title Patient will increase 10 meter walk test to >1.78m/s as to improve gait speed for better community  ambulation and to reduce fall risk.    Baseline 8/9: 0.65 m/s with rollator; 10/31: 0.68 m/s with rollator. 07/06/2021= 0.71 m/s using rollator; 08/22/2021= 0.94 m/s using rollator. 10/12/21: 1.1 m/s with rollator; 10/23/22: 0.28m/s    Time 12    Period Weeks    Status Achieved; maintaining gains    Target Date 10/11/21      PT LONG TERM GOAL #4   Title Patient will increase six minute walk test distance to >1000 for progression to community ambulator and improve gait ability    Baseline 8/9: 505 ft with rollator;  03/21/21: 723ft c rollator, 04/24/21: 753ft c rollator; 07/06/2021=900 feet; 08/22/2021= 935 feet with use of Rollator 44/20: 940 ft 11/28/21: 1055 feet with rollator   Time 12    Period Weeks    Status Achieved    Target Date 01/04/22      PT LONG TERM GOAL #5   Title Patient will increase glute medius strength on Left LE from able to lift off 3" (sidelye on mat) to > 5" to improve stability, gait mechanics, and functional strength.    Baseline 10/31: 3-/5; 07/06/2021= 3-/5; 08/22/2021=3-/5 unable to achieve full ROM yet able to withstand some resistance with testing 4/20: unable to obtainfull ROM against gravity 6/6: unable to move against gravity; 01/04/2022= Continues to have difficulty - unable to raise left LE in sidelye against gravity but able to perform supine left Hip abd with some resistance= 3-/5; 03/22/2022- Patient able to raise his left LE 3 in off mat today. 04/03/2022= Patient able to raise left LE 3" off mat in sidelye today; 05/01/2022= 05/01/2022= patient able to raise left LE from sidelye position= 5"; 06/12/2022- Patient presents with 6.25 in sidelye Left hip abd against gravity. 06/26/2022= 7in in sidelye position.    Time 12    Period Weeks    Status MET   Target Date 06/26/2022     PT LONG TERM GOAL #6   Title Patient will increase Left single leg stance time to 15 seconds or greater to increase safety in shower and independence with ADLs.    Baseline 10/31; 1.83 sec without  UE support; 08/22/2021=2-3  sec  on left; 6 sec on right 4/20: RLE 10 seconds LLE unable to perform 6/6: R LE 4 seconds during BERG; 01/04/2022= left = 3 sec and right = 8 sec; 02/13/2022= 20 sec on right at best and 4 sec on left LE at best. 03/23/2022=Patient able to stand on left LE at best for 7 sec today. 04/03/2022= 27 sec on right and 12 sec on left; 06/12/2022= 13 sec on left  and 33 sec at best on right LE; 06/26/2021= 11 sec on left at best and 22 sec on right today; 07/26/2022= 10 sec on left and 20 sec on right today.; 09/04/22: 1-2 seconds on LLE. 27 sec on RLE. 12/04/2022= Will assess next visit   Time 12    Period Weeks    Status On-going    Target Date 12/18/2022     PT LONG TERM GOAL #7   Title Patient will safely negotiate 13 steps with handrails to safely enter/exit mothers house.    Baseline 4/20: very challenging 6/6: able to copmlete safely even managing dogs per pt report; 01/04/2022- Patient performed 16 steps with B Rail-no difficulty other than fatigue.    Time 12    Period Weeks    Status Achieved   Target Date 01/04/22    PT LONG TERM GOAL #8  Title Patient will increase six minute walk test distance to >1100 ft.  for progression to community ambulator and improve gait ability   Baseline 04/03/2022= 920 feet with 4WW - (Added another goal for endurance to continue to progress community amb distances); 05/03/2022= 960 feet with upright 4WW (patient's first time using device): 06/12/2022= 990 feet with 4WW; 06/26/2022= 1010 feet with 4WW; 07/26/2022= 1015 feet; 09/04/22: 1,020'; 09/25/2022= Will reassess next visit. 10/04/2022= 1016ft; 12/06/2022= 900 feet  Time 12   Period Weeks   Status ONGOING  Target Date 12/18/2022       PT LONG  TERM GOAL #9  Title Patient will be able to perform functional activities such as vacuuming or using backpack sprayer with modified independence for improved household chore abilities   Baseline 06/26/2022= Patient unable to perform either task but has  strong desire to return to performing if able. 07/26/2022- Patient has not attempted to vacuum yet and PT session focusing on LE strengthening.; 09/04/22: Able to perform vacuuming tasks mod-I. Has to break up tasks due to limited standing tolerance. Has not done outdoor tasks due to weather.  09/25/2022= Patient reports he was successful in vacuuming a few weeks ago- tiring but able to perform; has not tried spraying or weedeating yet; 4/30: commenced activity simulation in session today. Still unable to perform spraying or edging at home as desired. 12/04/2022= Will reassess next visit; 12/06/2022= Patient reports plans to try before next visit.   Time 12   Period Weeks   Status Ongoing  Target Date 12/18/2022   PT LONG TERM GOAL #10  Title Patient will increase glute medius strength on Left LE from able to lift off 7" (sidelye on mat) to > 10" to improve Hip stability with standing, gait mechanics, and functional strength.   Baseline 06/26/2022= 7in in sidelye position. 07/26/2022- 7.5 in; 09/04/22: 8"; 09/25/2022: deferred; 4/30: deferred ; 12/04/2022= Will assess next visit; 12/06/2022= 5"  Time 12   Period Weeks   Status ONGOING  Target Date 12/18/2022     Plan - 12/13/21 1340     Clinical Impression Statement Patient presents with excellent motivation for today's session. He demonstrated improving Left foot swing with minimal dragging. He was able to achieve more hip ext with use of stool today.  Pt will continue to benefit from skilled PT services to address remaining deficits to maximize his functional independence with mobility and decrease fall risk.    Personal Factors and Comorbidities Age;Comorbidity 3+;Fitness;Past/Current Experience;Time since onset of injury/illness/exacerbation    Comorbidities aroxysmal a fib, MS, lymphedema, hypothyroidism, Graves Disease, arthritis, lumbar herniated disc (L5), depression and neuropathy    Examination-Activity Limitations Bed Mobility;Bend;Caring for  Others;Carry;Reach Overhead;Locomotion Level;Lift;Hygiene/Grooming;Dressing;Squat;Stairs;Stand;Toileting;Transfers    Examination-Participation Restrictions Cleaning;Community Activity;Laundry;Occupation;Shop;Volunteer;Yard Work    Conservation officer, historic buildings Evolving/Moderate complexity    Rehab Potential Fair    PT Frequency 1-2x / week    PT Duration 12 weeks    PT Treatment/Interventions ADLs/Self Care Home Management;Aquatic Therapy;Canalith Repostioning;Biofeedback;Cryotherapy;Ultrasound;Traction;Moist Heat;Electrical Stimulation;DME Instruction;Gait training;Therapeutic exercise;Therapeutic activities;Functional mobility training;Stair training;Balance training;Neuromuscular re-education;Patient/family education;Manual techniques;Orthotic Fit/Training;Compression bandaging;Passive range of motion;Vestibular;Taping;Splinting;Energy conservation;Dry needling;Visual/perceptual remediation/compensation    PT Next Visit Plan Continue with progessive balance training, Progressive LE strenthening as appropriate.    PT Home Exercise Plan Access Code: [email or web address] URL: [email or web address]  Added Bridges with BTB and encouraged more walking around the home during commerical breaks when watching TV.    Consulted and Agree with Plan of Care Patient            3:52 PM, 12/19/22  3:52 PM, 12/19/22 Louis Meckel, PT Physical Therapist - [facility]  Outpatient Physical Therapy- Main Campus [phone]

## 2022-12-20 ENCOUNTER — Ambulatory Visit: Payer: Medicare PPO

## 2022-12-20 DIAGNOSIS — R2681 Unsteadiness on feet: Secondary | ICD-10-CM | POA: Diagnosis not present

## 2022-12-20 DIAGNOSIS — M6281 Muscle weakness (generalized): Secondary | ICD-10-CM

## 2022-12-20 DIAGNOSIS — R269 Unspecified abnormalities of gait and mobility: Secondary | ICD-10-CM

## 2022-12-20 DIAGNOSIS — R2689 Other abnormalities of gait and mobility: Secondary | ICD-10-CM

## 2022-12-20 DIAGNOSIS — R262 Difficulty in walking, not elsewhere classified: Secondary | ICD-10-CM

## 2022-12-20 DIAGNOSIS — R278 Other lack of coordination: Secondary | ICD-10-CM

## 2022-12-20 NOTE — Therapy (Signed)
OUTPATIENT PHYSICAL THERAPY TREATMENT/RECERT   Patient Name: Dustin Gray MRN: [id number] DOB:1956-02-21, 67 y.o., male Today's Date: 12/25/2022  PCP: Blair Heys, MD REFERRING PROVIDER: Shirlean Schlein, MD    PT End of Session - 12/25/22 1435     Visit Number 144   Number of Visits 168    Date for PT Re-Evaluation 03/14/23    Authorization Type [employer] Auth 3/26-5/30/24- 15 visits    Authorization Time Period 4/2-6/25/24    Progress Note Due on Visit 150    PT Start Time 1433   PT Stop Time 1514   Equipment Utilized During Treatment Gait belt    Activity Tolerance Patient tolerated treatment well;No increased pain    Behavior During Therapy WFL for tasks assessed/performed                 Past Medical History:  Diagnosis Date   Abscess    groin   Arthritis    lower spine   Erectile dysfunction    Low testosterone    Lumbar herniated disc    L5   Multiple sclerosis (HCC)    Staph aureus infection    Past Surgical History:  Procedure Laterality Date   CARDIOVERSION N/A 11/22/2021   Procedure: CARDIOVERSION;  Surgeon: Debbe Odea, MD;  Location: [facility];  Service: Cardiovascular;  Laterality: N/A;   COLONOSCOPY WITH PROPOFOL N/A 07/04/2016   Procedure: COLONOSCOPY WITH PROPOFOL;  Surgeon: Midge Minium, MD;  Location: [facility];  Service: Endoscopy;  Laterality: N/A;   FINGER SURGERY  1964   MASS EXCISION  1960   POLYPECTOMY  07/04/2016   Procedure: POLYPECTOMY;  Surgeon: Midge Minium, MD;  Location: [facility];  Service: Endoscopy;;   TONSILLECTOMY     Patient Active Problem List   Diagnosis Date Noted   Persistent atrial fibrillation (HCC)    Graves disease    Abdominal bloating    Atrial fibrillation with RVR (HCC) 10/01/2019   Hyperthyroidism    Atrial fibrillation with rapid ventricular response (HCC) 09/30/2019   Leg edema    Left leg cellulitis    Special screening for malignant neoplasms, colon    Polyp  of sigmoid colon    Benign neoplasm of ascending colon    Rectal polyp    Herniated nucleus pulposus, L5-S1 11/09/2015   Low back pain 10/25/2015   Herniated nucleus pulposus, C5-6 right 10/06/2015   Dysesthesia 09/16/2015   Spasticity 09/16/2015   Unsteady gait 09/16/2015   Multiple sclerosis (HCC) 06/08/2011     REFERRING DIAG: R26.81 (ICD-10-CM) - Unsteadiness on feet   THERAPY DIAG:  Unsteadiness on feet  Difficulty in walking, not elsewhere classified  Abnormality of gait and mobility  Muscle weakness (generalized)  Rationale for Evaluation and Treatment Rehabilitation  PERTINENT HISTORY: Patient presents to physical therapy for unsteadiness, walking instability, fatigue. His PMH includes paroxysmal a fib, MS, lymphedema, hypothyroidism, Graves Disease, arthritis, lumbar herniated disc (L5), depression and neuropathy. Patient first developed symptoms of MS in 2005 and was diagnosed in 2006. Patient has done PT in the past, but hasn't been seen since 2020 at [facility]. Has not been doing his exercises in the past year. Walk outside to garage to ride lawnmower, walks in house. Retired Optician, dispensing. Has a rollator at home but doesn't use it in the house   PRECAUTIONS: Fall  SUBJECTIVE: Patient reports feeling pretty good today- no specific pain or issues.     Are you having pain? No   TODAY'S TREATMENT:  12/20/2022     THEREX:    Gait in clinic 4WW 300 feet focusing on erect posture and no dragging of left LE- 2 instances of left foot drag yet no LOB Step over 1/2 foams x3 (forward) in //bars x 6  (some left foot drag)   Step over 1/2 foams x3 (side step) in //bars x 6  (more difficulty moving to left side Step up/over  1/2 foam and perform lunge x 15 reps each LE.  Standing hip ext 2 sets of 10 reps each LE (VC for technique)  Sit to stand from airex pad on chair 2 x 15 reps without UE support Standing hip abd +ER x 10 reps each LE with UE support on high mat  table  Patient ambulated with 4WW at end of session- no dragging of left LE observed.   Physical therapy treatment session today consisted of completing assessment of goals and administration of testing as demonstrated and documented in flow sheet, treatment, and goals section of this note. Addition treatments may be found below.       PATIENT EDUCATION: Education details: exercise technique, body mechanics Person educated: Patient Education method: Medical illustrator, verbal cues Education comprehension: verbalized understanding, returned demonstration    HOME EXERCISE PROGRAM: No changes this session   PT Short Term Goals -       PT SHORT TERM GOAL #1   Title Patient will be independent in home exercise program to improve strength/mobility for better functional independence with ADLs.    Baseline 8/9: HEP to be given next session, 10/31: patient reports compliance with HEP and would like progressions added next session. 08/22/2021=Patient verbalized that he is walking and doing his LE exercises with no questions at this time.    Time 4    Period Weeks    Status Achieved    Target Date 08/16/21              PT Long Term Goals       PT LONG TERM GOAL #1   Title Patient will increase FOTO score to equal to or greater than 70%  to demonstrate statistically significant improvement in mobility and quality of life.    Baseline 8/9: 64%; risk adjusted 34%; 03/21/21 FOTO: 45; 04/24/21 FOTO: 49% ; 08/22/2021= 56% 4/20: 60% 11/28/21: 63%; 01/04/2022= 60%. 03/22/2022= 73%; 10/23/22: 68   Time 12    Period Weeks    Status Achieved   Target Date 06/26/2022     PT LONG TERM GOAL #2   Title Patient will increase Berg Balance score by > 6 points to demonstrate decreased fall risk during functional activities.    Baseline 4/20: 43/56 6/6: 48/56; 713/2023= 50/56   Time 12    Period Weeks    Status Achieved   Target Date 03/29/2022     PT LONG TERM GOAL #3   Title Patient  will increase 10 meter walk test to >1.21m/s as to improve gait speed for better community ambulation and to reduce fall risk.    Baseline 8/9: 0.65 m/s with rollator; 10/31: 0.68 m/s with rollator. 07/06/2021= 0.71 m/s using rollator; 08/22/2021= 0.94 m/s using rollator. 10/12/21: 1.1 m/s with rollator; 10/23/22: 0.38m/s    Time 12    Period Weeks    Status Achieved; maintaining gains    Target Date 10/11/21      PT LONG TERM GOAL #4   Title Patient will increase six minute walk test distance to >1000 for progression to community ambulator  and improve gait ability    Baseline 8/9: 505 ft with rollator; 03/21/21: 750ft c rollator, 04/24/21: 741ft c rollator; 07/06/2021=900 feet; 08/22/2021= 935 feet with use of Rollator 44/20: 940 ft 11/28/21: 1055 feet with rollator   Time 12    Period Weeks    Status Achieved    Target Date 01/04/22      PT LONG TERM GOAL #5   Title Patient will increase glute medius strength on Left LE from able to lift off 3" (sidelye on mat) to > 5" to improve stability, gait mechanics, and functional strength.    Baseline 10/31: 3-/5; 07/06/2021= 3-/5; 08/22/2021=3-/5 unable to achieve full ROM yet able to withstand some resistance with testing 4/20: unable to obtainfull ROM against gravity 6/6: unable to move against gravity; 01/04/2022= Continues to have difficulty - unable to raise left LE in sidelye against gravity but able to perform supine left Hip abd with some resistance= 3-/5; 03/22/2022- Patient able to raise his left LE 3 in off mat today. 04/03/2022= Patient able to raise left LE 3" off mat in sidelye today; 05/01/2022= 05/01/2022= patient able to raise left LE from sidelye position= 5"; 06/12/2022- Patient presents with 6.25 in sidelye Left hip abd against gravity. 06/26/2022= 7in in sidelye position.    Time 12    Period Weeks    Status MET   Target Date 06/26/2022     PT LONG TERM GOAL #6   Title Patient will increase Left single leg stance time to 15 seconds or greater to  increase safety in shower and independence with ADLs.    Baseline 10/31; 1.83 sec without UE support; 08/22/2021=2-3  sec  on left; 6 sec on right 4/20: RLE 10 seconds LLE unable to perform 6/6: R LE 4 seconds during BERG; 01/04/2022= left = 3 sec and right = 8 sec; 02/13/2022= 20 sec on right at best and 4 sec on left LE at best. 03/23/2022=Patient able to stand on left LE at best for 7 sec today. 04/03/2022= 27 sec on right and 12 sec on left; 06/12/2022= 13 sec on left  and 33 sec at best on right LE; 06/26/2021= 11 sec on left at best and 22 sec on right today; 07/26/2022= 10 sec on left and 20 sec on right today.; 09/04/22: 1-2 seconds on LLE. 27 sec on RLE. 12/04/2022= Will assess next visit; 12/20/2022- Will assess next 1-2 visits   Time 12    Period Weeks    Status On-going    Target Date 12/18/2022     PT LONG TERM GOAL #7   Title Patient will safely negotiate 13 steps with handrails to safely enter/exit mothers house.    Baseline 4/20: very challenging 6/6: able to copmlete safely even managing dogs per pt report; 01/04/2022- Patient performed 16 steps with B Rail-no difficulty other than fatigue.    Time 12    Period Weeks    Status Achieved   Target Date 01/04/22    PT LONG TERM GOAL #8  Title Patient will increase six minute walk test distance to >1100 ft.  for progression to community ambulator and improve gait ability   Baseline 04/03/2022= 920 feet with 4WW - (Added another goal for endurance to continue to progress community amb distances); 05/03/2022= 960 feet with upright 4WW (patient's first time using device): 06/12/2022= 990 feet with 4WW; 06/26/2022= 1010 feet with 4WW; 07/26/2022= 1015 feet; 09/04/22: 1,020'; 09/25/2022= Will reassess next visit. 10/04/2022= 1052ft; 12/06/2022= 900 feet  Time 12  Period Weeks   Status ONGOING  Target Date 03/14/2023       PT LONG TERM GOAL #9  Title Patient will be able to perform functional activities such as vacuuming or using backpack sprayer with  modified independence for improved household chore abilities   Baseline 06/26/2022= Patient unable to perform either task but has strong desire to return to performing if able. 07/26/2022- Patient has not attempted to vacuum yet and PT session focusing on LE strengthening.; 09/04/22: Able to perform vacuuming tasks mod-I. Has to break up tasks due to limited standing tolerance. Has not done outdoor tasks due to weather.  09/25/2022= Patient reports he was successful in vacuuming a few weeks ago- tiring but able to perform; has not tried spraying or weedeating yet; 4/30: commenced activity simulation in session today. Still unable to perform spraying or edging at home as desired. 12/04/2022= Will reassess next visit; 12/06/2022= Patient reports plans to try before next visit. 12/20/2022- Patient continues to report has not attempted due to recent increased complaint of hip weakness and since his wife has been back home- he has not attempted but reports still a goal of his in the future  Time 12   Period Weeks   Status Ongoing  Target Date 03/14/2023   PT LONG TERM GOAL #10  Title Patient will increase glute medius strength on Left LE from able to lift off 7" (sidelye on mat) to > 10" to improve Hip stability with standing, gait mechanics, and functional strength.   Baseline 06/26/2022= 7in in sidelye position. 07/26/2022- 7.5 in; 09/04/22: 8"; 09/25/2022: deferred; 4/30: deferred ; 12/04/2022= Will assess next visit; 12/06/2022= 5"  Time 12   Period Weeks   Status ONGOING  Target Date 12/18/2022     Plan - 12/13/21 1340     Clinical Impression Statement Patient presents with excellent motivation for today's session. He worked hard on activities designed to challenge him with using his left leg with mobility and performed well- continues to demo fatigue from activity as largest concern but participates well and does his best to use his Left left with some compensatory strategies. Did not address all goals for recert due  to recent completion of 6 min walk test on 12/06/2022 and recent Left hip weakness- will remeasure next 1-2 visits and assess any changes to goals.  Will continue with focus on proximal LE strength to maximize his functional potential with standing, walking and community outings.   Pt will continue to benefit from skilled PT services to address remaining deficits to maximize his functional independence with mobility and decrease fall risk.    Personal Factors and Comorbidities Age;Comorbidity 3+;Fitness;Past/Current Experience;Time since onset of injury/illness/exacerbation    Comorbidities aroxysmal a fib, MS, lymphedema, hypothyroidism, Graves Disease, arthritis, lumbar herniated disc (L5), depression and neuropathy    Examination-Activity Limitations Bed Mobility;Bend;Caring for Others;Carry;Reach Overhead;Locomotion Level;Lift;Hygiene/Grooming;Dressing;Squat;Stairs;Stand;Toileting;Transfers    Examination-Participation Restrictions Cleaning;Community Activity;Laundry;Occupation;Shop;Volunteer;Yard Work    Conservation officer, historic buildings Evolving/Moderate complexity    Rehab Potential Fair    PT Frequency 1-2x / week    PT Duration 12 weeks    PT Treatment/Interventions ADLs/Self Care Home Management;Aquatic Therapy;Canalith Repostioning;Biofeedback;Cryotherapy;Ultrasound;Traction;Moist Heat;Electrical Stimulation;DME Instruction;Gait training;Therapeutic exercise;Therapeutic activities;Functional mobility training;Stair training;Balance training;Neuromuscular re-education;Patient/family education;Manual techniques;Orthotic Fit/Training;Compression bandaging;Passive range of motion;Vestibular;Taping;Splinting;Energy conservation;Dry needling;Visual/perceptual remediation/compensation    PT Next Visit Plan Continue with progessive balance training, Progressive LE strenthening as appropriate.    PT Home Exercise Plan Access Code: [email or web address] URL: [email or web address]  Added Bridges with BTB  and encouraged more walking around  the home during commerical breaks when watching TV.    Consulted and Agree with Plan of Care Patient            3:21 PM, 12/25/22  3:21 PM, 12/25/22 Louis Meckel, PT Physical Therapist - [place] [facility] [facility]  Outpatient Physical Therapy- Main Campus [phone]

## 2022-12-25 ENCOUNTER — Ambulatory Visit: Payer: Medicare PPO | Attending: Neurology

## 2022-12-25 DIAGNOSIS — R269 Unspecified abnormalities of gait and mobility: Secondary | ICD-10-CM | POA: Insufficient documentation

## 2022-12-25 DIAGNOSIS — M6281 Muscle weakness (generalized): Secondary | ICD-10-CM | POA: Insufficient documentation

## 2022-12-25 DIAGNOSIS — R2681 Unsteadiness on feet: Secondary | ICD-10-CM | POA: Insufficient documentation

## 2022-12-25 DIAGNOSIS — R262 Difficulty in walking, not elsewhere classified: Secondary | ICD-10-CM | POA: Diagnosis not present

## 2022-12-25 DIAGNOSIS — R2689 Other abnormalities of gait and mobility: Secondary | ICD-10-CM | POA: Insufficient documentation

## 2022-12-25 DIAGNOSIS — R278 Other lack of coordination: Secondary | ICD-10-CM | POA: Insufficient documentation

## 2022-12-25 NOTE — Addendum Note (Signed)
Addended by: Lenda Kelp on: 12/25/2022 03:31 PM   Modules accepted: Orders

## 2022-12-25 NOTE — Therapy (Signed)
OUTPATIENT PHYSICAL THERAPY TREATMENT   Patient Name: Dustin Gray MRN: [id number] DOB:04-22-1956, 67 y.o., male Today's Date: 12/25/2022  PCP: Blair Heys, MD REFERRING PROVIDER: Shirlean Schlein, MD   PT End of Session - 12/25/22 1435     Visit Number 145    Number of Visits 168    Date for PT Re-Evaluation 03/14/23    Authorization Type [employer] Auth 3/26-5/30/24- 15 visits    Authorization Time Period 4/2-6/25/24    Progress Note Due on Visit 150    PT Start Time 1430    PT Stop Time 1514    PT Time Calculation (min) 44 min    Equipment Utilized During Treatment Gait belt    Activity Tolerance Patient tolerated treatment well;No increased pain    Behavior During Therapy WFL for tasks assessed/performed                Past Medical History:  Diagnosis Date   Abscess    groin   Arthritis    lower spine   Erectile dysfunction    Low testosterone    Lumbar herniated disc    L5   Multiple sclerosis (HCC)    Staph aureus infection    Past Surgical History:  Procedure Laterality Date   CARDIOVERSION N/A 11/22/2021   Procedure: CARDIOVERSION;  Surgeon: Debbe Odea, MD;  Location: [facility];  Service: Cardiovascular;  Laterality: N/A;   COLONOSCOPY WITH PROPOFOL N/A 07/04/2016   Procedure: COLONOSCOPY WITH PROPOFOL;  Surgeon: Midge Minium, MD;  Location: [facility];  Service: Endoscopy;  Laterality: N/A;   FINGER SURGERY  1964   MASS EXCISION  1960   POLYPECTOMY  07/04/2016   Procedure: POLYPECTOMY;  Surgeon: Midge Minium, MD;  Location: [facility];  Service: Endoscopy;;   TONSILLECTOMY     Patient Active Problem List   Diagnosis Date Noted   Persistent atrial fibrillation (HCC)    Graves disease    Abdominal bloating    Atrial fibrillation with RVR (HCC) 10/01/2019   Hyperthyroidism    Atrial fibrillation with rapid ventricular response (HCC) 09/30/2019   Leg edema    Left leg cellulitis    Special screening for  malignant neoplasms, colon    Polyp of sigmoid colon    Benign neoplasm of ascending colon    Rectal polyp    Herniated nucleus pulposus, L5-S1 11/09/2015   Low back pain 10/25/2015   Herniated nucleus pulposus, C5-6 right 10/06/2015   Dysesthesia 09/16/2015   Spasticity 09/16/2015   Unsteady gait 09/16/2015   Multiple sclerosis (HCC) 06/08/2011     REFERRING DIAG: R26.81 (ICD-10-CM) - Unsteadiness on feet   THERAPY DIAG:  Unsteadiness on feet  Difficulty in walking, not elsewhere classified  Abnormality of gait and mobility  Muscle weakness (generalized)  Rationale for Evaluation and Treatment Rehabilitation  PERTINENT HISTORY: Patient presents to physical therapy for unsteadiness, walking instability, fatigue. His PMH includes paroxysmal a fib, MS, lymphedema, hypothyroidism, Graves Disease, arthritis, lumbar herniated disc (L5), depression and neuropathy. Patient first developed symptoms of MS in 2005 and was diagnosed in 2006. Patient has done PT in the past, but hasn't been seen since 2020 at [facility]. Has not been doing his exercises in the past year. Walk outside to garage to ride lawnmower, walks in house. Retired Optician, dispensing. Has a rollator at home but doesn't use it in the house   PRECAUTIONS: Fall  SUBJECTIVE: Patient reports left hip feeling better overall  Are you having pain? No  TODAY'S TREATMENT: 12/25/22   THEREX:   Sidelye Hip abd 2 x 10 reps on left LE (able to achieve > 6" each rep)  Sidelye hip ER (Clamshell) 2 x 12 reps each LE Supine SLR + Hip add/abd (up/over cone on mat) 2 sets of 12 reps each LE Supine heel slide AROM on left LE 2 sets of 12 reps Supine Hip add (ball squeeze)+bridge 2 set x 12 reps Gait in clinic 4WW 300 feet focusing on erect posture and 2 instances of left foot drag yet no LOB         PATIENT EDUCATION: Education details: exercise technique, body mechanics Person educated: Patient Education method:  Medical illustrator, verbal cues Education comprehension: verbalized understanding, returned demonstration    HOME EXERCISE PROGRAM: No changes this session   PT Short Term Goals -       PT SHORT TERM GOAL #1   Title Patient will be independent in home exercise program to improve strength/mobility for better functional independence with ADLs.    Baseline 8/9: HEP to be given next session, 10/31: patient reports compliance with HEP and would like progressions added next session. 08/22/2021=Patient verbalized that he is walking and doing his LE exercises with no questions at this time.    Time 4    Period Weeks    Status Achieved    Target Date 08/16/21              PT Long Term Goals       PT LONG TERM GOAL #1   Title Patient will increase FOTO score to equal to or greater than 70%  to demonstrate statistically significant improvement in mobility and quality of life.    Baseline 8/9: 64%; risk adjusted 34%; 03/21/21 FOTO: 45; 04/24/21 FOTO: 49% ; 08/22/2021= 56% 4/20: 60% 11/28/21: 63%; 01/04/2022= 60%. 03/22/2022= 73%; 10/23/22: 68   Time 12    Period Weeks    Status Achieved   Target Date 06/26/2022     PT LONG TERM GOAL #2   Title Patient will increase Berg Balance score by > 6 points to demonstrate decreased fall risk during functional activities.    Baseline 4/20: 43/56 6/6: 48/56; 713/2023= 50/56   Time 12    Period Weeks    Status Achieved   Target Date 03/29/2022     PT LONG TERM GOAL #3   Title Patient will increase 10 meter walk test to >1.45m/s as to improve gait speed for better community ambulation and to reduce fall risk.    Baseline 8/9: 0.65 m/s with rollator; 10/31: 0.68 m/s with rollator. 07/06/2021= 0.71 m/s using rollator; 08/22/2021= 0.94 m/s using rollator. 10/12/21: 1.1 m/s with rollator; 10/23/22: 0.75m/s    Time 12    Period Weeks    Status Achieved; maintaining gains    Target Date 10/11/21      PT LONG TERM GOAL #4   Title Patient will  increase six minute walk test distance to >1000 for progression to community ambulator and improve gait ability    Baseline 8/9: 505 ft with rollator; 03/21/21: 755ft c rollator, 04/24/21: 733ft c rollator; 07/06/2021=900 feet; 08/22/2021= 935 feet with use of Rollator 44/20: 940 ft 11/28/21: 1055 feet with rollator   Time 12    Period Weeks    Status Achieved    Target Date 01/04/22      PT LONG TERM GOAL #5   Title Patient will increase glute medius strength on Left LE from able to lift  off 3" (sidelye on mat) to > 5" to improve stability, gait mechanics, and functional strength.    Baseline 10/31: 3-/5; 07/06/2021= 3-/5; 08/22/2021=3-/5 unable to achieve full ROM yet able to withstand some resistance with testing 4/20: unable to obtainfull ROM against gravity 6/6: unable to move against gravity; 01/04/2022= Continues to have difficulty - unable to raise left LE in sidelye against gravity but able to perform supine left Hip abd with some resistance= 3-/5; 03/22/2022- Patient able to raise his left LE 3 in off mat today. 04/03/2022= Patient able to raise left LE 3" off mat in sidelye today; 05/01/2022= 05/01/2022= patient able to raise left LE from sidelye position= 5"; 06/12/2022- Patient presents with 6.25 in sidelye Left hip abd against gravity. 06/26/2022= 7in in sidelye position.    Time 12    Period Weeks    Status MET   Target Date 06/26/2022     PT LONG TERM GOAL #6   Title Patient will increase Left single leg stance time to 15 seconds or greater to increase safety in shower and independence with ADLs.    Baseline 10/31; 1.83 sec without UE support; 08/22/2021=2-3  sec  on left; 6 sec on right 4/20: RLE 10 seconds LLE unable to perform 6/6: R LE 4 seconds during BERG; 01/04/2022= left = 3 sec and right = 8 sec; 02/13/2022= 20 sec on right at best and 4 sec on left LE at best. 03/23/2022=Patient able to stand on left LE at best for 7 sec today. 04/03/2022= 27 sec on right and 12 sec on left; 06/12/2022= 13  sec on left  and 33 sec at best on right LE; 06/26/2021= 11 sec on left at best and 22 sec on right today; 07/26/2022= 10 sec on left and 20 sec on right today.; 09/04/22: 1-2 seconds on LLE. 27 sec on RLE. 12/04/2022= Will assess next visit   Time 12    Period Weeks    Status On-going    Target Date 03/14/2023     PT LONG TERM GOAL #7   Title Patient will safely negotiate 13 steps with handrails to safely enter/exit mothers house.    Baseline 4/20: very challenging 6/6: able to copmlete safely even managing dogs per pt report; 01/04/2022- Patient performed 16 steps with B Rail-no difficulty other than fatigue.    Time 12    Period Weeks    Status Achieved   Target Date 01/04/22    PT LONG TERM GOAL #8  Title Patient will increase six minute walk test distance to >1100 ft.  for progression to community ambulator and improve gait ability   Baseline 04/03/2022= 920 feet with 4WW - (Added another goal for endurance to continue to progress community amb distances); 05/03/2022= 960 feet with upright 4WW (patient's first time using device): 06/12/2022= 990 feet with 4WW; 06/26/2022= 1010 feet with 4WW; 07/26/2022= 1015 feet; 09/04/22: 1,020'; 09/25/2022= Will reassess next visit. 10/04/2022= 104ft; 12/06/2022= 900 feet  Time 12   Period Weeks   Status ONGOING  Target Date 9/19//2024       PT LONG TERM GOAL #9  Title Patient will be able to perform functional activities such as vacuuming or using backpack sprayer with modified independence for improved household chore abilities   Baseline 06/26/2022= Patient unable to perform either task but has strong desire to return to performing if able. 07/26/2022- Patient has not attempted to vacuum yet and PT session focusing on LE strengthening.; 09/04/22: Able to perform vacuuming tasks mod-I. Has  to break up tasks due to limited standing tolerance. Has not done outdoor tasks due to weather.  09/25/2022= Patient reports he was successful in vacuuming a few weeks ago- tiring but  able to perform; has not tried spraying or weedeating yet; 4/30: commenced activity simulation in session today. Still unable to perform spraying or edging at home as desired. 12/04/2022= Will reassess next visit; 12/06/2022= Patient reports plans to try before next visit.   Time 12   Period Weeks   Status Ongoing  Target Date 03/14/2023   PT LONG TERM GOAL #10  Title Patient will increase glute medius strength on Left LE from able to lift off 7" (sidelye on mat) to > 10" to improve Hip stability with standing, gait mechanics, and functional strength.   Baseline 06/26/2022= 7in in sidelye position. 07/26/2022- 7.5 in; 09/04/22: 8"; 09/25/2022: deferred; 4/30: deferred ; 12/04/2022= Will assess next visit; 12/06/2022= 5"; 12/25/2022= 8"  Time 12   Period Weeks   Status ONGOING  Target Date 9/19//2024     Plan - 12/13/21 1340     Clinical Impression Statement Treatment focused on LE strengthening and patient responded very well today- Able to demo improved sidelye left Hip abd after a few weeks of ongoing weakness. He was able to complete several other proximial LE strengthening exercises with progressing reps, ability to move against gravity. He was able to demo independent bed mobility today and able to walk well with only a couple of left foot drag.  Pt will continue to benefit from skilled PT services to address remaining deficits to maximize his functional independence with mobility and decrease fall risk.    Personal Factors and Comorbidities Age;Comorbidity 3+;Fitness;Past/Current Experience;Time since onset of injury/illness/exacerbation    Comorbidities aroxysmal a fib, MS, lymphedema, hypothyroidism, Graves Disease, arthritis, lumbar herniated disc (L5), depression and neuropathy    Examination-Activity Limitations Bed Mobility;Bend;Caring for Others;Carry;Reach Overhead;Locomotion Level;Lift;Hygiene/Grooming;Dressing;Squat;Stairs;Stand;Toileting;Transfers    Examination-Participation Restrictions  Cleaning;Community Activity;Laundry;Occupation;Shop;Volunteer;Yard Work    Conservation officer, historic buildings Evolving/Moderate complexity    Rehab Potential Fair    PT Frequency 1-2x / week    PT Duration 12 weeks    PT Treatment/Interventions ADLs/Self Care Home Management;Aquatic Therapy;Canalith Repostioning;Biofeedback;Cryotherapy;Ultrasound;Traction;Moist Heat;Electrical Stimulation;DME Instruction;Gait training;Therapeutic exercise;Therapeutic activities;Functional mobility training;Stair training;Balance training;Neuromuscular re-education;Patient/family education;Manual techniques;Orthotic Fit/Training;Compression bandaging;Passive range of motion;Vestibular;Taping;Splinting;Energy conservation;Dry needling;Visual/perceptual remediation/compensation    PT Next Visit Plan Continue with progessive balance training, Progressive LE strenthening as appropriate.    PT Home Exercise Plan Access Code: [email or web address] URL: [email or web address]  Added Bridges with BTB and encouraged more walking around the home during commerical breaks when watching TV.    Consulted and Agree with Plan of Care Patient            3:40 PM, 12/25/22  3:40 PM, 12/25/22 Louis Meckel, PT Physical Therapist - [facility]  Outpatient Physical Therapy- Main Campus [phone]

## 2023-01-01 ENCOUNTER — Ambulatory Visit: Payer: Medicare PPO

## 2023-01-03 ENCOUNTER — Ambulatory Visit: Payer: Medicare PPO

## 2023-01-08 ENCOUNTER — Ambulatory Visit: Payer: Medicare PPO

## 2023-01-08 DIAGNOSIS — R269 Unspecified abnormalities of gait and mobility: Secondary | ICD-10-CM

## 2023-01-08 DIAGNOSIS — R2681 Unsteadiness on feet: Secondary | ICD-10-CM

## 2023-01-08 DIAGNOSIS — M6281 Muscle weakness (generalized): Secondary | ICD-10-CM | POA: Diagnosis not present

## 2023-01-08 DIAGNOSIS — R2689 Other abnormalities of gait and mobility: Secondary | ICD-10-CM | POA: Diagnosis not present

## 2023-01-08 DIAGNOSIS — R262 Difficulty in walking, not elsewhere classified: Secondary | ICD-10-CM

## 2023-01-08 DIAGNOSIS — R278 Other lack of coordination: Secondary | ICD-10-CM | POA: Diagnosis not present

## 2023-01-08 NOTE — Therapy (Signed)
OUTPATIENT PHYSICAL THERAPY TREATMENT   Patient Name: Dustin Gray MRN: [id number] DOB:August 07, 1955, 67 y.o., male Today's Date: 01/08/2023  PCP: Blair Heys, MD REFERRING PROVIDER: Shirlean Schlein, MD   PT End of Session - 01/08/23 1513     Visit Number [id number]    Number of Visits 168    Date for PT Re-Evaluation 03/14/23    Authorization Type [employer] Auth 3/26-5/30/24- 15 visits                Past Medical History:  Diagnosis Date   Abscess    groin   Arthritis    lower spine   Erectile dysfunction    Low testosterone    Lumbar herniated disc    L5   Multiple sclerosis (HCC)    Staph aureus infection    Past Surgical History:  Procedure Laterality Date   CARDIOVERSION N/A 11/22/2021   Procedure: CARDIOVERSION;  Surgeon: Debbe Odea, MD;  Location: [facility];  Service: Cardiovascular;  Laterality: N/A;   COLONOSCOPY WITH PROPOFOL N/A 07/04/2016   Procedure: COLONOSCOPY WITH PROPOFOL;  Surgeon: Midge Minium, MD;  Location: [facility];  Service: Endoscopy;  Laterality: N/A;   FINGER SURGERY  1964   MASS EXCISION  1960   POLYPECTOMY  07/04/2016   Procedure: POLYPECTOMY;  Surgeon: Midge Minium, MD;  Location: [facility];  Service: Endoscopy;;   TONSILLECTOMY     Patient Active Problem List   Diagnosis Date Noted   Persistent atrial fibrillation (HCC)    Graves disease    Abdominal bloating    Atrial fibrillation with RVR (HCC) 10/01/2019   Hyperthyroidism    Atrial fibrillation with rapid ventricular response (HCC) 09/30/2019   Leg edema    Left leg cellulitis    Special screening for malignant neoplasms, colon    Polyp of sigmoid colon    Benign neoplasm of ascending colon    Rectal polyp    Herniated nucleus pulposus, L5-S1 11/09/2015   Low back pain 10/25/2015   Herniated nucleus pulposus, C5-6 right 10/06/2015   Dysesthesia 09/16/2015   Spasticity 09/16/2015   Unsteady gait 09/16/2015   Multiple sclerosis (HCC)  06/08/2011     REFERRING DIAG: R26.81 (ICD-10-CM) - Unsteadiness on feet   THERAPY DIAG:  Unsteadiness on feet  Difficulty in walking, not elsewhere classified  Abnormality of gait and mobility  Muscle weakness (generalized)  Rationale for Evaluation and Treatment Rehabilitation  PERTINENT HISTORY: Patient presents to physical therapy for unsteadiness, walking instability, fatigue. His PMH includes paroxysmal a fib, MS, lymphedema, hypothyroidism, Graves Disease, arthritis, lumbar herniated disc (L5), depression and neuropathy. Patient first developed symptoms of MS in 2005 and was diagnosed in 2006. Patient has done PT in the past, but hasn't been seen since 2020 at [facility]. Has not been doing his exercises in the past year. Walk outside to garage to ride lawnmower, walks in house. Retired Optician, dispensing. Has a rollator at home but doesn't use it in the house   PRECAUTIONS: Fall  SUBJECTIVE: Patient reports rough week last week, fatigue, not sleeping well. Pt is tired today but willing to try to give it his best effort.   Are you having pain? No   OBJECTIVE:  TODAY'S TREATMENT: 01/08/23  -460ft AMB overground  -lateral side stepping 6x bilat in // bars -backward stepping in // bars x12   PATIENT EDUCATION: Education details: exercise technique, body mechanics Person educated: Patient Education method: Medical illustrator, verbal cues Education comprehension: verbalized understanding, returned demonstration  HOME EXERCISE PROGRAM: No changes this session   PT Short Term Goals -       PT SHORT TERM GOAL #1   Title Patient will be independent in home exercise program to improve strength/mobility for better functional independence with ADLs.    Baseline 8/9: HEP to be given next session, 10/31: patient reports compliance with HEP and would like progressions added next session. 08/22/2021=Patient verbalized that he is walking and doing his LE exercises with  no questions at this time.    Time 4    Period Weeks    Status Achieved    Target Date 08/16/21              PT Long Term Goals       PT LONG TERM GOAL #1   Title Patient will increase FOTO score to equal to or greater than 70%  to demonstrate statistically significant improvement in mobility and quality of life.    Baseline 8/9: 64%; risk adjusted 34%; 03/21/21 FOTO: 45; 04/24/21 FOTO: 49% ; 08/22/2021= 56% 4/20: 60% 11/28/21: 63%; 01/04/2022= 60%. 03/22/2022= 73%; 10/23/22: 68   Time 12    Period Weeks    Status Achieved   Target Date 06/26/2022     PT LONG TERM GOAL #2   Title Patient will increase Berg Balance score by > 6 points to demonstrate decreased fall risk during functional activities.    Baseline 4/20: 43/56 6/6: 48/56; 713/2023= 50/56   Time 12    Period Weeks    Status Achieved   Target Date 03/29/2022     PT LONG TERM GOAL #3   Title Patient will increase 10 meter walk test to >1.63m/s as to improve gait speed for better community ambulation and to reduce fall risk.    Baseline 8/9: 0.65 m/s with rollator; 10/31: 0.68 m/s with rollator. 07/06/2021= 0.71 m/s using rollator; 08/22/2021= 0.94 m/s using rollator. 10/12/21: 1.1 m/s with rollator; 10/23/22: 0.55m/s    Time 12    Period Weeks    Status Achieved; maintaining gains    Target Date 10/11/21      PT LONG TERM GOAL #4   Title Patient will increase six minute walk test distance to >1000 for progression to community ambulator and improve gait ability    Baseline 8/9: 505 ft with rollator; 03/21/21: 720ft c rollator, 04/24/21: 742ft c rollator; 07/06/2021=900 feet; 08/22/2021= 935 feet with use of Rollator 44/20: 940 ft 11/28/21: 1055 feet with rollator   Time 12    Period Weeks    Status Achieved    Target Date 01/04/22      PT LONG TERM GOAL #5   Title Patient will increase glute medius strength on Left LE from able to lift off 3" (sidelye on mat) to > 5" to improve stability, gait mechanics, and functional strength.     Baseline 10/31: 3-/5; 07/06/2021= 3-/5; 08/22/2021=3-/5 unable to achieve full ROM yet able to withstand some resistance with testing 4/20: unable to obtainfull ROM against gravity 6/6: unable to move against gravity; 01/04/2022= Continues to have difficulty - unable to raise left LE in sidelye against gravity but able to perform supine left Hip abd with some resistance= 3-/5; 03/22/2022- Patient able to raise his left LE 3 in off mat today. 04/03/2022= Patient able to raise left LE 3" off mat in sidelye today; 05/01/2022= 05/01/2022= patient able to raise left LE from sidelye position= 5"; 06/12/2022- Patient presents with 6.25 in sidelye Left hip abd against gravity. 06/26/2022=  7in in sidelye position.    Time 12    Period Weeks    Status MET   Target Date 06/26/2022     PT LONG TERM GOAL #6   Title Patient will increase Left single leg stance time to 15 seconds or greater to increase safety in shower and independence with ADLs.    Baseline 10/31; 1.83 sec without UE support; 08/22/2021=2-3  sec  on left; 6 sec on right 4/20: RLE 10 seconds LLE unable to perform 6/6: R LE 4 seconds during BERG; 01/04/2022= left = 3 sec and right = 8 sec; 02/13/2022= 20 sec on right at best and 4 sec on left LE at best. 03/23/2022=Patient able to stand on left LE at best for 7 sec today. 04/03/2022= 27 sec on right and 12 sec on left; 06/12/2022= 13 sec on left  and 33 sec at best on right LE; 06/26/2021= 11 sec on left at best and 22 sec on right today; 07/26/2022= 10 sec on left and 20 sec on right today.; 09/04/22: 1-2 seconds on LLE. 27 sec on RLE. 12/04/2022= Will assess next visit   Time 12    Period Weeks    Status On-going    Target Date 03/14/2023     PT LONG TERM GOAL #7   Title Patient will safely negotiate 13 steps with handrails to safely enter/exit mothers house.    Baseline 4/20: very challenging 6/6: able to copmlete safely even managing dogs per pt report; 01/04/2022- Patient performed 16 steps with B Rail-no  difficulty other than fatigue.    Time 12    Period Weeks    Status Achieved   Target Date 01/04/22    PT LONG TERM GOAL #8  Title Patient will increase six minute walk test distance to >1100 ft.  for progression to community ambulator and improve gait ability   Baseline 04/03/2022= 920 feet with 4WW - (Added another goal for endurance to continue to progress community amb distances); 05/03/2022= 960 feet with upright 4WW (patient's first time using device): 06/12/2022= 990 feet with 4WW; 06/26/2022= 1010 feet with 4WW; 07/26/2022= 1015 feet; 09/04/22: 1,020'; 09/25/2022= Will reassess next visit. 10/04/2022= 1065ft; 12/06/2022= 900 feet  Time 12   Period Weeks   Status ONGOING  Target Date 9/19//2024       PT LONG TERM GOAL #9  Title Patient will be able to perform functional activities such as vacuuming or using backpack sprayer with modified independence for improved household chore abilities   Baseline 06/26/2022= Patient unable to perform either task but has strong desire to return to performing if able. 07/26/2022- Patient has not attempted to vacuum yet and PT session focusing on LE strengthening.; 09/04/22: Able to perform vacuuming tasks mod-I. Has to break up tasks due to limited standing tolerance. Has not done outdoor tasks due to weather.  09/25/2022= Patient reports he was successful in vacuuming a few weeks ago- tiring but able to perform; has not tried spraying or weedeating yet; 4/30: commenced activity simulation in session today. Still unable to perform spraying or edging at home as desired. 12/04/2022= Will reassess next visit; 12/06/2022= Patient reports plans to try before next visit.   Time 12   Period Weeks   Status Ongoing  Target Date 03/14/2023   PT LONG TERM GOAL #10  Title Patient will increase glute medius strength on Left LE from able to lift off 7" (sidelye on mat) to > 10" to improve Hip stability with standing, gait mechanics, and  functional strength.   Baseline 06/26/2022= 7in  in sidelye position. 07/26/2022- 7.5 in; 09/04/22: 8"; 09/25/2022: deferred; 4/30: deferred ; 12/04/2022= Will assess next visit; 12/06/2022= 5"; 12/25/2022= 8"  Time 12   Period Weeks   Status ONGOING  Target Date 9/19//2024     Plan - 12/13/21 1340     Clinical Impression Statement Attempted to advance volume in stepping interventions in 4 directions. Pt able to self monitor fatigue and communicate rest needs. Pt has good safety awareness and safe practices with DME and hand holds. Pt will continue to benefit from skilled PT services to address remaining deficits to maximize his functional independence with mobility and decrease fall risk.    Personal Factors and Comorbidities Age;Comorbidity 3+;Fitness;Past/Current Experience;Time since onset of injury/illness/exacerbation    Comorbidities aroxysmal a fib, MS, lymphedema, hypothyroidism, Graves Disease, arthritis, lumbar herniated disc (L5), depression and neuropathy    Examination-Activity Limitations Bed Mobility;Bend;Caring for Others;Carry;Reach Overhead;Locomotion Level;Lift;Hygiene/Grooming;Dressing;Squat;Stairs;Stand;Toileting;Transfers    Examination-Participation Restrictions Cleaning;Community Activity;Laundry;Occupation;Shop;Volunteer;Yard Work    Conservation officer, historic buildings Evolving/Moderate complexity    Rehab Potential Fair    PT Frequency 1-2x / week    PT Duration 12 weeks    PT Treatment/Interventions ADLs/Self Care Home Management;Aquatic Therapy;Canalith Repostioning;Biofeedback;Cryotherapy;Ultrasound;Traction;Moist Heat;Electrical Stimulation;DME Instruction;Gait training;Therapeutic exercise;Therapeutic activities;Functional mobility training;Stair training;Balance training;Neuromuscular re-education;Patient/family education;Manual techniques;Orthotic Fit/Training;Compression bandaging;Passive range of motion;Vestibular;Taping;Splinting;Energy conservation;Dry needling;Visual/perceptual remediation/compensation    PT Next  Visit Plan Continue with progessive balance training, Progressive LE strenthening as appropriate.    PT Home Exercise Plan Access Code: [email or web address] URL: [email or web address]  Added Bridges with BTB and encouraged more walking around the home during commerical breaks when watching TV.    Consulted and Agree with Plan of Care Patient              3:20 PM, 01/08/23 Rosamaria Lints, PT, DPT Physical Therapist - [facility]  Outpatient Physical Therapy- Main Campus [phone]

## 2023-01-10 ENCOUNTER — Ambulatory Visit: Payer: Medicare PPO

## 2023-01-15 ENCOUNTER — Ambulatory Visit: Payer: Medicare PPO

## 2023-01-16 NOTE — Therapy (Signed)
OUTPATIENT PHYSICAL THERAPY TREATMENT   Patient Name: Dustin Gray MRN: [id number] DOB:[birth year], 67 y.o., male Today's Date: 01/17/2023  PCP: Blair Heys, MD REFERRING PROVIDER: Shirlean Schlein, MD   PT End of Session - 01/17/23 1617     Visit Number 147    Number of Visits 168    Date for PT Re-Evaluation 03/14/23    Authorization Type [employer] Auth 3/26-5/30/24- 15 visits    Authorization Time Period 4/2-6/25/24    Progress Note Due on Visit 150    PT Start Time 1617    PT Stop Time 1659    PT Time Calculation (min) 42 min    Equipment Utilized During Treatment Gait belt    Activity Tolerance Patient tolerated treatment well;No increased pain    Behavior During Therapy WFL for tasks assessed/performed                 Past Medical History:  Diagnosis Date   Abscess    groin   Arthritis    lower spine   Erectile dysfunction    Low testosterone    Lumbar herniated disc    L5   Multiple sclerosis (HCC)    Staph aureus infection    Past Surgical History:  Procedure Laterality Date   CARDIOVERSION N/A 11/22/2021   Procedure: CARDIOVERSION;  Surgeon: Debbe Odea, MD;  Location: [facility];  Service: Cardiovascular;  Laterality: N/A;   COLONOSCOPY WITH PROPOFOL N/A 07/04/2016   Procedure: COLONOSCOPY WITH PROPOFOL;  Surgeon: Midge Minium, MD;  Location: [facility];  Service: Endoscopy;  Laterality: N/A;   FINGER SURGERY  1964   MASS EXCISION  1960   POLYPECTOMY  07/04/2016   Procedure: POLYPECTOMY;  Surgeon: Midge Minium, MD;  Location: [facility];  Service: Endoscopy;;   TONSILLECTOMY     Patient Active Problem List   Diagnosis Date Noted   Persistent atrial fibrillation (HCC)    Graves disease    Abdominal bloating    Atrial fibrillation with RVR (HCC) 10/01/2019   Hyperthyroidism    Atrial fibrillation with rapid ventricular response (HCC) 09/30/2019   Leg edema    Left leg cellulitis    Special screening for  malignant neoplasms, colon    Polyp of sigmoid colon    Benign neoplasm of ascending colon    Rectal polyp    Herniated nucleus pulposus, L5-S1 11/09/2015   Low back pain 10/25/2015   Herniated nucleus pulposus, C5-6 right 10/06/2015   Dysesthesia 09/16/2015   Spasticity 09/16/2015   Unsteady gait 09/16/2015   Multiple sclerosis (HCC) 06/08/2011     REFERRING DIAG: R26.81 (ICD-10-CM) - Unsteadiness on feet   THERAPY DIAG:  Unsteadiness on feet  Difficulty in walking, not elsewhere classified  Abnormality of gait and mobility  Muscle weakness (generalized)  Rationale for Evaluation and Treatment Rehabilitation  PERTINENT HISTORY: Patient presents to physical therapy for unsteadiness, walking instability, fatigue. His PMH includes paroxysmal a fib, MS, lymphedema, hypothyroidism, Graves Disease, arthritis, lumbar herniated disc (L5), depression and neuropathy. Patient first developed symptoms of MS in 2005 and was diagnosed in 2006. Patient has done PT in the past, but hasn't been seen since 2020 at [facility]. Has not been doing his exercises in the past year. Walk outside to garage to ride lawnmower, walks in house. Retired Optician, dispensing. Has a rollator at home but doesn't use it in the house   PRECAUTIONS: Fall  SUBJECTIVE:  Patient reports no falls or LOB. Reports 3/10 pain in bilateral LE's.  Are you having pain? No   OBJECTIVE:  TODAY'S TREATMENT: 01/17/23    Sidelye Hip abd 2 x 10 reps on left LE (able to achieve > 6" each rep)  Sidelye hip ER (Clamshell) 2 x 12 reps each LE Supine SLR  2 sets of 12 reps each LE; # 3 ankle weight RLE  Supine heel slide BLE 2 sets of 12 reps; 3lb ankle weight on RLE Supine Hip add (ball squeeze)+bridge 2 set x 12 reps; cue for glute squeeze  Seated: 15x STS  Modified single limb sit to stand with heel strike stance 10x each LE from raised plinth Bilateral LAQ with ball adduction 10x  Ball between knees; ER/step out 10x  each LE alternating  Woodchops with 5lb KB 10x each side seated  PATIENT EDUCATION: Education details: exercise technique, body mechanics Person educated: Patient Education method: Medical illustrator, verbal cues Education comprehension: verbalized understanding, returned demonstration   HOME EXERCISE PROGRAM: No changes this session   PT Short Term Goals -       PT SHORT TERM GOAL #1   Title Patient will be independent in home exercise program to improve strength/mobility for better functional independence with ADLs.    Baseline 8/9: HEP to be given next session, 10/31: patient reports compliance with HEP and would like progressions added next session. 08/22/2021=Patient verbalized that he is walking and doing his LE exercises with no questions at this time.    Time 4    Period Weeks    Status Achieved    Target Date 08/16/21              PT Long Term Goals       PT LONG TERM GOAL #1   Title Patient will increase FOTO score to equal to or greater than 70%  to demonstrate statistically significant improvement in mobility and quality of life.    Baseline 8/9: 64%; risk adjusted 34%; 03/21/21 FOTO: 45; 04/24/21 FOTO: 49% ; 08/22/2021= 56% 4/20: 60% 11/28/21: 63%; 01/04/2022= 60%. 03/22/2022= 73%; 10/23/22: 68   Time 12    Period Weeks    Status Achieved   Target Date 06/26/2022     PT LONG TERM GOAL #2   Title Patient will increase Berg Balance score by > 6 points to demonstrate decreased fall risk during functional activities.    Baseline 4/20: 43/56 6/6: 48/56; 713/2023= 50/56   Time 12    Period Weeks    Status Achieved   Target Date 03/29/2022     PT LONG TERM GOAL #3   Title Patient will increase 10 meter walk test to >1.24m/s as to improve gait speed for better community ambulation and to reduce fall risk.    Baseline 8/9: 0.65 m/s with rollator; 10/31: 0.68 m/s with rollator. 07/06/2021= 0.71 m/s using rollator; 08/22/2021= 0.94 m/s using rollator. 10/12/21: 1.1  m/s with rollator; 10/23/22: 0.76m/s    Time 12    Period Weeks    Status Achieved; maintaining gains    Target Date 10/11/21      PT LONG TERM GOAL #4   Title Patient will increase six minute walk test distance to >1000 for progression to community ambulator and improve gait ability    Baseline 8/9: 505 ft with rollator; 03/21/21: 742ft c rollator, 04/24/21: 733ft c rollator; 07/06/2021=900 feet; 08/22/2021= 935 feet with use of Rollator 44/20: 940 ft 11/28/21: 1055 feet with rollator   Time 12    Period Weeks    Status Achieved  Target Date 01/04/22      PT LONG TERM GOAL #5   Title Patient will increase glute medius strength on Left LE from able to lift off 3" (sidelye on mat) to > 5" to improve stability, gait mechanics, and functional strength.    Baseline 10/31: 3-/5; 07/06/2021= 3-/5; 08/22/2021=3-/5 unable to achieve full ROM yet able to withstand some resistance with testing 4/20: unable to obtainfull ROM against gravity 6/6: unable to move against gravity; 01/04/2022= Continues to have difficulty - unable to raise left LE in sidelye against gravity but able to perform supine left Hip abd with some resistance= 3-/5; 03/22/2022- Patient able to raise his left LE 3 in off mat today. 04/03/2022= Patient able to raise left LE 3" off mat in sidelye today; 05/01/2022= 05/01/2022= patient able to raise left LE from sidelye position= 5"; 06/12/2022- Patient presents with 6.25 in sidelye Left hip abd against gravity. 06/26/2022= 7in in sidelye position.    Time 12    Period Weeks    Status MET   Target Date 06/26/2022     PT LONG TERM GOAL #6   Title Patient will increase Left single leg stance time to 15 seconds or greater to increase safety in shower and independence with ADLs.    Baseline 10/31; 1.83 sec without UE support; 08/22/2021=2-3  sec  on left; 6 sec on right 4/20: RLE 10 seconds LLE unable to perform 6/6: R LE 4 seconds during BERG; 01/04/2022= left = 3 sec and right = 8 sec; 02/13/2022= 20 sec  on right at best and 4 sec on left LE at best. 03/23/2022=Patient able to stand on left LE at best for 7 sec today. 04/03/2022= 27 sec on right and 12 sec on left; 06/12/2022= 13 sec on left  and 33 sec at best on right LE; 06/26/2021= 11 sec on left at best and 22 sec on right today; 07/26/2022= 10 sec on left and 20 sec on right today.; 09/04/22: 1-2 seconds on LLE. 27 sec on RLE. 12/04/2022= Will assess next visit   Time 12    Period Weeks    Status On-going    Target Date 03/14/2023     PT LONG TERM GOAL #7   Title Patient will safely negotiate 13 steps with handrails to safely enter/exit mothers house.    Baseline 4/20: very challenging 6/6: able to copmlete safely even managing dogs per pt report; 01/04/2022- Patient performed 16 steps with B Rail-no difficulty other than fatigue.    Time 12    Period Weeks    Status Achieved   Target Date 01/04/22    PT LONG TERM GOAL #8  Title Patient will increase six minute walk test distance to >1100 ft.  for progression to community ambulator and improve gait ability   Baseline 04/03/2022= 920 feet with 4WW - (Added another goal for endurance to continue to progress community amb distances); 05/03/2022= 960 feet with upright 4WW (patient's first time using device): 06/12/2022= 990 feet with 4WW; 06/26/2022= 1010 feet with 4WW; 07/26/2022= 1015 feet; 09/04/22: 1,020'; 09/25/2022= Will reassess next visit. 10/04/2022= 1016ft; 12/06/2022= 900 feet  Time 12   Period Weeks   Status ONGOING  Target Date 9/19//2024       PT LONG TERM GOAL #9  Title Patient will be able to perform functional activities such as vacuuming or using backpack sprayer with modified independence for improved household chore abilities   Baseline 06/26/2022= Patient unable to perform either task but has strong desire  to return to performing if able. 07/26/2022- Patient has not attempted to vacuum yet and PT session focusing on LE strengthening.; 09/04/22: Able to perform vacuuming tasks mod-I. Has to  break up tasks due to limited standing tolerance. Has not done outdoor tasks due to weather.  09/25/2022= Patient reports he was successful in vacuuming a few weeks ago- tiring but able to perform; has not tried spraying or weedeating yet; 4/30: commenced activity simulation in session today. Still unable to perform spraying or edging at home as desired. 12/04/2022= Will reassess next visit; 12/06/2022= Patient reports plans to try before next visit.   Time 12   Period Weeks   Status Ongoing  Target Date 03/14/2023   PT LONG TERM GOAL #10  Title Patient will increase glute medius strength on Left LE from able to lift off 7" (sidelye on mat) to > 10" to improve Hip stability with standing, gait mechanics, and functional strength.   Baseline 06/26/2022= 7in in sidelye position. 07/26/2022- 7.5 in; 09/04/22: 8"; 09/25/2022: deferred; 4/30: deferred ; 12/04/2022= Will assess next visit; 12/06/2022= 5"; 12/25/2022= 8"  Time 12   Period Weeks   Status ONGOING  Target Date 9/19//2024     Plan - 12/13/21 1340     Clinical Impression Statement  Patient is highly motivated throughout session. Patient is challenged with eccentric control of LLE this session with heel slide but improves with good focus and external cueing. Reports some pain coming in but no aggravation with exercises. Patient tolerates external rotation seated with LLE this session as well as modified sit to stands with single limb.  Pt will continue to benefit from skilled PT services to address remaining deficits to maximize his functional independence with mobility and decrease fall risk.    Personal Factors and Comorbidities Age;Comorbidity 3+;Fitness;Past/Current Experience;Time since onset of injury/illness/exacerbation    Comorbidities aroxysmal a fib, MS, lymphedema, hypothyroidism, Graves Disease, arthritis, lumbar herniated disc (L5), depression and neuropathy    Examination-Activity Limitations Bed Mobility;Bend;Caring for Others;Carry;Reach  Overhead;Locomotion Level;Lift;Hygiene/Grooming;Dressing;Squat;Stairs;Stand;Toileting;Transfers    Examination-Participation Restrictions Cleaning;Community Activity;Laundry;Occupation;Shop;Volunteer;Yard Work    Conservation officer, historic buildings Evolving/Moderate complexity    Rehab Potential Fair    PT Frequency 1-2x / week    PT Duration 12 weeks    PT Treatment/Interventions ADLs/Self Care Home Management;Aquatic Therapy;Canalith Repostioning;Biofeedback;Cryotherapy;Ultrasound;Traction;Moist Heat;Electrical Stimulation;DME Instruction;Gait training;Therapeutic exercise;Therapeutic activities;Functional mobility training;Stair training;Balance training;Neuromuscular re-education;Patient/family education;Manual techniques;Orthotic Fit/Training;Compression bandaging;Passive range of motion;Vestibular;Taping;Splinting;Energy conservation;Dry needling;Visual/perceptual remediation/compensation    PT Next Visit Plan Continue with progessive balance training, Progressive LE strenthening as appropriate.    PT Home Exercise Plan Access Code: [email or web address] URL: [email or web address]  Added Bridges with BTB and encouraged more walking around the home during commerical breaks when watching TV.    Consulted and Agree with Plan of Care Patient              5:02 PM, 01/17/23 Precious Bard PT  Physical Therapist - [facility]  Outpatient Physical Therapy- Main Campus [phone]

## 2023-01-17 ENCOUNTER — Ambulatory Visit: Payer: Medicare PPO

## 2023-01-17 DIAGNOSIS — R262 Difficulty in walking, not elsewhere classified: Secondary | ICD-10-CM | POA: Diagnosis not present

## 2023-01-17 DIAGNOSIS — R269 Unspecified abnormalities of gait and mobility: Secondary | ICD-10-CM | POA: Diagnosis not present

## 2023-01-17 DIAGNOSIS — R2689 Other abnormalities of gait and mobility: Secondary | ICD-10-CM | POA: Diagnosis not present

## 2023-01-17 DIAGNOSIS — R2681 Unsteadiness on feet: Secondary | ICD-10-CM

## 2023-01-17 DIAGNOSIS — M6281 Muscle weakness (generalized): Secondary | ICD-10-CM

## 2023-01-17 DIAGNOSIS — R278 Other lack of coordination: Secondary | ICD-10-CM | POA: Diagnosis not present

## 2023-01-22 ENCOUNTER — Ambulatory Visit: Payer: Medicare PPO

## 2023-01-24 ENCOUNTER — Ambulatory Visit: Payer: Medicare PPO

## 2023-01-29 ENCOUNTER — Ambulatory Visit: Payer: Medicare PPO

## 2023-01-31 ENCOUNTER — Ambulatory Visit: Payer: Medicare PPO

## 2023-02-05 ENCOUNTER — Ambulatory Visit: Payer: Medicare PPO | Attending: Neurology

## 2023-02-05 DIAGNOSIS — M6281 Muscle weakness (generalized): Secondary | ICD-10-CM

## 2023-02-05 DIAGNOSIS — R269 Unspecified abnormalities of gait and mobility: Secondary | ICD-10-CM

## 2023-02-05 DIAGNOSIS — R278 Other lack of coordination: Secondary | ICD-10-CM | POA: Diagnosis not present

## 2023-02-05 DIAGNOSIS — R262 Difficulty in walking, not elsewhere classified: Secondary | ICD-10-CM | POA: Diagnosis not present

## 2023-02-05 DIAGNOSIS — R2681 Unsteadiness on feet: Secondary | ICD-10-CM

## 2023-02-05 DIAGNOSIS — R2689 Other abnormalities of gait and mobility: Secondary | ICD-10-CM | POA: Diagnosis not present

## 2023-02-05 NOTE — Therapy (Signed)
OUTPATIENT PHYSICAL THERAPY TREATMENT   Patient Name: Dustin Gray MRN: [id number] DOB:06-Jan-1956, 67 y.o., male Today's Date: 02/05/2023  PCP: Blair Heys, MD REFERRING PROVIDER: Shirlean Schlein, MD   PT End of Session - 02/05/23 1459     Visit Number 148    Number of Visits 168    Date for PT Re-Evaluation 03/14/23    Authorization Type [employer] Auth 3/26-5/30/24- 15 visits    Authorization Time Period 4/2-6/25/24    Progress Note Due on Visit 150    PT Start Time 1450    Equipment Utilized During Treatment Gait belt    Activity Tolerance Patient tolerated treatment well;No increased pain    Behavior During Therapy WFL for tasks assessed/performed                 Past Medical History:  Diagnosis Date   Abscess    groin   Arthritis    lower spine   Erectile dysfunction    Low testosterone    Lumbar herniated disc    L5   Multiple sclerosis (HCC)    Staph aureus infection    Past Surgical History:  Procedure Laterality Date   CARDIOVERSION N/A 11/22/2021   Procedure: CARDIOVERSION;  Surgeon: Debbe Odea, MD;  Location: [facility];  Service: Cardiovascular;  Laterality: N/A;   COLONOSCOPY WITH PROPOFOL N/A 07/04/2016   Procedure: COLONOSCOPY WITH PROPOFOL;  Surgeon: Midge Minium, MD;  Location: [facility];  Service: Endoscopy;  Laterality: N/A;   FINGER SURGERY  1964   MASS EXCISION  1960   POLYPECTOMY  07/04/2016   Procedure: POLYPECTOMY;  Surgeon: Midge Minium, MD;  Location: [facility];  Service: Endoscopy;;   TONSILLECTOMY     Patient Active Problem List   Diagnosis Date Noted   Persistent atrial fibrillation (HCC)    Graves disease    Abdominal bloating    Atrial fibrillation with RVR (HCC) 10/01/2019   Hyperthyroidism    Atrial fibrillation with rapid ventricular response (HCC) 09/30/2019   Leg edema    Left leg cellulitis    Special screening for malignant neoplasms, colon    Polyp of sigmoid colon     Benign neoplasm of ascending colon    Rectal polyp    Herniated nucleus pulposus, L5-S1 11/09/2015   Low back pain 10/25/2015   Herniated nucleus pulposus, C5-6 right 10/06/2015   Dysesthesia 09/16/2015   Spasticity 09/16/2015   Unsteady gait 09/16/2015   Multiple sclerosis (HCC) 06/08/2011     REFERRING DIAG: R26.81 (ICD-10-CM) - Unsteadiness on feet   THERAPY DIAG:  Unsteadiness on feet  Difficulty in walking, not elsewhere classified  Abnormality of gait and mobility  Muscle weakness (generalized)  Rationale for Evaluation and Treatment Rehabilitation  PERTINENT HISTORY: Patient presents to physical therapy for unsteadiness, walking instability, fatigue. His PMH includes paroxysmal a fib, MS, lymphedema, hypothyroidism, Graves Disease, arthritis, lumbar herniated disc (L5), depression and neuropathy. Patient first developed symptoms of MS in 2005 and was diagnosed in 2006. Patient has done PT in the past, but hasn't been seen since 2020 at [facility]. Has not been doing his exercises in the past year. Walk outside to garage to ride lawnmower, walks in house. Retired Optician, dispensing. Has a rollator at home but doesn't use it in the house   PRECAUTIONS: Fall  SUBJECTIVE:  Patient reports having a lot of personal issues - mother's failing health. States feeling much weaker since not coming.   Are you having pain? No  OBJECTIVE:  TODAY'S TREATMENT: 02/05/23      15x STS  x 2 sets Standing hip abd 4# AW 2 sets of 10 reps  Bilateral LAQ with ball adduction 10x  Resistive gait using 4WW -320 feet with 4# AW Standing Hip circles with one LE and static standing on other LE- 10 reps  PATIENT EDUCATION: Education details: exercise technique, body mechanics Person educated: Patient Education method: Medical illustrator, verbal cues Education comprehension: verbalized understanding, returned demonstration   HOME EXERCISE PROGRAM: No changes this session    PT Short Term Goals -       PT SHORT TERM GOAL #1   Title Patient will be independent in home exercise program to improve strength/mobility for better functional independence with ADLs.    Baseline 8/9: HEP to be given next session, 10/31: patient reports compliance with HEP and would like progressions added next session. 08/22/2021=Patient verbalized that he is walking and doing his LE exercises with no questions at this time.    Time 4    Period Weeks    Status Achieved    Target Date 08/16/21              PT Long Term Goals       PT LONG TERM GOAL #1   Title Patient will increase FOTO score to equal to or greater than 70%  to demonstrate statistically significant improvement in mobility and quality of life.    Baseline 8/9: 64%; risk adjusted 34%; 03/21/21 FOTO: 45; 04/24/21 FOTO: 49% ; 08/22/2021= 56% 4/20: 60% 11/28/21: 63%; 01/04/2022= 60%. 03/22/2022= 73%; 10/23/22: 68   Time 12    Period Weeks    Status Achieved   Target Date 06/26/2022     PT LONG TERM GOAL #2   Title Patient will increase Berg Balance score by > 6 points to demonstrate decreased fall risk during functional activities.    Baseline 4/20: 43/56 6/6: 48/56; 713/2023= 50/56   Time 12    Period Weeks    Status Achieved   Target Date 03/29/2022     PT LONG TERM GOAL #3   Title Patient will increase 10 meter walk test to >1.76m/s as to improve gait speed for better community ambulation and to reduce fall risk.    Baseline 8/9: 0.65 m/s with rollator; 10/31: 0.68 m/s with rollator. 07/06/2021= 0.71 m/s using rollator; 08/22/2021= 0.94 m/s using rollator. 10/12/21: 1.1 m/s with rollator; 10/23/22: 0.41m/s    Time 12    Period Weeks    Status Achieved; maintaining gains    Target Date 10/11/21      PT LONG TERM GOAL #4   Title Patient will increase six minute walk test distance to >1000 for progression to community ambulator and improve gait ability    Baseline 8/9: 505 ft with rollator; 03/21/21: 783ft c rollator,  04/24/21: 775ft c rollator; 07/06/2021=900 feet; 08/22/2021= 935 feet with use of Rollator 44/20: 940 ft 11/28/21: 1055 feet with rollator   Time 12    Period Weeks    Status Achieved    Target Date 01/04/22      PT LONG TERM GOAL #5   Title Patient will increase glute medius strength on Left LE from able to lift off 3" (sidelye on mat) to > 5" to improve stability, gait mechanics, and functional strength.    Baseline 10/31: 3-/5; 07/06/2021= 3-/5; 08/22/2021=3-/5 unable to achieve full ROM yet able to withstand some resistance with testing 4/20: unable to obtainfull ROM against gravity  6/6: unable to move against gravity; 01/04/2022= Continues to have difficulty - unable to raise left LE in sidelye against gravity but able to perform supine left Hip abd with some resistance= 3-/5; 03/22/2022- Patient able to raise his left LE 3 in off mat today. 04/03/2022= Patient able to raise left LE 3" off mat in sidelye today; 05/01/2022= 05/01/2022= patient able to raise left LE from sidelye position= 5"; 06/12/2022- Patient presents with 6.25 in sidelye Left hip abd against gravity. 06/26/2022= 7in in sidelye position.    Time 12    Period Weeks    Status MET   Target Date 06/26/2022     PT LONG TERM GOAL #6   Title Patient will increase Left single leg stance time to 15 seconds or greater to increase safety in shower and independence with ADLs.    Baseline 10/31; 1.83 sec without UE support; 08/22/2021=2-3  sec  on left; 6 sec on right 4/20: RLE 10 seconds LLE unable to perform 6/6: R LE 4 seconds during BERG; 01/04/2022= left = 3 sec and right = 8 sec; 02/13/2022= 20 sec on right at best and 4 sec on left LE at best. 03/23/2022=Patient able to stand on left LE at best for 7 sec today. 04/03/2022= 27 sec on right and 12 sec on left; 06/12/2022= 13 sec on left  and 33 sec at best on right LE; 06/26/2021= 11 sec on left at best and 22 sec on right today; 07/26/2022= 10 sec on left and 20 sec on right today.; 09/04/22: 1-2 seconds  on LLE. 27 sec on RLE. 12/04/2022= Will assess next visit   Time 12    Period Weeks    Status On-going    Target Date 03/14/2023     PT LONG TERM GOAL #7   Title Patient will safely negotiate 13 steps with handrails to safely enter/exit mothers house.    Baseline 4/20: very challenging 6/6: able to copmlete safely even managing dogs per pt report; 01/04/2022- Patient performed 16 steps with B Rail-no difficulty other than fatigue.    Time 12    Period Weeks    Status Achieved   Target Date 01/04/22    PT LONG TERM GOAL #8  Title Patient will increase six minute walk test distance to >1100 ft.  for progression to community ambulator and improve gait ability   Baseline 04/03/2022= 920 feet with 4WW - (Added another goal for endurance to continue to progress community amb distances); 05/03/2022= 960 feet with upright 4WW (patient's first time using device): 06/12/2022= 990 feet with 4WW; 06/26/2022= 1010 feet with 4WW; 07/26/2022= 1015 feet; 09/04/22: 1,020'; 09/25/2022= Will reassess next visit. 10/04/2022= 1032ft; 12/06/2022= 900 feet  Time 12   Period Weeks   Status ONGOING  Target Date 9/19//2024       PT LONG TERM GOAL #9  Title Patient will be able to perform functional activities such as vacuuming or using backpack sprayer with modified independence for improved household chore abilities   Baseline 06/26/2022= Patient unable to perform either task but has strong desire to return to performing if able. 07/26/2022- Patient has not attempted to vacuum yet and PT session focusing on LE strengthening.; 09/04/22: Able to perform vacuuming tasks mod-I. Has to break up tasks due to limited standing tolerance. Has not done outdoor tasks due to weather.  09/25/2022= Patient reports he was successful in vacuuming a few weeks ago- tiring but able to perform; has not tried spraying or weedeating yet; 4/30: commenced activity  simulation in session today. Still unable to perform spraying or edging at home as desired.  12/04/2022= Will reassess next visit; 12/06/2022= Patient reports plans to try before next visit.   Time 12   Period Weeks   Status Ongoing  Target Date 03/14/2023   PT LONG TERM GOAL #10  Title Patient will increase glute medius strength on Left LE from able to lift off 7" (sidelye on mat) to > 10" to improve Hip stability with standing, gait mechanics, and functional strength.   Baseline 06/26/2022= 7in in sidelye position. 07/26/2022- 7.5 in; 09/04/22: 8"; 09/25/2022: deferred; 4/30: deferred ; 12/04/2022= Will assess next visit; 12/06/2022= 5"; 12/25/2022= 8"  Time 12   Period Weeks   Status ONGOING  Target Date 9/19//2024     Plan - 12/13/21 1340     Clinical Impression Statement  Patient is highly motivated throughout session. Patient is challenged with eccentric control of LLE this session with heel slide but improves with good focus and external cueing. Reports some pain coming in but no aggravation with exercises. Patient tolerates external rotation seated with LLE this session as well as modified sit to stands with single limb.  Pt will continue to benefit from skilled PT services to address remaining deficits to maximize his functional independence with mobility and decrease fall risk.    Personal Factors and Comorbidities Age;Comorbidity 3+;Fitness;Past/Current Experience;Time since onset of injury/illness/exacerbation    Comorbidities aroxysmal a fib, MS, lymphedema, hypothyroidism, Graves Disease, arthritis, lumbar herniated disc (L5), depression and neuropathy    Examination-Activity Limitations Bed Mobility;Bend;Caring for Others;Carry;Reach Overhead;Locomotion Level;Lift;Hygiene/Grooming;Dressing;Squat;Stairs;Stand;Toileting;Transfers    Examination-Participation Restrictions Cleaning;Community Activity;Laundry;Occupation;Shop;Volunteer;Yard Work    Conservation officer, historic buildings Evolving/Moderate complexity    Rehab Potential Fair    PT Frequency 1-2x / week    PT Duration 12 weeks     PT Treatment/Interventions ADLs/Self Care Home Management;Aquatic Therapy;Canalith Repostioning;Biofeedback;Cryotherapy;Ultrasound;Traction;Moist Heat;Electrical Stimulation;DME Instruction;Gait training;Therapeutic exercise;Therapeutic activities;Functional mobility training;Stair training;Balance training;Neuromuscular re-education;Patient/family education;Manual techniques;Orthotic Fit/Training;Compression bandaging;Passive range of motion;Vestibular;Taping;Splinting;Energy conservation;Dry needling;Visual/perceptual remediation/compensation    PT Next Visit Plan Continue with progessive balance training, Progressive LE strenthening as appropriate.    PT Home Exercise Plan Access Code: [email or web address] URL: [email or web address]  Added Bridges with BTB and encouraged more walking around the home during commerical breaks when watching TV.    Consulted and Agree with Plan of Care Patient              3:00 PM, 02/05/23 Lenda Kelp PT  Physical Therapist - [facility] [facility]  Outpatient Physical Therapy- Main Campus [phone]

## 2023-02-07 ENCOUNTER — Ambulatory Visit: Payer: Medicare PPO

## 2023-02-12 ENCOUNTER — Ambulatory Visit: Payer: Medicare PPO

## 2023-02-14 ENCOUNTER — Ambulatory Visit: Payer: Medicare PPO

## 2023-02-19 ENCOUNTER — Ambulatory Visit: Payer: Medicare PPO

## 2023-02-21 ENCOUNTER — Ambulatory Visit: Payer: Medicare PPO

## 2023-02-26 ENCOUNTER — Ambulatory Visit: Payer: Medicare PPO

## 2023-02-28 ENCOUNTER — Ambulatory Visit: Payer: Medicare PPO

## 2023-03-05 ENCOUNTER — Ambulatory Visit: Payer: Medicare PPO | Attending: Neurology

## 2023-03-05 ENCOUNTER — Telehealth: Payer: Self-pay

## 2023-03-05 NOTE — Telephone Encounter (Signed)
Patient Name: Dustin Gray MRN: [id number] DOB:Jun 17, 1956, 66 y.o., male Today's Date: 03/05/2023  Phone call to patient secondary to patient did not show for his appointment. Left voicemail reaching out to patient and instructed him to call office when able.    Lenda Kelp, PT 03/05/2023, 3:19 PM

## 2023-03-07 ENCOUNTER — Ambulatory Visit: Payer: Medicare PPO

## 2023-03-12 ENCOUNTER — Ambulatory Visit: Payer: Medicare PPO

## 2023-03-14 ENCOUNTER — Ambulatory Visit: Payer: Medicare PPO

## 2023-03-19 ENCOUNTER — Ambulatory Visit: Payer: Medicare PPO

## 2023-03-21 ENCOUNTER — Ambulatory Visit: Payer: Medicare PPO

## 2023-03-26 ENCOUNTER — Ambulatory Visit: Payer: Medicare PPO

## 2023-03-28 ENCOUNTER — Ambulatory Visit: Payer: Medicare PPO

## 2023-04-02 ENCOUNTER — Ambulatory Visit: Payer: Medicare PPO

## 2023-04-04 ENCOUNTER — Ambulatory Visit: Payer: Medicare PPO

## 2023-04-09 ENCOUNTER — Ambulatory Visit: Payer: Medicare PPO

## 2023-04-11 ENCOUNTER — Ambulatory Visit: Payer: Medicare PPO

## 2023-04-16 ENCOUNTER — Ambulatory Visit: Payer: Medicare PPO

## 2023-04-18 ENCOUNTER — Ambulatory Visit: Payer: Medicare PPO

## 2023-04-23 ENCOUNTER — Ambulatory Visit: Payer: Medicare PPO | Attending: Neurology

## 2023-04-23 DIAGNOSIS — R269 Unspecified abnormalities of gait and mobility: Secondary | ICD-10-CM | POA: Insufficient documentation

## 2023-04-23 DIAGNOSIS — R262 Difficulty in walking, not elsewhere classified: Secondary | ICD-10-CM | POA: Insufficient documentation

## 2023-04-23 DIAGNOSIS — R2689 Other abnormalities of gait and mobility: Secondary | ICD-10-CM | POA: Insufficient documentation

## 2023-04-23 DIAGNOSIS — M6281 Muscle weakness (generalized): Secondary | ICD-10-CM | POA: Insufficient documentation

## 2023-04-23 DIAGNOSIS — R278 Other lack of coordination: Secondary | ICD-10-CM | POA: Insufficient documentation

## 2023-04-23 DIAGNOSIS — R2681 Unsteadiness on feet: Secondary | ICD-10-CM | POA: Insufficient documentation

## 2023-04-23 NOTE — Therapy (Signed)
OUTPATIENT PHYSICAL THERAPY TREATMENT/PT DISCHARGE   Patient Name: Dustin Gray MRN: [id number] DOB:01/05/1956, 67 y.o., male Today's Date: 04/24/2023  PCP: Blair Heys, MD REFERRING PROVIDER: Shirlean Schlein, MD   PT End of Session - 04/23/23 1455     Visit Number 148    Number of Visits 168    Date for PT Re-Evaluation 03/14/23    Authorization Type [employer] Auth 3/26-5/30/24- 15 visits    Authorization Time Period 4/2-6/25/24    Progress Note Due on Visit 150    PT Start Time 1430    PT Stop Time 1445    PT Time Calculation (min) 15 min    Equipment Utilized During Treatment Gait belt    Activity Tolerance Patient tolerated treatment well;No increased pain    Behavior During Therapy WFL for tasks assessed/performed                 Past Medical History:  Diagnosis Date   Abscess    groin   Arthritis    lower spine   Erectile dysfunction    Low testosterone    Lumbar herniated disc    L5   Multiple sclerosis (HCC)    Staph aureus infection    Past Surgical History:  Procedure Laterality Date   CARDIOVERSION N/A 11/22/2021   Procedure: CARDIOVERSION;  Surgeon: Debbe Odea, MD;  Location: [facility];  Service: Cardiovascular;  Laterality: N/A;   COLONOSCOPY WITH PROPOFOL N/A 07/04/2016   Procedure: COLONOSCOPY WITH PROPOFOL;  Surgeon: Midge Minium, MD;  Location: [facility];  Service: Endoscopy;  Laterality: N/A;   FINGER SURGERY  1964   MASS EXCISION  1960   POLYPECTOMY  07/04/2016   Procedure: POLYPECTOMY;  Surgeon: Midge Minium, MD;  Location: [facility];  Service: Endoscopy;;   TONSILLECTOMY     Patient Active Problem List   Diagnosis Date Noted   Persistent atrial fibrillation (HCC)    Graves disease    Abdominal bloating    Atrial fibrillation with RVR (HCC) 10/01/2019   Hyperthyroidism    Atrial fibrillation with rapid ventricular response (HCC) 09/30/2019   Leg edema    Left leg cellulitis    Special  screening for malignant neoplasms, colon    Polyp of sigmoid colon    Benign neoplasm of ascending colon    Rectal polyp    Herniated nucleus pulposus, L5-S1 11/09/2015   Low back pain 10/25/2015   Herniated nucleus pulposus, C5-6 right 10/06/2015   Dysesthesia 09/16/2015   Spasticity 09/16/2015   Unsteady gait 09/16/2015   Multiple sclerosis (HCC) 06/08/2011     REFERRING DIAG: R26.81 (ICD-10-CM) - Unsteadiness on feet   THERAPY DIAG:  Unsteadiness on feet  Difficulty in walking, not elsewhere classified  Abnormality of gait and mobility  Muscle weakness (generalized)  Rationale for Evaluation and Treatment Rehabilitation  PERTINENT HISTORY: Patient presents to physical therapy for unsteadiness, walking instability, fatigue. His PMH includes paroxysmal a fib, MS, lymphedema, hypothyroidism, Graves Disease, arthritis, lumbar herniated disc (L5), depression and neuropathy. Patient first developed symptoms of MS in 2005 and was diagnosed in 2006. Patient has done PT in the past, but hasn't been seen since 2020 at [facility]. Has not been doing his exercises in the past year. Walk outside to garage to ride lawnmower, walks in house. Retired Optician, dispensing. Has a rollator at home but doesn't use it in the house   PRECAUTIONS: Fall  SUBJECTIVE:  Patient reports being out of PT for so long as he  was dealing with his sick mother who then passed away in 03-27-23. He states he is dealing with her estate issues and will be moving to [facility] in near future. He requested to be discharged today as he was going to be spending the majority of his time in [facility] over next several weeks -then with plan to move down to mothers home in [place] [facility] [facility]   Are you having pain? No   OBJECTIVE:  TODAY'S TREATMENT: 04/24/23      PT discussion about Plan of care to d/c per patient request   PATIENT EDUCATION: Education details: exercise technique, body mechanics Person educated: Patient Education method:  Medical illustrator, verbal cues Education comprehension: verbalized understanding, returned demonstration   HOME EXERCISE PROGRAM: No changes this session   PT Short Term Goals -       PT SHORT TERM GOAL #1   Title Patient will be independent in home exercise program to improve strength/mobility for better functional independence with ADLs.    Baseline 8/9: HEP to be given next session, 10/31: patient reports compliance with HEP and would like progressions added next session. 08/22/2021=Patient verbalized that he is walking and doing his LE exercises with no questions at this time.    Time 4    Period Weeks    Status Achieved    Target Date 08/16/21              PT Long Term Goals       PT LONG TERM GOAL #1   Title Patient will increase FOTO score to equal to or greater than 70%  to demonstrate statistically significant improvement in mobility and quality of life.    Baseline 8/9: 64%; risk adjusted 34%; 03/21/21 FOTO: 45; 04/24/21 FOTO: 49% ; 08/22/2021= 56% 4/20: 60% 11/28/21: 63%; 01/04/2022= 60%. 03/22/2022= 73%; 10/23/22: 68   Time 12    Period Weeks    Status Achieved   Target Date 06/26/2022     PT LONG TERM GOAL #2   Title Patient will increase Berg Balance score by > 6 points to demonstrate decreased fall risk during functional activities.    Baseline 4/20: 43/56 6/6: 48/56; 713/2023= 50/56   Time 12    Period Weeks    Status Achieved   Target Date 03/29/2022     PT LONG TERM GOAL #3   Title Patient will increase 10 meter walk test to >1.56m/s as to improve gait speed for better community ambulation and to reduce fall risk.    Baseline 8/9: 0.65 m/s with rollator; 10/31: 0.68 m/s with rollator. 07/06/2021= 0.71 m/s using rollator; 08/22/2021= 0.94 m/s using rollator. 10/12/21: 1.1 m/s with rollator; 10/23/22: 0.56m/s    Time 12    Period Weeks    Status Achieved; maintaining gains    Target Date 10/11/21      PT LONG TERM GOAL #4   Title Patient will increase  six minute walk test distance to >1000 for progression to community ambulator and improve gait ability    Baseline 8/9: 505 ft with rollator; 03/21/21: 738ft c rollator, 04/24/21: 741ft c rollator; 07/06/2021=900 feet; 08/22/2021= 935 feet with use of Rollator 44/20: 940 ft 11/28/21: 1055 feet with rollator   Time 12    Period Weeks    Status Achieved    Target Date 01/04/22      PT LONG TERM GOAL #5   Title Patient will increase glute medius strength on Left LE from able to lift off 3" (sidelye on mat) to >  5" to improve stability, gait mechanics, and functional strength.    Baseline 10/31: 3-/5; 07/06/2021= 3-/5; 08/22/2021=3-/5 unable to achieve full ROM yet able to withstand some resistance with testing 4/20: unable to obtainfull ROM against gravity 6/6: unable to move against gravity; 01/04/2022= Continues to have difficulty - unable to raise left LE in sidelye against gravity but able to perform supine left Hip abd with some resistance= 3-/5; 03/22/2022- Patient able to raise his left LE 3 in off mat today. 04/03/2022= Patient able to raise left LE 3" off mat in sidelye today; 05/01/2022= 05/01/2022= patient able to raise left LE from sidelye position= 5"; 06/12/2022- Patient presents with 6.25 in sidelye Left hip abd against gravity. 06/26/2022= 7in in sidelye position.    Time 12    Period Weeks    Status MET   Target Date 06/26/2022     PT LONG TERM GOAL #6   Title Patient will increase Left single leg stance time to 15 seconds or greater to increase safety in shower and independence with ADLs.    Baseline 10/31; 1.83 sec without UE support; 08/22/2021=2-3  sec  on left; 6 sec on right 4/20: RLE 10 seconds LLE unable to perform 6/6: R LE 4 seconds during BERG; 01/04/2022= left = 3 sec and right = 8 sec; 02/13/2022= 20 sec on right at best and 4 sec on left LE at best. 03/23/2022=Patient able to stand on left LE at best for 7 sec today. 04/03/2022= 27 sec on right and 12 sec on left; 06/12/2022= 13 sec on  left  and 33 sec at best on right LE; 06/26/2021= 11 sec on left at best and 22 sec on right today; 07/26/2022= 10 sec on left and 20 sec on right today.; 09/04/22: 1-2 seconds on LLE. 27 sec on RLE. 12/04/2022= Will assess next visit   Time 12    Period Weeks    Status On-going    Target Date 03/14/2023     PT LONG TERM GOAL #7   Title Patient will safely negotiate 13 steps with handrails to safely enter/exit mothers house.    Baseline 4/20: very challenging 6/6: able to copmlete safely even managing dogs per pt report; 01/04/2022- Patient performed 16 steps with B Rail-no difficulty other than fatigue.    Time 12    Period Weeks    Status Achieved   Target Date 01/04/22    PT LONG TERM GOAL #8  Title Patient will increase six minute walk test distance to >1100 ft.  for progression to community ambulator and improve gait ability   Baseline 04/03/2022= 920 feet with 4WW - (Added another goal for endurance to continue to progress community amb distances); 05/03/2022= 960 feet with upright 4WW (patient's first time using device): 06/12/2022= 990 feet with 4WW; 06/26/2022= 1010 feet with 4WW; 07/26/2022= 1015 feet; 09/04/22: 1,020'; 09/25/2022= Will reassess next visit. 10/04/2022= 1012ft; 12/06/2022= 900 feet  Time 12   Period Weeks   Status ONGOING  Target Date 9/19//2024       PT LONG TERM GOAL #9  Title Patient will be able to perform functional activities such as vacuuming or using backpack sprayer with modified independence for improved household chore abilities   Baseline 06/26/2022= Patient unable to perform either task but has strong desire to return to performing if able. 07/26/2022- Patient has not attempted to vacuum yet and PT session focusing on LE strengthening.; 09/04/22: Able to perform vacuuming tasks mod-I. Has to break up tasks due to limited  standing tolerance. Has not done outdoor tasks due to weather.  09/25/2022= Patient reports he was successful in vacuuming a few weeks ago- tiring but able  to perform; has not tried spraying or weedeating yet; 4/30: commenced activity simulation in session today. Still unable to perform spraying or edging at home as desired. 12/04/2022= Will reassess next visit; 12/06/2022= Patient reports plans to try before next visit.   Time 12   Period Weeks   Status Ongoing  Target Date 03/14/2023   PT LONG TERM GOAL #10  Title Patient will increase glute medius strength on Left LE from able to lift off 7" (sidelye on mat) to > 10" to improve Hip stability with standing, gait mechanics, and functional strength.   Baseline 06/26/2022= 7in in sidelye position. 07/26/2022- 7.5 in; 09/04/22: 8"; 09/25/2022: deferred; 4/30: deferred ; 12/04/2022= Will assess next visit; 12/06/2022= 5"; 12/25/2022= 8"  Time 12   Period Weeks   Status ONGOING  Target Date 9/19//2024     Plan - 12/13/21 1340     Clinical Impression Statement  Patient returns to clinic after missing last 2+month secondary to loss of his mother. Non-billable visit today as patient stated he needed to stop PT at this time. Did not reassess goals at this time. Patient stated once he relocated he would pursue more orders for PT in his new location.    Personal Factors and Comorbidities Age;Comorbidity 3+;Fitness;Past/Current Experience;Time since onset of injury/illness/exacerbation    Comorbidities aroxysmal a fib, MS, lymphedema, hypothyroidism, Graves Disease, arthritis, lumbar herniated disc (L5), depression and neuropathy    Examination-Activity Limitations Bed Mobility;Bend;Caring for Others;Carry;Reach Overhead;Locomotion Level;Lift;Hygiene/Grooming;Dressing;Squat;Stairs;Stand;Toileting;Transfers    Examination-Participation Restrictions Cleaning;Community Activity;Laundry;Occupation;Shop;Volunteer;Yard Work    Conservation officer, historic buildings Evolving/Moderate complexity    Rehab Potential Fair    PT Frequency 1-2x / week    PT Duration 12 weeks    PT Treatment/Interventions ADLs/Self Care Home  Management;Aquatic Therapy;Canalith Repostioning;Biofeedback;Cryotherapy;Ultrasound;Traction;Moist Heat;Electrical Stimulation;DME Instruction;Gait training;Therapeutic exercise;Therapeutic activities;Functional mobility training;Stair training;Balance training;Neuromuscular re-education;Patient/family education;Manual techniques;Orthotic Fit/Training;Compression bandaging;Passive range of motion;Vestibular;Taping;Splinting;Energy conservation;Dry needling;Visual/perceptual remediation/compensation    PT Next Visit Plan Discharge patient to HEP today.    PT Home Exercise Plan Access Code: [email or web address] URL: [email or web address]  Added Bridges with BTB and encouraged more walking around the home during commerical breaks when watching TV.    Consulted and Agree with Plan of Care Patient              11:33 AM, 04/24/23 Lenda Kelp PT  Physical Therapist - [facility]  Outpatient Physical Therapy- Main Campus [phone]

## 2023-04-25 ENCOUNTER — Ambulatory Visit: Payer: Medicare PPO

## 2023-04-30 ENCOUNTER — Ambulatory Visit: Payer: Medicare PPO

## 2023-05-02 ENCOUNTER — Ambulatory Visit: Payer: Medicare PPO

## 2023-05-07 ENCOUNTER — Ambulatory Visit: Payer: Medicare PPO

## 2023-05-08 ENCOUNTER — Ambulatory Visit: Payer: Medicare PPO | Admitting: Cardiology

## 2023-05-09 ENCOUNTER — Ambulatory Visit: Payer: Medicare PPO

## 2023-05-14 ENCOUNTER — Ambulatory Visit: Payer: Medicare PPO

## 2023-05-16 ENCOUNTER — Ambulatory Visit: Payer: Medicare PPO

## 2023-05-21 ENCOUNTER — Ambulatory Visit: Payer: Medicare PPO

## 2023-05-22 ENCOUNTER — Telehealth: Payer: Self-pay

## 2023-05-22 NOTE — Telephone Encounter (Signed)
Called patient regarding scheduling a repeat colonoscopy. Unable to schedule patient is driving out of town.

## 2023-05-22 NOTE — Telephone Encounter (Signed)
Pt requesting call back to schedule colonoscopy.

## 2023-05-28 ENCOUNTER — Ambulatory Visit: Payer: Medicare PPO

## 2023-05-28 ENCOUNTER — Telehealth: Payer: Self-pay

## 2023-05-28 DIAGNOSIS — Z860100 Personal history of colon polyps, unspecified: Secondary | ICD-10-CM

## 2023-05-28 NOTE — Telephone Encounter (Signed)
Patient lives alone and will need to line up someone to drive him to have his colonoscopy.  Informed him that I will complete his triage, and get cardiac clearance sent to cardiology and he can call me back to schedule with Dr. Servando Snare at [facility] once he has lined up someone to bring him.  Gastroenterology Pre-Procedure Review  Request Date: TBD Requesting Physician: Dr. Servando Snare  PATIENT REVIEW QUESTIONS: The patient responded to the following health history questions as indicated:    1. Are you having any GI issues? no 2. Do you have a personal history of Polyps? yes (last colonoscopy performed by Dr. Servando Snare 07/04/16 recommended repeat in 5 years) 3. Do you have a family history of Colon Cancer or Polyps? no 4. Diabetes Mellitus? no 5. Joint replacements in the past 12 months?no 6. Major health problems in the past 3 months?no 7. Any artificial heart valves, MVP, or defibrillator?no 8. Cardiac conditions? Yes cardiac clearance sent to CV Preop    MEDICATIONS & ALLERGIES:    Patient reports the following regarding taking any anticoagulation/antiplatelet therapy:   Plavix, Coumadin, Eliquis, Xarelto, Lovenox, Pradaxa, Brilinta, or Effient? no Aspirin? yes (Eliquis prescription over seen by Dr. Azucena Cecil)  Patient confirms/reports the following medications:  Current Outpatient Medications  Medication Sig Dispense Refill   apixaban (ELIQUIS) 5 MG TABS tablet Take 1 tablet (5 mg total) by mouth 2 (two) times daily. 180 tablet 1   baclofen (LIORESAL) 10 MG tablet Take 10 mg by mouth 3 (three) times daily.     diltiazem (CARDIZEM SR) 120 MG 12 hr capsule Take 1 capsule (120 mg total) by mouth 2 (two) times daily. 180 capsule 3   Dimethyl Fumarate 240 MG CPDR Take 240 mg by mouth 2 (two) times daily.      furosemide (LASIX) 40 MG tablet Take 1 tablet (40 mg total) by mouth as needed. With your potassium. (Patient not taking: Reported on 05/28/2023) 60 tablet 11   gabapentin (NEURONTIN) 300 MG capsule  Take 300-600 mg by mouth See admin instructions. Take 1 capsule (300 mg) by mouth in the morning, take 1 capsule (300 mg) by mouth at midday, & take 2 capsules (600 mg) by mouth at bedtime.     meloxicam (MOBIC) 15 MG tablet Take 15 mg by mouth daily as needed for pain.  (Patient not taking: Reported on 05/28/2023)     methimazole (TAPAZOLE) 5 MG tablet Take 0.5 tablets by mouth daily.     Multiple Vitamins-Minerals (CENTRUM MINIS MEN 50+ PO) Take by mouth.     potassium chloride (KLOR-CON) 10 MEQ tablet Take 1 tablet (10 mEq total) by mouth as needed. Take with your Lasix. 90 tablet 3   propranolol ER (INDERAL LA) 80 MG 24 hr capsule Take 1 capsule (80 mg total) by mouth 2 (two) times daily. 180 capsule 2   No current facility-administered medications for this visit.    Patient confirms/reports the following allergies:  Allergies  Allergen Reactions   Cephalexin Hives   Ancef [Cefazolin Sodium] Rash   Cefadroxil Rash    No orders of the defined types were placed in this encounter.   AUTHORIZATION INFORMATION Primary Insurance: 1D#: Group #:  Secondary Insurance: 1D#: Group #:  SCHEDULE INFORMATION: Date: TBD Time: Location: [facility]

## 2023-05-29 NOTE — Telephone Encounter (Signed)
Pt needs to arrange transportation prior to scheduling.  Once scheduled he will also need blood thinner advice and cardiac clearance.   Clearance note has been prepared and will be sent when patient calls to schedule.  Thanks, [place], [place]

## 2023-06-04 ENCOUNTER — Ambulatory Visit: Payer: Medicare PPO

## 2023-06-05 ENCOUNTER — Encounter: Payer: Self-pay | Admitting: *Deleted

## 2023-06-07 ENCOUNTER — Telehealth: Payer: Self-pay

## 2023-06-07 NOTE — Telephone Encounter (Signed)
   Pre-operative Risk Assessment    Patient Name: Dustin Gray  DOB: December 16, 1955 MRN: [id number]  Last ov: 09/21/22 Dr. Azucena Cecil  Upcoming ov: 07/10/23 Dr. Lalla Brothers       Request for Surgical Clearance    Procedure:   Colonoscopy   Date of Surgery:  Clearance TBD                                 Surgeon:  Dr. Midge Minium  Surgeon's Group or Practice Name:  [facility]  Phone number:  [phone] Fax number:  [phone]   Type of Clearance Requested:   - Medical  - Pharmacy:  Hold Apixaban (Eliquis) Not indicated   Type of Anesthesia:  General    Additional requests/questions:    Vance Peper   06/07/2023, 5:06 PM

## 2023-06-11 ENCOUNTER — Ambulatory Visit: Payer: Medicare PPO

## 2023-06-11 DIAGNOSIS — Z860101 Personal history of adenomatous and serrated colon polyps: Secondary | ICD-10-CM | POA: Insufficient documentation

## 2023-06-11 DIAGNOSIS — E781 Pure hyperglyceridemia: Secondary | ICD-10-CM | POA: Insufficient documentation

## 2023-06-11 DIAGNOSIS — E782 Mixed hyperlipidemia: Secondary | ICD-10-CM | POA: Insufficient documentation

## 2023-06-11 DIAGNOSIS — G629 Polyneuropathy, unspecified: Secondary | ICD-10-CM | POA: Insufficient documentation

## 2023-06-11 DIAGNOSIS — E7800 Pure hypercholesterolemia, unspecified: Secondary | ICD-10-CM | POA: Insufficient documentation

## 2023-06-11 DIAGNOSIS — I77819 Aortic ectasia, unspecified site: Secondary | ICD-10-CM | POA: Insufficient documentation

## 2023-06-11 DIAGNOSIS — D6859 Other primary thrombophilia: Secondary | ICD-10-CM | POA: Insufficient documentation

## 2023-06-11 NOTE — Telephone Encounter (Signed)
   Name: Dustin Gray  DOB: 1955/08/27  MRN: [id number]  Primary Cardiologist: Debbe Odea, MD   Preoperative team, please contact this patient and set up a phone call appointment for further preoperative risk assessment. Please obtain consent and complete medication review. Thank you for your help.  I confirm that guidance regarding antiplatelet and oral anticoagulation therapy has been completed and, if necessary, noted below.  Per office protocol, patient can hold Eliquis for 2 days prior to procedure.  Please resume Eliquis as soon as possible postprocedure, at the discretion of the surgeon.   I also confirmed the patient resides in the state of [place]. As per [facility] Medical Board telemedicine laws, the patient must reside in the state in which the provider is licensed.   Joylene Grapes, NP 06/11/2023, 1:17 PM [facility]

## 2023-06-11 NOTE — Telephone Encounter (Signed)
Patient with diagnosis of AFib on Eliquis for anticoagulation.    Procedure: colonoscopy Date of procedure: TBD   CHA2DS2-VASc Score = 1  This indicates a 0.6% annual risk of stroke. The patient's score is based upon: CHF History: 0 HTN History: 0 Diabetes History: 0 Stroke History: 0 Vascular Disease History: 0 Age Score: 1 Gender Score: 0       CrCl 106 mL/min using adj body weight Platelet count 251K  Per office protocol, patient can hold Eliquis for 2 days prior to procedure.    **This guidance is not considered finalized until pre-operative APP has relayed final recommendations.**

## 2023-06-11 NOTE — Telephone Encounter (Signed)
Left message to call back to schedule tele pre op appt.  

## 2023-06-12 NOTE — Telephone Encounter (Signed)
Left message x 2 to call back to schedule tele preop appt

## 2023-06-13 NOTE — Telephone Encounter (Signed)
Patient schedule to see Dr. Lalla Brothers on 07/10/23. Appointment notes updated to reflect patients need for preop clearance

## 2023-06-18 NOTE — Telephone Encounter (Signed)
Follow up on cardiac clearance after cardiac preop visit scheduled for 07/10/23.

## 2023-06-20 ENCOUNTER — Ambulatory Visit: Payer: Medicare PPO

## 2023-06-25 ENCOUNTER — Ambulatory Visit: Payer: Medicare PPO

## 2023-06-27 ENCOUNTER — Ambulatory Visit: Payer: Medicare PPO

## 2023-07-02 ENCOUNTER — Ambulatory Visit: Payer: Medicare PPO

## 2023-07-04 ENCOUNTER — Ambulatory Visit: Payer: Medicare PPO

## 2023-07-09 ENCOUNTER — Ambulatory Visit: Payer: Medicare PPO

## 2023-07-09 NOTE — Progress Notes (Deleted)
  Electrophysiology Office Follow up Visit Note:    Date:  07/09/2023   ID:  Dustin Gray, DOB 11-Jul-1955, MRN [id number]  PCP:  Blair Heys, MD (Inactive)  [facility] Cardiologist:  Debbe Odea, MD  [facility] Electrophysiologist:  Lanier Prude, MD    Interval History:     Dustin Gray is a 68 y.o. male who presents for a follow up visit.   I last saw the patient on February 21, 2022 for persistent atrial fibrillation.  His medical history includes multiple sclerosis and chronic systolic heart failure.  At the last appointment he was on Eliquis for stroke prophylaxis.        Past medical, surgical, social and family history were reviewed.  ROS:   Please see the history of present illness.    All other systems reviewed and are negative.  EKGs/Labs/Other Studies Reviewed:    The following studies were reviewed today:   September 21, 2022 EKG shows sinus rhythm, artifact       Physical Exam:    VS:  There were no vitals taken for this visit.    Wt Readings from Last 3 Encounters:  09/21/22 277 lb 9.6 oz (125.9 kg)  03/23/22 264 lb (119.7 kg)  02/21/22 260 lb (117.9 kg)     GEN: no distress CARD: RRR, No MRG RESP: No IWOB. CTAB.      ASSESSMENT:    No diagnosis found. PLAN:    In order of problems listed above:  #Atrial fibrillation Maintaining sinus rhythm On Eliquis for stroke prophylaxis Continue propranolol  #Chronic systolic heart failure NYHA class II.  Last EF 55-60.  Warm and dry on exam. Rhythm control indicated Continue propranolol, Lasix  Follow-up 1 year with EP APP.  Signed, Steffanie Dunn, MD, [facility], [facility] 07/09/2023 7:52 AM    Electrophysiology [facility]

## 2023-07-10 ENCOUNTER — Ambulatory Visit: Payer: Medicare PPO | Admitting: Cardiology

## 2023-07-10 DIAGNOSIS — I5022 Chronic systolic (congestive) heart failure: Secondary | ICD-10-CM

## 2023-07-10 DIAGNOSIS — I4819 Other persistent atrial fibrillation: Secondary | ICD-10-CM

## 2023-07-11 ENCOUNTER — Ambulatory Visit: Payer: Medicare PPO

## 2023-07-16 ENCOUNTER — Ambulatory Visit: Payer: Medicare PPO

## 2023-07-18 ENCOUNTER — Ambulatory Visit: Payer: Medicare PPO

## 2023-07-23 ENCOUNTER — Ambulatory Visit: Payer: Medicare PPO

## 2023-07-25 ENCOUNTER — Ambulatory Visit: Payer: Medicare PPO

## 2023-07-30 ENCOUNTER — Ambulatory Visit: Payer: Medicare PPO

## 2023-08-01 ENCOUNTER — Ambulatory Visit: Payer: Medicare PPO

## 2023-08-05 ENCOUNTER — Telehealth: Payer: Self-pay | Admitting: Cardiology

## 2023-08-05 MED ORDER — APIXABAN 5 MG PO TABS: 5.0000 mg | ORAL_TABLET | Freq: Two times a day (BID) | ORAL | 0 refills | Status: DC

## 2023-08-05 NOTE — Telephone Encounter (Signed)
*  STAT* If patient is at the pharmacy, call can be transferred to refill team.   1. Which medications need to be refilled? (please list name of each medication and dose if known)  apixaban  (ELIQUIS ) 5 MG TABS tablet    2. Would you like to learn more about the convenience, safety, & potential cost savings by using the [facility]?    3. Are you open to using the [facility] (Type [facility].  ).   4. Which pharmacy/location (including street and city if local pharmacy) is medication to be sent to?  [facility] [address], [place] - [address]     5. Do they need a 30 day or 90 day supply? 90 day Patient only has 2 pills left.

## 2023-08-05 NOTE — Telephone Encounter (Signed)
 Prescription refill request for Eliquis  received. Indication: AF Last office visit: 09/21/22  B Agbor-Etang MD Scr: 0.92 on 10/27/21  [facility] Age: 68 Weight: 125.9kg  Based on above findings Eliquis  5mg  twice daily is the appropriate dose.  Pt is past due for lab work.  Requested it be done at upcoming appt with MD 3/25.  Refill approved x 1.

## 2023-08-05 NOTE — Telephone Encounter (Signed)
 Refill Request.

## 2023-08-06 ENCOUNTER — Ambulatory Visit: Payer: Medicare PPO

## 2023-08-08 ENCOUNTER — Ambulatory Visit: Payer: Medicare PPO

## 2023-08-09 NOTE — Telephone Encounter (Signed)
Unable to obtain clearance.  Pt canceled his 07/10/23 preop visit with cardiology.  Thanks, [place], [place]

## 2023-08-13 ENCOUNTER — Ambulatory Visit: Payer: Medicare PPO

## 2023-08-13 NOTE — Telephone Encounter (Signed)
Pt stated that he has his cardiac appt next week.  Informed him I will follow up with him next week to see if we can  move forward with scheduling his colonoscopy.  Thanks, [place], [place]

## 2023-08-15 ENCOUNTER — Ambulatory Visit: Payer: Medicare PPO

## 2023-08-20 ENCOUNTER — Ambulatory Visit: Payer: Medicare PPO

## 2023-08-21 ENCOUNTER — Encounter: Payer: Self-pay | Admitting: Cardiology

## 2023-08-21 ENCOUNTER — Ambulatory Visit: Payer: Medicare PPO | Attending: Cardiology | Admitting: Cardiology

## 2023-08-21 VITALS — BP 112/71 | HR 64 | Ht 74.0 in | Wt 273.6 lb

## 2023-08-21 DIAGNOSIS — I5022 Chronic systolic (congestive) heart failure: Secondary | ICD-10-CM | POA: Diagnosis not present

## 2023-08-21 DIAGNOSIS — I4819 Other persistent atrial fibrillation: Secondary | ICD-10-CM | POA: Diagnosis not present

## 2023-08-21 NOTE — Progress Notes (Signed)
  Electrophysiology Office Follow up Visit Note:    Date:  08/21/2023   ID:  Dustin Gray, DOB Jan 28, 1956, MRN [id number]  PCP:  Dustin Heys, MD (Inactive)  [facility] Cardiologist:  Dustin Odea, MD  [facility] Electrophysiologist:  Dustin Prude, MD    Interval History:     Dustin Gray is a 68 y.o. male who presents for a follow up visit.   I last saw the patient in August 2023.  He has a history of atrial fibrillation, multiple sclerosis and chronic systolic heart failure.  He is on Eliquis for stroke prophylaxis.  He is doing well.  No cardiac complaints at this time.  Is taking Eliquis without missed doses.  He is planning to relocate to [place] given his mom recently passed away.  He is working through her estate now.  He has 4 children who are also involved in that process.      Past medical, surgical, social and family history were reviewed.  ROS:   Please see the history of present illness.    All other systems reviewed and are negative.  EKGs/Labs/Other Studies Reviewed:    The following studies were reviewed today:  March 28, 2022 echo EF 55-60        Physical Exam:    VS:  There were no vitals taken for this visit.    Wt Readings from Last 3 Encounters:  09/21/22 277 lb 9.6 oz (125.9 kg)  03/23/22 264 lb (119.7 kg)  02/21/22 260 lb (117.9 kg)     GEN: no distress CARD: RRR, No MRG RESP: No IWOB. CTAB.      ASSESSMENT:    1. Persistent atrial fibrillation (HCC)   2. Chronic systolic heart failure (HCC)    PLAN:    In order of problems listed above:  #Persistent atrial fibrillation Maintaining sinus rhythm.  Continue diltiazem and Eliquis.  Continue propranolol.  #Chronic systolic heart failure EF now recovered in normal rhythm.  NYHA class II.  Warm and dry on exam.  Continue current medical therapy including Lasix.  #PreOp risk stratification. [name]'s perioperative risk of a major cardiac event  is 0.4% according to the Revised Cardiac Risk Index (RCRI).  Therefore, he is at low risk for perioperative complications.   According to ACC/AHA guidelines, no further cardiovascular testing needed.  The patient may proceed to surgery at acceptable risk.   Eliquis (Apixaban) can be held for 2 days prior to surgery.  Please resume post op when felt to be safe.     Follow-up 6 months with general cardiology.  Follow-up with EP on an as-needed basis.  Signed, Steffanie Dunn, MD, [facility] 08/21/2023 2:57 PM    Electrophysiology [facility]

## 2023-08-21 NOTE — Patient Instructions (Signed)
 Medication Instructions:  Your physician recommends that you continue on your current medications as directed. Please refer to the Current Medication list given to you today.  *If you need a refill on your cardiac medications before your next appointment, please call your pharmacy  Follow-Up: At [facility], you and your health needs are our priority.  As part of our continuing mission to provide you with exceptional heart care, we have created designated Provider Care Teams.  These Care Teams include your primary Cardiologist (physician) and Advanced Practice Providers (APPs -  Physician Assistants and Nurse Practitioners) who all work together to provide you with the care you need, when you need it.  Your next appointment:   As needed with Dr. Lalla Brothers  6 months with Dr. Azucena Cecil

## 2023-08-22 ENCOUNTER — Ambulatory Visit: Payer: Medicare PPO

## 2023-08-26 NOTE — Telephone Encounter (Signed)
 Clearance for colonoscopy. Noted below from heartcare office visit 08/21/23 with Dr. Lalla Brothers.  #PreOp risk stratification. Mr. Puckett's perioperative risk of a major cardiac event is 0.4% according to the Revised Cardiac Risk Index (RCRI).  Therefore, he is at low risk for perioperative complications.   According to ACC/AHA guidelines, no further cardiovascular testing needed.  The patient may proceed to surgery at acceptable risk.   Eliquis (Apixaban) can be held for 2 days prior to surgery.  Please resume post op when felt to be safe.

## 2023-08-27 ENCOUNTER — Telehealth: Payer: Self-pay | Admitting: Cardiology

## 2023-08-27 ENCOUNTER — Ambulatory Visit: Payer: Medicare PPO

## 2023-08-27 MED ORDER — PROPRANOLOL HCL ER 80 MG PO CP24: 80.0000 mg | ORAL_CAPSULE | Freq: Two times a day (BID) | ORAL | 2 refills | Status: DC

## 2023-08-27 NOTE — Telephone Encounter (Signed)
 Requested Prescriptions   Signed Prescriptions Disp Refills   propranolol ER (INDERAL LA) 80 MG 24 hr capsule 180 capsule 2    Sig: Take 1 capsule (80 mg total) by mouth 2 (two) times daily.    Authorizing Provider: Debbe Odea    Ordering User: Kendrick Fries

## 2023-08-27 NOTE — Telephone Encounter (Signed)
*  STAT* If patient is at the pharmacy, call can be transferred to refill team.   1. Which medications need to be refilled? (please list name of each medication and dose if known) propranolol ER (INDERAL LA) 80 MG 24 hr capsule   2. Which pharmacy/location (including street and city if local pharmacy) is medication to be sent to?  [facility] [address], [place] - [address]    3. Do they need a 30 day or 90 day supply? 87   States he just saw Dr. Lalla Brothers who told he did not need to be seen currently by Dr. Azucena Cecil for this medication. Will call back to schedule at a later time.

## 2023-08-29 ENCOUNTER — Ambulatory Visit: Payer: Medicare PPO

## 2023-09-03 ENCOUNTER — Ambulatory Visit: Payer: Medicare PPO

## 2023-09-05 ENCOUNTER — Ambulatory Visit: Payer: Medicare PPO

## 2023-09-10 ENCOUNTER — Ambulatory Visit: Payer: Medicare PPO

## 2023-09-12 ENCOUNTER — Ambulatory Visit: Payer: Medicare PPO

## 2023-09-20 ENCOUNTER — Ambulatory Visit: Payer: Medicare PPO | Admitting: Cardiology

## 2023-10-30 ENCOUNTER — Other Ambulatory Visit: Payer: Self-pay

## 2023-10-30 MED ORDER — APIXABAN 5 MG PO TABS: 5.0000 mg | ORAL_TABLET | Freq: Two times a day (BID) | ORAL | 1 refills | Status: DC

## 2023-10-30 NOTE — Telephone Encounter (Signed)
 Prescription refill request for Eliquis  received. Indication:afib Last office visit:2/25 Scr:0.92  5/24 Age: 68 Weight:124.1  kg  Prescription refilled

## 2023-10-31 ENCOUNTER — Telehealth: Payer: Self-pay | Admitting: Cardiology

## 2023-10-31 DIAGNOSIS — I4819 Other persistent atrial fibrillation: Secondary | ICD-10-CM

## 2023-10-31 MED ORDER — APIXABAN 5 MG PO TABS: 5.0000 mg | ORAL_TABLET | Freq: Two times a day (BID) | ORAL | 0 refills | Status: DC

## 2023-10-31 NOTE — Telephone Encounter (Signed)
 Eliquis  5mg  refill request received. Patient is 68 years old, weight-124.1kg, Crea-0.92 on 10/27/21-needs labs, Diagnosis-Afib, and last seen by Dr. Marven Slimmer on 08/21/23. Dose is appropriate based on dosing criteria.   Pt needs updated labs

## 2023-10-31 NOTE — Telephone Encounter (Signed)
 Sent in a 2 weeks supply of Eliquis  so pt does not run out. Pt needs to come in for blood.

## 2023-10-31 NOTE — Telephone Encounter (Signed)
*  STAT* If patient is at the pharmacy, call can be transferred to refill team.   1. Which medications need to be refilled? (please list name of each medication and dose if known) apixaban  (ELIQUIS ) 5 MG TABS tablet   2. Which pharmacy/location (including street and city if local pharmacy) is medication to be sent to?  [facility] [address] [place] - [address]      3. Do they need a 30 day or 90 day supply? 90 day   Pt is out of medication

## 2023-10-31 NOTE — Telephone Encounter (Signed)
 Called and [facility]

## 2023-12-12 ENCOUNTER — Other Ambulatory Visit: Payer: Self-pay | Admitting: Cardiology

## 2024-02-11 ENCOUNTER — Encounter: Payer: Self-pay | Admitting: *Deleted

## 2024-02-17 ENCOUNTER — Encounter: Payer: Self-pay | Admitting: Cardiology

## 2024-02-17 ENCOUNTER — Ambulatory Visit: Attending: Cardiology | Admitting: Cardiology

## 2024-02-17 VITALS — BP 130/77 | HR 105 | Ht 74.0 in | Wt 283.2 lb

## 2024-02-17 DIAGNOSIS — I4891 Unspecified atrial fibrillation: Secondary | ICD-10-CM

## 2024-02-17 DIAGNOSIS — E0590 Thyrotoxicosis, unspecified without thyrotoxic crisis or storm: Secondary | ICD-10-CM

## 2024-02-17 DIAGNOSIS — I429 Cardiomyopathy, unspecified: Secondary | ICD-10-CM

## 2024-02-17 NOTE — Progress Notes (Signed)
 Cardiology Office Note:    Date:  02/17/2024   ID:  Dustin Gray, DOB 10-12-1955, MRN [id number]  PCP:  Hugh Charleston, MD (Inactive)  Cardiologist:  Redell Cave, MD  Electrophysiologist:  OLE ONEIDA HOLTS, MD   Referring MD: No ref. provider found   Chief Complaint  Patient presents with   Follow-up    6 month follow up pt has been doing well with no complaints of chest pain, chest pressure or SOB, medciation reviewed verbally with patient    History of Present Illness:    Dustin Gray is a 68 y.o. male with a hx of persistent atrial fibrillation s/p DCCV 5/23, multiple sclerosis, lymphedema, hyperthyroidism/Graves' disease, who presents for follow-up.   Doing okay, denies palpitations or dizziness.  Compliant with Eliquis  as prescribed, denies any bleeding issues.  Thyroid  function testing 3 months ago was within normal limits.  Has no concerns at this time.   Prior notes/studies Echo 10/23 EF 55 to 60% Echo 08/2021 EF 40 to 45% Patient originally seen in the hospital for atrial fibrillation with rapid ventricular response.  He was managed with diltiazem  and Eliquis .  He was subsequently diagnosed with hyperthyroidism and propanolol ER added to his medical regimen.   Echocardiogram on 09/2019 showed normal ejection fraction with EF 55 to 60%, left atrial size was also normal.   He was started on anticoagulation due to plan for cardioversion.  Cardioversion was attempted but unsuccessful.  His heart rates have improved when hyperthyroidism is controlled.  Past Medical History:  Diagnosis Date   Abscess    groin   Arthritis    lower spine   Erectile dysfunction    Low testosterone    Lumbar herniated disc    L5   Multiple sclerosis (HCC)    Staph aureus infection     Past Surgical History:  Procedure Laterality Date   CARDIOVERSION N/A 11/22/2021   Procedure: CARDIOVERSION;  Surgeon: Cave Redell, MD;  Location: [facility];  Service: Cardiovascular;   Laterality: N/A;   COLONOSCOPY WITH PROPOFOL  N/A 07/04/2016   Procedure: COLONOSCOPY WITH PROPOFOL ;  Surgeon: Rogelia Copping, MD;  Location: [facility];  Service: Endoscopy;  Laterality: N/A;   FINGER SURGERY  1964   MASS EXCISION  1960   POLYPECTOMY  07/04/2016   Procedure: POLYPECTOMY;  Surgeon: Rogelia Copping, MD;  Location: [facility];  Service: Endoscopy;;   TONSILLECTOMY      Current Medications: Current Meds  Medication Sig   apixaban  (ELIQUIS ) 5 MG TABS tablet Take 1 tablet (5 mg total) by mouth 2 (two) times daily. 2 week supply pt needs to come in for blood work.   atorvastatin (LIPITOR) 20 MG tablet Take 20 mg by mouth daily.   baclofen  (LIORESAL ) 10 MG tablet Take 10 mg by mouth 3 (three) times daily.   diltiazem  (CARDIZEM  CD) 120 MG 24 hr capsule Take 1 capsule by mouth twice daily   Dimethyl Fumarate  240 MG CPDR Take 240 mg by mouth 2 (two) times daily.    furosemide  (LASIX ) 40 MG tablet Take 1 tablet (40 mg total) by mouth as needed. With your potassium.   gabapentin  (NEURONTIN ) 300 MG capsule Take 300-600 mg by mouth See admin instructions. Take 1 capsule (300 mg) by mouth in the morning, take 1 capsule (300 mg) by mouth at midday, & take 2 capsules (600 mg) by mouth at bedtime.   meloxicam (MOBIC) 15 MG tablet Take 15 mg by mouth daily as needed for pain.  methimazole  (TAPAZOLE ) 5 MG tablet Take 0.5 tablets by mouth daily.   Multiple Vitamins-Minerals (CENTRUM MINIS MEN 50+ PO) Take by mouth.   potassium chloride  (KLOR-CON ) 10 MEQ tablet Take 1 tablet (10 mEq total) by mouth as needed. Take with your Lasix .   propranolol  ER (INDERAL  LA) 80 MG 24 hr capsule Take 1 capsule (80 mg total) by mouth 2 (two) times daily.     Allergies:   Cephalexin, Ancef [cefazolin sodium], and Cefadroxil   Social History   Socioeconomic History   Marital status: Legally Separated    Spouse name: Not on file   Number of children: Not on file   Years of education: Not on file    Highest education level: Not on file  Occupational History   Not on file  Tobacco Use   Smoking status: Former   Smokeless tobacco: Never   Tobacco comments:    smoked as teenager  Vaping Use   Vaping status: Never Used  Substance and Sexual Activity   Alcohol use: Not Currently    Comment: 2 drinks/month   Drug use: No   Sexual activity: Not on file  Other Topics Concern   Not on file  Social History Narrative   Not on file   Social Drivers of Health   Financial Resource Strain: Not on file  Food Insecurity: Not on file  Transportation Needs: Not on file  Physical Activity: Not on file  Stress: Not on file  Social Connections: Not on file     Family History: The patient's family history includes Diabetes in his father.  ROS:   Please see the history of present illness.     All other systems reviewed and are negative.  EKGs/Labs/Other Studies Reviewed:    The following studies were reviewed today:   EKG Interpretation Date/Time:  Monday February 17 2024 10:12:09 EDT Ventricular Rate:  105 PR Interval:    QRS Duration:  74 QT Interval:  332 QTC Calculation: 438 R Axis:   -13  Text Interpretation: Atrial fibrillation with rapid ventricular response Septal infarct , age undetermined Confirmed by Darliss Rogue ([id number]) on 02/17/2024 10:22:00 AM    Recent Labs: No results found for requested labs within last 365 days.  Recent Lipid Panel    Component Value Date/Time   CHOL 108 10/01/2019 0358   TRIG 81 10/01/2019 0358   HDL 35 (L) 10/01/2019 0358   CHOLHDL 3.1 10/01/2019 0358   VLDL 16 10/01/2019 0358   LDLCALC 57 10/01/2019 0358    Physical Exam:    VS:  BP 130/77 (BP Location: Left Arm, Patient Position: Sitting, Cuff Size: Normal)   Pulse (!) 105   Ht 6' 2 (1.88 m)   Wt 283 lb 3.2 oz (128.5 kg)   SpO2 98%   BMI 36.36 kg/m     Wt Readings from Last 3 Encounters:  02/17/24 283 lb 3.2 oz (128.5 kg)  08/21/23 273 lb 9.6 oz (124.1 kg)   09/21/22 277 lb 9.6 oz (125.9 kg)     GEN:  Well nourished, well developed in no acute distress HEENT: Normal NECK: No JVD; No carotid bruits CARDIAC: Regular rate and rhythm RESPIRATORY:  Clear to auscultation without rales, wheezing or rhonchi  ABDOMEN: Soft, non-tender, non-distended MUSCULOSKELETAL:  no edema; No deformity  SKIN: Warm and dry NEUROLOGIC:  Alert and oriented x 3 PSYCHIATRIC:  Normal affect   ASSESSMENT:    1. Atrial fibrillation with RVR (HCC)   2. Cardiomyopathy, unspecified type (HCC)  3. Hyperthyroidism    PLAN:    In order of problems listed above:  Persistent atrial fibrillation.  S/p DC cardioversion 11/22/2021.  EKG today showing A-fib heart rate 105.SABRA Continue Cardizem  CD 120 mg twice daily, continue propranolol  ER 80 mg twice daily, Eliquis  5 mg twice daily.  Has not missed Eliquis  dosing, plan to DC cardioversion next week, previous cardiomyopathy was associated with patient being out of rhythm/A-fib.  Thyroid  function within normal limits.  Referred to EP. Cardiomyopathy (initial EF 40 to 45%, echo 10/23 EF 55-60%).  Likely tachycardia/A-fib induced.  Continue Cardizem  and Inderal . hyperthyroidism.  On methimazole . management as per endocrinology.  Follow-up in 6-8 weeks.  Informed Consent   Shared Decision Making/Informed Consent The risks (stroke, cardiac arrhythmias rarely resulting in the need for a temporary or permanent pacemaker, skin irritation or burns and complications associated with conscious sedation including aspiration, arrhythmia, respiratory failure and death), benefits (restoration of normal sinus rhythm) and alternatives of a direct current cardioversion were explained in detail to Mr. Wiers and he agrees to proceed.          Medication Adjustments/Labs and Tests Ordered: Current medicines are reviewed at length with the patient today.  Concerns regarding medicines are outlined above.  Orders Placed This Encounter   Procedures   Basic metabolic panel with GFR   CBC   EKG 12-Lead     No orders of the defined types were placed in this encounter.   Patient Instructions  Medication Instructions:  No changes *If you need a refill on your cardiac medications before your next appointment, please call your pharmacy*  Lab Work: Your provider would like for you to have the following labs today: BMET and CBC  If you have labs (blood work) drawn today and your tests are completely normal, you will receive your results only by: MyChart Message (if you have MyChart) OR A paper copy in the mail If you have any lab test that is abnormal or we need to change your treatment, we will call you to review the results.  Follow-Up: At [facility], you and your health needs are our priority.  As part of our continuing mission to provide you with exceptional heart care, our providers are all part of one team.  This team includes your primary Cardiologist (physician) and Advanced Practice Providers or APPs (Physician Assistants and Nurse Practitioners) who all work together to provide you with the care you need, when you need it.  Your next appointment:   2 month(s)  Provider:   You may see Redell Cave, MD or one of the following Advanced Practice Providers on your designated Care Team:   Lonni Meager, NP Lesley Maffucci, PA-C Bernardino Bring, PA-C Cadence Franchester, PA-C Tylene Lunch, NP Barnie Hila, NP   [facility] your appointment with Dr. Cindie on 03/25/2024 at 3:20.  We recommend signing up for the patient portal called MyChart.  Sign up information is provided on this After Visit Summary.  MyChart is used to connect with patients for Virtual Visits (Telemedicine).  Patients are able to view lab/test results, encounter notes, upcoming appointments, etc.  Non-urgent messages can be sent to your provider as well.   To learn more about what you can do with MyChart, go to [email or web address].    Other Instructions     Dear Laurel SHAUNNA Flake  You are scheduled for a Cardioversion on Friday, September 5 with Dr. Cave.  Please arrive at the [facility] Entrance of [facility],  [address] [place], [place] [address] at 6:30 AM (This is 1 hour(s) prior to your procedure time).  Proceed to the Check-In Desk directly inside the entrance.  Procedure Parking: Use the entrance off of the [address] side of the hospital. Turn right upon entering and follow the driveway to parking that is directly in front of the Heart & Vascular Center. There is no valet parking available at this entrance, however there is an awning directly in front of the Heart & Vascular Center for drop off/ pick up for patients.    DIET:  Nothing to eat or drink after midnight except a sip of water  with medications (see medication instructions below)  MEDICATION INSTRUCTIONS: !!IF ANY NEW MEDICATIONS ARE STARTED AFTER TODAY, PLEASE NOTIFY YOUR PROVIDER AS SOON AS POSSIBLE!!  FYI: Medications such as Semaglutide (Ozempic, [place]), Tirzepatide (Mounjaro, Zepbound), Dulaglutide (Trulicity), etc (GLP1 agonists) AND Canagliflozin (Invokana), Dapagliflozin (Farxiga), Empagliflozin (Jardiance), Ertugliflozin (Steglatro), Bexagliflozin [employer]) or any combination with one of these drugs such as Invokamet (Canagliflozin/Metformin), Synjardy (Empagliflozin/Metformin), etc (SGLT2 inhibitors) must be held around the time of a procedure. This is not a comprehensive list of all of these drugs. Please review all of your medications and talk to your provider if you take any one of these. If you are not sure, ask your provider.   Medications:        1. Continue taking your anticoagulant (blood thinner): Apixaban  (Eliquis ).  You will need to continue this after your procedure until you are told by your provider that it is safe to stop.  If you miss a dose, please call the office.        2. Hold the Furosemide  the morning  of the procedure.    LABS:  FYI:  For your safety, and to allow us  to monitor your vital signs accurately during the surgery/procedure we request: If you have artificial nails, gel coating, SNS etc, please have those removed prior to your surgery/procedure. Not having the nail coverings /polish removed may result in cancellation or delay of your surgery/procedure.  Your support person will be asked to wait in the waiting room during your procedure.  It is OK to have someone drop you off and come back when you are ready to be discharged.  You cannot drive after the procedure and will need someone to drive you home.  Bring your insurance cards.  *Special Note: Every effort is made to have your procedure done on time. Occasionally there are emergencies that occur at the hospital that may cause delays. Please be patient if a delay does occur.           Signed, Redell Cave, MD  02/17/2024 11:09 AM    [facility]

## 2024-02-17 NOTE — H&P (View-Only) (Signed)
 Cardiology Office Note:    Date:  02/17/2024   ID:  Dustin Gray, DOB 10-12-1955, MRN [id number]  PCP:  Hugh Charleston, MD (Inactive)  Cardiologist:  Redell Cave, MD  Electrophysiologist:  OLE ONEIDA HOLTS, MD   Referring MD: No ref. provider found   Chief Complaint  Patient presents with   Follow-up    6 month follow up pt has been doing well with no complaints of chest pain, chest pressure or SOB, medciation reviewed verbally with patient    History of Present Illness:    Dustin Gray is a 68 y.o. male with a hx of persistent atrial fibrillation s/p DCCV 5/23, multiple sclerosis, lymphedema, hyperthyroidism/Graves' disease, who presents for follow-up.   Doing okay, denies palpitations or dizziness.  Compliant with Eliquis  as prescribed, denies any bleeding issues.  Thyroid  function testing 3 months ago was within normal limits.  Has no concerns at this time.   Prior notes/studies Echo 10/23 EF 55 to 60% Echo 08/2021 EF 40 to 45% Patient originally seen in the hospital for atrial fibrillation with rapid ventricular response.  He was managed with diltiazem  and Eliquis .  He was subsequently diagnosed with hyperthyroidism and propanolol ER added to his medical regimen.   Echocardiogram on 09/2019 showed normal ejection fraction with EF 55 to 60%, left atrial size was also normal.   He was started on anticoagulation due to plan for cardioversion.  Cardioversion was attempted but unsuccessful.  His heart rates have improved when hyperthyroidism is controlled.  Past Medical History:  Diagnosis Date   Abscess    groin   Arthritis    lower spine   Erectile dysfunction    Low testosterone    Lumbar herniated disc    L5   Multiple sclerosis (HCC)    Staph aureus infection     Past Surgical History:  Procedure Laterality Date   CARDIOVERSION N/A 11/22/2021   Procedure: CARDIOVERSION;  Surgeon: Cave Redell, MD;  Location: [facility];  Service: Cardiovascular;   Laterality: N/A;   COLONOSCOPY WITH PROPOFOL  N/A 07/04/2016   Procedure: COLONOSCOPY WITH PROPOFOL ;  Surgeon: Rogelia Copping, MD;  Location: [facility];  Service: Endoscopy;  Laterality: N/A;   FINGER SURGERY  1964   MASS EXCISION  1960   POLYPECTOMY  07/04/2016   Procedure: POLYPECTOMY;  Surgeon: Rogelia Copping, MD;  Location: [facility];  Service: Endoscopy;;   TONSILLECTOMY      Current Medications: Current Meds  Medication Sig   apixaban  (ELIQUIS ) 5 MG TABS tablet Take 1 tablet (5 mg total) by mouth 2 (two) times daily. 2 week supply pt needs to come in for blood work.   atorvastatin (LIPITOR) 20 MG tablet Take 20 mg by mouth daily.   baclofen  (LIORESAL ) 10 MG tablet Take 10 mg by mouth 3 (three) times daily.   diltiazem  (CARDIZEM  CD) 120 MG 24 hr capsule Take 1 capsule by mouth twice daily   Dimethyl Fumarate  240 MG CPDR Take 240 mg by mouth 2 (two) times daily.    furosemide  (LASIX ) 40 MG tablet Take 1 tablet (40 mg total) by mouth as needed. With your potassium.   gabapentin  (NEURONTIN ) 300 MG capsule Take 300-600 mg by mouth See admin instructions. Take 1 capsule (300 mg) by mouth in the morning, take 1 capsule (300 mg) by mouth at midday, & take 2 capsules (600 mg) by mouth at bedtime.   meloxicam (MOBIC) 15 MG tablet Take 15 mg by mouth daily as needed for pain.  methimazole  (TAPAZOLE ) 5 MG tablet Take 0.5 tablets by mouth daily.   Multiple Vitamins-Minerals (CENTRUM MINIS MEN 50+ PO) Take by mouth.   potassium chloride  (KLOR-CON ) 10 MEQ tablet Take 1 tablet (10 mEq total) by mouth as needed. Take with your Lasix .   propranolol  ER (INDERAL  LA) 80 MG 24 hr capsule Take 1 capsule (80 mg total) by mouth 2 (two) times daily.     Allergies:   Cephalexin, Ancef [cefazolin sodium], and Cefadroxil   Social History   Socioeconomic History   Marital status: Legally Separated    Spouse name: Not on file   Number of children: Not on file   Years of education: Not on file    Highest education level: Not on file  Occupational History   Not on file  Tobacco Use   Smoking status: Former   Smokeless tobacco: Never   Tobacco comments:    smoked as teenager  Vaping Use   Vaping status: Never Used  Substance and Sexual Activity   Alcohol use: Not Currently    Comment: 2 drinks/month   Drug use: No   Sexual activity: Not on file  Other Topics Concern   Not on file  Social History Narrative   Not on file   Social Drivers of Health   Financial Resource Strain: Not on file  Food Insecurity: Not on file  Transportation Needs: Not on file  Physical Activity: Not on file  Stress: Not on file  Social Connections: Not on file     Family History: The patient's family history includes Diabetes in his father.  ROS:   Please see the history of present illness.     All other systems reviewed and are negative.  EKGs/Labs/Other Studies Reviewed:    The following studies were reviewed today:   EKG Interpretation Date/Time:  Monday February 17 2024 10:12:09 EDT Ventricular Rate:  105 PR Interval:    QRS Duration:  74 QT Interval:  332 QTC Calculation: 438 R Axis:   -13  Text Interpretation: Atrial fibrillation with rapid ventricular response Septal infarct , age undetermined Confirmed by Darliss Rogue ([id number]) on 02/17/2024 10:22:00 AM    Recent Labs: No results found for requested labs within last 365 days.  Recent Lipid Panel    Component Value Date/Time   CHOL 108 10/01/2019 0358   TRIG 81 10/01/2019 0358   HDL 35 (L) 10/01/2019 0358   CHOLHDL 3.1 10/01/2019 0358   VLDL 16 10/01/2019 0358   LDLCALC 57 10/01/2019 0358    Physical Exam:    VS:  BP 130/77 (BP Location: Left Arm, Patient Position: Sitting, Cuff Size: Normal)   Pulse (!) 105   Ht 6' 2 (1.88 m)   Wt 283 lb 3.2 oz (128.5 kg)   SpO2 98%   BMI 36.36 kg/m     Wt Readings from Last 3 Encounters:  02/17/24 283 lb 3.2 oz (128.5 kg)  08/21/23 273 lb 9.6 oz (124.1 kg)   09/21/22 277 lb 9.6 oz (125.9 kg)     GEN:  Well nourished, well developed in no acute distress HEENT: Normal NECK: No JVD; No carotid bruits CARDIAC: Regular rate and rhythm RESPIRATORY:  Clear to auscultation without rales, wheezing or rhonchi  ABDOMEN: Soft, non-tender, non-distended MUSCULOSKELETAL:  no edema; No deformity  SKIN: Warm and dry NEUROLOGIC:  Alert and oriented x 3 PSYCHIATRIC:  Normal affect   ASSESSMENT:    1. Atrial fibrillation with RVR (HCC)   2. Cardiomyopathy, unspecified type (HCC)  3. Hyperthyroidism    PLAN:    In order of problems listed above:  Persistent atrial fibrillation.  S/p DC cardioversion 11/22/2021.  EKG today showing A-fib heart rate 105.SABRA Continue Cardizem  CD 120 mg twice daily, continue propranolol  ER 80 mg twice daily, Eliquis  5 mg twice daily.  Has not missed Eliquis  dosing, plan to DC cardioversion next week, previous cardiomyopathy was associated with patient being out of rhythm/A-fib.  Thyroid  function within normal limits.  Referred to EP. Cardiomyopathy (initial EF 40 to 45%, echo 10/23 EF 55-60%).  Likely tachycardia/A-fib induced.  Continue Cardizem  and Inderal . hyperthyroidism.  On methimazole . management as per endocrinology.  Follow-up in 6-8 weeks.  Informed Consent   Shared Decision Making/Informed Consent The risks (stroke, cardiac arrhythmias rarely resulting in the need for a temporary or permanent pacemaker, skin irritation or burns and complications associated with conscious sedation including aspiration, arrhythmia, respiratory failure and death), benefits (restoration of normal sinus rhythm) and alternatives of a direct current cardioversion were explained in detail to Mr. Wiers and he agrees to proceed.          Medication Adjustments/Labs and Tests Ordered: Current medicines are reviewed at length with the patient today.  Concerns regarding medicines are outlined above.  Orders Placed This Encounter   Procedures   Basic metabolic panel with GFR   CBC   EKG 12-Lead     No orders of the defined types were placed in this encounter.   Patient Instructions  Medication Instructions:  No changes *If you need a refill on your cardiac medications before your next appointment, please call your pharmacy*  Lab Work: Your provider would like for you to have the following labs today: BMET and CBC  If you have labs (blood work) drawn today and your tests are completely normal, you will receive your results only by: MyChart Message (if you have MyChart) OR A paper copy in the mail If you have any lab test that is abnormal or we need to change your treatment, we will call you to review the results.  Follow-Up: At [facility], you and your health needs are our priority.  As part of our continuing mission to provide you with exceptional heart care, our providers are all part of one team.  This team includes your primary Cardiologist (physician) and Advanced Practice Providers or APPs (Physician Assistants and Nurse Practitioners) who all work together to provide you with the care you need, when you need it.  Your next appointment:   2 month(s)  Provider:   You may see Redell Cave, MD or one of the following Advanced Practice Providers on your designated Care Team:   Lonni Meager, NP Lesley Maffucci, PA-C Bernardino Bring, PA-C Cadence Franchester, PA-C Tylene Lunch, NP Barnie Hila, NP   [facility] your appointment with Dr. Cindie on 03/25/2024 at 3:20.  We recommend signing up for the patient portal called MyChart.  Sign up information is provided on this After Visit Summary.  MyChart is used to connect with patients for Virtual Visits (Telemedicine).  Patients are able to view lab/test results, encounter notes, upcoming appointments, etc.  Non-urgent messages can be sent to your provider as well.   To learn more about what you can do with MyChart, go to [email or web address].    Other Instructions     Dear Laurel SHAUNNA Flake  You are scheduled for a Cardioversion on Friday, September 5 with Dr. Cave.  Please arrive at the Heart & Vascular Center Entrance of [facility],  [address] [place], [place] [address] at 6:30 AM (This is 1 hour(s) prior to your procedure time).  Proceed to the Check-In Desk directly inside the entrance.  Procedure Parking: Use the entrance off of the [address] side of the hospital. Turn right upon entering and follow the driveway to parking that is directly in front of the Heart & Vascular Center. There is no valet parking available at this entrance, however there is an awning directly in front of the Heart & Vascular Center for drop off/ pick up for patients.    DIET:  Nothing to eat or drink after midnight except a sip of water  with medications (see medication instructions below)  MEDICATION INSTRUCTIONS: !!IF ANY NEW MEDICATIONS ARE STARTED AFTER TODAY, PLEASE NOTIFY YOUR PROVIDER AS SOON AS POSSIBLE!!  FYI: Medications such as Semaglutide (Ozempic, [place]), Tirzepatide (Mounjaro, Zepbound), Dulaglutide (Trulicity), etc (GLP1 agonists) AND Canagliflozin (Invokana), Dapagliflozin (Farxiga), Empagliflozin (Jardiance), Ertugliflozin (Steglatro), Bexagliflozin [employer]) or any combination with one of these drugs such as Invokamet (Canagliflozin/Metformin), Synjardy (Empagliflozin/Metformin), etc (SGLT2 inhibitors) must be held around the time of a procedure. This is not a comprehensive list of all of these drugs. Please review all of your medications and talk to your provider if you take any one of these. If you are not sure, ask your provider.   Medications:        1. Continue taking your anticoagulant (blood thinner): Apixaban  (Eliquis ).  You will need to continue this after your procedure until you are told by your provider that it is safe to stop.  If you miss a dose, please call the office.        2. Hold the Furosemide  the morning  of the procedure.    LABS:  FYI:  For your safety, and to allow us  to monitor your vital signs accurately during the surgery/procedure we request: If you have artificial nails, gel coating, SNS etc, please have those removed prior to your surgery/procedure. Not having the nail coverings /polish removed may result in cancellation or delay of your surgery/procedure.  Your support person will be asked to wait in the waiting room during your procedure.  It is OK to have someone drop you off and come back when you are ready to be discharged.  You cannot drive after the procedure and will need someone to drive you home.  Bring your insurance cards.  *Special Note: Every effort is made to have your procedure done on time. Occasionally there are emergencies that occur at the hospital that may cause delays. Please be patient if a delay does occur.           Signed, Redell Cave, MD  02/17/2024 11:09 AM    [facility]

## 2024-02-17 NOTE — Patient Instructions (Addendum)
 Medication Instructions:  No changes *If you need a refill on your cardiac medications before your next appointment, please call your pharmacy*  Lab Work: Your provider would like for you to have the following labs today: BMET and CBC  If you have labs (blood work) drawn today and your tests are completely normal, you will receive your results only by: MyChart Message (if you have MyChart) OR A paper copy in the mail If you have any lab test that is abnormal or we need to change your treatment, we will call you to review the results.  Follow-Up: At [facility], you and your health needs are our priority.  As part of our continuing mission to provide you with exceptional heart care, our providers are all part of one team.  This team includes your primary Cardiologist (physician) and Advanced Practice Providers or APPs (Physician Assistants and Nurse Practitioners) who all work together to provide you with the care you need, when you need it.  Your next appointment:   2 month(s)  Provider:   You may see Redell Cave, MD or one of the following Advanced Practice Providers on your designated Care Team:   Lonni Meager, NP Lesley Maffucci, PA-C Bernardino Bring, PA-C Cadence Franchester, PA-C Tylene Lunch, NP Barnie Hila, NP   [facility] your appointment with Dr. Cindie on 03/25/2024 at 3:20.  We recommend signing up for the patient portal called MyChart.  Sign up information is provided on this After Visit Summary.  MyChart is used to connect with patients for Virtual Visits (Telemedicine).  Patients are able to view lab/test results, encounter notes, upcoming appointments, etc.  Non-urgent messages can be sent to your provider as well.   To learn more about what you can do with MyChart, go to [email or web address].   Other Instructions     Dear Dustin Gray  You are scheduled for a Cardioversion on Friday, September 5 with Dr. Cave.  Please arrive at the Heart &  Vascular Center Entrance of [facility], [address], [place] [address] at 6:30 AM (This is 1 hour(s) prior to your procedure time).  Proceed to the Check-In Desk directly inside the entrance.  Procedure Parking: Use the entrance off of the [address] side of the hospital. Turn right upon entering and follow the driveway to parking that is directly in front of the Heart & Vascular Center. There is no valet parking available at this entrance, however there is an awning directly in front of the Heart & Vascular Center for drop off/ pick up for patients.    DIET:  Nothing to eat or drink after midnight except a sip of water  with medications (see medication instructions below)  MEDICATION INSTRUCTIONS: !!IF ANY NEW MEDICATIONS ARE STARTED AFTER TODAY, PLEASE NOTIFY YOUR PROVIDER AS SOON AS POSSIBLE!!  FYI: Medications such as Semaglutide (Ozempic, [place]), Tirzepatide (Mounjaro, Zepbound), Dulaglutide (Trulicity), etc (GLP1 agonists) AND Canagliflozin (Invokana), Dapagliflozin (Farxiga), Empagliflozin (Jardiance), Ertugliflozin (Steglatro), Bexagliflozin [employer]) or any combination with one of these drugs such as Invokamet (Canagliflozin/Metformin), Synjardy (Empagliflozin/Metformin), etc (SGLT2 inhibitors) must be held around the time of a procedure. This is not a comprehensive list of all of these drugs. Please review all of your medications and talk to your provider if you take any one of these. If you are not sure, ask your provider.   Medications:        1. Continue taking your anticoagulant (blood thinner): Apixaban  (Eliquis ).  You will need to continue this after your  procedure until you are told by your provider that it is safe to stop.  If you miss a dose, please call the office.        2. Hold the Furosemide  the morning of the procedure.    LABS:  FYI:  For your safety, and to allow us  to monitor your vital signs accurately during the surgery/procedure we request: If you have  artificial nails, gel coating, SNS etc, please have those removed prior to your surgery/procedure. Not having the nail coverings /polish removed may result in cancellation or delay of your surgery/procedure.  Your support person will be asked to wait in the waiting room during your procedure.  It is OK to have someone drop you off and come back when you are ready to be discharged.  You cannot drive after the procedure and will need someone to drive you home.  Bring your insurance cards.  *Special Note: Every effort is made to have your procedure done on time. Occasionally there are emergencies that occur at the hospital that may cause delays. Please be patient if a delay does occur.

## 2024-02-18 LAB — BASIC METABOLIC PANEL WITH GFR
BUN/Creatinine Ratio: 10 (ref 10–24)
BUN: 10 mg/dL (ref 8–27)
CO2: 23 mmol/L (ref 20–29)
Calcium: 9.2 mg/dL (ref 8.6–10.2)
Chloride: 104 mmol/L (ref 96–106)
Creatinine, Ser: 1 mg/dL (ref 0.76–1.27)
Glucose: 94 mg/dL (ref 70–99)
Potassium: 5 mmol/L (ref 3.5–5.2)
Sodium: 143 mmol/L (ref 134–144)
eGFR: 82 mL/min/1.73 (ref >59)

## 2024-02-18 LAB — CBC
Hematocrit: 46 % (ref 37.5–51.0)
Hemoglobin: 15.2 g/dL (ref 13.0–17.7)
MCH: 31.6 pg (ref 26.6–33.0)
MCHC: 33 g/dL (ref 31.5–35.7)
MCV: 96 fL (ref 79–97)
Platelets: 233 x10E3/uL (ref 150–450)
RBC: 4.81 x10E6/uL (ref 4.14–5.80)
RDW: 12.9 % (ref 11.6–15.4)
WBC: 7.3 x10E3/uL (ref 3.4–10.8)

## 2024-02-27 ENCOUNTER — Telehealth: Payer: Self-pay | Admitting: Cardiology

## 2024-02-27 NOTE — Telephone Encounter (Signed)
 Pt calling in asking how should he take his medications tomorrow prior to cardioversion. He states he usually takes his medications around 10am but his cardioversion is around 6-7am, so should he take them before the cardioversion? Please advise.

## 2024-02-27 NOTE — Telephone Encounter (Signed)
 Called to confirm/remind patient of their procedure.   Scheduled for: DCCV  [x]  Date 02/28/24  [x]  Time 07:30 am [x]  Arrival time 06:30 am [x]  Location [facility]   [x]  Designated Driver  [x]  Instructions, time, and location reviewed with patient    [x]  H&P within 30 days  [x]  EKG within 30 days  [x]  Orders  [x]  Labs  [x]  Diet  [x]  Medication instructions reviewed   [x]  Spoke with patient and reviewed all information

## 2024-02-28 ENCOUNTER — Ambulatory Visit: Admitting: Anesthesiology

## 2024-02-28 ENCOUNTER — Encounter
Admission: RE | Disposition: A | Discharge status: Home / Self Care | Payer: Self-pay | Source: Home / Self Care | Attending: Cardiology

## 2024-02-28 ENCOUNTER — Encounter: Payer: Self-pay | Admitting: Cardiology

## 2024-02-28 ENCOUNTER — Ambulatory Visit
Admission: RE | Admit: 2024-02-28 | Discharge: 2024-02-28 | Disposition: A | Discharge status: Home / Self Care | Attending: Cardiology | Admitting: Cardiology

## 2024-02-28 DIAGNOSIS — Z7901 Long term (current) use of anticoagulants: Secondary | ICD-10-CM | POA: Diagnosis not present

## 2024-02-28 DIAGNOSIS — E0500 Thyrotoxicosis with diffuse goiter without thyrotoxic crisis or storm: Secondary | ICD-10-CM | POA: Insufficient documentation

## 2024-02-28 DIAGNOSIS — I4891 Unspecified atrial fibrillation: Secondary | ICD-10-CM | POA: Diagnosis present

## 2024-02-28 DIAGNOSIS — Z87891 Personal history of nicotine dependence: Secondary | ICD-10-CM | POA: Insufficient documentation

## 2024-02-28 DIAGNOSIS — I4819 Other persistent atrial fibrillation: Secondary | ICD-10-CM | POA: Insufficient documentation

## 2024-02-28 DIAGNOSIS — Z79899 Other long term (current) drug therapy: Secondary | ICD-10-CM | POA: Diagnosis not present

## 2024-02-28 DIAGNOSIS — I429 Cardiomyopathy, unspecified: Secondary | ICD-10-CM | POA: Insufficient documentation

## 2024-02-28 DIAGNOSIS — Z538 Procedure and treatment not carried out for other reasons: Secondary | ICD-10-CM | POA: Insufficient documentation

## 2024-02-28 HISTORY — DX: Cardiac arrhythmia, unspecified: I49.9

## 2024-02-28 HISTORY — DX: Thyrotoxicosis, unspecified without thyrotoxic crisis or storm: E05.90

## 2024-02-28 HISTORY — PX: CARDIOVERSION: SHX1299

## 2024-02-28 SURGERY — CARDIOVERSION
Anesthesia: General

## 2024-02-28 MED ORDER — LIDOCAINE HCL (PF) 2 % IJ SOLN
INTRAMUSCULAR | Status: AC
  Filled 2024-02-28: qty 5

## 2024-02-28 MED ORDER — PROPOFOL 10 MG/ML IV BOLUS
INTRAVENOUS | Status: AC
  Filled 2024-02-28: qty 20

## 2024-02-28 MED ORDER — SODIUM CHLORIDE 0.9 % IV SOLN: INTRAVENOUS | Status: DC

## 2024-02-28 NOTE — Anesthesia Preprocedure Evaluation (Signed)
 Anesthesia Evaluation  Patient identified by MRN, date of birth, ID band Patient awake    Reviewed: Allergy & Precautions, NPO status , Patient's Chart, lab work & pertinent test results  History of Anesthesia Complications Negative for: history of anesthetic complications  Airway Mallampati: III  TM Distance: >3 FB Neck ROM: full    Dental  (+) Chipped   Pulmonary neg pulmonary ROS, former smoker   Pulmonary exam normal        Cardiovascular + dysrhythmias Atrial Fibrillation      Neuro/Psych MS  Neuromuscular disease  negative psych ROS   GI/Hepatic negative GI ROS, Neg liver ROS,,,  Endo/Other   Hyperthyroidism   Renal/GU negative Renal ROS  negative genitourinary   Musculoskeletal  (+) Arthritis ,    Abdominal   Peds  Hematology negative hematology ROS (+)   Anesthesia Other Findings Past Medical History: No date: Abscess     Comment:  groin No date: Arthritis     Comment:  lower spine No date: Dysrhythmia No date: Erectile dysfunction No date: Hyperthyroidism No date: Low testosterone No date: Lumbar herniated disc     Comment:  L5 No date: Multiple sclerosis (HCC) No date: Staph aureus infection  Past Surgical History: 11/22/2021: CARDIOVERSION; N/A     Comment:  Procedure: CARDIOVERSION;  Surgeon: Darliss Rogue,               MD;  Location: [facility];  Service: Cardiovascular;                Laterality: N/A; 07/04/2016: COLONOSCOPY WITH PROPOFOL ; N/A     Comment:  Procedure: COLONOSCOPY WITH PROPOFOL ;  Surgeon: Rogelia Copping, MD;  Location: [facility];  Service:               Endoscopy;  Laterality: N/A; 1964: FINGER SURGERY 1960: MASS EXCISION 07/04/2016: POLYPECTOMY     Comment:  Procedure: POLYPECTOMY;  Surgeon: Rogelia Copping, MD;                Location: [facility];  Service: Endoscopy;; No date: TONSILLECTOMY  BMI    Body Mass Index: 36.34 kg/m       Reproductive/Obstetrics negative OB ROS                              Anesthesia Physical Anesthesia Plan  ASA: 3  Anesthesia Plan: General   Post-op Pain Management: Minimal or no pain anticipated   Induction: Intravenous  PONV Risk Score and Plan: 1 and Propofol  infusion and TIVA  Airway Management Planned: Natural Airway and Nasal Cannula  Additional Equipment:   Intra-op Plan:   Post-operative Plan:   Informed Consent: I have reviewed the patients History and Physical, chart, labs and discussed the procedure including the risks, benefits and alternatives for the proposed anesthesia with the patient or authorized representative who has indicated his/her understanding and acceptance.     Dental Advisory Given  Plan Discussed with: Anesthesiologist, CRNA and Surgeon  Anesthesia Plan Comments: (Patient consented for risks of anesthesia including but not limited to:  - adverse reactions to medications - risk of airway placement if required - damage to eyes, teeth, lips or other oral mucosa - nerve damage due to positioning  - sore throat or hoarseness - Damage to heart, brain, nerves, lungs, other parts of body or loss of life  Patient voiced understanding and assent.)        Anesthesia Quick Evaluation

## 2024-02-28 NOTE — Interval H&P Note (Signed)
 History and Physical Interval Note:  02/28/2024 7:44 AM  Dustin Gray  has presented today for surgery, with the diagnosis of   Afib.  The various methods of treatment have been discussed with the patient and family. After consideration of risks, benefits and other options for treatment, the patient has consented to  Procedure(s): CARDIOVERSION (N/A) as a surgical intervention.  The patient's history has been reviewed, patient examined, no change in status, stable for surgery.  I have reviewed the patient's chart and labs.  Questions were answered to the patient's satisfaction.    EKG reviewed. Patient noted to be in normal sinus rhythm. As such, dc cardioversion not performed.  Redell Agbor-Etang

## 2024-03-02 ENCOUNTER — Encounter: Payer: Self-pay | Admitting: Cardiology

## 2024-03-25 ENCOUNTER — Encounter: Payer: Self-pay | Admitting: Cardiology

## 2024-03-25 ENCOUNTER — Ambulatory Visit: Attending: Cardiology | Admitting: Cardiology

## 2024-03-25 VITALS — BP 110/70 | HR 112 | Ht 74.0 in | Wt 282.1 lb

## 2024-03-25 DIAGNOSIS — E0590 Thyrotoxicosis, unspecified without thyrotoxic crisis or storm: Secondary | ICD-10-CM

## 2024-03-25 DIAGNOSIS — I429 Cardiomyopathy, unspecified: Secondary | ICD-10-CM

## 2024-03-25 DIAGNOSIS — I4891 Unspecified atrial fibrillation: Secondary | ICD-10-CM | POA: Diagnosis not present

## 2024-03-25 NOTE — Progress Notes (Signed)
  Electrophysiology Office Follow up Visit Note:    Date:  03/25/2024   ID:  Dustin Gray, DOB 09/19/1955, MRN [id number]  PCP:  Hugh Charleston, MD (Inactive)  [facility] Cardiologist:  Redell Cave, MD  [facility] Electrophysiologist:  OLE ONEIDA HOLTS, MD    Interval History:     Dustin Gray is a 68 y.o. male who presents for a follow up visit.   I last saw the patient Nov 18, 2023.  He has a history of multiple sclerosis, chronic systolic heart failure and atrial fibrillation.  He takes Eliquis  for stroke prophylaxis.  He had a cardioversion on February 28, 2024. He takes Eliquis  for stroke prophylaxis.  He is doing okay today.  He thinks he is out of rhythm.  He is currently selling a home in [place] and [place] .  There is significant stressors at home.      Past medical, surgical, social and family history were reviewed.  ROS:   Please see the history of present illness.    All other systems reviewed and are negative.  EKGs/Labs/Other Studies Reviewed:    The following studies were reviewed today:     EKG Interpretation Date/Time:  Wednesday March 25 2024 15:18:51 EDT Ventricular Rate:  112 PR Interval:    QRS Duration:  70 QT Interval:  334 QTC Calculation: 455 R Axis:   -12  Text Interpretation: Atrial fibrillation with rapid ventricular response Confirmed by HOLTS OLE [phone]) on 03/25/2024 3:21:19 PM    Physical Exam:    VS:  BP 110/70 (BP Location: Left Arm, Patient Position: Sitting, Cuff Size: Large)   Pulse (!) 112   Ht 6' 2 (1.88 m)   Wt 282 lb 2 oz (128 kg)   SpO2 98%   BMI 36.22 kg/m     Wt Readings from Last 3 Encounters:  03/25/24 282 lb 2 oz (128 kg)  02/28/24 283 lb (128.4 kg)  02/17/24 283 lb 3.2 oz (128.5 kg)     GEN: no distress CARD: Irregularly irregular, No MRG RESP: No IWOB. CTAB.      ASSESSMENT:    1. Atrial fibrillation with rapid ventricular response (HCC)   2. Atrial  fibrillation with RVR (HCC)   3. Cardiomyopathy, unspecified type (HCC)   4. Hyperthyroidism    PLAN:    In order of problems listed above:  #Persistent atrial fibrillation Recent cardioversion on February 28, 2024.  On Eliquis  for stroke prophylaxis.  Back in atrial fibrillation today.  On Eliquis  for stroke prophylaxis.  I discussed treatment options with him today including amiodarone and dofetilide.  Given his history of thyroid  dysfunction, I do not think amiodarone is the next best step.  I have recommended Tikosyn.  I discussed the Tikosyn loading process in detail including the risks and need for inpatient hospitalization.  He wishes to proceed with that but will let us  know when he would like to come in.  He is planning to finish closing on his homes in [place]  and [place]  and will do the Tikosyn admission after that.  I will put a backstop appointment in for 6 months from now with an APP.     Signed, OLE HOLTS, MD, [facility] 03/25/2024 3:33 PM    Electrophysiology [facility]

## 2024-03-25 NOTE — Patient Instructions (Signed)
 Medication Instructions:  Your physician recommends that you continue on your current medications as directed. Please refer to the Current Medication list given to you today.  *If you need a refill on your cardiac medications before your next appointment, please call your pharmacy*  Follow-Up: At [facility], you and your health needs are our priority.  As part of our continuing mission to provide you with exceptional heart care, our providers are all part of one team.  This team includes your primary Cardiologist (physician) and Advanced Practice Providers or APPs (Physician Assistants and Nurse Practitioners) who all work together to provide you with the care you need, when you need it.  Your next appointment:   6 month(s)  Provider:   Suzann Riddle, NP    Tikosyn (Dofetilide) Hospital Admission   Prior to day of admission:  Check with drug insurance company for cost of drug to ensure affordability --- Dofetilide 500 mcg twice a day.  GoodRx is an option if insurance copay is unaffordable.    No Benadryl is allowed 3 days prior to admission.   Please ensure no missed doses of your anticoagulation (blood thinner) for 3 weeks prior to admission. If a dose is missed please notify our office immediately.   A pharmacist will review all your medications for potential interactions with Tikosyn. If any medication changes are needed prior to admission we will be in touch with you.   If any new medications are started AFTER your admission date is set with Radio producer. Please notify our office immediately so your medication list can be updated and reviewed by our pharmacist again.  On day of admission:  Tikosyn initiation requires a 3 night/4 day hospital stay with constant telemetry monitoring. You will have an EKG after each dose of Tikosyn as well as daily lab draws.   If the drug does not convert you to normal rhythm a cardioversion after the 4th dose of Tikosyn.   [facility]  office visit on the morning of admission is needed for preliminary labs/ekg.   Time of admission is dependent on bed availability in the hospital. In some instances, you will be sent home until bed is available. Rarely admission can be delayed to the following day if hospital census prevents available beds.   You may bring personal belongings/clothing with you to the hospital. Please leave your suitcase in the car until you arrive in admissions.   Questions please call our office at [phone]

## 2024-03-30 ENCOUNTER — Telehealth: Payer: Self-pay | Admitting: Pharmacist

## 2024-03-30 NOTE — Telephone Encounter (Signed)
 Medication list reviewed in anticipation of upcoming Tikosyn initiation. Patient is taking furosemide  as needed with potassium supplement. Monitor closely for changes in electrolytes while on Tikosyn. Diltiazem  may increase serum concentration of Tikosyn, monitor Qtc closely if planning to continue diltiazem  upon Tikosyn initiation.   Patient is anticoagulated on apixaban  on the appropriate dose. Please ensure that patient has not missed any anticoagulation doses in the 3 weeks prior to Tikosyn initiation.   Patient will need to be counseled to avoid use of Benadryl while on Tikosyn and in the 2-3 days prior to Tikosyn initiation.

## 2024-04-03 ENCOUNTER — Encounter (HOSPITAL_COMMUNITY): Payer: Self-pay | Admitting: *Deleted

## 2024-04-06 NOTE — Telephone Encounter (Signed)
Left message to discuss scheduling.

## 2024-04-20 ENCOUNTER — Other Ambulatory Visit (HOSPITAL_COMMUNITY): Payer: Self-pay

## 2024-04-20 ENCOUNTER — Telehealth (HOSPITAL_COMMUNITY): Payer: Self-pay | Admitting: Pharmacy Technician

## 2024-04-20 NOTE — Telephone Encounter (Signed)
 Patient Product/process Development Scientist completed.    The patient is insured through [employer]. Patient has Medicare and is not eligible for a copay card, but may be able to apply for patient assistance or Medicare RX Payment Plan (Patient Must reach out to their plan, if eligible for payment plan), if available.    Ran test claim for dofetilide (Tikosyn) 500 mcg and the current 30 day co-pay is $0.00.   This test claim was processed through [facility]- copay amounts may vary at other pharmacies due to pharmacy/plan contracts, or as the patient moves through the different stages of their insurance plan.     Reyes Sharps, CPHT Pharmacy Technician Patient Advocate Specialist Lead [facility] Patient Advocate Team Direct Number: [phone]  Fax: [phone]

## 2024-04-29 ENCOUNTER — Encounter: Payer: Self-pay | Admitting: Cardiology

## 2024-04-29 ENCOUNTER — Ambulatory Visit: Attending: Cardiology | Admitting: Cardiology

## 2024-04-29 VITALS — BP 116/68 | HR 101 | Ht 74.0 in | Wt 288.4 lb

## 2024-04-29 DIAGNOSIS — E0590 Thyrotoxicosis, unspecified without thyrotoxic crisis or storm: Secondary | ICD-10-CM

## 2024-04-29 DIAGNOSIS — I429 Cardiomyopathy, unspecified: Secondary | ICD-10-CM | POA: Diagnosis not present

## 2024-04-29 DIAGNOSIS — I4891 Unspecified atrial fibrillation: Secondary | ICD-10-CM | POA: Diagnosis not present

## 2024-04-29 NOTE — Patient Instructions (Signed)
 Medication Instructions:   Your physician recommends that you continue on your current medications as directed. Please refer to the Current Medication list given to you today.   *If you need a refill on your cardiac medications before your next appointment, please call your pharmacy*  Lab Work:  No labs ordered today   If you have labs (blood work) drawn today and your tests are completely normal, you will receive your results only by: MyChart Message (if you have MyChart) OR A paper copy in the mail If you have any lab test that is abnormal or we need to change your treatment, we will call you to review the results.  Testing/Procedures:  No test ordered today   Follow-Up: At [facility], you and your health needs are our priority.  As part of our continuing mission to provide you with exceptional heart care, our providers are all part of one team.  This team includes your primary Cardiologist (physician) and Advanced Practice Providers or APPs (Physician Assistants and Nurse Practitioners) who all work together to provide you with the care you need, when you need it.  Your next appointment:   3 month(s)  Provider:   You may see Constancia Delton, MD or one of the following Advanced Practice Providers on your designated Care Team:   Laneta Pintos, NP Gildardo Labrador, PA-C Varney Gentleman, PA-C Cadence Francis, PA-C Ronald Cockayne, NP Morey Ar, NP    We recommend signing up for the patient portal called MyChart.  Sign up information is provided on this After Visit Summary.  MyChart is used to connect with patients for Virtual Visits (Telemedicine).  Patients are able to view lab/test results, encounter notes, upcoming appointments, etc.  Non-urgent messages can be sent to your provider as well.   To learn more about what you can do with MyChart, go to [email or web address].

## 2024-04-29 NOTE — Progress Notes (Signed)
 Cardiology Office Note:    Date:  04/29/2024   ID:  RENSO SWETT, DOB 13-Sep-1955, MRN [id number]  PCP:  Hugh Charleston, MD (Inactive)  Cardiologist:  Redell Cave, MD  Electrophysiologist:  OLE ONEIDA HOLTS, MD   Referring MD: No ref. provider found   Chief Complaint  Patient presents with   Follow-up    2 month follow up  / pt has been doing well with no complaints of chest pain, chest pressure. some SOB with walking, medciation reviewed verbally with patient    History of Present Illness:    Dustin Gray is a 68 y.o. male with a hx of persistent atrial fibrillation s/p DCCV 5/23, multiple sclerosis, lymphedema, hyperthyroidism/Graves' disease, who presents for follow-up.   Doing okay, denies palpitations.  Evaluated by EP, Tikosyn loading being planned next month.  Needed to make arrangements for travel and also his pads prior to scheduling Tikosyn loading for persistent atrial fibrillation.  Feels well, compliant with medications as prescribed.  No bleeding issues with Eliquis .   Prior notes/studies Echo 10/23 EF 55 to 60% Echo 08/2021 EF 40 to 45% Patient originally seen in the hospital for atrial fibrillation with rapid ventricular response.  He was managed with diltiazem  and Eliquis .  He was subsequently diagnosed with hyperthyroidism and propanolol ER added to his medical regimen.   Echocardiogram on 09/2019 showed normal ejection fraction with EF 55 to 60%, left atrial size was also normal.   He was started on anticoagulation due to plan for cardioversion.  Cardioversion was attempted but unsuccessful.  His heart rates have improved when hyperthyroidism is controlled.  Past Medical History:  Diagnosis Date   Abscess    groin   Arthritis    lower spine   Dysrhythmia    Erectile dysfunction    Hyperthyroidism    Low testosterone    Lumbar herniated disc    L5   Multiple sclerosis    Staph aureus infection     Past Surgical History:  Procedure  Laterality Date   CARDIOVERSION N/A 11/22/2021   Procedure: CARDIOVERSION;  Surgeon: Cave Redell, MD;  Location: [facility] ORS;  Service: Cardiovascular;  Laterality: N/A;   CARDIOVERSION N/A 02/28/2024   Procedure: CARDIOVERSION;  Surgeon: Cave Redell, MD;  Location: [facility] ORS;  Service: Cardiovascular;  Laterality: N/A;   COLONOSCOPY WITH PROPOFOL  N/A 07/04/2016   Procedure: COLONOSCOPY WITH PROPOFOL ;  Surgeon: Rogelia Copping, MD;  Location: [facility];  Service: Endoscopy;  Laterality: N/A;   FINGER SURGERY  1964   MASS EXCISION  1960   POLYPECTOMY  07/04/2016   Procedure: POLYPECTOMY;  Surgeon: Rogelia Copping, MD;  Location: [facility];  Service: Endoscopy;;   TONSILLECTOMY      Current Medications: Current Meds  Medication Sig   apixaban  (ELIQUIS ) 5 MG TABS tablet Take 1 tablet (5 mg total) by mouth 2 (two) times daily. 2 week supply pt needs to come in for blood work.   atorvastatin (LIPITOR) 20 MG tablet Take 20 mg by mouth daily.   baclofen  (LIORESAL ) 10 MG tablet Take 10 mg by mouth 3 (three) times daily.   diltiazem  (CARDIZEM  CD) 120 MG 24 hr capsule Take 1 capsule by mouth twice daily   Dimethyl Fumarate  240 MG CPDR Take 240 mg by mouth 2 (two) times daily.    furosemide  (LASIX ) 40 MG tablet Take 1 tablet (40 mg total) by mouth as needed. With your potassium.   gabapentin  (NEURONTIN ) 300 MG capsule Take 300-600 mg by mouth  See admin instructions. Take 1 capsule (300 mg) by mouth in the morning, take 1 capsule (300 mg) by mouth at midday, & take 2 capsules (600 mg) by mouth at bedtime.   methimazole  (TAPAZOLE ) 5 MG tablet Take 2.5 mg by mouth daily.   Multiple Vitamins-Minerals (CENTRUM MINIS MEN 50+ PO) Take 1 tablet by mouth daily.   potassium chloride  (KLOR-CON ) 10 MEQ tablet Take 1 tablet (10 mEq total) by mouth as needed. Take with your Lasix .   propranolol  ER (INDERAL  LA) 80 MG 24 hr capsule Take 1 capsule (80 mg total) by mouth 2 (two) times daily.      Allergies:   Cephalexin, Ancef [cefazolin sodium], and Cefadroxil   Social History   Socioeconomic History   Marital status: Legally Separated    Spouse name: Not on file   Number of children: Not on file   Years of education: Not on file   Highest education level: Not on file  Occupational History   Not on file  Tobacco Use   Smoking status: Former   Smokeless tobacco: Never   Tobacco comments:    smoked as teenager  Vaping Use   Vaping status: Never Used  Substance and Sexual Activity   Alcohol use: Not Currently    Comment: 2 drinks/month   Drug use: No   Sexual activity: Not on file  Other Topics Concern   Not on file  Social History Narrative   Not on file   Social Drivers of Health   Financial Resource Strain: Not on file  Food Insecurity: Not on file  Transportation Needs: Not on file  Physical Activity: Not on file  Stress: Not on file  Social Connections: Not on file     Family History: The patient's family history includes Diabetes in his father.  ROS:   Please see the history of present illness.     All other systems reviewed and are negative.  EKGs/Labs/Other Studies Reviewed:    The following studies were reviewed today:   EKG Interpretation Date/Time:  Wednesday April 29 2024 10:28:59 EST Ventricular Rate:  101 PR Interval:    QRS Duration:  78 QT Interval:  310 QTC Calculation: 401 R Axis:   -15  Text Interpretation: Atrial fibrillation with rapid ventricular response with a competing junctional pacemaker Confirmed by Darliss Rogue ([id number]) on 04/29/2024 10:40:15 AM    Recent Labs: 02/17/2024: BUN 10; Creatinine, Ser 1.00; Hemoglobin 15.2; Platelets 233; Potassium 5.0; Sodium 143  Recent Lipid Panel    Component Value Date/Time   CHOL 108 10/01/2019 0358   TRIG 81 10/01/2019 0358   HDL 35 (L) 10/01/2019 0358   CHOLHDL 3.1 10/01/2019 0358   VLDL 16 10/01/2019 0358   LDLCALC 57 10/01/2019 0358    Physical Exam:    VS:   BP 116/68 (BP Location: Left Arm, Patient Position: Sitting, Cuff Size: Normal)   Pulse (!) 101   Ht 6' 2 (1.88 m)   Wt 288 lb 6.4 oz (130.8 kg)   SpO2 97%   BMI 37.03 kg/m     Wt Readings from Last 3 Encounters:  04/29/24 288 lb 6.4 oz (130.8 kg)  03/25/24 282 lb 2 oz (128 kg)  02/28/24 283 lb (128.4 kg)     GEN:  Well nourished, well developed in no acute distress HEENT: Normal NECK: No JVD; No carotid bruits CARDIAC: Regular rate and rhythm RESPIRATORY:  Clear to auscultation without rales, wheezing or rhonchi  ABDOMEN: Soft, non-tender, non-distended MUSCULOSKELETAL:  no edema; No deformity  SKIN: Warm and dry NEUROLOGIC:  Alert and oriented x 3 PSYCHIATRIC:  Normal affect   ASSESSMENT:    1. Atrial fibrillation with rapid ventricular response (HCC)   2. Cardiomyopathy, unspecified type (HCC)   3. Hyperthyroidism    PLAN:    In order of problems listed above:  Persistent atrial fibrillation.  S/p DC cardioversion 11/22/2021.  EKG today showing A-fib heart rate 101.  Evaluated by EP, appreciate input.  Patient not a candidate for amiodarone due to prior thyroid  dysfunction.  Tikosyn loading planned for next month. Continue Cardizem  CD 120 mg twice daily,  propranolol  ER 80 mg twice daily, Eliquis  5 mg twice daily.   Cardiomyopathy (initial EF 40 to 45%, echo 10/23 EF 55-60%).  Likely tachycardia/A-fib induced.  Continue Cardizem  and Inderal . hyperthyroidism.  On methimazole . management as per endocrinology.  Follow-up in 3 months        Medication Adjustments/Labs and Tests Ordered: Current medicines are reviewed at length with the patient today.  Concerns regarding medicines are outlined above.  Orders Placed This Encounter  Procedures   EKG 12-Lead     No orders of the defined types were placed in this encounter.   Patient Instructions  Medication Instructions:  Your physician recommends that you continue on your current medications as directed. Please  refer to the Current Medication list given to you today.   *If you need a refill on your cardiac medications before your next appointment, please call your pharmacy*  Lab Work: No labs ordered today  If you have labs (blood work) drawn today and your tests are completely normal, you will receive your results only by: MyChart Message (if you have MyChart) OR A paper copy in the mail If you have any lab test that is abnormal or we need to change your treatment, we will call you to review the results.  Testing/Procedures: No test ordered today   Follow-Up: At [facility], you and your health needs are our priority.  As part of our continuing mission to provide you with exceptional heart care, our providers are all part of one team.  This team includes your primary Cardiologist (physician) and Advanced Practice Providers or APPs (Physician Assistants and Nurse Practitioners) who all work together to provide you with the care you need, when you need it.  Your next appointment:   3 month(s)  Provider:   You may see Redell Cave, MD or one of the following Advanced Practice Providers on your designated Care Team:   Lonni Meager, NP Lesley Maffucci, PA-C Bernardino Bring, PA-C Cadence [place], PA-C Tylene Lunch, NP Barnie Hila, NP    We recommend signing up for the patient portal called MyChart.  Sign up information is provided on this After Visit Summary.  MyChart is used to connect with patients for Virtual Visits (Telemedicine).  Patients are able to view lab/test results, encounter notes, upcoming appointments, etc.  Non-urgent messages can be sent to your provider as well.   To learn more about what you can do with MyChart, go to [email or web address].          Signed, Redell Cave, MD  04/29/2024 11:15 AM    [facility]

## 2024-04-30 ENCOUNTER — Telehealth: Payer: Self-pay | Admitting: Cardiology

## 2024-04-30 NOTE — Telephone Encounter (Signed)
*  STAT* If patient is at the pharmacy, call can be transferred to refill team.   1. Which medications need to be refilled? (please list name of each medication and dose if known)   apixaban  (ELIQUIS ) 5 MG TABS tablet   2. Would you like to learn more about the convenience, safety, & potential cost savings by using the [facility]?   3. Are you open to using the [facility] (Type [facility]. ).  4. Which pharmacy/location (including street and city if local pharmacy) is medication to be sent to?  [facility] [address] [place] - [address]   5. Do they need a 30 day or 90 day supply?   90 day  Patient stated he only has 2 days left of this medication.  Patient has appointment scheduled with Dr. Darliss on 07/31/24.

## 2024-05-01 MED ORDER — APIXABAN 5 MG PO TABS: 5.0000 mg | ORAL_TABLET | Freq: Two times a day (BID) | ORAL | 1 refills | Status: AC

## 2024-05-15 ENCOUNTER — Telehealth (HOSPITAL_COMMUNITY): Payer: Self-pay

## 2024-05-15 NOTE — Telephone Encounter (Signed)
 Initiated prior authorization to [facility]- for Tikosyn admission.  Date of service: 06/02/2024 Reference # [id number] Phone # [phone] Admission is pending for review. Faxed notes for clinical review.

## 2024-05-18 ENCOUNTER — Telehealth (HOSPITAL_COMMUNITY): Payer: Self-pay

## 2024-05-18 NOTE — Telephone Encounter (Signed)
 Disregard previous pending authorization number for Tikosyn admission.  Date of service: 06/02/2024 Faxed clinical notes to [facility] for review. Pending reference # [id number]

## 2024-05-19 NOTE — Telephone Encounter (Signed)
 Initiated prior authorization to [facility] for Tikosyn admission.  Date of service: 06/02/2024 Disregard previous pending authorization. Pending authorization # [id number] Fax # [phone]

## 2024-05-20 NOTE — Telephone Encounter (Signed)
 Called [employer] to check status of prior auth for Tikosyn Admission. Agent Rocky R stated that the CPT didn't require auth. Call Mzq#[id number]

## 2024-05-23 ENCOUNTER — Other Ambulatory Visit: Payer: Self-pay | Admitting: Cardiology

## 2024-05-25 NOTE — Telephone Encounter (Signed)
 Inpatient hospital stay approved for 06/02/24 per [employer]. Auth number ia [id number]

## 2024-05-29 ENCOUNTER — Encounter (HOSPITAL_COMMUNITY): Payer: Self-pay

## 2024-06-02 ENCOUNTER — Encounter (HOSPITAL_COMMUNITY): Payer: Self-pay | Admitting: Physician Assistant

## 2024-06-02 ENCOUNTER — Encounter (HOSPITAL_COMMUNITY): Payer: Self-pay | Admitting: Cardiovascular Disease

## 2024-06-02 ENCOUNTER — Ambulatory Visit (HOSPITAL_COMMUNITY)
Admission: RE | Admit: 2024-06-02 | Discharge: 2024-06-02 | Disposition: A | Source: Ambulatory Visit | Attending: Physician Assistant | Admitting: Physician Assistant

## 2024-06-02 ENCOUNTER — Telehealth (HOSPITAL_COMMUNITY): Payer: Self-pay | Admitting: Pharmacy Technician

## 2024-06-02 ENCOUNTER — Other Ambulatory Visit: Payer: Self-pay

## 2024-06-02 ENCOUNTER — Inpatient Hospital Stay (HOSPITAL_COMMUNITY)
Admit: 2024-06-02 | Discharge: 2024-06-05 | DRG: 309 | Disposition: A | Attending: Cardiovascular Disease | Admitting: Cardiovascular Disease

## 2024-06-02 ENCOUNTER — Other Ambulatory Visit (HOSPITAL_COMMUNITY): Payer: Self-pay

## 2024-06-02 VITALS — BP 108/80 | HR 93 | Ht 74.0 in | Wt 290.0 lb

## 2024-06-02 DIAGNOSIS — I4891 Unspecified atrial fibrillation: Secondary | ICD-10-CM

## 2024-06-02 DIAGNOSIS — I4819 Other persistent atrial fibrillation: Principal | ICD-10-CM | POA: Diagnosis present

## 2024-06-02 HISTORY — DX: Spinal stenosis, site unspecified: M48.00

## 2024-06-02 LAB — BASIC METABOLIC PANEL WITH GFR
BUN/Creatinine Ratio: 10 (ref 10–24)
BUN: 10 mg/dL (ref 8–27)
CO2: 29 mmol/L (ref 20–29)
Calcium: 9.2 mg/dL (ref 8.6–10.2)
Chloride: 106 mmol/L (ref 96–106)
Creatinine, Ser: 0.98 mg/dL (ref 0.76–1.27)
Glucose: 97 mg/dL (ref 70–99)
Potassium: 4.5 mmol/L (ref 3.5–5.2)
Sodium: 143 mmol/L (ref 134–144)
eGFR: 84 mL/min/1.73 (ref >59)

## 2024-06-02 LAB — MAGNESIUM: Magnesium: 2 mg/dL (ref 1.6–2.3)

## 2024-06-02 MED ORDER — SODIUM CHLORIDE 0.9% FLUSH: 3.0000 mL | INTRAVENOUS | Status: DC | PRN

## 2024-06-02 MED ORDER — ATORVASTATIN CALCIUM 20 MG PO TABS
20.0000 mg | ORAL_TABLET | Freq: Every day | ORAL | Status: DC
  Administered 2024-06-03 – 2024-06-05 (×3): 20 mg via ORAL
  Filled 2024-06-02: qty 1
  Filled 2024-06-02 (×3): qty 2

## 2024-06-02 MED ORDER — SODIUM CHLORIDE 0.9 % IV SOLN: 250.0000 mL | INTRAVENOUS | Status: AC | PRN

## 2024-06-02 MED ORDER — DILTIAZEM HCL ER COATED BEADS 120 MG PO CP24
120.0000 mg | ORAL_CAPSULE | Freq: Two times a day (BID) | ORAL | Status: DC
  Administered 2024-06-03 – 2024-06-05 (×5): 120 mg via ORAL
  Filled 2024-06-02 (×5): qty 1

## 2024-06-02 MED ORDER — SODIUM CHLORIDE 0.9% FLUSH
3.0000 mL | Freq: Two times a day (BID) | INTRAVENOUS | Status: DC
  Administered 2024-06-02 – 2024-06-05 (×6): 3 mL via INTRAVENOUS

## 2024-06-02 MED ORDER — GABAPENTIN 300 MG PO CAPS: 300.0000 mg | ORAL_CAPSULE | ORAL | Status: DC

## 2024-06-02 MED ORDER — DOFETILIDE 500 MCG PO CAPS
500.0000 ug | ORAL_CAPSULE | Freq: Two times a day (BID) | ORAL | Status: DC
  Administered 2024-06-02 – 2024-06-05 (×6): 500 ug via ORAL
  Filled 2024-06-02 (×6): qty 1

## 2024-06-02 MED ORDER — GABAPENTIN 300 MG PO CAPS
300.0000 mg | ORAL_CAPSULE | ORAL | Status: DC
  Administered 2024-06-03 – 2024-06-05 (×5): 300 mg via ORAL
  Filled 2024-06-02 (×5): qty 1

## 2024-06-02 MED ORDER — PROPRANOLOL HCL ER 80 MG PO CP24
80.0000 mg | ORAL_CAPSULE | Freq: Two times a day (BID) | ORAL | Status: DC
  Administered 2024-06-03 – 2024-06-05 (×5): 80 mg via ORAL
  Filled 2024-06-02 (×7): qty 1

## 2024-06-02 MED ORDER — DIMETHYL FUMARATE 240 MG PO CPDR
240.0000 mg | DELAYED_RELEASE_CAPSULE | Freq: Two times a day (BID) | ORAL | Status: DC
  Administered 2024-06-03 – 2024-06-05 (×5): 240 mg via ORAL
  Filled 2024-06-02 (×9): qty 1

## 2024-06-02 MED ORDER — MAGNESIUM SULFATE 2 GM/50ML IV SOLN
2.0000 g | Freq: Once | INTRAVENOUS | Status: AC
  Administered 2024-06-02: 2 g via INTRAVENOUS
  Filled 2024-06-02: qty 50

## 2024-06-02 MED ORDER — APIXABAN 5 MG PO TABS
5.0000 mg | ORAL_TABLET | Freq: Two times a day (BID) | ORAL | Status: DC
  Administered 2024-06-02 – 2024-06-05 (×6): 5 mg via ORAL
  Filled 2024-06-02 (×6): qty 1

## 2024-06-02 MED ORDER — BACLOFEN 10 MG PO TABS
10.0000 mg | ORAL_TABLET | Freq: Three times a day (TID) | ORAL | Status: DC
  Administered 2024-06-03 – 2024-06-05 (×7): 10 mg via ORAL
  Filled 2024-06-02 (×7): qty 1

## 2024-06-02 MED ORDER — APIXABAN 5 MG PO TABS: 5.0000 mg | ORAL_TABLET | Freq: Two times a day (BID) | ORAL | Status: DC

## 2024-06-02 MED ORDER — GABAPENTIN 300 MG PO CAPS
600.0000 mg | ORAL_CAPSULE | Freq: Every day | ORAL | Status: DC
  Administered 2024-06-02 – 2024-06-04 (×3): 600 mg via ORAL
  Filled 2024-06-02 (×3): qty 2

## 2024-06-02 NOTE — Progress Notes (Addendum)
 Per provider Mealor it's okay to give tikosyn  without a new EKG even with elevated qtc as pt did have an EKG earlier today so a new one is not needed.

## 2024-06-02 NOTE — Telephone Encounter (Signed)
 Patient Product/process Development Scientist completed.    The patient is insured through [employer]. Patient has Medicare and is not eligible for a copay card, but may be able to apply for patient assistance or Medicare RX Payment Plan (Patient Must reach out to their plan, if eligible for payment plan), if available.    Ran test claim for dofetilide (Tikosyn) 500 mcg and the current 30 day co-pay is $0.00.   This test claim was processed through [facility]- copay amounts may vary at other pharmacies due to pharmacy/plan contracts, or as the patient moves through the different stages of their insurance plan.     Reyes Sharps, CPHT Pharmacy Technician Patient Advocate Specialist Lead [facility] Patient Advocate Team Direct Number: [phone]  Fax: [phone]

## 2024-06-02 NOTE — H&P (Signed)
 Primary Care Physician: Hugh Charleston, MD (Inactive) Primary Cardiologist: Redell Cave, MD Electrophysiologist: OLE ONEIDA HOLTS, MD  Referring Physician: Dr. Holts Laurel Dustin Gray is a 68 y.o. male with a history of paroxysmal AF (on Eliquis ), multiple sclerosis, lymphedema, NICM, hyperthyroidism, who presents for follow up in the [facility].  The patient was initially diagnosed with A-fib in 2021 and was managed by Dr. Cave.  He was diagnosed with hyperthyroidism and started on propranolol  as well as Eliquis  for planned cardioversion that took place on 10/2019 that was unsuccessful.  2D echo was completed that showed normal EF of 55-60% and patient had improvement to heart rate.  He was seen in follow-up in 2023 and atrial fibrillation and underwent successful DCCV on 11/22/2021.  2D echo was completed on 08/2021 showing reduced EF of 40-45%.  He was referred to Dr. Holts and was seen on 02/21/2022 to determine candidacy for ablation or rhythm management.  The plan was made to pursue rhythm management at that time.  Repeat 2D echo showed normalized EF of 55 to 60% on 03/2022.  He was seen By Dr. Daleen on 02/27/2024 noted to be in AF.  He was scheduled for DCCV but was canceled the day of due to conversion to sinus rhythm spontaneously.  He was last seen by Dr. Holts on 03/25/2024 and was noted to be back in atrial fibrillation.  Treatment options were discussed at that time with rhythm control options being amiodarone and Tikosyn .  He has a history of thyroid  dysfunction therefore Tikosyn  was recommended and patient presents today for pre-Tikosyn  visit.  Dustin Gray presents today for his pre-Tikosyn  follow-up visit.  During today's visit he reports no missed doses of his Eliquis  and denied any acute changes in his heart rate.  During today's visit we reviewed his upcoming hospitalization and expectations post hospitalization.  We discussed the importance  of med compliance, and also reviewed devices for monitoring his heart rate in the future.  He is in the process of relocating to [facility]   but will still be followed by our cardiology team here in [place] .  He had all questions otherwise answered to his satisfaction during today's visit.  Today, he denies symptoms of palpitations, chest pain, shortness of breath, orthopnea, PND, lower extremity edema, dizziness, presyncope, syncope, snoring, daytime somnolence, bleeding, or neurologic sequela. The patient is tolerating medications without difficulties and is otherwise without complaint today.    Atrial Fibrillation Risk Factors:  Doesd not have a history of sleep apnea  Atrial Fibrillation Management history:  Previous antiarrhythmic drugs: None Previous cardioversions: Unsuccessful attempt 10/2019  Previous ablations: None Anticoagulation history: Eliquis   ROS- All systems are reviewed and negative except as per the HPI above.  Past Medical History:  Diagnosis Date   Abscess    groin   Arthritis    lower spine   Dysrhythmia    Erectile dysfunction    Hyperthyroidism    Low testosterone    Lumbar herniated disc    L5   Multiple sclerosis    Spinal stenosis    Staph aureus infection     Current Facility-Administered Medications  Medication Dose Route Frequency Provider Last Rate Last Admin   0.9 %  sodium chloride  infusion  250 mL Intravenous PRN Mealor, Augustus E, MD       apixaban  (ELIQUIS ) tablet 5 mg  5 mg Oral BID Mealor, Augustus E, MD       apixaban  (ELIQUIS )  tablet 5 mg  5 mg Oral BID Dick, Ernest H Jr., NP       NOREEN ON 06/03/2024] atorvastatin  (LIPITOR) tablet 20 mg  20 mg Oral Daily Wyn Jackee VEAR Mickey., NP       NOREEN ON 06/03/2024] baclofen  (LIORESAL ) tablet 10 mg  10 mg Oral TID Wyn Jackee VEAR Mickey., NP       [START ON 06/03/2024] diltiazem  (CARDIZEM  CD) 24 hr capsule 120 mg  120 mg Oral BID Wyn Jackee VEAR Mickey., NP       [START ON  06/03/2024] Dimethyl Fumarate  CPDR 240 mg  240 mg Oral BID Wyn Jackee VEAR Mickey., NP       dofetilide  (TIKOSYN ) capsule 500 mcg  500 mcg Oral BID Mealor, Augustus E, MD       magnesium  sulfate IVPB 2 g 50 mL  2 g Intravenous Once Mealor, Augustus E, MD       [START ON 06/03/2024] propranolol  ER (INDERAL  LA) 24 hr capsule 80 mg  80 mg Oral BID Wyn Jackee VEAR Mickey., NP       sodium chloride  flush (NS) 0.9 % injection 3 mL  3 mL Intravenous Q12H Mealor, Eulas BRAVO, MD       sodium chloride  flush (NS) 0.9 % injection 3 mL  3 mL Intravenous PRN Mealor, Augustus E, MD        Physical Exam: BP 135/62 (BP Location: Left Arm)   Pulse 61   Temp 97.8 F (36.6 C) (Oral)   Resp 19   SpO2 96%   GEN: Well nourished, well developed in no acute distress NECK: No JVD; No carotid bruits CARDIAC: Irregularly irregular rate and rhythm, no murmurs, rubs, gallops RESPIRATORY:  Clear to auscultation without rales, wheezing or rhonchi  ABDOMEN: Soft, non-tender, non-distended EXTREMITIES:  +1 bilateral lower extremity edema  Wt Readings from Last 3 Encounters:  06/02/24 131.5 kg  04/29/24 130.8 kg  03/25/24 128 kg     EKG today demonstrates:   EKG Interpretation Date/Time:    Ventricular Rate:    PR Interval:    QRS Duration:    QT Interval:    QTC Calculation:   R Axis:      Text Interpretation:          Echo Completed 03/28/2022  1. Left ventricular ejection fraction, by estimation, is 55 to 60%. The  left ventricle has normal function. The left ventricle has no regional  wall motion abnormalities. Left ventricular diastolic parameters are  indeterminate.   2. Right ventricular systolic function is normal. The right ventricular  size is normal.   3. Right atrial size was mildly dilated.   4. The mitral valve is normal in structure. No evidence of mitral valve  regurgitation.   5. The aortic valve was not well visualized. Aortic valve regurgitation  is not visualized. Aortic valve  sclerosis/calcification is present,  without any evidence of aortic stenosis.   6. Aortic dilatation noted. There is mild dilatation of the aortic root,  measuring 39 mm.   7. The inferior vena cava is normal in size with greater than 50%  respiratory variability, suggesting right atrial pressure of 3 mmHg.    CHA2DS2-VASc Score = 1  The patient's score is based upon: CHF History: 0 HTN History: 0 Diabetes History: 0 Stroke History: 0 Vascular Disease History: 0 Age Score: 1 Gender Score: 0 I cannot find EKG   ASSESSMENT AND PLAN: Persistent Atrial Fibrillation (ICD10:  I48.19) The patient's CHA2DS2-VASc score  is 1, indicating a 0.6% annual risk of stroke.   - Presents today for pre-Tikosyn  visit with no acute changes since previous follow-up. -No Benadryl or missed doses of  Eliqus - Creatinine clearance is 131 mL/min - Start Tikosyn  500 mcg twice daily - Mg:2.0 and Cr:0.9 today - Patient will follow-up in 1 week post hospitalization  NICM: - Most recent 2D echo completed on 10//23 showing stable EF of 55 to 60%  Hyperthyroidism: - Continue current treatment plan per endocrinology  Follow up with the AF Clinic 1 week post loading. To be admitted later today once a bed becomes available.   Jackee Alberts, NP-C Afib Clinic [address] [place], [place] [address] [phone]  ______________________________________________  Admit for high risk drug monitoring: Tikosyn  Loading.    Primary EP > Dr. Nancey Admit Labs > K+ 4.5, Mg+ 2, Cr 0.98 / Cr Cl 134 mL/min EKG Review > QTc 447 ms in AF on 06/02/24  Anticipated Tikosyn  Dose > 500 mcg  Anticoagulation > Eliquis  for CHA2DS2-VASc 1    Daphne Barrack, NP-C, AGACNP-BC [facility] - Electrophysiology  06/02/2024, 3:48 PM

## 2024-06-02 NOTE — Progress Notes (Signed)
 Patient requested for his night time MS pills (diltiazem , baclofen , propanolol, dimethyl fumarate ). Notified cardiology MD, Dr. Gail for any indications of holding pills for the night. Per Dr. Gail, there is some drug interactions with some of these meds and hold these medications for tonight. Patient was informed and educated. Patient verbalizes understanding.

## 2024-06-02 NOTE — Progress Notes (Signed)
 Pharmacy: Dofetilide  (Tikosyn ) - Initial Consult Assessment and Electrolyte Replacement  Pharmacy consulted to assist in monitoring and replacing electrolytes in this 68 y.o. male admitted on 06/02/2024 undergoing dofetilide  initiation. First dofetilide  dose: 06/02/24 20:00.   Assessment:  Patient Exclusion Criteria: If any screening criteria checked as Yes, then  patient  should NOT receive dofetilide  until criteria item is corrected.  If "Yes" please indicate correction plan.  YES  NO Patient  Exclusion Criteria Correction Plan/Comments   [x]   []   Baseline QTc interval is greater than or equal to 440 msec. IF above YES box checked dofetilide  contraindicated unless patient has ICD; then may proceed if QTc 500-550 msec or with known ventricular conduction abnormalities may proceed with QTc 550-600 msec. QTc =  447 Spoke with attending physician Dr Nancey. Will replace magnesium  as below. Okay with current Qtc for Tikosyn  initiation.    []   [x]   Patient is known or suspected to have a digoxin  level greater than 2 ng/ml: No results found for: DIGOXIN      []   [x]   Creatinine clearance less than 20 ml/min (calculated using Cockcroft-Gault, actual body weight and serum creatinine): Estimated Creatinine Clearance: 104 mL/min (by C-G formula based on SCr of 0.98 mg/dL).     []   [x]  Patient has received drugs known to prolong the QT intervals within the last 48 hour (examples: phenothiazines, tricyclics or tetracyclic antidepressants, macrolides, 1st generation H-1 antihistamines (especially diphenhydramine), fluoroquinolones, azoles, ondansetron , metoclopramide, promethazine).   Updated information on QT prolonging agents is available to be searched on the following database:QT prolonging agents -If SSRI or antihistamine needed, preferred options are sertraline and loratadine respectively     []   [x]  Patient received a dose of a thiazide diuretic in the last 48 hours [including  hydrochlorothiazide (Oretic) alone or in any combination including triamterene (Dyazide, Maxzide)].    []   [x]  Patient received a medication known to increase dofetilide  plasma concentrations prior to initial dofetilide  dose:  Trimethoprim (Primsol, Proloprim) in the last 36 hours Verapamil (Calan, Verelan) in the last 36 hours or a sustained release dose in the last 72 hours Megestrol (Megace) in the last 5 days  Cimetidine (Tagamet) in the last 6 hours Ketoconazole (Nizoral) in the last 24 hours Itraconazole (Sporanox) in the last 48 hours  Prochlorperazine (Compazine) in the last 36 hours     []   [x]   Patient is known to have a history of torsades de pointes; congenital or acquired long QT syndromes.    []   [x]   Patient has received a Class 1 and Class 3 antiarrhythmic with less than 2 half-lives since last dose. (Disopyramide, Quinidine, Procainamide, Lidocaine , Mexiletine, Flecainide, Propafenone, Sotalol, Dronedarone)    []   [x]   Patient has received amiodarone therapy in the past 3 months or amiodarone level is greater than 0.3 ng/ml.    Labs:    Component Value Date/Time   K 4.5 06/02/2024 0819   MG 2.0 06/02/2024 9180     Plan: Select One Calculated CrCl  Dose q12h  [x]  > 60 ml/min 500 mcg  []  40-60 ml/min 250 mcg  []  20-40 ml/min 125 mcg   [x]   Physician selected initial dose within range recommended for patients level of renal function - will monitor for response.  []   Physician selected initial dose outside of range recommended for patients level of renal function - will discuss if the dose should be altered at this time.   Patient has been appropriately anticoagulated with Eliquis .  Potassium:  K >/= 4: Appropriate to initiate Tikosyn , no replacement needed    Magnesium : Mg 1.8-2: Give Mg 2 gm IV x1 to prevent Mg from dropping below 1.8 - do not need to recheck Mg. Appropriate to initiate Tikosyn    Thank you for allowing pharmacy to participate in this  patient's care   Rankin Sams 06/02/2024  3:23 PM

## 2024-06-02 NOTE — Progress Notes (Signed)
 Primary Care Physician: Hugh Charleston, MD (Inactive) Primary Cardiologist: Redell Cave, MD Electrophysiologist: OLE ONEIDA HOLTS, MD  Referring Physician: Dr. Holts Laurel Gray Dustin is a 68 y.o. male with a history of paroxysmal AF (on Eliquis ), multiple sclerosis, lymphedema, NICM, hyperthyroidism, who presents for follow up in the [facility].  The patient was initially diagnosed with A-fib in 2021 and was managed by Dr. Cave.  He was diagnosed with hyperthyroidism and started on propranolol  as well as Eliquis  for planned cardioversion that took place on 10/2019 that was unsuccessful.  2D echo was completed that showed normal EF of 55-60% and patient had improvement to heart rate.  He was seen in follow-up in 2023 and atrial fibrillation and underwent successful DCCV on 11/22/2021.  2D echo was completed on 08/2021 showing reduced EF of 40-45%.  He was referred to Dr. Holts and was seen on 02/21/2022 to determine candidacy for ablation or rhythm management.  The plan was made to pursue rhythm management at that time.  Repeat 2D echo showed normalized EF of 55 to 60% on 03/2022.  He was seen By Dr. Daleen on 02/27/2024 noted to be in AF.  He was scheduled for DCCV but was canceled the day of due to conversion to sinus rhythm spontaneously.  He was last seen by Dr. Holts on 03/25/2024 and was noted to be back in atrial fibrillation.  Treatment options were discussed at that time with rhythm control options being amiodarone and Tikosyn .  He has a history of thyroid  dysfunction therefore Tikosyn  was recommended and patient presents today for pre-Tikosyn  visit.  Mr. Dustin Gray presents today for his pre-Tikosyn  follow-up visit.  During today's visit he reports no missed doses of his Eliquis  and denied any acute changes in his heart rate.  During today's visit we reviewed his upcoming hospitalization and expectations post hospitalization.  We discussed the importance  of med compliance, and also reviewed devices for monitoring his heart rate in the future.  He is in the process of relocating to [facility] [place]  but will still be followed by our cardiology team here in [place] .  He had all questions otherwise answered to his satisfaction during today's visit.  Today, he denies symptoms of palpitations, chest pain, shortness of breath, orthopnea, PND, lower extremity edema, dizziness, presyncope, syncope, snoring, daytime somnolence, bleeding, or neurologic sequela. The patient is tolerating medications without difficulties and is otherwise without complaint today.    Atrial Fibrillation Risk Factors:  Doesd not have a history of sleep apnea  Atrial Fibrillation Management history:  Previous antiarrhythmic drugs: None Previous cardioversions: Unsuccessful attempt 10/2019  Previous ablations: None Anticoagulation history: Eliquis   ROS- All systems are reviewed and negative except as per the HPI above.  Past Medical History:  Diagnosis Date   Abscess    groin   Arthritis    lower spine   Dysrhythmia    Erectile dysfunction    Hyperthyroidism    Low testosterone    Lumbar herniated disc    L5   Multiple sclerosis    Staph aureus infection     Current Outpatient Medications  Medication Sig Dispense Refill   apixaban  (ELIQUIS ) 5 MG TABS tablet Take 1 tablet (5 mg total) by mouth 2 (two) times daily. 2 week supply pt needs to come in for blood work. 90 tablet 1   atorvastatin  (LIPITOR) 20 MG tablet Take 20 mg by mouth daily.     baclofen  (LIORESAL ) 10 MG  tablet Take 10 mg by mouth 3 (three) times daily.     diltiazem  (CARDIZEM  CD) 120 MG 24 hr capsule Take 1 capsule by mouth twice daily 180 capsule 1   Dimethyl Fumarate  240 MG CPDR Take 240 mg by mouth 2 (two) times daily.      furosemide  (LASIX ) 40 MG tablet Take 1 tablet (40 mg total) by mouth as needed. With your potassium. 60 tablet 11   gabapentin  (NEURONTIN ) 300 MG capsule  Take 300-600 mg by mouth See admin instructions. Take 1 capsule (300 mg) by mouth in the morning, take 1 capsule (300 mg) by mouth at midday, & take 2 capsules (600 mg) by mouth at bedtime.     methimazole  (TAPAZOLE ) 5 MG tablet Take 2.5 mg by mouth daily. (Patient taking differently: Take 2.5 mg by mouth daily. Do not take on Wednesday and Saturday)     Multiple Vitamins-Minerals (CENTRUM MINIS MEN 50+ PO) Take 1 tablet by mouth daily.     potassium chloride  (KLOR-CON ) 10 MEQ tablet Take 1 tablet (10 mEq total) by mouth as needed. Take with your Lasix . 90 tablet 3   propranolol  ER (INDERAL  LA) 80 MG 24 hr capsule Take 1 capsule by mouth twice daily 180 capsule 0   No current facility-administered medications for this encounter.    Physical Exam: BP 108/80   Pulse 93   Ht 6' 2 (1.88 m)   Wt 131.5 kg   BMI 37.23 kg/m   GEN: Well nourished, well developed in no acute distress NECK: No JVD; No carotid bruits CARDIAC: Irregularly irregular rate and rhythm, no murmurs, rubs, gallops RESPIRATORY:  Clear to auscultation without rales, wheezing or rhonchi  ABDOMEN: Soft, non-tender, non-distended EXTREMITIES:  +1 bilateral lower extremity edema  Wt Readings from Last 3 Encounters:  06/02/24 131.5 kg  04/29/24 130.8 kg  03/25/24 128 kg     EKG today demonstrates:   EKG Interpretation Date/Time:  Tuesday June 02 2024 08:31:52 EST Ventricular Rate:  93 PR Interval:    QRS Duration:  74 QT Interval:  360 QTC Calculation: 447 R Axis:   -31  Text Interpretation: Atrial fibrillation Left axis deviation Abnormal ECG When compared with ECG of 29-Apr-2024 10:28, No significant change was found Confirmed by Wyn Manus [phone]) on 06/02/2024 9:13:26 AM        Echo Completed 03/28/2022  1. Left ventricular ejection fraction, by estimation, is 55 to 60%. The  left ventricle has normal function. The left ventricle has no regional  wall motion abnormalities. Left ventricular diastolic  parameters are  indeterminate.   2. Right ventricular systolic function is normal. The right ventricular  size is normal.   3. Right atrial size was mildly dilated.   4. The mitral valve is normal in structure. No evidence of mitral valve  regurgitation.   5. The aortic valve was not well visualized. Aortic valve regurgitation  is not visualized. Aortic valve sclerosis/calcification is present,  without any evidence of aortic stenosis.   6. Aortic dilatation noted. There is mild dilatation of the aortic root,  measuring 39 mm.   7. The inferior vena cava is normal in size with greater than 50%  respiratory variability, suggesting right atrial pressure of 3 mmHg.    CHA2DS2-VASc Score = 1  The patient's score is based upon: CHF History: 0 HTN History: 0 Diabetes History: 0 Stroke History: 0 Vascular Disease History: 0 Age Score: 1 Gender Score: 0 I cannot find EKG   ASSESSMENT AND  PLAN: Persistent Atrial Fibrillation (ICD10:  I48.19) The patient's CHA2DS2-VASc score is 1, indicating a 0.6% annual risk of stroke.   - Presents today for pre-Tikosyn  visit with no acute changes since previous follow-up. -No Benadryl or missed doses of  Eliqus - Creatinine clearance is 131 mL/min - Start Tikosyn  500 mcg twice daily - Mg:2.0 and Cr:0.9 today - Patient will follow-up in 1 week post hospitalization  NICM: - Most recent 2D echo completed on 10//23 showing stable EF of 55 to 60%  Hyperthyroidism: - Continue current treatment plan per endocrinology  Follow up with the AF Clinic 1 week post loading. To be admitted later today once a bed becomes available.   Jackee Alberts, NP-C Afib Clinic [address] [place], [place] [address] [phone]

## 2024-06-02 NOTE — Progress Notes (Signed)
 Pt up in chair and unit nurse attempting placement. Request RN to place consult if PIV still needed and pt is back in bed. Powell Bowler, RN VAST

## 2024-06-03 LAB — MAGNESIUM: Magnesium: 2.4 mg/dL (ref 1.7–2.4)

## 2024-06-03 LAB — BASIC METABOLIC PANEL WITH GFR
Anion gap: 6 (ref 5–15)
BUN: 8 mg/dL (ref 8–23)
CO2: 30 mmol/L (ref 22–32)
Calcium: 8.6 mg/dL — ABNORMAL LOW (ref 8.9–10.3)
Chloride: 104 mmol/L (ref 98–111)
Creatinine, Ser: 0.89 mg/dL (ref 0.61–1.24)
GFR, Estimated: >60 mL/min (ref >60)
Glucose, Bld: 93 mg/dL (ref 70–99)
Potassium: 3.8 mmol/L (ref 3.5–5.1)
Sodium: 140 mmol/L (ref 135–145)

## 2024-06-03 LAB — HIV ANTIBODY (ROUTINE TESTING W REFLEX): HIV Screen 4th Generation wRfx: NONREACTIVE

## 2024-06-03 MED ORDER — SODIUM CHLORIDE 0.9 % IV SOLN: INTRAVENOUS | Status: AC

## 2024-06-03 MED ORDER — POTASSIUM CHLORIDE CRYS ER 20 MEQ PO TBCR
40.0000 meq | EXTENDED_RELEASE_TABLET | Freq: Once | ORAL | Status: AC
  Administered 2024-06-03: 40 meq via ORAL
  Filled 2024-06-03: qty 2

## 2024-06-03 NOTE — Plan of Care (Signed)
°  Problem: Clinical Measurements: Goal: Will remain free from infection Outcome: Progressing Goal: Respiratory complications will improve Outcome: Progressing Goal: Cardiovascular complication will be avoided Outcome: Progressing   Problem: Activity: Goal: Risk for activity intolerance will decrease Outcome: Progressing   Problem: Nutrition: Goal: Adequate nutrition will be maintained Outcome: Progressing   Problem: Elimination: Goal: Will not experience complications related to urinary retention Outcome: Progressing   Problem: Safety: Goal: Ability to remain free from injury will improve Outcome: Progressing   Problem: Activity: Goal: Ability to tolerate increased activity will improve Outcome: Progressing   Problem: Cardiac: Goal: Ability to achieve and maintain adequate cardiopulmonary perfusion will improve Outcome: Progressing

## 2024-06-03 NOTE — Progress Notes (Signed)
 Pharmacy: Dofetilide  (Tikosyn ) - Follow Up Assessment and Electrolyte Replacement  Pharmacy consulted to assist in monitoring and replacing electrolytes in this 68 y.o. male admitted on 06/02/2024 undergoing dofetilide  initiation. First dofetilide  dose: 06/03/24  Labs:    Component Value Date/Time   K 3.8 06/03/2024 0432   MG 2.4 06/03/2024 0432     Plan: Potassium: K 3.8-3.9:  Give KCl 40 mEq po x1   Magnesium : Mg > 2: No additional supplementation needed   Ozell Jamaica, PharmD, BCPS, [facility] Clinical Pharmacist [phone] Please check AMION for all [facility] Pharmacy numbers 06/03/2024

## 2024-06-03 NOTE — Progress Notes (Addendum)
° °  Electrophysiology Rounding Note  Patient Name: Dustin Gray Date of Encounter: 06/03/2024  Primary Cardiologist: Redell Cave, MD  Electrophysiologist: OLE ONEIDA HOLTS, MD    Subjective   Pt remains in afib on Tikosyn  500 mcg BID   QTc from EKG last pm shows stable QTc at 474 ms   The patient is doing well today.  At this time, the patient denies chest pain, shortness of breath, or any new concerns.  Inpatient Medications    Scheduled Meds:  apixaban   5 mg Oral BID   atorvastatin   20 mg Oral Daily   baclofen   10 mg Oral TID   diltiazem   120 mg Oral BID   Dimethyl Fumarate   240 mg Oral BID   dofetilide   500 mcg Oral BID   gabapentin   300 mg Oral 2 times per day   And   gabapentin   600 mg Oral QHS   potassium chloride   40 mEq Oral Once   propranolol  ER  80 mg Oral BID   sodium chloride  flush  3 mL Intravenous Q12H   Continuous Infusions:  sodium chloride      PRN Meds: sodium chloride , sodium chloride  flush   Vital Signs    Vitals:   06/02/24 1938 06/02/24 2240 06/03/24 0522 06/03/24 0657  BP: 136/68 114/67 103/72 120/84  Pulse: (!) 105 83 99   Resp:  18 19 18   Temp:  98 F (36.7 C) 97.9 F (36.6 C) 98.1 F (36.7 C)  TempSrc:  Oral Oral Oral  SpO2: 98% 98% 96% 95%  Weight:      Height:        Intake/Output Summary (Last 24 hours) at 06/03/2024 0721 Last data filed at 06/03/2024 9392 Gross per 24 hour  Intake 360 ml  Output --  Net 360 ml   Filed Weights   06/02/24 1542  Weight: 131.7 kg    Physical Exam    GEN- NAD, A&O x 3. Normal affect.  Lungs- CTAB, Normal effort.  Heart- Irregularly irregular rate and rhythm. No M/G/R GI- Soft, NT, ND Extremities- No clubbing, cyanosis, or edema Skin- no rash or lesion  Labs    CBC No results for input(s): WBC, NEUTROABS, HGB, HCT, MCV, PLT in the last 72 hours. Basic Metabolic Panel Recent Labs    87/90/74 0819 06/03/24 0432  NA 143 140  K 4.5 3.8  CL 106 104  CO2 29  30  GLUCOSE 97 93  BUN 10 8  CREATININE 0.98 0.89  CALCIUM  9.2 8.6*  MG 2.0 2.4    Telemetry    Dual ABiV pacing 80-90's with occ PVC's (personally reviewed)  Patient Profile     Dustin Gray is a 68 y.o. male with a past medical history significant for persistent atrial fibrillation.  They were admitted for tikosyn  load.   Assessment & Plan    Persistent Atrial Fibrillation Pt remains in afib on Tikosyn  500 mcg BID  Continue Eliquis  Creatinine, ser  0.89 (12/10 0432) Magnesium   2.4 (12/10 0432) Potassium3.8 (12/10 0432) Supplement K Continue Cardizem  120 mg daily   NICM  LVEF 55-60% 03/2022 -per Cardiology   Hyperthyroidism  -continue propranolol  80 mg BID  If pt does not convert chemically, plan on DCCV Thursday    For questions or updates, please contact [facility] Please consult [email or web address] for contact info under Cardiology/STEMI.  Signed, Daphne Barrack, NP-C, AGACNP-BC [facility] - Electrophysiology  06/03/2024, 7:51 AM

## 2024-06-03 NOTE — Progress Notes (Signed)
 Morning EKG reviewed     Shows pt remains in afib with borderline QTc at 490 ms.  Continue  Tikosyn  500 mcg BID.   Potassium3.8 (12/10 0432) Magnesium   2.4 (12/10 0432) Creatinine, ser  0.89 (12/10 0432)  Plan for home Friday if QTc remains stable   Daphne Barrack, NP-C, AGACNP-BC [facility] - Electrophysiology  06/03/2024, 3:24 PM

## 2024-06-03 NOTE — TOC CM/SW Note (Signed)
 Transition of Care [facility]) - Inpatient Brief Assessment   Patient Details  Name: Dustin Gray MRN: [id number] Date of Birth: 03/31/1956  Transition of Care [facility]) CM/SW Contact:    Sudie Erminio Deems, RN Phone Number: 06/03/2024, 2:19 PM   Clinical Narrative: Patient presented for Tikosyn  Load. Inpatient Case Manager spoke with the patient regarding co pay cost. Patient is agreeable to cost and would like to have the initial Rx filled via [facility] and the Rx refills 90 day supply escribed to [facility] in [place]. Patient is from home home with spouse and uses DME rollator. No further needs identified at this time.  Transition of Care Asessment: Insurance and Status: Insurance coverage has been reviewed Patient has primary care physician: Yes Home environment has been reviewed: reviewed- from home with spouse. Prior level of function:: independent with rollator. Prior/Current Home Services: No current home services Social Drivers of Health Review: SDOH reviewed no interventions necessary Readmission risk has been reviewed: Yes Transition of care needs: no transition of care needs at this time

## 2024-06-04 ENCOUNTER — Encounter (HOSPITAL_COMMUNITY)
Admission: AD | Disposition: A | Discharge status: Home / Self Care | Payer: Self-pay | Source: Ambulatory Visit | Attending: Cardiovascular Disease

## 2024-06-04 DIAGNOSIS — I4819 Other persistent atrial fibrillation: Principal | ICD-10-CM

## 2024-06-04 LAB — BASIC METABOLIC PANEL WITH GFR
Anion gap: 10 (ref 5–15)
BUN: 14 mg/dL (ref 8–23)
CO2: 24 mmol/L (ref 22–32)
Calcium: 8.6 mg/dL — ABNORMAL LOW (ref 8.9–10.3)
Chloride: 105 mmol/L (ref 98–111)
Creatinine, Ser: 1.01 mg/dL (ref 0.61–1.24)
GFR, Estimated: >60 mL/min (ref >60)
Glucose, Bld: 82 mg/dL (ref 70–99)
Potassium: 4.4 mmol/L (ref 3.5–5.1)
Sodium: 139 mmol/L (ref 135–145)

## 2024-06-04 LAB — MAGNESIUM: Magnesium: 2.2 mg/dL (ref 1.7–2.4)

## 2024-06-04 SURGERY — CARDIOVERSION (CATH LAB)
Anesthesia: General

## 2024-06-04 MED ORDER — METHIMAZOLE 2.5 MG HALF TABLET
2.5000 mg | ORAL_TABLET | ORAL | Status: DC
  Administered 2024-06-04 – 2024-06-05 (×2): 2.5 mg via ORAL
  Filled 2024-06-04 (×2): qty 1

## 2024-06-04 NOTE — Progress Notes (Addendum)
° °  Electrophysiology Rounding Note  Patient Name: Dustin Gray Date of Encounter: 06/04/2024  Primary Cardiologist: Redell Cave, MD  Electrophysiologist: Dr. Nancey    Subjective   Pt converted to sinus rhythm on Tikosyn  250 mcg BID   QTc from EKG last pm shows stable QTc at 467 ms  The patient is doing well today.  At this time, the patient denies chest pain, shortness of breath, or any new concerns.  Inpatient Medications    Scheduled Meds:  apixaban   5 mg Oral BID   atorvastatin   20 mg Oral Daily   baclofen   10 mg Oral TID   diltiazem   120 mg Oral BID   Dimethyl Fumarate   240 mg Oral BID   dofetilide   500 mcg Oral BID   gabapentin   300 mg Oral 2 times per day   And   gabapentin   600 mg Oral QHS   propranolol  ER  80 mg Oral BID   sodium chloride  flush  3 mL Intravenous Q12H   Continuous Infusions:  sodium chloride  20 mL/hr at 06/03/24 1641   PRN Meds: sodium chloride  flush   Vital Signs    Vitals:   06/03/24 2031 06/03/24 2239 06/03/24 2327 06/04/24 0358  BP: (!) 126/58 122/70 123/66 123/70  Pulse: 69  68 61  Resp: 20  16 18   Temp: 97.8 F (36.6 C)  98 F (36.7 C) 98.4 F (36.9 C)  TempSrc: Oral  Oral Oral  SpO2: 98%  100% 99%  Weight:      Height:        Intake/Output Summary (Last 24 hours) at 06/04/2024 0752 Last data filed at 06/03/2024 1800 Gross per 24 hour  Intake 326.21 ml  Output --  Net 326.21 ml   Filed Weights   06/02/24 1542  Weight: 131.7 kg    Physical Exam    GEN- NAD, A&O x 3. Normal affect.  Lungs- CTAB, Normal effort.  Heart- Regular rate and rhythm. No M/G/R GI- Soft, NT, ND Extremities- No clubbing, cyanosis, LE trace to 1+ chronic LE edema Skin- no rash or lesion  Labs    CBC No results for input(s): WBC, NEUTROABS, HGB, HCT, MCV, PLT in the last 72 hours.  Basic Metabolic Panel Recent Labs    87/89/74 0432 06/04/24 0431  NA 140 139  K 3.8 4.4  CL 104 105  CO2 30 24  GLUCOSE 93 82   BUN 8 14  CREATININE 0.89 1.01  CALCIUM  8.6* 8.6*  MG 2.4 2.2    Telemetry    AF90-100's until ~ noon on 12/10 and converted to NSR 60-70's (personally reviewed)  Patient Profile     Dustin Gray is a 68 y.o. male with a past medical history significant for persistent atrial fibrillation.  They were admitted for tikosyn  load.   Assessment & Plan    Persistent Atrial Fibrillation Pt converted to sinus rhythm on Tikosyn  500 mcg BID  Continue Eliquis  Creatinine, ser  1.01 (12/11 0431) Magnesium   2.2 (12/11 0431) Potassium4.4 (12/11 0431) No electrolyte supplementation needed Cancel DCCV as patient converted on Tikosyn    NICM  LVEF 55-60% 03/2022 -per Cardiology    Plan for home Friday if QTc remains stable.   For questions or updates, please contact [facility] Please consult [email or web address] for contact info under Cardiology/STEMI.  Signed, Daphne Barrack, NP-C, AGACNP-BC [facility] - Electrophysiology  06/04/2024, 7:52 AM

## 2024-06-04 NOTE — Plan of Care (Signed)

## 2024-06-04 NOTE — Progress Notes (Signed)
 Morning EKG reviewed     Shows remains in NSR with borderline QTc at 486 ms ms.  Continue  Tikosyn  500 mcg BID.   Potassium4.4 (12/11 0431) Magnesium   2.2 (12/11 0431) Creatinine, ser  1.01 (12/11 0431)  Plan for home Friday if QTc remains stable    Daphne Barrack, NP-C, AGACNP-BC  [facility] - Electrophysiology  06/04/2024, 10:54 AM

## 2024-06-04 NOTE — Progress Notes (Signed)
 Pharmacy: Dofetilide  (Tikosyn ) - Follow Up Assessment and Electrolyte Replacement  Pharmacy consulted to assist in monitoring and replacing electrolytes in this 68 y.o. male admitted on 06/02/2024 undergoing dofetilide  initiation. First dofetilide  dose: 06/03/24  Labs:    Component Value Date/Time   K 4.4 06/04/2024 0431   MG 2.2 06/04/2024 0431     Plan: Potassium: K >/= 4: No additional supplementation needed  Magnesium : Mg > 2: No additional supplementation needed   Dustin Gray, PharmD, BCPS, [facility] Clinical Pharmacist [phone] Please check AMION for all [facility] Pharmacy numbers 06/04/2024

## 2024-06-05 ENCOUNTER — Other Ambulatory Visit (HOSPITAL_COMMUNITY): Payer: Self-pay

## 2024-06-05 LAB — BASIC METABOLIC PANEL WITH GFR
Anion gap: 9 (ref 5–15)
BUN: 12 mg/dL (ref 8–23)
CO2: 27 mmol/L (ref 22–32)
Calcium: 8.9 mg/dL (ref 8.9–10.3)
Chloride: 104 mmol/L (ref 98–111)
Creatinine, Ser: 0.95 mg/dL (ref 0.61–1.24)
GFR, Estimated: >60 mL/min (ref >60)
Glucose, Bld: 92 mg/dL (ref 70–99)
Potassium: 4.3 mmol/L (ref 3.5–5.1)
Sodium: 140 mmol/L (ref 135–145)

## 2024-06-05 LAB — MAGNESIUM: Magnesium: 2 mg/dL (ref 1.7–2.4)

## 2024-06-05 MED ORDER — DOFETILIDE 250 MCG PO CAPS
250.0000 ug | ORAL_CAPSULE | Freq: Two times a day (BID) | ORAL | 11 refills | Status: AC
  Filled 2024-06-05: qty 60, 30d supply, fill #0

## 2024-06-05 MED ORDER — MAGNESIUM SULFATE 2 GM/50ML IV SOLN
2.0000 g | Freq: Once | INTRAVENOUS | Status: AC
  Administered 2024-06-05: 2 g via INTRAVENOUS
  Filled 2024-06-05: qty 50

## 2024-06-05 MED ORDER — DOFETILIDE 250 MCG PO CAPS: 250.0000 ug | ORAL_CAPSULE | Freq: Two times a day (BID) | ORAL | Status: DC

## 2024-06-05 NOTE — Plan of Care (Signed)
  Problem: Education: Goal: Knowledge of General Education information will improve Description: Including pain rating scale, medication(s)/side effects and non-pharmacologic comfort measures Outcome: Completed/Met   Problem: Health Behavior/Discharge Planning: Goal: Ability to manage health-related needs will improve Outcome: Completed/Met   Problem: Clinical Measurements: Goal: Ability to maintain clinical measurements within normal limits will improve Outcome: Completed/Met Goal: Will remain free from infection Outcome: Completed/Met Goal: Diagnostic test results will improve Outcome: Completed/Met Goal: Respiratory complications will improve Outcome: Completed/Met Goal: Cardiovascular complication will be avoided Outcome: Completed/Met   Problem: Activity: Goal: Risk for activity intolerance will decrease Outcome: Completed/Met   Problem: Nutrition: Goal: Adequate nutrition will be maintained Outcome: Completed/Met   Problem: Coping: Goal: Level of anxiety will decrease Outcome: Completed/Met   Problem: Elimination: Goal: Will not experience complications related to bowel motility Outcome: Completed/Met Goal: Will not experience complications related to urinary retention Outcome: Completed/Met   Problem: Pain Managment: Goal: General experience of comfort will improve and/or be controlled Outcome: Completed/Met   Problem: Safety: Goal: Ability to remain free from injury will improve Outcome: Completed/Met   Problem: Skin Integrity: Goal: Risk for impaired skin integrity will decrease Outcome: Completed/Met   Problem: Education: Goal: Knowledge of disease or condition will improve Outcome: Completed/Met Goal: Understanding of medication regimen will improve Outcome: Completed/Met Goal: Individualized Educational Video(s) Outcome: Completed/Met   Problem: Activity: Goal: Ability to tolerate increased activity will improve Outcome: Completed/Met    Problem: Cardiac: Goal: Ability to achieve and maintain adequate cardiopulmonary perfusion will improve Outcome: Completed/Met   Problem: Health Behavior/Discharge Planning: Goal: Ability to safely manage health-related needs after discharge will improve Outcome: Completed/Met

## 2024-06-05 NOTE — Progress Notes (Signed)
 Patient discharged, Important Message Letter mailed to patient.

## 2024-06-05 NOTE — Discharge Summary (Signed)
 ELECTROPHYSIOLOGY DISCHARGE SUMMARY    Patient ID: Dustin Gray,  MRN: [id number], DOB/AGE: 68-Jun-1957 68 y.o.  Admit date: 06/02/2024 Discharge date: 06/05/2024  Primary Care Physician: Hugh Charleston, MD (Inactive)  Primary Cardiologist: Redell Cave, MD  Electrophysiologist: Dr. Nancey   Primary Discharge Diagnosis:  Persistent atrial fibrillation status post Tikosyn  loading this admission  Secondary Discharge Diagnosis:  Secondary Hypercoagulable State  Allergies[1]   Procedures This Admission:  1.  Tikosyn  loading   Brief HPI: Dustin Gray is a 68 y.o. male with a past medical history as noted above.  They were referred to EP for treatment options of atrial fibrillation.  Risks, benefits, and alternatives to Tikosyn  were reviewed with the patient who wished to proceed with admission for loading.  Hospital Course:  The patient was admitted and Tikosyn  was initiated.  Renal function and electrolytes were followed during the hospitalization. The patient converted chemically and did not require cardioversion. The patients QT prolonged, requiring dose reduction to 250 mcg BID. Electrolytes remained stable. They were monitored on telemetry up to discharge. On the day of discharge, they were examined by Dr. Nancey  who considered them stable for discharge to home.  Follow-up has been arranged with the Atrial Fibrillation clinic in approximately 1 week.   Physical Exam: Vitals:   06/04/24 2255 06/05/24 0330 06/05/24 0830 06/05/24 0857  BP: 126/69 118/67 109/70 109/70  Pulse: 68 (!) 54 (!) 57 (!) 58  Resp: 16 20 16    Temp: 98.2 F (36.8 C) 97.6 F (36.4 C) (!) 97.5 F (36.4 C)   TempSrc: Oral Oral Oral   SpO2: 99% 97%    Weight:      Height:        GEN- NAD, A&O x 3. Normal affect.  Lungs- CTAB, Normal effort.  Heart- Regular rate and rhythm. No M/G/R GI- Soft, NT, ND Extremities- No clubbing, cyanosis, or edema Skin- no rash or lesion  Labs:    Lab Results  Component Value Date   WBC 7.3 02/17/2024   HGB 15.2 02/17/2024   HCT 46.0 02/17/2024   MCV 96 02/17/2024   PLT 233 02/17/2024    Recent Labs  Lab 06/05/24 0328  NA 140  K 4.3  CL 104  CO2 27  BUN 12  CREATININE 0.95  CALCIUM  8.9  GLUCOSE 92    Discharge Medications:  Allergies as of 06/05/2024       Reactions   Cephalexin Hives   Ancef [cefazolin Sodium] Rash   Cefadroxil Rash        Medication List     TAKE these medications    apixaban  5 MG Tabs tablet Commonly known as: ELIQUIS  Take 1 tablet (5 mg total) by mouth 2 (two) times daily. 2 week supply pt needs to come in for blood work.   atorvastatin  20 MG tablet Commonly known as: LIPITOR Take 20 mg by mouth daily.   baclofen  10 MG tablet Commonly known as: LIORESAL  Take 10 mg by mouth 3 (three) times daily.   CENTRUM MINIS MEN 50+ PO Take 1 tablet by mouth at bedtime.   diltiazem  120 MG 24 hr capsule Commonly known as: CARDIZEM  CD Take 1 capsule by mouth twice daily   Dimethyl Fumarate  240 MG Cpdr Take 240 mg by mouth 2 (two) times daily.   dofetilide  250 MCG capsule Commonly known as: TIKOSYN  Take 1 capsule (250 mcg total) by mouth 2 (two) times daily.   gabapentin  300 MG capsule Commonly known  as: NEURONTIN  Take 300-600 mg by mouth See admin instructions. Take 1 capsule (300 mg) by mouth in the morning, take 1 capsule (300 mg) by mouth at midday, & take 2 capsules (600 mg) by mouth at bedtime.   methimazole  5 MG tablet Commonly known as: TAPAZOLE  Take 2.5 mg by mouth daily. What changed:  when to take this additional instructions   propranolol  ER 80 MG 24 hr capsule Commonly known as: INDERAL  LA Take 1 capsule by mouth twice daily        Disposition:  Home with follow up in AF clinic in 1 week as in AVS.   Duration of Discharge Encounter:  APP time: 32 minutes  Signed, Daphne Barrack, NP-C, AGACNP-BC [facility] - Electrophysiology  06/05/2024, 12:15  PM         [1]  Allergies Allergen Reactions   Cephalexin Hives   Ancef [Cefazolin Sodium] Rash   Cefadroxil Rash

## 2024-06-05 NOTE — Progress Notes (Signed)
 Pharmacy: Dofetilide  (Tikosyn ) - Follow Up Assessment and Electrolyte Replacement  Pharmacy consulted to assist in monitoring and replacing electrolytes in this 68 y.o. male admitted on 06/02/2024 undergoing dofetilide  initiation. First dofetilide  dose: 06/03/24  Labs:    Component Value Date/Time   K 4.3 06/05/2024 0328   MG 2.0 06/05/2024 0328     Plan: Potassium: K >/= 4: No additional supplementation needed  Magnesium : Mg 1.8-2: Give Mg 2 gm IV x1    Imir Brumbach, PharmD, [place], [facility] Clinical Pharmacist [phone] Please check AMION for all [facility] Pharmacy numbers 06/05/2024

## 2024-06-05 NOTE — Care Management Important Message (Signed)
 Important Message  Patient Details  Name: Dustin Gray MRN: [id number] Date of Birth: 1956-02-20   Important Message Given:  No     Jennie Laneta Dragon 06/05/2024, 3:42 PM

## 2024-06-05 NOTE — Progress Notes (Signed)
 EKG from yesterday evening 06/04/2024 reviewed     Shows remains in NSR with stable QTc at 477 ms (Lead II QT 462 ms)  Continue  Tikosyn  500 mcg BID.   Potassium4.3 (12/12 0328) Magnesium   2.0 (12/12 0328), replace Mg+ Creatinine, ser  0.95 (12/12 0328)  Plan for home Friday if QTc remains stable    Daphne Barrack, NP-C, AGACNP-BC [facility] - Electrophysiology  06/05/2024, 6:30 AM

## 2024-06-05 NOTE — Discharge Instructions (Addendum)
 Please take your Tikosyn  every 12 hours on schedule - 8am & 8 pm.  If you are a little late it is ok to take the dose, but you would need to push back your evening dose accordingly (for example > if it is 930 am, would push the dose in the event to 9 pm and slowly work your way back toward your normal schedule).  Do not take closer together than 11 hours as it can be unsafe.   If you miss a single dose, resume with the next dose and stay on schedule.  If you were to miss two doses in a row, you should call the clinic and NOT restart the medication at home.  Call your pharmacy about two weeks before you are due to pick up your Tikosyn  (at least the first time) to alert them of the medication as it often needs to be ordered.  Please avoid medications that can prolong your QT > examples are below and include things like benadryl, tagamet, imodium (see list below).  Always check with your pharmacist for drug-drug interactions with Tikosyn .      Dofetilide  Capsules What is this medication? DOFETILIDE  (doe FET il ide) treats a fast or irregular heartbeat (arrhythmia). It works by slowing down overactive electric signals in the heart, which stabilizes your heart rhythm. It belongs to a group of medications called antiarrhythmics. This medicine may be used for other purposes; ask your health care provider or pharmacist if you have questions. COMMON BRAND NAME(S): Tikosyn  What should I tell my care team before I take this medication? They need to know if you have any of these conditions: Heart disease History of irregular heartbeat History of low levels of potassium or magnesium  in the blood Kidney disease Liver disease An unusual or allergic reaction to dofetilide , other medications, foods, dyes, or preservatives Pregnant or trying to get pregnant Breast-feeding How should I use this medication? Take this medication by mouth with a glass of water . Follow the directions on the prescription label. Do  not take with grapefruit juice. You can take it with or without food. If it upsets your stomach, take it with food. Take your medication at regular intervals. Do not take it more often than directed. Do not stop taking except on your care team's advice. A special MedGuide will be given to you by the pharmacist with each prescription and refill. Be sure to read this information carefully each time. Talk to your care team about the use of this medication in children. Special care may be needed. Overdosage: If you think you have taken too much of this medicine contact a poison control center or emergency room at once. NOTE: This medicine is only for you. Do not share this medicine with others. What if I miss a dose? If you miss a dose, skip it. Take your next dose at the normal time. Do not take extra or 2 doses at the same time to make up for the missed dose. What may interact with this medication? Do not take this medication with any of the following: Benadryl (Diphenhydramine) Cimetidine (Tagamet) Cisapride Dolutegravir Dronedarone Erdafitinib Hydrochlorothiazide Immodium Ketoconazole Megestrol Pimozide Prochlorperazine Thioridazine Trimethoprim Verapamil This medication may also interact with the following: Amiloride Cannabinoids Certain antibiotics like erythromycin or clarithromycin Certain antiviral medications for HIV or hepatitis Certain medications for depression, anxiety, or psychotic disorders Digoxin  Diltiazem  Grapefruit juice Metformin Nefazodone Other medications that prolong the QT interval (an abnormal heart rhythm) Quinine Triamterene Zafirlukast Ziprasidone This list  may not describe all possible interactions. Give your health care provider a list of all the medicines, herbs, non-prescription drugs, or dietary supplements you use. Also tell them if you smoke, drink alcohol, or use illegal drugs. Some items may interact with your medicine. What should I watch  for while using this medication? Your condition will be monitored carefully while you are receiving this medication. What side effects may I notice from receiving this medication? Side effects that you should report to your care team as soon as possible: Allergic reactions--skin rash, itching, hives, swelling of the face, lips, tongue, or throat Chest pain Heart rhythm changes--fast or irregular heartbeat, dizziness, feeling faint or lightheaded, chest pain, trouble breathing Side effects that usually do not require medical attention (report to your care team if they continue or are bothersome): Dizziness Headache Nausea Stomach pain Trouble sleeping This list may not describe all possible side effects. Call your doctor for medical advice about side effects. You may report side effects to FDA at 1-800-FDA-1088. Where should I keep my medication? Keep out of the reach of children. Store at room temperature between 15 and 30 degrees C (59 and 86 degrees F). Throw away any unused medication after the expiration date. NOTE: This sheet is a summary. It may not cover all possible information. If you have questions about this medicine, talk to your doctor, pharmacist, or health care provider.  2024 Elsevier/Gold Standard (2021-05-12 00:00:00)

## 2024-06-09 ENCOUNTER — Telehealth: Payer: Self-pay | Admitting: Cardiology

## 2024-06-09 MED ORDER — DILTIAZEM HCL ER COATED BEADS 120 MG PO CP24: 120.0000 mg | ORAL_CAPSULE | Freq: Two times a day (BID) | ORAL | 1 refills | Status: AC

## 2024-06-09 NOTE — Telephone Encounter (Signed)
 Refill sent

## 2024-06-09 NOTE — Telephone Encounter (Signed)
°*  STAT* If patient is at the pharmacy, call can be transferred to refill team.   1. Which medications need to be refilled? (please list name of each medication and dose if known)  diltiazem  (CARDIZEM  CD) 120 MG 24 hr capsule   2. Which pharmacy/location (including street and city if local pharmacy) is medication to be sent to? [facility] - [address] [place], [place] [address]  3. Do they need a 30 day or 90 day supply?  90 day supply

## 2024-06-15 ENCOUNTER — Encounter (HOSPITAL_COMMUNITY): Payer: Self-pay | Admitting: Internal Medicine

## 2024-06-15 ENCOUNTER — Ambulatory Visit (HOSPITAL_COMMUNITY)
Admit: 2024-06-15 | Discharge: 2024-06-15 | Disposition: A | Discharge status: Home / Self Care | Attending: Internal Medicine | Admitting: Internal Medicine

## 2024-06-15 VITALS — BP 138/82 | HR 69 | Ht 74.0 in | Wt 289.4 lb

## 2024-06-15 DIAGNOSIS — D6859 Other primary thrombophilia: Secondary | ICD-10-CM

## 2024-06-15 DIAGNOSIS — Z5181 Encounter for therapeutic drug level monitoring: Secondary | ICD-10-CM

## 2024-06-15 DIAGNOSIS — Z79899 Other long term (current) drug therapy: Secondary | ICD-10-CM | POA: Diagnosis not present

## 2024-06-15 DIAGNOSIS — I4819 Other persistent atrial fibrillation: Secondary | ICD-10-CM | POA: Diagnosis not present

## 2024-06-15 NOTE — Progress Notes (Signed)
 "  Primary Care Physician: Hugh Charleston, MD (Inactive) Primary Cardiologist: Redell Cave, MD Electrophysiologist: OLE ONEIDA HOLTS, MD  Referring Physician: Dr. Holts Laurel SHAUNNA Dustin Gray is a 68 y.o. male with a history of paroxysmal AF (on Eliquis ), multiple sclerosis, lymphedema, NICM, hyperthyroidism, who presents for follow up in the [facility].  The patient was initially diagnosed with A-fib in 2021 and was managed by Dr. Cave.  He was diagnosed with hyperthyroidism and started on propranolol  as well as Eliquis  for planned cardioversion that took place on 10/2019 that was unsuccessful.  2D echo was completed that showed normal EF of 55-60% and patient had improvement to heart rate.  He was seen in follow-up in 2023 and atrial fibrillation and underwent successful DCCV on 11/22/2021.  2D echo was completed on 08/2021 showing reduced EF of 40-45%.  He was referred to Dr. Holts and was seen on 02/21/2022 to determine candidacy for ablation or rhythm management.  The plan was made to pursue rhythm management at that time.  Repeat 2D echo showed normalized EF of 55 to 60% on 03/2022.  He was seen By Dr. Daleen on 02/27/2024 noted to be in AF.  He was scheduled for DCCV but was canceled the day of due to conversion to sinus rhythm spontaneously.  He was last seen by Dr. Holts on 03/25/2024 and was noted to be back in atrial fibrillation.  Treatment options were discussed at that time with rhythm control options being amiodarone and Tikosyn .  He has a history of thyroid  dysfunction therefore Tikosyn  was recommended and patient presents today for pre-Tikosyn  visit.  Follow-up 06/15/2024 for Tikosyn  surveillance.  S/p Tikosyn  admission 12/9 - 06/13/2024.  The patient converted chemically to SR and did not require cardioversion.  QT prolonged during hospital stay requiring dose reduction to 250 mcg twice daily.  He has not missed any doses of Tikosyn  since hospital  discharge.  He feels well and has not had any A-fib since discharge.  No missed doses of Eliquis .  Today, he denies symptoms of palpitations, chest pain, shortness of breath, orthopnea, PND, lower extremity edema, dizziness, presyncope, syncope, snoring, daytime somnolence, bleeding, or neurologic sequela. The patient is tolerating medications without difficulties and is otherwise without complaint today.    Atrial Fibrillation Risk Factors:  Doesd not have a history of sleep apnea  Atrial Fibrillation Management history:  Previous antiarrhythmic drugs: Tikosyn  Previous cardioversions: Unsuccessful attempt 10/2019  Previous ablations: None Anticoagulation history: Eliquis   ROS- All systems are reviewed and negative except as per the HPI above.  Past Medical History:  Diagnosis Date   Abscess    groin   Arthritis    lower spine   Dysrhythmia    Erectile dysfunction    Hyperthyroidism    Low testosterone    Lumbar herniated disc    L5   Multiple sclerosis    Spinal stenosis    Staph aureus infection     Current Outpatient Medications  Medication Sig Dispense Refill   apixaban  (ELIQUIS ) 5 MG TABS tablet Take 1 tablet (5 mg total) by mouth 2 (two) times daily. 2 week supply pt needs to come in for blood work. 90 tablet 1   atorvastatin  (LIPITOR) 20 MG tablet Take 20 mg by mouth daily.     baclofen  (LIORESAL ) 10 MG tablet Take 10 mg by mouth 3 (three) times daily.     diltiazem  (CARDIZEM  CD) 120 MG 24 hr capsule Take 1 capsule (120 mg total)  by mouth 2 (two) times daily. 180 capsule 1   Dimethyl Fumarate  240 MG CPDR Take 240 mg by mouth 2 (two) times daily.      dofetilide  (TIKOSYN ) 250 MCG capsule Take 1 capsule (250 mcg total) by mouth 2 (two) times daily. 60 capsule 11   gabapentin  (NEURONTIN ) 300 MG capsule Take 300-600 mg by mouth See admin instructions. Take 1 capsule (300 mg) by mouth in the morning, take 1 capsule (300 mg) by mouth at midday, & take 2 capsules (600 mg) by  mouth at bedtime.     methimazole  (TAPAZOLE ) 5 MG tablet Take 2.5 mg by mouth daily. (Patient taking differently: Take 2.5 mg by mouth See admin instructions. Take 0.5 tablet by mouth every day except on Wednesdays and Saturdays.)     Multiple Vitamins-Minerals (CENTRUM MINIS MEN 50+ PO) Take 1 tablet by mouth at bedtime.     propranolol  ER (INDERAL  LA) 80 MG 24 hr capsule Take 1 capsule by mouth twice daily 180 capsule 0   No current facility-administered medications for this encounter.    Physical Exam: BP 138/82   Pulse 69   Ht 6' 2 (1.88 m)   Wt 131.3 kg   BMI 37.16 kg/m   GEN- The patient is well appearing, alert and oriented x 3 today.   Neck - no JVD or carotid bruit noted Lungs- Clear to ausculation bilaterally, normal work of breathing Heart- Regular rate and rhythm, no murmurs, rubs or gallops, PMI not laterally displaced Extremities- no clubbing, cyanosis, or edema Skin - no rash or ecchymosis noted   Wt Readings from Last 3 Encounters:  06/15/24 131.3 kg  06/02/24 131.7 kg  06/02/24 131.5 kg     EKG today demonstrates:  EKG Interpretation Date/Time:  Monday June 15 2024 09:57:41 EST Ventricular Rate:  69 PR Interval:  148 QRS Duration:  72 QT Interval:  440 QTC Calculation: 471 R Axis:   -11  Text Interpretation: Sinus rhythm with Premature atrial complexes Otherwise normal ECG When compared with ECG of 05-Jun-2024 10:57, Premature atrial complexes are now Present Confirmed by Terra Pac ([id number]) on 06/15/2024 10:13:12 AM    Echo Completed 03/28/2022  1. Left ventricular ejection fraction, by estimation, is 55 to 60%. The  left ventricle has normal function. The left ventricle has no regional  wall motion abnormalities. Left ventricular diastolic parameters are  indeterminate.   2. Right ventricular systolic function is normal. The right ventricular  size is normal.   3. Right atrial size was mildly dilated.   4. The mitral valve is normal in  structure. No evidence of mitral valve  regurgitation.   5. The aortic valve was not well visualized. Aortic valve regurgitation  is not visualized. Aortic valve sclerosis/calcification is present,  without any evidence of aortic stenosis.   6. Aortic dilatation noted. There is mild dilatation of the aortic root,  measuring 39 mm.   7. The inferior vena cava is normal in size with greater than 50%  respiratory variability, suggesting right atrial pressure of 3 mmHg.   CHA2DS2-VASc Score = 1  The patient's score is based upon: CHF History: 0 HTN History: 0 Diabetes History: 0 Stroke History: 0 Vascular Disease History: 0 Age Score: 1 Gender Score: 0       ASSESSMENT AND PLAN: Persistent Atrial Fibrillation (ICD10:  I48.19) The patient's CHA2DS2-VASc score is 1, indicating a 0.6% annual risk of stroke.  S/p Tikosyn  admission 12/9 - 06/13/2024.  Patient is currently in NSR.  He is happy with current management so we will not make any changes at this time.  Continue diltiazem  120 mg daily.  Continue propranolol  80 mg twice daily.  Tikosyn  teaching revisited.  High risk medication monitoring (ICD10: [id number]) Patient requires ongoing monitoring for anti-arrhythmic medication which has the potential to cause life threatening arrhythmias or AV block. Qtc stable. Continue Tikosyn  250 mcg twice daily. BMET and mag level drawn today.  NICM: - Most recent 2D echo completed on 10//23 showing stable EF of 55 to 60%  Hyperthyroidism: - Continue current treatment plan per endocrinology.    Follow up as scheduled in 1 month for Tikosyn  surveillance.   Dorn Heinrich, [facility] [address] [place], [place] [address] [phone] "

## 2024-06-16 ENCOUNTER — Ambulatory Visit (HOSPITAL_COMMUNITY): Payer: Self-pay | Admitting: Internal Medicine

## 2024-06-16 LAB — BASIC METABOLIC PANEL WITH GFR
BUN/Creatinine Ratio: 14 (ref 10–24)
BUN: 13 mg/dL (ref 8–27)
CO2: 24 mmol/L (ref 20–29)
Calcium: 9 mg/dL (ref 8.6–10.2)
Chloride: 102 mmol/L (ref 96–106)
Creatinine, Ser: 0.92 mg/dL (ref 0.76–1.27)
Glucose: 89 mg/dL (ref 70–99)
Potassium: 4.5 mmol/L (ref 3.5–5.2)
Sodium: 141 mmol/L (ref 134–144)
eGFR: 91 mL/min/1.73

## 2024-06-16 LAB — MAGNESIUM: Magnesium: 2 mg/dL (ref 1.6–2.3)

## 2024-06-23 ENCOUNTER — Other Ambulatory Visit (HOSPITAL_COMMUNITY): Payer: Self-pay

## 2024-07-06 NOTE — Progress Notes (Unsigned)
" °  Cardiology Office Note:  .   Date:  07/06/2024  ID:  Dustin Gray, DOB 10-11-1955, MRN [id number] PCP: Hugh Charleston, MD (Inactive)  [facility] Providers Cardiologist:  Redell Cave, MD Electrophysiologist:  OLE ONEIDA HOLTS, MD (Inactive) >> Dr. Nancey  History of Present Illness: Dustin   [name] is a 69 y.o. male w/PMHx of  MS, lymphedema, hyperthyroid/Grave's Chronic CHF (systolic > HFrecEF, suspect tachy-mediated) AFib  Referred to EP for AFib management strategies, saw Dr. Holts, back in Oct 2025, given thyroid  disease, and young age > avoid amiodarone, planned for Tikosyn  Discussed personal stressors, in the midst of selling 2 homes  Admitted for Tikosyn  06/02/24, converted with drug > QT did lenthen out requiring dose reductions Discharged 06/05/24 on dose  Saw the Afib clinic 12/22, no symptoms of AFib, tolerating Tikosyn  well. Stable QTc  Today's visit is scheduled as his 41mo post tikosyn  load ROS:   He is doing well No symptoms of his AFib (fatigue, DOE) No CP, palpitations No near syncope or syncope Reports excellent medication compliance No bleeding or signs of bleeding  He is in the process of re-locating to [facility] though plans to keep EP team here and his neurology team at [facility], to commute to   Arrhythmia/AAD hx AFib 1st found 2021 Tikosyn  started Dec 2025  Studies Reviewed: Dustin    EKG done today and reviewed by myself:  SB 59bpm, QTc   Echo Completed 03/28/2022 1. Left ventricular ejection fraction, by estimation, is 55 to 60%. The  left ventricle has normal function. The left ventricle has no regional  wall motion abnormalities. Left ventricular diastolic parameters are  indeterminate.   2. Right ventricular systolic function is normal. The right ventricular  size is normal.   3. Right atrial size was mildly dilated.   4. The mitral valve is normal in structure. No evidence of mitral valve  regurgitation.   5. The  aortic valve was not well visualized. Aortic valve regurgitation  is not visualized. Aortic valve sclerosis/calcification is present,  without any evidence of aortic stenosis.   6. Aortic dilatation noted. There is mild dilatation of the aortic root,  measuring 39 mm.   7. The inferior vena cava is normal in size with greater than 50%  respiratory variability, suggesting right atrial pressure of 3 mmHg.    Risk Assessment/Calculations:    Physical Exam:   VS:  There were no vitals taken for this visit.   Wt Readings from Last 3 Encounters:  06/15/24 289 lb 6.4 oz (131.3 kg)  06/02/24 290 lb 6.4 oz (131.7 kg)  06/02/24 290 lb (131.5 kg)    GEN: Well nourished, well developed in no acute distress NECK: No JVD; No carotid bruits CARDIAC: RRR, no murmurs, rubs, gallops RESPIRATORY:  CTA b/l without rales, wheezing or rhonchi  ABDOMEN: Soft, non-tender, non-distended EXTREMITIES: No edema; No deformity   ASSESSMENT AND PLAN: .    persistent AFib CHA2DS2Vasc is 2, on Eliquis , appropriately dosed Tikosyn  w/stable QTc meds reviewed teaching re-enforced No burden by symptoms Labs today  DCM Suspect to have been tachy-mediated Recovered LVEF by his last echo 2023 no symptoms or exam findings of volume OL C/w Dr. Marcine  Secondary hypercoagulable state 2/2 AFib   Dispo: back in 65mo, sooner if needed    Signed, Charlies Macario Arthur, PA-C   "

## 2024-07-07 ENCOUNTER — Ambulatory Visit: Attending: Physician Assistant | Admitting: Physician Assistant

## 2024-07-07 VITALS — BP 118/62 | HR 59 | Ht 74.0 in | Wt 288.0 lb

## 2024-07-07 DIAGNOSIS — I4819 Other persistent atrial fibrillation: Secondary | ICD-10-CM

## 2024-07-07 DIAGNOSIS — Z5181 Encounter for therapeutic drug level monitoring: Secondary | ICD-10-CM

## 2024-07-07 DIAGNOSIS — D6869 Other thrombophilia: Secondary | ICD-10-CM | POA: Diagnosis not present

## 2024-07-07 DIAGNOSIS — Z79899 Other long term (current) drug therapy: Secondary | ICD-10-CM

## 2024-07-07 DIAGNOSIS — I420 Dilated cardiomyopathy: Secondary | ICD-10-CM

## 2024-07-07 LAB — BASIC METABOLIC PANEL WITH GFR
BUN/Creatinine Ratio: 11 (ref 10–24)
BUN: 11 mg/dL (ref 8–27)
CO2: 23 mmol/L (ref 20–29)
Calcium: 9.6 mg/dL (ref 8.6–10.2)
Chloride: 104 mmol/L (ref 96–106)
Creatinine, Ser: 0.98 mg/dL (ref 0.76–1.27)
Glucose: 79 mg/dL (ref 70–99)
Potassium: 4.8 mmol/L (ref 3.5–5.2)
Sodium: 144 mmol/L (ref 134–144)
eGFR: 84 mL/min/1.73

## 2024-07-07 LAB — MAGNESIUM: Magnesium: 2.1 mg/dL (ref 1.6–2.3)

## 2024-07-07 NOTE — Patient Instructions (Signed)
 Medication Instructions:   Your physician recommends that you continue on your current medications as directed. Please refer to the Current Medication list given to you today.  *If you need a refill on your cardiac medications before your next appointment, please call your pharmacy*   Lab Work   PLEASE GO DOWN STAIRS  [facility]  FIRST FLOOR   ( GET OFF ELEVATORS WALK TOWARDS WAITING AREA LAB LOCATED BY PHARMACY):   BMET AND MAG TODAY    If you have labs (blood work) drawn today and your tests are completely normal, you will receive your results only by: MyChart Message (if you have MyChart) OR A paper copy in the mail If you have any lab test that is abnormal or     Testing/Procedures:  NONE ORDERED  TODAY     Follow-Up: At [facility], you and your health needs are our priority.  As part of our continuing mission to provide you with exceptional heart care, our providers are all part of one team.  This team includes your primary Cardiologist (physician) and Advanced Practice Providers or APPs (Physician Assistants and Nurse Practitioners) who all work together to provide you with the care you need, when you need it.  Your next appointment:   4 month(s)  :   Eulas Furbish, MD or Charlies Arthur, PA-C  ( CONTACT  CASSIE HALL/ ANGELINE HAMMER FOR EP SCHEDULING ISSUES )   We recommend signing up for the patient portal called MyChart.  Sign up information is provided on this After Visit Summary.  MyChart is used to connect with patients for Virtual Visits (Telemedicine).  Patients are able to view lab/test results, encounter notes, upcoming appointments, etc.  Non-urgent messages can be sent to your provider as well.   To learn more about what you can do with MyChart, go to [email or web address].   Other Instructions

## 2024-07-08 ENCOUNTER — Ambulatory Visit: Payer: Self-pay | Admitting: Physician Assistant

## 2024-07-30 ENCOUNTER — Ambulatory Visit: Admitting: Cardiology

## 2024-07-30 ENCOUNTER — Encounter: Payer: Self-pay | Admitting: Cardiology

## 2024-07-30 VITALS — BP 120/74 | HR 65 | Ht 74.0 in | Wt 293.4 lb

## 2024-07-30 DIAGNOSIS — E0590 Thyrotoxicosis, unspecified without thyrotoxic crisis or storm: Secondary | ICD-10-CM

## 2024-07-30 DIAGNOSIS — I5022 Chronic systolic (congestive) heart failure: Secondary | ICD-10-CM

## 2024-07-30 DIAGNOSIS — I4819 Other persistent atrial fibrillation: Secondary | ICD-10-CM | POA: Diagnosis not present

## 2024-07-30 NOTE — Patient Instructions (Signed)

## 2024-07-30 NOTE — Progress Notes (Signed)
 " Cardiology Office Note:    Date:  07/30/2024   ID:  Dustin Gray, DOB Oct 09, 1955, MRN [id number]  PCP:  Auston Opal, DO  Cardiologist:  Redell Cave, MD  Electrophysiologist:  OLE ONEIDA HOLTS, MD (Inactive)   Referring MD: No ref. provider found   Chief Complaint  Patient presents with   Follow-up    Pt doing good.    History of Present Illness:    Dustin Gray is a 70 y.o. male with a hx of persistent atrial fibrillation s/p DCCV 5/23, multiple sclerosis, lymphedema, hyperthyroidism/Graves' disease, who presents for follow-up.   Patient is doing and feeling well, denies chest pain.  Started on Tikosyn  per EP team, tolerating Eliquis  with no bleeding issues.  Compliant medications as prescribed.  Moving to [place]  but would like to maintain cardiac care with our practice.   Prior notes/studies Echo 10/23 EF 55 to 60% Echo 08/2021 EF 40 to 45% Patient originally seen in the hospital for atrial fibrillation with rapid ventricular response.  He was managed with diltiazem  and Eliquis .  He was subsequently diagnosed with hyperthyroidism and propanolol ER added to his medical regimen.   Echocardiogram on 09/2019 showed normal ejection fraction with EF 55 to 60%, left atrial size was also normal.   He was started on anticoagulation due to plan for cardioversion.  Cardioversion was attempted but unsuccessful.  His heart rates have improved when hyperthyroidism is controlled.  Past Medical History:  Diagnosis Date   Abscess    groin   Arthritis    lower spine   Dysrhythmia    Erectile dysfunction    Hyperthyroidism    Low testosterone    Lumbar herniated disc    L5   Multiple sclerosis    Spinal stenosis    Staph aureus infection     Past Surgical History:  Procedure Laterality Date   CARDIOVERSION N/A 11/22/2021   Procedure: CARDIOVERSION;  Surgeon: Cave Redell, MD;  Location: [facility];  Service: Cardiovascular;  Laterality: N/A;   CARDIOVERSION  N/A 02/28/2024   Procedure: CARDIOVERSION;  Surgeon: Cave Redell, MD;  Location: [facility] OR[facility];  Service: Cardiovascular;  Laterality: N/A;   COLONOSCOPY WITH PROPOFOL  N/A 07/04/2016   Procedure: COLONOSCOPY WITH PROPOFOL ;  Surgeon: Rogelia Copping, MD;  Location: [facility];  Service: Endoscopy;  Laterality: N/A;   FINGER SURGERY  1964   MASS EXCISION  1960   POLYPECTOMY  07/04/2016   Procedure: POLYPECTOMY;  Surgeon: Rogelia Copping, MD;  Location: [facility];  Service: Endoscopy;;   TONSILLECTOMY      Current Medications: Current Meds  Medication Sig   apixaban  (ELIQUIS ) 5 MG TABS tablet Take 1 tablet (5 mg total) by mouth 2 (two) times daily. 2 week supply pt needs to come in for blood work.   atorvastatin  (LIPITOR) 20 MG tablet Take 20 mg by mouth daily.   baclofen  (LIORESAL ) 10 MG tablet Take 10 mg by mouth 3 (three) times daily.   diltiazem  (CARDIZEM  CD) 120 MG 24 hr capsule Take 1 capsule (120 mg total) by mouth 2 (two) times daily.   Dimethyl Fumarate  240 MG CPDR Take 240 mg by mouth 2 (two) times daily.    dofetilide  (TIKOSYN ) 250 MCG capsule Take 1 capsule (250 mcg total) by mouth 2 (two) times daily.   gabapentin  (NEURONTIN ) 300 MG capsule Take 300-600 mg by mouth See admin instructions. Take 1 capsule (300 mg) by mouth in the morning, take 1 capsule (300 mg) by mouth at  midday, & take 2 capsules (600 mg) by mouth at bedtime.   methimazole  (TAPAZOLE ) 5 MG tablet Take 2.5 mg by mouth daily. (Patient taking differently: Take 2.5 mg by mouth See admin instructions. Take 0.5 tablet by mouth every day except on Wednesdays and Saturdays.)   Multiple Vitamins-Minerals (CENTRUM MINIS MEN 50+ PO) Take 1 tablet by mouth at bedtime.   propranolol  ER (INDERAL  LA) 80 MG 24 hr capsule Take 1 capsule by mouth twice daily     Allergies:   Cephalexin, Ancef [cefazolin sodium], and Cefadroxil   Social History   Socioeconomic History   Marital status: Legally Separated    Spouse  name: Not on file   Number of children: Not on file   Years of education: Not on file   Highest education level: Not on file  Occupational History   Not on file  Tobacco Use   Smoking status: Former   Smokeless tobacco: Never   Tobacco comments:    Former smoker 06/15/24  Vaping Use   Vaping status: Never Used  Substance and Sexual Activity   Alcohol use: Not Currently    Comment: 2 drinks/month   Drug use: No   Sexual activity: Not on file  Other Topics Concern   Not on file  Social History Narrative   Not on file   Social Drivers of Health   Tobacco Use: Medium Risk (07/30/2024)   Patient History    Smoking Tobacco Use: Former    Smokeless Tobacco Use: Never    Passive Exposure: Not on Actuary Strain: Not on file  Food Insecurity: No Food Insecurity (06/02/2024)   Epic    Worried About Programme Researcher, Broadcasting/film/video in the Last Year: Never true    Ran Out of Food in the Last Year: Never true  Transportation Needs: No Transportation Needs (06/02/2024)   Epic    Lack of Transportation (Medical): No    Lack of Transportation (Non-Medical): No  Physical Activity: Not on file  Stress: Not on file  Social Connections: Unknown (06/02/2024)   Social Connection and Isolation Panel    Frequency of Communication with Friends and Family: Once a week    Frequency of Social Gatherings with Friends and Family: Patient declined    Attends Religious Services: 1 to 4 times per year    Active Member of [employer] or Organizations: No    Attends Banker Meetings: Never    Marital Status: Married  Depression (PHQ2-9): Not on file  Alcohol Screen: Not on file  Housing: Low Risk (06/02/2024)   Epic    Unable to Pay for Housing in the Last Year: No    Number of Times Moved in the Last Year: 0    Homeless in the Last Year: No  Utilities: Not At Risk (06/02/2024)   Epic    Threatened with loss of utilities: No  Health Literacy: Not on file     Family History: The  patient's family history includes Diabetes in his father.  ROS:   Please see the history of present illness.     All other systems reviewed and are negative.  EKGs/Labs/Other Studies Reviewed:    The following studies were reviewed today:        Recent Labs: 02/17/2024: Hemoglobin 15.2; Platelets 233 07/07/2024: BUN 11; Creatinine, Ser 0.98; Magnesium  2.1; Potassium 4.8; Sodium 144  Recent Lipid Panel    Component Value Date/Time   CHOL 108 10/01/2019 0358   TRIG 81 10/01/2019  0358   HDL 35 (L) 10/01/2019 0358   CHOLHDL 3.1 10/01/2019 0358   VLDL 16 10/01/2019 0358   LDLCALC 57 10/01/2019 0358    Physical Exam:    VS:  BP 120/74 (BP Location: Left Arm, Patient Position: Sitting, Cuff Size: Normal)   Pulse 65   Ht 6' 2 (1.88 m)   Wt 293 lb 6.4 oz (133.1 kg)   SpO2 98%   BMI 37.67 kg/m     Wt Readings from Last 3 Encounters:  07/30/24 293 lb 6.4 oz (133.1 kg)  07/07/24 288 lb (130.6 kg)  06/15/24 289 lb 6.4 oz (131.3 kg)     GEN:  Well nourished, well developed in no acute distress HEENT: Normal NECK: No JVD; No carotid bruits CARDIAC: Regular rate and rhythm RESPIRATORY:  Clear to auscultation without rales, wheezing or rhonchi  ABDOMEN: Soft, non-tender, non-distended MUSCULOSKELETAL:  no edema; No deformity  SKIN: Warm and dry NEUROLOGIC:  Alert and oriented x 3 PSYCHIATRIC:  Normal affect   ASSESSMENT:    1. Persistent atrial fibrillation (HCC)   2. Chronic systolic heart failure (HCC)   3. Hyperthyroidism    PLAN:    In order of problems listed above:  Persistent atrial fibrillation.  S/p DC cardioversion 11/22/2021.  On Tikosyn  250 mcg twice daily.  Rhythm appears regular on exam. Continue Cardizem  CD 120 mg twice daily,  propranolol  ER 80 mg twice daily, Eliquis  5 mg twice daily.   Cardiomyopathy (initial EF 40 to 45%, echo 10/23 EF 55-60%).  Likely tachycardia/A-fib induced.  Continue Cardizem  and Inderal  as above. hyperthyroidism.  On  methimazole . management as per endocrinology.  Follow-up in 6 months        Medication Adjustments/Labs and Tests Ordered: Current medicines are reviewed at length with the patient today.  Concerns regarding medicines are outlined above.  No orders of the defined types were placed in this encounter.    No orders of the defined types were placed in this encounter.   Patient Instructions  Medication Instructions:  Your physician recommends that you continue on your current medications as directed. Please refer to the Current Medication list given to you today.   *If you need a refill on your cardiac medications before your next appointment, please call your pharmacy*  Lab Work: No labs ordered today  If you have labs (blood work) drawn today and your tests are completely normal, you will receive your results only by: MyChart Message (if you have MyChart) OR A paper copy in the mail If you have any lab test that is abnormal or we need to change your treatment, we will call you to review the results.  Testing/Procedures: No test ordered today   Follow-Up: At [facility], you and your health needs are our priority.  As part of our continuing mission to provide you with exceptional heart care, our providers are all part of one team.  This team includes your primary Cardiologist (physician) and Advanced Practice Providers or APPs (Physician Assistants and Nurse Practitioners) who all work together to provide you with the care you need, when you need it.  Your next appointment:   6 month(s)  Provider:   You may see Redell Cave, MD or one of the following Advanced Practice Providers on your designated Care Team:   Lonni Meager, NP Lesley Maffucci, PA-C Bernardino Bring, PA-C Cadence Cannondale, PA-C Tylene Lunch, NP Barnie Hila, NP    We recommend signing up for the patient portal called MyChart.  Sign up information is provided on this After Visit Summary.   MyChart is used to connect with patients for Virtual Visits (Telemedicine).  Patients are able to view lab/test results, encounter notes, upcoming appointments, etc.  Non-urgent messages can be sent to your provider as well.   To learn more about what you can do with MyChart, go to [email or web address].              Signed, Redell Cave, MD  07/30/2024 12:25 PM    [facility]  "

## 2024-11-04 ENCOUNTER — Ambulatory Visit: Admitting: Physician Assistant
